# Patient Record
Sex: Female | Born: 1975 | Race: Black or African American | Hispanic: No | Marital: Single | Smoking: Never smoker
Health system: Southern US, Community
[De-identification: ages and names within clinical notes are randomized; demographics above are authoritative.]

## PROBLEM LIST (undated history)

## (undated) DIAGNOSIS — E119 Type 2 diabetes mellitus without complications: Secondary | ICD-10-CM

## (undated) DIAGNOSIS — G629 Polyneuropathy, unspecified: Secondary | ICD-10-CM

## (undated) DIAGNOSIS — I158 Other secondary hypertension: Secondary | ICD-10-CM

## (undated) DIAGNOSIS — I2510 Atherosclerotic heart disease of native coronary artery without angina pectoris: Secondary | ICD-10-CM

## (undated) DIAGNOSIS — E785 Hyperlipidemia, unspecified: Secondary | ICD-10-CM

## (undated) DIAGNOSIS — F32A Depression, unspecified: Secondary | ICD-10-CM

## (undated) DIAGNOSIS — N186 End stage renal disease: Secondary | ICD-10-CM

## (undated) DIAGNOSIS — N920 Excessive and frequent menstruation with regular cycle: Secondary | ICD-10-CM

## (undated) DIAGNOSIS — R0789 Other chest pain: Secondary | ICD-10-CM

## (undated) DIAGNOSIS — F329 Major depressive disorder, single episode, unspecified: Secondary | ICD-10-CM

## (undated) DIAGNOSIS — G4730 Sleep apnea, unspecified: Secondary | ICD-10-CM

## (undated) DIAGNOSIS — K3184 Gastroparesis: Secondary | ICD-10-CM

## (undated) DIAGNOSIS — E0590 Thyrotoxicosis, unspecified without thyrotoxic crisis or storm: Secondary | ICD-10-CM

## (undated) DIAGNOSIS — Z794 Long term (current) use of insulin: Secondary | ICD-10-CM

## (undated) DIAGNOSIS — R112 Nausea with vomiting, unspecified: Secondary | ICD-10-CM

## (undated) DIAGNOSIS — T7840XA Allergy, unspecified, initial encounter: Secondary | ICD-10-CM

## (undated) DIAGNOSIS — N189 Chronic kidney disease, unspecified: Secondary | ICD-10-CM

## (undated) DIAGNOSIS — F419 Anxiety disorder, unspecified: Secondary | ICD-10-CM

## (undated) DIAGNOSIS — Z992 Dependence on renal dialysis: Secondary | ICD-10-CM

## (undated) DIAGNOSIS — D631 Anemia in chronic kidney disease: Secondary | ICD-10-CM

## (undated) DIAGNOSIS — Z9889 Other specified postprocedural states: Secondary | ICD-10-CM

## (undated) DIAGNOSIS — N2581 Secondary hyperparathyroidism of renal origin: Secondary | ICD-10-CM

## (undated) DIAGNOSIS — K219 Gastro-esophageal reflux disease without esophagitis: Secondary | ICD-10-CM

## (undated) DIAGNOSIS — I770 Arteriovenous fistula, acquired: Secondary | ICD-10-CM

## (undated) DIAGNOSIS — Z8701 Personal history of pneumonia (recurrent): Secondary | ICD-10-CM

## (undated) DIAGNOSIS — Z01818 Encounter for other preprocedural examination: Secondary | ICD-10-CM

## (undated) HISTORY — PX: POLYPECTOMY: SHX149

## (undated) HISTORY — PX: COLONOSCOPY: SHX174

## (undated) HISTORY — DX: End stage renal disease: N18.6

## (undated) HISTORY — DX: Hyperlipidemia, unspecified: E78.5

## (undated) HISTORY — DX: Polyneuropathy, unspecified: G62.9

## (undated) HISTORY — PX: FEMUR IM NAIL: SHX1597

## (undated) HISTORY — DX: Gastro-esophageal reflux disease without esophagitis: K21.9

## (undated) HISTORY — PX: FOOT SURGERY: SHX648

## (undated) HISTORY — DX: Anxiety disorder, unspecified: F41.9

## (undated) HISTORY — PX: NEPHRECTOMY TRANSPLANTED ORGAN: SUR880

## (undated) HISTORY — DX: Allergy, unspecified, initial encounter: T78.40XA

## (undated) HISTORY — PX: CARDIOVASCULAR STRESS TEST: SHX262

## (undated) HISTORY — PX: DOBUTAMINE STRESS ECHO: SHX5426

## (undated) HISTORY — DX: Sleep apnea, unspecified: G47.30

## (undated) HISTORY — DX: Gastroparesis: K31.84

## (undated) HISTORY — DX: End stage renal disease: Z99.2

---
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
## 1997-07-15 ENCOUNTER — Inpatient Hospital Stay (HOSPITAL_COMMUNITY): Admission: AD | Admit: 1997-07-15 | Discharge: 1997-07-15 | Payer: Self-pay | Admitting: *Deleted

## 1997-10-11 ENCOUNTER — Inpatient Hospital Stay (HOSPITAL_COMMUNITY): Admission: RE | Admit: 1997-10-11 | Discharge: 1997-10-11 | Payer: Self-pay | Admitting: *Deleted

## 1998-03-24 ENCOUNTER — Inpatient Hospital Stay (HOSPITAL_COMMUNITY): Admission: AD | Admit: 1998-03-24 | Discharge: 1998-03-24 | Payer: Self-pay | Admitting: *Deleted

## 1998-05-06 ENCOUNTER — Encounter: Admission: RE | Admit: 1998-05-06 | Discharge: 1998-05-06 | Payer: Self-pay | Admitting: Internal Medicine

## 1998-06-03 ENCOUNTER — Encounter: Admission: RE | Admit: 1998-06-03 | Discharge: 1998-06-03 | Payer: Self-pay | Admitting: Internal Medicine

## 1998-06-09 ENCOUNTER — Encounter: Admission: RE | Admit: 1998-06-09 | Discharge: 1998-06-09 | Payer: Self-pay | Admitting: Internal Medicine

## 1998-06-16 ENCOUNTER — Inpatient Hospital Stay (HOSPITAL_COMMUNITY): Admission: AD | Admit: 1998-06-16 | Discharge: 1998-06-16 | Payer: Self-pay | Admitting: *Deleted

## 1998-07-09 ENCOUNTER — Encounter: Admission: RE | Admit: 1998-07-09 | Discharge: 1998-07-09 | Payer: Self-pay | Admitting: Internal Medicine

## 1998-09-16 ENCOUNTER — Encounter: Admission: RE | Admit: 1998-09-16 | Discharge: 1998-09-16 | Payer: Self-pay | Admitting: Internal Medicine

## 1998-11-19 ENCOUNTER — Emergency Department (HOSPITAL_COMMUNITY): Admission: EM | Admit: 1998-11-19 | Discharge: 1998-11-19 | Payer: Self-pay | Admitting: Emergency Medicine

## 1999-04-06 DIAGNOSIS — I10 Essential (primary) hypertension: Secondary | ICD-10-CM

## 1999-04-30 ENCOUNTER — Encounter: Admission: RE | Admit: 1999-04-30 | Discharge: 1999-04-30 | Payer: Self-pay | Admitting: Internal Medicine

## 1999-05-07 ENCOUNTER — Encounter: Admission: RE | Admit: 1999-05-07 | Discharge: 1999-05-07 | Payer: Self-pay | Admitting: Hematology and Oncology

## 1999-10-06 ENCOUNTER — Encounter: Payer: Self-pay | Admitting: Emergency Medicine

## 1999-10-06 ENCOUNTER — Emergency Department (HOSPITAL_COMMUNITY): Admission: EM | Admit: 1999-10-06 | Discharge: 1999-10-06 | Payer: Self-pay | Admitting: Emergency Medicine

## 1999-10-16 ENCOUNTER — Encounter: Admission: RE | Admit: 1999-10-16 | Discharge: 1999-10-16 | Payer: Self-pay | Admitting: Internal Medicine

## 1999-10-26 ENCOUNTER — Encounter: Admission: RE | Admit: 1999-10-26 | Discharge: 1999-10-26 | Payer: Self-pay | Admitting: Internal Medicine

## 1999-11-13 ENCOUNTER — Encounter: Admission: RE | Admit: 1999-11-13 | Discharge: 1999-11-13 | Payer: Self-pay | Admitting: Internal Medicine

## 2000-02-15 ENCOUNTER — Encounter: Admission: RE | Admit: 2000-02-15 | Discharge: 2000-02-15 | Payer: Self-pay | Admitting: Hematology and Oncology

## 2000-02-17 ENCOUNTER — Ambulatory Visit (HOSPITAL_COMMUNITY): Admission: RE | Admit: 2000-02-17 | Discharge: 2000-02-17 | Payer: Self-pay

## 2000-03-22 ENCOUNTER — Encounter: Admission: RE | Admit: 2000-03-22 | Discharge: 2000-03-22 | Payer: Self-pay | Admitting: Internal Medicine

## 2000-03-30 ENCOUNTER — Encounter: Admission: RE | Admit: 2000-03-30 | Discharge: 2000-03-30 | Payer: Self-pay | Admitting: Internal Medicine

## 2000-04-18 ENCOUNTER — Encounter: Admission: RE | Admit: 2000-04-18 | Discharge: 2000-04-18 | Payer: Self-pay | Admitting: Internal Medicine

## 2000-04-19 ENCOUNTER — Inpatient Hospital Stay (HOSPITAL_COMMUNITY): Admission: AD | Admit: 2000-04-19 | Discharge: 2000-04-19 | Payer: Self-pay | Admitting: *Deleted

## 2000-05-12 ENCOUNTER — Encounter: Payer: Self-pay | Admitting: *Deleted

## 2000-05-12 ENCOUNTER — Ambulatory Visit (HOSPITAL_COMMUNITY): Admission: AD | Admit: 2000-05-12 | Discharge: 2000-05-12 | Payer: Self-pay | Admitting: *Deleted

## 2000-05-12 ENCOUNTER — Encounter (INDEPENDENT_AMBULATORY_CARE_PROVIDER_SITE_OTHER): Payer: Self-pay | Admitting: Specialist

## 2000-05-12 HISTORY — PX: DILATION AND CURETTAGE OF UTERUS: SHX78

## 2000-05-16 ENCOUNTER — Encounter: Admission: RE | Admit: 2000-05-16 | Discharge: 2000-05-16 | Payer: Self-pay | Admitting: Internal Medicine

## 2000-06-17 ENCOUNTER — Encounter: Admission: RE | Admit: 2000-06-17 | Discharge: 2000-06-17 | Payer: Self-pay

## 2000-07-04 ENCOUNTER — Encounter: Admission: RE | Admit: 2000-07-04 | Discharge: 2000-07-04 | Payer: Self-pay | Admitting: *Deleted

## 2000-07-11 ENCOUNTER — Encounter: Admission: RE | Admit: 2000-07-11 | Discharge: 2000-07-11 | Payer: Self-pay | Admitting: Internal Medicine

## 2000-08-02 ENCOUNTER — Other Ambulatory Visit: Admission: RE | Admit: 2000-08-02 | Discharge: 2000-08-02 | Payer: Self-pay | Admitting: Obstetrics & Gynecology

## 2000-08-02 ENCOUNTER — Encounter: Admission: RE | Admit: 2000-08-02 | Discharge: 2000-08-02 | Payer: Self-pay | Admitting: Obstetrics & Gynecology

## 2000-08-03 ENCOUNTER — Encounter: Payer: Self-pay | Admitting: Emergency Medicine

## 2000-08-03 ENCOUNTER — Emergency Department (HOSPITAL_COMMUNITY): Admission: EM | Admit: 2000-08-03 | Discharge: 2000-08-03 | Payer: Self-pay

## 2000-08-15 ENCOUNTER — Encounter: Admission: RE | Admit: 2000-08-15 | Discharge: 2000-08-15 | Payer: Self-pay | Admitting: Internal Medicine

## 2000-08-31 ENCOUNTER — Emergency Department (HOSPITAL_COMMUNITY): Admission: EM | Admit: 2000-08-31 | Discharge: 2000-08-31 | Payer: Self-pay | Admitting: Emergency Medicine

## 2000-09-05 ENCOUNTER — Encounter: Admission: RE | Admit: 2000-09-05 | Discharge: 2000-09-05 | Payer: Self-pay | Admitting: Internal Medicine

## 2000-09-19 ENCOUNTER — Inpatient Hospital Stay (HOSPITAL_COMMUNITY): Admission: AD | Admit: 2000-09-19 | Discharge: 2000-09-19 | Payer: Self-pay | Admitting: Obstetrics

## 2000-09-19 ENCOUNTER — Encounter: Payer: Self-pay | Admitting: Obstetrics

## 2000-09-27 ENCOUNTER — Other Ambulatory Visit: Admission: RE | Admit: 2000-09-27 | Discharge: 2000-09-27 | Payer: Self-pay | Admitting: Obstetrics and Gynecology

## 2000-10-02 ENCOUNTER — Inpatient Hospital Stay (HOSPITAL_COMMUNITY): Admission: AD | Admit: 2000-10-02 | Discharge: 2000-10-02 | Payer: Self-pay | Admitting: Obstetrics and Gynecology

## 2000-10-03 ENCOUNTER — Inpatient Hospital Stay (HOSPITAL_COMMUNITY): Admission: AD | Admit: 2000-10-03 | Discharge: 2000-10-03 | Payer: Self-pay | Admitting: Obstetrics and Gynecology

## 2000-10-05 ENCOUNTER — Encounter: Admission: RE | Admit: 2000-10-05 | Discharge: 2000-10-05 | Payer: Self-pay | Admitting: Obstetrics & Gynecology

## 2000-10-05 ENCOUNTER — Encounter: Admission: RE | Admit: 2000-10-05 | Discharge: 2001-01-03 | Payer: Self-pay | Admitting: Obstetrics & Gynecology

## 2000-10-12 ENCOUNTER — Encounter: Admission: RE | Admit: 2000-10-12 | Discharge: 2000-10-12 | Payer: Self-pay | Admitting: Obstetrics & Gynecology

## 2000-10-19 ENCOUNTER — Encounter: Admission: RE | Admit: 2000-10-19 | Discharge: 2000-10-19 | Payer: Self-pay | Admitting: Obstetrics & Gynecology

## 2000-10-26 ENCOUNTER — Encounter: Admission: RE | Admit: 2000-10-26 | Discharge: 2000-10-26 | Payer: Self-pay | Admitting: Obstetrics & Gynecology

## 2000-10-29 ENCOUNTER — Observation Stay (HOSPITAL_COMMUNITY): Admission: AD | Admit: 2000-10-29 | Discharge: 2000-10-30 | Payer: Self-pay | Admitting: *Deleted

## 2000-11-02 ENCOUNTER — Encounter: Admission: RE | Admit: 2000-11-02 | Discharge: 2000-11-02 | Payer: Self-pay | Admitting: Obstetrics & Gynecology

## 2000-11-03 ENCOUNTER — Observation Stay (HOSPITAL_COMMUNITY): Admission: AD | Admit: 2000-11-03 | Discharge: 2000-11-04 | Payer: Self-pay | Admitting: Obstetrics & Gynecology

## 2000-11-04 ENCOUNTER — Encounter: Payer: Self-pay | Admitting: Obstetrics & Gynecology

## 2000-11-09 ENCOUNTER — Encounter: Admission: RE | Admit: 2000-11-09 | Discharge: 2000-11-09 | Payer: Self-pay | Admitting: Obstetrics & Gynecology

## 2000-11-10 ENCOUNTER — Encounter: Admission: RE | Admit: 2000-11-10 | Discharge: 2000-11-10 | Payer: Self-pay | Admitting: Obstetrics

## 2000-11-15 ENCOUNTER — Ambulatory Visit (HOSPITAL_COMMUNITY): Admission: RE | Admit: 2000-11-15 | Discharge: 2000-11-15 | Payer: Self-pay | Admitting: *Deleted

## 2000-11-16 ENCOUNTER — Encounter: Admission: RE | Admit: 2000-11-16 | Discharge: 2000-11-16 | Payer: Self-pay | Admitting: Obstetrics & Gynecology

## 2000-11-22 ENCOUNTER — Encounter: Payer: Self-pay | Admitting: *Deleted

## 2000-11-22 ENCOUNTER — Ambulatory Visit (HOSPITAL_COMMUNITY): Admission: RE | Admit: 2000-11-22 | Discharge: 2000-11-22 | Payer: Self-pay | Admitting: *Deleted

## 2000-11-23 ENCOUNTER — Encounter: Admission: RE | Admit: 2000-11-23 | Discharge: 2000-11-23 | Payer: Self-pay | Admitting: Obstetrics & Gynecology

## 2000-11-30 ENCOUNTER — Encounter: Admission: RE | Admit: 2000-11-30 | Discharge: 2000-11-30 | Payer: Self-pay | Admitting: Obstetrics & Gynecology

## 2000-12-07 ENCOUNTER — Inpatient Hospital Stay (HOSPITAL_COMMUNITY): Admission: RE | Admit: 2000-12-07 | Discharge: 2000-12-07 | Payer: Self-pay | Admitting: Obstetrics & Gynecology

## 2000-12-07 ENCOUNTER — Encounter: Admission: RE | Admit: 2000-12-07 | Discharge: 2000-12-07 | Payer: Self-pay | Admitting: Obstetrics & Gynecology

## 2000-12-14 ENCOUNTER — Encounter: Admission: RE | Admit: 2000-12-14 | Discharge: 2000-12-14 | Payer: Self-pay | Admitting: Obstetrics & Gynecology

## 2000-12-21 ENCOUNTER — Encounter: Admission: RE | Admit: 2000-12-21 | Discharge: 2000-12-21 | Payer: Self-pay | Admitting: Obstetrics & Gynecology

## 2000-12-27 ENCOUNTER — Ambulatory Visit (HOSPITAL_COMMUNITY): Admission: RE | Admit: 2000-12-27 | Discharge: 2000-12-27 | Payer: Self-pay | Admitting: Obstetrics

## 2000-12-28 ENCOUNTER — Encounter: Admission: RE | Admit: 2000-12-28 | Discharge: 2000-12-28 | Payer: Self-pay | Admitting: Obstetrics & Gynecology

## 2001-01-04 ENCOUNTER — Encounter: Admission: RE | Admit: 2001-01-04 | Discharge: 2001-01-04 | Payer: Self-pay | Admitting: Obstetrics & Gynecology

## 2001-01-11 ENCOUNTER — Encounter: Admission: RE | Admit: 2001-01-11 | Discharge: 2001-01-11 | Payer: Self-pay | Admitting: Obstetrics & Gynecology

## 2001-01-18 ENCOUNTER — Encounter: Admission: RE | Admit: 2001-01-18 | Discharge: 2001-01-18 | Payer: Self-pay | Admitting: Obstetrics & Gynecology

## 2001-01-25 ENCOUNTER — Encounter: Admission: RE | Admit: 2001-01-25 | Discharge: 2001-01-25 | Payer: Self-pay | Admitting: Obstetrics & Gynecology

## 2001-01-31 ENCOUNTER — Inpatient Hospital Stay (HOSPITAL_COMMUNITY): Admission: AD | Admit: 2001-01-31 | Discharge: 2001-03-25 | Payer: Self-pay | Admitting: *Deleted

## 2001-01-31 ENCOUNTER — Encounter (INDEPENDENT_AMBULATORY_CARE_PROVIDER_SITE_OTHER): Payer: Self-pay

## 2001-01-31 ENCOUNTER — Encounter: Payer: Self-pay | Admitting: *Deleted

## 2001-02-01 ENCOUNTER — Encounter: Admission: RE | Admit: 2001-02-01 | Discharge: 2001-02-01 | Payer: Self-pay | Admitting: Obstetrics & Gynecology

## 2001-02-16 ENCOUNTER — Encounter: Payer: Self-pay | Admitting: *Deleted

## 2001-02-16 ENCOUNTER — Encounter: Payer: Self-pay | Admitting: Obstetrics & Gynecology

## 2001-03-01 ENCOUNTER — Encounter: Payer: Self-pay | Admitting: *Deleted

## 2001-03-08 ENCOUNTER — Encounter: Payer: Self-pay | Admitting: *Deleted

## 2001-03-10 ENCOUNTER — Encounter: Payer: Self-pay | Admitting: *Deleted

## 2001-03-18 ENCOUNTER — Encounter: Payer: Self-pay | Admitting: *Deleted

## 2001-04-12 ENCOUNTER — Encounter: Admission: RE | Admit: 2001-04-12 | Discharge: 2001-04-12 | Payer: Self-pay

## 2001-06-20 ENCOUNTER — Encounter: Admission: RE | Admit: 2001-06-20 | Discharge: 2001-06-20 | Payer: Self-pay | Admitting: Internal Medicine

## 2001-08-24 ENCOUNTER — Encounter: Admission: RE | Admit: 2001-08-24 | Discharge: 2001-08-24 | Payer: Self-pay | Admitting: Internal Medicine

## 2002-01-29 ENCOUNTER — Encounter: Payer: Self-pay | Admitting: Emergency Medicine

## 2002-01-29 ENCOUNTER — Emergency Department (HOSPITAL_COMMUNITY): Admission: EM | Admit: 2002-01-29 | Discharge: 2002-01-29 | Payer: Self-pay | Admitting: Emergency Medicine

## 2002-02-27 ENCOUNTER — Encounter: Admission: RE | Admit: 2002-02-27 | Discharge: 2002-02-27 | Payer: Self-pay | Admitting: Internal Medicine

## 2002-03-06 ENCOUNTER — Encounter: Admission: RE | Admit: 2002-03-06 | Discharge: 2002-03-06 | Payer: Self-pay | Admitting: Internal Medicine

## 2002-07-23 ENCOUNTER — Ambulatory Visit (HOSPITAL_COMMUNITY): Admission: RE | Admit: 2002-07-23 | Discharge: 2002-07-23 | Payer: Self-pay | Admitting: Internal Medicine

## 2002-07-23 ENCOUNTER — Encounter: Admission: RE | Admit: 2002-07-23 | Discharge: 2002-07-23 | Payer: Self-pay | Admitting: Internal Medicine

## 2002-08-16 ENCOUNTER — Emergency Department (HOSPITAL_COMMUNITY): Admission: EM | Admit: 2002-08-16 | Discharge: 2002-08-16 | Payer: Self-pay | Admitting: Emergency Medicine

## 2003-02-14 ENCOUNTER — Encounter: Admission: RE | Admit: 2003-02-14 | Discharge: 2003-02-14 | Payer: Self-pay | Admitting: Internal Medicine

## 2003-05-27 ENCOUNTER — Encounter: Admission: RE | Admit: 2003-05-27 | Discharge: 2003-05-27 | Payer: Self-pay | Admitting: Internal Medicine

## 2004-01-17 ENCOUNTER — Ambulatory Visit: Payer: Self-pay | Admitting: Internal Medicine

## 2004-05-18 ENCOUNTER — Encounter (INDEPENDENT_AMBULATORY_CARE_PROVIDER_SITE_OTHER): Payer: Self-pay | Admitting: *Deleted

## 2004-05-18 ENCOUNTER — Ambulatory Visit: Payer: Self-pay | Admitting: Internal Medicine

## 2004-05-19 ENCOUNTER — Encounter (INDEPENDENT_AMBULATORY_CARE_PROVIDER_SITE_OTHER): Payer: Self-pay | Admitting: *Deleted

## 2004-08-12 ENCOUNTER — Emergency Department (HOSPITAL_COMMUNITY): Admission: EM | Admit: 2004-08-12 | Discharge: 2004-08-12 | Payer: Self-pay | Admitting: Emergency Medicine

## 2004-08-18 ENCOUNTER — Ambulatory Visit: Payer: Self-pay | Admitting: Internal Medicine

## 2005-01-22 ENCOUNTER — Ambulatory Visit: Payer: Self-pay | Admitting: Internal Medicine

## 2005-01-31 ENCOUNTER — Emergency Department (HOSPITAL_COMMUNITY): Admission: EM | Admit: 2005-01-31 | Discharge: 2005-01-31 | Payer: Self-pay | Admitting: Emergency Medicine

## 2005-07-13 ENCOUNTER — Emergency Department (HOSPITAL_COMMUNITY): Admission: EM | Admit: 2005-07-13 | Discharge: 2005-07-13 | Payer: Self-pay | Admitting: Family Medicine

## 2005-10-15 ENCOUNTER — Other Ambulatory Visit: Admission: RE | Admit: 2005-10-15 | Discharge: 2005-10-15 | Payer: Self-pay | Admitting: Family Medicine

## 2005-10-29 ENCOUNTER — Encounter: Admission: RE | Admit: 2005-10-29 | Discharge: 2005-10-29 | Payer: Self-pay | Admitting: Nephrology

## 2006-01-13 ENCOUNTER — Encounter: Admission: RE | Admit: 2006-01-13 | Discharge: 2006-01-13 | Payer: Self-pay | Admitting: Nephrology

## 2006-01-28 ENCOUNTER — Encounter: Admission: RE | Admit: 2006-01-28 | Discharge: 2006-01-28 | Payer: Self-pay | Admitting: Orthopaedic Surgery

## 2006-02-08 ENCOUNTER — Encounter: Admission: RE | Admit: 2006-02-08 | Discharge: 2006-02-08 | Payer: Self-pay | Admitting: Orthopaedic Surgery

## 2006-02-16 ENCOUNTER — Emergency Department (HOSPITAL_COMMUNITY): Admission: EM | Admit: 2006-02-16 | Discharge: 2006-02-17 | Payer: Self-pay | Admitting: Emergency Medicine

## 2006-03-21 ENCOUNTER — Encounter: Admission: RE | Admit: 2006-03-21 | Discharge: 2006-03-21 | Payer: Self-pay | Admitting: Orthopaedic Surgery

## 2006-03-24 DIAGNOSIS — J45909 Unspecified asthma, uncomplicated: Secondary | ICD-10-CM | POA: Insufficient documentation

## 2006-03-24 DIAGNOSIS — Z8659 Personal history of other mental and behavioral disorders: Secondary | ICD-10-CM | POA: Insufficient documentation

## 2006-03-24 DIAGNOSIS — G44209 Tension-type headache, unspecified, not intractable: Secondary | ICD-10-CM

## 2006-05-11 ENCOUNTER — Encounter: Admission: RE | Admit: 2006-05-11 | Discharge: 2006-07-11 | Payer: Self-pay | Admitting: Orthopaedic Surgery

## 2006-07-31 ENCOUNTER — Emergency Department (HOSPITAL_COMMUNITY): Admission: EM | Admit: 2006-07-31 | Discharge: 2006-08-01 | Payer: Self-pay | Admitting: Emergency Medicine

## 2006-08-08 ENCOUNTER — Emergency Department (HOSPITAL_COMMUNITY): Admission: EM | Admit: 2006-08-08 | Discharge: 2006-08-08 | Payer: Self-pay | Admitting: Emergency Medicine

## 2006-08-09 ENCOUNTER — Ambulatory Visit: Payer: Self-pay | Admitting: Cardiovascular Disease

## 2006-08-30 ENCOUNTER — Encounter: Payer: Self-pay | Admitting: Cardiovascular Disease

## 2006-08-30 ENCOUNTER — Ambulatory Visit: Payer: Self-pay | Admitting: Cardiovascular Disease

## 2006-08-30 ENCOUNTER — Ambulatory Visit: Payer: Self-pay

## 2007-04-12 ENCOUNTER — Observation Stay (HOSPITAL_COMMUNITY): Admission: EM | Admit: 2007-04-12 | Discharge: 2007-04-13 | Payer: Self-pay | Admitting: Emergency Medicine

## 2007-05-16 ENCOUNTER — Other Ambulatory Visit: Admission: RE | Admit: 2007-05-16 | Discharge: 2007-05-16 | Payer: Self-pay | Admitting: Family Medicine

## 2007-09-04 LAB — CONVERTED CEMR LAB: Pap Smear: NORMAL

## 2008-08-08 ENCOUNTER — Telehealth (INDEPENDENT_AMBULATORY_CARE_PROVIDER_SITE_OTHER): Payer: Self-pay | Admitting: *Deleted

## 2008-08-08 ENCOUNTER — Ambulatory Visit: Payer: Self-pay | Admitting: Internal Medicine

## 2008-08-08 DIAGNOSIS — Z9189 Other specified personal risk factors, not elsewhere classified: Secondary | ICD-10-CM | POA: Insufficient documentation

## 2008-08-08 DIAGNOSIS — E785 Hyperlipidemia, unspecified: Secondary | ICD-10-CM

## 2008-08-08 DIAGNOSIS — K219 Gastro-esophageal reflux disease without esophagitis: Secondary | ICD-10-CM | POA: Insufficient documentation

## 2008-08-08 DIAGNOSIS — E669 Obesity, unspecified: Secondary | ICD-10-CM

## 2008-08-09 ENCOUNTER — Encounter (INDEPENDENT_AMBULATORY_CARE_PROVIDER_SITE_OTHER): Payer: Self-pay | Admitting: *Deleted

## 2008-08-09 LAB — CONVERTED CEMR LAB
CO2: 29 meq/L (ref 19–32)
Chloride: 101 meq/L (ref 96–112)
Cholesterol: 241 mg/dL — ABNORMAL HIGH (ref 0–200)
Creatinine,U: 124.9 mg/dL
Direct LDL: 170.4 mg/dL
Hgb A1c MFr Bld: 10.4 % — ABNORMAL HIGH (ref 4.6–6.5)
Microalb Creat Ratio: 639.7 mg/g — ABNORMAL HIGH (ref 0.0–30.0)
Potassium: 3.8 meq/L (ref 3.5–5.1)
Sodium: 136 meq/L (ref 135–145)

## 2008-08-13 ENCOUNTER — Emergency Department (HOSPITAL_COMMUNITY): Admission: EM | Admit: 2008-08-13 | Discharge: 2008-08-13 | Payer: Self-pay | Admitting: Emergency Medicine

## 2008-08-21 ENCOUNTER — Ambulatory Visit: Payer: Self-pay | Admitting: Endocrinology

## 2008-08-22 ENCOUNTER — Ambulatory Visit: Payer: Self-pay | Admitting: Internal Medicine

## 2008-08-22 ENCOUNTER — Encounter (INDEPENDENT_AMBULATORY_CARE_PROVIDER_SITE_OTHER): Payer: Self-pay | Admitting: *Deleted

## 2008-08-26 ENCOUNTER — Telehealth: Payer: Self-pay | Admitting: Internal Medicine

## 2008-10-22 ENCOUNTER — Encounter (INDEPENDENT_AMBULATORY_CARE_PROVIDER_SITE_OTHER): Payer: Self-pay | Admitting: *Deleted

## 2008-12-08 ENCOUNTER — Inpatient Hospital Stay (HOSPITAL_COMMUNITY): Admission: EM | Admit: 2008-12-08 | Discharge: 2008-12-10 | Payer: Self-pay | Admitting: Emergency Medicine

## 2008-12-08 ENCOUNTER — Encounter: Payer: Self-pay | Admitting: Internal Medicine

## 2008-12-08 LAB — CONVERTED CEMR LAB
AST: 18 units/L
Albumin: 2.1 g/dL
Alkaline Phosphatase: 84 units/L
BUN: 9 mg/dL
Creatinine, Ser: 1.2 mg/dL
Glucose, Bld: 376 mg/dL
Hemoglobin: 12.6 g/dL
Lymphocytes, automated: 26 %
MCV: 79.7 fL
Monocytes Relative: 9 %
Potassium: 3.6 meq/L
Sodium: 136 meq/L
Total Protein: 5.5 g/dL
WBC: 6.5 10*3/uL

## 2008-12-09 ENCOUNTER — Encounter: Payer: Self-pay | Admitting: Internal Medicine

## 2008-12-09 ENCOUNTER — Encounter (INDEPENDENT_AMBULATORY_CARE_PROVIDER_SITE_OTHER): Payer: Self-pay | Admitting: Internal Medicine

## 2008-12-09 LAB — CONVERTED CEMR LAB
TSH: 3.894 microintl units/mL
Triglyceride fasting, serum: 143 mg/dL

## 2008-12-10 ENCOUNTER — Encounter: Payer: Self-pay | Admitting: Internal Medicine

## 2008-12-10 LAB — CONVERTED CEMR LAB
CO2: 25 meq/L
Creatinine, Ser: 1.06 mg/dL
Glucose, Bld: 151 mg/dL
HCT: 35.8 %
Potassium: 2.9 meq/L
RDW: 13.6 %
Sodium: 141 meq/L
WBC: 5.3 10*3/uL

## 2008-12-18 ENCOUNTER — Encounter: Payer: Self-pay | Admitting: Internal Medicine

## 2008-12-19 ENCOUNTER — Encounter (INDEPENDENT_AMBULATORY_CARE_PROVIDER_SITE_OTHER): Payer: Self-pay | Admitting: *Deleted

## 2008-12-19 ENCOUNTER — Ambulatory Visit: Payer: Self-pay | Admitting: Internal Medicine

## 2008-12-19 DIAGNOSIS — E876 Hypokalemia: Secondary | ICD-10-CM

## 2008-12-19 DIAGNOSIS — E875 Hyperkalemia: Secondary | ICD-10-CM | POA: Insufficient documentation

## 2008-12-21 DIAGNOSIS — Z9089 Acquired absence of other organs: Secondary | ICD-10-CM

## 2008-12-21 DIAGNOSIS — Z940 Kidney transplant status: Secondary | ICD-10-CM | POA: Insufficient documentation

## 2008-12-30 ENCOUNTER — Telehealth: Payer: Self-pay | Admitting: Internal Medicine

## 2009-01-30 ENCOUNTER — Ambulatory Visit: Payer: Self-pay | Admitting: Endocrinology

## 2009-02-04 ENCOUNTER — Encounter: Payer: Self-pay | Admitting: Endocrinology

## 2009-02-25 ENCOUNTER — Ambulatory Visit: Payer: Self-pay | Admitting: Endocrinology

## 2009-03-21 ENCOUNTER — Ambulatory Visit: Payer: Self-pay | Admitting: Internal Medicine

## 2009-05-14 ENCOUNTER — Emergency Department (HOSPITAL_COMMUNITY): Admission: EM | Admit: 2009-05-14 | Discharge: 2009-05-15 | Payer: Self-pay | Admitting: Emergency Medicine

## 2009-06-30 ENCOUNTER — Telehealth (INDEPENDENT_AMBULATORY_CARE_PROVIDER_SITE_OTHER): Payer: Self-pay | Admitting: *Deleted

## 2009-07-28 ENCOUNTER — Ambulatory Visit: Payer: Self-pay | Admitting: Endocrinology

## 2009-07-28 LAB — CONVERTED CEMR LAB: Hgb A1c MFr Bld: 9.5 % — ABNORMAL HIGH (ref 4.6–6.5)

## 2009-09-16 ENCOUNTER — Telehealth: Payer: Self-pay | Admitting: Endocrinology

## 2009-11-17 ENCOUNTER — Ambulatory Visit: Payer: Self-pay | Admitting: Internal Medicine

## 2009-12-08 ENCOUNTER — Emergency Department (HOSPITAL_COMMUNITY): Admission: EM | Admit: 2009-12-08 | Discharge: 2009-12-08 | Payer: Self-pay | Admitting: Emergency Medicine

## 2009-12-08 ENCOUNTER — Emergency Department (HOSPITAL_COMMUNITY): Admission: EM | Admit: 2009-12-08 | Discharge: 2009-12-09 | Payer: Self-pay | Admitting: Emergency Medicine

## 2010-03-11 ENCOUNTER — Inpatient Hospital Stay (HOSPITAL_COMMUNITY)
Admission: EM | Admit: 2010-03-11 | Discharge: 2010-03-12 | Payer: Self-pay | Source: Home / Self Care | Attending: Internal Medicine | Admitting: Internal Medicine

## 2010-03-12 ENCOUNTER — Encounter (INDEPENDENT_AMBULATORY_CARE_PROVIDER_SITE_OTHER): Payer: Self-pay | Admitting: Internal Medicine

## 2010-04-03 ENCOUNTER — Ambulatory Visit: Payer: Self-pay | Admitting: Physician Assistant

## 2010-04-10 ENCOUNTER — Encounter: Payer: Self-pay | Admitting: Physician Assistant

## 2010-04-13 ENCOUNTER — Ambulatory Visit: Admit: 2010-04-13 | Payer: Self-pay | Admitting: Physician Assistant

## 2010-04-15 ENCOUNTER — Telehealth: Payer: Self-pay | Admitting: Endocrinology

## 2010-04-24 ENCOUNTER — Encounter (INDEPENDENT_AMBULATORY_CARE_PROVIDER_SITE_OTHER): Payer: Self-pay | Admitting: *Deleted

## 2010-05-05 NOTE — Progress Notes (Signed)
Summary: Rx refill req  Phone Note Call from Patient   Caller: Patient [phone] Summary of Call: pt called requesting refill of Insulin Initial call taken by: Crissie Sickles, [place],  September 16, 2009 2:26 PM    Prescriptions: LEVEMIR 100 UNIT/ML SOLN (INSULIN DETEMIR) 90 units each am  #3 mth x 3   Entered by:   Crissie Sickles, CMA   Authorized by:   Donavan Foil MD   Signed by:   Crissie Sickles, CMA on 09/16/2009   Method used:   Electronically to        [facility] (retail)       [phone]       [place], [place]  [address]       Ph: [phone]       Fax: [phone]   RxID:   [id number]

## 2010-05-05 NOTE — Progress Notes (Signed)
  Phone Note Other Incoming   Caller: pt  Summary of Call: Pt called requesting samples of levimir, I called pt to inform her that we do have samples and that I would put some back for her. They are in the fridge on side A Initial call taken by: [place],  June 30, 2009 10:04 AM

## 2010-05-05 NOTE — Assessment & Plan Note (Signed)
Summary: FU Linda Ellison   PER TRIAGE   Vital Signs:  Patient profile:   35 year old female Menstrual status:  regular Height:      66 inches (167.64 cm) Weight:      232.13 pounds (105.51 kg) BMI:     37.60 O2 Sat:      98 % on Room air Temp:     98.0 degrees F (36.67 degrees C) oral Pulse rate:   102 / minute BP sitting:   172 / 118  (left arm) Cuff size:   large  Vitals Entered By: Gardenia Phlegm RMA (July 28, 2009 4:31 PM)  O2 Flow:  Room air CC: Follow-up visit/ pt states she is no longer taking Clonidine/ CF Is Patient Diabetic? Yes   Primary Provider:  Rowe Clack MD  CC:  Follow-up visit/ pt states she is no longer taking Clonidine/ CF.  History of Present Illness: no cbg record, but states cbg's are well-controlled.  she likes the levemir the best of any insulin schedule she has been on.  pt states she feels well in general. pt says dr webb stopped her catapres.     Current Medications (verified): 1)  Albuterol 90 Mcg/act Aers (Albuterol) .... Inhale 1 Puff Every Four Hours As Needed 2)  Flovent Diskus  Aepb (Fluticasone Propionate (Inhal) Aepb) .... Inhale 2 Puffs Every Twelve Hours As Needed 3)  Rena-Vite  Tabs (B Complex-C-Folic Acid) .... Take 1 By Mouth Qd 4)  Vitamin D 50000 Unit  Caps (Ergocalciferol) .... Take 1 Q Month 5)  Calcium 600/vitamin D 600-400 Mg-Unit Tabs (Calcium Carbonate-Vitamin D) .... Take 1 By Mouth Qd 6)  Clonidine Hcl 0.1 Mg Tabs (Clonidine Hcl) .... Take 1 Two Times A Day 7)  Lisinopril-Hydrochlorothiazide 10-12.5 Mg Tabs (Lisinopril-Hydrochlorothiazide) .... Take 1 Two Times A Day 8)  Simvastatin 40 Mg Tabs (Simvastatin) .... Take 1 At Bedtime 9)  Levemir 100 Unit/ml Soln (Insulin Detemir) .... 80 Units Each Am  Allergies (verified): No Known Drug Allergies  Past History:  Past Medical History: Last updated: 12/21/2008 GERD noncompliance DIABETES MELLITUS, TYPE II (ICD-250.00) HYPERLIPIDEMIA (ICD-272.4) HYPERTENSION  (ICD-401.9) HYPOKALEMIA (ICD-276.8) OBESITY, UNSPECIFIED (ICD-278.00) CHRONIC KIDNEY DISEASE UNSPECIFIED (ICD-585.9) FAMILY HISTORY DIABETES 1ST DEGREE RELATIVE (ICD-V18.0) CHICKENPOX, HX OF (ICD-V15.9) ASTHMA (ICD-493.90) BRONCHITIS, ALLERGIC (ICD-493.90) TENSION HEADACHE (ICD-307.81) DEPRESSION, HX OF (ICD-V11.8)    Review of Systems  The patient denies hypoglycemia.    Physical Exam  General:  normal appearance and obese.   Pulses:  dorsalis pedis intact bilat.    Extremities:  no deformity.  no ulcer on the feet.  feet are of normal color and temp.  trace right pedal edema and trace left pedal edema. Neurologic:  sensation is intact to touch on the feet    Impression & Recommendations:  Problem # 1:  DIABETES MELLITUS, TYPE II (ICD-250.00) needs increased rx.  Problem # 2:  HYPERTENSION (ICD-401.9)  Medications Added to Medication List This Visit: 1)  Levemir 100 Unit/ml Soln (Insulin detemir) .... 90 units each am  Other Orders: TLB-A1C / Hgb A1C (Glycohemoglobin) (83036-A1C) Est. Patient Level III [id number])  Patient Instructions: 1)  check your blood glucose 1 time a day.  vary the time of day between before the 3 meals and at bedtime.  also check if you feel as though your glucose might be very high or too low.  bring a record of this to your doctor appointments. 2)  tests are being ordered for you today.  a few days  after the test(s), please call [phone] to hear your test results. 3)  pending the test results, please continue levemir 80 units each am. 4)  Please schedule a follow-up appointment in 3 months. 5)  please see dr webb soon to recheck your blood pressure.   6)  (update: i left message on phone-tree: increase levemir to 90 units each am).

## 2010-05-07 NOTE — Miscellaneous (Signed)
  Clinical Lists Changes  Observations: Added new observation of ECHOINTERP:  Left ventricle: Systolic function was normal. The estimated ejection   fraction was in the range of 55% to 60%.  (03/12/2010 14:25)      Echocardiogram  Procedure date:  03/12/2010  Findings:       Left ventricle: Systolic function was normal. The estimated ejection   fraction was in the range of 55% to 60%.

## 2010-05-07 NOTE — Letter (Signed)
Summary: Generic Letter  [facility]   [place], [place] [address]   Phone: [phone]  Fax: [phone]    04/24/2010  [facility] [address] [place], [place]  [address]  Dear Ms. MENSCHING,   According to our records, you haven't been seen by Dr. Loanne Drilling since April of 2011. Dr. Loanne Drilling would like you to schedule a follow up office visit in order to manage your diabetes. Please contact our office at [phone] to schedule an appointment at your convenience.         Sincerely,   Rebeca Alert CMA ([place])

## 2010-05-07 NOTE — Progress Notes (Signed)
Summary: OV due  Phone Note Outgoing Call Call back at [facility] Phone [phone] Call back at Work Phone [phone] Call back at [phone]   Call placed by: Rebeca Alert CMA Deborra Medina),  April 15, 2010 11:40 AM Call placed to: Patient Summary of Call: Per [facility]E, pt is due for F/U OV. Unable to reach pt at numbers listed above at this present time, all numbers are currently disconnected  Follow-up for Phone Call        letter sent to pt's address on file advising to schedule OV with SAE Follow-up by: Rebeca Alert CMA Deborra Medina),  April 24, 2010 4:44 PM

## 2010-05-13 ENCOUNTER — Encounter (INDEPENDENT_AMBULATORY_CARE_PROVIDER_SITE_OTHER): Payer: Self-pay | Admitting: Nurse Practitioner

## 2010-05-13 LAB — CONVERTED CEMR LAB: Hgb A1c MFr Bld: 14.5 %

## 2010-06-08 ENCOUNTER — Inpatient Hospital Stay (HOSPITAL_COMMUNITY)
Admission: EM | Admit: 2010-06-08 | Discharge: 2010-06-12 | DRG: 638 | Disposition: A | Discharge status: Home / Self Care | Payer: Medicaid Other | Attending: Internal Medicine | Admitting: Internal Medicine

## 2010-06-08 ENCOUNTER — Emergency Department (HOSPITAL_COMMUNITY): Payer: Medicaid Other

## 2010-06-08 ENCOUNTER — Encounter (INDEPENDENT_AMBULATORY_CARE_PROVIDER_SITE_OTHER): Payer: Self-pay | Admitting: Nurse Practitioner

## 2010-06-08 DIAGNOSIS — I129 Hypertensive chronic kidney disease with stage 1 through stage 4 chronic kidney disease, or unspecified chronic kidney disease: Secondary | ICD-10-CM | POA: Diagnosis present

## 2010-06-08 DIAGNOSIS — Z8249 Family history of ischemic heart disease and other diseases of the circulatory system: Secondary | ICD-10-CM

## 2010-06-08 DIAGNOSIS — Z79899 Other long term (current) drug therapy: Secondary | ICD-10-CM

## 2010-06-08 DIAGNOSIS — N179 Acute kidney failure, unspecified: Secondary | ICD-10-CM | POA: Diagnosis present

## 2010-06-08 DIAGNOSIS — Z794 Long term (current) use of insulin: Secondary | ICD-10-CM

## 2010-06-08 DIAGNOSIS — N182 Chronic kidney disease, stage 2 (mild): Secondary | ICD-10-CM | POA: Diagnosis present

## 2010-06-08 DIAGNOSIS — Z7982 Long term (current) use of aspirin: Secondary | ICD-10-CM

## 2010-06-08 DIAGNOSIS — IMO0002 Reserved for concepts with insufficient information to code with codable children: Principal | ICD-10-CM | POA: Diagnosis present

## 2010-06-08 DIAGNOSIS — J45909 Unspecified asthma, uncomplicated: Secondary | ICD-10-CM | POA: Diagnosis present

## 2010-06-08 DIAGNOSIS — E1065 Type 1 diabetes mellitus with hyperglycemia: Principal | ICD-10-CM | POA: Diagnosis present

## 2010-06-08 DIAGNOSIS — R339 Retention of urine, unspecified: Secondary | ICD-10-CM | POA: Diagnosis present

## 2010-06-08 DIAGNOSIS — D6489 Other specified anemias: Secondary | ICD-10-CM | POA: Diagnosis present

## 2010-06-08 LAB — POCT I-STAT, CHEM 8
Calcium, Ion: 1.13 mmol/L (ref 1.12–1.32)
Glucose, Bld: 494 mg/dL — ABNORMAL HIGH (ref 70–99)
HCT: 35 % — ABNORMAL LOW (ref 36.0–46.0)
Hemoglobin: 11.9 g/dL — ABNORMAL LOW (ref 12.0–15.0)
Potassium: 3.7 mEq/L (ref 3.5–5.1)

## 2010-06-08 LAB — URINE MICROSCOPIC-ADD ON

## 2010-06-08 LAB — HEPATIC FUNCTION PANEL
ALT: 13 U/L (ref 0–35)
AST: 12 U/L (ref 0–37)
Albumin: 2.2 g/dL — ABNORMAL LOW (ref 3.5–5.2)
Albumin: 1.9 g/dL — ABNORMAL LOW (ref 3.5–5.2)
Alkaline Phosphatase: 82 U/L (ref 39–117)
Total Bilirubin: 0.3 mg/dL (ref 0.3–1.2)
Total Protein: 5.5 g/dL — ABNORMAL LOW (ref 6.0–8.3)
Total Protein: 5 g/dL — ABNORMAL LOW (ref 6.0–8.3)

## 2010-06-08 LAB — DIFFERENTIAL
Basophils Relative: 0 % (ref 0–1)
Eosinophils Absolute: 0.1 10*3/uL (ref 0.0–0.7)
Neutrophils Relative %: 73 % (ref 43–77)

## 2010-06-08 LAB — CONVERTED CEMR LAB
AST: 11 units/L
Albumin: 2.2 g/dL
Alkaline Phosphatase: 82 units/L
Basophils Absolute: 0 10*3/uL
Basophils Relative: 0 %
Bilirubin Urine: NEGATIVE
Chloride: 97 meq/L
Creatinine, Ser: 2.42 mg/dL
Eosinophils Absolute: 0.1 10*3/uL
MCHC: 32.8 g/dL
MCV: 76.2 fL
Monocytes Relative: 7 %
Neutrophils Relative %: 73 %
Nitrite: NEGATIVE
Platelets: 299 10*3/uL
RDW: 12.8 %
TSH: 4.233 microintl units/mL
Urobilinogen, UA: 0.2

## 2010-06-08 LAB — BASIC METABOLIC PANEL
BUN: 17 mg/dL (ref 6–23)
CO2: 24 mEq/L (ref 19–32)
Calcium: 8 mg/dL — ABNORMAL LOW (ref 8.4–10.5)
Chloride: 107 mEq/L (ref 96–112)
Creatinine, Ser: 2.42 mg/dL — ABNORMAL HIGH (ref 0.4–1.2)
GFR calc Af Amer: 28 mL/min — ABNORMAL LOW (ref >60)
Potassium: 2.6 mEq/L — CL (ref 3.5–5.1)
Sodium: 132 mEq/L — ABNORMAL LOW (ref 135–145)

## 2010-06-08 LAB — GLUCOSE, CAPILLARY
Glucose-Capillary: 404 mg/dL — ABNORMAL HIGH (ref 70–99)
Glucose-Capillary: 223 mg/dL — ABNORMAL HIGH (ref 70–99)
Glucose-Capillary: 152 mg/dL — ABNORMAL HIGH (ref 70–99)
Glucose-Capillary: 131 mg/dL — ABNORMAL HIGH (ref 70–99)

## 2010-06-08 LAB — LIPASE, BLOOD: Lipase: 50 U/L (ref 11–59)

## 2010-06-08 LAB — POCT PREGNANCY, URINE: Preg Test, Ur: NEGATIVE

## 2010-06-08 LAB — URINALYSIS, ROUTINE W REFLEX MICROSCOPIC
Leukocytes, UA: NEGATIVE
Nitrite: NEGATIVE
Specific Gravity, Urine: 1.025 (ref 1.005–1.030)
pH: 7 (ref 5.0–8.0)

## 2010-06-08 LAB — CARDIAC PANEL(CRET KIN+CKTOT+MB+TROPI)
CK, MB: 0.5 ng/mL (ref 0.3–4.0)
CK, MB: 0.5 ng/mL (ref 0.3–4.0)
Relative Index: INVALID (ref 0.0–2.5)
Relative Index: 0.5 (ref 0.0–2.5)
Total CK: 89 U/L (ref 7–177)
Total CK: 105 U/L (ref 7–177)
Troponin I: 0.02 ng/mL (ref 0.00–0.06)

## 2010-06-08 LAB — CBC
Hemoglobin: 10.5 g/dL — ABNORMAL LOW (ref 12.0–15.0)
Platelets: 299 10*3/uL (ref 150–400)
Platelets: 280 10*3/uL (ref 150–400)
RBC: 4.53 MIL/uL (ref 3.87–5.11)
RBC: 4.17 MIL/uL (ref 3.87–5.11)
WBC: 7.8 10*3/uL (ref 4.0–10.5)
WBC: 8.1 10*3/uL (ref 4.0–10.5)

## 2010-06-08 LAB — POCT CARDIAC MARKERS: CKMB, poc: <1 ng/mL — ABNORMAL LOW (ref 1.0–8.0)

## 2010-06-08 LAB — MAGNESIUM: Magnesium: 1.7 mg/dL (ref 1.5–2.5)

## 2010-06-09 LAB — GLUCOSE, CAPILLARY
Glucose-Capillary: 199 mg/dL — ABNORMAL HIGH (ref 70–99)
Glucose-Capillary: 192 mg/dL — ABNORMAL HIGH (ref 70–99)
Glucose-Capillary: 167 mg/dL — ABNORMAL HIGH (ref 70–99)

## 2010-06-09 LAB — URINE CULTURE
Colony Count: NO GROWTH
Colony Count: NO GROWTH
Culture  Setup Time: 201203050855
Culture: NO GROWTH

## 2010-06-09 LAB — CBC
HCT: 34.5 % — ABNORMAL LOW (ref 36.0–46.0)
MCHC: 33 g/dL (ref 30.0–36.0)
Platelets: 221 10*3/uL (ref 150–400)
RDW: 13.4 % (ref 11.5–15.5)
WBC: 8.9 10*3/uL (ref 4.0–10.5)

## 2010-06-09 LAB — BASIC METABOLIC PANEL
BUN: 14 mg/dL (ref 6–23)
CO2: 20 mEq/L (ref 19–32)
Chloride: 110 mEq/L (ref 96–112)
GFR calc non Af Amer: 26 mL/min — ABNORMAL LOW (ref >60)
Glucose, Bld: 203 mg/dL — ABNORMAL HIGH (ref 70–99)
Potassium: 4.1 mEq/L (ref 3.5–5.1)
Sodium: 138 mEq/L (ref 135–145)

## 2010-06-10 LAB — BASIC METABOLIC PANEL
CO2: 25 mEq/L (ref 19–32)
Calcium: 8 mg/dL — ABNORMAL LOW (ref 8.4–10.5)
Creatinine, Ser: 2.26 mg/dL — ABNORMAL HIGH (ref 0.4–1.2)
GFR calc Af Amer: 30 mL/min — ABNORMAL LOW (ref >60)
GFR calc non Af Amer: 25 mL/min — ABNORMAL LOW (ref >60)
Glucose, Bld: 106 mg/dL — ABNORMAL HIGH (ref 70–99)

## 2010-06-10 LAB — GLUCOSE, CAPILLARY
Glucose-Capillary: 89 mg/dL (ref 70–99)
Glucose-Capillary: 110 mg/dL — ABNORMAL HIGH (ref 70–99)
Glucose-Capillary: 185 mg/dL — ABNORMAL HIGH (ref 70–99)

## 2010-06-10 LAB — CBC
HCT: 34.2 % — ABNORMAL LOW (ref 36.0–46.0)
Hemoglobin: 10.9 g/dL — ABNORMAL LOW (ref 12.0–15.0)
MCH: 25.1 pg — ABNORMAL LOW (ref 26.0–34.0)
MCHC: 31.9 g/dL (ref 30.0–36.0)
RDW: 13.5 % (ref 11.5–15.5)

## 2010-06-11 ENCOUNTER — Encounter (INDEPENDENT_AMBULATORY_CARE_PROVIDER_SITE_OTHER): Payer: Self-pay | Admitting: Nurse Practitioner

## 2010-06-11 ENCOUNTER — Telehealth (INDEPENDENT_AMBULATORY_CARE_PROVIDER_SITE_OTHER): Payer: Self-pay | Admitting: Nurse Practitioner

## 2010-06-11 LAB — GLUCOSE, CAPILLARY
Glucose-Capillary: 124 mg/dL — ABNORMAL HIGH (ref 70–99)
Glucose-Capillary: 188 mg/dL — ABNORMAL HIGH (ref 70–99)

## 2010-06-11 LAB — BASIC METABOLIC PANEL
GFR calc non Af Amer: 24 mL/min — ABNORMAL LOW (ref >60)
Potassium: 3.6 mEq/L (ref 3.5–5.1)
Sodium: 137 mEq/L (ref 135–145)

## 2010-06-12 LAB — BASIC METABOLIC PANEL
CO2: 23 mEq/L (ref 19–32)
Chloride: 109 mEq/L (ref 96–112)
GFR calc non Af Amer: 24 mL/min — ABNORMAL LOW (ref >60)
Glucose, Bld: 174 mg/dL — ABNORMAL HIGH (ref 70–99)
Potassium: 3.3 mEq/L — ABNORMAL LOW (ref 3.5–5.1)
Sodium: 136 mEq/L (ref 135–145)

## 2010-06-12 LAB — GLUCOSE, CAPILLARY
Glucose-Capillary: 79 mg/dL (ref 70–99)
Glucose-Capillary: 205 mg/dL — ABNORMAL HIGH (ref 70–99)

## 2010-06-15 LAB — CARDIAC PANEL(CRET KIN+CKTOT+MB+TROPI)
CK, MB: 0.3 ng/mL (ref 0.3–4.0)
CK, MB: 0.5 ng/mL (ref 0.3–4.0)
Total CK: 36 U/L (ref 7–177)
Total CK: 79 U/L (ref 7–177)
Troponin I: 0.01 ng/mL (ref 0.00–0.06)

## 2010-06-15 LAB — COMPREHENSIVE METABOLIC PANEL
ALT: <8 U/L (ref 0–35)
AST: 10 U/L (ref 0–37)
Albumin: 1.2 g/dL — ABNORMAL LOW (ref 3.5–5.2)
Alkaline Phosphatase: 41 U/L (ref 39–117)
Calcium: 5.1 mg/dL — CL (ref 8.4–10.5)
GFR calc Af Amer: >60 mL/min (ref >60)
Glucose, Bld: 126 mg/dL — ABNORMAL HIGH (ref 70–99)
Potassium: 2 mEq/L — CL (ref 3.5–5.1)
Sodium: 144 mEq/L (ref 135–145)
Total Protein: 3.4 g/dL — ABNORMAL LOW (ref 6.0–8.3)

## 2010-06-15 LAB — POCT I-STAT, CHEM 8
BUN: 20 mg/dL (ref 6–23)
Calcium, Ion: 1.03 mmol/L — ABNORMAL LOW (ref 1.12–1.32)
Creatinine, Ser: 1.8 mg/dL — ABNORMAL HIGH (ref 0.4–1.2)
Glucose, Bld: 566 mg/dL (ref 70–99)
Hemoglobin: 12.9 g/dL (ref 12.0–15.0)
Sodium: 133 mEq/L — ABNORMAL LOW (ref 135–145)
TCO2: 23 mmol/L (ref 0–100)

## 2010-06-15 LAB — DIFFERENTIAL
Eosinophils Relative: 0 % (ref 0–5)
Monocytes Absolute: 0.4 10*3/uL (ref 0.1–1.0)
Neutro Abs: 3.9 10*3/uL (ref 1.7–7.7)

## 2010-06-15 LAB — URINE CULTURE: Culture  Setup Time: 201112072301

## 2010-06-15 LAB — GLUCOSE, CAPILLARY
Glucose-Capillary: 169 mg/dL — ABNORMAL HIGH (ref 70–99)
Glucose-Capillary: 196 mg/dL — ABNORMAL HIGH (ref 70–99)
Glucose-Capillary: 323 mg/dL — ABNORMAL HIGH (ref 70–99)
Glucose-Capillary: 88 mg/dL (ref 70–99)

## 2010-06-15 LAB — URINALYSIS, ROUTINE W REFLEX MICROSCOPIC
Glucose, UA: >1000 mg/dL — AB
Ketones, ur: NEGATIVE mg/dL
Leukocytes, UA: NEGATIVE
Nitrite: NEGATIVE
Protein, ur: >300 mg/dL — AB
pH: 6 (ref 5.0–8.0)

## 2010-06-15 LAB — POCT I-STAT 3, VENOUS BLOOD GAS (G3P V)
Bicarbonate: 23.7 mEq/L (ref 20.0–24.0)
TCO2: 25 mmol/L (ref 0–100)
pCO2, Ven: 40.2 mmHg — ABNORMAL LOW (ref 45.0–50.0)
pH, Ven: 7.379 — ABNORMAL HIGH (ref 7.250–7.300)
pO2, Ven: 41 mmHg (ref 30.0–45.0)

## 2010-06-15 LAB — BASIC METABOLIC PANEL
BUN: 19 mg/dL (ref 6–23)
CO2: 23 mEq/L (ref 19–32)
Calcium: 7.4 mg/dL — ABNORMAL LOW (ref 8.4–10.5)
Creatinine, Ser: 1.65 mg/dL — ABNORMAL HIGH (ref 0.4–1.2)
GFR calc Af Amer: 43 mL/min — ABNORMAL LOW (ref >60)
GFR calc non Af Amer: 32 mL/min — ABNORMAL LOW (ref >60)
GFR calc non Af Amer: 36 mL/min — ABNORMAL LOW (ref >60)
Glucose, Bld: 194 mg/dL — ABNORMAL HIGH (ref 70–99)
Glucose, Bld: 577 mg/dL (ref 70–99)
Potassium: 3.5 mEq/L (ref 3.5–5.1)

## 2010-06-15 LAB — URINE MICROSCOPIC-ADD ON

## 2010-06-15 LAB — CK TOTAL AND CKMB (NOT AT ARMC)
Relative Index: INVALID (ref 0.0–2.5)
Total CK: 69 U/L (ref 7–177)

## 2010-06-15 LAB — CBC
HCT: 33.7 % — ABNORMAL LOW (ref 36.0–46.0)
HCT: 36.7 % (ref 36.0–46.0)
Hemoglobin: 11.2 g/dL — ABNORMAL LOW (ref 12.0–15.0)
MCHC: 33.2 g/dL (ref 30.0–36.0)
MCHC: 33.2 g/dL (ref 30.0–36.0)
MCHC: 33.5 g/dL (ref 30.0–36.0)
MCV: 76.1 fL — ABNORMAL LOW (ref 78.0–100.0)
MCV: 76.1 fL — ABNORMAL LOW (ref 78.0–100.0)
Platelets: 219 10*3/uL (ref 150–400)
RDW: 13 % (ref 11.5–15.5)
RDW: 13.1 % (ref 11.5–15.5)
WBC: 7.1 10*3/uL (ref 4.0–10.5)

## 2010-06-15 LAB — CULTURE, BLOOD (ROUTINE X 2)

## 2010-06-15 LAB — POCT CARDIAC MARKERS: Troponin i, poc: 0.15 ng/mL — ABNORMAL HIGH (ref 0.00–0.09)

## 2010-06-15 LAB — MAGNESIUM: Magnesium: 1.2 mg/dL — ABNORMAL LOW (ref 1.5–2.5)

## 2010-06-16 NOTE — Progress Notes (Signed)
Summary: Hospital F/U  Phone Note Other Incoming   Summary of Call: Received phone call from a Ms. Krieg at [facility] this morning re pt. -- is currently in [facility].  Had XFU appt. 07/08/10.  Dr. Ky Barban Mahi (sp?) who is currently treating pt. was concerned that pt. had appt. on 07/08/10 -- has elevated creatinine, hyperglycemia, ARF, requested earlier appt. for XFU.  Changed to 06/17/10.  ED records on your desk.   Initial call taken by: Sherian Maroon RN,  June 11, 2010 12:49 PM  Follow-up for Phone Call        reviewed notes will see pt XFU will need to review meds and recheck labs Follow-up by: Aurora Mask FNP,  June 11, 2010 1:17 PM

## 2010-06-17 ENCOUNTER — Encounter: Payer: Self-pay | Admitting: Nurse Practitioner

## 2010-06-17 ENCOUNTER — Encounter (INDEPENDENT_AMBULATORY_CARE_PROVIDER_SITE_OTHER): Payer: Self-pay | Admitting: Nurse Practitioner

## 2010-06-17 LAB — CONVERTED CEMR LAB
Cholesterol, target level: 200 mg/dL
HDL goal, serum: 40 mg/dL
LDL Goal: 100 mg/dL

## 2010-06-18 LAB — URINE MICROSCOPIC-ADD ON

## 2010-06-18 LAB — URINALYSIS, ROUTINE W REFLEX MICROSCOPIC
Glucose, UA: >1000 mg/dL — AB
Ketones, ur: 15 mg/dL — AB
Leukocytes, UA: NEGATIVE
pH: 6 (ref 5.0–8.0)

## 2010-06-18 LAB — POCT I-STAT, CHEM 8
BUN: 14 mg/dL (ref 6–23)
Calcium, Ion: 1.01 mmol/L — ABNORMAL LOW (ref 1.12–1.32)
Chloride: 107 meq/L (ref 96–112)
Creatinine, Ser: 1.6 mg/dL — ABNORMAL HIGH (ref 0.4–1.2)
Glucose, Bld: 248 mg/dL — ABNORMAL HIGH (ref 70–99)
HCT: 40 % (ref 36.0–46.0)
Hemoglobin: 13.6 g/dL (ref 12.0–15.0)
Potassium: 3.3 meq/L — ABNORMAL LOW (ref 3.5–5.1)
Sodium: 135 meq/L (ref 135–145)
TCO2: 19 mmol/L (ref 0–100)

## 2010-06-18 LAB — POCT PREGNANCY, URINE: Preg Test, Ur: NEGATIVE

## 2010-06-18 LAB — GLUCOSE, CAPILLARY: Glucose-Capillary: 233 mg/dL — ABNORMAL HIGH (ref 70–99)

## 2010-06-23 NOTE — Assessment & Plan Note (Signed)
Summary: [facility] F/u   Vital Signs:  Patient profile:   35 year old female Menstrual status:  regular LMP:     05/28/2010 Weight:      224.2 pounds BMI:     36.32 Temp:     98.2 degrees F oral Pulse rate:   85 / minute Pulse rhythm:   regular Resp:     20 per minute BP sitting:   130 / 87  (left arm) Cuff size:   large  Vitals Entered By: Isla Pence (June 17, 2010 4:19 PM)  Nutrition Counseling: Patient's BMI is greater than 25 and therefore counseled on weight management options. CC: hospital f/u [facility]...retaining fluid, Hypertension Management, Lipid Management Is Patient Diabetic? Yes Pain Assessment Patient in pain? no      CBG Result 141  Does patient need assistance? Functional Status Self care Ambulation Normal LMP (date): 05/28/2010     Enter LMP: 05/28/2010 Last PAP Result normal   CC:  hospital f/u [facility]...retaining fluid, Hypertension Management, and Lipid Management.  History of Present Illness:  Pt into the office to establish care. Previously going to [facility] and last visit 11/2009  S/p Hospitalization from 06/08/2010 to 06/12/2010 Pt presented to the ER with c/o lower abdominal pain, mostly in suprapubic area.  1.  Diabetes Mellitus - uncontrolled.  Hgba1c = 14.5. Pt has completed her supply of meds for the past month to include both her insulin and htn meds.  She was not in DKA.  Discharged on Novolog 70/30 - 45 units in the morning and 35 units in the pm  2.  HTN - BP elevated to 190/110. Pt was treated with IV labetalol.  Pt was switched to metoprolol and amlodipine and hydralazine were added.  Lisinopril was d/c due to increase in crt  3.  Chronic Kidney Disease - Crt 2.4 on admission.  suspect increase was due to htn and uncontrolled blood sugars.  Again lisinopril and hctz held at d/c until pt f/u with Dr. Justin Mend.  Pt has presented today with her medications as she has filled s/p hospital d/c  Hypertension History:      She  denies headache, chest pain, and palpitations.  She notes no problems with any antihypertensive medication side effects.        Positive major cardiovascular risk factors include diabetes, hyperlipidemia, and hypertension.  Negative major cardiovascular risk factors include female age less than 29 years old and non-tobacco-user status.        Positive history for target organ damage include renal insufficiency.  Further assessment for target organ damage reveals no history of ASHD, cardiac end-organ damage (CHF/LVH), or stroke/TIA.    Lipid Management History:      Positive NCEP/ATP III risk factors include diabetes and hypertension.  Negative NCEP/ATP III risk factors include female age less than 59 years old, non-tobacco-user status, no ASHD (atherosclerotic heart disease), and no prior stroke/TIA.      Habits & Providers  Alcohol-Tobacco-Diet     Alcohol drinks/day: 0     Tobacco Status: never  Exercise-Depression-Behavior     Drug Use: never  Allergies (verified): No Known Drug Allergies  Social History: Drug Use:  never  Review of Systems CV:  Complains of swelling of feet; denies fatigue. Resp:  Denies cough. GI:  Denies abdominal pain, nausea, and vomiting. Psych:  Denies depression. Endo:  Denies excessive urination.  Physical Exam  General:  alert.   Head:  normocephalic.   Lungs:  normal breath  sounds.   Heart:  normal rate and regular rhythm.   Abdomen:  normal bowel sounds.   Extremities:  1+ left pedal edema and 1+ right pedal edema.   Neurologic:  alert & oriented X3.   Skin:  color normal.   Psych:  Oriented X3.     Impression & Recommendations:  Problem # 1:  DIABETES MELLITUS, TYPE II (ICD-250.00) Hgba1c 14.5 during recent hospitalization advised pt to take insulin as ordered - she will be able to get meds from [facility] once she completes her eligibiity The following medications were removed from the medication list:     Lisinopril-hydrochlorothiazide 10-12.5 Mg Tabs (Lisinopril-hydrochlorothiazide) .Marland Kitchen... Take 1 two times a day    Levemir 100 Unit/ml Soln (Insulin detemir) .Marland Kitchen... 90 units each am Her updated medication list for this problem includes:    Novolin 70/30 70-30 % Susp (Insulin isophane & regular) .Marland KitchenMarland KitchenMarland KitchenMarland Kitchen 45 units in the morning and 35 units subcutaneously nightly    Aspirin 81 Mg Chew (Aspirin) ..... One tablet by mouth daily  Orders: Capillary Blood Glucose/CBG [id number])  Problem # 2:  HYPERTENSION (ICD-401.9) BP is stable continue current meds The following medications were removed from the medication list:    Clonidine Hcl 0.1 Mg Tabs (Clonidine hcl) .Marland Kitchen... Take 1 two times a day    Lisinopril-hydrochlorothiazide 10-12.5 Mg Tabs (Lisinopril-hydrochlorothiazide) .Marland Kitchen... Take 1 two times a day Her updated medication list for this problem includes:    Amlodipine Besylate 10 Mg Tabs (Amlodipine besylate) ..... One tablet by mouth daily for blood pressure    Hydralazine Hcl 25 Mg Tabs (Hydralazine hcl) ..... One tablet by mouth three times a day    Metoprolol Tartrate 100 Mg Tabs (Metoprolol tartrate) ..... One tablet by mouth two times a day  Problem # 3:  ASTHMA (ICD-493.90) stable for now The following medications were removed from the medication list:    Flovent Diskus Aepb (Fluticasone propionate (inhal) aepb) ..... Inhale 2 puffs every twelve hours as needed Her updated medication list for this problem includes:    Albuterol 90 Mcg/act Aers (Albuterol) ..... Inhale 1 puff every four hours as needed  Problem # 4:  CHRONIC KIDNEY DISEASE UNSPECIFIED (ICD-585.9) pt advised to make an appt with Dr. Justin Mend will hold diuretics until that appt  Complete Medication List: 1)  Albuterol 90 Mcg/act Aers (Albuterol) .... Inhale 1 puff every four hours as needed 2)  Rena-vite Tabs (B complex-c-folic acid) .... One tablet by mouth daily 3)  Calcium 600/vitamin D 600-400 Mg-unit Tabs (Calcium  carbonate-vitamin d) .... One tablet by mouth daily 4)  Novolin 70/30 70-30 % Susp (Insulin isophane & regular) .... 45 units in the morning and 35 units subcutaneously nightly 5)  Amlodipine Besylate 10 Mg Tabs (Amlodipine besylate) .... One tablet by mouth daily for blood pressure 6)  Hydralazine Hcl 25 Mg Tabs (Hydralazine hcl) .... One tablet by mouth three times a day 7)  Metoprolol Tartrate 100 Mg Tabs (Metoprolol tartrate) .... One tablet by mouth two times a day 8)  Aspirin 81 Mg Chew (Aspirin) .... One tablet by mouth daily  Hypertension Assessment/Plan:      The patient's hypertensive risk group is category C: Target organ damage and/or diabetes.  Her calculated 10 year risk of coronary heart disease is 2 %.  Today's blood pressure is 130/87.  Her blood pressure goal is < 130/80.  Lipid Assessment/Plan:      Based on NCEP/ATP III, the patient's risk factor category is "history of  diabetes".  The patient's lipid goals are as follows: Total cholesterol goal is 200; LDL cholesterol goal is 100; HDL cholesterol goal is 40; Triglyceride goal is 150.    Patient Instructions: 1)  Be sure you keep your eligiblity appointment to get an orange card. 2)  Call to get your appointment with the kidney doctor. 3)  You do have some swelling in your feet.  This may be due to being off your diuretic or it may be due to you restarting your insulin.  Will need to monitor. I do not want to restart your diuretic until you see the kidney doctor. 4)  You can take all your prescriptions to [facility] pharmacy once you get an orange card. 5)  Follow up in 6 weeks with n.martin for diabetes 6)  you will need cbg, u/a, foot check, peak flow, 02 sat Prescriptions: METOPROLOL TARTRATE 100 MG TABS (METOPROLOL TARTRATE) One tablet by mouth two times a day  #60 x 5   Entered and Authorized by:   Aurora Mask FNP   Signed by:   Aurora Mask FNP on 06/17/2010   Method used:   Print then Give to Patient    RxID:   [id number] NOVOLIN 70/30 70-30 % SUSP (INSULIN ISOPHANE & REGULAR) 45 units in the morning and 35 units subcutaneously nightly  #1 month qs x 5   Entered and Authorized by:   Aurora Mask FNP   Signed by:   Aurora Mask FNP on 06/17/2010   Method used:   Print then Give to Patient   RxID:   [id number] HYDRALAZINE HCL 25 MG TABS (HYDRALAZINE HCL) One tablet by mouth three times a day  #90 x 5   Entered and Authorized by:   Aurora Mask FNP   Signed by:   Aurora Mask FNP on 06/17/2010   Method used:   Print then Give to Patient   [id number] AMLODIPINE BESYLATE 10 MG TABS (AMLODIPINE BESYLATE) One tablet by mouth daily for blood pressure  #30 x 5   Entered and Authorized by:   Aurora Mask FNP   Signed by:   Aurora Mask FNP on 06/17/2010   Method used:   Print then Give to Patient   RxID:   [id number]    Orders Added: 1)  Capillary Blood Glucose/CBG [id number] 2)  Est. Patient Level IV [id number]     CT of Abdomen  Procedure date:  06/08/2010  Findings:      no acute findings in the abdomen and pelvis

## 2010-06-24 NOTE — Discharge Summary (Signed)
[name], [name]                ACCOUNT NO.:  [id number]  MEDICAL RECORD NO.:  [id number]           PATIENT TYPE:  I  LOCATION:  [id number]                         FACILITY:  [facility]  PHYSICIAN:  Verlee Monte, MD       DATE OF BIRTH:  01/12/1976  DATE OF ADMISSION:  06/08/2010 DATE OF DISCHARGE:  06/12/2010                              DISCHARGE SUMMARY   PRIMARY CARE PHYSICIAN:  [facility].  NEPHROLOGIST:  Sherril Croon, MD  REASON FOR ADMISSION:  Abdominal pain.  DISCHARGE DIAGNOSES: 1. Uncontrolled diabetes mellitus type 1. 2. Chronic kidney disease with acute kidney injury. 3. Accelerated hypertension. 4. Chronic anemia.  DISCHARGE MEDICATIONS: 1. Amlodipine 10 mg p.o. daily. 2. Hydralazine 25 mg p.o. t.i.d. 3. Metoprolol 100 mg p.o. b.i.d. 4. Novolin insulin 70/30, take 45 units with breakfast and 35 units     with supper. 5. Albuterol inhaler 2 puffs every 4 hours as needed for shortness of     breath. 6. Aspirin 81 mg p.o. daily. 7. Calcium carbonate 600 mg. 8. Vitamin D OTC daily. 9. Renal vitamin 1 tablet p.o. daily. 10.Vitamin D 50000 units 1 capsule monthly.  Stop taking the following medication, lisinopril/hydrochlorothiazide 10/12.5 mg.  BRIEF HISTORY AND EXAMINATION:  Ms. Linda Ellison is a 35 year old African American female with a known history of diabetes mellitus type 1 and hypertension.  The patient came into the emergency department, complaining about lower abdominal pain, mostly in suprapubic area.  The patient said the pain is radiating to the left flank and it has been going on for the past 3 days with difficulty in urinating.  In the ED, was found to have mild urinary retention with more than 300 mL retained urine in her bladder.  The patient placed in Foley.  The patient's pain improved since then.  Upon evaluation in the emergency department, also the patient was found to have blood pressure of 190/110 and blood sugar of 470.  The patient was not in  DKA or hyperglycemic hyperosmolar state. The patient was admitted for further evaluation.  BRIEF HOSPITAL STAY: 1. Diabetes mellitus, uncontrolled.  The patient's hemoglobin A1c is     14.5 which correlated to mean plasma glucose of 369.  The patient     admitted that for the past month, she ran out of her insulin as     well as her antihypertensive, and she has high blood sugar all the     time.  This has raised the question actual about the time of the     diabetes she has.  Usually, the type 1 diabetics are well presented     with DKA if they do not have any exogenous insulin administration.     The patient was not on DKA when she came in.  The patient's dose     was adjusted and she was discharged home on NovoLog 70/30 45 units     in the morning and 35 units in the p.m.  The patient to follow up     with [facility] for further adjustment of the dose according to  the hemoglobin A1c. 2. Accelerated hypertension.  Blood pressure was very high when the     patient admitted with a 190/110s.  The patient was treated with IV     labetalol.  That was switched to p.o. metoprolol as well as     amlodipine and hydralazine were added.  The patient was on     lisinopril that was discontinued because of the pump of the     creatinine.  Blood pressure when the patient left the hospital was     127/81. 3. Exacerbation of chronic kidney disease or acute on chronic renal     failure.  The patient admitted with creatinine of 2.4.  Her     baseline on March 12, 2010, was 1.6.  The increase in the     creatinine probably secondary to the accelerated hypertension and     uncontrolled blood sugars.  I spoke with Dr. Mercy Moore about the     patient's renal function and he recommended to hold lisinopril and     hydrochlorothiazide on discharge and let the patient follow up with     Dr. Justin Mend.  He recommended tighter control of the blood pressure as     well as the blood sugar. 4. Abdominal pain.   Abdominal pain was probably secondary to the urine     retention which resolved after the Foley.  The patient's Foley was     discontinued and the patient was urinating without difficulty.       There was no evidence of UTI or other abnormalities.  RADIOLOGY: 1. CT of abdomen and pelvis, no acute findings in the abdomen and     pelvis.  DISCHARGE INSTRUCTIONS: 1. Diet, heart-healthy carbohydrate-modified diet. 2. Activity as tolerated. 3. Disposition, home.     Verlee Monte, MD     ME/MEDQ  D:  06/12/2010  T:  06/13/2010  Job:  [id number]  cc:   Kara Dies, M.D.  Electronically Signed by Verlee Monte  on 06/24/2010 08:13:47 PM

## 2010-06-25 LAB — POCT I-STAT, CHEM 8
Calcium, Ion: 1.02 mmol/L — ABNORMAL LOW (ref 1.12–1.32)
Chloride: 105 mEq/L (ref 96–112)
HCT: 34 % — ABNORMAL LOW (ref 36.0–46.0)
Sodium: 135 mEq/L (ref 135–145)

## 2010-06-25 LAB — CBC
Hemoglobin: 11.5 g/dL — ABNORMAL LOW (ref 12.0–15.0)
MCHC: 33 g/dL (ref 30.0–36.0)
Platelets: 278 10*3/uL (ref 150–400)
RDW: 13.6 % (ref 11.5–15.5)

## 2010-06-25 LAB — DIFFERENTIAL
Basophils Absolute: 0 10*3/uL (ref 0.0–0.1)
Basophils Relative: 1 % (ref 0–1)
Monocytes Absolute: 0.5 10*3/uL (ref 0.1–1.0)
Neutro Abs: 2.6 10*3/uL (ref 1.7–7.7)
Neutrophils Relative %: 61 % (ref 43–77)

## 2010-07-06 NOTE — H&P (Signed)
NAMELIZBEHT, FRATTO                ACCOUNT NO.:  [id number]  MEDICAL RECORD NO.:  [id number]           PATIENT TYPE:  E  LOCATION:  [facility]                         FACILITY:  [facility]  PHYSICIAN:  Rise Patience, MDDATE OF BIRTH:  Mar 19, 1976  DATE OF ADMISSION:  06/08/2010 DATE OF DISCHARGE:                             HISTORY & PHYSICAL   The patient's primary care physician is Dr. Asa Lente, but presently the patient is unassigned.  CHIEF COMPLAINT:  Abdominal pain.  HISTORY OF PRESENT ILLNESS:  A 35 year old female with known history of diabetes mellitus type 1 who has not been on the insulin for the last 1 month due to financial issues and also has not been on antihypertensives.  The patient complained of low abdominal pain mostly in the suprapubic area which is radiating to her left flank and it has been ongoing for last 3 days with difficulty urinating.  In the ER, the patient was found to have urinary retention of more than 300 mL. The patient was placed on Foley after which the patient drained lot of fluids.  The patient's abdominal pain has improved but still persists in addition the patient was found to have increased blood pressure.  The patient has been admitted for further workup.  The patient states that the patient did have nausea and vomiting 3 times.  At this time she feels better after the Foley catheter was placed and denies any chest pain, shortness of breath.  Denies any palpitation, dizziness, focal deficit, headache, or visual symptoms.  As stated earlier the patient did not take a medication for almost a month because of financial issues.  PAST MEDICAL HISTORY:  Diabetes mellitus type 1, hypertension, chronic kidney disease, asthma.  PAST SURGICAL HISTORY:  Cesarean section, laparoscopic cholecystectomy.  MEDICATIONS:  Per admission, which the patient has not been taking almost for a month includes Novolin 70/30, 45 and 55 units twice daily, vitamin D  50,000 units monthly, renal vitamin, lisinopril hydrochlorothiazide 10/12.5 p.o. twice daily, calcium carbonate 600 mg over-the-counter, aspirin 81 mg p.o. daily, albuterol 2 puffs q.4 p.r.n. as needed for shortness of breath.  ALLERGIES:  No known drug allergies.  FAMILY HISTORY:  Significant for hypertension and diabetes.  SOCIAL HISTORY:  The patient denies smoking cigarette drinking alcohol of use of illegal drugs.  REVIEW OF SYSTEMS:  As per history of present illness nothing else significant.  PHYSICAL EXAMINATION:  GENERAL:  The patient examined bedside not in acute distress. VITAL SIGNS:  Blood pressure is 190/110, it has gone up to 194/119, pulse 97, temperature 97.9, respirations 18, O2 sat 100%. HEENT: Anicteric.  No pallor.  No discharge from ears, eyes, nose, or mouth. CHEST:  Bilateral air entry present.  No rhonchi.  No crepitation. HEART:  S1 and S2 heard. ABDOMEN:  Soft, mild tenderness in the suprapubic area.  No guarding. No rigidity.  Bowel sounds heard. CNS: Alert, awake, and oriented to time, place, and person.  Moves upper and lower extremities 5/5. EXTREMITIES:  Peripheral pulses present.  No edema.  LABORATORY DATA:  EKG shows normal sinus rhythm and heart rate is 86 beats  per minute, nonspecific ST-T changes.  QT interval is 400 milliseconds, QTc is 478 milliseconds, QRS is 88 milliseconds.  CBC WBC is 7.8, hemoglobin 11.3, hematocrit 34.5, platelets 299.  UA shows straw color, ketones negative, glucose 1000, nitrites negative, leukocyte esterase negative.  Basic metabolic panel sodium [id number], potassium 3.5, chloride 97, carbon dioxide 24, glucose 476, anion gap is 11, glucose 476, BUN 21, creatinine 2.4, calcium 8, CK-MB less than 1, troponin-I less than 0.05, myoglobin 151, wbc's 0, bacteria few.  ASSESSMENT: 1. Axillary hypertension. 2. Abdominal pain with urinary retention. 3. Acute renal failure on chronic kidney disease. 4. Uncontrolled diabetes  mellitus type 1, not diabetic ketoacidosis. 5. History of bronchial asthma. 6. Microcytic anemia.  PLAN: 1. At this time, we will admit the patient to telemetry. 2. For her uncontrolled diabetes, the patient was started on     Glucommander.  We will recheck BMET in another few hours along with     CBC and mag level.  Once her blood sugar gets more well controlled,     we will transition to long-acting insulin. 3. Abdominal pain with urinary retention.  At this time, the patient     is draining urine well with Foley catheter, we do not know the     exact cause for urine retention.  The patient still has some left     flank pain.  I am going to get a CT of the abdomen and pelvis     without contrast and also we will follow LFTs. 4. Axillary hypertension.  At this time, the patient is getting     labetalol p.r.n. which she will continue.  We will restart her     labetalol p.o.  at this time, restarting at a small dose fo 100 mg     p.o. b.i.d. which slowly can be increased to one p.o. b.i.d. as the     patient has acute renal failure and holding ACE inhibitor at this     time, once the creatinine improves we can restart ACE inhibitors. 5. Acute renal failure.  Gently hydrated the patient with fluids.     Recheck her BUN is another few hours, strict intake output, and     daily weight.  We also getting CT and pelvis for left flank pain.     Once the creatinine improves, we may restart her lisinopril.     Further recommendation as condition evolves.     Rise Patience, MD     ANK/MEDQ  D:  06/08/2010  T:  06/08/2010  Job:  [id number]  cc:   Mateo Flow A. Asa Lente, MD  Electronically Signed by Gean Birchwood MD on 07/06/2010 07:31:39 AM

## 2010-07-10 LAB — COMPREHENSIVE METABOLIC PANEL
ALT: 18 U/L (ref 0–35)
Albumin: 1.9 g/dL — ABNORMAL LOW (ref 3.5–5.2)
Albumin: 2.1 g/dL — ABNORMAL LOW (ref 3.5–5.2)
Alkaline Phosphatase: 84 U/L (ref 39–117)
BUN: 5 mg/dL — ABNORMAL LOW (ref 6–23)
Calcium: 7.6 mg/dL — ABNORMAL LOW (ref 8.4–10.5)
Creatinine, Ser: 1.12 mg/dL (ref 0.4–1.2)
Glucose, Bld: 312 mg/dL — ABNORMAL HIGH (ref 70–99)
Potassium: 3.6 mEq/L (ref 3.5–5.1)
Potassium: 3.9 mEq/L (ref 3.5–5.1)
Sodium: 136 mEq/L (ref 135–145)
Total Protein: 4.9 g/dL — ABNORMAL LOW (ref 6.0–8.3)
Total Protein: 5.5 g/dL — ABNORMAL LOW (ref 6.0–8.3)

## 2010-07-10 LAB — URINALYSIS, ROUTINE W REFLEX MICROSCOPIC
Ketones, ur: 15 mg/dL — AB
Leukocytes, UA: NEGATIVE
Nitrite: NEGATIVE
Protein, ur: >300 mg/dL — AB
pH: 6 (ref 5.0–8.0)

## 2010-07-10 LAB — BASIC METABOLIC PANEL
CO2: 25 mEq/L (ref 19–32)
Calcium: 7.6 mg/dL — ABNORMAL LOW (ref 8.4–10.5)
Chloride: 112 mEq/L (ref 96–112)
Creatinine, Ser: 1.06 mg/dL (ref 0.4–1.2)
GFR calc Af Amer: >60 mL/min (ref >60)
Glucose, Bld: 151 mg/dL — ABNORMAL HIGH (ref 70–99)

## 2010-07-10 LAB — GLUCOSE, CAPILLARY
Glucose-Capillary: 165 mg/dL — ABNORMAL HIGH (ref 70–99)
Glucose-Capillary: 229 mg/dL — ABNORMAL HIGH (ref 70–99)
Glucose-Capillary: 235 mg/dL — ABNORMAL HIGH (ref 70–99)
Glucose-Capillary: 246 mg/dL — ABNORMAL HIGH (ref 70–99)
Glucose-Capillary: 75 mg/dL (ref 70–99)
Glucose-Capillary: 89 mg/dL (ref 70–99)

## 2010-07-10 LAB — LIPID PANEL
HDL: 45 mg/dL (ref >39)
Total CHOL/HDL Ratio: 5.4 RATIO
VLDL: 29 mg/dL (ref 0–40)

## 2010-07-10 LAB — DIFFERENTIAL
Basophils Relative: 0 % (ref 0–1)
Eosinophils Absolute: 0 10*3/uL (ref 0.0–0.7)
Monocytes Absolute: 0.6 10*3/uL (ref 0.1–1.0)
Neutro Abs: 4.2 10*3/uL (ref 1.7–7.7)

## 2010-07-10 LAB — URINE CULTURE

## 2010-07-10 LAB — CBC
Hemoglobin: 11.8 g/dL — ABNORMAL LOW (ref 12.0–15.0)
MCHC: 32.9 g/dL (ref 30.0–36.0)
MCHC: 32.9 g/dL (ref 30.0–36.0)
MCV: 79.2 fL (ref 78.0–100.0)
MCV: 79.4 fL (ref 78.0–100.0)
Platelets: 250 10*3/uL (ref 150–400)
Platelets: 260 10*3/uL (ref 150–400)
RBC: 4.52 MIL/uL (ref 3.87–5.11)
RDW: 13.5 % (ref 11.5–15.5)
RDW: 13.6 % (ref 11.5–15.5)
RDW: 13.6 % (ref 11.5–15.5)
WBC: 5.1 10*3/uL (ref 4.0–10.5)

## 2010-07-10 LAB — APTT
aPTT: 23 seconds — ABNORMAL LOW (ref 24–37)
aPTT: 25 seconds (ref 24–37)

## 2010-07-10 LAB — PROTIME-INR
INR: 1 (ref 0.00–1.49)
Prothrombin Time: 12.8 seconds (ref 11.6–15.2)

## 2010-07-10 LAB — HEMOGLOBIN A1C
Hgb A1c MFr Bld: 10.9 % — ABNORMAL HIGH (ref 4.6–6.1)
Mean Plasma Glucose: 266 mg/dL

## 2010-07-10 LAB — URINE MICROSCOPIC-ADD ON

## 2010-07-10 LAB — POCT CARDIAC MARKERS: Troponin i, poc: <0.05 ng/mL (ref 0.00–0.09)

## 2010-07-10 LAB — CARDIAC PANEL(CRET KIN+CKTOT+MB+TROPI)
CK, MB: 0.7 ng/mL (ref 0.3–4.0)
Relative Index: INVALID (ref 0.0–2.5)
Total CK: 71 U/L (ref 7–177)
Troponin I: 0.01 ng/mL (ref 0.00–0.06)

## 2010-07-10 LAB — TSH: TSH: 3.894 u[IU]/mL (ref 0.350–4.500)

## 2010-07-10 LAB — D-DIMER, QUANTITATIVE: D-Dimer, Quant: 1.16 ug/mL-FEU — ABNORMAL HIGH (ref 0.00–0.48)

## 2010-08-04 HISTORY — PX: AV FISTULA PLACEMENT: SHX1204

## 2010-08-06 ENCOUNTER — Encounter (INDEPENDENT_AMBULATORY_CARE_PROVIDER_SITE_OTHER): Payer: Self-pay | Admitting: Vascular Surgery

## 2010-08-06 ENCOUNTER — Encounter (INDEPENDENT_AMBULATORY_CARE_PROVIDER_SITE_OTHER): Payer: Self-pay

## 2010-08-06 DIAGNOSIS — N186 End stage renal disease: Secondary | ICD-10-CM

## 2010-08-06 DIAGNOSIS — Z01810 Encounter for preprocedural cardiovascular examination: Secondary | ICD-10-CM

## 2010-08-06 DIAGNOSIS — N189 Chronic kidney disease, unspecified: Secondary | ICD-10-CM

## 2010-08-10 ENCOUNTER — Other Ambulatory Visit: Payer: Self-pay | Admitting: Vascular Surgery

## 2010-08-10 ENCOUNTER — Encounter (HOSPITAL_COMMUNITY)
Admission: RE | Admit: 2010-08-10 | Discharge: 2010-08-10 | Disposition: A | Discharge status: Home / Self Care | Payer: Medicaid Other | Source: Ambulatory Visit | Attending: Vascular Surgery | Admitting: Vascular Surgery

## 2010-08-10 ENCOUNTER — Ambulatory Visit (HOSPITAL_COMMUNITY)
Admission: RE | Admit: 2010-08-10 | Discharge: 2010-08-10 | Disposition: A | Discharge status: Home / Self Care | Payer: Medicaid Other | Source: Ambulatory Visit | Attending: Vascular Surgery | Admitting: Vascular Surgery

## 2010-08-10 DIAGNOSIS — N186 End stage renal disease: Secondary | ICD-10-CM

## 2010-08-10 DIAGNOSIS — Z01810 Encounter for preprocedural cardiovascular examination: Secondary | ICD-10-CM | POA: Insufficient documentation

## 2010-08-10 DIAGNOSIS — Z01818 Encounter for other preprocedural examination: Secondary | ICD-10-CM | POA: Insufficient documentation

## 2010-08-10 DIAGNOSIS — I10 Essential (primary) hypertension: Secondary | ICD-10-CM | POA: Insufficient documentation

## 2010-08-10 DIAGNOSIS — R0602 Shortness of breath: Secondary | ICD-10-CM | POA: Insufficient documentation

## 2010-08-10 DIAGNOSIS — Z01812 Encounter for preprocedural laboratory examination: Secondary | ICD-10-CM | POA: Insufficient documentation

## 2010-08-10 LAB — CBC
Platelets: 416 10*3/uL — ABNORMAL HIGH (ref 150–400)
RBC: 4.54 MIL/uL (ref 3.87–5.11)
RDW: 13.4 % (ref 11.5–15.5)
WBC: 7.7 10*3/uL (ref 4.0–10.5)

## 2010-08-10 LAB — BASIC METABOLIC PANEL
Calcium: 9 mg/dL (ref 8.4–10.5)
GFR calc Af Amer: 22 mL/min — ABNORMAL LOW (ref >60)
GFR calc non Af Amer: 18 mL/min — ABNORMAL LOW (ref >60)
Potassium: 4.5 mEq/L (ref 3.5–5.1)
Sodium: 137 mEq/L (ref 135–145)

## 2010-08-10 LAB — SURGICAL PCR SCREEN
MRSA, PCR: NEGATIVE
Staphylococcus aureus: NEGATIVE

## 2010-08-10 NOTE — Assessment & Plan Note (Signed)
OFFICE VISIT  SPRING, WALCZYK D DOB:  [birth year]                                       08/06/2010 [id number]  CHIEF COMPLAINT:  Needs hemodialysis access.  HISTORY OF PRESENT ILLNESS:  The patient is a 35 year old female referred by Dr. Justin Mend for placement of long-term hemodialysis access. She is right-handed.  She is currently not on dialysis.  Renal failure is thought to be secondary to diabetes and hypertension.  She does have some shortness of breath with exertion.  She also has some occasional skin itching.  It is difficult to determine whether or not this is fluid overload or uremia related.  She is noted to recently have a total protein urine of 844 on a recent exam.  Her protein to creatinine ratio was 7092.  Serum creatinine was 2.62 with a BUN of 24.  Other chronic medical problems include diabetes, hypertension, elevated cholesterol, congestive heart failure, and asthma.  These are chronic in nature and currently stable and followed by [facility] and Dr. Justin Mend. The patient has had diabetes since she was age 5.  PAST SURGICAL HISTORY:  Cholecystectomy and a C-section.  SOCIAL HISTORY:  She is single.  She has 2 children.  She is a nonsmoker, non consumer of alcohol.  FAMILY HISTORY:  Her mother had coronary disease at an early age and also was on hemodialysis.  She also had an uncle and an aunt on her maternal side that were on hemodialysis.  REVIEW OF SYSTEMS:  She has had some recent weight loss and loss of appetite.  She is 5 feet 6. PULMONARY:  She has occasional asthma but has not had a flareup recently. URINARY:  She has kidney dysfunction, as mentioned above.  All other systems are negative.  MEDICATIONS:  Lisinopril, hydrochlorothiazide, simvastatin, Levemir insulin, Renavite, aspirin, albuterol, Flovent, calcium supplement, clonidine, amlodipine, hydralazine, metoprolol, PhosLo.  She has no known drug  allergies.  PHYSICAL EXAMINATION:  Blood pressure 161/102 in the left arm, heart rate is 82 and regular, respirations 16.  HEENT is unremarkable.  Neck has 2+ carotid pulses without bruit.  Chest: Clear to auscultation. Cardiac:  Regular rate and rhythm without murmur.  Abdomen is obese, soft, nontender, nondistended.  No masses.  Extremities:  She has 2+ brachial and radial pulses bilaterally.  However, the artery feels like it may be small in caliber near the radial area.  Musculoskeletal exam otherwise shows no obvious major joint deformities.  No edema. Neurologic exam shows symmetric upper extremity and lower extremity motor strength which is 5/5.  Skin has no open ulcers or rashes.  She had a vein mapping ultrasound today which I reviewed and interpreted.  It shows the left cephalic vein is 4 mm at the level of the wrist and 3-5 mm in diameter in the antecubital and upper arm area. On placement of the tourniquet on the left arm, however, it is difficult to visualize the cephalic vein.  In the right upper extremity, there are similar findings with a reasonable sized cephalic basilic vein bilaterally.  ASSESSMENT:  The patient needs long-term hemodialysis access.  She has a vein that should be suitable for a left radiocephalic AV fistula. However, if the artery is quite small and does not seem to be a reasonable donor, we will consider a left brachiocephalic AV fistula at that time.  The risks, benefits,  possible complications and procedure details, including but not limited to bleeding, infection, non maturation of the fistula, and ischemic steal were all explained to the patient today.  She understands and agrees to proceed.  Her operation is scheduled for early next week.    Jessy Oto. Aniayah Alaniz, MD Electronically Signed  CEF/MEDQ  D:  08/10/2010  T:  08/10/2010  Job:  [id number]  cc:   [facility] Sherril Croon, M.D.

## 2010-08-11 ENCOUNTER — Ambulatory Visit (HOSPITAL_COMMUNITY)
Admission: RE | Admit: 2010-08-11 | Discharge: 2010-08-11 | Disposition: A | Discharge status: Home / Self Care | Payer: Medicaid Other | Source: Ambulatory Visit | Attending: Vascular Surgery | Admitting: Vascular Surgery

## 2010-08-11 DIAGNOSIS — I120 Hypertensive chronic kidney disease with stage 5 chronic kidney disease or end stage renal disease: Secondary | ICD-10-CM

## 2010-08-11 DIAGNOSIS — N186 End stage renal disease: Secondary | ICD-10-CM

## 2010-08-11 DIAGNOSIS — Z01812 Encounter for preprocedural laboratory examination: Secondary | ICD-10-CM | POA: Insufficient documentation

## 2010-08-11 DIAGNOSIS — Z01818 Encounter for other preprocedural examination: Secondary | ICD-10-CM | POA: Insufficient documentation

## 2010-08-11 DIAGNOSIS — E119 Type 2 diabetes mellitus without complications: Secondary | ICD-10-CM | POA: Insufficient documentation

## 2010-08-11 DIAGNOSIS — Z01810 Encounter for preprocedural cardiovascular examination: Secondary | ICD-10-CM | POA: Insufficient documentation

## 2010-08-11 LAB — GLUCOSE, CAPILLARY
Glucose-Capillary: 98 mg/dL (ref 70–99)
Glucose-Capillary: 91 mg/dL (ref 70–99)

## 2010-08-11 NOTE — Procedures (Unsigned)
CEPHALIC VEIN MAPPING  INDICATION:  Preoperative vein mapping for AV fistula placement.  HISTORY:  EXAM: The right cephalic vein is mostly compressible with diameter measurements ranging from 0.28 to 0.62 cm.  There is minimally occlusive fibrous material noted in the distal brachium level of the right cephalic vein which is consistent with a possible old thrombus.  The right basilic vein is compressible with diameter measurements ranging from 0.23 to 0.63 cm.  The left cephalic vein is compressible with diameter measurements ranging from 0.33 to 0.58 cm.  The left basilic vein is compressible with diameter measurements ranging from 0.34 to 0.73 cm.  See attached worksheet for all measurements.  IMPRESSION:  Patent bilateral cephalic and basilic veins with diameter measurements as described above.  Minimally occlusive fibrous band noted in the right cephalic vein, as described above.  ___________________________________________ Jessy Oto. Fields, MD  CH/MEDQ  D:  08/07/2010  T:  08/07/2010  Job:  [id number]

## 2010-08-13 LAB — POCT I-STAT 4, (NA,K, GLUC, HGB,HCT): Glucose, Bld: 87 mg/dL (ref 70–99)

## 2010-08-18 NOTE — H&P (Signed)
Linda Ellison, DALLEY                ACCOUNT NO.:  [id number]   MEDICAL RECORD NO.:  [id number]          PATIENT TYPE:  EMS   LOCATION:  [facility]                         FACILITY:  [facility]   PHYSICIAN:  [name], M.D.DATE OF BIRTH:  [birth year]   DATE OF ADMISSION:  04/12/2007  DATE OF DISCHARGE:                              HISTORY & PHYSICAL   PRIMARY CARE PHYSICIAN:  Dr. Barth Kirks Redmon at [facility] at  [facility].   CHIEF COMPLAINT:  Abdominal pain, nausea, vomiting.   HISTORY OF PRESENT ILLNESS:  The patient is a 35 year old African  American female with past medical history of diabetes mellitus secondary  chronic renal failure as well as hypertension who for the last three  days has had problems with nausea, vomiting and abdominal pain, unable  to keep anything down.  She describes her abdominal pain as generalized,  hurting all over, crampy, 8/10.  She last had a bowel movement about 24  hours before coming in, but she said it was not very much.  She has had  very little appetite.  When she came into the emergency room, she had  labs checked.  She was found to have normal white count with no shift.  Her blood sugars are around 251.  She was given 8 units of insulin.  She  was given morphine for her pain which did little to improve her  symptoms.  Currently, she looks in distress complaining of abdominal  pain all over and continues to feel nauseous.  She denies any chest pain  or shortness of breath.  She appears to be in such pain and writhing,  that she is not able to give me much more review of systems.   PAST MEDICAL HISTORY:  1. Chronic renal failure.  2. Hypertension.  3. Diabetes mellitus x14 years.  4. History of cholecystectomy.  5. History of C-section.   MEDICATIONS:  1. Insulin 70/30 30 units in the morning, 20 in the evening.  2. Metformin 1000 b.i.d.  3. Enalapril 20 b.i.d.  4. Albuterol p.r.n.  5. Cardia 240 daily.  6. HCTZ 25.  7. Lipitor  10.  8. Dialyvite 800 daily.  9. Lipitor 10.  10.Lovaza 2.  11.Calcium 500 b.i.d.  12.Aspirin 81.  13.Clarinex 5.  14.Ultram 50 p.r.n.  15.Flovent b.i.d.   ALLERGIES:  She has no known drug allergies.   SOCIAL HISTORY:  No tobacco, alcohol or drug use.   FAMILY HISTORY:  Noncontributory.   PHYSICAL EXAMINATION:  VITAL SIGNS:  Temperature 97.9, heart rate 114  now down to 110, blood pressure 150/118, now down to 134/88,  respirations 18, O2 saturation 100% on room air.  GENERAL:  Alert and oriented x3.  In some moderate distress secondary to  abdominal pain.  HEENT:  Normocephalic, atraumatic.  Mucous membranes dry.  No carotid  bruits.  HEART:  Regular rate and rhythm.  S1, S2.  LUNGS:  Clear to auscultation bilaterally.  ABDOMEN:  Soft, obese, some generalized nonspecific tenderness with  hypoactive bowel sounds.  EXTREMITIES:  No cyanosis, clubbing or edema.   LABORATORY DATA:  White count 6.9, H&H 14 and 43, MCV 78, platelets 347,  no shift.  UA has moderate hemoglobin, greater than 300 protein, 15  ketones.  Sodium 136, potassium 4, chloride 105, bicarbonate 23, BUN 15,  creatinine 0.9, glucose 351.  No x-rays have been ordered.   ASSESSMENT/PLAN:  1. Nausea, vomiting, abdominal pain.  I suspect this is diabetic      gastroparesis.  Will check and abdominal x-ray and then have treat      with IV Reglan plus PPI, nausea and pain medications.  2. Diabetes mellitus.  Sliding scale insulin.  Advance diet.  Check      hemoglobin A1C.  3. Chronic renal failure.  4. Hypertension.  Holding p.o. medications until she is better able to      take p.o.      [name], M.D.  Electronically Signed     SKK/MEDQ  D:  04/12/2007  T:  04/12/2007  Job:  [id number]   cc:   [name] [place] [facility]. Barth Kirks Redmon

## 2010-08-18 NOTE — Discharge Summary (Signed)
NAMESAILOR, KALLENBERGER                ACCOUNT NO.:  [id number]   MEDICAL RECORD NO.:  [id number]          PATIENT TYPE:  OBV   LOCATION:  5125                         FACILITY:  [facility]   PHYSICIAN:  Corinna L. Conley Canal, MDDATE OF BIRTH:  01-24-1976   DATE OF ADMISSION:  04/12/2007  DATE OF DISCHARGE:  04/13/2007                               DISCHARGE SUMMARY   DISCHARGE DIAGNOSES:  1. Vomiting, resolved.  2. Uncontrolled diabetes secondary to noncompliance.  3. Hypertension.  4. Chronic renal insufficiency.  5. Hyperlipidemia.   DISCHARGE MEDICATIONS:  1. Resume 70/30 insulin 55 units subcutaneous in the morning, 45 units      subcutaneously nightly.  2. Enalapril 20 mg a day.  3. Stop Hydrochlorothiazide.  4. Continue Cardizem CD 360 mg a day.  5. Lipitor 20 mg a day.  6. Multivitamin a day.  7. Calcium with vitamin D.  8. Metformin 1000 mg a day.  9. Protonix 40 mg a day.   FOLLOWUP:  Follow up with Ms. Barrie Folk in 2 weeks.   DISCHARGE INSTRUCTIONS:  1. Diet:  Diabetic, low-salt.  2. Activity:  Ad lib.   CONDITION:  Stable.   CONSULTATIONS:  None.   PROCEDURES:  None.   ACTIVITY:  Ad lib.   DISCHARGE MEDICATIONS:  Reglan 10 mg p.o. q.6 h p.r.n. nausea.   LABORATORY DATA:  CBC was unremarkable on admission.  Basic metabolic  panel on admission significant for a glucose of 351.  Liver function  tests significant for an albumin of 2.1, total protein 5.2, SGOT 74,  SGPT 62.  Amylase, lipase normal.  Hemoglobin A1c was 12.2.  Urinalysis  showed a specific gravity 1.035, greater than 1000 glucose, 15 ketones,  moderate blood, greater than 300 protein, negative nitrite, negative  leukocyte esterase, 3-6 red cells, rare bacteria.   SPECIAL STUDIES/RADIOLOGY:  Acute abdominal series showed right basilar  atelectasis with nonspecific bowel gas pattern.   HISTORY AND HOSPITAL COURSE:  Linda Ellison is a pleasant 35 year old black  female with multiple medical problems who ran  out of her medications  including her insulin a month and half ago.  She presented with vomiting  and abdominal pain.  Please see H&P for admission details.  Her heart  rate was 114, blood pressure 150/118, otherwise normal vital signs.  She  appeared to be in some distress due to pain.  She had generalized  nonspecific tenderness, hypoactive bowel sounds.  She was admitted for  workup and treatment, started on IV fluids, pain medications,  antiemetics.  She was started on sliding scale insulin.  I suspect she  had either viral gastroenteritis or gastroparesis.  She had been on  Reglan during the hospitalization.  At the time of discharge, she is  feeling much better, glucose is under better control.  She is tolerating  a solid diet.  I encouraged her to follow up with her primary care  office.  Apparently, there is some issue with money owed, and that is  why she ran out of her medication.      Corinna L. Conley Canal, MD  Electronically  Signed     CLS/MEDQ  D:  04/13/2007  T:  04/13/2007  Job:  [id number]   cc:   [facility] [place], NP

## 2010-08-21 NOTE — H&P (Signed)
[facility]  Patient:    Linda Ellison, Linda Ellison                       MRN: [id number] Adm. Date:  [id number] Attending:  Margreta Journey                         History and Physical  HISTORY:                      The patient is a 35 year old female gravida 2, para 1, who is approximately 12-[redacted] weeks gestation.  She came to triage because of vaginal bleeding and pelvic pain.  After the evaluation, including that of an ultrasound, she was diagnosed as having intrauterine fetal demise, and was scheduled for a dilatation and evacuation.  The patient had had no other prenatal problems.  PAST MEDICAL HISTORY:         Significant as well as her review of systems in that she has juvenile diabetes, and no other known problems.  She has also had a gallbladder surgery in the past.  PHYSICAL EXAMINATION:  GENERAL:                      A well-developed, well-nourished female, in abdominal distress.  HEENT:                        Within normal limits.  NECK:                         Supple.  BREASTS:                      Without masses, tenderness, or discharge.  LUNGS:                        Clear to percussion and auscultation.  HEART:                        Normal sinus rhythm without murmur, rub, or gallop.  ABDOMEN:                      Benign.  EXTREMITIES:                  Within normal limits.  NEUROLOGIC:                   Within normal limits.  PELVIC:                       External genitalia, BUS within normal limits. The vagina had a moderate amount of blood in the vault.  The cervix is dilated.  Products of conception protruded into the cervical os.  The uterus was approximately 12 weeks in size.  The adnexa are benign.  ADMISSION DIAGNOSES:          Intrauterine fetal demise.  PLAN:                         A dilatation and evacuation and curettage. DD:  05/12/00 TD:  05/12/00 Job: [id number]

## 2010-08-21 NOTE — Op Note (Signed)
[facility]  Patient:    Linda Ellison, Linda Ellison Visit Number: [id number] MRN: [id number]          Service Type: OBS Location: 910A [id number] [id number] Attending Physician:  Edmonia Caprio Dictated by:   Cranston Neighbor, M.D. Proc. Date: 03/22/01 Admit Date:  01/31/2001 Discharge Date: 03/25/2001                             Operative Report  PREOPERATIVE DIAGNOSIS:       Intrauterine pregnancy with premature prolonged                               rupture of membranes, preterm labor, breech                               presentation at 34-5/[redacted] weeks gestation, desire                               for surgical sterilization.  POSTOPERATIVE DIAGNOSIS:      Intrauterine pregnancy with premature prolonged                               rupture of membranes, preterm labor, breech                               presentation at 34-5/[redacted] weeks gestation, desire                               for surgical sterilization.  OPERATION:                    Low transverse cesarean and modified bilateral                               Pomeroy tubal ligation.  SURGEON:                      Cranston Neighbor, M.D.  ASSISTANT:                    Ardine Eng, M.D.  ANESTHESIA:                   Spinal.  DESCRIPTION OF PROCEDURE:     After placing the patient under a spinal anesthetic, the patient was prepped and draped in a sterile fashion.  A low transverse Pfannenstiel incision was made.  An incision was in the ______ fascia.  The peritoneal cavity was entered.  Bladder flap created and a lower transverse uterine incision was made.  The baby delivered breech presentation without difficulty.  There was no entrapment of fetal head.  The baby handed to neonatologist in attendance.  The placenta was delivered spontaneously. The uterus and bladder flap were closed in a routine fashion.  The right fallopian was identified and grasped with a Babcock and followed to the fimbriated  end.  A segment of tube was brought into the operative field, double suture ligated.  An approximately 1.5 cm to 2 cm segment was  excised and used as an anchor.  When then proceeded on the opposite side and hemostasis again adequate.  The anterior peritoneal fascia, subcutaneous tissue, and skin closed in the routine fashion.  Estimated blood loss less than 800 cc.  Needle, instrument, and sponge count correct. Dictated by:   Cranston Neighbor, M.D. Attending Physician:  Edmonia Caprio DD:  04/13/01 TD:  04/13/01 Job: [id number]

## 2010-08-21 NOTE — Discharge Summary (Signed)
[place]  Patient:    Linda Ellison, Linda Ellison Visit Number: [id number] MRN: [id number]          Service Type: OBS Location: [place] 01 Attending Physician:  Lahoma Crocker Dictated by:   Abran Duke. Smith Admit Date:  11/03/2000 Discharge Date: 11/04/2000   CC:         [facility] at [facility]   Discharge Summary  DATE OF BIRTH:                07-Oct-1975.  ADMITTING DIAGNOSIS:          Bleeding in pregnancy.  DISCHARGE DIAGNOSIS:          Bleeding in pregnancy.  HISTORY OF PRESENT ILLNESS:   This is a 35 year old African-American female, gravida 3, para 1-0-1-1, at 16-2/[redacted] weeks gestation presenting to [facility] secondary to bleeding during pregnancy. The patient reports that she was helping lift her mother after her mother had fallen and the patient developed vaginal bleeding. The patient noticed large amounts of blood in the toilet at that time.  HOSPITAL COURSE:              The patient was admitted for observation at that time. The patients blood type was O positive, hemoglobin of 11.5, hematocrit 34.5, platelets 257,000. Kleihauer-Betke was negative for fetal cells. On August 2, the patient was denying any further vaginal bleeding, denied any abdominal pain, and was requesting discharge. Also of note, this patient is an insulin-dependent diabetic. Following ______ blood sugars were running in the 70s, fasting. An obstetric ultrasound was obtained also during the admission that revealed a single fetus with an anterior placement of the placement, grade 0. Amniotic fluid was within normal limits for 16 weeks. There was a 2.5 cm fluid pocket present. Cervical length was 5.0 cm. The patient was discharged to follow up next week at the [facility].  LABORATORY DATA:              Urinalysis revealed straw-colored urine that was clear, specific gravity 1.002, pH 7.5, negative for all ______.  Blood type O positive. Wet prep  reviewed: few clue cells and rare WBCs. GC probe was negative and CT probe was negative.  DISCHARGE FOLLOWUP:           Followup is as noted above. The patient already has a scheduled appointment for Wednesday, August 7 at the [facility] at [facility].  DISCHARGE CONDITION:          Stable.  DISPOSITION:                  The patient was discharged to home on bed rest.  DISCHARGE MEDICATIONS:        The patient was to continue her prenatal vitamins.  DISCHARGE INSTRUCTIONS:       Activity: The patient is to have restricted activity at home, modified bed rest and pelvic rest. Diet: Regular. Special instructions: Recommended patient to contact [facility] with recurrence of bleeding or abdominal pain. Dictated by:   Abran Duke. Tamala Julian Attending Physician:  Lahoma Crocker DD:  11/04/00 TD:  11/06/00 Job: [id number]

## 2010-08-21 NOTE — Discharge Summary (Signed)
[facility]  Patient:    Linda Ellison, Linda Ellison Visit Number: [id number] MRN: [id number]          Service Type: OBS Location: 910A 9133 01 Attending Physician:  Edmonia Caprio Dictated by:   Bonna Gains, C.N.M. Admit Date:  01/31/2001 Discharge Date: 03/25/2001                             Discharge Summary   ADMITTING DIAGNOSES:         1. Insulin-dependent diabetes with history                                  of diabetic ketoacidosis.                               2. Premature rupture of membranes at 28-4/7                                  weeks.                               3. History of large for gestational age and                                  shoulder dystocia.                               4. Advanced maternal age.                               5. Positive group B strep carriage.                               6. Chronic borderline hypertension.                               7. Elevated maternal serum alpha-fetoprotein.                               8. Dental caries.                               9. Breech presentation.                              75. Desire sterilization.  DISCHARGE DIAGNOSES:          1. Primary low transverse cesarean section at                                  approximately 36 weeks.  2. Bilateral tubal ligation.                               3. Insulin-dependent diabetes, fair control.  HISTORY:                      This is a 35 year old G3, P1-0-1-1, who presented at 28-4/7 weeks, very well dated by LMP consistent with 16-week scan, and consistent with a nine-week scan also.  She came in on the day of experiencing spontaneous rupture of membranes, large amount of clear fluid. She was also experiencing some mild irregular lower abdominal contractions. She had no vaginal bleeding.  Fetal activity was good.  She stated her fasting blood sugar had been 83 on the day of admission.   Source of care:  [facility] with transfer to the high-risk clinic, onset of care at 12 weeks. Pregnancy complications:  She had a UTI in the first trimester which was treated.  She did have some abnormal second trimester bleeding which was thought to be a chronic marginal separation, and of note, had an borderline elevated MSAFP.  She had chronic headaches to the extent that she needed to see a neurologist, who found her to have a mild visual field restriction.  She was noted to have positive GBS carriage at 20 weeks and received antibiotics. She had cervicitis one week prior to admission and was treated with Zithromax 1 g.  Of note, she also was thought to have chronic hypertension and mild asthma.  MEDICATIONS:                  1. Prenatal vitamin.                               2. Albuterol.  ALLERGIES:                    None.  Of note, she had a fetal echo that was normal during her pregnancy.  OB HISTORY:                   In 1993, SVD 40 weeks, 9 pounds 11 ounces with pregnancy-induced hypertension.  In 2002, she had 14-week SAB with a D&C.  GYN HISTORY:                  Negative for STDs.  MEDICAL HISTORY:              Significant for IDDM diagnosed in 1994 with history of DKA.  She had frequent UTIs, asthma, and migraine headaches.  SURGICAL HISTORY:             D&C as above.  Cholecystectomy in 1994.  FAMILY/SOCIAL HISTORY:        Mother CVD, hypertension, renal disease.  The patient had negative tobacco, alcohol, or drug use.  PRENATAL LABORATORIES:        Significant for positive GBS at 20 weeks. Glycohemoglobin in first trimester was 9.9.  She had an antiphospholipid workup which was negative.  Fetal echo was negative.  One month prior to admission, she was noted to have borderline _____ with an AGA baby, 22.6 was her AFI.  ADMISSION PHYSICAL EXAMINATION:  VITAL SIGNS:                  Temperature 98.2, pulse 100, BP 136/77 and 131/88.  GENERAL:                       NAD.  HEART:                        RR.  LUNGS:                        CTA.  ABDOMEN:                      Soft, NT.  EXTREMITIES:                  Without edema.  NEUROLOGIC:                   1+ DTRs.  PELVIC:                       Speculum exam revealed thick white discharge mixed with small amount thin gray fluid.  Digital exam deferred.  Baseline fetal heart rate 150-155, average variability, mild sharp decelerations, 10 beat per minute accelerations.  Tocolysis revealed uterine irritability.  Nitrazine and fern was negative.  However, her ultrasound revealed an AFI of 1.4 consistent with rupture of membranes, baby in breech positioning.  Growth percentile was 62nd to 79th.  Her cervix looked closed per ultrasound _____, but was not measured.  The patient was felt to be contracting during the exam.  ASSESSMENT:                   The patient was at 35-4/7 weeks well dated with preterm premature rupture of membranes.  Her insulin-dependent diabetes had shown good glycemic control recently.  She was admitted to be given betamethasone, started on Unasyn, and observed.  CONSULTANT:                   Dr. Hoy Morn.  HOSPITAL COURSE:              As far as her insulin-dependent diabetes, her CBGs were fair to very good control on insulin.  Her PPROM was stable for many weeks.  She received Unasyn IV and after about 10-14 days, this was changed to gentamicin and clindamycin due to the patient experiencing some crampiness. She did receive betamethasone.  Her antibiotics were continued IV throughout the hospitalization.  She had leakage daily of very small amounts of clear fluid.  Of note, she had an Amniostat done on March 21, 2001, which was sent to [facility] and was negative on the vaginal pull.  On March 22, 2002, she began experiencing severe lower abdominal pain and contractions.  On exam, she was felt to be 2-3, 100%, and ultrasound revealed the baby to  still be in a breech presentation.  The decision was made by  Dr. Hoy Morn to proceed with primary low transverse cesarean section for that reason.  She had no intraoperative complications.  She did have her bilateral P[place]oy tubal ligation done.  The babys Apgars were 8 at one minute, 9 at one minute.  Weight was 5 pounds, 13 ounces, and the baby was admitted to the NICU.  Cord pH was 7.35.  Postoperatively, the patient did well and on postoperative day #3, she was discharged home.  However, her glycemic control was uncertain, as sugars had not been checked on a regular basis.  Her two-hour post breakfast sugar was normal, so  she was therefore sent home not on insulin.  However, she was to keep track of her blood sugars and call internal medicine clinic for her primary medical doctor to do her postpartum followup as well as go to [facility] at six weeks for a routine postpartum followup.  Her staples were removed on day #3 and the incision was healing well.  She was tolerating a regular diet.  She had flatus.  DISCHARGE MEDICATIONS:        1. Ibuprofen 600 mg 1 p.o. q.6h.                               2. Ferrous sulfate 1 p.o. q.d.                               3. She was given a prescription for Novolin                                  which she was to have begun using if her                                  sugars were elevated when she called in                                  to the nutrition management center prior                                  to her visit with the internal medicine                                  clinic.  DIET:                         She was sent home on an ADA diet, 2200 calories and she did receive her [place] immunization on March 25, 2001. Dictated by:   Bonna Gains, C.N.M. Attending Physician:  Edmonia Caprio DD:  05/15/01 TD:  05/16/01 Job: [id number]

## 2010-08-21 NOTE — Op Note (Signed)
[facility]  Patient:    Linda Ellison, Linda Ellison                       MRN: [id number] Proc. Date: 05/12/00 Adm. Date:  [id number] Attending:  Margreta Journey                           Operative Report  PREOPERATIVE DIAGNOSIS:       Intrauterine fetal demise at approximately 88 to                               20 weeks.  OPERATION:                    Dilatation and evacuation and curettage.  POSTOPERATIVE DIAGNOSIS:      1. Intrauterine fetal demise at approximately                                  12 to 64 weeks.                               2. Pending pathology.  SURGEON:                      Margreta Journey, M.D.  ANESTHESIA:                   MAC.  ESTIMATED BLOOD LOSS:         200 cc.  DISPOSITION:                  The patient tolerated the procedure well and returned to the recovery room is satisfactory condition.  DESCRIPTION OF PROCEDURE:     The patient was taken to the operating room, prepped and draped in the usual fashion for dilatation and evacuation. The speculum was placed in the vagina following which the anterior lip of the cervix was then grasped with the [facility] tenaculum. A paracervical block utilizing 2% Xylocaine solution was instituted following which the cervical os was dilated to a size 18 Pratt dilator. With a size 12 suction curet, evacuation of the endometrial cavity was then carried out without any problems. After this was felt to clean, the sharp curet was utilized and further cleansed the uterine cavity. After this was done, the procedure was then terminated. The patient tolerated the procedure well and returned to the recovery room in satisfactory condition. The products of conception were labeled and sent to pathology.  The patient was told of the possible complications and care for this type of surgery. She was told to return to my office in four weeks for followup evaluation or to call me prior to that time should  any problems arise. DD:  05/12/00 TD:  05/12/00 Job: [id number]

## 2010-08-21 NOTE — Letter (Signed)
Aug 09, 2006    Edrick Oh, MD  [address]  [place], [place]   RE:  DILA, HOPWOOD  MRN:  [id number]  /  DOB:  [birth year]   Dear Hassell Done,   It was my pleasure to see Linda Ellison as an outpatient at the [facility]  Cardiology Office on Aug 09, 2006.   As you know she is a very nice 35 year old woman who presented today  with a chief complaint of chest pain.   Linda Ellison describes a 2 to 3 month history of intermittent substernal  chest pain. The pain is variable and at times feels like a dull ache  and at other times feels like a sharp pain. It is not consistently  related to exertion although at times it does occur with exertion. Often  times her pain occurs at rest. She exercises regularly at the gym on an  elliptical machine and the treadmill and sometimes has pain with these  activities but at other times is asymptomatic. She denies any exertional  dyspnea, orthopnea, or PND. She had an evaluation for syncope which just  occurred yesterday. She was seen in the emergency department. She  describes an episode at work when she felt lightheaded and then does not  remember the remainder of the episode but recalls waking up in the  emergency room. She was told that she was confused when she first awoke.  Today she feels back to her normal state of health. She has not had  prior syncope. She also had a recently evaluation for chest pain in the  emergency room on April 27th.   PAST MEDICAL HISTORY:  Pertinent for the following;  1. Essential hypertension. She is on multiple medications for marked      hypertension.  2. Type 2 diabetes with diabetic nephropathy. She was diagnosed with      diabetes approximately 12 years ago. She is treated with insulin      and metformin.  3. Proteinuria.  4. Dyslipidemia.  5. Cholecystectomy in 1995.  6. C-Section in 2002.   CURRENT MEDICATIONS:  Include;  1. Novolin 70/30 units daily.  2. Metformin 1000 mg twice daily.  3.  Cartia XT 360 mg daily.  4. Hydrochlorothiazide 25 mg daily.  5. Enalapril 20 mg twice daily.  6. Lipitor 10 mg daily.  7. Prenatal vitamin with iron daily.  8. Lovaza daily.  9. Calcium plus D 500 mg twice daily.  10.Aspirin 81 mg daily.  11.Clarinex 5 mg daily.  12.Flovent 2 puffs twice daily.   ALLERGIES:  No known drug allergies.   SOCIAL HISTORY:  The patient is single. She has 2 children. She grew up  in [place] but has lived in [place] for over 10 years. She  works for [employer] in [employer]. She does not smoke  cigarettes, drink alcohol, or use drugs. She exercises regularly at the  gym.   FAMILY HISTORY:  Pertinent information from the family history is that  her mother died at age 40 of a myocardial infarction. Her father is  alive at age 26 and has hypertension. She has a brother who is age 92  and he has hypertension and asthma.   REVIEW OF SYSTEMS:  Pertinent positives included seasonal allergies,  asthma, gastroesophageal reflux, leg swelling, headaches, dizziness, and  calf pain.   PHYSICAL EXAMINATION:  The patient is alert and oriented. She is in no  acute distress. Weight is 231 pounds, blood pressure  in the right arm  was 120/90, blood pressure in the left arm was 122/100, heart rate was  102, respiratory rate 16.  HEENT: Normal.  NECK: Normal carotid upstrokes without bruits. Jugular venous pressure  is normal. No thyromegaly or thyroid nodules.  LUNGS: Clear to auscultation bilaterally.  HEART: The apex is discrete and nondisplaced. The heart is regular rate  and rhythm without murmurs or gallops.  ABDOMEN: Soft, obese, nontender. No organomegaly. No abdominal bruits.  EXTREMITIES: No clubbing or cyanosis. There is trace bilateral pretibial  edema. Peripheral pulses are 2 + and equal throughout.  SKIN: Warm and dry without rash.  LYMPHATICS: There is adenopathy.  NEUROLOGIC: Cranial nerves II-XII are intact. Strength is 5/5  and equal  in the arms and legs.   The EKG shows sinus tachycardia with a ventricular rate of 102.  Otherwise within normal limits.   Labs reviewed from May 5th shows a hemoglobin of 11.0, hematocrit of 34,  creatinine of 1.15, glucose of 153, BUN of 19, potassium of 3.8.   Chest x-ray shows normal heart size and mediastinal contours to be  within normal limits. Clear lung fields without effusions. There is no  acute disease.   ASSESSMENT:  Linda Ellison is a 35 year old woman presenting with chest pain  that has both typical and atypical features. Her risk of underlying  coronary artery disease is low based on her young age. However, she  really does have multiple risk factors with over 10 years of diabetes,  longstanding hypertension, and a family history of premature coronary  artery disease. With this background I think it is prudent to pursue a  stress test. We have scheduled her for an exercise Myoview study. In the  setting of her syncopal event yesterday, I have also arranged for her to  have an echocardiogram to rule out structural heart disease. I do not  detect any abnormalities on her physical exam but would like to do an  echo to be certain. I think it is unlikely that she has a cardiac  etiology to her syncopal spell and in a young person the mostly likely  diagnosis would be vasovagal or neurodepressor syncope. Her 12-lead ECG  is normal, and if she proves to have a structurally normal heart, then  an arrhythmic etiology to her syncope would be unlikely as well.   Hassell Done, thanks again for allowing me to see Linda Ellison. I will be in  touch after her echo and stress test are available. Please feel free to  call at anytime with questions regarding her care.    Sincerely,      Juanda Bond. Burt Knack, MD    MDC/MedQ  DD: 08/09/2006  DT: 08/09/2006  Job #: [id number]   CC:    N. Redmon

## 2010-08-25 NOTE — Op Note (Signed)
  Linda Ellison, Linda Ellison                ACCOUNT NO.:  [id number]  MEDICAL RECORD NO.:  [id number]           PATIENT TYPE:  O  LOCATION:  [facility]                         FACILITY:  [facility]  PHYSICIAN:  Jessy Oto. Diora Bellizzi, MD  DATE OF BIRTH:  1975-06-28  DATE OF PROCEDURE:  08/11/2010 DATE OF DISCHARGE:  08/11/2010                              OPERATIVE REPORT   PROCEDURE:  Left radiocephalic AV fistula.  PREOPERATIVE DIAGNOSIS:  End-stage renal disease.  POSTOPERATIVE DIAGNOSIS:  End-stage renal disease.  ANESTHESIA:  General.  ASSISTANT:  Leta Baptist, PA-C.  OPERATIVE DETAILS:  After obtaining informed consent, the patient was taken to the operating room.  The patient was placed in supine position on the operating table.  After induction of general anesthesia, the patient's entire left upper extremity was prepped and draped in usual sterile fashion.  Next, a longitudinal incision was made midway between the cephalic vein and radial artery in the left wrist.  Incision was carried down through the subcutaneous tissue.  The cephalic vein was dissected free circumferentially.  Small side branches were ligated and divided between silk ties.  The vein was approximately 3.5 Mm in diameter.  Next, radial artery was dissected free in the medial portion incision.  The radial artery was fairly small, although there was some spasm within it.  This was approximately 1.5 mm in diameter.  The artery was dissected free circumferentially and small side branches were ligated and divided between silk ties.  The patient was then given 5000 units of intravenous heparin.  The distal cephalic vein was ligated with a 2-0 silk tie and the vein transected gently and irrigated with heparinized saline and swung over the level of the artery.  The vein was marked for orientation.  Small bulldog clamps were used to control the artery proximally and distally.  A longitudinal opening was made in the radial artery  and vein was sewn end of vein to side of artery using running 7-0 Prolene suture.  Just prior to completion of anastomosis, it was fore bled, back bled, and thoroughly flushed.  Anastomosis was secured.  Clamps were released.  There was palpable thrill in the fistula immediately.  Hemostasis was obtained.  Subcutaneous tissues were reapproximated using running 3-0 Vicryl suture.  Skin was closed with 4-0 Vicryl subcuticular stitch.  The patient tolerated the procedure well and there were no complications.  Sponge and needle counts were correct at the end of the case.  The patient was taken to recovery room in stable condition.     Jessy Oto. Niomi Valent, MD    CEF/MEDQ  D:  08/11/2010  T:  08/12/2010  Job:  [id number]  Electronically Signed by Ruta Hinds MD on 08/25/2010 08:24:35 AM

## 2010-08-27 ENCOUNTER — Ambulatory Visit (INDEPENDENT_AMBULATORY_CARE_PROVIDER_SITE_OTHER): Payer: Medicaid Other | Admitting: Vascular Surgery

## 2010-08-27 DIAGNOSIS — N186 End stage renal disease: Secondary | ICD-10-CM

## 2010-08-28 NOTE — Assessment & Plan Note (Signed)
OFFICE VISIT  ARETINA, FINGERHUT DOB:  [birth year]                                       08/27/2010 [id number]  The patient returns for followup today.  She had a left radiocephalic AV fistula placed on 08/11/2010.  She is currently not on hemodialysis.  PHYSICAL EXAM:  Today blood pressure is 194/130 in the right arm, heart rate 97 and regular, respirations 18.  Left radiocephalic fistula has an easily palpable thrill and audible bruit, seems to be maturing at this point although the vein is not very prominent in the forearm yet.  ASSESSMENT:  Healed left radiocephalic arteriovenous fistula.  Will need to continue to follow for further development.  She will follow up in August of 2012 with a duplex ultrasound to assess maturity of her fistula.    Jessy Oto. Fields, MD Electronically Signed  CEF/MEDQ  D:  08/27/2010  T:  08/28/2010  Job:  [id number]  cc:   Sherril Croon, M.D.

## 2010-09-03 ENCOUNTER — Ambulatory Visit (INDEPENDENT_AMBULATORY_CARE_PROVIDER_SITE_OTHER): Payer: Medicaid Other | Admitting: Vascular Surgery

## 2010-09-03 DIAGNOSIS — N186 End stage renal disease: Secondary | ICD-10-CM

## 2010-09-04 NOTE — Assessment & Plan Note (Signed)
OFFICE VISIT  Linda Ellison, Linda Ellison DOB:  1976/03/18                                       09/03/2010 [id number]  The patient returns for followup today.  She recently had a left radiocephalic AV fistula placed on May 8.  There is easily palpable thrill and audible bruit in the left radiocephalic AV fistula.  She has a 2 cm superficial opening in the incision proximally.  This is very superficial with good granulation tissue and I believe should heal within the next few weeks.  I have told her to stop exercising the fistula until this is completely healed.  I will see her back in 3 weeks' time.  She will return sooner if she has any further deterioration of the wound.  I do not believe it requires antibiotics at this time.    Jessy Oto. Fields, MD Electronically Signed  CEF/MEDQ  D:  09/03/2010  T:  09/04/2010  Job:  [id number]  cc:   Sherril Croon, M.D.

## 2010-09-24 ENCOUNTER — Ambulatory Visit (INDEPENDENT_AMBULATORY_CARE_PROVIDER_SITE_OTHER): Payer: Medicaid Other | Admitting: Vascular Surgery

## 2010-09-24 DIAGNOSIS — N186 End stage renal disease: Secondary | ICD-10-CM

## 2010-09-25 NOTE — Assessment & Plan Note (Signed)
OFFICE VISIT  Linda Ellison, Linda Ellison DOB:  12/09/1975                                       09/24/2010 [id number]  The patient returns for followup today.  She had a left radiocephalic AV fistula placed on 08/11/2010.  She returns today for further followup. She states that she has some intermittent numbness and tingling in her left hand but this is not really bothersome to her.  She also has persistent numbness in the dorsal aspect of her left thumb.  She is currently not on hemodialysis.  She had followup with her renal doctor yesterday and has followup scheduled again in the near future.  PHYSICAL EXAM:  Today blood pressure is 167/139 in the right arm, heart rate 99 and regular, respirations 24.  Left wrist incision is well- healed.  She has some decreased sensation on the dorsal aspect of her left thumb.  Otherwise, motor and sensory are intact in the left hand. There is an easily palpable thrill in the fistula and the cephalic vein seems to be dilating up nicely in the left forearm.  At  this point I believe the patient's fistula is beginning to mature and hopefully will be ready for use in the next few months if necessary. She will return for followup in September of 2012 and we will perform a duplex ultrasound of her fistula at that time to assess maturity.  She will follow up sooner if she has any problems prior to this.    Jessy Oto. Fields, MD Electronically Signed  CEF/MEDQ  D:  09/24/2010  T:  09/25/2010  Job:  [id number]  cc:   Sherril Croon, M.D.

## 2010-10-18 ENCOUNTER — Emergency Department (HOSPITAL_COMMUNITY): Payer: Medicaid Other

## 2010-10-18 ENCOUNTER — Inpatient Hospital Stay (HOSPITAL_COMMUNITY)
Admission: EM | Admit: 2010-10-18 | Discharge: 2010-10-22 | DRG: 683 | Disposition: A | Discharge status: Home / Self Care | Payer: Medicaid Other | Attending: Internal Medicine | Admitting: Internal Medicine

## 2010-10-18 DIAGNOSIS — E1065 Type 1 diabetes mellitus with hyperglycemia: Secondary | ICD-10-CM | POA: Diagnosis present

## 2010-10-18 DIAGNOSIS — N184 Chronic kidney disease, stage 4 (severe): Secondary | ICD-10-CM | POA: Diagnosis present

## 2010-10-18 DIAGNOSIS — E875 Hyperkalemia: Secondary | ICD-10-CM | POA: Diagnosis present

## 2010-10-18 DIAGNOSIS — J45909 Unspecified asthma, uncomplicated: Secondary | ICD-10-CM | POA: Diagnosis present

## 2010-10-18 DIAGNOSIS — Z9119 Patient's noncompliance with other medical treatment and regimen: Secondary | ICD-10-CM

## 2010-10-18 DIAGNOSIS — I129 Hypertensive chronic kidney disease with stage 1 through stage 4 chronic kidney disease, or unspecified chronic kidney disease: Principal | ICD-10-CM | POA: Diagnosis present

## 2010-10-18 DIAGNOSIS — D638 Anemia in other chronic diseases classified elsewhere: Secondary | ICD-10-CM | POA: Diagnosis present

## 2010-10-18 DIAGNOSIS — IMO0002 Reserved for concepts with insufficient information to code with codable children: Secondary | ICD-10-CM | POA: Diagnosis present

## 2010-10-18 DIAGNOSIS — Z7982 Long term (current) use of aspirin: Secondary | ICD-10-CM

## 2010-10-18 DIAGNOSIS — Z91199 Patient's noncompliance with other medical treatment and regimen due to unspecified reason: Secondary | ICD-10-CM

## 2010-10-18 DIAGNOSIS — E871 Hypo-osmolality and hyponatremia: Secondary | ICD-10-CM | POA: Diagnosis present

## 2010-10-18 DIAGNOSIS — I498 Other specified cardiac arrhythmias: Secondary | ICD-10-CM | POA: Diagnosis present

## 2010-10-18 DIAGNOSIS — E785 Hyperlipidemia, unspecified: Secondary | ICD-10-CM | POA: Diagnosis present

## 2010-10-18 DIAGNOSIS — N2581 Secondary hyperparathyroidism of renal origin: Secondary | ICD-10-CM | POA: Diagnosis present

## 2010-10-18 DIAGNOSIS — Z794 Long term (current) use of insulin: Secondary | ICD-10-CM

## 2010-10-18 LAB — CBC
MCHC: 33.6 g/dL (ref 30.0–36.0)
Platelets: 295 10*3/uL (ref 150–400)
RDW: 13.2 % (ref 11.5–15.5)

## 2010-10-19 LAB — BASIC METABOLIC PANEL
Chloride: 106 mEq/L (ref 96–112)
Creatinine, Ser: 3.6 mg/dL — ABNORMAL HIGH (ref 0.50–1.10)
GFR calc Af Amer: 16 mL/min — ABNORMAL LOW (ref >60)
GFR calc Af Amer: 18 mL/min — ABNORMAL LOW (ref >60)
GFR calc non Af Amer: 13 mL/min — ABNORMAL LOW (ref >60)
GFR calc non Af Amer: 14 mL/min — ABNORMAL LOW (ref >60)
Potassium: 5.7 mEq/L — ABNORMAL HIGH (ref 3.5–5.1)
Potassium: 3.8 mEq/L (ref 3.5–5.1)
Sodium: 129 mEq/L — ABNORMAL LOW (ref 135–145)

## 2010-10-19 LAB — DIFFERENTIAL
Basophils Absolute: 0 10*3/uL (ref 0.0–0.1)
Lymphs Abs: 1.4 10*3/uL (ref 0.7–4.0)
Monocytes Absolute: 0.4 10*3/uL (ref 0.1–1.0)
Monocytes Relative: 4 % (ref 3–12)

## 2010-10-19 LAB — GLUCOSE, CAPILLARY
Glucose-Capillary: 217 mg/dL — ABNORMAL HIGH (ref 70–99)
Glucose-Capillary: 179 mg/dL — ABNORMAL HIGH (ref 70–99)
Glucose-Capillary: 229 mg/dL — ABNORMAL HIGH (ref 70–99)
Glucose-Capillary: 119 mg/dL — ABNORMAL HIGH (ref 70–99)

## 2010-10-19 LAB — CARDIAC PANEL(CRET KIN+CKTOT+MB+TROPI)
CK, MB: 1.3 ng/mL (ref 0.3–4.0)
Relative Index: INVALID (ref 0.0–2.5)
Total CK: 77 U/L (ref 7–177)
Troponin I: <0.3 ng/mL (ref <0.30)

## 2010-10-19 LAB — LIPID PANEL
Cholesterol: 232 mg/dL — ABNORMAL HIGH (ref 0–200)
Total CHOL/HDL Ratio: 4.4 RATIO

## 2010-10-19 LAB — HEMOGLOBIN A1C: Mean Plasma Glucose: 272 mg/dL — ABNORMAL HIGH (ref <117)

## 2010-10-19 LAB — PHOSPHORUS: Phosphorus: 3.8 mg/dL (ref 2.3–4.6)

## 2010-10-20 LAB — COMPREHENSIVE METABOLIC PANEL
AST: 12 U/L (ref 0–37)
CO2: 19 mEq/L (ref 19–32)
Calcium: 7.9 mg/dL — ABNORMAL LOW (ref 8.4–10.5)
Creatinine, Ser: 3.8 mg/dL — ABNORMAL HIGH (ref 0.50–1.10)
GFR calc Af Amer: 16 mL/min — ABNORMAL LOW (ref >60)
GFR calc non Af Amer: 14 mL/min — ABNORMAL LOW (ref >60)
Glucose, Bld: 89 mg/dL (ref 70–99)

## 2010-10-20 LAB — GLUCOSE, CAPILLARY: Glucose-Capillary: 133 mg/dL — ABNORMAL HIGH (ref 70–99)

## 2010-10-20 LAB — CBC
HCT: 32 % — ABNORMAL LOW (ref 36.0–46.0)
MCH: 24.9 pg — ABNORMAL LOW (ref 26.0–34.0)
MCHC: 32.8 g/dL (ref 30.0–36.0)
MCV: 76 fL — ABNORMAL LOW (ref 78.0–100.0)
Platelets: 260 10*3/uL (ref 150–400)
RDW: 13.6 % (ref 11.5–15.5)
WBC: 10.9 10*3/uL — ABNORMAL HIGH (ref 4.0–10.5)

## 2010-10-21 LAB — GLUCOSE, CAPILLARY
Glucose-Capillary: 109 mg/dL — ABNORMAL HIGH (ref 70–99)
Glucose-Capillary: 17 mg/dL — CL (ref 70–99)
Glucose-Capillary: 104 mg/dL — ABNORMAL HIGH (ref 70–99)
Glucose-Capillary: 191 mg/dL — ABNORMAL HIGH (ref 70–99)

## 2010-10-22 LAB — GLUCOSE, CAPILLARY
Glucose-Capillary: 101 mg/dL — ABNORMAL HIGH (ref 70–99)
Glucose-Capillary: 83 mg/dL (ref 70–99)

## 2010-11-05 NOTE — Discharge Summary (Signed)
Linda Ellison, Linda Ellison NO.:  [id number]  MEDICAL RECORD NO.:  [id number]  LOCATION:  2039                         FACILITY:  [facility]  PHYSICIAN:  Domenic Polite, MD     DATE OF BIRTH:  [birth year]  DATE OF ADMISSION:  10/18/2010 DATE OF DISCHARGE:                              DISCHARGE SUMMARY   PRIMARY CARE PHYSICIAN:  Lucianne Lei, MD  NEPHROLOGIST:  Sherril Croon, MD  DISCHARGE DIAGNOSES: 1. Hypertensive emergency. 2. Uncontrolled diabetes. 3. Chronic kidney disease, stage IV. 4. History of asthma. 5. Anemia of chronic disease. 6. Secondary hyperparathyroidism 7. History of noncompliance. 8. Dyslipidemia.  DISCHARGE MEDICATIONS: 1. Tylenol 60 mg q.4 h. p.r.n. 2. Simvastatin 20 mg at bedtime. 3. Albuterol inhaler 2 puffs q.4 h. p.r.n. 4. Aspirin 81 mg daily. 5. Norvasc 10 mg daily. 6. Calcium carbonate/vitamin D 600 mg p.o. daily. 7. Gas-X 2 tablets daily as needed. 8. Hydralazine 25 mg t.i.d. 9. Amlodipine 20 mg p.o. b.i.d. 10.Lasix 40 mg daily. 11.NovoLog 70/30, 45 units with breakfast and 35 units at dinner. 12.Renal vitamin 1 tablet daily. 13.Vitamin D 50,000 units 1 capsule monthly.  HOSPITAL COURSE:  [name] is a 35 year old black female with history of type 1 diabetes and chronic kidney disease, stage IV, who has a fistula in place for future dialysis access, presented to the hospital with chest pain and hypertensive emergency. 1. Her chest pain was felt to be secondary to hypertensive emergency.     Blood pressures on admission went to 222/115 early the mercury     range.  Her EKG did show sinus tachycardia without ST-T wave     changes and chest x-ray did not show any acute cardiopulmonary     disease.  She was ruled out for MI based on 3 sets of cardiac     markers and her blood pressure was controlled with IV labetalol and     subsequently after resuming all her home medicines, her blood     pressure stabilized.  This was the  cause of this hypertensive     urgency because the patient did not have access to her medications     for close to 2 days, which is apparently locked up in some grudge     and the owners are out of town. 2. Uncontrolled hyperglycemia and type 1 diabetic.  She did not have     evidence of diabetic ketoacidosis.  However, was treated with a     Glucommander and subsequently sugars stabilized with restarting her     NovoLog 70/30, hemoglobin A1c was 10.3. 3. Chronic kidney disease, stage IV with AV fistula in place.     Creatinine is stable in the mid 3 range. 4. Dyslipidemia.  The patient started on low-dose statin and is being     discharged home today to follow up with primary physician, Dr.     Lucianne Lei in a week and her nephrologist, Dr. Edrick Oh.  She     has a followup with him on November 20, 2010.     Domenic Polite, MD     PJ/MEDQ  D:  10/21/2010  T:  10/21/2010  Job:  [id number]  cc:   Lucianne Lei, M.D. Sherril Croon, M.D.  Electronically Signed by Domenic Polite  on 11/05/2010 04:13:55 PM

## 2010-11-17 ENCOUNTER — Encounter: Payer: Self-pay | Admitting: Vascular Surgery

## 2010-12-02 ENCOUNTER — Encounter: Payer: Self-pay | Admitting: Vascular Surgery

## 2010-12-03 ENCOUNTER — Other Ambulatory Visit: Payer: Medicaid Other

## 2010-12-03 ENCOUNTER — Ambulatory Visit: Payer: Medicaid Other | Admitting: Vascular Surgery

## 2010-12-10 ENCOUNTER — Ambulatory Visit
Admission: RE | Admit: 2010-12-10 | Discharge: 2010-12-10 | Disposition: A | Discharge status: Home / Self Care | Payer: Medicaid Other | Source: Ambulatory Visit | Attending: Family Medicine | Admitting: Family Medicine

## 2010-12-10 ENCOUNTER — Other Ambulatory Visit: Payer: Self-pay | Admitting: Family Medicine

## 2010-12-10 DIAGNOSIS — R52 Pain, unspecified: Secondary | ICD-10-CM

## 2010-12-23 ENCOUNTER — Ambulatory Visit (INDEPENDENT_AMBULATORY_CARE_PROVIDER_SITE_OTHER): Payer: Medicaid Other | Admitting: Vascular Surgery

## 2010-12-23 DIAGNOSIS — Z48812 Encounter for surgical aftercare following surgery on the circulatory system: Secondary | ICD-10-CM

## 2010-12-23 DIAGNOSIS — T82598A Other mechanical complication of other cardiac and vascular devices and implants, initial encounter: Secondary | ICD-10-CM

## 2010-12-24 ENCOUNTER — Emergency Department (HOSPITAL_COMMUNITY)
Admission: EM | Admit: 2010-12-24 | Discharge: 2010-12-24 | Disposition: A | Discharge status: Home / Self Care | Payer: Medicaid Other | Attending: Emergency Medicine | Admitting: Emergency Medicine

## 2010-12-24 ENCOUNTER — Emergency Department (HOSPITAL_COMMUNITY): Payer: Medicaid Other

## 2010-12-24 DIAGNOSIS — E119 Type 2 diabetes mellitus without complications: Secondary | ICD-10-CM | POA: Insufficient documentation

## 2010-12-24 DIAGNOSIS — I509 Heart failure, unspecified: Secondary | ICD-10-CM | POA: Insufficient documentation

## 2010-12-24 DIAGNOSIS — R34 Anuria and oliguria: Secondary | ICD-10-CM | POA: Insufficient documentation

## 2010-12-24 DIAGNOSIS — N289 Disorder of kidney and ureter, unspecified: Secondary | ICD-10-CM | POA: Insufficient documentation

## 2010-12-24 DIAGNOSIS — Z9889 Other specified postprocedural states: Secondary | ICD-10-CM | POA: Insufficient documentation

## 2010-12-24 DIAGNOSIS — Z79899 Other long term (current) drug therapy: Secondary | ICD-10-CM | POA: Insufficient documentation

## 2010-12-24 DIAGNOSIS — M79609 Pain in unspecified limb: Secondary | ICD-10-CM | POA: Insufficient documentation

## 2010-12-24 DIAGNOSIS — R5381 Other malaise: Secondary | ICD-10-CM | POA: Insufficient documentation

## 2010-12-24 DIAGNOSIS — I10 Essential (primary) hypertension: Secondary | ICD-10-CM | POA: Insufficient documentation

## 2010-12-24 LAB — CBC
Hemoglobin: 14
MCH: 24.2 pg — ABNORMAL LOW (ref 26.0–34.0)
MCV: 77.8 — ABNORMAL LOW
MCV: 75.8 fL — ABNORMAL LOW (ref 78.0–100.0)
Platelets: 412 10*3/uL — ABNORMAL HIGH (ref 150–400)
RBC: 5.54 — ABNORMAL HIGH
RBC: 4.09 MIL/uL (ref 3.87–5.11)
WBC: 6.9

## 2010-12-24 LAB — DIFFERENTIAL
Eosinophils Absolute: 0
Eosinophils Absolute: 0.1 10*3/uL (ref 0.0–0.7)
Lymphs Abs: 1.9
Lymphs Abs: 2.4 10*3/uL (ref 0.7–4.0)
Monocytes Absolute: 0.6
Monocytes Relative: 9
Monocytes Relative: 7 % (ref 3–12)
Neutro Abs: 4.3
Neutrophils Relative %: 63
Neutrophils Relative %: 69 % (ref 43–77)

## 2010-12-24 LAB — URINALYSIS, ROUTINE W REFLEX MICROSCOPIC
Bilirubin Urine: NEGATIVE
Bilirubin Urine: NEGATIVE
Ketones, ur: NEGATIVE mg/dL
Nitrite: NEGATIVE
Protein, ur: >300 mg/dL — AB
Specific Gravity, Urine: 1.035 — ABNORMAL HIGH
Urobilinogen, UA: 0.2 mg/dL (ref 0.0–1.0)
pH: 5.5

## 2010-12-24 LAB — I-STAT 8, (EC8 V) (CONVERTED LAB)
Acid-base deficit: 3 — ABNORMAL HIGH
BUN: 15
Bicarbonate: 20.6
Glucose, Bld: 351 — ABNORMAL HIGH
HCT: 42
Hemoglobin: 15.6 — ABNORMAL HIGH
Operator id: 272551
Potassium: 4.1
Sodium: 136
TCO2: 22
pCO2, Ven: 31.5 — ABNORMAL LOW

## 2010-12-24 LAB — COMPREHENSIVE METABOLIC PANEL
ALT: 12 U/L (ref 0–35)
Alkaline Phosphatase: 58 U/L (ref 39–117)
CO2: 25 mEq/L (ref 19–32)
Calcium: 8.7 mg/dL (ref 8.4–10.5)
Chloride: 99 mEq/L (ref 96–112)
GFR calc Af Amer: 13 mL/min — ABNORMAL LOW (ref >60)
GFR calc non Af Amer: 11 mL/min — ABNORMAL LOW (ref >60)
Glucose, Bld: 60 mg/dL — ABNORMAL LOW (ref 70–99)
Potassium: 3.4 mEq/L — ABNORMAL LOW (ref 3.5–5.1)
Sodium: 137 mEq/L (ref 135–145)
Total Bilirubin: 0.1 mg/dL — ABNORMAL LOW (ref 0.3–1.2)

## 2010-12-24 LAB — HEPATIC FUNCTION PANEL
AST: 74 — ABNORMAL HIGH
Albumin: 2.1 — ABNORMAL LOW
Alkaline Phosphatase: 94
Total Bilirubin: 0.6

## 2010-12-24 LAB — GLUCOSE, CAPILLARY: Glucose-Capillary: 124 mg/dL — ABNORMAL HIGH (ref 70–99)

## 2010-12-24 LAB — PRO B NATRIURETIC PEPTIDE: Pro B Natriuretic peptide (BNP): 429.5 pg/mL — ABNORMAL HIGH (ref 0–125)

## 2010-12-24 LAB — URINE MICROSCOPIC-ADD ON

## 2010-12-24 LAB — POCT I-STAT CREATININE: Creatinine, Ser: 0.9

## 2010-12-25 LAB — GLUCOSE, CAPILLARY: Glucose-Capillary: 80 mg/dL (ref 70–99)

## 2010-12-28 ENCOUNTER — Encounter: Payer: Self-pay | Admitting: Vascular Surgery

## 2010-12-29 ENCOUNTER — Encounter: Payer: Self-pay | Admitting: Vascular Surgery

## 2010-12-29 ENCOUNTER — Ambulatory Visit (INDEPENDENT_AMBULATORY_CARE_PROVIDER_SITE_OTHER): Payer: Medicaid Other | Admitting: Vascular Surgery

## 2010-12-29 DIAGNOSIS — N186 End stage renal disease: Secondary | ICD-10-CM

## 2010-12-29 NOTE — Progress Notes (Signed)
Subjective:     Patient ID: Linda Ellison, female   DOB: April 16, 1975, 35 y.o.   MRN: [id number]  HPI this patient with chronic renal insufficiency not yet on hemodialysis. She had fistula created by Dr. Oneida Alar and a eighth in this year. She was referred back to reevaluate for maturation of the fistula. She did have a fistula scan in the vascular lab today. Unfortunately her appointment was made on the wrong day for Dr. Oneida Alar. She does have a bifurcation of the cephalic vein in her midforearm into 2 major segments. She will return on Thursday of this week for Dr. Oneida Alar to evaluate and determine further treatment options not be charged for today's appointment  Review of Systems     Objective:   Physical Exam     Assessment:    non-maturing AV fistula    Plan:     Returned September 27 to see Dr. Eden Lathe

## 2010-12-30 ENCOUNTER — Other Ambulatory Visit (HOSPITAL_COMMUNITY)
Admission: RE | Admit: 2010-12-30 | Discharge: 2010-12-30 | Disposition: A | Discharge status: Home / Self Care | Payer: Medicaid Other | Source: Ambulatory Visit | Attending: Family Medicine | Admitting: Family Medicine

## 2010-12-30 ENCOUNTER — Encounter: Payer: Self-pay | Admitting: Vascular Surgery

## 2010-12-30 ENCOUNTER — Other Ambulatory Visit: Payer: Self-pay | Admitting: Family Medicine

## 2010-12-30 DIAGNOSIS — Z01419 Encounter for gynecological examination (general) (routine) without abnormal findings: Secondary | ICD-10-CM | POA: Insufficient documentation

## 2010-12-30 DIAGNOSIS — R87810 Cervical high risk human papillomavirus (HPV) DNA test positive: Secondary | ICD-10-CM | POA: Insufficient documentation

## 2010-12-30 NOTE — Procedures (Unsigned)
VASCULAR LAB EXAM  INDICATION:  Nonmaturing left arteriovenous fistula.  HISTORY: Diabetes:  Yes Cardiac:  CHF Hypertension:  Yes  EXAM:  Left upper extremity radiocephalic fistula duplex.  IMPRESSION: 1. Two branches present at the mid forearm segment with fistula-like     turbulence present. 2. The proximal/mid forearm left cephalic vein bifurcates, 1 medial     and 1 lateral. 3. The left cephalic at the shoulder level becomes spontaneous and     phasic. 4. The anastomosis at the wrist presents with high-grade stenosis and     a peak systolic velocity of greater than 912 cm/s. 5. Left arterial outflow appears retrograde.  ___________________________________________ Nelda Severe. Kellie Simmering, M.D.  SH/MEDQ  D:  12/23/2010  T:  12/23/2010  Job:  [id number]

## 2010-12-31 ENCOUNTER — Encounter: Payer: Self-pay | Admitting: Vascular Surgery

## 2010-12-31 ENCOUNTER — Ambulatory Visit (INDEPENDENT_AMBULATORY_CARE_PROVIDER_SITE_OTHER): Payer: Medicaid Other | Admitting: Vascular Surgery

## 2010-12-31 VITALS — BP 156/87 | HR 92 | Resp 20 | Ht 66.0 in | Wt 232.2 lb

## 2010-12-31 DIAGNOSIS — N186 End stage renal disease: Secondary | ICD-10-CM

## 2010-12-31 NOTE — Progress Notes (Signed)
Patient is a 35 year old female who previously had a left radiocephalic AV fistula placed 08/11/2010. She returns today to check maturity of the fistula. She is currently not on hemodialysis. She denies any numbness tingling or aching in her left hand. She had a duplex of her fistula on September 19 which I reviewed today. This shows that the diameter of the fistula is of reasonable size up to 8 mm in its midportion. There are a few small side branches and also increased velocity at the proximal anastomosis suggesting possible stenosis.  Review of systems: Denies shortness of breath, denies chest pain  Physical Exam: Filed Vitals:   12/31/10 1000  BP: 156/87  Pulse: 92  Resp: 20  Height: 5\' 6"  (1.676 m)  Weight: 232 lb 3.2 oz (105.325 kg)    Left upper extremity: Well-healed radial incision, easily palpable thrill fistula is palpable to the distal forearm but is deeper in the proximal forearm  Assessment: Patient currently not on hemodialysis but her fistula is not really usable and therefore distal segment. This is secondary to multiple reasons including depth of the vein as well as side branches as well as possible proximal narrowing.  Plan: Superficialization ligation of side branches and possible revision on 01/04/2011 risk benefits possible complications and procedure details explained the patient today including but limited to bleeding infection non-maturation of fistula

## 2011-01-04 ENCOUNTER — Ambulatory Visit (HOSPITAL_COMMUNITY)
Admission: RE | Admit: 2011-01-04 | Discharge: 2011-01-04 | Disposition: A | Discharge status: Home / Self Care | Payer: Medicaid Other | Source: Ambulatory Visit | Attending: Vascular Surgery | Admitting: Vascular Surgery

## 2011-01-04 DIAGNOSIS — K219 Gastro-esophageal reflux disease without esophagitis: Secondary | ICD-10-CM | POA: Insufficient documentation

## 2011-01-04 DIAGNOSIS — T82598A Other mechanical complication of other cardiac and vascular devices and implants, initial encounter: Secondary | ICD-10-CM | POA: Insufficient documentation

## 2011-01-04 DIAGNOSIS — E669 Obesity, unspecified: Secondary | ICD-10-CM | POA: Insufficient documentation

## 2011-01-04 DIAGNOSIS — I120 Hypertensive chronic kidney disease with stage 5 chronic kidney disease or end stage renal disease: Secondary | ICD-10-CM | POA: Insufficient documentation

## 2011-01-04 DIAGNOSIS — E119 Type 2 diabetes mellitus without complications: Secondary | ICD-10-CM | POA: Insufficient documentation

## 2011-01-04 DIAGNOSIS — Y832 Surgical operation with anastomosis, bypass or graft as the cause of abnormal reaction of the patient, or of later complication, without mention of misadventure at the time of the procedure: Secondary | ICD-10-CM | POA: Insufficient documentation

## 2011-01-04 DIAGNOSIS — J45909 Unspecified asthma, uncomplicated: Secondary | ICD-10-CM | POA: Insufficient documentation

## 2011-01-04 DIAGNOSIS — Z794 Long term (current) use of insulin: Secondary | ICD-10-CM | POA: Insufficient documentation

## 2011-01-04 DIAGNOSIS — N186 End stage renal disease: Secondary | ICD-10-CM | POA: Insufficient documentation

## 2011-01-04 DIAGNOSIS — T82898A Other specified complication of vascular prosthetic devices, implants and grafts, initial encounter: Secondary | ICD-10-CM

## 2011-01-04 HISTORY — PX: LIGATION GORETEX FISTULA: SHX5155

## 2011-01-04 LAB — GLUCOSE, CAPILLARY
Glucose-Capillary: 191 mg/dL — ABNORMAL HIGH (ref 70–99)
Glucose-Capillary: 182 mg/dL — ABNORMAL HIGH (ref 70–99)

## 2011-01-04 LAB — HCG, SERUM, QUALITATIVE: Preg, Serum: NEGATIVE

## 2011-01-04 LAB — SURGICAL PCR SCREEN: Staphylococcus aureus: POSITIVE — AB

## 2011-01-04 LAB — POCT I-STAT 4, (NA,K, GLUC, HGB,HCT): Potassium: 3.7 mEq/L (ref 3.5–5.1)

## 2011-01-06 NOTE — Op Note (Signed)
  Linda Ellison, Linda Ellison                ACCOUNT NO.:  [id number]  MEDICAL RECORD NO.:  [id number]  LOCATION:  [facility]                         FACILITY:  [facility]  PHYSICIAN:  Jessy Oto. Yomaira Solar, MD  DATE OF BIRTH:  Aug 08, 1975  DATE OF PROCEDURE:  01/04/2011 DATE OF DISCHARGE:  01/04/2011                              OPERATIVE REPORT   PROCEDURE:  Ligation of multiple side branches and superficialization of left radiocephalic AV fistula.  PREOPERATIVE DIAGNOSIS:  Nonmaturing arteriovenous fistula, left arm.  POSTOPERATIVE DIAGNOSIS:  Nonmaturing arteriovenous fistula, left arm.  ANESTHESIA:  Local with IV sedation.  ASSISTANT:  Carlyn Reichert, RNFA.  OPERATIVE FINDINGS: 1. Multiple side branches, left arm AV fistula. 2. Fairly deep fistula, but with proximal aspect of fistula     approximately 12 mm in diameter.  OPERATIVE DETAILS:  After obtaining informed consent, the patient was taken to the operating room.  The patient was placed in supine position on operating table.  After adequate sedation, the patient's entire left upper extremity was prepped and draped in usual sterile fashion.  Local anesthesia was infiltrated at a preexisting longitudinal scar just over the radiocephalic anastomosis.  Incision was carried down through the subcutaneous tissues down to the level of the cephalic vein.  Cephalic vein was dissected free circumferentially.  Dissection was carried down to the level of the arterial anastomosis.  The artery was dissected free proximal and distal to the anastomosis.  The vein was slightly smaller caliber here than in the more distal fistula.  However, the fistula had dilated already to 12 mm in diameter and I felt that there was adequate inflow on palpation and on review of this.  Therefore, the arterial anastomosis was not revised.  I then proceeded to the superficialize the AV fistula from the wrist all the way to the antecubital region.  The vein was of good quality  throughout.  There were multiple large side branches.  All these were ligated and divided between silk ties or clips.  The superficialization was completed through two additional skip incisions.  The subcutaneous fat underneath the skin bridges was also debrided away so that this fistula could rise to the surface of the skin.  The fistula was dissected free circumferentially also so that it could rise to the surface of the skin.  The subcutaneous tissues were then loosely reapproximated with interrupted 3-0 Vicryl sutures.  The skin of all three incisions were then closed with a 4-0 Vicryl subcuticular stitch.  The patient tolerated the procedure well and there were no complications. Instrument, sponge, and needle counts were correct at the end of the case.  The patient was taken to the recovery room in stable condition. Dermabond was applied to all incisions.     Jessy Oto. Dafney Farler, MD     CEF/MEDQ  D:  01/04/2011  T:  01/04/2011  Job:  [id number]  Electronically Signed by Ruta Hinds MD on 01/06/2011 11:42:30 AM

## 2011-01-07 ENCOUNTER — Ambulatory Visit: Payer: Medicaid Other | Admitting: Vascular Surgery

## 2011-01-07 ENCOUNTER — Other Ambulatory Visit: Payer: Medicaid Other

## 2011-01-08 ENCOUNTER — Emergency Department (HOSPITAL_COMMUNITY)
Admission: EM | Admit: 2011-01-08 | Discharge: 2011-01-08 | Disposition: A | Discharge status: Home / Self Care | Payer: Medicaid Other | Attending: Emergency Medicine | Admitting: Emergency Medicine

## 2011-01-08 DIAGNOSIS — E119 Type 2 diabetes mellitus without complications: Secondary | ICD-10-CM | POA: Insufficient documentation

## 2011-01-08 DIAGNOSIS — Z79899 Other long term (current) drug therapy: Secondary | ICD-10-CM | POA: Insufficient documentation

## 2011-01-08 DIAGNOSIS — I10 Essential (primary) hypertension: Secondary | ICD-10-CM | POA: Insufficient documentation

## 2011-01-08 DIAGNOSIS — G8918 Other acute postprocedural pain: Secondary | ICD-10-CM | POA: Insufficient documentation

## 2011-01-08 DIAGNOSIS — I509 Heart failure, unspecified: Secondary | ICD-10-CM | POA: Insufficient documentation

## 2011-01-08 DIAGNOSIS — M7989 Other specified soft tissue disorders: Secondary | ICD-10-CM | POA: Insufficient documentation

## 2011-01-08 DIAGNOSIS — J45909 Unspecified asthma, uncomplicated: Secondary | ICD-10-CM | POA: Insufficient documentation

## 2011-01-16 NOTE — H&P (Signed)
Linda Ellison, Linda Ellison                ACCOUNT NO.:  [id number]  MEDICAL RECORD NO.:  [id number]  LOCATION:  [facility]                         FACILITY:  [facility]  PHYSICIAN:  Barbette Merino, M.D.      DATE OF BIRTH:  29-Nov-1975  DATE OF ADMISSION:  10/18/2010 DATE OF DISCHARGE:                             HISTORY & PHYSICAL   PRIMARY CARE PHYSICIAN: Unassigned  NEPHROLOGIST:  Dr. Justin Mend  PRESENTING COMPLAINT:  Chest pain and left upper extremity numbness.  HISTORY OF PRESENT ILLNESS:  The patient is a 35 year old female with known history of diabetes, hypertension and stage IV chronic kidney disease who is predialysis, but has not started any hemodialysis yet. She just had a fistula in place for process of dialysis, also has CHF from her chronic kidney disease.  The patient apparently was staying with the family who according to her locked her out and therefore she has been unable to get her medications since Saturday.  She has been out for at least 2 days and started having chest discomfort in the left side associated with numbness in her left hand and cramping.  Denied any shortness of breath at rest, but she has some exertional dyspnea and PND.  Denied any fever.  No cough.  The pain is currently 5/10, again radiating down the left arm.  She had no prior history of coronary artery disease, although she has family history of coronary artery disease.  Her blood pressure on arrival was elevated, but responded to nitro.  PAST MEDICAL HISTORY:  Significant for accelerated hypertension, uncontrolled diabetes, chronic kidney disease stage IV, asthma, CHF, diastolic dysfunction with her EF being 55-60% in December 2011.  ALLERGIES:  No known drug allergies.  MEDICATIONS:  Include; 1. Vitamin D 50,000 units monthly. 2. Renal vitamin 1 tablet daily. 3. NovoLog insulin 70/30.  She takes 35 units with breakfast and 35     units at dinner. 4. Lasix 40 mg daily. 5. Labetalol 200 mg twice  daily. 6. Hydralazine 25 mg 3 times a day. 7. Over-the-counter Gas-X as needed. 8. Calcium carbonate 600 mg with vitamin D 1 tablet daily. 9. Aspirin 81 mg daily. 10.Amlodipine 10 mg daily. 11.Albuterol inhaler as needed.  SOCIAL HISTORY:  The patient lives with family and have a partner.  No tobacco, alcohol or IV drug use.  FAMILY HISTORY:  Significant for coronary artery disease at young age, diabetes and hypertension.  PAST SURGICAL HISTORY:  Cesarean section and cholecystectomy.  REVIEW OF SYSTEMS:  All systems reviewed are negative except per HPI,  PHYSICAL EXAMINATION:  VITAL SIGNS:  Temperature 98.2, blood pressure was 207/1125 on arrival with a pulse of 116 and respiratory rate 18, sats 100% room air. GENERAL:  She is awake, alert, oriented, pleasant woman.  She is in no acute distress. HEENT:  PERRL.  EOMI.  No pallor.  No jaundice.  No rhinorrhea. NECK:  Supple.  No JVD.  No lymphadenopathy. RESPIRATORY:  Good air entry bilaterally.  No wheezes.  No rales.  No crackles. CARDIOVASCULAR:  She is tachycardic. ABDOMEN:  Soft and nontender with positive bowel sounds. EXTREMITIES:  No edema, cyanosis or clubbing. SKIN:  No rashes or ulcers.  LABORATORY DATA:  White count is 10.6 with a left shift, ANC of 8.8, hemoglobin 10.9 with an MCV of 75 and platelet count 295.  Her sodium is 129, potassium 5.7, chloride 97, CO2 of 20.  Her glucose is 547, BUN 32, creatinine 3.85 with a calcium of 8.3.  Initial cardiac enzymes are negative.  Her chest x-ray showed no acute cardiopulmonary disease.  EKG shows sinus tachycardia with no significant ST-T wave changes.  ASSESSMENT:  This is a 35 year old with known history of hypertension and diabetes that is here with uncontrolled hypertensive urgency and hyperglycemia, but not in diabetic ketoacidosis, more than likely due to patient not taking her medicine for 2 days.  Plan therefore will be; 1. Chest pain from hypertensive urgency.   Goal is to control the blood     pressure.  Already, she responded to initial treatment in the ED,     so I am going to continue with her home medication and then give     her p.r.n. IV labetalol if needed and we will titrate her     medication as needed. 2. Uncontrolled diabetes with hyperglycemia.  Patient is not in     diabetic ketoacidosis and she just missed her doses for 2 days.  I     will start with the 70/30, add sliding scale insulin if she is not     responding, then we will consider insulin drip.  In the meantime,     cautiously I will hydrate her to bring the sugar down gradually. 3. Asthma.  She is stable now, not wheezing.  I will keep on empiric     nebulizers. 4. Chronic kidney disease.  Again, the patient is being prepped for     possible dialysis.  She just had a fistula placed.  Her baseline     creatinine is around 3 and she is close to that at this point. 5. Pseudohyponatremia from her high glucose.  We will hydrate her and     once the glucose is corrected, hopefully this will take care with     the problem. 6. Hyperkalemia, again related to her kidney disease.  With IV     insulin, I except the potassium to correct, but we will follow it     closely.     Barbette Merino, M.D.     Scot Dock  D:  10/19/2010  T:  10/19/2010  Job:  [id number]  Electronically Signed by Barbette Merino M.D. on 01/16/2011 02:45:51 PM

## 2011-01-19 ENCOUNTER — Encounter: Payer: Self-pay | Admitting: Thoracic Diseases

## 2011-01-20 ENCOUNTER — Ambulatory Visit (INDEPENDENT_AMBULATORY_CARE_PROVIDER_SITE_OTHER): Payer: Medicaid Other | Admitting: Thoracic Diseases

## 2011-01-20 VITALS — BP 137/91 | HR 101 | Temp 98.4°F | Ht 66.0 in | Wt 236.0 lb

## 2011-01-20 DIAGNOSIS — N186 End stage renal disease: Secondary | ICD-10-CM

## 2011-01-20 DIAGNOSIS — T82898A Other specified complication of vascular prosthetic devices, implants and grafts, initial encounter: Secondary | ICD-10-CM

## 2011-01-20 NOTE — Progress Notes (Signed)
[facility]  Postoperative Visit hemodialysis access    Surgeon: Cecille Amsterdam, MD HD Center: Pt to see Dr. Justin Mend 01/21/2009, not yet on HD  HPI: Linda Ellison is a 35 y.o. female who is 2 weeks S/P/ left upper extremity superficialization and ligation of competing branches in left ciminoHemodialysis access. The patient complains of symptoms of numbness, left thumb numbness and intermittent sharp pain into thumb and complains of pain in the operative limb with palpation at the upper incision. Patient is here for post -op evaluation to assess healing and maturation of left Cimino AVF. Pt. Also C/O DOE and increasing swelling in both feet. She denies chest pain. She is now on 80 mg of Lasix daily but states she is not making much urine.   She has an appointment with the Transplant service as well at the end of the month.  Pt is not on hemodialysis  Physical Examination  Filed Vitals:   01/20/11 1232  BP: 137/91  Pulse: 101  Temp: 98.4 F (36.9 C)    WDWN female in NAD. Lungs are clear without wheezes or rales left upper extremity Incision is clean, dry, intact with min separation in the upper incision. There is no drainage or redness at any of the sites.  several stitch protrusions were removed. Skin color is normal   Hand grip is 5/5 and sensation in digits is intact; There is a good thrill and good bruit in the left cimino AVF. The graft/fistula is easily palpable and of adequate size She has some tenderness to palpation at the upper incision.  Assessment/Plan Linda Ellison is a 35 y.o. year old female who presents s/p superficialization/revision of left cimino AVF. The access has a good thrill and bruit and is of adequate size  the numbness and tenderness over the thumb is secondary to nerve irritation at the radial cephalic vein and should resolve over time. Follow-up as needed. Pt. To contact PMD or Dr. Justin Mend re: increasing DOE. If she developes  chest pain she should go to the ER  The patient's access will be ready for use in immediately between the incisions, avoiding the most proximal incision site until it is completely healed  Clinic MD:CS Scot Dock. MD

## 2011-01-20 NOTE — Patient Instructions (Signed)
Call PMD or nephrologist re increasing DOE.

## 2011-01-26 DIAGNOSIS — E1121 Type 2 diabetes mellitus with diabetic nephropathy: Secondary | ICD-10-CM | POA: Insufficient documentation

## 2011-01-26 DIAGNOSIS — J45909 Unspecified asthma, uncomplicated: Secondary | ICD-10-CM | POA: Insufficient documentation

## 2011-01-26 DIAGNOSIS — Z7682 Awaiting organ transplant status: Secondary | ICD-10-CM | POA: Insufficient documentation

## 2011-01-26 DIAGNOSIS — I509 Heart failure, unspecified: Secondary | ICD-10-CM | POA: Insufficient documentation

## 2011-01-26 DIAGNOSIS — E119 Type 2 diabetes mellitus without complications: Secondary | ICD-10-CM | POA: Insufficient documentation

## 2011-01-26 DIAGNOSIS — E1129 Type 2 diabetes mellitus with other diabetic kidney complication: Secondary | ICD-10-CM | POA: Insufficient documentation

## 2011-01-26 DIAGNOSIS — J45901 Unspecified asthma with (acute) exacerbation: Secondary | ICD-10-CM | POA: Insufficient documentation

## 2011-01-26 DIAGNOSIS — E109 Type 1 diabetes mellitus without complications: Secondary | ICD-10-CM | POA: Insufficient documentation

## 2011-03-13 ENCOUNTER — Other Ambulatory Visit: Payer: Self-pay | Admitting: Internal Medicine

## 2011-04-16 ENCOUNTER — Emergency Department (HOSPITAL_COMMUNITY)
Admission: EM | Admit: 2011-04-16 | Discharge: 2011-04-17 | Disposition: A | Discharge status: Home / Self Care | Payer: Medicare Other | Attending: Emergency Medicine | Admitting: Emergency Medicine

## 2011-04-16 DIAGNOSIS — N189 Chronic kidney disease, unspecified: Secondary | ICD-10-CM | POA: Insufficient documentation

## 2011-04-16 DIAGNOSIS — E119 Type 2 diabetes mellitus without complications: Secondary | ICD-10-CM | POA: Insufficient documentation

## 2011-04-16 DIAGNOSIS — Z992 Dependence on renal dialysis: Secondary | ICD-10-CM | POA: Insufficient documentation

## 2011-04-16 DIAGNOSIS — M5430 Sciatica, unspecified side: Secondary | ICD-10-CM | POA: Insufficient documentation

## 2011-04-16 DIAGNOSIS — N390 Urinary tract infection, site not specified: Secondary | ICD-10-CM | POA: Insufficient documentation

## 2011-04-16 DIAGNOSIS — Z794 Long term (current) use of insulin: Secondary | ICD-10-CM | POA: Insufficient documentation

## 2011-04-16 DIAGNOSIS — I129 Hypertensive chronic kidney disease with stage 1 through stage 4 chronic kidney disease, or unspecified chronic kidney disease: Secondary | ICD-10-CM | POA: Insufficient documentation

## 2011-04-17 ENCOUNTER — Encounter (HOSPITAL_COMMUNITY): Payer: Self-pay | Admitting: Family Medicine

## 2011-04-17 LAB — URINALYSIS, ROUTINE W REFLEX MICROSCOPIC
Glucose, UA: 100 mg/dL — AB
Hgb urine dipstick: NEGATIVE
Ketones, ur: NEGATIVE mg/dL
pH: 8 (ref 5.0–8.0)

## 2011-04-17 LAB — URINE CULTURE
Colony Count: 3000
Culture  Setup Time: 201301120523

## 2011-04-17 LAB — URINE MICROSCOPIC-ADD ON

## 2011-04-17 MED ORDER — ONDANSETRON 8 MG PO TBDP
8.0000 mg | ORAL_TABLET | Freq: Once | ORAL | Status: AC
  Administered 2011-04-17: 8 mg via ORAL
  Filled 2011-04-17: qty 1

## 2011-04-17 MED ORDER — DIAZEPAM 5 MG PO TABS: 5.0000 mg | ORAL_TABLET | Freq: Two times a day (BID) | ORAL | Status: AC

## 2011-04-17 MED ORDER — HYDROMORPHONE HCL PF 1 MG/ML IJ SOLN
1.0000 mg | Freq: Once | INTRAMUSCULAR | Status: AC
  Administered 2011-04-17: 1 mg via INTRAMUSCULAR
  Filled 2011-04-17: qty 1

## 2011-04-17 MED ORDER — DIAZEPAM 5 MG PO TABS
5.0000 mg | ORAL_TABLET | Freq: Once | ORAL | Status: AC
  Administered 2011-04-17: 5 mg via ORAL
  Filled 2011-04-17: qty 1

## 2011-04-17 MED ORDER — DIAZEPAM 5 MG/ML IJ SOLN: 5.0000 mg | Freq: Once | INTRAMUSCULAR | Status: DC

## 2011-04-17 MED ORDER — PREDNISONE 20 MG PO TABS
60.0000 mg | ORAL_TABLET | Freq: Once | ORAL | Status: AC
  Administered 2011-04-17: 60 mg via ORAL
  Filled 2011-04-17: qty 3

## 2011-04-17 MED ORDER — HYDROCODONE-ACETAMINOPHEN 5-325 MG PO TABS: 2.0000 | ORAL_TABLET | ORAL | Status: AC | PRN

## 2011-04-17 MED ORDER — CIPROFLOXACIN HCL 500 MG PO TABS
500.0000 mg | ORAL_TABLET | Freq: Once | ORAL | Status: AC
  Administered 2011-04-17: 500 mg via ORAL
  Filled 2011-04-17: qty 1

## 2011-04-17 MED ORDER — CIPROFLOXACIN HCL 500 MG PO TABS: 500.0000 mg | ORAL_TABLET | Freq: Two times a day (BID) | ORAL | Status: AC

## 2011-04-17 NOTE — ED Provider Notes (Signed)
Medical screening examination/treatment/procedure(s) were performed by non-physician practitioner and as supervising physician I was immediately available for consultation/collaboration.  Carlisle Beers, MD 04/17/11 [phone]

## 2011-04-17 NOTE — ED Provider Notes (Signed)
History     CSN: [id number]  Arrival date & time 04/16/11  2318   First MD Initiated Contact with Patient 04/16/11 2359      Chief Complaint  Patient presents with  . Back Pain    w/ radiation down bilat legs x 2 days. denies injury.  dialysis pt-makes very little urine.     HPI: Patient is a 36 y.o. female presenting with back pain.  Back Pain  This is a recurrent problem. The problem occurs constantly. The problem has been gradually worsening. The pain is associated with no known injury. The pain is present in the lumbar spine. The quality of the pain is described as stabbing and shooting. The pain is at a severity of 8/10. The pain is severe.  Patient reports a two-day history of low back pain that radiates into bilateral lower extremities left worse than right. Denies any recent injury, denies any urinary tract symptoms, nausea, vomiting or diarrhea. Denies any vaginal discharge or abdominal pain. States she has had similar pain in the past that radiated to her hip and her PCP placed her on medications for pain and prednisone which really seem to help. Denies loss of bowel or bladder, no perineal numbness or other focal neurological symptoms.   Past Medical History  Diagnosis Date  . Asthma   . Chronic kidney disease   . Weight loss   . Loss of appetite   . CHF (congestive heart failure)   . Hypertension   . Hyperlipidemia   . Diabetes mellitus age 89    uses insulin    Past Surgical History  Procedure Date  . Cesarean section   . Cholecystectomy   . Av fistula placement 08/2010    Left radiocephalic AVF  . Ligation goretex fistula 01/04/11    Left AVF    Family History  Problem Relation Age of Onset  . Heart disease Mother   . Kidney disease Mother   . Heart disease Brother     History  Substance Use Topics  . Smoking status: Never Smoker   . Smokeless tobacco: Not on file  . Alcohol Use: No    OB History    Grav Para Term Preterm Abortions TAB SAB Ect  Mult Living                  Review of Systems  Constitutional: Negative.   HENT: Negative.   Eyes: Negative.   Respiratory: Negative.   Cardiovascular: Negative.   Gastrointestinal: Negative.   Genitourinary: Negative.   Musculoskeletal: Positive for back pain.  Skin: Negative.   Neurological: Negative.   Hematological: Negative.   Psychiatric/Behavioral: Negative.     Allergies  Review of patient's allergies indicates no known allergies.  Home Medications   Current Outpatient Rx  Name Route Sig Dispense Refill  . ALBUTEROL SULFATE HFA 108 (90 BASE) MCG/ACT IN AERS Inhalation Inhale 2 puffs into the lungs every 6 (six) hours as needed.    Marland Kitchen AMLODIPINE BESYLATE 10 MG PO TABS Oral Take 10 mg by mouth daily.      . ASPIRIN 81 MG PO TABS Oral Take 81 mg by mouth daily.      Marland Kitchen CALCIUM CARBONATE ANTACID 500 MG PO CHEW Oral Chew 1 tablet by mouth 3 (three) times daily before meals.    Marland Kitchen CALCIUM + D PO Oral Take 600 mg by mouth daily.      Marland Kitchen CLONIDINE HCL 0.1 MG PO TABS Oral Take 0.1 mg by  mouth 2 (two) times daily.     . ERGOCALCIFEROL 50000 UNITS PO CAPS Oral Take 50,000 Units by mouth every 30 (thirty) days.     Marland Kitchen FLOVENT IN Inhalation Inhale into the lungs 2 (two) times daily.      . INSULIN ASPART PROT & ASPART (70-30) 100 UNIT/ML [place] SUSP Subcutaneous Inject 25-30 Units into the skin 2 (two) times daily with a meal. 30 in the morning and 25 at night.    Marland Kitchen LABETALOL HCL 200 MG PO TABS Oral Take 200 mg by mouth 2 (two) times daily.     Marland Kitchen GAS-X PO Oral Take by mouth as needed.      Marland Kitchen SIMVASTATIN 5 MG PO TABS Oral Take 5 mg by mouth at bedtime.      BP 148/102  Pulse 104  Temp(Src) 98.2 F (36.8 C) (Oral)  Resp 18  Ht 5\' 6"  (1.676 m)  Wt 208 lb (94.348 kg)  BMI 33.57 kg/m2  SpO2 100%  LMP 04/01/2011  Physical Exam  Constitutional: She is oriented to person, place, and time. She appears well-developed and well-nourished.  HENT:  Head: Normocephalic and atraumatic.    Eyes: Conjunctivae are normal.  Neck: Neck supple.  Cardiovascular: Normal rate and regular rhythm.   Pulmonary/Chest: Effort normal and breath sounds normal.  Abdominal: Soft. Bowel sounds are normal.  Musculoskeletal: Normal range of motion.       Back:  Neurological: She is alert and oriented to person, place, and time. She has normal reflexes.  Skin: Skin is warm and dry. No erythema.  Psychiatric: She has a normal mood and affect.    ED Course  Procedures Patient reports pain much improved with medications. Patient able to get up and ambulate in the room, and reports pain much improved. Discussed findings and clinical impression the patient. Discussed risk involved with prednisone therapy, and that we will defer any further prednisone therapy to her PCP. Will plan to discharge home with treatment for UTI, and medications for pain and encourage patient to followup with PCP as soon as can be arranged. Patient agreeable with plan to  Labs Reviewed  URINALYSIS, ROUTINE W REFLEX MICROSCOPIC - Abnormal; Notable for the following:    Glucose, UA 100 (*)    Protein, ur >300 (*)    Leukocytes, UA TRACE (*)    All other components within normal limits  URINE MICROSCOPIC-ADD ON - Abnormal; Notable for the following:    Squamous Epithelial / LPF FEW (*)    All other components within normal limits  POCT PREGNANCY, URINE  POCT PREGNANCY, URINE  URINE CULTURE   No results found.   No diagnosis found.    MDM  HPI/PE and clinical findings c/w 1. Musculoskeletal LBP/sciatica 2. UTI        Jeryl Columbia, NP 04/17/11 [phone]

## 2011-05-03 ENCOUNTER — Ambulatory Visit
Admission: RE | Admit: 2011-05-03 | Discharge: 2011-05-03 | Disposition: A | Discharge status: Home / Self Care | Payer: Medicare Other | Source: Ambulatory Visit | Attending: Nephrology | Admitting: Nephrology

## 2011-05-03 ENCOUNTER — Other Ambulatory Visit: Payer: Self-pay | Admitting: Nephrology

## 2011-05-03 DIAGNOSIS — M549 Dorsalgia, unspecified: Secondary | ICD-10-CM

## 2011-06-03 ENCOUNTER — Ambulatory Visit: Payer: Self-pay | Admitting: Obstetrics and Gynecology

## 2011-06-09 ENCOUNTER — Emergency Department (HOSPITAL_COMMUNITY): Payer: Medicare Other

## 2011-06-09 ENCOUNTER — Observation Stay (HOSPITAL_COMMUNITY)
Admission: EM | Admit: 2011-06-09 | Discharge: 2011-06-10 | Disposition: A | Discharge status: Other Acute Inpatient Hospital | Payer: Medicare Other | Attending: Family Medicine | Admitting: Family Medicine

## 2011-06-09 ENCOUNTER — Encounter (HOSPITAL_COMMUNITY): Payer: Self-pay | Admitting: Emergency Medicine

## 2011-06-09 ENCOUNTER — Other Ambulatory Visit: Payer: Self-pay

## 2011-06-09 DIAGNOSIS — E876 Hypokalemia: Secondary | ICD-10-CM | POA: Insufficient documentation

## 2011-06-09 DIAGNOSIS — Z9089 Acquired absence of other organs: Secondary | ICD-10-CM

## 2011-06-09 DIAGNOSIS — R51 Headache: Secondary | ICD-10-CM | POA: Insufficient documentation

## 2011-06-09 DIAGNOSIS — E785 Hyperlipidemia, unspecified: Secondary | ICD-10-CM | POA: Insufficient documentation

## 2011-06-09 DIAGNOSIS — N189 Chronic kidney disease, unspecified: Secondary | ICD-10-CM

## 2011-06-09 DIAGNOSIS — R112 Nausea with vomiting, unspecified: Secondary | ICD-10-CM | POA: Insufficient documentation

## 2011-06-09 DIAGNOSIS — I120 Hypertensive chronic kidney disease with stage 5 chronic kidney disease or end stage renal disease: Secondary | ICD-10-CM | POA: Insufficient documentation

## 2011-06-09 DIAGNOSIS — I10 Essential (primary) hypertension: Secondary | ICD-10-CM

## 2011-06-09 DIAGNOSIS — N186 End stage renal disease: Secondary | ICD-10-CM | POA: Insufficient documentation

## 2011-06-09 DIAGNOSIS — Z992 Dependence on renal dialysis: Secondary | ICD-10-CM | POA: Insufficient documentation

## 2011-06-09 DIAGNOSIS — Z9189 Other specified personal risk factors, not elsewhere classified: Secondary | ICD-10-CM

## 2011-06-09 DIAGNOSIS — E119 Type 2 diabetes mellitus without complications: Secondary | ICD-10-CM

## 2011-06-09 DIAGNOSIS — E669 Obesity, unspecified: Secondary | ICD-10-CM

## 2011-06-09 DIAGNOSIS — J45909 Unspecified asthma, uncomplicated: Secondary | ICD-10-CM | POA: Insufficient documentation

## 2011-06-09 DIAGNOSIS — R079 Chest pain, unspecified: Principal | ICD-10-CM

## 2011-06-09 DIAGNOSIS — E109 Type 1 diabetes mellitus without complications: Secondary | ICD-10-CM | POA: Insufficient documentation

## 2011-06-09 DIAGNOSIS — K219 Gastro-esophageal reflux disease without esophagitis: Secondary | ICD-10-CM | POA: Insufficient documentation

## 2011-06-09 DIAGNOSIS — G44209 Tension-type headache, unspecified, not intractable: Secondary | ICD-10-CM

## 2011-06-09 DIAGNOSIS — E162 Hypoglycemia, unspecified: Secondary | ICD-10-CM

## 2011-06-09 DIAGNOSIS — E875 Hyperkalemia: Secondary | ICD-10-CM | POA: Insufficient documentation

## 2011-06-09 DIAGNOSIS — Z794 Long term (current) use of insulin: Secondary | ICD-10-CM | POA: Insufficient documentation

## 2011-06-09 DIAGNOSIS — Z8659 Personal history of other mental and behavioral disorders: Secondary | ICD-10-CM | POA: Insufficient documentation

## 2011-06-09 LAB — DIFFERENTIAL
Basophils Relative: 0 % (ref 0–1)
Eosinophils Absolute: 0 10*3/uL (ref 0.0–0.7)
Neutro Abs: 7.8 10*3/uL — ABNORMAL HIGH (ref 1.7–7.7)
Neutrophils Relative %: 70 % (ref 43–77)

## 2011-06-09 LAB — CBC
MCH: 25.4 pg — ABNORMAL LOW (ref 26.0–34.0)
MCHC: 34 g/dL (ref 30.0–36.0)
Platelets: 297 10*3/uL (ref 150–400)
RBC: 4.93 MIL/uL (ref 3.87–5.11)

## 2011-06-09 LAB — COMPREHENSIVE METABOLIC PANEL
Albumin: 3.8 g/dL (ref 3.5–5.2)
BUN: 40 mg/dL — ABNORMAL HIGH (ref 6–23)
Calcium: 9.8 mg/dL (ref 8.4–10.5)
Creatinine, Ser: 5.53 mg/dL — ABNORMAL HIGH (ref 0.50–1.10)
Total Protein: 7.9 g/dL (ref 6.0–8.3)

## 2011-06-09 LAB — CK TOTAL AND CKMB (NOT AT ARMC)
CK, MB: 1 ng/mL (ref 0.3–4.0)
Total CK: 105 U/L (ref 7–177)

## 2011-06-09 LAB — TROPONIN I: Troponin I: <0.3 ng/mL (ref <0.30)

## 2011-06-09 LAB — GLUCOSE, CAPILLARY
Glucose-Capillary: 45 mg/dL — ABNORMAL LOW (ref 70–99)
Glucose-Capillary: 80 mg/dL (ref 70–99)
Glucose-Capillary: 84 mg/dL (ref 70–99)
Glucose-Capillary: 86 mg/dL (ref 70–99)

## 2011-06-09 LAB — CARDIAC PANEL(CRET KIN+CKTOT+MB+TROPI)
CK, MB: 1 ng/mL (ref 0.3–4.0)
Total CK: 85 U/L (ref 7–177)

## 2011-06-09 MED ORDER — DEXTROSE 50 % IV SOLN
1.0000 | Freq: Once | INTRAVENOUS | Status: AC
  Administered 2011-06-09: 50 mL via INTRAVENOUS

## 2011-06-09 MED ORDER — CALCIUM CARBONATE ANTACID 500 MG PO CHEW
1.0000 | CHEWABLE_TABLET | Freq: Three times a day (TID) | ORAL | Status: DC
Start: 2011-06-10 — End: 2011-06-10
  Administered 2011-06-10: 200 mg via ORAL
  Filled 2011-06-09 (×4): qty 1

## 2011-06-09 MED ORDER — PENTAFLUOROPROP-TETRAFLUOROETH EX AERO: 1.0000 "application " | INHALATION_SPRAY | CUTANEOUS | Status: DC | PRN

## 2011-06-09 MED ORDER — SIMVASTATIN 5 MG PO TABS
5.0000 mg | ORAL_TABLET | Freq: Every day | ORAL | Status: DC
  Administered 2011-06-09: 5 mg via ORAL
  Filled 2011-06-09 (×2): qty 1

## 2011-06-09 MED ORDER — NEPRO/CARBSTEADY PO LIQD
237.0000 mL | ORAL | Status: DC | PRN
  Filled 2011-06-09: qty 237

## 2011-06-09 MED ORDER — SODIUM CHLORIDE 0.9 % IV SOLN
25.0000 mg | Freq: Once | INTRAVENOUS | Status: DC
  Filled 2011-06-09: qty 2

## 2011-06-09 MED ORDER — ASPIRIN 81 MG PO TABS: 81.0000 mg | ORAL_TABLET | Freq: Every day | ORAL | Status: DC

## 2011-06-09 MED ORDER — RENA-VITE PO TABS
1.0000 | ORAL_TABLET | Freq: Every day | ORAL | Status: DC
  Filled 2011-06-09 (×2): qty 1

## 2011-06-09 MED ORDER — LABETALOL HCL 200 MG PO TABS
200.0000 mg | ORAL_TABLET | Freq: Two times a day (BID) | ORAL | Status: DC
  Administered 2011-06-09 – 2011-06-10 (×2): 200 mg via ORAL
  Filled 2011-06-09 (×3): qty 1

## 2011-06-09 MED ORDER — NA FERRIC GLUC CPLX IN SUCROSE 12.5 MG/ML IV SOLN
125.0000 mg | INTRAVENOUS | Status: DC
  Administered 2011-06-09: 125 mg via INTRAVENOUS
  Filled 2011-06-09: qty 10

## 2011-06-09 MED ORDER — RENA-VITE PO TABS: 1.0000 | ORAL_TABLET | Freq: Every day | ORAL | Status: DC

## 2011-06-09 MED ORDER — PARICALCITOL 5 MCG/ML IV SOLN
INTRAVENOUS | Status: AC
  Administered 2011-06-09: 1 ug via INTRAVENOUS
  Filled 2011-06-09: qty 1

## 2011-06-09 MED ORDER — LIDOCAINE HCL (PF) 1 % IJ SOLN: 5.0000 mL | INTRAMUSCULAR | Status: DC | PRN

## 2011-06-09 MED ORDER — DEXTROSE 50 % IV SOLN
INTRAVENOUS | Status: AC
  Administered 2011-06-09: 50 mL via INTRAVENOUS
  Filled 2011-06-09: qty 50

## 2011-06-09 MED ORDER — HEPARIN SODIUM (PORCINE) 1000 UNIT/ML DIALYSIS: 1000.0000 [IU] | INTRAMUSCULAR | Status: DC | PRN

## 2011-06-09 MED ORDER — OXYCODONE HCL 5 MG PO TABS
5.0000 mg | ORAL_TABLET | ORAL | Status: DC | PRN
  Administered 2011-06-09 – 2011-06-10 (×2): 5 mg via ORAL
  Filled 2011-06-09 (×2): qty 1

## 2011-06-09 MED ORDER — INSULIN ASPART PROT & ASPART (70-30 MIX) 100 UNIT/ML ~~LOC~~ SUSP: 25.0000 [IU] | Freq: Two times a day (BID) | SUBCUTANEOUS | Status: DC

## 2011-06-09 MED ORDER — INSULIN ASPART PROT & ASPART (70-30 MIX) 100 UNIT/ML ~~LOC~~ SUSP
30.0000 [IU] | Freq: Every day | SUBCUTANEOUS | Status: DC
  Filled 2011-06-09: qty 3

## 2011-06-09 MED ORDER — PARICALCITOL 1 MCG PO CAPS: 1.0000 ug | ORAL_CAPSULE | ORAL | Status: DC

## 2011-06-09 MED ORDER — INSULIN ASPART PROT & ASPART (70-30 MIX) 100 UNIT/ML ~~LOC~~ SUSP: 15.0000 [IU] | Freq: Every day | SUBCUTANEOUS | Status: DC

## 2011-06-09 MED ORDER — SODIUM CHLORIDE 0.9 % IV SOLN: 100.0000 mL | INTRAVENOUS | Status: DC | PRN

## 2011-06-09 MED ORDER — LIDOCAINE-PRILOCAINE 2.5-2.5 % EX CREA: 1.0000 "application " | TOPICAL_CREAM | CUTANEOUS | Status: DC | PRN

## 2011-06-09 MED ORDER — ASPIRIN 81 MG PO CHEW
81.0000 mg | CHEWABLE_TABLET | Freq: Every day | ORAL | Status: DC
  Administered 2011-06-10: 81 mg via ORAL
  Filled 2011-06-09: qty 1

## 2011-06-09 MED ORDER — AMLODIPINE BESYLATE 10 MG PO TABS
10.0000 mg | ORAL_TABLET | Freq: Every day | ORAL | Status: DC
  Administered 2011-06-10: 10 mg via ORAL
  Filled 2011-06-09: qty 1

## 2011-06-09 MED ORDER — HEPARIN SODIUM (PORCINE) 1000 UNIT/ML DIALYSIS
100.0000 [IU]/kg | INTRAMUSCULAR | Status: DC | PRN
  Filled 2011-06-09: qty 11

## 2011-06-09 MED ORDER — INSULIN ASPART PROT & ASPART (70-30 MIX) 100 UNIT/ML ~~LOC~~ SUSP
15.0000 [IU] | Freq: Every day | SUBCUTANEOUS | Status: DC
  Administered 2011-06-10: 15 [IU] via SUBCUTANEOUS
  Filled 2011-06-09: qty 3

## 2011-06-09 MED ORDER — INSULIN ASPART PROT & ASPART (70-30 MIX) 100 UNIT/ML ~~LOC~~ SUSP: 25.0000 [IU] | Freq: Every day | SUBCUTANEOUS | Status: DC

## 2011-06-09 MED ORDER — ALTEPLASE 2 MG IJ SOLR
2.0000 mg | Freq: Once | INTRAMUSCULAR | Status: AC | PRN
  Filled 2011-06-09: qty 2

## 2011-06-09 MED ORDER — ASPIRIN 81 MG PO CHEW
324.0000 mg | CHEWABLE_TABLET | Freq: Once | ORAL | Status: AC
  Administered 2011-06-09: 324 mg via ORAL
  Filled 2011-06-09: qty 4

## 2011-06-09 MED ORDER — ASPIRIN EC 81 MG PO TBEC: 81.0000 mg | DELAYED_RELEASE_TABLET | Freq: Every day | ORAL | Status: DC

## 2011-06-09 MED ORDER — HYDRALAZINE HCL 25 MG PO TABS
25.0000 mg | ORAL_TABLET | Freq: Every day | ORAL | Status: DC
  Administered 2011-06-09: 25 mg via ORAL
  Filled 2011-06-09 (×2): qty 1

## 2011-06-09 MED ORDER — HEPARIN SODIUM (PORCINE) 1000 UNIT/ML DIALYSIS
20.0000 [IU]/kg | INTRAMUSCULAR | Status: DC | PRN
  Filled 2011-06-09: qty 2

## 2011-06-09 MED ORDER — LISINOPRIL 20 MG PO TABS
20.0000 mg | ORAL_TABLET | Freq: Every day | ORAL | Status: DC
  Administered 2011-06-09: 20 mg via ORAL
  Filled 2011-06-09 (×2): qty 1

## 2011-06-09 MED ORDER — PARICALCITOL 5 MCG/ML IV SOLN
1.0000 ug | INTRAVENOUS | Status: DC
  Administered 2011-06-09: 1 ug via INTRAVENOUS
  Filled 2011-06-09: qty 0.2

## 2011-06-09 NOTE — ED Notes (Signed)
2013-01 Ready

## 2011-06-09 NOTE — Consult Note (Signed)
Reason for Consult :ESRD Referring Physician: Dr. Verlon Au  36 year old female with a history of ESRD on HD as below, presented today after a bout of hypoglycemia with associated chest discomfort and shortness of breath.  Patient does report a history of intermittent shortness of breath and some DOE over past couple of weeks.  At a recent dialysis session she presented with some lower extremity edema and dyspnea that improved with HD.  Dialyzes at [facility] on MWF . EDW 95.5Kg. Weight today 102.2 kg HD Bath 2k2.25ca, Dialyzer 180, Heparin 10300.Time 4hr AVF Access .  Past Medical History  Diagnosis Date  . Asthma   . Chronic kidney disease     started dialysis 01/29/11  . Weight loss   . Loss of appetite   . CHF (congestive heart failure)   . Hypertension   . Hyperlipidemia   . Diabetes mellitus age 29    uses insulin  . Renal disorder    Past Surgical History  Procedure Date  . Cesarean section   . Cholecystectomy   . Av fistula placement 08/2010    Left radiocephalic AVF  . Ligation goretex fistula 01/04/11    Left AVF   Family History  Problem Relation Age of Onset  . Heart disease Mother     heart attack at 50 y/o-infected valve  . Kidney disease Mother     was a dialysis patient  . Heart disease Brother   . Kidney disease Maternal Grandmother     pre-dialysis  . Kidney failure Paternal Uncle   . Kidney failure Paternal Aunt   . Diabetes Father   . Hypertension Father    Social History:  reports that she has never smoked. She does not have any smokeless tobacco history on file. She reports that she does not drink alcohol or use illicit drugs.  Allergies:  Allergies  Allergen Reactions  . Ciprofloxacin Itching and Nausea And Vomiting    Medications: I have reviewed the patient's current medications. Zemplar 1.42mcg. Epogen 10,300, Venofer 100mg   Results for orders placed during the hospital encounter of 06/09/11 (from the past 48 hour(s))  GLUCOSE, CAPILLARY      Status: Abnormal   Collection Time   06/09/11 12:10 PM      Component Value Range Comment   Glucose-Capillary 45 (*) 70 - 99 (mg/dL)   CBC     Status: Abnormal   Collection Time   06/09/11 12:53 PM      Component Value Range Comment   WBC 11.6 (*) 4.0 - 10.5 (K/uL)    RBC 4.93  3.87 - 5.11 (MIL/uL)    Hemoglobin 12.5  12.0 - 15.0 (g/dL)    HCT 36.8  36.0 - 46.0 (%)    MCV 74.6 (*) 78.0 - 100.0 (fL)    MCH 25.4 (*) 26.0 - 34.0 (pg)    MCHC 34.0  30.0 - 36.0 (g/dL)    RDW 18.4 (*) 11.5 - 15.5 (%)    Platelets 297  150 - 400 (K/uL)   DIFFERENTIAL     Status: Abnormal   Collection Time   06/09/11 12:53 PM      Component Value Range Comment   Neutrophils Relative 70  43 - 77 (%)    Neutro Abs 7.8 (*) 1.7 - 7.7 (K/uL)    Lymphocytes Relative 17  12 - 46 (%)    Lymphs Abs 1.9  0.7 - 4.0 (K/uL)    Monocytes Relative 12  3 - 12 (%)  Monocytes Absolute 1.3 (*) 0.1 - 1.0 (K/uL)    Eosinophils Relative 0  0 - 5 (%)    Eosinophils Absolute 0.0  0.0 - 0.7 (K/uL)    Basophils Relative 0  0 - 1 (%)    Basophils Absolute 0.0  0.0 - 0.1 (K/uL)   CK TOTAL AND CKMB     Status: Normal   Collection Time   06/09/11 12:53 PM      Component Value Range Comment   Total CK 105  7 - 177 (U/L)    CK, MB 1.0  0.3 - 4.0 (ng/mL)    Relative Index 1.0  0.0 - 2.5    COMPREHENSIVE METABOLIC PANEL     Status: Abnormal   Collection Time   06/09/11 12:53 PM      Component Value Range Comment   Sodium 137  135 - 145 (mEq/L)    Potassium 3.9  3.5 - 5.1 (mEq/L)    Chloride 98  96 - 112 (mEq/L)    CO2 26  19 - 32 (mEq/L)    Glucose, Bld 41 (*) 70 - 99 (mg/dL)    BUN 40 (*) 6 - 23 (mg/dL)    Creatinine, Ser 5.53 (*) 0.50 - 1.10 (mg/dL)    Calcium 9.8  8.4 - 10.5 (mg/dL)    Total Protein 7.9  6.0 - 8.3 (g/dL)    Albumin 3.8  3.5 - 5.2 (g/dL)    AST 16  0 - 37 (U/L)    ALT 21  0 - 35 (U/L)    Alkaline Phosphatase 78  39 - 117 (U/L)    Total Bilirubin 0.3  0.3 - 1.2 (mg/dL)    GFR calc non Af Amer 9 (*) >90  (mL/min)    GFR calc Af Amer 11 (*) >90 (mL/min)   TROPONIN I     Status: Normal   Collection Time   06/09/11 12:53 PM      Component Value Range Comment   Troponin I <0.30  <0.30 (ng/mL)   GLUCOSE, CAPILLARY     Status: Normal   Collection Time   06/09/11  1:46 PM      Component Value Range Comment   Glucose-Capillary 84  70 - 99 (mg/dL)   GLUCOSE, CAPILLARY     Status: Normal   Collection Time   06/09/11  2:35 PM      Component Value Range Comment   Glucose-Capillary 80  70 - 99 (mg/dL)   CARDIAC PANEL(CRET KIN+CKTOT+MB+TROPI)     Status: Normal   Collection Time   06/09/11  3:29 PM      Component Value Range Comment   Total CK 85  7 - 177 (U/L)    CK, MB 1.0  0.3 - 4.0 (ng/mL)    Troponin I <0.30  <0.30 (ng/mL)    Relative Index RELATIVE INDEX IS INVALID  0.0 - 2.5     Dg Chest Port 1 View  06/09/2011  *RADIOLOGY REPORT*  Clinical Data: Chest pain  PORTABLE CHEST - 1 VIEW  Comparison: 12/24/2010  Findings: 1310 hours. The lungs are clear without focal infiltrate, edema, pneumothorax or pleural effusion. The cardiopericardial silhouette is within normal limits for size. Imaged bony structures of the thorax are intact. Telemetry leads overlie the chest.  IMPRESSION: Stable.  No acute cardiopulmonary process.  Original Report Authenticated By: ERIC A. MANSELL, M.D.    ROS: as per HPI Blood pressure 136/79, pulse 107, temperature 97.7 F (36.5 C), temperature source Oral, resp.  rate 18, last menstrual period 05/21/2011, SpO2 95.00%. General appearance: alert and cooperative Head: Normocephalic, without obvious abnormality, atraumatic Resp: clear to auscultation bilaterally Chest wall: no tenderness, tender mid sternal chest pain Cardio: regular rate and rhythm, S1, S2 normal, no murmur, click, rub or gallop GI: soft, non-tender; bowel sounds normal; no masses,  no organomegaly Extremities: edema 2 + bilat Skin: Skin color, texture, turgor normal. No rashes or lesions Neurologic:  Grossly normal  Assessment/Plan: 1 ESRD 2 Hypoglycemia 3 Hypertension 4. Anemia of ESRD 5. Metabolic Bone Disease 6. Musculoskeletal Sternal Chest Pain 7. R/O Cardiac CP  Hemodialysis to be done today for volume.  May need repeat treatment in AM.  Ryen Heitmeyer C 06/09/2011, 5:06 PM

## 2011-06-09 NOTE — ED Provider Notes (Signed)
History     CSN: [id number]  Arrival date & time 06/09/11  1205   First MD Initiated Contact with Patient 06/09/11 1232      Chief Complaint  Patient presents with  . Chest Pain    (Consider location/radiation/quality/duration/timing/severity/associated sxs/prior treatment) HPI Patient went for dialysis today and began having nausea in lobby and abdominal cramps.  Patient had bs checked by clinic nurse and it was 19.  Patient states she does not remember until in the back and "they were doing something to me."  Per rn note patient given d50 and bs up to 96.  Patient had chest pain right anterior chest pain.  Pain was stabbing in nature - given nitro x 2 and pain decreased from 10 to 5.  Patient states had before and was seen here in July for this.  Patient on dialysis since October, nephrologist Dr. Justin Mend, pmd Dr. Criss Rosales.   Past Medical History  Diagnosis Date  . Asthma   . Chronic kidney disease   . Weight loss   . Loss of appetite   . CHF (congestive heart failure)   . Hypertension   . Hyperlipidemia   . Diabetes mellitus age 36    uses insulin    Past Surgical History  Procedure Date  . Cesarean section   . Cholecystectomy   . Av fistula placement 08/2010    Left radiocephalic AVF  . Ligation goretex fistula 01/04/11    Left AVF    Family History  Problem Relation Age of Onset  . Heart disease Mother   . Kidney disease Mother   . Heart disease Brother     History  Substance Use Topics  . Smoking status: Never Smoker   . Smokeless tobacco: Not on file  . Alcohol Use: No    OB History    Grav Para Term Preterm Abortions TAB SAB Ect Mult Living                  Review of Systems  All other systems reviewed and are negative.    Allergies  Ciprofloxacin  Home Medications   Current Outpatient Rx  Name Route Sig Dispense Refill  . ALBUTEROL SULFATE HFA 108 (90 BASE) MCG/ACT IN AERS Inhalation Inhale 2 puffs into the lungs every 6 (six) hours as needed.  For shortness of breath    . AMLODIPINE BESYLATE 10 MG PO TABS Oral Take 10 mg by mouth daily.      . ASPIRIN 81 MG PO TABS Oral Take 81 mg by mouth daily.      Marland Kitchen CALCIUM CARBONATE ANTACID 500 MG PO CHEW Oral Chew 1 tablet by mouth 3 (three) times daily with meals.    Marland Kitchen HYDRALAZINE HCL 25 MG PO TABS Oral Take 25 mg by mouth at bedtime.    . INSULIN ASPART PROT & ASPART (70-30) 100 UNIT/ML [place] SUSP Subcutaneous Inject 25-30 Units into the skin 2 (two) times daily with a meal. 30 in the morning and 25 at night.    Marland Kitchen LABETALOL HCL 200 MG PO TABS Oral Take 200 mg by mouth 2 (two) times daily.     Marland Kitchen LINAGLIPTIN 5 MG PO TABS Oral Take 5 mg by mouth daily.    Marland Kitchen LISINOPRIL 20 MG PO TABS Oral Take 20 mg by mouth at bedtime.    Marland Kitchen RENA-VITE PO TABS Oral Take 1 tablet by mouth daily.    Marland Kitchen GAS-X PO Oral Take 1 tablet by mouth daily as needed.  For gas    . SIMVASTATIN 5 MG PO TABS Oral Take 5 mg by mouth at bedtime.      BP 143/91  Pulse 99  Temp(Src) 97.7 F (36.5 C) (Oral)  Resp 18  SpO2 100%  LMP 05/21/2011  Physical Exam  Nursing note and vitals reviewed. Constitutional: She is oriented to person, place, and time. She appears well-developed and well-nourished.  HENT:  Head: Normocephalic and atraumatic.  Right Ear: External ear normal.  Left Ear: External ear normal.  Nose: Nose normal.  Mouth/Throat: Oropharynx is clear and moist.  Eyes: Conjunctivae and EOM are normal. Pupils are equal, round, and reactive to light.  Neck: Normal range of motion. Neck supple.  Cardiovascular: Normal rate, regular rhythm and normal heart sounds.   Pulmonary/Chest: Effort normal and breath sounds normal.  Abdominal: Soft. Bowel sounds are normal.  Musculoskeletal:       Left forearm av fistula  Neurological: She is alert and oriented to person, place, and time. She has normal reflexes.  Skin: Skin is warm.  Psychiatric: She has a normal mood and affect. Her behavior is normal. Thought content normal.     ED Course  Procedures (including critical care time)  Labs Reviewed  GLUCOSE, CAPILLARY - Abnormal; Notable for the following:    Glucose-Capillary 45 (*)    All other components within normal limits   No results found.   No diagnosis found.    MDM   Date: 06/09/2011  Rate: 102  Rhythm: sinus tachycardia  QRS Axis: normal  Intervals: normal  ST/T Wave abnormalities: normal  Conduction Disutrbances:none  Narrative Interpretation:   Old EKG Reviewed: none available  Patient's care discussed with Dr. Alinda Dooms. Patient's blood  Sugar has remained within normal range here. Patient is taking by mouth without difficulty. We are rechecking another blood sugar now.  Patient's chest pain has completely resolved. She had chest pain started after her hypoglycemic event. She was given nitroglycerin at dialysis s Center and her chest pain had mostly resolved when she arrived here and has now completely resolved. Her EKG did not show any acutely ischemic changes. She does have diabetes, hypertension, and renal failure as well as family history.        Shaune Pollack, MD 06/09/11 1435

## 2011-06-09 NOTE — Progress Notes (Signed)
Call to [phone] to request Ferric gluconate. Two messages sent via [facility] communication note prior to this call. Pharmacist states he will send medicine.

## 2011-06-09 NOTE — ED Notes (Signed)
Pt was at dialysis CBG dropped to 36, pt given D50 CBG 96 pt c/o midsternal CP. Pt also reports shortness of breath and nausea. Pt has Left FA fistula still accessed from dialysis.

## 2011-06-09 NOTE — Progress Notes (Signed)
Hemodialysis Note: Routine treament initiated after admission for dialysis. Has significant evidence for volume overload and therefore adjustments in treatment communicated with RN Jakeem Grape C

## 2011-06-09 NOTE — ED Notes (Signed)
Pt transported stable and in no acute distress on monitor with tech.

## 2011-06-09 NOTE — ED Notes (Signed)
Received report. Pt resting quietly on stretcher awaiting bed placement. Pt denies any pain. Reports mild shortness of breath. Pt noted with equal rise and fall of chest, unlabored, breath sounds clear, O2 sats 98% on room air. NSR noted no monitor. Alert and oriented. IV site is SL and benign. Skin is warm and dry. Pt denies needs at this time. SR up. Call bell in reach.

## 2011-06-09 NOTE — H&P (Signed)
PCP:   Elyn Peers, MD, MD   Subspecialists involved : Nephrology  Chief Complaint:  Low Blood sugar and CP  Was sitting waitiing on dialysis at facility and felt nauseated and then vomited.  Clinic nurse saw her and it was noted at the time that her FSBS was low and then she passed out again and next remembers being in the ambulance on her way over here.  States that when she came in she had some chest pain and some tightness in the chest, and occasionally when her sugar drops when she is at home she has similar sensations as well.   Pain is described as a stabbing pain--It seems to have gone down her Left arm as well.  Was coming-going type of pain.  When she was at the center it was 10/10-nothing really releived it, but "some medicine under her tongue" seemed to help at the center-she received 2 of such tablets  feels at times like her breath is also getting short.  Feels that way now predominantly, more so than having significant CP. Was given 325 mg of ASA in ED, CXR done unremarkeable  Review of Systems:  No blurred vision no double vision now (but was this was earlier today)  Has a bad headache which started after nitro being given.  No cough/cold/fever. Vomtied x1 at [employer] center.  Took breakfast this am.  No diarrhoea.  No dysuria. No slurred speech, no weakness on any one side of the body.  Past Medical History: Past Medical History  Diagnosis Date  . Asthma   . Chronic kidney disease     started dialysis 01/29/11  . Weight loss   . Loss of appetite   . CHF (congestive heart failure)   . Hypertension   . Hyperlipidemia   . Diabetes mellitus age 10    uses insulin    Past surgical history: Past Surgical History  Procedure Date  . Cesarean section   . Cholecystectomy   . Av fistula placement 08/2010    Left radiocephalic AVF  . Ligation goretex fistula 01/04/11    Left AVF    Medications: Prior to Admission medications   Medication Sig Start Date End Date Taking?  Authorizing Provider  albuterol (PROVENTIL HFA;VENTOLIN HFA) 108 (90 BASE) MCG/ACT inhaler Inhale 2 puffs into the lungs every 6 (six) hours as needed. For shortness of breath   Yes Historical Provider, MD  amLODipine (NORVASC) 10 MG tablet Take 10 mg by mouth daily.     Yes Historical Provider, MD  aspirin 81 MG tablet Take 81 mg by mouth daily.     Yes Historical Provider, MD  calcium carbonate (TUMS - DOSED IN MG ELEMENTAL CALCIUM) 500 MG chewable tablet Chew 1 tablet by mouth 3 (three) times daily with meals.   Yes Historical Provider, MD  hydrALAZINE (APRESOLINE) 25 MG tablet Take 25 mg by mouth at bedtime.   Yes Historical Provider, MD  insulin aspart protamine-insulin aspart (NOVOLOG 70/30) (70-30) 100 UNIT/ML injection Inject 25-30 Units into the skin 2 (two) times daily with a meal. 30 in the morning and 25 at night.   Yes Historical Provider, MD  labetalol (NORMODYNE) 200 MG tablet Take 200 mg by mouth 2 (two) times daily.    Yes Historical Provider, MD  linagliptin (TRADJENTA) 5 MG TABS tablet Take 5 mg by mouth daily.   Yes Historical Provider, MD  lisinopril (PRINIVIL,ZESTRIL) 20 MG tablet Take 20 mg by mouth at bedtime.   Yes Historical Provider,  MD  multivitamin (RENA-VIT) TABS tablet Take 1 tablet by mouth daily.   Yes Historical Provider, MD  Simethicone (GAS-X PO) Take 1 tablet by mouth daily as needed. For gas   Yes Historical Provider, MD  simvastatin (ZOCOR) 5 MG tablet Take 5 mg by mouth at bedtime.   Yes Historical Provider, MD    Allergies:   Allergies  Allergen Reactions  . Ciprofloxacin Itching and Nausea And Vomiting    Social History:  reports that she has never smoked. She does not have any smokeless tobacco history on file. She reports that she does not drink alcohol or use illicit drugs.  Family History: Family History  Problem Relation Age of Onset  . Heart disease Mother     heart attack at 14 y/o-infected valve  . Kidney disease Mother     was a  dialysis patient  . Heart disease Brother   . Kidney disease Maternal Grandmother     pre-dialysis  . Kidney failure Paternal Uncle   . Kidney failure Paternal Aunt   . Diabetes Father   . Hypertension Father     Physical Exam: Filed Vitals:   06/09/11 1207 06/09/11 1300  BP: 143/91 149/91  Pulse: 99 99  Temp: 97.7 F (36.5 C)   TempSrc: Oral   Resp: 18 20  SpO2: 100% 100%    HEENT-Flat affect, MOrbidly obese AAF, in No apparent distress CARDS-s1 s2 no m/r/g.  No bruit, no JVD SKIN-Fisula(e) to L/R arm GU-Deferred GI-Abd soft nt/ND  Labs on Admission:   [facility] 06/09/11 1253  NA 137  K 3.9  CL 98  CO2 26  GLUCOSE 41*  BUN 40*  CREATININE 5.53*  CALCIUM 9.8  MG --  PHOS --    Basename 06/09/11 1253  AST 16  ALT 21  ALKPHOS 78  BILITOT 0.3  PROT 7.9  ALBUMIN 3.8   No results found for this basename: LIPASE:2,AMYLASE:2 in the last 72 hours  Basename 06/09/11 1253  WBC 11.6*  NEUTROABS 7.8*  HGB 12.5  HCT 36.8  MCV 74.6*  PLT 297    Basename 06/09/11 1253  CKTOTAL 105  CKMB 1.0  CKMBINDEX --  TROPONINI <0.30   No results found for this basename: TSH,T4TOTAL,FREET3,T3FREE,THYROIDAB in the last 72 hours No results found for this basename: VITAMINB12:2,FOLATE:2,FERRITIN:2,TIBC:2,IRON:2,RETICCTPCT:2 in the last 72 hours  Radiological Exams on Admission: Dg Chest Port 1 View  06/09/2011  *RADIOLOGY REPORT*  Clinical Data: Chest pain  PORTABLE CHEST - 1 VIEW  Comparison: 12/24/2010  Findings: 1310 hours. The lungs are clear without focal infiltrate, edema, pneumothorax or pleural effusion. The cardiopericardial silhouette is within normal limits for size. Imaged bony structures of the thorax are intact. Telemetry leads overlie the chest.  IMPRESSION: Stable.  No acute cardiopulmonary process.  Original Report Authenticated By: ERIC A. MANSELL, M.D.    Assessment/Plan Patient Active Problem List  Diagnoses  . HYPERLIPIDEMIA-Continue Zocor 5 mg as an  out-patient  . OBESITY, UNSPECIFIED  . Unspecified asthma-stable  . GERD  . END stage CKD on Dialysis M/W/F-Will contact Nephrology to arrange Dialysis  . DEPRESSION, HX OF-  . DIABETES MELLITUS, TYPE I0-Developed significant Hypoglycemia this am which is what prompted initial evaluation-Will cut back her Novolog 70/30 to 15 units- Hold Tradjenta while in Hospital  . HYPERTENSION-continue Labetalol 200 bid, Amlodipine 10 daily, Lisinopril 20 mg, Hydralazine 25 qhs  . Chest pain-Will cycle Cardia enzymes x 3 and rpt Am EKG.  She has some intermediate probability risk factors  for Cardiogenic CP and would likely benefit from [address]-patient risk stratification when she sees her PCP, although I doubt this is an actual coronary event Her First EKg today hows NSR with some tachycardia, PR interval = 0.08, Axiz 0 degrees, Possible RAE with no Acute ST-T wave changes from EKG done 12/24/10   Admitted to Team #5, OBS CP rule out  [facility] 06/09/2011, 2:41 PM

## 2011-06-10 ENCOUNTER — Other Ambulatory Visit: Payer: Self-pay

## 2011-06-10 LAB — CARDIAC PANEL(CRET KIN+CKTOT+MB+TROPI)
CK, MB: 0.9 ng/mL (ref 0.3–4.0)
Relative Index: INVALID (ref 0.0–2.5)
Relative Index: INVALID (ref 0.0–2.5)
Total CK: 89 U/L (ref 7–177)
Total CK: 72 U/L (ref 7–177)
Troponin I: <0.3 ng/mL (ref <0.30)

## 2011-06-10 LAB — GLUCOSE, CAPILLARY
Glucose-Capillary: 154 mg/dL — ABNORMAL HIGH (ref 70–99)
Glucose-Capillary: 115 mg/dL — ABNORMAL HIGH (ref 70–99)

## 2011-06-10 MED ORDER — INSULIN ASPART PROT & ASPART (70-30 MIX) 100 UNIT/ML ~~LOC~~ SUSP: 15.0000 [IU] | Freq: Every day | SUBCUTANEOUS | Status: DC

## 2011-06-10 MED ORDER — INSULIN ASPART PROT & ASPART (70-30 MIX) 100 UNIT/ML ~~LOC~~ SUSP: 5.0000 [IU] | Freq: Every day | SUBCUTANEOUS | Status: DC

## 2011-06-10 NOTE — Progress Notes (Signed)
06/10/2011 [place] Case Management Note [phone]  Utilization review completed.

## 2011-06-10 NOTE — Progress Notes (Signed)
Assessment/Plan:  1 ESRD  2 Hypoglycemia  3 Hypertension  4. Anemia of ESRD  5. Metabolic Bone Disease  6. Musculoskeletal Sternal Chest Pain  7. R/O Cardiac CP  To f/u with HD at [facility].  Lower EDW 1Kg S:feels betteer O:BP 101/71  Pulse 94  Temp(Src) 97.2 F (36.2 C) (Oral)  Resp 18  Ht 5\' 6"  (1.676 m)  Wt 93.8 kg (206 lb 12.7 oz)  BMI 33.38 kg/m2  SpO2 95%  LMP 05/21/2011  Intake/Output Summary (Last 24 hours) at 06/10/11 0919 Last data filed at 06/09/11 2200  Gross per 24 hour  Intake      0 ml  Output   4500 ml  Net  -4500 ml   Weight change:  Gen:waf CVS:RRR, mild sternal tenderness Resp:clear [id number] tender RUQ [id number] edema     . amLODipine  10 mg Oral Daily  . aspirin  324 mg Oral Once  . aspirin  81 mg Oral Daily  . calcium carbonate  1 tablet Oral TID WC  . dextrose  1 ampule Intravenous Once  . ferric gluconate (FERRLECIT/NULECIT) IV  125 mg Intravenous Q M,W,F-HD  . ferric gluconate (FERRLECIT/NULECIT) Test Dose  25 mg Intravenous Once  . hydrALAZINE  25 mg Oral QHS  . insulin aspart protamine-insulin aspart  15 Units Subcutaneous Q supper  . insulin aspart protamine-insulin aspart  15 Units Subcutaneous Q breakfast  . labetalol  200 mg Oral BID  . lisinopril  20 mg Oral QHS  . multivitamin  1 tablet Oral Q2000  . paricalcitol  1 mcg Intravenous Q M,W,F-HD  . simvastatin  5 mg Oral QHS  . DISCONTD: aspirin EC  81 mg Oral Daily  . DISCONTD: aspirin  81 mg Oral Daily  . DISCONTD: insulin aspart protamine-insulin aspart  25 Units Subcutaneous Q supper  . DISCONTD: insulin aspart protamine-insulin aspart  25-30 Units Subcutaneous BID WC  . DISCONTD: insulin aspart protamine-insulin aspart  30 Units Subcutaneous Q breakfast  . DISCONTD: multivitamin  1 tablet Oral Daily  . DISCONTD: paricalcitol  1 mcg Oral 3 times weekly   Dg Chest Port 1 View  06/09/2011  *RADIOLOGY REPORT*  Clinical Data: Chest pain  PORTABLE CHEST - 1 VIEW  Comparison:  12/24/2010  Findings: 1310 hours. The lungs are clear without focal infiltrate, edema, pneumothorax or pleural effusion. The cardiopericardial silhouette is within normal limits for size. Imaged bony structures of the thorax are intact. Telemetry leads overlie the chest.  IMPRESSION: Stable.  No acute cardiopulmonary process.  Original Report Authenticated By: ERIC A. MANSELL, M.D.   BMET  Lab 06/09/11 1253  NA 137  K 3.9  CL 98  CO2 26  GLUCOSE 41*  BUN 40*  CREATININE 5.53*  ALB --  CALCIUM 9.8  PHOS --   CBC  Lab 06/09/11 1253  WBC 11.6*  NEUTROABS 7.8*  HGB 12.5  HCT 36.8  MCV 74.6*  PLT 297    Izzy Courville C

## 2011-06-10 NOTE — Discharge Summary (Signed)
[facility] Date of Admission: 06/09/2011 12:05 PM Admitter: @ADMITPROV @   Date of Discharge3/10/2011 Attending Physician: Verneita Griffes, MD  Recommendations on discharge Consider exercise stress test, although likely will be low yield Recommend checking blood sugars and keeping off of [facility] Discharge Summary  36 year old [place] American female known history of asthma, chronic kidney disease (started since 01/2011) loss of appetite congestive heart failure history, hypertension, hyperlipidemia, diabetes mellitus type 1 since age 64 presented to her dialysis center and felt woozy weak and developed hypoglycemia associated with possible percent. At the same time she also experienced some chest pain and could not recall much except for resuscitation that was done in the field with dextrose and or so oral treatment for the same. She had initial workup nonsuggestive of cardiogenic chest pain but given her risk factors including her mother passing at age 7 from a heart attack also her being on dialysis and having hypertension and history of heart failure and was admitted for chest pain workup and rule out hospital  Hospital Course by problem list: Assessment/Plan  Patient Active Problem List   Diagnoses   .  Chest pain-Cardiac enzymes x 3 were negative and rpt Am EKG.  First EKg today hows NSR with some tachycardia, PR interval = 0.08, Axiz 0 degrees, Possible RAE with no Acute ST-T wave changes from EKG done 12/24/10 Her EKG findings on repeat EKG 06/10/2011 did not show any significant change other than some mild upsloping of the ST segment.  I do not think that she likely will need objective exercise stress testing or other workup and I believe that all of her symptoms were related to her being hypoglycemic and having some impending anxiety and adrenergic response to this.   Marland Kitchen  DIABETES MELLITUS, TYPE I0-Developed significant Hypoglycemia morning of admission. She states that she had  seen her primary care physician about one week ago and was back to 70/30 insulin 5 units at night given the fact that she was dropping very low on her blood sugars while at home. I believe that should be cut back further toNovolog 70/30 to 15 units in the morning and 5 units in the evening. Would recommend holding off of using transient for the short term time until seen by primary care physician    .  Unspecified asthma-stable   .  GERD-stable   .  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 As such she was slightly hypotensive and had a headache from this. This seemed to be unremarkable subsequent to my visit with her this morning   .  DEPRESSION, HX OF- stable at present   .  HYPERTENSION-continue Labetalol 200 bid, Amlodipine 10 daily, Lisinopril 20 mg, Hydralazine 25 qhs   .   HYPERLIPIDEMIA-Continue Zocor 5 mg as an out-patient       Procedures Performed and pertinent labs: Dg Chest Port 1 View  06/09/2011  *RADIOLOGY REPORT*  Clinical Data: Chest pain  PORTABLE CHEST - 1 VIEW  Comparison: 12/24/2010  Findings: 1310 hours. The lungs are clear without focal infiltrate, edema, pneumothorax or pleural effusion. The cardiopericardial silhouette is within normal limits for size. Imaged bony structures of the thorax are intact. Telemetry leads overlie the chest.  IMPRESSION: Stable.  No acute cardiopulmonary process.  Original Report Authenticated By: ERIC A. MANSELL, M.D.    Discharge Vitals & PE:  BP 101/71  Pulse 94  Temp(Src) 97.2 F (36.2 C) (Oral)  Resp 18  Ht 5\' 6"  (1.676 m)  Wt 93.8 kg (206 lb 12.7 oz)  BMI 33.38 kg/m2  SpO2 95%  LMP 05/21/2011  doing fair this morning has slight headache, no specific chest pain no shortness of breath no blurred vision no double vision Neck soft no JVD no pallor or icterus Chest clinically clear no added sound Abdomen soft nontender  nondistended Neurologically able to move all 4 limbs equally.  Discharge Labs:  Results for orders placed during the hospital encounter of 06/09/11 (from the past 24 hour(s))  GLUCOSE, CAPILLARY     Status: Abnormal   Collection Time   06/09/11 12:10 PM      Component Value Range   Glucose-Capillary 45 (*) 70 - 99 (mg/dL)  CBC     Status: Abnormal   Collection Time   06/09/11 12:53 PM      Component Value Range   WBC 11.6 (*) 4.0 - 10.5 (K/uL)   RBC 4.93  3.87 - 5.11 (MIL/uL)   Hemoglobin 12.5  12.0 - 15.0 (g/dL)   HCT 36.8  36.0 - 46.0 (%)   MCV 74.6 (*) 78.0 - 100.0 (fL)   MCH 25.4 (*) 26.0 - 34.0 (pg)   MCHC 34.0  30.0 - 36.0 (g/dL)   RDW 18.4 (*) 11.5 - 15.5 (%)   Platelets 297  150 - 400 (K/uL)  DIFFERENTIAL     Status: Abnormal   Collection Time   06/09/11 12:53 PM      Component Value Range   Neutrophils Relative 70  43 - 77 (%)   Neutro Abs 7.8 (*) 1.7 - 7.7 (K/uL)   Lymphocytes Relative 17  12 - 46 (%)   Lymphs Abs 1.9  0.7 - 4.0 (K/uL)   Monocytes Relative 12  3 - 12 (%)   Monocytes Absolute 1.3 (*) 0.1 - 1.0 (K/uL)   Eosinophils Relative 0  0 - 5 (%)   Eosinophils Absolute 0.0  0.0 - 0.7 (K/uL)   Basophils Relative 0  0 - 1 (%)   Basophils Absolute 0.0  0.0 - 0.1 (K/uL)  CK TOTAL AND CKMB     Status: Normal   Collection Time   06/09/11 12:53 PM      Component Value Range   Total CK 105  7 - 177 (U/L)   CK, MB 1.0  0.3 - 4.0 (ng/mL)   Relative Index 1.0  0.0 - 2.5   COMPREHENSIVE METABOLIC PANEL     Status: Abnormal   Collection Time   06/09/11 12:53 PM      Component Value Range   Sodium 137  135 - 145 (mEq/L)   Potassium 3.9  3.5 - 5.1 (mEq/L)   Chloride 98  96 - 112 (mEq/L)   CO2 26  19 - 32 (mEq/L)   Glucose, Bld 41 (*) 70 - 99 (mg/dL)   BUN 40 (*) 6 - 23 (mg/dL)   Creatinine, Ser 5.53 (*) 0.50 - 1.10 (mg/dL)   Calcium 9.8  8.4 - 10.5 (mg/dL)   Total Protein 7.9  6.0 - 8.3 (g/dL)   Albumin 3.8  3.5 - 5.2 (g/dL)   AST 16  0 - 37 (U/L)   ALT 21  0 - 35  (U/L)   Alkaline Phosphatase 78  39 - 117 (U/L)   Total Bilirubin 0.3  0.3 - 1.2 (mg/dL)   GFR calc non Af Amer 9 (*) >90 (mL/min)   GFR calc Af  Amer 11 (*) >90 (mL/min)  TROPONIN I     Status: Normal   Collection Time   06/09/11 12:53 PM      Component Value Range   Troponin I <0.30  <0.30 (ng/mL)  GLUCOSE, CAPILLARY     Status: Normal   Collection Time   06/09/11  1:46 PM      Component Value Range   Glucose-Capillary 84  70 - 99 (mg/dL)  GLUCOSE, CAPILLARY     Status: Normal   Collection Time   06/09/11  2:35 PM      Component Value Range   Glucose-Capillary 80  70 - 99 (mg/dL)  CARDIAC PANEL(CRET KIN+CKTOT+MB+TROPI)     Status: Normal   Collection Time   06/09/11  3:29 PM      Component Value Range   Total CK 85  7 - 177 (U/L)   CK, MB 1.0  0.3 - 4.0 (ng/mL)   Troponin I <0.30  <0.30 (ng/mL)   Relative Index RELATIVE INDEX IS INVALID  0.0 - 2.5   GLUCOSE, CAPILLARY     Status: Abnormal   Collection Time   06/09/11  5:36 PM      Component Value Range   Glucose-Capillary 222 (*) 70 - 99 (mg/dL)  CARDIAC PANEL(CRET KIN+CKTOT+MB+TROPI)     Status: Normal   Collection Time   06/09/11 11:15 PM      Component Value Range   Total CK 89  7 - 177 (U/L)   CK, MB 0.9  0.3 - 4.0 (ng/mL)   Troponin I <0.30  <0.30 (ng/mL)   Relative Index RELATIVE INDEX IS INVALID  0.0 - 2.5   GLUCOSE, CAPILLARY     Status: Normal   Collection Time   06/09/11 11:38 PM      Component Value Range   Glucose-Capillary 86  70 - 99 (mg/dL)  GLUCOSE, CAPILLARY     Status: Abnormal   Collection Time   06/10/11  5:32 AM      Component Value Range   Glucose-Capillary 154 (*) 70 - 99 (mg/dL)    Disposition and follow-up:   Ms.Shenaya D Nong was discharged from in Good condition.    Follow-up Appointments: Discharge Orders    Future Appointments: Provider: Department: Dept Phone: Center:   06/22/2011 3:00 PM Pearson Forster [facility] [id number]   06/24/2011 10:30 AM Delice Lesch, MD [facility]  [phone] None     Future Orders Please Complete By Expires   Diet - low sodium heart healthy      Diet Carb Modified      Increase activity slowly      Call MD for:  persistant nausea and vomiting      Call MD for:  severe uncontrolled pain      Call MD for:  hives      Call MD for:  difficulty breathing, headache or visual disturbances      Call MD for:  extreme fatigue         Discharge Medications: Medication List  As of 06/10/2011  8:21 AM   STOP taking these medications         linagliptin 5 MG Tabs tablet         TAKE these medications         albuterol 108 (90 BASE) MCG/ACT inhaler   Commonly known as: PROVENTIL HFA;VENTOLIN HFA   Inhale 2 puffs into the lungs every 6 (six) hours as needed. For shortness of breath  amLODipine 10 MG tablet   Commonly known as: NORVASC   Take 10 mg by mouth daily.      aspirin 81 MG tablet   Take 81 mg by mouth daily.      calcium carbonate 500 MG chewable tablet   Commonly known as: TUMS - dosed in mg elemental calcium   Chew 1 tablet by mouth 3 (three) times daily with meals.      GAS-X PO   Take 1 tablet by mouth daily as needed. For gas      hydrALAZINE 25 MG tablet   Commonly known as: APRESOLINE   Take 25 mg by mouth at bedtime.      insulin aspart protamine-insulin aspart (70-30) 100 UNIT/ML injection   Commonly known as: NOVOLOG 70/30   Inject 15 Units into the skin daily with breakfast. 30 in the morning and 25 at night.      insulin aspart protamine-insulin aspart (70-30) 100 UNIT/ML injection   Commonly known as: NOVOLOG 70/30   Inject 5 Units into the skin daily with supper.      labetalol 200 MG tablet   Commonly known as: NORMODYNE   Take 200 mg by mouth 2 (two) times daily.      lisinopril 20 MG tablet   Commonly known as: PRINIVIL,ZESTRIL   Take 20 mg by mouth at bedtime.      multivitamin Tabs tablet   Take 1 tablet by mouth daily.      simvastatin 5 MG tablet   Commonly known as: ZOCOR    Take 5 mg by mouth at bedtime.           Medications Discontinued During This Encounter  Medication Reason  . Simethicone (GAS-X PO) Entry Error  . Fluticasone Propionate, Inhal, (FLOVENT IN) Patient has not taken in last 30 days  . calcium carbonate (TUMS - DOSED IN MG ELEMENTAL CALCIUM) 500 MG chewable tablet Patient has not taken in last 30 days  . Calcium Carbonate-Vitamin D (CALCIUM + D PO) Patient has not taken in last 30 days  . ergocalciferol (VITAMIN D2) 50000 UNITS capsule Patient has not taken in last 30 days  . cloNIDine (CATAPRES) 0.1 MG tablet Patient has not taken in last 30 days  . paricalcitol (ZEMPLAR) capsule 1 mcg Change in therapy  . aspirin tablet 81 mg   . insulin aspart protamine-insulin aspart (NOVOLOG 70/30) injection 25-30 Units   . multivitamin (RENA-VIT) tablet 1 tablet   . insulin aspart protamine-insulin aspart (NOVOLOG 70/30) injection 25 Units   . insulin aspart protamine-insulin aspart (NOVOLOG 70/30) injection 30 Units   . aspirin EC tablet 81 mg Duplicate  . linagliptin (TRADJENTA) 5 MG TABS tablet Stop Taking at Discharge  . insulin aspart protamine-insulin aspart (NOVOLOG 70/30) (70-30) 100 UNIT/ML injection     Signed: Tish Begin,JAI 06/10/2011, 8:21 AM

## 2011-06-22 ENCOUNTER — Ambulatory Visit: Payer: Medicare Other | Attending: Family Medicine

## 2011-06-22 DIAGNOSIS — R293 Abnormal posture: Secondary | ICD-10-CM | POA: Insufficient documentation

## 2011-06-22 DIAGNOSIS — M545 Low back pain, unspecified: Secondary | ICD-10-CM | POA: Insufficient documentation

## 2011-06-22 DIAGNOSIS — IMO0001 Reserved for inherently not codable concepts without codable children: Secondary | ICD-10-CM | POA: Insufficient documentation

## 2011-06-24 ENCOUNTER — Ambulatory Visit (INDEPENDENT_AMBULATORY_CARE_PROVIDER_SITE_OTHER): Payer: Medicare Other | Admitting: Obstetrics and Gynecology

## 2011-06-24 DIAGNOSIS — Z124 Encounter for screening for malignant neoplasm of cervix: Secondary | ICD-10-CM

## 2011-06-29 ENCOUNTER — Encounter: Payer: Medicare Other | Admitting: Physical Therapy

## 2011-07-01 ENCOUNTER — Encounter: Payer: Medicare Other | Admitting: Physical Therapy

## 2011-07-06 ENCOUNTER — Ambulatory Visit: Payer: Medicare Other | Attending: Family Medicine

## 2011-07-06 DIAGNOSIS — M545 Low back pain, unspecified: Secondary | ICD-10-CM | POA: Insufficient documentation

## 2011-07-06 DIAGNOSIS — IMO0001 Reserved for inherently not codable concepts without codable children: Secondary | ICD-10-CM | POA: Insufficient documentation

## 2011-07-06 DIAGNOSIS — R293 Abnormal posture: Secondary | ICD-10-CM | POA: Insufficient documentation

## 2011-07-13 ENCOUNTER — Ambulatory Visit: Payer: Medicare Other

## 2011-08-03 ENCOUNTER — Ambulatory Visit: Payer: Medicare Other | Admitting: Endocrinology

## 2011-08-03 DIAGNOSIS — Z0289 Encounter for other administrative examinations: Secondary | ICD-10-CM

## 2011-08-10 ENCOUNTER — Ambulatory Visit (INDEPENDENT_AMBULATORY_CARE_PROVIDER_SITE_OTHER): Payer: Medicare Other | Admitting: Endocrinology

## 2011-08-10 ENCOUNTER — Encounter: Payer: Self-pay | Admitting: Endocrinology

## 2011-08-10 VITALS — BP 132/82 | HR 98 | Temp 98.2°F | Ht 66.0 in | Wt 208.0 lb

## 2011-08-10 DIAGNOSIS — E1029 Type 1 diabetes mellitus with other diabetic kidney complication: Secondary | ICD-10-CM

## 2011-08-10 DIAGNOSIS — N058 Unspecified nephritic syndrome with other morphologic changes: Secondary | ICD-10-CM

## 2011-08-10 NOTE — Progress Notes (Signed)
Subjective:    Patient ID: Linda Ellison, female    DOB: 19-Nov-1975, 36 y.o.   MRN: [id number]  HPI i last saw this pt 2 years ago.  pt has 18 years h/o dm, complicated by renal failure and peripheral sensory neuropathy.  she has been on insulin since dx.  pt says her diet and exercise are good.  She has hypoglycemia almost daily.  She was recently hospitalized with severe hypoglycemia.  After that, insulin was reduced to its current 15 units qam, but she still has frequent hypoglycemia. She has few years of slight numbness of the feet,, but no assoc pain.  Past Medical History  Diagnosis Date  . Asthma   . Chronic kidney disease     started dialysis 01/29/11  . Weight loss   . Loss of appetite   . CHF (congestive heart failure)   . Hypertension   . Hyperlipidemia   . Diabetes mellitus age 80    uses insulin  . Renal disorder     Past Surgical History  Procedure Date  . Cesarean section   . Cholecystectomy   . Av fistula placement 08/2010    Left radiocephalic AVF  . Ligation goretex fistula 01/04/11    Left AVF    History   Social History  . Marital Status: Single    Spouse Name: N/A    Number of Children: N/A  . Years of Education: N/A   Occupational History  . Not on file.   Social History Main Topics  . Smoking status: Never Smoker   . Smokeless tobacco: Not on file  . Alcohol Use: No  . Drug Use: No  . Sexually Active:    Other Topics Concern  . 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 albuterol (PROVENTIL HFA;VENTOLIN HFA) 108 (90 BASE) MCG/ACT inhaler Inhale 2 puffs into the lungs every 6 (six) hours as needed. For shortness of breath      . amLODipine (NORVASC) 10 MG tablet Take 10 mg by mouth daily.        Marland Kitchen aspirin 81 MG tablet Take 81 mg by mouth daily.         . calcium carbonate (TUMS - DOSED IN MG ELEMENTAL CALCIUM) 500 MG chewable tablet Chew 1 tablet by mouth 3 (three) times daily with meals.      . hydrALAZINE (APRESOLINE) 25 MG tablet Take 25 mg by mouth at bedtime.      . insulin aspart protamine-insulin aspart (NOVOLOG 70/30) (70-30) 100 UNIT/ML injection Inject 15 Units into the skin daily with breakfast. 30 in the morning and 25 at night.  10 mL  0  . insulin aspart protamine-insulin aspart (NOVOLOG 70/30) (70-30) 100 UNIT/ML injection Inject 5 Units into the skin daily with supper.  10 mL  0  . labetalol (NORMODYNE) 200 MG tablet Take 200 mg by mouth 2 (two) times daily.       Marland Kitchen lisinopril (PRINIVIL,ZESTRIL) 20 MG tablet Take 20 mg by mouth at bedtime.      . multivitamin (RENA-VIT) TABS tablet Take 1 tablet by mouth daily.      . Simethicone (GAS-X PO) Take 1 tablet by mouth daily as needed. For gas      . simvastatin (ZOCOR) 5 MG tablet Take 5  mg by mouth at bedtime.      Marland Kitchen DISCONTD: linagliptin (TRADJENTA) 5 MG TABS tablet Take 5 mg by mouth daily.        Allergies  Allergen Reactions  . Ciprofloxacin Itching and Nausea And Vomiting    Family History  Problem Relation Age of Onset  . Heart disease Mother     heart attack at 43 y/o-infected valve  . Kidney disease Mother     was a dialysis patient  . Heart disease Brother   . Kidney disease Maternal Grandmother     pre-dialysis  . Kidney failure Paternal Uncle   . Kidney failure Paternal Aunt   . Diabetes Father   . Hypertension Father    There were no vitals taken for this visit.  Review of Systems denies blurry vision, headache, chest pain, sob, n/v, urinary frequency, excessive diaphoresis, memory loss, depression, and rhinorrhea.  She has lost weight.  She has muscle cramps and easy bruising.  She has irregular menses.    Objective:   Physical Exam VS: see vs page GEN: no distress HEAD: head: no deformity eyes: no periorbital swelling, no proptosis external  nose and ears are normal mouth: no lesion seen NECK: supple, thyroid is not enlarged CHEST WALL: no deformity LUNGS:  Clear to auscultation CV: reg rate and rhythm, no murmur ABD: abdomen is soft, nontender.  no hepatosplenomegaly.  not distended.  no hernia MUSCULOSKELETAL: muscle bulk and strength are grossly normal.  no obvious joint swelling.  gait is normal and steady EXTEMITIES: no deformity.  no ulcer on the feet.  feet are of normal color and temp.  no edema PULSES: dorsalis pedis intact bilat.  no carotid bruit NEURO:  cn 2-12 grossly intact.   readily moves all 4's.  sensation is intact to touch on the feet, but decreased from normal SKIN:  Normal texture and temperature.  No rash or suspicious lesion is visible.   NODES:  None palpable at the neck PSYCH: alert, oriented x3.  Does not appear anxious nor depressed.  Lab Results  Component Value Date   HGBA1C 7.2* 06/10/2011  (pt says her a1c was 5.8 yesterday)    Assessment & Plan:  DM, overcontrolled.  This causes high risk to her health.  She may be able to go off insulin. ESRD.  This increases the risk of hypoglycemia Weight loss.  This also increases the risk of hypoglycemia. Obesity.  This complicates the rx of DM.

## 2011-08-10 NOTE — Patient Instructions (Addendum)
good diet and exercise habits significanly improve the control of your diabetes.  please let me know if you wish to be referred to a dietician.  high blood sugar is very risky to your health.  you should see an eye doctor every year. controlling your blood pressure and cholesterol drastically reduces the damage diabetes does to your body.  this also applies to quitting smoking.  please discuss these with your doctor.  you should take an aspirin every day, unless you have been advised by a doctor not to. check your blood sugar twice a day.  vary the time of day when you check, between before the 3 meals, and at bedtime.  also check if you have symptoms of your blood sugar being too high or too low.  please keep a record of the readings and bring it to your next appointment here.  please call us sooner if your blood sugar goes below 70, or if it stays over 200. For now, reduce the insulin to 7 units each morning.   Also, please continue the tradjenta for now.   Please come back for a follow-up appointment in 2 weeks.

## 2011-08-11 DIAGNOSIS — E1121 Type 2 diabetes mellitus with diabetic nephropathy: Secondary | ICD-10-CM | POA: Insufficient documentation

## 2011-08-11 DIAGNOSIS — E109 Type 1 diabetes mellitus without complications: Secondary | ICD-10-CM | POA: Insufficient documentation

## 2011-08-24 ENCOUNTER — Ambulatory Visit: Payer: Medicare Other | Admitting: Endocrinology

## 2011-08-24 DIAGNOSIS — Z0289 Encounter for other administrative examinations: Secondary | ICD-10-CM

## 2011-08-26 ENCOUNTER — Encounter: Payer: Self-pay | Admitting: Internal Medicine

## 2011-09-07 ENCOUNTER — Encounter: Payer: Self-pay | Admitting: Endocrinology

## 2011-09-07 ENCOUNTER — Ambulatory Visit (INDEPENDENT_AMBULATORY_CARE_PROVIDER_SITE_OTHER): Payer: Medicare Other | Admitting: Endocrinology

## 2011-09-07 VITALS — BP 110/82 | HR 100 | Temp 98.3°F | Ht 68.0 in | Wt 206.0 lb

## 2011-09-07 DIAGNOSIS — E1029 Type 1 diabetes mellitus with other diabetic kidney complication: Secondary | ICD-10-CM

## 2011-09-07 DIAGNOSIS — N058 Unspecified nephritic syndrome with other morphologic changes: Secondary | ICD-10-CM

## 2011-09-07 NOTE — Progress Notes (Signed)
Subjective:    Patient ID: Linda Ellison, female    DOB: May 12, 1975, 36 y.o.   MRN: [id number]  HPI Pt returns for f/u of type 2 dm.  Despite not taking any dm oral med or insulin, she has cbg's of low-100's.  She had hypoglycemia when she was on the insulin.   Past Medical History  Diagnosis Date  . Asthma   . Chronic kidney disease     started dialysis 01/29/11  . Weight loss   . Loss of appetite   . CHF (congestive heart failure)   . Hypertension   . Hyperlipidemia   . Diabetes mellitus age 49    uses insulin  . Renal disorder     Past Surgical History  Procedure Date  . Cesarean section   . Cholecystectomy   . Av fistula placement 08/2010    Left radiocephalic AVF  . Ligation goretex fistula 01/04/11    Left AVF    History   Social History  . Marital Status: Single    Spouse Name: N/A    Number of Children: N/A  . Years of Education: N/A   Occupational History  . Not on file.   Social History Main Topics  . Smoking status: Never Smoker   . Smokeless tobacco: Not on file  . Alcohol Use: No  . Drug Use: No  . Sexually Active:    Other Topics Concern  . 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 albuterol (PROVENTIL HFA;VENTOLIN HFA) 108 (90 BASE) MCG/ACT inhaler Inhale 2 puffs into the lungs every 6 (six) hours as needed. For shortness of breath      . amLODipine (NORVASC) 10 MG tablet Take 10 mg by mouth daily.        Marland Kitchen aspirin 81 MG tablet Take 81 mg by mouth daily.        . calcium carbonate (TUMS - DOSED IN MG ELEMENTAL CALCIUM) 500 MG chewable tablet Chew 1 tablet by mouth 3 (three) times daily with meals.      . hydrALAZINE (APRESOLINE) 25 MG tablet Take 25 mg by mouth at bedtime.      Marland Kitchen labetalol (NORMODYNE) 200 MG tablet Take 200 mg by mouth 2 (two) times  daily.       Marland Kitchen lisinopril (PRINIVIL,ZESTRIL) 20 MG tablet Take 20 mg by mouth at bedtime.      . multivitamin (RENA-VIT) TABS tablet Take 1 tablet by mouth daily.      . Simethicone (GAS-X PO) Take 1 tablet by mouth daily as needed. For gas      . simvastatin (ZOCOR) 5 MG tablet Take 5 mg by mouth at bedtime.        Allergies  Allergen Reactions  . Ciprofloxacin Itching and Nausea And Vomiting    Family History  Problem Relation Age of Onset  . Heart disease Mother     heart attack at 67 y/o-infected valve  . Kidney disease Mother     was a dialysis patient  . Heart disease Brother   . Kidney disease Maternal Grandmother     pre-dialysis  . Kidney failure Paternal Uncle   . Kidney failure Paternal Aunt   . Diabetes Father   . Hypertension Father     BP 110/82  Pulse 100  Temp(Src) 98.3 F (36.8 C) (Oral)  Ht 5\' 8"  (1.727 m)  Wt 206 lb (93.441 kg)  BMI 31.32 kg/m2  SpO2 99%  LMP 08/18/2011  Review of Systems Denies LOC    Objective:   Physical Exam VITAL SIGNS:  See vs page GENERAL: no distress SKIN:  Insulin injection sites at the anterior abdomen are normal     Assessment & Plan:  DM.  It is unclear why insulin requirements have decreased so much.

## 2011-09-07 NOTE — Patient Instructions (Addendum)
Please stay-off the diabetes meds for now. check your blood sugar once a day.  vary the time of day when you check, between before the 3 meals, and at bedtime.  also check if you have symptoms of your blood sugar being too high or too low.  please keep a record of the readings and bring it to your next appointment here.  please call us sooner if your blood sugar is over 150.  Please come back for a follow-up appointment in 3 months.

## 2011-09-23 ENCOUNTER — Ambulatory Visit: Payer: Medicare Other | Admitting: Gastroenterology

## 2011-09-28 ENCOUNTER — Encounter: Payer: Self-pay | Admitting: Internal Medicine

## 2011-09-28 ENCOUNTER — Other Ambulatory Visit (INDEPENDENT_AMBULATORY_CARE_PROVIDER_SITE_OTHER): Payer: Medicare Other

## 2011-09-28 ENCOUNTER — Ambulatory Visit (INDEPENDENT_AMBULATORY_CARE_PROVIDER_SITE_OTHER): Payer: Medicare Other | Admitting: Internal Medicine

## 2011-09-28 VITALS — BP 118/62 | HR 88 | Ht 66.0 in | Wt 203.0 lb

## 2011-09-28 DIAGNOSIS — R1030 Lower abdominal pain, unspecified: Secondary | ICD-10-CM

## 2011-09-28 DIAGNOSIS — R1115 Cyclical vomiting syndrome unrelated to migraine: Secondary | ICD-10-CM

## 2011-09-28 DIAGNOSIS — R197 Diarrhea, unspecified: Secondary | ICD-10-CM

## 2011-09-28 DIAGNOSIS — R109 Unspecified abdominal pain: Secondary | ICD-10-CM

## 2011-09-28 MED ORDER — PROCHLORPERAZINE 25 MG RE SUPP
25.0000 mg | Freq: Two times a day (BID) | RECTAL | Status: DC | PRN
Start: 2011-09-28 — End: 2012-06-26

## 2011-09-28 MED ORDER — MOVIPREP 100 G PO SOLR: ORAL | Status: DC

## 2011-09-28 NOTE — Patient Instructions (Addendum)
You have been scheduled for an endoscopy and colonoscopy with propofol. Please follow the written instructions given to you at your visit today. Please pick up your prep at the pharmacy within the next 1-3 days.  Your physician has requested that you go to the basement for the following lab work before leaving today: TSH, TTG, IGA

## 2011-09-28 NOTE — Progress Notes (Signed)
Subjective:    Patient ID: Linda Ellison, female    DOB: April 28, 1975, 36 y.o.   MRN: [id number] Referred by: Dr. Marval Regal  HPI This is a very pleasant young African American woman who has had about 2-1/2 months of persistent nausea and vomiting that is episodic but intense. She also has intermittent diarrhea though get constipated at times. The nausea and vomiting is intractable for a couple of days. She has tried promethazine tablets with limited success. When she becomes ill she does not want to eat. She has been losing some weight. It is not associated with particular foods or dialysis. It began when her son and daughter had some sort of gastrointestinal illness that was self-limited, but hers has been persistent though intermittent.  Diarrhea is described as watery loose stools that run right through her for a day or 2. She has not tried symptomatic therapy. She was her appetite when she has this. There is no bleeding. It is not always associated with nausea and vomiting. She does think she tends to have diarrhea after she gets her IV iron infusion on Fridays.  She does have some headache problems and think she had migraine headaches in the past. She will get headaches now but it comes after the vomiting begins. It is not always, either. He has peripheral neuropathy that is intermittent and not on therapy. She does not have retinopathy. She has been on dialysis since October of 2012. She takes only sliding-scale insulin at this time. Progress her GI review of systems is otherwise positive for some bloating and gas, some heartburn. He has been crampy lower quadrant abdominal pain associated with her symptoms as well.  She dialyzes Monday Wednesday Friday. Allergies  Allergen Reactions  . Ciprofloxacin Itching and Nausea And Vomiting   Outpatient Prescriptions Prior to Visit  Medication Sig Dispense Refill  . ACCU-CHEK AVIVA PLUS test strip Use to check blood sugar twice daily      .  ACCU-CHEK SOFTCLIX LANCETS lancets Use to check blood sugar twice daily      . albuterol (PROVENTIL HFA;VENTOLIN HFA) 108 (90 BASE) MCG/ACT inhaler Inhale 2 puffs into the lungs every 6 (six) hours as needed. For shortness of breath      . amLODipine (NORVASC) 10 MG tablet Take 10 mg by mouth daily.        Marland Kitchen aspirin 81 MG tablet Take 81 mg by mouth daily.        . B-D INS SYRINGE 0.5CC/31GX5/16 31G X 5/16" 0.5 ML MISC Use as directed once daily      . calcium carbonate (TUMS - DOSED IN MG ELEMENTAL CALCIUM) 500 MG chewable tablet Chew 1 tablet by mouth 3 (three) times daily with meals.      Marland Kitchen esomeprazole (NEXIUM) 20 MG capsule Take 20 mg by mouth daily before breakfast.      . hydrALAZINE (APRESOLINE) 25 MG tablet Take 25 mg by mouth at bedtime.      . Insulin Aspart Prot & Aspart (NOVOLOG MIX 70/30 Bonney) Inject into the skin. Takes if blood sugar is over 150 then will take 7 units      . labetalol (NORMODYNE) 200 MG tablet Take 200 mg by mouth 2 (two) times daily.       Marland Kitchen lisinopril (PRINIVIL,ZESTRIL) 20 MG tablet Take 20 mg by mouth at bedtime.      . multivitamin (RENA-VIT) TABS tablet Take 1 tablet by mouth daily.      . promethazine (PHENERGAN) 25  MG tablet Take 25 mg by mouth every 6 (six) hours as needed.      . Simethicone (GAS-X PO) Take 1 tablet by mouth daily as needed. For gas      . simvastatin (ZOCOR) 5 MG tablet Take 5 mg by mouth at bedtime.      . sorbitol 70 % solution Take 30 mLs by mouth 2 (two) times daily as needed.       Past Medical History  Diagnosis Date  . Asthma   . Chronic kidney disease     started dialysis 01/29/11  . Weight loss   . Loss of appetite   . CHF (congestive heart failure)   . Hypertension   . Hyperlipidemia   . Diabetes mellitus age 65  . Peripheral neuropathy   . Iron deficiency anemia   . Hyperparathyroidism, secondary   . Sciatica   . GERD (gastroesophageal reflux disease)    Past Surgical History  Procedure Date  . Cesarean section     . Cholecystectomy 1996  . Av fistula placement 08/2010    Left radiocephalic AVF  . Ligation goretex fistula 01/04/11    Left AVF   History   Social History  . Marital Status: Single    Spouse Name: N/A    Number of Children: 2  .     Occupational History  . Unemployed    Social History Main Topics  . Smoking status: Never Smoker   . Smokeless tobacco: Never Used  . Alcohol Use: No  . 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 Heart disease Mother     heart attack at 68 y/o-infected valve  . Kidney disease Mother     was a dialysis patient  . Heart disease Brother   . Kidney disease Maternal Grandmother     pre-dialysis  . Kidney failure Paternal Uncle   . Kidney failure Paternal Aunt   . Diabetes Father   . Hypertension Father   . Colon cancer Neg Hx        Review of Systems This is positive for things mentioned above. Also insomnia, some dyspnea, allergies and sinus problems, back pain and she's had some hematuria. She had hypoglycemia spell with chest pain in March 2013 and was hospitalized. All other review of systems are negative.    Objective:   Physical Exam General:  Well-developed, well-nourished and in no acute distress Eyes:  anicteric. ENT:   Mouth and posterior pharynx free of lesions.  Neck:   supple w/o thyromegaly or mass.  Lungs: Clear to auscultation bilaterally. Heart:  S1S2, no rubs, murmurs, gallops. Abdomen:  soft, non-tender, no hepatosplenomegaly, hernia, or mass and BS+. There is no succussion splash Rectal: Deferred until colonoscopy Lymph:  no cervical or supraclavicular adenopathy. Extremities:   no edema there is a palpable thrill in a functioning AV fistula in the left forearm Skin   no rash. Neuro:  A&O x 3.  Psych:  appropriate mood and  Affect.   Data  Reviewed: Lab Results  Component Value Date   TSH 4.944* 10/19/2010   May 15 hemoglobin 12 BUN 35 July 28 2011 hemoglobin 12.9 white cell 4.6 platelets 340. Creatinine 5.5 potassium 5.3. Ferritin 380. Other electrolytes and liver chemistries normal or low. Hemoglobin A1c 5.8 07/28/2011 with a negative  hepatitis B surface antigen.  I've also reviewed hospitalization discharge summary of March 2013.        Assessment & Plan:   1. Persistent vomiting   2. Diarrhea   3. Lower abdominal pain    Cause of these problems is not entirely clear. I'm not sure if both vomiting and diarrhea are related are separate issues. Given that she is a type I diabetic it's possible she could have celiac disease though and IgA antibody and a tissue transglutaminase antibody will be checked. I've also schedule her for an upper endoscopy and colonoscopy to investigate these problems.The risks and benefits as well as alternatives of endoscopic procedure(s) have been discussed and reviewed. All questions answered. The patient agrees to proceed.  She may have a functional disturbance related to diabetic neuropathy (gastroparesis, diabetic enteropathy). There is some association of the diarrhea with her IV iron infusions on Fridays. She does get constipated at times which goes along with IBS but given her multitude of symptoms, weight loss and risk for ischemia and such I think a colonoscopy is appropriate along with an upper GI endoscopy.  For the time being I prescribed Compazine suppositories to be used for severe nausea and vomiting.  I appreciate the opportunity to care for this patient.   CC: Elyn Peers, MD, Edrick Oh, MD, Donato Heinz, MD

## 2011-09-29 LAB — TISSUE TRANSGLUTAMINASE, IGA: Tissue Transglutaminase Ab, IgA: 5.8 U/mL (ref <20)

## 2011-09-29 NOTE — Progress Notes (Signed)
Quick Note:  Celiac testing is negative - we can let her know Why did she wait 2 months to schedule her procedures? Not necessarily wrong but I would recommend she do sooner ______

## 2011-09-30 ENCOUNTER — Telehealth: Payer: Self-pay

## 2011-09-30 ENCOUNTER — Telehealth: Payer: Self-pay | Admitting: Internal Medicine

## 2011-09-30 NOTE — Telephone Encounter (Signed)
Pt's questions answered regarding her upcoming colonoscopy and times to start the prep.  Informed to bring her inhalers to her appointment per info we got from upstairs today , new rules.  Also told her that her Celiac test were negative.  She will call back with any further questions.

## 2011-10-08 NOTE — Telephone Encounter (Signed)
ECL moved up sooner/yf

## 2011-10-19 ENCOUNTER — Encounter: Payer: Self-pay | Admitting: Internal Medicine

## 2011-10-19 ENCOUNTER — Ambulatory Visit (AMBULATORY_SURGERY_CENTER): Payer: Medicare Other | Admitting: Internal Medicine

## 2011-10-19 VITALS — BP 144/97 | HR 96 | Temp 96.4°F | Resp 16 | Ht 66.0 in | Wt 203.0 lb

## 2011-10-19 DIAGNOSIS — R197 Diarrhea, unspecified: Secondary | ICD-10-CM

## 2011-10-19 DIAGNOSIS — R1115 Cyclical vomiting syndrome unrelated to migraine: Secondary | ICD-10-CM

## 2011-10-19 DIAGNOSIS — D126 Benign neoplasm of colon, unspecified: Secondary | ICD-10-CM

## 2011-10-19 DIAGNOSIS — K2210 Ulcer of esophagus without bleeding: Secondary | ICD-10-CM

## 2011-10-19 HISTORY — PX: COLONOSCOPY WITH ESOPHAGOGASTRODUODENOSCOPY (EGD): SHX5779

## 2011-10-19 HISTORY — PX: ESOPHAGOGASTRODUODENOSCOPY: SHX1529

## 2011-10-19 MED ORDER — SODIUM CHLORIDE 0.9 % IV SOLN: 500.0000 mL | INTRAVENOUS | Status: DC

## 2011-10-19 MED ORDER — OMEPRAZOLE 20 MG PO CPDR: 20.0000 mg | DELAYED_RELEASE_CAPSULE | Freq: Every day | ORAL | Status: DC

## 2011-10-19 NOTE — Progress Notes (Signed)
Last dialysis Monday 7/15

## 2011-10-19 NOTE — Op Note (Signed)
[name] [name], [name]  [id number]  ENDOSCOPY PROCEDURE REPORT  PATIENT:  Linda Ellison, Linda Ellison  MR#:  [id number] BIRTHDATE:  November 17, 1975, 35 yrs. old  GENDER:  female  ENDOSCOPIST:  Gatha Mayer, MD, [facility] Referred by:  Lucianne Lei, M.D.  PROCEDURE DATE:  10/19/2011 PROCEDURE:  EGD, diagnostic 43235 ASA CLASS:  Class III INDICATIONS:  vomiting, diarrhea  MEDICATIONS:   These medications were titrated to patient response per physician's verbal order, MAC sedation, administered by CRNA, propofol (Diprivan) 160 mg IV TOPICAL ANESTHETIC:  none  DESCRIPTION OF PROCEDURE:   After the risks benefits and alternatives of the procedure were thoroughly explained, informed consent was obtained.  The [facility] GIF-H180 [id number] endoscope was introduced through the mouth and advanced to the second portion of the duodenum, without limitations.  The instrument was slowly withdrawn as the mucosa was fully examined. <<PROCEDUREIMAGES>>  Multiple erosions were found in the distal esophagus. 2 small erosions were seen in the distal esophagus.  Otherwise the examination was normal.    Retroflexed views revealed no abnormalities.    The scope was then withdrawn from the patient and the procedure completed.  COMPLICATIONS:  None  ENDOSCOPIC IMPRESSION: 1) Erosions, multiple in the distal esophagus 2) Otherwise normal examination RECOMMENDATIONS: 1) start omeprazole 20 mg daily - prescription sent to pharmacy  2) colonoscopy next  Gatha Mayer, MD, Marval Regal  CC:  Lucianne Lei, MD, Edrick Oh, MD and The Patient  n. eSIGNED:   Gatha Mayer at 10/19/2011 02:42 PM  Sandi Mariscal, [id number]

## 2011-10-19 NOTE — Op Note (Addendum)
[facility] [address] [facility] [address]. [place], [place]  [id number]  COLONOSCOPY PROCEDURE REPORT  PATIENT:  Varnell, Keaveney  MR#:  [id number] BIRTHDATE:  08-20-1975, 35 yrs. old  GENDER:  female ENDOSCOPIST:  Gatha Mayer, MD, [facility] REF. BY:  Lucianne Lei, M.D. PROCEDURE DATE:  10/19/2011 PROCEDURE:  Colonoscopy with biopsy and snare polypectomy ADDENDUM: and biopsy ASA CLASS:  Class III INDICATIONS:  unexplained diarrhea MEDICATIONS:   There was residual sedation effect present from prior procedure., These medications were titrated to patient response per physician's verbal order, MAC sedation, administered by CRNA, propofol (Diprivan) 160 mg IV  DESCRIPTION OF PROCEDURE:   After the risks benefits and alternatives of the procedure were thoroughly explained, informed consent was obtained.  Digital rectal exam was performed and revealed no abnormalities.   The LB [id number] endoscope was introduced through the anus and advanced to the terminal ileum which was intubated for a short distance, without limitations. The quality of the prep was excellent, using MoviPrep.  The instrument was then slowly withdrawn as the colon was fully examined. <<PROCEDUREIMAGES>> FINDINGS:  The terminal ileum appeared normal.  A sessile polyp was found in the descending colon. It was 6 mm in size. Polyp was snared without cautery. Retrieval was successful.  This was otherwise a normal examination of the colon.   Retroflexed views in the rectum revealed no abnormalities.   ADDENDUM: random colon biopsies were taken. The time to cecum = 1:11 minutes. The scope was then withdrawn in 7 minutes from the cecum and the procedure completed. COMPLICATIONS:  None ENDOSCOPIC IMPRESSION: 1) 6 mm sessile polyp in the descending colon - removed 2) Otherwise normal examination including terminal ileum RECOMMENDATIONS: 1) Await biopsy results - will contact REPEAT EXAM:  In for Colonoscopy, pending biopsy  results.  Gatha Mayer, MD, Marval Regal  CC:  Lucianne Lei, MD, Edrick Oh, MD and The Patient  n. REVISED:  10/21/2011 09:14 AM eSIGNED:   Gatha Mayer at 10/21/2011 09:14 AM  Sandi Mariscal, [id number]

## 2011-10-19 NOTE — Progress Notes (Signed)
Patient did not experience any of the following events: a burn prior to discharge; a fall within the facility; wrong site/side/patient/procedure/implant event; or a hospital transfer or hospital admission upon discharge from the facility. (G8907) Patient did not have preoperative order for IV antibiotic SSI prophylaxis. (G8918)  

## 2011-10-19 NOTE — Patient Instructions (Addendum)
The upper endoscopy showed an irritated and inflamed esophagus - "erosive esophagitis". I think acid reflux and vomiting caused this. Omeprazole was prescribed, take every day before breakfast.  The colonoscopy showed a small polyp, not related to your problems. It was removed.   The rest of the colon and end of the small intestine looked good, normal. Biopsies of the lining were taken and I will let you know if this explains why you have diarrhea.  Thank you for choosing [facility].  Gatha Mayer, MD, FACG YOU HAD AN ENDOSCOPIC PROCEDURE TODAY AT [facility]: Refer to the procedure report that was given to you for any specific questions about what was found during the examination.  If the procedure report does not answer your questions, please call your gastroenterologist to clarify.  If you requested that your care partner not be given the details of your procedure findings, then the procedure report has been included in a sealed envelope for you to review at your convenience later.  YOU SHOULD EXPECT: Some feelings of bloating in the abdomen. Passage of more gas than usual.  Walking can help get rid of the air that was put into your GI tract during the procedure and reduce the bloating. If you had a lower endoscopy (such as a colonoscopy or flexible sigmoidoscopy) you may notice spotting of blood in your stool or on the toilet paper. If you underwent a bowel prep for your procedure, then you may not have a normal bowel movement for a few days.  DIET: Your first meal following the procedure should be a light meal and then it is ok to progress to your normal diet.  A half-sandwich or bowl of soup is an example of a good first meal.  Heavy or fried foods are harder to digest and may make you feel nauseous or bloated.  Likewise meals heavy in dairy and vegetables can cause extra gas to form and this can also increase the bloating.  Drink plenty of fluids but you should  avoid alcoholic beverages for 24 hours.  ACTIVITY: Your care partner should take you home directly after the procedure.  You should plan to take it easy, moving slowly for the rest of the day.  You can resume normal activity the day after the procedure however you should NOT DRIVE or use heavy machinery for 24 hours (because of the sedation medicines used during the test).    SYMPTOMS TO REPORT IMMEDIATELY: A gastroenterologist can be reached at any hour.  During normal business hours, 8:30 AM to 5:00 PM Monday through Friday, call [phone].  After hours and on weekends, please call the GI answering service at [phone] who will take a message and have the physician on call contact you.   Following lower endoscopy (colonoscopy or flexible sigmoidoscopy):  Excessive amounts of blood in the stool  Significant tenderness or worsening of abdominal pains  Swelling of the abdomen that is new, acute  Fever of 100F or higher  Following upper endoscopy (EGD)  Vomiting of blood or coffee ground material  New chest pain or pain under the shoulder blades  Painful or persistently difficult swallowing  New shortness of breath  Fever of 100F or higher  Black, tarry-looking stools  FOLLOW UP: If any biopsies were taken you will be contacted by phone or by letter within the next 1-3 weeks.  Call your gastroenterologist if you have not heard about the biopsies in 3 weeks.  Our staff  will call the home number listed on your records the next business day following your procedure to check on you and address any questions or concerns that you may have at that time regarding the information given to you following your procedure. This is a courtesy call and so if there is no answer at the home number and we have not heard from you through the emergency physician on call, we will assume that you have returned to your regular daily activities without incident.  SIGNATURES/CONFIDENTIALITY: You and/or  your care partner have signed paperwork which will be entered into your electronic medical record.  These signatures attest to the fact that that the information above on your After Visit Summary has been reviewed and is understood.  Full responsibility of the confidentiality of this discharge information lies with you and/or your care-partner.   Polyp and gerd information given .

## 2011-10-20 ENCOUNTER — Telehealth: Payer: Self-pay | Admitting: *Deleted

## 2011-10-20 NOTE — Telephone Encounter (Signed)
  Follow up Call-  Call back number 10/19/2011  Post procedure Call Back phone  # [phone]  Permission to leave phone message Yes     [facility]

## 2011-10-26 ENCOUNTER — Encounter: Payer: Self-pay | Admitting: Internal Medicine

## 2011-10-26 DIAGNOSIS — Z860100 Personal history of colon polyps, unspecified: Secondary | ICD-10-CM | POA: Insufficient documentation

## 2011-10-26 NOTE — Progress Notes (Signed)
Quick Note:  Office - please call her and tell her colon bxs ok no colitis and she has a benign polyp  Needs colonoscopy 5 years  Needs gastric emptying study re: nausea and vomiting (4 hour study) and she needs to be off promethazine and compazine 24 hrs before  Office visit me about 1-2 weeks after study  [place]  No letter 10/2016 recall colonoscopy ______

## 2011-10-26 NOTE — Progress Notes (Signed)
Quick Note:  6 mm serrated adenoma Repeat colonoscopy 10/2016 ______

## 2011-10-27 ENCOUNTER — Other Ambulatory Visit: Payer: Self-pay | Admitting: *Deleted

## 2011-10-27 DIAGNOSIS — R112 Nausea with vomiting, unspecified: Secondary | ICD-10-CM

## 2011-10-28 NOTE — Progress Notes (Signed)
Quick Note:  Go ahead with 2 hour study ______

## 2011-11-09 ENCOUNTER — Encounter (HOSPITAL_COMMUNITY)
Admission: RE | Admit: 2011-11-09 | Discharge: 2011-11-09 | Disposition: A | Discharge status: Home / Self Care | Payer: Medicare Other | Source: Ambulatory Visit | Attending: Internal Medicine | Admitting: Internal Medicine

## 2011-11-09 DIAGNOSIS — R112 Nausea with vomiting, unspecified: Secondary | ICD-10-CM

## 2011-11-11 ENCOUNTER — Encounter (HOSPITAL_COMMUNITY): Payer: Self-pay

## 2011-11-11 ENCOUNTER — Encounter (HOSPITAL_COMMUNITY)
Admission: RE | Admit: 2011-11-11 | Discharge: 2011-11-11 | Disposition: A | Discharge status: Home / Self Care | Payer: Medicare Other | Source: Ambulatory Visit | Attending: Internal Medicine | Admitting: Internal Medicine

## 2011-11-11 DIAGNOSIS — R6881 Early satiety: Secondary | ICD-10-CM | POA: Insufficient documentation

## 2011-11-11 DIAGNOSIS — R112 Nausea with vomiting, unspecified: Secondary | ICD-10-CM | POA: Insufficient documentation

## 2011-11-11 DIAGNOSIS — K3189 Other diseases of stomach and duodenum: Secondary | ICD-10-CM | POA: Insufficient documentation

## 2011-11-11 MED ORDER — TECHNETIUM TC 99M SULFUR COLLOID
2.2000 | Freq: Once | INTRAVENOUS | Status: AC | PRN
  Administered 2011-11-11: 2.2 via ORAL

## 2011-11-11 NOTE — Progress Notes (Signed)
Quick Note:  Let her know stomach does not empty well  Mail her a gastroparesis diet and she should try that - step 3 to start and use others if having problems as it explains See me in 1 month (about) ______

## 2011-11-23 ENCOUNTER — Encounter: Payer: Medicare Other | Admitting: Internal Medicine

## 2011-12-07 ENCOUNTER — Ambulatory Visit: Payer: Medicare Other | Admitting: Endocrinology

## 2011-12-14 ENCOUNTER — Ambulatory Visit: Payer: Medicare Other | Admitting: Endocrinology

## 2011-12-14 DIAGNOSIS — Z0289 Encounter for other administrative examinations: Secondary | ICD-10-CM

## 2011-12-21 ENCOUNTER — Ambulatory Visit: Payer: Medicare Other | Admitting: Internal Medicine

## 2011-12-24 ENCOUNTER — Ambulatory Visit: Payer: Medicare Other | Admitting: Internal Medicine

## 2011-12-24 ENCOUNTER — Emergency Department (HOSPITAL_COMMUNITY)
Admission: EM | Admit: 2011-12-24 | Discharge: 2011-12-24 | Disposition: A | Discharge status: Home / Self Care | Payer: Medicare Other | Attending: Emergency Medicine | Admitting: Emergency Medicine

## 2011-12-24 ENCOUNTER — Emergency Department (HOSPITAL_COMMUNITY): Payer: Medicare Other

## 2011-12-24 ENCOUNTER — Encounter (HOSPITAL_COMMUNITY): Payer: Self-pay | Admitting: Emergency Medicine

## 2011-12-24 DIAGNOSIS — S20229A Contusion of unspecified back wall of thorax, initial encounter: Secondary | ICD-10-CM | POA: Insufficient documentation

## 2011-12-24 DIAGNOSIS — Z794 Long term (current) use of insulin: Secondary | ICD-10-CM | POA: Insufficient documentation

## 2011-12-24 DIAGNOSIS — N186 End stage renal disease: Secondary | ICD-10-CM | POA: Insufficient documentation

## 2011-12-24 DIAGNOSIS — R109 Unspecified abdominal pain: Secondary | ICD-10-CM | POA: Insufficient documentation

## 2011-12-24 DIAGNOSIS — E119 Type 2 diabetes mellitus without complications: Secondary | ICD-10-CM | POA: Insufficient documentation

## 2011-12-24 DIAGNOSIS — Z992 Dependence on renal dialysis: Secondary | ICD-10-CM | POA: Insufficient documentation

## 2011-12-24 DIAGNOSIS — I120 Hypertensive chronic kidney disease with stage 5 chronic kidney disease or end stage renal disease: Secondary | ICD-10-CM | POA: Insufficient documentation

## 2011-12-24 LAB — URINE MICROSCOPIC-ADD ON

## 2011-12-24 LAB — URINALYSIS, ROUTINE W REFLEX MICROSCOPIC
Hgb urine dipstick: NEGATIVE
Protein, ur: >300 mg/dL — AB
Urobilinogen, UA: 0.2 mg/dL (ref 0.0–1.0)

## 2011-12-24 MED ORDER — TRAMADOL HCL 50 MG PO TABS: 50.0000 mg | ORAL_TABLET | Freq: Four times a day (QID) | ORAL | Status: DC | PRN

## 2011-12-24 MED ORDER — OXYCODONE-ACETAMINOPHEN 5-325 MG PO TABS
2.0000 | ORAL_TABLET | Freq: Once | ORAL | Status: AC
  Administered 2011-12-24: 2 via ORAL
  Filled 2011-12-24: qty 2
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
## 2011-12-24 NOTE — ED Notes (Signed)
MD at bedside. 
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
## 2011-12-24 NOTE — ED Notes (Signed)
Pt returned from CT °

## 2011-12-24 NOTE — ED Notes (Signed)
PT ambulated with baseline gait; VSS; A&Ox3; no signs of distress; respirations even and unlabored; skin warm and dry; no questions upon discharge.  
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
## 2012-01-17 ENCOUNTER — Ambulatory Visit: Payer: Self-pay | Admitting: Obstetrics and Gynecology

## 2012-01-20 ENCOUNTER — Ambulatory Visit: Payer: Self-pay | Admitting: Obstetrics and Gynecology

## 2012-02-01 ENCOUNTER — Ambulatory Visit: Payer: Medicare Other | Admitting: Internal Medicine

## 2012-02-29 ENCOUNTER — Ambulatory Visit: Payer: Medicaid Other | Admitting: Obstetrics and Gynecology

## 2012-03-08 ENCOUNTER — Other Ambulatory Visit: Payer: Self-pay

## 2012-03-08 MED ORDER — OMEPRAZOLE 20 MG PO CPDR: 20.0000 mg | DELAYED_RELEASE_CAPSULE | Freq: Every day | ORAL | Status: DC

## 2012-03-08 NOTE — Telephone Encounter (Signed)
Faxed the refill request for omeprazole 20mg  to [place] at [phone].

## 2012-03-14 ENCOUNTER — Ambulatory Visit (INDEPENDENT_AMBULATORY_CARE_PROVIDER_SITE_OTHER): Payer: Medicaid Other | Admitting: Obstetrics and Gynecology

## 2012-03-14 ENCOUNTER — Encounter: Payer: Self-pay | Admitting: Obstetrics and Gynecology

## 2012-03-14 ENCOUNTER — Other Ambulatory Visit: Payer: Self-pay | Admitting: Obstetrics and Gynecology

## 2012-03-14 VITALS — BP 104/60 | HR 70 | Resp 16 | Ht 66.0 in | Wt 209.0 lb

## 2012-03-14 DIAGNOSIS — Z0000 Encounter for general adult medical examination without abnormal findings: Secondary | ICD-10-CM

## 2012-03-14 DIAGNOSIS — Z124 Encounter for screening for malignant neoplasm of cervix: Secondary | ICD-10-CM

## 2012-03-14 DIAGNOSIS — Z01419 Encounter for gynecological examination (general) (routine) without abnormal findings: Secondary | ICD-10-CM

## 2012-03-14 NOTE — Progress Notes (Signed)
Contraception BTL Last pap 06/24/2011 WNL Last Mammo None Last Colonoscopy 11/2011 (one benign polyp removed)  Last Dexa Scan None Primary MD Lucianne Lei Abuse at Home none  No complaints  Filed Vitals:   03/14/12 1642  BP: 104/60  Pulse: 70  Resp: 16   ROS: noncontributory  Physical Examination: General appearance - alert, well appearing, and in no distress Neck - supple, no significant adenopathy Chest - clear to auscultation, no wheezes, rales or rhonchi, symmetric air entry Heart - normal rate and regular rhythm Abdomen - soft, nontender, nondistended, no masses or organomegaly Breasts - breasts appear normal, no suspicious masses, no skin or nipple changes or axillary nodes Pelvic - normal external genitalia, vulva, vagina, cervix, uterus and adnexa Back exam - no CVAT Extremities - no edema, redness or tenderness in the calves or thighs  A/P Pap today 12yr for AEX

## 2012-03-15 LAB — PAP IG W/ RFLX HPV ASCU

## 2012-06-26 ENCOUNTER — Encounter (HOSPITAL_COMMUNITY): Payer: Self-pay | Admitting: Nurse Practitioner

## 2012-06-26 ENCOUNTER — Emergency Department (HOSPITAL_COMMUNITY): Payer: Medicare Other

## 2012-06-26 ENCOUNTER — Inpatient Hospital Stay (HOSPITAL_COMMUNITY)
Admission: EM | Admit: 2012-06-26 | Discharge: 2012-06-28 | DRG: 286 | Disposition: A | Discharge status: Home / Self Care | Payer: Medicare Other | Attending: Cardiovascular Disease | Admitting: Cardiovascular Disease

## 2012-06-26 DIAGNOSIS — R079 Chest pain, unspecified: Secondary | ICD-10-CM | POA: Diagnosis present

## 2012-06-26 DIAGNOSIS — Z833 Family history of diabetes mellitus: Secondary | ICD-10-CM

## 2012-06-26 DIAGNOSIS — M5430 Sciatica, unspecified side: Secondary | ICD-10-CM | POA: Diagnosis present

## 2012-06-26 DIAGNOSIS — D631 Anemia in chronic kidney disease: Secondary | ICD-10-CM | POA: Diagnosis present

## 2012-06-26 DIAGNOSIS — J45909 Unspecified asthma, uncomplicated: Secondary | ICD-10-CM | POA: Diagnosis present

## 2012-06-26 DIAGNOSIS — Z992 Dependence on renal dialysis: Secondary | ICD-10-CM

## 2012-06-26 DIAGNOSIS — I2510 Atherosclerotic heart disease of native coronary artery without angina pectoris: Secondary | ICD-10-CM | POA: Diagnosis present

## 2012-06-26 DIAGNOSIS — Z79899 Other long term (current) drug therapy: Secondary | ICD-10-CM

## 2012-06-26 DIAGNOSIS — Z8249 Family history of ischemic heart disease and other diseases of the circulatory system: Secondary | ICD-10-CM

## 2012-06-26 DIAGNOSIS — K3184 Gastroparesis: Secondary | ICD-10-CM | POA: Diagnosis present

## 2012-06-26 DIAGNOSIS — I509 Heart failure, unspecified: Secondary | ICD-10-CM | POA: Diagnosis present

## 2012-06-26 DIAGNOSIS — Z7982 Long term (current) use of aspirin: Secondary | ICD-10-CM

## 2012-06-26 DIAGNOSIS — E119 Type 2 diabetes mellitus without complications: Secondary | ICD-10-CM | POA: Diagnosis present

## 2012-06-26 DIAGNOSIS — K219 Gastro-esophageal reflux disease without esophagitis: Secondary | ICD-10-CM | POA: Diagnosis present

## 2012-06-26 DIAGNOSIS — Z794 Long term (current) use of insulin: Secondary | ICD-10-CM

## 2012-06-26 DIAGNOSIS — E785 Hyperlipidemia, unspecified: Secondary | ICD-10-CM | POA: Diagnosis present

## 2012-06-26 DIAGNOSIS — N2581 Secondary hyperparathyroidism of renal origin: Secondary | ICD-10-CM | POA: Diagnosis present

## 2012-06-26 DIAGNOSIS — G609 Hereditary and idiopathic neuropathy, unspecified: Secondary | ICD-10-CM | POA: Diagnosis present

## 2012-06-26 DIAGNOSIS — I200 Unstable angina: Secondary | ICD-10-CM

## 2012-06-26 DIAGNOSIS — R0789 Other chest pain: Principal | ICD-10-CM | POA: Diagnosis present

## 2012-06-26 DIAGNOSIS — I120 Hypertensive chronic kidney disease with stage 5 chronic kidney disease or end stage renal disease: Secondary | ICD-10-CM | POA: Diagnosis present

## 2012-06-26 DIAGNOSIS — N186 End stage renal disease: Secondary | ICD-10-CM | POA: Diagnosis present

## 2012-06-26 HISTORY — DX: Atherosclerotic heart disease of native coronary artery without angina pectoris: I25.10

## 2012-06-26 LAB — GLUCOSE, CAPILLARY: Glucose-Capillary: 91 mg/dL (ref 70–99)

## 2012-06-26 LAB — BASIC METABOLIC PANEL
BUN: 46 mg/dL — ABNORMAL HIGH (ref 6–23)
Chloride: 103 mEq/L (ref 96–112)
GFR calc Af Amer: 8 mL/min — ABNORMAL LOW (ref >90)
Potassium: 5.1 mEq/L (ref 3.5–5.1)
Sodium: 139 mEq/L (ref 135–145)

## 2012-06-26 LAB — POCT I-STAT TROPONIN I: Troponin i, poc: 0 ng/mL (ref 0.00–0.08)

## 2012-06-26 LAB — TROPONIN I: Troponin I: <0.3 ng/mL (ref <0.30)

## 2012-06-26 LAB — CBC
HCT: 35.4 % — ABNORMAL LOW (ref 36.0–46.0)
RDW: 12.6 % (ref 11.5–15.5)
WBC: 4.8 10*3/uL (ref 4.0–10.5)

## 2012-06-26 LAB — PRO B NATRIURETIC PEPTIDE: Pro B Natriuretic peptide (BNP): 1250 pg/mL — ABNORMAL HIGH (ref 0–125)

## 2012-06-26 MED ORDER — ASPIRIN 81 MG PO CHEW
324.0000 mg | CHEWABLE_TABLET | ORAL | Status: AC
  Administered 2012-06-27: 324 mg via ORAL

## 2012-06-26 MED ORDER — ONDANSETRON HCL 4 MG/2ML IJ SOLN: 4.0000 mg | Freq: Four times a day (QID) | INTRAMUSCULAR | Status: DC | PRN

## 2012-06-26 MED ORDER — ASPIRIN 81 MG PO CHEW
324.0000 mg | CHEWABLE_TABLET | ORAL | Status: AC
  Filled 2012-06-26: qty 4

## 2012-06-26 MED ORDER — LINAGLIPTIN 5 MG PO TABS
5.0000 mg | ORAL_TABLET | Freq: Every day | ORAL | Status: DC
  Filled 2012-06-26 (×2): qty 1

## 2012-06-26 MED ORDER — ASPIRIN EC 81 MG PO TBEC
81.0000 mg | DELAYED_RELEASE_TABLET | Freq: Every day | ORAL | Status: DC
  Filled 2012-06-26: qty 1

## 2012-06-26 MED ORDER — HEPARIN (PORCINE) IN NACL 100-0.45 UNIT/ML-% IJ SOLN
1200.0000 [IU]/h | INTRAMUSCULAR | Status: DC
  Administered 2012-06-26: 1000 [IU]/h via INTRAVENOUS
  Administered 2012-06-27: 1200 [IU]/h via INTRAVENOUS
  Filled 2012-06-26 (×2): qty 250

## 2012-06-26 MED ORDER — ACETAMINOPHEN 325 MG PO TABS: 650.0000 mg | ORAL_TABLET | ORAL | Status: DC | PRN

## 2012-06-26 MED ORDER — AMLODIPINE BESYLATE 10 MG PO TABS
10.0000 mg | ORAL_TABLET | Freq: Every day | ORAL | Status: DC
  Administered 2012-06-26 – 2012-06-27 (×2): 10 mg via ORAL
  Filled 2012-06-26 (×2): qty 1

## 2012-06-26 MED ORDER — LABETALOL HCL 200 MG PO TABS
200.0000 mg | ORAL_TABLET | Freq: Two times a day (BID) | ORAL | Status: DC
  Administered 2012-06-26 – 2012-06-27 (×2): 200 mg via ORAL
  Filled 2012-06-26 (×3): qty 1

## 2012-06-26 MED ORDER — SEVELAMER CARBONATE 800 MG PO TABS
1600.0000 mg | ORAL_TABLET | Freq: Three times a day (TID) | ORAL | Status: DC
  Filled 2012-06-26 (×4): qty 2

## 2012-06-26 MED ORDER — METOPROLOL TARTRATE 1 MG/ML IV SOLN
5.0000 mg | INTRAVENOUS | Status: DC | PRN
  Filled 2012-06-26: qty 5

## 2012-06-26 MED ORDER — HYDRALAZINE HCL 25 MG PO TABS
25.0000 mg | ORAL_TABLET | Freq: Every day | ORAL | Status: DC
  Administered 2012-06-26: 25 mg via ORAL
  Filled 2012-06-26 (×2): qty 1

## 2012-06-26 MED ORDER — NITROGLYCERIN 0.4 MG SL SUBL: 0.4000 mg | SUBLINGUAL_TABLET | SUBLINGUAL | Status: DC | PRN

## 2012-06-26 MED ORDER — SIMVASTATIN 5 MG PO TABS
5.0000 mg | ORAL_TABLET | Freq: Every day | ORAL | Status: DC
  Administered 2012-06-26: 5 mg via ORAL
  Filled 2012-06-26 (×2): qty 1

## 2012-06-26 MED ORDER — NITROGLYCERIN 2 % TD OINT
0.5000 [in_us] | TOPICAL_OINTMENT | Freq: Once | TRANSDERMAL | Status: AC
  Administered 2012-06-26: 0.5 [in_us] via TOPICAL
  Filled 2012-06-26: qty 1

## 2012-06-26 MED ORDER — SODIUM CHLORIDE 0.9 % IJ SOLN
3.0000 mL | Freq: Two times a day (BID) | INTRAMUSCULAR | Status: DC
  Administered 2012-06-27: 11:00:00 via INTRAVENOUS

## 2012-06-26 MED ORDER — ONDANSETRON HCL 4 MG/2ML IJ SOLN
4.0000 mg | Freq: Once | INTRAMUSCULAR | Status: AC
  Administered 2012-06-26: 4 mg via INTRAVENOUS
  Filled 2012-06-26: qty 2

## 2012-06-26 MED ORDER — SODIUM CHLORIDE 0.9 % IJ SOLN: 3.0000 mL | Freq: Two times a day (BID) | INTRAMUSCULAR | Status: DC

## 2012-06-26 MED ORDER — PANTOPRAZOLE SODIUM 40 MG PO TBEC
40.0000 mg | DELAYED_RELEASE_TABLET | Freq: Every day | ORAL | Status: DC
  Administered 2012-06-26: 40 mg via ORAL
  Filled 2012-06-26: qty 1

## 2012-06-26 MED ORDER — SODIUM CHLORIDE 0.9 % IV SOLN: 250.0000 mL | INTRAVENOUS | Status: DC | PRN

## 2012-06-26 MED ORDER — SORBITOL 70 % PO SOLN
30.0000 mL | Freq: Two times a day (BID) | ORAL | Status: DC | PRN
  Filled 2012-06-26: qty 30

## 2012-06-26 MED ORDER — ASPIRIN 300 MG RE SUPP
300.0000 mg | RECTAL | Status: AC
  Filled 2012-06-26: qty 1

## 2012-06-26 MED ORDER — SODIUM CHLORIDE 0.9 % IJ SOLN: 3.0000 mL | INTRAMUSCULAR | Status: DC | PRN

## 2012-06-26 MED ORDER — MORPHINE SULFATE 4 MG/ML IJ SOLN
4.0000 mg | Freq: Once | INTRAMUSCULAR | Status: AC
  Administered 2012-06-26: 4 mg via INTRAVENOUS
  Filled 2012-06-26: qty 1

## 2012-06-26 MED ORDER — RENA-VITE PO TABS
1.0000 | ORAL_TABLET | Freq: Every day | ORAL | Status: DC
  Administered 2012-06-26: 23:00:00 via ORAL
  Filled 2012-06-26 (×2): qty 1

## 2012-06-26 MED ORDER — INSULIN ASPART PROT & ASPART (70-30 MIX) 100 UNIT/ML ~~LOC~~ SUSP
15.0000 [IU] | Freq: Two times a day (BID) | SUBCUTANEOUS | Status: DC
  Filled 2012-06-26: qty 10

## 2012-06-26 MED ORDER — SEVELAMER CARBONATE 800 MG PO TABS
800.0000 mg | ORAL_TABLET | ORAL | Status: DC | PRN
  Filled 2012-06-26: qty 1

## 2012-06-26 MED ORDER — HEPARIN BOLUS VIA INFUSION
2500.0000 [IU] | Freq: Once | INTRAVENOUS | Status: AC
  Administered 2012-06-26: 2500 [IU] via INTRAVENOUS

## 2012-06-26 MED ORDER — DIAZEPAM 5 MG PO TABS
5.0000 mg | ORAL_TABLET | ORAL | Status: DC
  Filled 2012-06-26: qty 1

## 2012-06-26 MED ORDER — SODIUM CHLORIDE 0.9 % IV SOLN: INTRAVENOUS | Status: DC

## 2012-06-26 MED ORDER — ALBUTEROL SULFATE HFA 108 (90 BASE) MCG/ACT IN AERS: 2.0000 | INHALATION_SPRAY | Freq: Four times a day (QID) | RESPIRATORY_TRACT | Status: DC | PRN

## 2012-06-26 MED ORDER — NITROGLYCERIN 2 % TD OINT
0.5000 [in_us] | TOPICAL_OINTMENT | Freq: Once | TRANSDERMAL | Status: DC
  Filled 2012-06-26: qty 30

## 2012-06-26 MED ORDER — LISINOPRIL 20 MG PO TABS
20.0000 mg | ORAL_TABLET | Freq: Every day | ORAL | Status: DC
  Administered 2012-06-26: 20 mg via ORAL
  Filled 2012-06-26 (×2): qty 1

## 2012-06-26 MED ORDER — ASPIRIN 81 MG PO TABS: 81.0000 mg | ORAL_TABLET | Freq: Every day | ORAL | Status: DC

## 2012-06-26 MED ORDER — FENTANYL CITRATE 0.05 MG/ML IJ SOLN
25.0000 ug | Freq: Once | INTRAMUSCULAR | Status: AC
  Administered 2012-06-26: 25 ug via INTRAVENOUS
  Filled 2012-06-26: qty 2

## 2012-06-26 NOTE — ED Notes (Signed)
Pt continues to c/o 6/10 CP, bed control aware and states will increase level of care to ICU, pt updated on plan of care.

## 2012-06-26 NOTE — ED Provider Notes (Signed)
History     CSN: [id number]  Arrival date & time 06/26/12  1335   First MD Initiated Contact with Patient 06/26/12 1337      Chief Complaint  Patient presents with  . Chest Pain    (Consider location/radiation/quality/duration/timing/severity/associated sxs/prior treatment) HPI Comments: Patient with a history of ESRD, HTN, Hyperlipidemia, and DM presents with a chief complaint of intermittent chest pain for the past 2 weeks.  She reports that she typically notices the pain when she is up doing something around the house, but pain has also occurred at rest.  Pain typically last 10-15 minutes and then resolves.  Pain located in the substernal area and radiates down the left arm.  She denies any chest pain at this time. She denies SOB.  She does have some nausea associated with the pain, but no vomiting.  No dizziness, lightheadedness, or syncope.  Patient had a Dobutamine Stress Test done at [facility] on 06/13/12 as part of her work up for a renal transplant.  I was able to obtain the records from [facility].  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  She has an EF of 50-55%.  She was called and told that the test was abnormal and told to follow up with a Cardiologist at [facility] Dr. Rich Reining.  She reports that she has not yet seen Dr. Rich Reining.  She has an appointment scheduled tomorrow.  She has never seen Dr. Rich Reining previously.  Patient also reports that her mother had a MI at the age of 97.  Patient is on dialysis and has dialysis M, W, and F.  Last dialysis was three days ago.  The history is provided by the patient and medical records.    Past Medical History  Diagnosis Date  . Asthma   . Chronic kidney disease     started dialysis 01/29/11  . Weight loss   . Loss of appetite   . CHF (congestive heart failure)   . Hypertension   . Hyperlipidemia   . Diabetes mellitus age 45  . Peripheral neuropathy   . Iron deficiency anemia    . Hyperparathyroidism, secondary   . Sciatica   . GERD (gastroesophageal reflux disease)   . Adenoma   . Gastroparesis     Past Surgical History  Procedure Laterality Date  . Cesarean section    . Cholecystectomy  1996  . Av fistula placement  08/2010    Left radiocephalic AVF  . Ligation goretex fistula  01/04/11    Left AVF  . Colonoscopy    . Esophagogastroduodenoscopy  10/19/2011    Dr. Silvano Rusk    Family History  Problem Relation Age of Onset  . Heart disease Mother     heart attack at 23 y/o-infected valve  . Kidney disease Mother     was a dialysis patient  . Heart disease Brother   . Kidney disease Maternal Grandmother     pre-dialysis  . Kidney failure Paternal Uncle   . Kidney failure Paternal Aunt   . Diabetes Father   . Hypertension Father   . Colon cancer Neg Hx     History  Substance Use Topics  . Smoking status: Never Smoker   . Smokeless tobacco: Never Used  . Alcohol Use: No    OB History   Grav Para Term Preterm Abortions TAB SAB Ect Mult Living  Review of Systems  Constitutional: Negative for fever and chills.  Respiratory: Negative for shortness of breath.   Cardiovascular: Positive for chest pain. Negative for leg swelling.  Gastrointestinal: Positive for nausea. Negative for vomiting.  Neurological: Negative for dizziness, syncope, light-headedness and numbness.  All other systems reviewed and are negative.    Allergies  Ciprofloxacin  Home Medications   Current Outpatient Rx  Name  Route  Sig  Dispense  Refill  . albuterol (PROVENTIL HFA;VENTOLIN HFA) 108 (90 BASE) MCG/ACT inhaler   Inhalation   Inhale 2 puffs into the lungs every 6 (six) hours as needed. For shortness of breath         . amLODipine (NORVASC) 10 MG tablet   Oral   Take 10 mg by mouth daily.           Marland Kitchen aspirin 81 MG tablet   Oral   Take 81 mg by mouth daily.           Marland Kitchen esomeprazole (NEXIUM) 20 MG capsule   Oral   Take 20  mg by mouth daily before breakfast.         . hydrALAZINE (APRESOLINE) 25 MG tablet   Oral   Take 25 mg by mouth at bedtime.         . Insulin Aspart Prot & Aspart (NOVOLOG MIX 70/30 [place])   Subcutaneous   Inject 15-20 Units into the skin 2 (two) times daily. 15 units every morning and 20 units every evening         . labetalol (NORMODYNE) 200 MG tablet   Oral   Take 200 mg by mouth 2 (two) times daily.          Marland Kitchen linagliptin (TRADJENTA) 5 MG TABS tablet   Oral   Take 5 mg by mouth at bedtime.         Marland Kitchen lisinopril (PRINIVIL,ZESTRIL) 20 MG tablet   Oral   Take 20 mg by mouth at bedtime.         . multivitamin (RENA-VIT) TABS tablet   Oral   Take 1 tablet by mouth daily.         . sevelamer carbonate (RENVELA) 800 MG tablet   Oral   Take 800-1,600 mg by mouth 3 (three) times daily with meals. 1600 mg with meals and 800 mg with snacks         . simvastatin (ZOCOR) 5 MG tablet   Oral   Take 5 mg by mouth at bedtime.         . sorbitol 70 % solution   Oral   Take 30 mLs by mouth 2 (two) times daily as needed.         Marland Kitchen ACCU-CHEK AVIVA PLUS test strip      Use to check blood sugar twice daily         . ACCU-CHEK SOFTCLIX LANCETS lancets      Use to check blood sugar twice daily         . B-D INS SYRINGE 0.5CC/31GX5/16 31G X 5/16" 0.5 ML MISC      Use as directed once daily           BP 131/85  Pulse 91  Temp(Src) 98.1 F (36.7 C) (Oral)  Resp 20  SpO2 100%  LMP 06/16/2012  Physical Exam  Nursing note and vitals reviewed. Constitutional: She appears well-developed and well-nourished. No distress.  HENT:  Head: Normocephalic and atraumatic.  Mouth/Throat: Oropharynx is clear and  moist.  Neck: Normal range of motion. Neck supple.  Cardiovascular: Normal rate, regular rhythm, normal heart sounds and intact distal pulses.   Pulmonary/Chest: Effort normal and breath sounds normal. No respiratory distress. She has no wheezes. She has no  rales.  Abdominal: Soft. Bowel sounds are normal. She exhibits no distension. There is no tenderness. There is no rebound and no guarding.  Musculoskeletal: Normal range of motion.  No LE edema or erythema  Neurological: She is alert.  Skin: Skin is warm and dry. She is not diaphoretic.  Psychiatric: She has a normal mood and affect.    ED Course  Procedures (including critical care time)  Labs Reviewed  CBC  BASIC METABOLIC PANEL  PRO B NATRIURETIC PEPTIDE  POCT I-STAT TROPONIN I   Dg Chest 2 View  06/26/2012  *RADIOLOGY REPORT*  Clinical Data: Chest pain  CHEST - 2 VIEW  Comparison: 06/09/2011  Findings: Heart is still silhouette is within normal limits.  Clear lungs.  No pneumothorax. Tiny pleural effusions are suspected. Minimal bronchitic changes are stable.  IMPRESSION: Minimal bronchitic changes are stable.  Tiny pleural effusions are suspected.  No consolidation.   Original Report Authenticated By: Marybelle Killings, M.D.      No diagnosis found.   Date: 06/26/2012  Rate: 90  Rhythm: normal sinus rhythm  QRS Axis: normal  Intervals: normal  ST/T Wave abnormalities: normal  Conduction Disutrbances:none  Narrative Interpretation:   Old EKG Reviewed: none available  3:10 PM Discussed with [facility] who has agreed to evaluate the patient.    MDM  Patient with a complicated medical history including ESRD, DM, HTN, and Hyperlipidemia presents today with intermittent chest pain over the past 2 weeks.  EKG unremarkable.  Initial troponin negative.  No acute findings on CXR.  Patient had a recent positive Dobutamine Stress test done at [facility] on 06/13/12.  Due to risk factors and ongoing chest pain with recent positive stress test Cardiology consulted for evaluation.  Cardiology has agreed to admit the patient.          Sherlyn Lees [place], PA-C 06/27/12 1420

## 2012-06-26 NOTE — ED Notes (Signed)
Per ems: pt called for CP intermittent x 2 weeks. [facility] on [facility], dialysis MD gave the pt rx for nitro after that but has not been able to fill the prescription. Pt is due for dialysis today and last full treatment was Friday. Pt describes pain as sharp 9/10 radiating into L arm with nausea. NSR en route. PIV started in R arm, L arm do not stick

## 2012-06-26 NOTE — Consult Note (Signed)
ANTICOAGULATION CONSULT NOTE - Initial Consult  Pharmacy Consult for Heparin Indication: chest pain/ACS  Allergies  Allergen Reactions  . Ciprofloxacin Itching and Nausea And Vomiting    Patient Measurements: Height: 5' 6.14" (168 cm) Weight: 208 lb 15.9 oz (94.8 kg) IBW/kg (Calculated) : 59.63 Heparin Dosing Weight: ~80kg  Vital Signs: Temp: 98.1 F (36.7 C) (03/24 1345) Temp src: Oral (03/24 1345) BP: 137/83 mmHg (03/24 1632) Pulse Rate: 89 (03/24 1530)  Labs:  Recent Labs  06/26/12 1342  HGB 12.0  HCT 35.4*  PLT PLATELET CLUMPS NOTED ON SMEAR, UNABLE TO ESTIMATE  CREATININE 6.84*    Estimated Creatinine Clearance: 13.2 ml/min (by C-G formula based on Cr of 6.84).   Medical History: Past Medical History  Diagnosis Date  . Asthma   . Chronic kidney disease     started dialysis 01/29/11  . Weight loss   . Loss of appetite   . CHF (congestive heart failure)   . Hypertension   . Hyperlipidemia   . Diabetes mellitus age 21  . Peripheral neuropathy   . Iron deficiency anemia   . Hyperparathyroidism, secondary   . Sciatica   . GERD (gastroesophageal reflux disease)   . Adenoma   . Gastroparesis     Medications:  No anticoagulants pta  Assessment: 36yof with history of HTN, HLD, DM and ESRD on HD presents to the ED with CP. She will begin heparin with plan for cath. CBC wnl except platelets are clumped.  Goal of Therapy:  Heparin level 0.3-0.7 units/ml Monitor platelets by anticoagulation protocol: Yes   Plan:  1) Heparin bolus 2500 units x 1 2) Heparin gtt @ 1000 units/hr 3) 8 hour heparin level 4) Daily heparin level and CBC  Deboraha Sprang 06/26/2012,6:13 PM

## 2012-06-26 NOTE — ED Notes (Signed)
Cardiologist at bedside for assessment. Per Cardiologist pt does not require ICU level of care at this time.

## 2012-06-26 NOTE — ED Notes (Signed)
IV team contacted to start a new line on patient d/t other line not threading completely in.

## 2012-06-26 NOTE — ED Notes (Signed)
IV team at the bedside. 

## 2012-06-26 NOTE — ED Notes (Signed)
PA student at the bedside.  

## 2012-06-26 NOTE — H&P (Signed)
Patient ID: KHILEY CARDULLO MRN: [id number] DOB/AGE: 11-11-1975 37 y.o. Admit date: 06/26/2012  Primary Care Physician:BLAND,VEITA J, MD Primary Cardiologist: Dr Mercy Moore (nephrology) Active Problems:   Unstable angina pectoris  HPI: 37 year old woman with long-standing diabetes, hypertension, and dyslipidemia, presented to the emergency department with substernal chest pain. She's had chest discomfort now on and off for about 2 weeks. This is occurred both at rest and with exertion. She describes a pressure like sensation in the chest radiating down both arms. She complains of associated dyspnea. She denies palpitations, orthopnea, or PND. The patient has end-stage renal disease and is on hemodialysis. Her chest pain has not been associated with dialysis. Her last dialysis session was Friday. She called the dialysis center this morning and was told to come to the emergency department because of her chest pain.  She is currently undergoing evaluation for kidney transplant. This is being done at [facility]. She had a dobutamine stress echocardiogram as part of this evaluation on 06/13/2012. This showed nondiagnostic ST segment depression with dobutamine, but she did have inducible wall motion abnormalities. This demonstrated a moderate sized apical, anteroseptal, and anterior wall motion abnormality with stress.  The patient has a strong family history of coronary artery disease. Her mother died of a myocardial infarction at age 75.   Past Medical History  Diagnosis Date  . Asthma   . Chronic kidney disease     started dialysis 01/29/11  . Weight loss   . Loss of appetite   . CHF (congestive heart failure)   . Hypertension   . Hyperlipidemia   . Diabetes mellitus age 67  . Peripheral neuropathy   . Iron deficiency anemia   . Hyperparathyroidism, secondary   . Sciatica   . GERD (gastroesophageal reflux disease)   . Adenoma   . Gastroparesis     Past  Surgical History  Procedure Laterality Date  . Cesarean section    . Cholecystectomy  1996  . Av fistula placement  08/2010    Left radiocephalic AVF  . Ligation goretex fistula  01/04/11    Left AVF  . Colonoscopy    . Esophagogastroduodenoscopy  10/19/2011    Dr. Silvano Rusk    Family History  Problem Relation Age of Onset  . Heart disease Mother     heart attack at 13 y/o-infected valve  . Kidney disease Mother     was a dialysis patient  . Heart disease Brother   . Kidney disease Maternal Grandmother     pre-dialysis  . Kidney failure Paternal Uncle   . Kidney failure Paternal Aunt   . Diabetes Father   . Hypertension Father   . Colon cancer Neg Hx     History   Social History  . Marital Status: Single    Spouse Name: N/A    Number of Children: 2  . Years of Education: N/A   Occupational History  . Unemployed    Social History Main Topics  . Smoking status: Never Smoker   . Smokeless tobacco: Never Used  . Alcohol Use: No  . Drug Use: No  . Sexually Active: Yes     Comment: BTL   Other Topics Concern  . Not on file   Social History Narrative   Disabled 2/2 to dialysis   Used to be a Consulting civil engineer at [employer]   Has 2 kids   Lives in [place]   No caffeine     Review  of systems: General: no fevers/chills/night sweats/weight loss Eyes: no blurry vision, diplopia, or amaurosis ENT: no sore throat or hearing loss Resp: no cough, wheezing, or hemoptysis CV: no edema or palpitations GI: no abdominal pain, nausea, vomiting, diarrhea, or constipation GU: no dysuria, frequency, or hematuria Skin: no rash Neuro: no headache, numbness, tingling, or weakness of extremities Musculoskeletal: no joint pain or swelling Heme: no bleeding, DVT, or easy bruising Endo: no polydipsia or polyuria  Physical Exam: Blood pressure 137/83, pulse 89, temperature 98.1 F (36.7 C), temperature source Oral, resp. rate 20, last menstrual period 06/16/2012, SpO2  99.00%.    Pt is alert and oriented, WD, WN, pleasant woman in no distress. HEENT: normal Neck: JVP normal. Carotid upstrokes normal without bruits. No thyromegaly. Lungs: equal expansion, clear bilaterally CV: Apex is discrete and nondisplaced, RRR without murmur or gallop Abd: soft, NT, +BS, no bruit, no hepatosplenomegaly Back: no CVA tenderness Ext: no C/C/E        Left forearm AV fistula within normal limits        Femoral pulses 2+=         DP/PT pulses intact and = Skin: warm and dry without rash Neuro: CNII-XII intact             Strength intact = bilaterally  Labs:   Lab Results  Component Value Date   WBC 4.8 06/26/2012   HGB 12.0 06/26/2012   HCT 35.4* 06/26/2012   MCV 85.1 06/26/2012   PLT PLATELET CLUMPS NOTED ON SMEAR, UNABLE TO ESTIMATE 06/26/2012    Recent Labs Lab 06/26/12 1342  NA 139  K 5.1  CL 103  CO2 23  BUN 46*  CREATININE 6.84*  CALCIUM 9.0  GLUCOSE 78   Lab Results  Component Value Date   CKTOTAL 72 06/10/2011   CKMB 0.8 06/10/2011   TROPONINI <0.30 06/10/2011    Lab Results  Component Value Date   CHOL 232* 10/19/2010   CHOL  Value: 245        ATP III CLASSIFICATION:  <200     mg/dL   Desirable  200-239  mg/dL   Borderline High  >=240    mg/dL   High       * 12/09/2008   CHOL 245 12/09/2008   Lab Results  Component Value Date   HDL 53 10/19/2010   HDL 45 12/09/2008   HDL 45 12/09/2008   Lab Results  Component Value Date   LDLCALC 166* 10/19/2010   LDLCALC  Value: 171        Total Cholesterol/HDL:CHD Risk Coronary Heart Disease Risk Table                     Men   Women  1/2 Average Risk   3.4   3.3  Average Risk       5.0   4.4  2 X Average Risk   9.6   7.1  3 X Average Risk  23.4   11.0        Use the calculated Patient Ratio above and the CHD Risk Table to determine the patient's CHD Risk.        ATP III CLASSIFICATION (LDL):  <100     mg/dL   Optimal  100-129  mg/dL   Near or Above                    Optimal  130-159  mg/dL   Borderline  160-189  mg/dL   High  >190     mg/dL   Very High* 12/09/2008   LDLCALC 171 12/09/2008   Lab Results  Component Value Date   TRIG 66 10/19/2010   TRIG 143 12/09/2008   TRIG 67.0 08/08/2008   Lab Results  Component Value Date   CHOLHDL 4.4 10/19/2010   CHOLHDL 5.4 12/09/2008   CHOLHDL 5 08/08/2008   Lab Results  Component Value Date   LDLDIRECT 170.4 08/08/2008      Radiology: Dg Chest 2 View  06/26/2012  *RADIOLOGY REPORT*  Clinical Data: Chest pain  CHEST - 2 VIEW  Comparison: 06/09/2011  Findings: Heart is still silhouette is within normal limits.  Clear lungs.  No pneumothorax. Tiny pleural effusions are suspected. Minimal bronchitic changes are stable.  IMPRESSION: Minimal bronchitic changes are stable.  Tiny pleural effusions are suspected.  No consolidation.   Original Report Authenticated By: Marybelle Killings, M.D.    EKG: Normal sinus rhythm 90 beats per minute,  within normal limits  ASSESSMENT AND PLAN:  1. Unstable angina pectoris. The patient has typical symptoms of unstable angina. Her initial point-of-care troponin is negative and EKG is nondiagnostic. However, considering her very high risk profile with diabetes, end-stage renal disease, strong premature family history of CAD, hypertension, dyslipidemia, it is clear that she will will require cardiac catheterization and possible PCI. She had a recent dobutamine stress echo that was positive for least a moderate area of inducible ischemia involving the anteroapical walls. I have reviewed the risks, indications, and alternatives to cardiac catheterization and PCI. The patient understands and agrees to proceed. Specific risks include vascular injury, bleeding, stroke, myocardial infarction, emergency CABG, and death. She understands these risks occurred less than 1% of the time. I would recommend a right femoral approach as she is a hemodialysis patient.  2. Insulin requiring diabetes. Her home regimen will be continued and she will be placed on sliding  scale insulin as well.  3. Hypertension. Continue on antihypertensive regimen.   4. End-Stage renal disease. I spoke with Dr. Jimmy Footman. The renal team will follow her during her hospitalization. She will need hemodialysis tomorrow.  Signed: Sherren Mocha 06/26/2012, 6:02 PM

## 2012-06-26 NOTE — Progress Notes (Signed)
Called due to 6/10 chest pain prior to transfer to step-down bed. Evaluated patient- pain has been continuous all day and relieved only with IV pain medications. Reports 6/10 chest pain but is sitting comfortably. Encouragingly, no ischemic changes on EKG or + biomarkers or hemodynamic instability despite the continuous chest pain.  BP 130s/90s. P 80-90s. On heparin. Will tx with metoprolol, nitropaste, and IV fentanyl (ESRD) but otherwise she is appropriate for step down and cath in the AM.

## 2012-06-27 ENCOUNTER — Encounter (HOSPITAL_COMMUNITY)
Admission: EM | Disposition: A | Discharge status: Home / Self Care | Payer: Self-pay | Source: Home / Self Care | Attending: Cardiovascular Disease

## 2012-06-27 DIAGNOSIS — I2510 Atherosclerotic heart disease of native coronary artery without angina pectoris: Secondary | ICD-10-CM

## 2012-06-27 HISTORY — PX: LEFT HEART CATHETERIZATION WITH CORONARY ANGIOGRAM: SHX5451

## 2012-06-27 LAB — GLUCOSE, CAPILLARY: Glucose-Capillary: 109 mg/dL — ABNORMAL HIGH (ref 70–99)

## 2012-06-27 LAB — CBC
HCT: 33.6 % — ABNORMAL LOW (ref 36.0–46.0)
Hemoglobin: 11 g/dL — ABNORMAL LOW (ref 12.0–15.0)
MCH: 28.5 pg (ref 26.0–34.0)
MCHC: 32.7 g/dL (ref 30.0–36.0)
RBC: 3.86 MIL/uL — ABNORMAL LOW (ref 3.87–5.11)

## 2012-06-27 LAB — BASIC METABOLIC PANEL
CO2: 21 mEq/L (ref 19–32)
Calcium: 8.5 mg/dL (ref 8.4–10.5)
Creatinine, Ser: 7.05 mg/dL — ABNORMAL HIGH (ref 0.50–1.10)
GFR calc non Af Amer: 7 mL/min — ABNORMAL LOW (ref >90)
Glucose, Bld: 182 mg/dL — ABNORMAL HIGH (ref 70–99)
Sodium: 139 mEq/L (ref 135–145)

## 2012-06-27 LAB — TROPONIN I
Troponin I: <0.3 ng/mL (ref <0.30)
Troponin I: <0.3 ng/mL (ref <0.30)

## 2012-06-27 LAB — LIPID PANEL
HDL: 49 mg/dL (ref >39)
LDL Cholesterol: 54 mg/dL (ref 0–99)
Total CHOL/HDL Ratio: 2.4 RATIO

## 2012-06-27 LAB — HEPARIN LEVEL (UNFRACTIONATED): Heparin Unfractionated: 0.26 IU/mL — ABNORMAL LOW (ref 0.30–0.70)

## 2012-06-27 SURGERY — LEFT HEART CATHETERIZATION WITH CORONARY ANGIOGRAM
Anesthesia: LOCAL

## 2012-06-27 MED ORDER — HEPARIN (PORCINE) IN NACL 2-0.9 UNIT/ML-% IJ SOLN
INTRAMUSCULAR | Status: AC
  Filled 2012-06-27: qty 1000

## 2012-06-27 MED ORDER — DIPHENHYDRAMINE HCL 50 MG/ML IJ SOLN
INTRAMUSCULAR | Status: AC
  Filled 2012-06-27: qty 1

## 2012-06-27 MED ORDER — SODIUM CHLORIDE 0.9 % IJ SOLN: 3.0000 mL | INTRAMUSCULAR | Status: DC | PRN

## 2012-06-27 MED ORDER — ASPIRIN 81 MG PO CHEW
81.0000 mg | CHEWABLE_TABLET | Freq: Every day | ORAL | Status: DC
  Administered 2012-06-28: 81 mg via ORAL
  Filled 2012-06-27: qty 1

## 2012-06-27 MED ORDER — FENTANYL CITRATE 0.05 MG/ML IJ SOLN
INTRAMUSCULAR | Status: AC
  Filled 2012-06-27: qty 2

## 2012-06-27 MED ORDER — ONDANSETRON HCL 4 MG/2ML IJ SOLN
4.0000 mg | Freq: Four times a day (QID) | INTRAMUSCULAR | Status: DC | PRN
  Administered 2012-06-28 (×2): 4 mg via INTRAVENOUS
  Filled 2012-06-27 (×2): qty 2

## 2012-06-27 MED ORDER — FENTANYL CITRATE 0.05 MG/ML IJ SOLN
INTRAMUSCULAR | Status: AC
  Administered 2012-06-27: 25 ug via INTRAVENOUS
  Filled 2012-06-27: qty 2

## 2012-06-27 MED ORDER — SODIUM CHLORIDE 0.9 % IV SOLN: 250.0000 mL | INTRAVENOUS | Status: DC | PRN

## 2012-06-27 MED ORDER — SODIUM CHLORIDE 0.9 % IJ SOLN: 3.0000 mL | Freq: Two times a day (BID) | INTRAMUSCULAR | Status: DC

## 2012-06-27 MED ORDER — OXYCODONE-ACETAMINOPHEN 5-325 MG PO TABS
1.0000 | ORAL_TABLET | Freq: Once | ORAL | Status: AC
  Administered 2012-06-28: 1 via ORAL
  Filled 2012-06-27: qty 1

## 2012-06-27 MED ORDER — MIDAZOLAM HCL 2 MG/2ML IJ SOLN
INTRAMUSCULAR | Status: AC
  Filled 2012-06-27: qty 2

## 2012-06-27 MED ORDER — NITROGLYCERIN 2 % TD OINT
1.0000 [in_us] | TOPICAL_OINTMENT | Freq: Four times a day (QID) | TRANSDERMAL | Status: DC
  Administered 2012-06-27 – 2012-06-28 (×5): 1 [in_us] via TOPICAL
  Filled 2012-06-27: qty 30

## 2012-06-27 MED ORDER — ACETAMINOPHEN 325 MG PO TABS
650.0000 mg | ORAL_TABLET | ORAL | Status: DC | PRN
  Administered 2012-06-27: 650 mg via ORAL
  Filled 2012-06-27: qty 2

## 2012-06-27 MED ORDER — HEPARIN SODIUM (PORCINE) 5000 UNIT/ML IJ SOLN
5000.0000 [IU] | Freq: Three times a day (TID) | INTRAMUSCULAR | Status: DC
  Administered 2012-06-27 – 2012-06-28 (×3): 5000 [IU] via SUBCUTANEOUS
  Filled 2012-06-27 (×5): qty 1

## 2012-06-27 MED ORDER — LIDOCAINE HCL (PF) 1 % IJ SOLN
INTRAMUSCULAR | Status: AC
  Filled 2012-06-27: qty 30

## 2012-06-27 MED ORDER — SEVELAMER CARBONATE 800 MG PO TABS
1600.0000 mg | ORAL_TABLET | Freq: Three times a day (TID) | ORAL | Status: DC
  Administered 2012-06-28 (×3): 1600 mg via ORAL
  Filled 2012-06-27 (×5): qty 2

## 2012-06-27 MED ORDER — FENTANYL CITRATE 0.05 MG/ML IJ SOLN: 25.0000 ug | Freq: Once | INTRAMUSCULAR | Status: AC

## 2012-06-27 NOTE — Progress Notes (Signed)
Plan of care discussed with patient and family.  They do not want late testing and late discharge; therefore, I have rescheduled VQ for AM and patient will stay the nite.  Rosaria Ferries, PA aware.

## 2012-06-27 NOTE — Procedures (Signed)
I was present at this dialysis session. I have reviewed the session itself and made appropriate changes.   Kelly Splinter, MD [employer] 06/27/2012, 5:21 PM

## 2012-06-27 NOTE — CV Procedure (Signed)
   Cardiac Catheterization Procedure Note  Name: Linda Ellison MRN: [id number] DOB: 01-Dec-1975  Procedure: Left Heart Cath, Selective Coronary Angiography, LV angiography  Indication: Chest pain, abnormal stress echo.    Procedural details: The right groin was prepped, draped, and anesthetized with 1% lidocaine. Using modified Seldinger technique, a 5 French sheath was introduced into the right femoral artery. MP catheter and JL4 catheter were used. Catheter exchanges were performed over a guidewire. There were no immediate procedural complications. The patient was transferred to the post catheterization recovery area for further monitoring.  Procedural Findings: Hemodynamics:  AO 112/79 LV 103/11   Coronary angiography: Coronary dominance: right  Left mainstem: No significant disease.   Left anterior descending (LAD): Mild luminal irregularities.   Left circumflex (LCx): No significant disease.   Right coronary artery (RCA): Moderate diffuse disease distal RCA.   Left ventriculography: Left ventricular systolic function is normal, LVEF is estimated at 60-65%, no regional wall motion abnormalities in the RAO projection.   Final Conclusions:  No obstructive CAD.  She has ongoing very mild chest pain (1/10), worse with deep breathing.  Will get V/Q scan.  If this is not suggestive of PE, she can go home today.   Loralie Champagne 06/27/2012, 12:45 PM

## 2012-06-27 NOTE — Care Management Note (Signed)
    Page 1 of 1   06/27/2012     12:25:57 PM   CARE MANAGEMENT NOTE 06/27/2012  Patient:  Linda Ellison, Linda Ellison   Account Number:  [id number]  Date Initiated:  06/27/2012  Documentation initiated by:  Elissa Hefty  Subjective/Objective Assessment:   adm w angina     Action/Plan:   lives w fam, pcp dr Myra Rude bland   Anticipated DC Date:     Anticipated DC Plan:  [facility]  CM consult      Choice offered to / List presented to:             Status of service:   Medicare Important Message given?   (If response is "NO", the following Medicare IM given date fields will be blank) Date Medicare IM given:   Date Additional Medicare IM given:    Discharge Disposition:    Per UR Regulation:  Reviewed for med. necessity/level of care/duration of stay  If discussed at [place] of Stay Meetings, dates discussed:    Comments:

## 2012-06-27 NOTE — Progress Notes (Addendum)
Patient ID: Linda Ellison, female   DOB: 12-17-1975, 37 y.o.   MRN: [id number]    SUBJECTIVE: Ongoing low level chest pain, better than yesterday.   Marland Kitchen amLODipine  10 mg Oral Daily  . aspirin  324 mg Oral NOW   Or  . aspirin  300 mg Rectal NOW  . aspirin  324 mg Oral Pre-Cath  . aspirin EC  81 mg Oral Daily  . diazepam  5 mg Oral On Call  . hydrALAZINE  25 mg Oral QHS  . insulin aspart protamine-insulin aspart  15 Units Subcutaneous BID WC  . labetalol  200 mg Oral BID  . linagliptin  5 mg Oral QHS  . lisinopril  20 mg Oral QHS  . multivitamin  1 tablet Oral QHS  . nitroGLYCERIN  0.5 inch Topical Once  . pantoprazole  40 mg Oral Daily  . sevelamer carbonate  1,600 mg Oral TID WC  . simvastatin  5 mg Oral QHS  . sodium chloride  3 mL Intravenous Q12H  . sodium chloride  3 mL Intravenous Q12H      Filed Vitals:   06/27/12 0400 06/27/12 0404 06/27/12 0500 06/27/12 0530  BP: 100/66  87/54 99/67  Pulse:   88   Temp:  98.4 F (36.9 C)    TempSrc:  Oral    Resp: 13  14   Height:      Weight:   206 lb 9.1 oz (93.7 kg)   SpO2:   98%     Intake/Output Summary (Last 24 hours) at 06/27/12 0723 Last data filed at 06/27/12 0500  Gross per 24 hour  Intake    272 ml  Output    750 ml  Net   -478 ml    LABS: Basic Metabolic Panel:  Recent Labs  06/26/12 1342 06/27/12 0340  NA 139 139  K 5.1 4.8  CL 103 105  CO2 23 21  GLUCOSE 78 182*  BUN 46* 48*  CREATININE 6.84* 7.05*  CALCIUM 9.0 8.5   Liver Function Tests: No results found for this basename: AST, ALT, ALKPHOS, BILITOT, PROT, ALBUMIN,  in the last 72 hours No results found for this basename: LIPASE, AMYLASE,  in the last 72 hours CBC:  Recent Labs  06/26/12 1342 06/27/12 0340  WBC 4.8 7.3  HGB 12.0 11.0*  HCT 35.4* 33.6*  MCV 85.1 87.0  PLT PLATELET CLUMPS NOTED ON SMEAR, UNABLE TO ESTIMATE 214   Cardiac Enzymes:  Recent Labs  06/26/12 2152 06/27/12 0307  TROPONINI <0.30 <0.30   BNP: No  components found with this basename: POCBNP,  D-Dimer: No results found for this basename: DDIMER,  in the last 72 hours Hemoglobin A1C: No results found for this basename: HGBA1C,  in the last 72 hours Fasting Lipid Panel:  Recent Labs  06/27/12 0340  CHOL 120  HDL 49  LDLCALC 54  TRIG 85  CHOLHDL 2.4   Thyroid Function Tests: No results found for this basename: TSH, T4TOTAL, FREET3, T3FREE, THYROIDAB,  in the last 72 hours Anemia Panel: No results found for this basename: VITAMINB12, FOLATE, FERRITIN, TIBC, IRON, RETICCTPCT,  in the last 72 hours  RADIOLOGY: Dg Chest 2 View  06/26/2012  *RADIOLOGY REPORT*  Clinical Data: Chest pain  CHEST - 2 VIEW  Comparison: 06/09/2011  Findings: Heart is still silhouette is within normal limits.  Clear lungs.  No pneumothorax. Tiny pleural effusions are suspected. Minimal bronchitic changes are stable.  IMPRESSION: Minimal bronchitic changes are  stable.  Tiny pleural effusions are suspected.  No consolidation.   Original Report Authenticated By: Marybelle Killings, M.D.     PHYSICAL EXAM General: NAD Neck: No JVD, no thyromegaly or thyroid nodule.  Lungs: Clear to auscultation bilaterally with normal respiratory effort. CV: Nondisplaced PMI.  Heart regular S1/S2, no S3/S4, no murmur.  No peripheral edema.  No carotid bruit.  Normal pedal pulses.  Abdomen: Soft, nontender, no hepatosplenomegaly, no distention.  Neurologic: Alert and oriented x 3.  Psych: Normal affect. Extremities: No clubbing or cyanosis.   TELEMETRY: Reviewed telemetry pt in NSR  ASSESSMENT AND PLAN: 37 yo with history of ESRD and recent abnormal stress echo as part of renal transplant workup presented with chest pain.  Chest pain has been persistent. ECG is normal.  Cardiac enzymes, reassuringly, remain normal.   - LHC today given abnormal stress echo and ongoing chest pain. - Will need HD today.   Loralie Champagne 06/27/2012 7:33 AM

## 2012-06-27 NOTE — Consult Note (Signed)
[facility] for Heparin Indication: chest pain/ACS  Allergies  Allergen Reactions  . Ciprofloxacin Itching and Nausea And Vomiting    Patient Measurements: Height: 5' 6.14" (168 cm) Weight: 205 lb 7.5 oz (93.2 kg) IBW/kg (Calculated) : 59.63 Heparin Dosing Weight: ~80kg  Vital Signs: Temp: 98.4 F (36.9 C) (03/25 0404) Temp src: Oral (03/25 0404) BP: 100/66 mmHg (03/25 0400) Pulse Rate: 92 (03/25 0300)  Labs:  Recent Labs  06/26/12 1342 06/26/12 2152 06/27/12 0306 06/27/12 0340  HGB 12.0  --   --  11.0*  HCT 35.4*  --   --  33.6*  PLT PLATELET CLUMPS NOTED ON SMEAR, UNABLE TO ESTIMATE  --   --  214  LABPROT  --  13.6  --   --   INR  --  1.05  --   --   HEPARINUNFRC  --   --  0.26*  --   CREATININE 6.84*  --   --   --   TROPONINI  --  <0.30  --   --     Estimated Creatinine Clearance: 13.1 ml/min (by C-G formula based on Cr of 6.84).  Assessment: 37 yo female with chest pain for heparin  Goal of Therapy:  Heparin level 0.3-0.7 units/ml Monitor platelets by anticoagulation protocol: Yes   Plan:  Increase Heparin 1200 units/hr F/U after cath  Caryl Pina 06/27/2012,4:36 AM

## 2012-06-27 NOTE — ED Provider Notes (Signed)
Medical screening examination/treatment/procedure(s) were conducted as a shared visit with non-physician practitioner(s) and myself.  I personally evaluated the patient during the encounter Pt c/o intermittent ant cp for past 1-2 weeks. Reports pos stress test at [facility]. Recurrent cp today, requests card care here at [facility] Card on call paged. Will see/admit.   Mirna Mires, MD 06/27/12 1550

## 2012-06-27 NOTE — Progress Notes (Signed)
[facility] Renal Consultation Note    Indication for Consultation:  Management of ESRD/hemodialysis; anemia, hypertension/volume and secondary hyperparathyroidism  HPI: Linda Ellison is a 37 y.o. female ESRD patient (MWF HD) with a history of HTN, dyslipidemia and longstanding DM who presents with a 2-3 week history of substernal chest pain that occurs with exertion and at rest.  The pain began intermittently but now persists and is accompanied by nausea. She is currently undergoing renal transplant evaluation at [facility] and was found to have an abnormal stress test on 06/13/12.  This showed "nondiagnostic ST segment depression with dobutamine" and "inducible wall motion abnormalities." Her family history is significant for CAD; her mother died of MI at age 90. She is scheduled for left heart cath today and possible stents.  Last hemodialysis session was Friday, 06/24/12.   Past Medical History  Diagnosis Date  . Asthma   . Chronic kidney disease     started dialysis 01/29/11  . Weight loss   . Loss of appetite   . CHF (congestive heart failure)   . Hypertension   . Hyperlipidemia   . Diabetes mellitus age 21  . Peripheral neuropathy   . Iron deficiency anemia   . Hyperparathyroidism, secondary   . Sciatica   . GERD (gastroesophageal reflux disease)   . Adenoma   . Gastroparesis    Past Surgical History  Procedure Laterality Date  . Cesarean section    . Cholecystectomy  1996  . Av fistula placement  08/2010    Left radiocephalic AVF  . Ligation goretex fistula  01/04/11    Left AVF  . Colonoscopy    . Esophagogastroduodenoscopy  10/19/2011    Dr. Silvano Rusk   Family History  Problem Relation Age of Onset  . Heart disease Mother     heart attack at 91 y/o-infected valve  . Kidney disease Mother     was a dialysis patient  . Heart disease Brother   . Kidney disease Maternal Grandmother     pre-dialysis  . Kidney failure Paternal Uncle   . Kidney  failure Paternal Aunt   . Diabetes Father   . Hypertension Father   . Colon cancer Neg Hx    Social History:  reports that she has never smoked. She has never used smokeless tobacco. She reports that she does not drink alcohol or use illicit drugs. Allergies  Allergen Reactions  . Ciprofloxacin Itching and Nausea And Vomiting   Prior to Admission medications   Medication Sig Start Date End Date Taking? Authorizing Provider  albuterol (PROVENTIL HFA;VENTOLIN HFA) 108 (90 BASE) MCG/ACT inhaler Inhale 2 puffs into the lungs every 6 (six) hours as needed. For shortness of breath   Yes Historical Provider, MD  amLODipine (NORVASC) 10 MG tablet Take 10 mg by mouth daily.     Yes Historical Provider, MD  aspirin 81 MG tablet Take 81 mg by mouth daily.     Yes Historical Provider, MD  esomeprazole (NEXIUM) 20 MG capsule Take 20 mg by mouth daily before breakfast.   Yes Historical Provider, MD  hydrALAZINE (APRESOLINE) 25 MG tablet Take 25 mg by mouth at bedtime.   Yes Historical Provider, MD  Insulin Aspart Prot & Aspart (NOVOLOG MIX 70/30 ) Inject 15-20 Units into the skin 2 (two) times daily. 15 units every morning and 20 units every evening   Yes Historical Provider, MD  labetalol (NORMODYNE) 200 MG tablet Take 200 mg by mouth 2 (two) times daily.  Yes Historical Provider, MD  linagliptin (TRADJENTA) 5 MG TABS tablet Take 5 mg by mouth at bedtime.   Yes Historical Provider, MD  lisinopril (PRINIVIL,ZESTRIL) 20 MG tablet Take 20 mg by mouth at bedtime.   Yes Historical Provider, MD  multivitamin (RENA-VIT) TABS tablet Take 1 tablet by mouth daily.   Yes Historical Provider, MD  sevelamer carbonate (RENVELA) 800 MG tablet Take 800-1,600 mg by mouth 3 (three) times daily with meals. 1600 mg with meals and 800 mg with snacks   Yes Historical Provider, MD  simvastatin (ZOCOR) 5 MG tablet Take 5 mg by mouth at bedtime.   Yes Historical Provider, MD  sorbitol 70 % solution Take 30 mLs by mouth 2  (two) times daily as needed.   Yes Historical Provider, MD  ACCU-CHEK AVIVA PLUS test strip Use to check blood sugar twice daily 06/22/11   Historical Provider, MD  ACCU-CHEK SOFTCLIX LANCETS lancets Use to check blood sugar twice daily 06/22/11   Historical Provider, MD  B-D INS SYRINGE 0.5CC/31GX5/16 31G X 5/16" 0.5 ML MISC Use as directed once daily 06/22/11   Historical Provider, MD   Current Facility-Administered Medications  Medication Dose Route Frequency Provider Last Rate Last Dose  . 0.9 %  sodium chloride infusion  250 mL Intravenous PRN Sherren Mocha, MD      . 0.9 %  sodium chloride infusion  250 mL Intravenous PRN Sherren Mocha, MD      . 0.9 %  sodium chloride infusion   Intravenous Continuous Sherren Mocha, MD      . acetaminophen (TYLENOL) tablet 650 mg  650 mg Oral Q4H PRN Sherren Mocha, MD      . albuterol (PROVENTIL HFA;VENTOLIN HFA) 108 (90 BASE) MCG/ACT inhaler 2 puff  2 puff Inhalation Q6H PRN Sherren Mocha, MD      . amLODipine (NORVASC) tablet 10 mg  10 mg Oral Daily Sherren Mocha, MD   10 mg at 06/26/12 2250  . aspirin EC tablet 81 mg  81 mg Oral Daily Sherren Mocha, MD      . diazepam (VALIUM) tablet 5 mg  5 mg Oral On Call Sherren Mocha, MD      . heparin ADULT infusion 100 units/mL (25000 units/250 mL)  1,200 Units/hr Intravenous Continuous Sherren Mocha, MD 12 mL/hr at 06/27/12 0800 1,200 Units/hr at 06/27/12 0800  . hydrALAZINE (APRESOLINE) tablet 25 mg  25 mg Oral QHS Sherren Mocha, MD   25 mg at 06/26/12 2250  . insulin aspart protamine-insulin aspart (NOVOLOG 70/30) injection 15 Units  15 Units Subcutaneous BID WC Sherren Mocha, MD      . labetalol (NORMODYNE) tablet 200 mg  200 mg Oral BID Sherren Mocha, MD   200 mg at 06/26/12 2250  . linagliptin (TRADJENTA) tablet 5 mg  5 mg Oral QHS Sherren Mocha, MD      . lisinopril (PRINIVIL,ZESTRIL) tablet 20 mg  20 mg Oral QHS Sherren Mocha, MD   20 mg at 06/26/12 2250  . metoprolol (LOPRESSOR) injection 5 mg   5 mg Intravenous Q5 min PRN Cletus Gash, MD      . multivitamin (RENA-VIT) tablet 1 tablet  1 tablet Oral QHS Sherren Mocha, MD      . nitroGLYCERIN (NITROGLYN) 2 % ointment 0.5 inch  0.5 inch Topical Once Cletus Gash, MD      . nitroGLYCERIN (NITROGLYN) 2 % ointment 1 inch  1 inch Topical Q6H Larey Dresser, MD   1 inch at 06/27/12 [phone]  .  nitroGLYCERIN (NITROSTAT) SL tablet 0.4 mg  0.4 mg Sublingual Q5 Min x 3 PRN Sherren Mocha, MD      . ondansetron [facility]) injection 4 mg  4 mg Intravenous Q6H PRN Sherren Mocha, MD      . pantoprazole (PROTONIX) EC tablet 40 mg  40 mg Oral Daily Sherren Mocha, MD   40 mg at 06/26/12 2254  . sevelamer carbonate (RENVELA) tablet 1,600 mg  1,600 mg Oral TID WC Sherren Mocha, MD      . sevelamer carbonate (RENVELA) tablet 800 mg  800 mg Oral PRN Sherren Mocha, MD      . simvastatin (ZOCOR) tablet 5 mg  5 mg Oral QHS Sherren Mocha, MD   5 mg at 06/26/12 2250  . sodium chloride 0.9 % injection 3 mL  3 mL Intravenous Q12H Sherren Mocha, MD      . sodium chloride 0.9 % injection 3 mL  3 mL Intravenous PRN Sherren Mocha, MD      . sodium chloride 0.9 % injection 3 mL  3 mL Intravenous Q12H Sherren Mocha, MD      . sodium chloride 0.9 % injection 3 mL  3 mL Intravenous PRN Sherren Mocha, MD      . sorbitol 70 % solution 30 mL  30 mL Oral BID PRN Sherren Mocha, MD       Labs: Basic Metabolic Panel:  Recent Labs Lab 06/26/12 1342 06/27/12 0340  NA 139 139  K 5.1 4.8  CL 103 105  CO2 23 21  GLUCOSE 78 182*  BUN 46* 48*  CREATININE 6.84* 7.05*  CALCIUM 9.0 8.5   CBC:  Recent Labs Lab 06/26/12 1342 06/27/12 0340  WBC 4.8 7.3  HGB 12.0 11.0*  HCT 35.4* 33.6*  MCV 85.1 87.0  PLT PLATELET CLUMPS NOTED ON SMEAR, UNABLE TO ESTIMATE 214   Cardiac Enzymes:  Recent Labs Lab 06/26/12 2152 06/27/12 0307 06/27/12 0918  TROPONINI <0.30 <0.30 <0.30   CBG:  Recent Labs Lab 06/26/12 2141 06/27/12 0804  GLUCAP 91 109*    Studies/Results: Dg Chest 2 View  06/26/2012  *RADIOLOGY REPORT*  Clinical Data: Chest pain  CHEST - 2 VIEW  Comparison: 06/09/2011  Findings: Heart is still silhouette is within normal limits.  Clear lungs.  No pneumothorax. Tiny pleural effusions are suspected. Minimal bronchitic changes are stable.  IMPRESSION: Minimal bronchitic changes are stable.  Tiny pleural effusions are suspected.  No consolidation.   Original Report Authenticated By: Marybelle Killings, M.D.     ROS: Chest pain, nausea, decreased appetite. 10 pt ROS asked and answered.  All other systems negative except as described in the HPI above.   Physical Exam: Filed Vitals:   06/27/12 0530 06/27/12 0600 06/27/12 0700 06/27/12 0800  BP: 99/67 113/77 106/53 125/83  Pulse:  87 86 90  Temp:    98.2 F (36.8 C)  TempSrc:    Oral  Resp:  15 13 18   Height:      Weight:      SpO2:  98% 98% 99%     General: Well developed, well nourished, in no acute distress. Head: Normocephalic, atraumatic, sclera non-icteric, mucus membranes are moist Neck: Supple. JVD not elevated. Lungs: Clear bilaterally to auscultation without wheezes, rales, or rhonchi. Breathing is unlabored. Heart: RRR with S1 S2. No murmurs, rubs, or gallops appreciated. Abdomen: Soft, non-tender, non-distended with normoactive bowel sounds. No rebound/guarding. No obvious abdominal masses. M-S:  Strength and tone appear normal for age. Lower extremities: Trace  LE edema. No ischemic changes, no open wounds  Neuro: Alert and oriented X 3. Moves all extremities spontaneously. Psych:  Responds to questions appropriately with a normal affect. Dialysis Access: LFA AVF with + bruit  Dialysis Orders: Center: AF on MWF . EDW 91 D 180 Bath 2K/2.25Ca Time 4:00 Heparin 9000. Access LFA AVF BFR 450 DFR A1.5   Hectorol 0 mcg IV/HD Epogen 1000   Units IV/HD  Venofer  0    Assessment/Plan: 1. Unstable Angina Pectoris - Troponin negative x 3 and EKG is non-diagnostic. Cardiac  cath today with no significant left main disease, but moderate diffuse disease to the distal RCA.  EF 60-65% with no regional wall motion abnormalities. No PCI indicated. V/Q scan ordered. Will be discharged if there are no findings suggestive of PE per cards. 2. ESRD -  MWF @ [facility].  Last HD Friday. K+ 4.8. HD after Cath today 3. Hypertension/volume  - BP 125/83. On amlodipine 10, Hydralazine 25 qhs and Labetolol 200 BID. Trace LE edema but no other s/s of volume excess. Will try for UF 3L this evening. 4. Anemia  - Hgb 11.0 on op Epo 1000 u. Hold ESA's for now. Last Tsat 27% on 2/26. No op IV Fe. 5. Metabolic bone disease -  Ca 8.5 (8.1 corr) Last P 3.2 on Renvela 2 with meals/ 1 with snack. PTH 229.3 in Jan.  6. Nutrition - Last Albumin 4.4. NPO for now. Renal vitamin 7. DM - on insulin per admit 8. Hx of Esophageal Erosions - per EGD 10/2011  Sonnie Alamo, PA-C [facility] Pager [phone] 06/27/2012, 10:26 AM   Patient seen and examined.  Agree with assessment and plan as above.  37 yo with hx of HTN, DM and ESRD admitted with chest pain.  Heart cath showed nonobstructive disease today, and V/Q scan is ordered. Stable from renal standpoint, no vol excess or pulm edema. Plan HD today.  Kelly Splinter  MD [phone] pgr    [phone] cell 06/27/2012, 5:22 PM

## 2012-06-28 ENCOUNTER — Inpatient Hospital Stay (HOSPITAL_COMMUNITY): Payer: Medicare Other

## 2012-06-28 ENCOUNTER — Encounter (HOSPITAL_COMMUNITY): Payer: Self-pay | Admitting: Physician Assistant

## 2012-06-28 LAB — GLUCOSE, CAPILLARY
Glucose-Capillary: 123 mg/dL — ABNORMAL HIGH (ref 70–99)
Glucose-Capillary: 115 mg/dL — ABNORMAL HIGH (ref 70–99)
Glucose-Capillary: 118 mg/dL — ABNORMAL HIGH (ref 70–99)

## 2012-06-28 LAB — CBC
HCT: 33.6 % — ABNORMAL LOW (ref 36.0–46.0)
Hemoglobin: 11.2 g/dL — ABNORMAL LOW (ref 12.0–15.0)
MCHC: 33.3 g/dL (ref 30.0–36.0)
RBC: 3.93 MIL/uL (ref 3.87–5.11)
WBC: 5.4 10*3/uL (ref 4.0–10.5)

## 2012-06-28 MED ORDER — PANTOPRAZOLE SODIUM 40 MG PO TBEC
40.0000 mg | DELAYED_RELEASE_TABLET | Freq: Every day | ORAL | Status: DC
  Administered 2012-06-28: 40 mg via ORAL
  Filled 2012-06-28: qty 1

## 2012-06-28 MED ORDER — AMLODIPINE BESYLATE 10 MG PO TABS
10.0000 mg | ORAL_TABLET | Freq: Every day | ORAL | Status: DC
  Administered 2012-06-28: 10 mg via ORAL
  Filled 2012-06-28: qty 1

## 2012-06-28 MED ORDER — SIMVASTATIN 5 MG PO TABS
5.0000 mg | ORAL_TABLET | Freq: Every day | ORAL | Status: DC
  Filled 2012-06-28: qty 1

## 2012-06-28 MED ORDER — OXYCODONE-ACETAMINOPHEN 5-325 MG PO TABS
1.0000 | ORAL_TABLET | Freq: Four times a day (QID) | ORAL | Status: DC | PRN
  Administered 2012-06-28: 1 via ORAL
  Filled 2012-06-28: qty 1

## 2012-06-28 MED ORDER — RENA-VITE PO TABS
1.0000 | ORAL_TABLET | Freq: Every day | ORAL | Status: DC
  Filled 2012-06-28: qty 1

## 2012-06-28 MED ORDER — LABETALOL HCL 200 MG PO TABS
200.0000 mg | ORAL_TABLET | Freq: Two times a day (BID) | ORAL | Status: DC
  Administered 2012-06-28: 11:00:00 200 mg via ORAL
  Filled 2012-06-28 (×2): qty 1

## 2012-06-28 MED ORDER — HYDRALAZINE HCL 25 MG PO TABS
25.0000 mg | ORAL_TABLET | Freq: Every day | ORAL | Status: DC
  Filled 2012-06-28: qty 1

## 2012-06-28 MED ORDER — INSULIN ASPART PROT & ASPART (70-30 MIX) 100 UNIT/ML ~~LOC~~ SUSP
15.0000 [IU] | Freq: Two times a day (BID) | SUBCUTANEOUS | Status: DC
  Administered 2012-06-28: 15 [IU] via SUBCUTANEOUS
  Filled 2012-06-28: qty 10

## 2012-06-28 MED ORDER — LINAGLIPTIN 5 MG PO TABS
5.0000 mg | ORAL_TABLET | Freq: Every day | ORAL | Status: DC
  Filled 2012-06-28: qty 1

## 2012-06-28 NOTE — Progress Notes (Signed)
[facility] Progress Note  Subjective:   Resting in bed. C/o soreness to r groin s/p cath. CP now only with deep breathing.  Objective Filed Vitals:   06/27/12 2009 06/28/12 0001 06/28/12 0525 06/28/12 0740  BP: 118/82 114/60 102/42 108/58  Pulse: 102 101 93 88  Temp:  98 F (36.7 C) 98.3 F (36.8 C) 98 F (36.7 C)  TempSrc:  Oral Oral Oral  Resp:      Height:      Weight:   91.6 kg (201 lb 15.1 oz)   SpO2: 100% 100% 99% 96%   Physical Exam General: Alert, cooperative, NAD Heart: RRR Lungs: CTA bilaterally. No wheezes, rales or rhonchi noted Abdomen: Soft, non-tender, non-distended. Normal BS Extremities: Rt groin with small hematoma. No LE edema Dialysis Access: LFA AVF with +bruit  Dialysis Orders: Center: AF on MWF .  EDW 91 D 180 Bath 2K/2.25Ca Time 4:00 Heparin 9000. Access LFA AVF BFR 450 DFR A1.5 Hectorol 0 mcg IV/HD Epogen 1000 Units IV/HD Venofer 0    Assessment/Plan: 1. Unstable Angina Pectoris - Troponin negative x 3 and EKG is non-diagnostic. Cardiac cath today with no significant left main disease, but moderate diffuse disease to the distal RCA. EF 60-65% with no regional wall motion abnormalities. No PCI indicated. Had V/Q scan  this a.m. Results pending. V/Q veyr low probability for PE, anticipate d/c today per cardiology 2. ESRD - MWF @ [facility]. Last HD Friday. K+ 4.8. Had HD yesterday off schedule.  Pt prefers to resume her regular op HD schedule on Friday 3. Hypertension/volume - BP 108/58. On amlodipine 10, Hydralazine 25 qhs and Labetolol 200 BID. Trace LE edema but no other s/s of volume excess.  4. Anemia - Hgb 11.2 on op Epo 1000 u. Hold ESA's for now. Last Tsat 27% on 2/26. No op IV Fe. 5. Metabolic bone disease - Ca 8.5 (8.1 corr) Last P 3.2 on Renvela 2 with meals/ 1 with snack. PTH 229.3 in Jan.  6. Nutrition - Last Albumin 4.4. Renal diet. Renal vitamin 7. DM - on insulin per admit 8. Hx of Esophageal Erosions - per EGD  10/2011 9. Dispo - Home today if V/Q negative per admit   Santiago Glad E. Rhodia Albright [facility] Pager [phone] 06/28/2012,12:34 PM  LOS: 2 days   Patient seen and examined.  I agree with plan as above with additions as indicated. Kelly Splinter  MD [phone] pgr    [phone] cell 06/28/2012, 2:40 PM   Additional Objective Labs: Basic Metabolic Panel:  Recent Labs Lab 06/26/12 1342 06/27/12 0340  NA 139 139  K 5.1 4.8  CL 103 105  CO2 23 21  GLUCOSE 78 182*  BUN 46* 48*  CREATININE 6.84* 7.05*  CALCIUM 9.0 8.5   CBC:  Recent Labs Lab 06/26/12 1342 06/27/12 0340 06/28/12 0838  WBC 4.8 7.3 5.4  HGB 12.0 11.0* 11.2*  HCT 35.4* 33.6* 33.6*  MCV 85.1 87.0 85.5  PLT PLATELET CLUMPS NOTED ON SMEAR, UNABLE TO ESTIMATE 214 171   Blood Culture    Component Value Date/Time   SDES URINE, RANDOM 04/17/2011 0002   SPECREQUEST NONE 04/17/2011 0002   CULT INSIGNIFICANT GROWTH 04/17/2011 0002   REPTSTATUS 04/18/2011 FINAL 04/17/2011 0002    Cardiac Enzymes:  Recent Labs Lab 06/26/12 2152 06/27/12 0307 06/27/12 0918  TROPONINI <0.30 <0.30 <0.30   CBG:  Recent Labs Lab 06/26/12 2141 06/27/12 0804 06/27/12 1310 06/27/12 2016 06/28/12 0808  GLUCAP 91  109* 103* 162* 123*   Studies/Results: Dg Chest 2 View  06/26/2012  *RADIOLOGY REPORT*  Clinical Data: Chest pain  CHEST - 2 VIEW  Comparison: 06/09/2011  Findings: Heart is still silhouette is within normal limits.  Clear lungs.  No pneumothorax. Tiny pleural effusions are suspected. Minimal bronchitic changes are stable.  IMPRESSION: Minimal bronchitic changes are stable.  Tiny pleural effusions are suspected.  No consolidation.   Original Report Authenticated By: Marybelle Killings, M.D.    Medications:   . amLODipine  10 mg Oral Daily  . aspirin  81 mg Oral Daily  . heparin  5,000 Units Subcutaneous Q8H  . hydrALAZINE  25 mg Oral QHS  . insulin aspart protamine-insulin aspart  15 Units Subcutaneous BID WC   . labetalol  200 mg Oral BID  . linagliptin  5 mg Oral QHS  . multivitamin  1 tablet Oral QHS  . nitroGLYCERIN  0.5 inch Topical Once  . nitroGLYCERIN  1 inch Topical Q6H  . pantoprazole  40 mg Oral Daily  . sevelamer carbonate  1,600 mg Oral TID WC  . simvastatin  5 mg Oral QHS

## 2012-06-28 NOTE — Discharge Summary (Signed)
Discharge Summary   Patient ID: Linda Ellison MRN: [id number], DOB/AGE: 12-10-1975 37 y.o. Admit date: 06/26/2012 D/C date:     06/28/2012  Primary Cardiologist: Appears to be new to [facility]. Seen by Burt Knack first this admission.  Primary Discharge Diagnoses:  1. Atypical chest pain - ruled out, cardiac cath with mild nonobstructive CAD this admission (false positive stress echo at [facility]) - negative VQ scan 2. ESRD on HD, undergoing evaluation for kidney transplant  Secondary Discharge Diagnoses:  1. Asthma   2. Weight loss   3. Loss of appetite   4. CHF (congestive heart failure)  5. Hypertension   6. Hyperlipidemia   7. Diabetes mellitus age 37  8. Peripheral neuropathy  9. Iron deficiency anemia   10. Hyperparathyroidism, secondary   11. Sciatica   12. GERD (gastroesophageal reflux disease)  13. Adenoma   14. Gastroparesis  15. Hx of Esophageal Erosions - per EGD 10/2011  Hospital Course:Linda Ellison is a 37 y/o F with long-standing diabetes, hypertension, and dyslipidemia, ESRD on HD currently undergoing eval for renal transplant who presented to the ED 06/26/12 with substernal chest pain. She's had chest discomfort now on and off for about 2 weeks, occurring both at rest and with exertion. She described a pressure like sensation in the chest radiating down both arms with associated dyspnea. She denied palpitations, orthopnea, or PND. Her chest pain has not been associated with dialysis. She called her dialysis center with the complaint of CP and was referred to the ER. As part of her kidney transplant workup, she recently underwent dobutamine stress echocardiogram as part of this evaluation on 06/13/2012. This showed nondiagnostic ST segment depression with dobutamine, but she did have inducible wall motion abnormalities. This demonstrated a moderate sized apical, anteroseptal, and anterior wall motion abnormality with stress. EKG in the ER showed no acuet changes. Symptoms were  concerning for [place] in light of abnormal nuc. She was started on IV heparin. She underwent LHC the following day showing mild nonobstructive CAD (mild luminal irreg of LAD, mod diffuse distal RCA disease, EF 60-65%, no WMA).  Despite persistent low grade CP, cardiac enzymes remained reassuringly normal. She had her regular dialysis session yesterday. She underwent VQ scan this morning which was normal with very low probability for PE. Dr. Aundra Dubin has seen and examined her today and feels she is stable for discharge. Per renal, may resume lisinopril at discharge. We will arrange for a post-cath followup appointment. She will continue dialysis as scheduled.  Discharge Vitals: Blood pressure 117/71, pulse 91, temperature 98 F (36.7 C), temperature source Oral, resp. rate 16, height 5' 6.14" (1.68 m), weight 201 lb 15.1 oz (91.6 kg), last menstrual period 06/16/2012, SpO2 100.00%.  Labs: Lab Results  Component Value Date   WBC 5.4 06/28/2012   HGB 11.2* 06/28/2012   HCT 33.6* 06/28/2012   MCV 85.5 06/28/2012   PLT 171 06/28/2012     Recent Labs Lab 06/27/12 0340  NA 139  K 4.8  CL 105  CO2 21  BUN 48*  CREATININE 7.05*  CALCIUM 8.5  GLUCOSE 182*    Recent Labs  06/26/12 2152 06/27/12 0307 06/27/12 0918  TROPONINI <0.30 <0.30 <0.30   Lab Results  Component Value Date   CHOL 120 06/27/2012   HDL 49 06/27/2012   LDLCALC 54 06/27/2012   TRIG 85 06/27/2012     Diagnostic Studies/Procedures   1. Cardiac catheterization this admission, please see full report and above for summary. 2.  Dg Chest 2 View 06/26/2012  *RADIOLOGY REPORT*  Clinical Data: Chest pain  CHEST - 2 VIEW  Comparison: 06/09/2011  Findings: Heart is still silhouette is within normal limits.  Clear lungs.  No pneumothorax. Tiny pleural effusions are suspected. Minimal bronchitic changes are stable.  IMPRESSION: Minimal bronchitic changes are stable.  Tiny pleural effusions are suspected.  No consolidation.   Original Report  Authenticated By: Marybelle Killings, M.D.   3. Nm Pulmonary Perf And Vent 06/28/2012  *RADIOLOGY REPORT*  Clinical Data: Chest pain  NM PULMONARY VENTILATION AND PERFUSION SCAN  Views:  Anterior, posterior, right lateral, left lateral, RPO, LPO, RAO, LAO - ventilation and perfusion  Radiopharmaceutical:  Technetium 59m DTPA - ventilation; technetium 30m macroaggregated albumin - perfusion  Dose:  40.0 mCi - ventilation; 6.0 mCi - perfusion  Route of administration:  Inhalation - ventilation; intravenous - perfusion  Comparison: Chest radiograph June 26, 2012  Findings: Ventilation study shows homogeneous and symmetric uptake of radiotracer bilaterally.  Perfusion study shows homogeneous and symmetric uptake of radiotracer bilaterally.  No appreciable ventilation / perfusion mismatch.  IMPRESSION:  Normal ventilation and perfusion lung scans.  Very low probability of pulmonary embolus.   Original Report Authenticated By: Lowella Grip, M.D.     Discharge Medications     Medication List    TAKE these medications       ACCU-CHEK AVIVA PLUS test strip  Generic drug:  glucose blood  Use to check blood sugar twice daily     ACCU-CHEK SOFTCLIX LANCETS lancets  Use to check blood sugar twice daily     albuterol 108 (90 BASE) MCG/ACT inhaler  Commonly known as:  PROVENTIL HFA;VENTOLIN HFA  Inhale 2 puffs into the lungs every 6 (six) hours as needed. For shortness of breath     amLODipine 10 MG tablet  Commonly known as:  NORVASC  Take 10 mg by mouth daily.     aspirin 81 MG tablet  Take 81 mg by mouth daily.     B-D INS SYRINGE 0.5CC/31GX5/16 31G X 5/16" 0.5 ML Misc  Generic drug:  Insulin Syringe-Needle U-100  Use as directed once daily     esomeprazole 20 MG capsule  Commonly known as:  NEXIUM  Take 20 mg by mouth daily before breakfast.     hydrALAZINE 25 MG tablet  Commonly known as:  APRESOLINE  Take 25 mg by mouth at bedtime.     labetalol 200 MG tablet  Commonly known as:   NORMODYNE  Take 200 mg by mouth 2 (two) times daily.     lisinopril 20 MG tablet  Commonly known as:  PRINIVIL,ZESTRIL  Take 20 mg by mouth at bedtime.     multivitamin Tabs tablet (RENA-VITE)  Take 1 tablet by mouth daily.     NOVOLOG MIX 70/30 [place]  Inject 15-20 Units into the skin 2 (two) times daily. 15 units every morning and 20 units every evening     sevelamer carbonate 800 MG tablet  Commonly known as:  RENVELA  Take 800-1,600 mg by mouth 3 (three) times daily with meals. 1600 mg with meals and 800 mg with snacks     simvastatin 5 MG tablet  Commonly known as:  ZOCOR  Take 5 mg by mouth at bedtime.     sorbitol 70 % solution  Take 30 mLs by mouth 2 (two) times daily as needed.     TRADJENTA 5 MG Tabs tablet  Generic drug:  linagliptin  Take 5 mg  by mouth at bedtime.        Disposition   The patient will be discharged in stable condition to home. Discharge Orders   Future Appointments Provider Department Dept Phone   07/20/2012 10:30 AM Burtis Junes, NP [place]) [phone]   Future Orders Complete By Expires     Diet - low sodium heart healthy  As directed     Comments:      Renal Diet    Increase activity slowly  As directed     Comments:      No driving for 2 days. No lifting over 5 lbs for 1 week. No sexual activity for 1 week. Keep procedure site clean & dry. If you notice increased pain, swelling, bleeding or pus, call/return!  You may shower, but no soaking baths/hot tubs/pools for 1 week.      Follow-up Information   Follow up with Truitt Merle, NP. (07/20/12 at 10:30am)    Contact information:   [place] [place] [address] [phone]       Follow up with Windy Kalata, MD. (As scheduled )    Contact information:   [address] [address] [phone]       Follow up with [facility]. (Continue with dialysis as scheduled)       Follow up  with Elyn Peers, MD. (To evaluate for other causes of your chest pain)    Contact information:   [phone]         Duration of Discharge Encounter: Greater than 30 minutes including physician and PA time.  Signed, Melina Copa PA-C 06/28/2012, 3:09 PM

## 2012-06-28 NOTE — Progress Notes (Addendum)
Patient ID: Linda Ellison, female   DOB: 05-Apr-1976, 37 y.o.   MRN: [id number]     SUBJECTIVE: Ongoing very low level chest pain, somewhat pleuritic.  Much improved compared to admission.    Marland Kitchen aspirin  81 mg Oral Daily  . heparin  5,000 Units Subcutaneous Q8H  . nitroGLYCERIN  0.5 inch Topical Once  . nitroGLYCERIN  1 inch Topical Q6H  . sevelamer carbonate  1,600 mg Oral TID WC      Filed Vitals:   06/27/12 2009 06/28/12 0001 06/28/12 0525 06/28/12 0740  BP: 118/82 114/60 102/42 108/58  Pulse: 102 101 93 88  Temp:  98 F (36.7 C) 98.3 F (36.8 C) 98 F (36.7 C)  TempSrc:  Oral Oral Oral  Resp:      Height:      Weight:   201 lb 15.1 oz (91.6 kg)   SpO2: 100% 100% 99% 96%    Intake/Output Summary (Last 24 hours) at 06/28/12 0811 Last data filed at 06/27/12 2300  Gross per 24 hour  Intake    440 ml  Output   3100 ml  Net  -2660 ml    LABS: Basic Metabolic Panel:  Recent Labs  06/26/12 1342 06/27/12 0340  NA 139 139  K 5.1 4.8  CL 103 105  CO2 23 21  GLUCOSE 78 182*  BUN 46* 48*  CREATININE 6.84* 7.05*  CALCIUM 9.0 8.5   Liver Function Tests: No results found for this basename: AST, ALT, ALKPHOS, BILITOT, PROT, ALBUMIN,  in the last 72 hours No results found for this basename: LIPASE, AMYLASE,  in the last 72 hours CBC:  Recent Labs  06/26/12 1342 06/27/12 0340  WBC 4.8 7.3  HGB 12.0 11.0*  HCT 35.4* 33.6*  MCV 85.1 87.0  PLT PLATELET CLUMPS NOTED ON SMEAR, UNABLE TO ESTIMATE 214   Cardiac Enzymes:  Recent Labs  06/26/12 2152 06/27/12 0307 06/27/12 0918  TROPONINI <0.30 <0.30 <0.30   BNP: No components found with this basename: POCBNP,  D-Dimer: No results found for this basename: DDIMER,  in the last 72 hours Hemoglobin A1C: No results found for this basename: HGBA1C,  in the last 72 hours Fasting Lipid Panel:  Recent Labs  06/27/12 0340  CHOL 120  HDL 49  LDLCALC 54  TRIG 85  CHOLHDL 2.4   Thyroid Function Tests: No  results found for this basename: TSH, T4TOTAL, FREET3, T3FREE, THYROIDAB,  in the last 72 hours Anemia Panel: No results found for this basename: VITAMINB12, FOLATE, FERRITIN, TIBC, IRON, RETICCTPCT,  in the last 72 hours  RADIOLOGY: Dg Chest 2 View  06/26/2012  *RADIOLOGY REPORT*  Clinical Data: Chest pain  CHEST - 2 VIEW  Comparison: 06/09/2011  Findings: Heart is still silhouette is within normal limits.  Clear lungs.  No pneumothorax. Tiny pleural effusions are suspected. Minimal bronchitic changes are stable.  IMPRESSION: Minimal bronchitic changes are stable.  Tiny pleural effusions are suspected.  No consolidation.   Original Report Authenticated By: Marybelle Killings, M.D.     PHYSICAL EXAM General: NAD Neck: No JVD, no thyromegaly or thyroid nodule.  Lungs: Clear to auscultation bilaterally with normal respiratory effort. CV: Nondisplaced PMI.  Heart regular S1/S2, no S3/S4, no murmur.  No peripheral edema.  No carotid bruit.  Normal pedal pulses.  Abdomen: Soft, nontender, no hepatosplenomegaly, no distention.  Neurologic: Alert and oriented x 3.  Psych: Normal affect. Extremities: No clubbing or cyanosis. Right groin cath site tender but no significant  ecchymosis or hematoma.   TELEMETRY: Reviewed telemetry pt in NSR  ASSESSMENT AND PLAN: 37 yo with history of ESRD and recent abnormal stress echo as part of renal transplant workup presented with chest pain.  Chest pain has been persistent but now very low level.  It has been somewhat pleuritic. ECG is normal.  Cardiac enzymes, reassuringly, remain normal.  LHC showed mild nonobstructive CAD (false positive stress echo at [facility]).  V/Q scan did not get done yesterday, she is going for it this morning. If V/Q is normal/low probability, home today.  Will get CBC.   Loralie Champagne 06/28/2012 8:11 AM

## 2012-07-20 ENCOUNTER — Encounter: Payer: Medicare Other | Admitting: Nurse Practitioner

## 2012-08-22 ENCOUNTER — Emergency Department (HOSPITAL_BASED_OUTPATIENT_CLINIC_OR_DEPARTMENT_OTHER)
Admission: EM | Admit: 2012-08-22 | Discharge: 2012-08-23 | Disposition: A | Discharge status: Home / Self Care | Payer: Medicare Other | Attending: Emergency Medicine | Admitting: Emergency Medicine

## 2012-08-22 ENCOUNTER — Emergency Department (HOSPITAL_BASED_OUTPATIENT_CLINIC_OR_DEPARTMENT_OTHER): Payer: Medicare Other

## 2012-08-22 ENCOUNTER — Encounter (HOSPITAL_BASED_OUTPATIENT_CLINIC_OR_DEPARTMENT_OTHER): Payer: Self-pay

## 2012-08-22 DIAGNOSIS — Y9301 Activity, walking, marching and hiking: Secondary | ICD-10-CM | POA: Insufficient documentation

## 2012-08-22 DIAGNOSIS — K219 Gastro-esophageal reflux disease without esophagitis: Secondary | ICD-10-CM | POA: Insufficient documentation

## 2012-08-22 DIAGNOSIS — Z8719 Personal history of other diseases of the digestive system: Secondary | ICD-10-CM | POA: Insufficient documentation

## 2012-08-22 DIAGNOSIS — Z992 Dependence on renal dialysis: Secondary | ICD-10-CM | POA: Insufficient documentation

## 2012-08-22 DIAGNOSIS — S9032XA Contusion of left foot, initial encounter: Secondary | ICD-10-CM

## 2012-08-22 DIAGNOSIS — Z8639 Personal history of other endocrine, nutritional and metabolic disease: Secondary | ICD-10-CM | POA: Insufficient documentation

## 2012-08-22 DIAGNOSIS — Y929 Unspecified place or not applicable: Secondary | ICD-10-CM | POA: Insufficient documentation

## 2012-08-22 DIAGNOSIS — I2510 Atherosclerotic heart disease of native coronary artery without angina pectoris: Secondary | ICD-10-CM | POA: Insufficient documentation

## 2012-08-22 DIAGNOSIS — Z8669 Personal history of other diseases of the nervous system and sense organs: Secondary | ICD-10-CM | POA: Insufficient documentation

## 2012-08-22 DIAGNOSIS — IMO0002 Reserved for concepts with insufficient information to code with codable children: Secondary | ICD-10-CM | POA: Insufficient documentation

## 2012-08-22 DIAGNOSIS — Z872 Personal history of diseases of the skin and subcutaneous tissue: Secondary | ICD-10-CM | POA: Insufficient documentation

## 2012-08-22 DIAGNOSIS — J45909 Unspecified asthma, uncomplicated: Secondary | ICD-10-CM | POA: Insufficient documentation

## 2012-08-22 DIAGNOSIS — Z79899 Other long term (current) drug therapy: Secondary | ICD-10-CM | POA: Insufficient documentation

## 2012-08-22 DIAGNOSIS — Z7982 Long term (current) use of aspirin: Secondary | ICD-10-CM | POA: Insufficient documentation

## 2012-08-22 DIAGNOSIS — Z794 Long term (current) use of insulin: Secondary | ICD-10-CM | POA: Insufficient documentation

## 2012-08-22 DIAGNOSIS — Z862 Personal history of diseases of the blood and blood-forming organs and certain disorders involving the immune mechanism: Secondary | ICD-10-CM | POA: Insufficient documentation

## 2012-08-22 DIAGNOSIS — E119 Type 2 diabetes mellitus without complications: Secondary | ICD-10-CM | POA: Insufficient documentation

## 2012-08-22 DIAGNOSIS — S9030XA Contusion of unspecified foot, initial encounter: Secondary | ICD-10-CM | POA: Insufficient documentation

## 2012-08-22 DIAGNOSIS — Z8739 Personal history of other diseases of the musculoskeletal system and connective tissue: Secondary | ICD-10-CM | POA: Insufficient documentation

## 2012-08-22 DIAGNOSIS — I120 Hypertensive chronic kidney disease with stage 5 chronic kidney disease or end stage renal disease: Secondary | ICD-10-CM | POA: Insufficient documentation

## 2012-08-22 DIAGNOSIS — E785 Hyperlipidemia, unspecified: Secondary | ICD-10-CM | POA: Insufficient documentation

## 2012-08-22 DIAGNOSIS — I509 Heart failure, unspecified: Secondary | ICD-10-CM | POA: Insufficient documentation

## 2012-08-22 DIAGNOSIS — N186 End stage renal disease: Secondary | ICD-10-CM | POA: Insufficient documentation

## 2012-08-22 DIAGNOSIS — Z9889 Other specified postprocedural states: Secondary | ICD-10-CM | POA: Insufficient documentation

## 2012-08-22 NOTE — ED Notes (Signed)
Surgery to left foot 1 week ago-cam walker in place-pt states she slipped in driveway today-c/o pain to foot

## 2012-08-23 NOTE — ED Provider Notes (Signed)
History     CSN: [id number]  Arrival date & time 08/22/12  2252   First MD Initiated Contact with Patient 08/22/12 2349      Chief Complaint  Patient presents with  . Foot Injury    (Consider location/radiation/quality/duration/timing/severity/associated sxs/prior treatment) Patient is a 37 y.o. female presenting with foot injury. The history is provided by the patient.  Foot Injury Location:  Foot Injury: yes   Mechanism of injury: fall   Fall:    Fall occurred:  Walking (hit lateral foot on ground is 8 days post op from foot surgery)   Impact surface:  Product manager of impact:  Feet   Entrapped after fall: no   Foot location:  L foot Pain details:    Quality:  Aching   Radiates to:  Does not radiate   Timing:  Constant   Progression:  Unchanged Chronicity:  New Dislocation: no   Foreign body present:  No foreign bodies Relieved by:  Nothing Worsened by:  Nothing tried Ineffective treatments:  None tried Associated symptoms: no stiffness   Risk factors: no concern for non-accidental trauma     Past Medical History  Diagnosis Date  . Asthma   . Chronic kidney disease     started dialysis 01/29/11  . Weight loss   . Loss of appetite   . CHF (congestive heart failure)   . Hypertension   . Hyperlipidemia   . Diabetes mellitus age 50  . Peripheral neuropathy   . Iron deficiency anemia   . Hyperparathyroidism, secondary   . Sciatica   . GERD (gastroesophageal reflux disease)   . Adenoma   . Gastroparesis   . Esophageal erosions     Per EGD 10/2011  . CAD (coronary artery disease)     False positive stress echo 06/2012 at [facility] - cath with mild nonobstructive CAD at [facility].    Past Surgical History  Procedure Laterality Date  . Cesarean section    . Cholecystectomy  1996  . Av fistula placement  08/2010    Left radiocephalic AVF  . Ligation goretex fistula  01/04/11    Left AVF  . Colonoscopy    . Esophagogastroduodenoscopy  10/19/2011    Dr. Silvano Rusk  . Foot surgery    . Tubal ligation      Family History  Problem Relation Age of Onset  . Heart disease Mother     heart attack at 32 y/o-infected valve  . Kidney disease Mother     was a dialysis patient  . Heart disease Brother   . Kidney disease Maternal Grandmother     pre-dialysis  . Kidney failure Paternal Uncle   . Kidney failure Paternal Aunt   . Diabetes Father   . Hypertension Father   . Colon cancer Neg Hx     History  Substance Use Topics  . Smoking status: Never Smoker   . Smokeless tobacco: Never Used  . Alcohol Use: No    OB History   Grav Para Term Preterm Abortions TAB SAB Ect Mult Living                  Review of Systems  Musculoskeletal: Negative for stiffness.  All other systems reviewed and are negative.    Allergies  Ciprofloxacin  Home Medications   Current Outpatient Rx  Name  Route  Sig  Dispense  Refill  . ACCU-CHEK AVIVA PLUS test strip      Use to check  blood sugar twice daily         . ACCU-CHEK SOFTCLIX LANCETS lancets      Use to check blood sugar twice daily         . albuterol (PROVENTIL HFA;VENTOLIN HFA) 108 (90 BASE) MCG/ACT inhaler   Inhalation   Inhale 2 puffs into the lungs every 6 (six) hours as needed. For shortness of breath         . amLODipine (NORVASC) 10 MG tablet   Oral   Take 10 mg by mouth daily.           Marland Kitchen aspirin 81 MG tablet   Oral   Take 81 mg by mouth daily.           . B-D INS SYRINGE 0.5CC/31GX5/16 31G X 5/16" 0.5 ML MISC      Use as directed once daily         . esomeprazole (NEXIUM) 20 MG capsule   Oral   Take 20 mg by mouth daily before breakfast.         . hydrALAZINE (APRESOLINE) 25 MG tablet   Oral   Take 25 mg by mouth at bedtime.         . Insulin Aspart Prot & Aspart (NOVOLOG MIX 70/30 [place])   Subcutaneous   Inject 15-20 Units into the skin 2 (two) times daily. 15 units every morning and 20 units every evening         . labetalol (NORMODYNE) 200  MG tablet   Oral   Take 200 mg by mouth 2 (two) times daily.          Marland Kitchen linagliptin (TRADJENTA) 5 MG TABS tablet   Oral   Take 5 mg by mouth at bedtime.         Marland Kitchen lisinopril (PRINIVIL,ZESTRIL) 20 MG tablet   Oral   Take 20 mg by mouth at bedtime.         . multivitamin (RENA-VIT) TABS tablet   Oral   Take 1 tablet by mouth daily.         . sevelamer carbonate (RENVELA) 800 MG tablet   Oral   Take 800-1,600 mg by mouth 3 (three) times daily with meals. 1600 mg with meals and 800 mg with snacks         . simvastatin (ZOCOR) 5 MG tablet   Oral   Take 5 mg by mouth at bedtime.         . sorbitol 70 % solution   Oral   Take 30 mLs by mouth 2 (two) times daily as needed.           BP 118/80  Pulse 102  Temp(Src) 98.2 F (36.8 C) (Oral)  Resp 20  Ht 5\' 6"  (1.676 m)  Wt 195 lb (88.451 kg)  BMI 31.49 kg/m2  SpO2 98%  LMP 08/13/2012  Physical Exam  Constitutional: She is oriented to person, place, and time. She appears well-developed and well-nourished. No distress.  HENT:  Head: Normocephalic and atraumatic.  Mouth/Throat: Oropharynx is clear and moist.  Eyes: Conjunctivae are normal. Pupils are equal, round, and reactive to light.  Neck: Normal range of motion. Neck supple.  Cardiovascular: Normal rate, regular rhythm and intact distal pulses.   Pulmonary/Chest: Effort normal and breath sounds normal. She has no wheezes.  Abdominal: Soft. Bowel sounds are normal. There is no tenderness. There is no rebound and no guarding.  Musculoskeletal: Normal range of motion.  Lateral foot incision  CDI no erythema no drainage left foot neurovascularly intact cap refill to toes < 2 sec  Neurological: She is alert and oriented to person, place, and time.  Skin: Skin is warm and dry.  Psychiatric: She has a normal mood and affect.    ED Course  Procedures (including critical care time)  Labs Reviewed - No data to display Dg Foot Complete Left  08/22/2012    *RADIOLOGY REPORT*  Clinical Data: Injured foot.  Recent foot surgery.  LEFT FOOT - COMPLETE 3+ VIEW  Comparison: None.  Findings: There is a single smooth pin in the fifth metatarsal neck.  Probable surgical changes from an osteotomy involving the proximal fifth phalanx.  No acute fractures identified.  IMPRESSION: Surgical changes involving the fifth metatarsal and fifth proximal phalanx.  No definite acute fracture.   Original Report Authenticated By: Marijo Sanes, M.D.     No diagnosis found.    MDM  Continue ice and elevation and follow up with your foot surgeon in am.  Continue dilaudid as directed by your surgeon         Jamyla Ard K Neema Barreira-Rasch, MD 08/23/12 0009

## 2012-09-04 ENCOUNTER — Other Ambulatory Visit (HOSPITAL_COMMUNITY): Payer: Self-pay | Admitting: Nephrology

## 2012-09-04 DIAGNOSIS — N186 End stage renal disease: Secondary | ICD-10-CM

## 2012-09-07 ENCOUNTER — Ambulatory Visit (HOSPITAL_COMMUNITY): Admission: RE | Admit: 2012-09-07 | Payer: Medicare Other | Source: Ambulatory Visit

## 2012-09-12 ENCOUNTER — Ambulatory Visit (HOSPITAL_COMMUNITY)
Admission: RE | Admit: 2012-09-12 | Discharge: 2012-09-12 | Disposition: A | Discharge status: Home / Self Care | Payer: Medicare Other | Source: Ambulatory Visit | Attending: Nephrology | Admitting: Nephrology

## 2012-09-12 DIAGNOSIS — I8390 Asymptomatic varicose veins of unspecified lower extremity: Secondary | ICD-10-CM | POA: Insufficient documentation

## 2012-09-12 DIAGNOSIS — N186 End stage renal disease: Secondary | ICD-10-CM

## 2012-09-12 DIAGNOSIS — Z992 Dependence on renal dialysis: Secondary | ICD-10-CM | POA: Insufficient documentation

## 2012-09-12 MED ORDER — IOHEXOL 300 MG/ML  SOLN
100.0000 mL | Freq: Once | INTRAMUSCULAR | Status: AC | PRN
  Administered 2012-09-12: 20 mL via INTRAVENOUS

## 2012-10-19 DIAGNOSIS — L709 Acne, unspecified: Secondary | ICD-10-CM | POA: Insufficient documentation

## 2012-10-19 DIAGNOSIS — L210 Seborrhea capitis: Secondary | ICD-10-CM | POA: Insufficient documentation

## 2012-10-19 DIAGNOSIS — L669 Cicatricial alopecia, unspecified: Secondary | ICD-10-CM | POA: Insufficient documentation

## 2012-11-28 ENCOUNTER — Ambulatory Visit (INDEPENDENT_AMBULATORY_CARE_PROVIDER_SITE_OTHER): Payer: Medicare Other | Admitting: Physician Assistant

## 2012-11-28 ENCOUNTER — Encounter: Payer: Self-pay | Admitting: Physician Assistant

## 2012-11-28 VITALS — BP 113/70 | HR 92 | Ht 66.0 in | Wt 208.0 lb

## 2012-11-28 DIAGNOSIS — R079 Chest pain, unspecified: Secondary | ICD-10-CM

## 2012-11-28 DIAGNOSIS — N186 End stage renal disease: Secondary | ICD-10-CM

## 2012-11-28 DIAGNOSIS — I2510 Atherosclerotic heart disease of native coronary artery without angina pectoris: Secondary | ICD-10-CM

## 2012-11-28 DIAGNOSIS — I10 Essential (primary) hypertension: Secondary | ICD-10-CM

## 2012-11-28 MED ORDER — ESOMEPRAZOLE MAGNESIUM 20 MG PO CPDR: 40.0000 mg | DELAYED_RELEASE_CAPSULE | Freq: Every day | ORAL | Status: DC

## 2012-11-28 NOTE — Patient Instructions (Addendum)
INCREASE NEXUIM 40 MG DAILY  PLEASE SCHEDULE ECHO  PLEASE FOLLOW UP WITH SCOTT WEAVER, PAC IN 3-4 WEEKS SAME DAY DR.COOPER IS IN THE OFFICE

## 2012-11-28 NOTE — Progress Notes (Signed)
[facility]. [address], [place]  [address] Phone: [phone] Fax:  [phone]  Date:  11/28/2012   ID:  Linda Ellison, DOB 12-16-1975, MRN [id number]  PCP:  Elyn Peers, MD  Cardiologist:  Dr. Sherren Mocha     History of Present Illness: Linda Ellison is a 37 y.o. female who returns for follow up.  She has a hx of DM2, HTN, HL, ESRD, GERD.  Echo 03/2010: EF 55-60%.  She had been undergoing evaluation for renal transplantation when she was admitted 3/24-3/26 after presenting with complaints of substernal chest pain. She had a recent dobutamine stress echocardiogram 06/13/12 as part of her work up for her transplant.  This demonstrated inducible wall motion abnormalities and apical, anteroseptal and anterior walls. Her symptoms were concerning for unstable angina and cardiac catheterization was recommended.  LHC 06/27/12:  Left mainstem: No significant disease; Left anterior descending (LAD): Mild luminal irregularities; Left circumflex (LCx): No significant disease; Right coronary artery (RCA): Moderate diffuse disease distal RCA;  EF 60-65%, no regional wall motion abnormalities in the RAO projection.  Cardiac markers remained normal. VQ scan was performed and demonstrated low probability for pulmonary embolism.  She was asked by her nephrologist recently to return for evaluation of chest pain. She's noted chest discomfort for the last 2-3 weeks. It is sharp and substernal. She sometimes notes left arm tingling. She cannot tell if this is from her AV fistula site or radiation from her chest pain. She feels slightly short of breath. She is nauseated and diaphoretic at times. She has tried nitroglycerin once with relief of her symptoms. She sometimes notes exertional symptoms that improvement with rest. She sometimes notes rest symptoms. She is sometimes able to exert herself without chest pain. She's had problems with low blood pressure recently. She's been dizzy with this.  She denies syncope. BP meds have been adjusted.  She does note some pleuritic chest discomfort. She also notes chest discomfort with palpation of her chest. She denies any supine chest pain.  Labs (3/14):  K 4.8, Cr 7.05, LDL 54, Hgb 11.2  Wt Readings from Last 3 Encounters:  11/28/12 208 lb (94.348 kg)  08/22/12 195 lb (88.451 kg)  06/28/12 201 lb 15.1 oz (91.6 kg)     Past Medical History  Diagnosis Date  . Asthma   . Chronic kidney disease     started dialysis 01/29/11  . Weight loss   . Loss of appetite   . CHF (congestive heart failure)   . Hypertension   . Hyperlipidemia   . Diabetes mellitus age 37  . Peripheral neuropathy   . Iron deficiency anemia   . Hyperparathyroidism, secondary   . Sciatica   . GERD (gastroesophageal reflux disease)   . Adenoma   . Gastroparesis   . Esophageal erosions     Per EGD 10/2011  . CAD (coronary artery disease)     False positive stress echo 06/2012 at [facility] - cath with mild nonobstructive CAD at [facility].    Current Outpatient Prescriptions  Medication Sig Dispense Refill  . ACCU-CHEK AVIVA PLUS test strip Use to check blood sugar twice daily      . ACCU-CHEK SOFTCLIX LANCETS lancets Use to check blood sugar twice daily      . albuterol (PROVENTIL HFA;VENTOLIN HFA) 108 (90 BASE) MCG/ACT inhaler Inhale 2 puffs into the lungs every 6 (six) hours as needed. For shortness of breath      . amLODipine (NORVASC) 10 MG  tablet Take 10 mg by mouth daily.        Marland Kitchen aspirin 81 MG tablet Take 81 mg by mouth daily.        . B-D INS SYRINGE 0.5CC/31GX5/16 31G X 5/16" 0.5 ML MISC Use as directed once daily      . esomeprazole (NEXIUM) 20 MG capsule Take 20 mg by mouth daily before breakfast.      . gabapentin (NEURONTIN) 100 MG capsule Take 100 mg by mouth daily.      . hydrALAZINE (APRESOLINE) 25 MG tablet Take 25 mg by mouth at bedtime.      . Insulin Aspart Prot & Aspart (NOVOLOG MIX 70/30 [place]) Inject 15-20 Units into the skin 2 (two) times daily. 15  units every morning and 20 units every evening      . labetalol (NORMODYNE) 200 MG tablet Take 200 mg by mouth 2 (two) times daily.       Marland Kitchen linagliptin (TRADJENTA) 5 MG TABS tablet Take 5 mg by mouth at bedtime.      Marland Kitchen lisinopril (PRINIVIL,ZESTRIL) 20 MG tablet Take 20 mg by mouth at bedtime.      . multivitamin (RENA-VIT) TABS tablet Take 1 tablet by mouth daily.      . nitroGLYCERIN (NITROSTAT) 0.4 MG SL tablet Place 0.4 mg under the tongue every 5 (five) minutes as needed for chest pain.      . sevelamer carbonate (RENVELA) 800 MG tablet Take 800-1,600 mg by mouth 3 (three) times daily with meals. 1600 mg with meals and 800 mg with snacks      . simvastatin (ZOCOR) 5 MG tablet Take 5 mg by mouth at bedtime.      . sorbitol 70 % solution Take 30 mLs by mouth 2 (two) times daily as needed.       No current facility-administered medications for this visit.    Allergies:    Allergies  Allergen Reactions  . Ciprofloxacin Itching and Nausea And Vomiting    Social History:  The patient  reports that she has never smoked. She has never used smokeless tobacco. She reports that she does not drink alcohol or use illicit drugs.   ROS:  Please see the history of present illness.   She notes some dysphagia. She denies odynophagia. She denies chest pain associated with meals. She denies melena or hematochezia   All other systems reviewed and negative.   PHYSICAL EXAM: VS:  BP 113/70  Pulse 92  Ht 5\' 6"  (1.676 m)  Wt 208 lb (94.348 kg)  BMI 33.59 kg/m2 Well nourished, well developed, in no acute distress HEENT: normal Neck: no JVD Cardiac:  normal S1, S2; RRR; no murmur; no rub Lungs:  clear to auscultation bilaterally, no wheezing, rhonchi or rales Abd: soft, nontender, no hepatomegaly Ext: no edema Skin: warm and dry Neuro:  CNs 2-12 intact, no focal abnormalities noted  EKG:  NSR, HR 92, normal axis, no acute changes     ASSESSMENT AND PLAN:  1. Chest Pain: This is quite atypical for  ischemia. She does have some exertional symptoms and she has had some improvement with nitroglycerin sublingual. She may have microvascular angina. However, overall, I doubt this. She probably has more musculoskeletal chest pain than anything else. She had a recent heart catheterization that demonstrated no significant disease. No further ischemic evaluation is warranted at this time. We could consider the addition of isosorbide. However, her low blood pressure, which is symptomatic, limits therapy somewhat. At this  point, I will obtain an echocardiogram to rule out the remote possibility she may have a pericardial effusion. I have asked her to increase Nexium to 40 mg daily. 2. CAD: Continue aspirin and statin. 3. Hypertension: Controlled. 4. ESRD: She goes to dialysis Monday, Wednesday, Friday. 5. Disposition: Follow up with me in 3-4 weeks.  Signed, Richardson Dopp, PA-C  11/28/2012 10:25 AM

## 2012-12-05 ENCOUNTER — Other Ambulatory Visit (HOSPITAL_COMMUNITY): Payer: Medicare Other

## 2012-12-07 ENCOUNTER — Ambulatory Visit (HOSPITAL_COMMUNITY): Payer: Medicare Other | Attending: Cardiovascular Disease

## 2012-12-07 DIAGNOSIS — E1149 Type 2 diabetes mellitus with other diabetic neurological complication: Secondary | ICD-10-CM | POA: Insufficient documentation

## 2012-12-07 DIAGNOSIS — R079 Chest pain, unspecified: Secondary | ICD-10-CM

## 2012-12-07 DIAGNOSIS — E1142 Type 2 diabetes mellitus with diabetic polyneuropathy: Secondary | ICD-10-CM | POA: Insufficient documentation

## 2012-12-07 DIAGNOSIS — N186 End stage renal disease: Secondary | ICD-10-CM | POA: Insufficient documentation

## 2012-12-07 DIAGNOSIS — I079 Rheumatic tricuspid valve disease, unspecified: Secondary | ICD-10-CM | POA: Insufficient documentation

## 2012-12-07 DIAGNOSIS — R072 Precordial pain: Secondary | ICD-10-CM

## 2012-12-07 DIAGNOSIS — I379 Nonrheumatic pulmonary valve disorder, unspecified: Secondary | ICD-10-CM | POA: Insufficient documentation

## 2012-12-07 DIAGNOSIS — I10 Essential (primary) hypertension: Secondary | ICD-10-CM

## 2012-12-07 DIAGNOSIS — E785 Hyperlipidemia, unspecified: Secondary | ICD-10-CM | POA: Insufficient documentation

## 2012-12-07 DIAGNOSIS — I2510 Atherosclerotic heart disease of native coronary artery without angina pectoris: Secondary | ICD-10-CM | POA: Insufficient documentation

## 2012-12-07 DIAGNOSIS — I120 Hypertensive chronic kidney disease with stage 5 chronic kidney disease or end stage renal disease: Secondary | ICD-10-CM | POA: Insufficient documentation

## 2012-12-07 DIAGNOSIS — Z992 Dependence on renal dialysis: Secondary | ICD-10-CM | POA: Insufficient documentation

## 2012-12-07 NOTE — Progress Notes (Signed)
Echocardiogram performed.  

## 2012-12-08 ENCOUNTER — Telehealth: Payer: Self-pay | Admitting: *Deleted

## 2012-12-08 ENCOUNTER — Encounter: Payer: Self-pay | Admitting: Physician Assistant

## 2012-12-08 NOTE — Telephone Encounter (Signed)
pt notified about normal echo with verbal understanding

## 2012-12-21 ENCOUNTER — Encounter: Payer: Self-pay | Admitting: Cardiovascular Disease

## 2012-12-21 ENCOUNTER — Ambulatory Visit (INDEPENDENT_AMBULATORY_CARE_PROVIDER_SITE_OTHER): Payer: Medicare Other | Admitting: Cardiovascular Disease

## 2012-12-21 VITALS — BP 106/78 | HR 98 | Ht 66.0 in | Wt 203.0 lb

## 2012-12-21 DIAGNOSIS — I959 Hypotension, unspecified: Secondary | ICD-10-CM

## 2012-12-21 MED ORDER — MIDODRINE HCL 5 MG PO TABS: 5.0000 mg | ORAL_TABLET | Freq: Three times a day (TID) | ORAL | Status: DC

## 2012-12-21 NOTE — Progress Notes (Signed)
HPI:  37 year old woman presenting for followup evaluation. She underwent cardiac catheterization in March 2014 demonstrating nonobstructive CAD. Her left ventricular ejection fraction was 60-65%. She also had a low probability VQ scan at that time.  The patient has end-stage renal disease, hypertension, hyperlipidemia, and type 2 diabetes. Her primary problem lately has been related to hypotension. She's frequently had episodes of low blood pressure and near syncope both on dialysis and nondialysis days. She has to stay at the dialysis center for 30-45 minutes after her treatment because of low blood pressure. Yesterday her pressure was as low as 69/42. She rested for a while and it came up to 104/58. She has episodic chest pain over the left chest and this has been fairly long-standing. There is no acceleration in the pattern of this. These episodes have not been related to those where she experiences low blood pressure. She denies dyspnea, orthopnea, or PND. She's had some pain in her left hip radiating down the leg. She also has had some numbness on the left side.  Outpatient Encounter Prescriptions as of 12/21/2012  Medication Sig Dispense Refill  . ACCU-CHEK AVIVA PLUS test strip Use to check blood sugar twice daily      . ACCU-CHEK SOFTCLIX LANCETS lancets Use to check blood sugar twice daily      . albuterol (PROVENTIL HFA;VENTOLIN HFA) 108 (90 BASE) MCG/ACT inhaler Inhale 2 puffs into the lungs every 6 (six) hours as needed. For shortness of breath      . aspirin 81 MG tablet Take 81 mg by mouth daily.        . B-D INS SYRINGE 0.5CC/31GX5/16 31G X 5/16" 0.5 ML MISC Use as directed once daily      . esomeprazole (NEXIUM) 20 MG capsule Take 2 capsules (40 mg total) by mouth daily before breakfast.      . gabapentin (NEURONTIN) 100 MG capsule Take 100 mg by mouth daily.      . Insulin Aspart Prot & Aspart (NOVOLOG MIX 70/30 [place]) Inject 15-20 Units into the skin 2 (two) times daily. 15 units  every morning and 20 units every evening      . linagliptin (TRADJENTA) 5 MG TABS tablet Take 5 mg by mouth at bedtime.      . multivitamin (RENA-VIT) TABS tablet Take 1 tablet by mouth daily.      . nitroGLYCERIN (NITROSTAT) 0.4 MG SL tablet Place 0.4 mg under the tongue every 5 (five) minutes as needed for chest pain.      . sevelamer carbonate (RENVELA) 800 MG tablet Take 800-1,600 mg by mouth 3 (three) times daily with meals. 1600 mg with meals and 800 mg with snacks      . simvastatin (ZOCOR) 5 MG tablet Take 5 mg by mouth at bedtime.      . sorbitol 70 % solution Take 30 mLs by mouth 2 (two) times daily as needed.      . [DISCONTINUED] amLODipine (NORVASC) 10 MG tablet Take 10 mg by mouth daily.        . [DISCONTINUED] hydrALAZINE (APRESOLINE) 25 MG tablet Take 25 mg by mouth at bedtime.      . [DISCONTINUED] labetalol (NORMODYNE) 200 MG tablet Take 200 mg by mouth 2 (two) times daily.       . [DISCONTINUED] lisinopril (PRINIVIL,ZESTRIL) 20 MG tablet Take 20 mg by mouth at bedtime.       No facility-administered encounter medications on file as of 12/21/2012.    Allergies  Allergen Reactions  . Ciprofloxacin Itching, Nausea And Vomiting and Hives  . Pineapple Itching    Makes tongue raw per patient.  . Strawberry Itching    Makes tongue raw per patient    Past Medical History  Diagnosis Date  . Asthma   . Chronic kidney disease     started dialysis 01/29/11  . Weight loss   . Loss of appetite   . CHF (congestive heart failure)   . Hypertension   . Hyperlipidemia   . Diabetes mellitus age 38  . Peripheral neuropathy   . Iron deficiency anemia   . Hyperparathyroidism, secondary   . Sciatica   . GERD (gastroesophageal reflux disease)   . Adenoma   . Gastroparesis   . Esophageal erosions     Per EGD 10/2011  . CAD (coronary artery disease)     False positive stress echo 06/2012 at [facility] - cath with mild nonobstructive CAD at [facility].  Marland Kitchen History of echocardiogram     Echo  9/14: EF 55-60%, normal wall motion, normal diastolic function, PASP 21    ROS: Negative except as per HPI  BP 106/78  Pulse 98  Ht 5\' 6"  (1.676 m)  Wt 203 lb (92.08 kg)  BMI 32.78 kg/m2  SpO2 99%  PHYSICAL EXAM: Pt is alert and oriented, pleasant woman in NAD HEENT: normal Neck: JVP - normal, carotids 2+= without bruits Lungs: CTA bilaterally CV: RRR without murmur or gallop Abd: soft, NT, Positive BS, no hepatomegaly Ext: no C/C/E, distal pulses intact and equal Skin: warm/dry no rash  ASSESSMENT AND PLAN: 1. Coronary artery disease. She has long-standing chest pain and fairly mild CAD. I suspect her symptoms are not anginal related. She should remain on aspirin and simvastatin.  2. Hypotension. Recommend a trial of Midodrine 5 mg 3 times daily. This could be increased to 10 mg by nephrology if she has a suboptimal response.  For followup I will see her in one year unless problems arise.  Sherren Mocha 12/21/2012 3:31 PM

## 2012-12-21 NOTE — Patient Instructions (Signed)
Your physician has recommended you make the following change in your medication: START Midodrine 5mg  take one by mouth three time a day  Your physician wants you to follow-up in: 1 YEAR with Dr Burt Knack.  You will receive a reminder letter in the mail two months in advance. If you don't receive a letter, please call our office to schedule the follow-up appointment.

## 2013-01-08 ENCOUNTER — Ambulatory Visit: Payer: Medicare Other | Admitting: Cardiovascular Disease

## 2013-02-07 ENCOUNTER — Other Ambulatory Visit: Payer: Self-pay | Admitting: Internal Medicine

## 2013-02-22 DIAGNOSIS — L249 Irritant contact dermatitis, unspecified cause: Secondary | ICD-10-CM | POA: Insufficient documentation

## 2013-04-10 ENCOUNTER — Ambulatory Visit (INDEPENDENT_AMBULATORY_CARE_PROVIDER_SITE_OTHER): Payer: Medicare Other | Admitting: Internal Medicine

## 2013-04-10 ENCOUNTER — Encounter: Payer: Self-pay | Admitting: Internal Medicine

## 2013-04-10 VITALS — BP 110/80 | HR 88 | Ht 66.0 in | Wt 209.0 lb

## 2013-04-10 DIAGNOSIS — K5909 Other constipation: Secondary | ICD-10-CM | POA: Insufficient documentation

## 2013-04-10 DIAGNOSIS — K5900 Constipation, unspecified: Secondary | ICD-10-CM

## 2013-04-10 MED ORDER — LINACLOTIDE 145 MCG PO CAPS: 145.0000 ug | ORAL_CAPSULE | Freq: Every day | ORAL | Status: DC

## 2013-04-10 NOTE — Patient Instructions (Signed)
Today we are giving you samples of Linzess 145 mcg, take one daily on an empty stomach.  On days you do dialysis take it after that.  Start the samples after you do the Miralax purge.  Linzess may cause your stool to be watery, do not be alarmed.   Dr Carlean Purl recommends that you complete a bowel purge (to clean out your bowels). Please do the following: Purchase a bottle of Miralax over the counter as well as a box of 5 mg dulcolax tablets. Take 4 dulcolax tablets. Wait 1 hour. You will then drink 6-8 capfuls of Miralax mixed in an adequate amount of water/juice/gatorade (you may choose which of these liquids to drink) over the next 2-3 hours. You should expect results within 1 to 6 hours after completing the bowel purge.   I appreciate the opportunity to care for you.

## 2013-04-10 NOTE — Assessment & Plan Note (Addendum)
I explained that this is common in end-stage renal disease and diabetes. She seemed to respond a MiraLax. We talked about the options and a decided to try Linzess 145 mcg daily. I will have her do a MiraLax purge first  To empty her bowels. She may need to take that most days and not on dialysis days but will try that and let me know by telephone call or my chart how things are going. Will followup as needed.  CC: Hayden Rasmussen., MD And copy Dr. Marval Regal

## 2013-04-10 NOTE — Progress Notes (Signed)
Subjective:    Patient ID: Linda Ellison, female    DOB: 27-Oct-1975, 38 y.o.   MRN: [id number]  HPI This is a very nice middle-aged African American woman with diabetes mellitus and chronic renal disease on dialysis I know from previous evaluation, here today because of chronic constipation for 5-6 months. She has had infrequent defecation going up to 2 weeks without really having any significant productive bowel movement. Her nephrologist originally recommended taking MiraLax 1 dose twice a day and then going on a daily after success. She said things worked but when she reduce to once a day didn't seem to help. Another physician put her on sorbitol, which has not really helped. She has not taken other laxatives but has used stool softeners. She is not describing bleeding. In the past she had vomiting difficulties, thought to be from gastroparesis but this is controlled.  Allergies  Allergen Reactions  . Ciprofloxacin Itching, Nausea And Vomiting and Hives  . Pineapple Itching    Makes tongue raw per patient.  . Strawberry Itching    Makes tongue raw per patient   Outpatient Prescriptions Prior to Visit  Medication Sig Dispense Refill  . ACCU-CHEK AVIVA PLUS test strip Use to check blood sugar twice daily      . ACCU-CHEK SOFTCLIX LANCETS lancets Use to check blood sugar twice daily      . albuterol (PROVENTIL HFA;VENTOLIN HFA) 108 (90 BASE) MCG/ACT inhaler Inhale 2 puffs into the lungs every 6 (six) hours as needed. For shortness of breath      . aspirin 81 MG tablet Take 81 mg by mouth daily.        . B-D INS SYRINGE 0.5CC/31GX5/16 31G X 5/16" 0.5 ML MISC Use as directed once daily      . gabapentin (NEURONTIN) 100 MG capsule Take 100 mg by mouth daily.      . Insulin Aspart Prot & Aspart (NOVOLOG MIX 70/30 [place]) Inject 15-20 Units into the skin 2 (two) times daily. 15 units every morning and 20 units every evening      . linagliptin (TRADJENTA) 5 MG TABS tablet Take 5 mg by mouth  at bedtime.      . midodrine (PROAMATINE) 5 MG tablet Take 1 tablet (5 mg total) by mouth 3 (three) times daily.  270 tablet  3  . multivitamin (RENA-VIT) TABS tablet Take 1 tablet by mouth daily.      . nitroGLYCERIN (NITROSTAT) 0.4 MG SL tablet Place 0.4 mg under the tongue every 5 (five) minutes as needed for chest pain.      Marland Kitchen omeprazole (PRILOSEC) 20 MG capsule TAKE 1 CAPSULE BY MOUTH EVERY DAY  30 capsule  1  . sevelamer carbonate (RENVELA) 800 MG tablet Take 800-1,600 mg by mouth 3 (three) times daily with meals. 1600 mg with meals and 800 mg with snacks      . simvastatin (ZOCOR) 5 MG tablet Take 5 mg by mouth at bedtime.      . sorbitol 70 % solution Take 30 mLs by mouth 2 (two) times daily as needed.      Marland Kitchen esomeprazole (NEXIUM) 20 MG capsule Take 2 capsules (40 mg total) by mouth daily before breakfast.       No facility-administered medications prior to visit.   Past Medical History  Diagnosis Date  . Asthma   . Chronic kidney disease     started dialysis 01/29/11  . Weight loss   . Loss of appetite   .  CHF (congestive heart failure)   . Hypertension   . Hyperlipidemia   . Diabetes mellitus age 38  . Peripheral neuropathy   . Iron deficiency anemia   . Hyperparathyroidism, secondary   . Sciatica   . GERD (gastroesophageal reflux disease)   . Adenoma   . Gastroparesis   . Esophageal erosions     Per EGD 10/2011  . CAD (coronary artery disease)     False positive stress echo 06/2012 at [facility] - cath with mild nonobstructive CAD at [facility].  Marland Kitchen History of echocardiogram     Echo 9/14: EF 55-60%, normal wall motion, normal diastolic function, PASP 21   Past Surgical History  Procedure Laterality Date  . Cesarean section    . Cholecystectomy  1996  . Av fistula placement  08/2010    Left radiocephalic AVF  . Ligation goretex fistula  01/04/11    Left AVF  . Colonoscopy    . Esophagogastroduodenoscopy  10/19/2011    Dr. Silvano Rusk  . Foot surgery    . Tubal ligation      History   Social History  . Marital Status: Single    Spouse Name: N/A    Number of Children: 2  . Years of Education: N/A   Occupational History  . Unemployed    Social History Main Topics  . Smoking status: Never Smoker   . Smokeless tobacco: Never Used  . Alcohol Use: No  . Drug Use: No  . Sexual Activity: None     Comment: BTL          Social History Narrative   Disabled 2/2 to dialysis   Used to be a Consulting civil engineer at [employer]   Has 2 kids   Lives in [place]   No caffeine    Family History  Problem Relation Age of Onset  . Heart disease Mother     heart attack at 2 y/o-infected valve  . Kidney disease Mother     was a dialysis patient  . Heart disease Brother   . Kidney disease Maternal Grandmother     pre-dialysis  . Kidney failure Paternal Uncle   . Kidney failure Paternal Aunt   . Diabetes Father   . Hypertension Father   . Colon cancer Neg Hx   . Arthritis Brother   . Diabetes Maternal Aunt   . Hypertension Maternal Aunt   . Diabetes Paternal Aunt   . Heart disease Paternal Aunt   . Hypertension Paternal Aunt     Review of Systems As above, dialyzes Monday Wednesday Friday they're functioning left upper extremity fistula    Objective:   Physical Exam  General:  NAD Eyes:   anicteric Heart:  S1S2 no rubs, murmurs or gallops Abdomen:  soft and nontender, BS+  Rectal exam: Female chaperone present  Anoderm inspection was normal Digital exam revealed normal resting tone and voluntary squeeze. There was formed stool in the rectal vault. No mass or rectocele present. Simulated defecation with valsalva revealed appropriate abdominal contraction and descent.   Ext:   no edema, left forearm AV fistula    Data Reviewed:  Previous endoscopy and colonoscopy and gastric emptying scan in the EMR    Assessment & Plan:   1. Chronic constipation

## 2013-04-12 DIAGNOSIS — K3184 Gastroparesis: Secondary | ICD-10-CM

## 2013-04-12 DIAGNOSIS — M7580 Other shoulder lesions, unspecified shoulder: Secondary | ICD-10-CM | POA: Insufficient documentation

## 2013-04-12 DIAGNOSIS — L309 Dermatitis, unspecified: Secondary | ICD-10-CM | POA: Insufficient documentation

## 2013-04-12 DIAGNOSIS — E1143 Type 2 diabetes mellitus with diabetic autonomic (poly)neuropathy: Secondary | ICD-10-CM | POA: Insufficient documentation

## 2013-05-02 ENCOUNTER — Encounter (HOSPITAL_COMMUNITY): Payer: Self-pay | Admitting: Emergency Medicine

## 2013-05-02 ENCOUNTER — Emergency Department (INDEPENDENT_AMBULATORY_CARE_PROVIDER_SITE_OTHER): Payer: Medicare Other

## 2013-05-02 ENCOUNTER — Emergency Department (INDEPENDENT_AMBULATORY_CARE_PROVIDER_SITE_OTHER)
Admission: EM | Admit: 2013-05-02 | Discharge: 2013-05-02 | Disposition: A | Discharge status: Home / Self Care | Payer: Medicare Other | Source: Home / Self Care | Attending: Family Medicine | Admitting: Family Medicine

## 2013-05-02 DIAGNOSIS — J40 Bronchitis, not specified as acute or chronic: Secondary | ICD-10-CM

## 2013-05-02 LAB — POCT RAPID STREP A: Streptococcus, Group A Screen (Direct): NEGATIVE

## 2013-05-02 MED ORDER — ALBUTEROL SULFATE HFA 108 (90 BASE) MCG/ACT IN AERS: 2.0000 | INHALATION_SPRAY | Freq: Four times a day (QID) | RESPIRATORY_TRACT | Status: DC | PRN

## 2013-05-02 MED ORDER — PREDNISONE 10 MG PO TABS: 30.0000 mg | ORAL_TABLET | Freq: Every day | ORAL | Status: DC

## 2013-05-02 MED ORDER — IPRATROPIUM-ALBUTEROL 0.5-2.5 (3) MG/3ML IN SOLN
RESPIRATORY_TRACT | Status: AC
  Filled 2013-05-02: qty 3

## 2013-05-02 MED ORDER — HYDROCODONE-ACETAMINOPHEN 5-325 MG PO TABS: 0.5000 | ORAL_TABLET | Freq: Every evening | ORAL | Status: DC | PRN

## 2013-05-02 MED ORDER — IPRATROPIUM-ALBUTEROL 0.5-2.5 (3) MG/3ML IN SOLN
3.0000 mL | Freq: Once | RESPIRATORY_TRACT | Status: AC
  Administered 2013-05-02: 3 mL via RESPIRATORY_TRACT

## 2013-05-02 NOTE — ED Provider Notes (Addendum)
Linda Ellison is a 38 y.o. female who presents to Urgent Care today for cough headache sore throat shortness of breath and chest pain worse with coughing. She denies any exertional chest pain. The pain feels stabbing and worse with coughing. She notes some shortness of breath worse with coughing. She's tried Tylenol. She did receive a flu vaccination this year. She notes subjective fever.  Patient has a pertinent medical history for end-stage renal disease   Past Medical History  Diagnosis Date  . Asthma   . Chronic kidney disease     started dialysis 01/29/11  . Weight loss   . Loss of appetite   . CHF (congestive heart failure)   . Hypertension   . Hyperlipidemia   . Diabetes mellitus age 39  . Peripheral neuropathy   . Iron deficiency anemia   . Hyperparathyroidism, secondary   . Sciatica   . GERD (gastroesophageal reflux disease)   . Adenoma   . Gastroparesis   . Esophageal erosions     Per EGD 10/2011  . CAD (coronary artery disease)     False positive stress echo 06/2012 at [facility] - cath with mild nonobstructive CAD at [facility]  Marland Kitchen History of echocardiogram     Echo 9/14: EF 55-60%, normal wall motion, normal diastolic function, PASP 21   History  Substance Use Topics  . Smoking status: Never Smoker   . Smokeless tobacco: Never Used  . Alcohol Use: No   ROS as above Medications: No current facility-administered medications for this encounter.   Current Outpatient Prescriptions  Medication Sig Dispense Refill  . ACCU-CHEK AVIVA PLUS test strip Use to check blood sugar twice daily      . ACCU-CHEK SOFTCLIX LANCETS lancets Use to check blood sugar twice daily      . albuterol (PROVENTIL HFA;VENTOLIN HFA) 108 (90 BASE) MCG/ACT inhaler Inhale 2 puffs into the lungs every 6 (six) hours as needed for wheezing or shortness of breath.  1 Inhaler  2  . amLODipine (NORVASC) 10 MG tablet Take 10 mg by mouth daily. qhs      . aspirin 81 MG tablet Take 81 mg by mouth daily.         . B-D INS SYRINGE 0.5CC/31GX5/16 31G X 5/16" 0.5 ML MISC Use as directed once daily      . gabapentin (NEURONTIN) 100 MG capsule Take 100 mg by mouth daily.      Marland Kitchen HYDROcodone-acetaminophen (NORCO/VICODIN) 5-325 MG per tablet Take 0.5 tablets by mouth at bedtime as needed (cough).  6 tablet  0  . Insulin Aspart Prot & Aspart (NOVOLOG MIX 70/30 [place]) Inject 15-20 Units into the skin 2 (two) times daily. 15 units every morning and 20 units every evening      . labetalol (NORMODYNE) 200 MG tablet Take 200 mg by mouth 2 (two) times daily.      . Linaclotide (LINZESS) 145 MCG CAPS capsule Take 1 capsule (145 mcg total) by mouth daily.  16 capsule  0  . linagliptin (TRADJENTA) 5 MG TABS tablet Take 5 mg by mouth at bedtime.      Marland Kitchen lisinopril (PRINIVIL,ZESTRIL) 20 MG tablet Take 20 mg by mouth daily. qhs      . midodrine (PROAMATINE) 5 MG tablet Take 1 tablet (5 mg total) by mouth 3 (three) times daily.  270 tablet  3  . multivitamin (RENA-VIT) TABS tablet Take 1 tablet by mouth daily.      . nitroGLYCERIN (NITROSTAT) 0.4 MG SL  tablet Place 0.4 mg under the tongue every 5 (five) minutes as needed for chest pain.      Marland Kitchen omeprazole (PRILOSEC) 20 MG capsule TAKE 1 CAPSULE BY MOUTH EVERY DAY  30 capsule  1  . predniSONE (DELTASONE) 10 MG tablet Take 3 tablets (30 mg total) by mouth daily.  15 tablet  0  . sevelamer carbonate (RENVELA) 800 MG tablet Take 800-1,600 mg by mouth 3 (three) times daily with meals. 1600 mg with meals and 800 mg with snacks      . simvastatin (ZOCOR) 5 MG tablet Take 5 mg by mouth at bedtime.      . sorbitol 70 % solution Take 30 mLs by mouth 2 (two) times daily as needed.        Exam:  BP 109/75  Pulse 109  Temp(Src) 98.6 F (37 C) (Oral)  Resp 18  SpO2 100%  LMP 04/22/2013  Gen: Well NAD HEENT: EOMI,  MMM Lungs: Normal work of breathing. Coarse breath sounds present bilaterally Heart: RRR no MRG Abd: NABS, Soft. NT, ND Exts: Brisk capillary refill, warm and well  perfused.   Patient was given a DuoNeb nebulizer treatment and had considerable improvement in symptoms.  Results for orders placed during the hospital encounter of 05/02/13 (from the past 24 hour(s))  POCT RAPID STREP A ([place])     Status: None   Collection Time    05/02/13  3:21 PM      Result Value Range   Streptococcus, Group A Screen (Direct) NEGATIVE  NEGATIVE   Dg Chest 2 View  05/02/2013   CLINICAL DATA:  Chest pain, fever, and shortness of breath.  EXAM: CHEST  2 VIEW  COMPARISON:  06/26/2012  FINDINGS: The heart size and mediastinal contours are within normal limits. Both lungs are clear. The visualized skeletal structures are unremarkable.  IMPRESSION: Normal exam.   Electronically Signed   By: Rozetta Nunnery M.D.   On: 05/02/2013 15:42    Assessment and Plan: 38 y.o. female with bronchitis. Plan to treat with prednisone, albuterol, hydrocodone his cough medication. Followup with nephrologist.  Discussed warning signs or symptoms. Please see discharge instructions. Patient expresses understanding.    Gregor Hams, MD 05/02/13 [place] [name], MD 05/02/13 [phone]

## 2013-05-02 NOTE — ED Notes (Signed)
C/o cold sx since saturday States productive cough, chills/fever, chest pain due to cough, SOB Tylenol was taking for the fever

## 2013-05-02 NOTE — Discharge Instructions (Signed)
Thank you for coming in today. Take prednisone daily for 5 days. Use albuterol as needed. Use hydrocodone  cough medication at bedtime as needed. Call or go to the emergency room if you get worse, have trouble breathing, have chest pains, or palpitations.   Bronchitis Bronchitis is inflammation of the airways that extend from the windpipe into the lungs (bronchi). The inflammation often causes mucus to develop, which leads to a cough. If the inflammation becomes severe, it may cause shortness of breath. CAUSES  Bronchitis may be caused by:   Viral infections.   Bacteria.   Cigarette smoke.   Allergens, pollutants, and other irritants.  SIGNS AND SYMPTOMS  The most common symptom of bronchitis is a frequent cough that produces mucus. Other symptoms include:  Fever.   Body aches.   Chest congestion.   Chills.   Shortness of breath.   Sore throat.  DIAGNOSIS  Bronchitis is usually diagnosed through a medical history and physical exam. Tests, such as chest X-rays, are sometimes done to rule out other conditions.  TREATMENT  You may need to avoid contact with whatever caused the problem (smoking, for example). Medicines are sometimes needed. These may include:  Antibiotics. These may be prescribed if the condition is caused by bacteria.  Cough suppressants. These may be prescribed for relief of cough symptoms.   Inhaled medicines. These may be prescribed to help open your airways and make it easier for you to breathe.   Steroid medicines. These may be prescribed for those with recurrent (chronic) bronchitis. HOME CARE INSTRUCTIONS  Get plenty of rest.   Drink enough fluids to keep your urine clear or pale yellow (unless you have a medical condition that requires fluid restriction). Increasing fluids may help thin your secretions and will prevent dehydration.   Only take over-the-counter or prescription medicines as directed by your health care  provider.  Only take antibiotics as directed. Make sure you finish them even if you start to feel better.  Avoid secondhand smoke, irritating chemicals, and strong fumes. These will make bronchitis worse. If you are a smoker, quit smoking. Consider using nicotine gum or skin patches to help control withdrawal symptoms. Quitting smoking will help your lungs heal faster.   Put a cool-mist humidifier in your bedroom at night to moisten the air. This may help loosen mucus. Change the water in the humidifier daily. You can also run the hot water in your shower and sit in the bathroom with the door closed for 5 10 minutes.   Follow up with your health care provider as directed.   Wash your hands frequently to avoid catching bronchitis again or spreading an infection to others.  SEEK MEDICAL CARE IF: Your symptoms do not improve after 1 week of treatment.  SEEK IMMEDIATE MEDICAL CARE IF:  Your fever increases.  You have chills.   You have chest pain.   You have worsening shortness of breath.   You have bloody sputum.  You faint.  You have lightheadedness.  You have a severe headache.   You vomit repeatedly. MAKE SURE YOU:   Understand these instructions.  Will watch your condition.  Will get help right away if you are not doing well or get worse. Document Released: 03/22/2005 Document Revised: 01/10/2013 Document Reviewed: 11/14/2012 [facility] Patient Information 2014 [place].

## 2013-05-04 ENCOUNTER — Other Ambulatory Visit: Payer: Self-pay | Admitting: Internal Medicine

## 2013-05-04 LAB — CULTURE, GROUP A STREP

## 2013-05-09 ENCOUNTER — Telehealth: Payer: Self-pay | Admitting: Cardiovascular Disease

## 2013-05-09 NOTE — Telephone Encounter (Signed)
Pt is aware of appointment with scott weaver on 05/15/13 at 11:50 AM  And symptoms get worse she is to call the office or go to the ER. Pt verbalized understanding.

## 2013-05-09 NOTE — Telephone Encounter (Signed)
Pt called while  She was in  Dialysis because  she is having chest pain for a week. Pt took a NTG SL this AM it helped some . The pain is in the middle of her chest does not radiates to neck nor left arm. Pt denies nauseas, nor cold sweats. Pt states the pain is sometimes  dull other times is sharp. Pt's  nurse in dialysis states pt's pulse is 102 beats/ minute prior pain and 119 beats/minute with pain. It was very difficult to hear pt in the dialysis due to the noise, so pt is aware that I will call her at home. An appointment was made for pt with scott Weaver PA on 05/15/13 at 11:50 AM. I called pt's cell phone [phone] and left a message to call back.

## 2013-05-09 NOTE — Telephone Encounter (Signed)
New message     Having chest pain while at [facility] a nitro tablet.

## 2013-05-15 ENCOUNTER — Ambulatory Visit: Payer: Medicare Other | Admitting: Physician Assistant

## 2013-05-22 ENCOUNTER — Ambulatory Visit: Payer: Medicare Other | Admitting: Physician Assistant

## 2013-07-24 ENCOUNTER — Ambulatory Visit: Payer: Medicare Other | Admitting: Physical Therapy

## 2013-07-25 ENCOUNTER — Ambulatory Visit: Payer: Medicare Other | Attending: Sports Medicine | Admitting: Physical Therapy

## 2013-07-25 DIAGNOSIS — M25519 Pain in unspecified shoulder: Secondary | ICD-10-CM | POA: Insufficient documentation

## 2013-07-25 DIAGNOSIS — IMO0001 Reserved for inherently not codable concepts without codable children: Secondary | ICD-10-CM | POA: Insufficient documentation

## 2013-07-30 ENCOUNTER — Ambulatory Visit: Payer: Medicare Other | Admitting: Physical Therapy

## 2013-07-30 DIAGNOSIS — IMO0001 Reserved for inherently not codable concepts without codable children: Secondary | ICD-10-CM | POA: Diagnosis not present

## 2013-08-01 ENCOUNTER — Ambulatory Visit: Payer: Medicare Other | Admitting: Physical Therapy

## 2013-08-08 ENCOUNTER — Ambulatory Visit: Payer: Medicare Other | Attending: Sports Medicine | Admitting: Physical Therapy

## 2013-08-08 DIAGNOSIS — IMO0001 Reserved for inherently not codable concepts without codable children: Secondary | ICD-10-CM | POA: Diagnosis present

## 2013-08-08 DIAGNOSIS — M25519 Pain in unspecified shoulder: Secondary | ICD-10-CM | POA: Diagnosis not present

## 2013-08-09 ENCOUNTER — Ambulatory Visit: Payer: Medicare Other | Admitting: Physical Therapy

## 2013-08-11 DIAGNOSIS — M25519 Pain in unspecified shoulder: Secondary | ICD-10-CM | POA: Insufficient documentation

## 2013-10-10 ENCOUNTER — Ambulatory Visit (INDEPENDENT_AMBULATORY_CARE_PROVIDER_SITE_OTHER): Payer: Medicare Other | Admitting: Family Medicine

## 2013-10-10 VITALS — BP 144/86 | HR 96 | Temp 98.3°F | Resp 18 | Ht 67.0 in | Wt 219.0 lb

## 2013-10-10 DIAGNOSIS — K0889 Other specified disorders of teeth and supporting structures: Secondary | ICD-10-CM

## 2013-10-10 DIAGNOSIS — K089 Disorder of teeth and supporting structures, unspecified: Secondary | ICD-10-CM

## 2013-10-10 DIAGNOSIS — K047 Periapical abscess without sinus: Secondary | ICD-10-CM

## 2013-10-10 DIAGNOSIS — K044 Acute apical periodontitis of pulpal origin: Secondary | ICD-10-CM

## 2013-10-10 MED ORDER — HYDROCODONE-ACETAMINOPHEN 5-325 MG PO TABS: ORAL_TABLET | ORAL | Status: DC

## 2013-10-10 MED ORDER — PENICILLIN V POTASSIUM 500 MG PO TABS: 500.0000 mg | ORAL_TABLET | Freq: Two times a day (BID) | ORAL | Status: DC

## 2013-10-10 NOTE — Progress Notes (Signed)
[facility] [address] [place] [address] [phone]  Date:  10/10/2013   Name:  Linda Ellison   DOB:  Dec 18, 1975   MRN:  [id number]  PCP:  Hayden Rasmussen., MD    Chief Complaint: Dental Pain   History of Present Illness:  Linda Ellison is a 38 y.o. very pleasant female patient who presents with the following:  Here today with dental pain.    History of DM1, CKD on dialysis, CHF, gastroparesis.  She had a tooth pulled on 10/04/13.  She noted onset of pain right after the tooth was pulled and is now having pain over the left side of her face and also notes a little swelling.  The tooth was pulled because it was broken; as far as she knows there was no infection in the tooth Her DDS office is closed for a week; she called but has not been able to reach them and apparently their message says they will be back next week. She sees [facility] on [address].   She is still on dialysis, goes MWF.    Patient Active Problem List   Diagnosis Date Noted  . Chronic constipation 04/10/2013  . Personal history of adenomatouscolonic polyps 10/26/2011  . Type I (juvenile type) diabetes mellitus with renal manifestations, not stated as uncontrolled 08/11/2011  . CHOLECYSTECTOMY, HX OF 12/21/2008  . HYPERLIPIDEMIA 08/08/2008  . OBESITY, UNSPECIFIED 08/08/2008  . GERD 08/08/2008  . END stage CKD on Dialysis M/W/F 08/08/2008  . CHICKENPOX, HX OF 08/08/2008  . TENSION HEADACHE 03/24/2006  . Unspecified asthma 03/24/2006  . DEPRESSION, HX OF 03/24/2006  . HYPERTENSION 04/06/1999    Past Medical History  Diagnosis Date  . Asthma   . Chronic kidney disease     started dialysis 01/29/11  . Weight loss   . Loss of appetite   . CHF (congestive heart failure)   . Hypertension   . Hyperlipidemia   . Diabetes mellitus age 20  . Peripheral neuropathy   . Iron deficiency anemia   . Hyperparathyroidism, secondary   . Sciatica   . GERD (gastroesophageal reflux disease)    . Adenoma   . Gastroparesis   . Esophageal erosions     Per EGD 10/2011  . CAD (coronary artery disease)     False positive stress echo 06/2012 at [facility] - cath with mild nonobstructive CAD at [facility].  Marland Kitchen History of echocardiogram     Echo 9/14: EF 55-60%, normal wall motion, normal diastolic function, PASP 21    Past Surgical History  Procedure Laterality Date  . Cesarean section    . Cholecystectomy  1996  . Av fistula placement  08/2010    Left radiocephalic AVF  . Ligation goretex fistula  01/04/11    Left AVF  . Colonoscopy    . Esophagogastroduodenoscopy  10/19/2011    Dr. Silvano Rusk  . Foot surgery    . Tubal ligation      History  Substance Use Topics  . Smoking status: Never Smoker   . Smokeless tobacco: Never Used  . Alcohol Use: No    Family History  Problem Relation Age of Onset  . Heart disease Mother     heart attack at 70 y/o-infected valve  . Kidney disease Mother     was a dialysis patient  . Heart disease Brother   . Kidney disease Maternal Grandmother     pre-dialysis  . Kidney failure Paternal Uncle   .  Kidney failure Paternal Aunt   . Diabetes Father   . Hypertension Father   . Colon cancer Neg Hx   . Arthritis Brother   . Diabetes Maternal Aunt   . Hypertension Maternal Aunt   . Diabetes Paternal Aunt   . Heart disease Paternal Aunt   . Hypertension Paternal Aunt     Allergies  Allergen Reactions  . Ciprofloxacin Itching, Nausea And Vomiting and Hives  . Clindamycin/Lincomycin   . Pineapple Itching    Makes tongue raw per patient.  . Strawberry Itching    Makes tongue raw per patient    Medication list has been reviewed and updated.  Current Outpatient Prescriptions on File Prior to Visit  Medication Sig Dispense Refill  . ACCU-CHEK AVIVA PLUS test strip Use to check blood sugar twice daily      . ACCU-CHEK SOFTCLIX LANCETS lancets Use to check blood sugar twice daily      . albuterol (PROVENTIL HFA;VENTOLIN HFA) 108 (90 BASE)  MCG/ACT inhaler Inhale 2 puffs into the lungs every 6 (six) hours as needed for wheezing or shortness of breath.  1 Inhaler  2  . amLODipine (NORVASC) 10 MG tablet Take 10 mg by mouth daily. qhs      . aspirin 81 MG tablet Take 81 mg by mouth daily.        . B-D INS SYRINGE 0.5CC/31GX5/16 31G X 5/16" 0.5 ML MISC Use as directed once daily      . gabapentin (NEURONTIN) 100 MG capsule Take 100 mg by mouth daily.      Marland Kitchen HYDROcodone-acetaminophen (NORCO/VICODIN) 5-325 MG per tablet Take 0.5 tablets by mouth at bedtime as needed (cough).  6 tablet  0  . Insulin Aspart Prot & Aspart (NOVOLOG MIX 70/30 [place]) Inject 15-20 Units into the skin 2 (two) times daily. 15 units every morning and 20 units every evening      . labetalol (NORMODYNE) 200 MG tablet Take 200 mg by mouth 2 (two) times daily.      Marland Kitchen linagliptin (TRADJENTA) 5 MG TABS tablet Take 5 mg by mouth at bedtime.      Marland Kitchen lisinopril (PRINIVIL,ZESTRIL) 20 MG tablet Take 20 mg by mouth daily. qhs      . midodrine (PROAMATINE) 5 MG tablet Take 1 tablet (5 mg total) by mouth 3 (three) times daily.  270 tablet  3  . multivitamin (Linda-VIT) TABS tablet Take 1 tablet by mouth daily.      . nitroGLYCERIN (NITROSTAT) 0.4 MG SL tablet Place 0.4 mg under the tongue every 5 (five) minutes as needed for chest pain.      Marland Kitchen omeprazole (PRILOSEC) 20 MG capsule Take 1 capsule (20 mg total) by mouth daily.  30 capsule  11  . sevelamer carbonate (RENVELA) 800 MG tablet Take 800-1,600 mg by mouth 3 (three) times daily with meals. 1600 mg with meals and 800 mg with snacks      . simvastatin (ZOCOR) 5 MG tablet Take 5 mg by mouth at bedtime.      . sorbitol 70 % solution Take 30 mLs by mouth 2 (two) times daily as needed.      . Linaclotide (LINZESS) 145 MCG CAPS capsule Take 1 capsule (145 mcg total) by mouth daily.  16 capsule  0  . predniSONE (DELTASONE) 10 MG tablet Take 3 tablets (30 mg total) by mouth daily.  15 tablet  0   No current facility-administered  medications on file prior to visit.  Review of Systems:  As per HPI- otherwise negative.   Physical Examination: Filed Vitals:   10/10/13 1508  BP: 144/86  Pulse: 118  Temp: 98.3 F (36.8 C)  Resp: 18   Filed Vitals:   10/10/13 1508  Height: 5\' 7"  (1.702 m)  Weight: 219 lb (99.338 kg)   Body mass index is 34.29 kg/(m^2). Ideal Body Weight: Weight in (lb) to have BMI = 25: 159.3  GEN: WDWN, NAD, Non-toxic, A & O x 3, overweight, looks well HEENT: Atraumatic, Normocephalic. Neck supple. No masses, No LAD.  Bilateral TM wnl, oropharynx normal.  PEERL,EOMI.  Her teeth are overall well- cared for and clean.  Tooth number ?11 has been pulled.  She is tender around this area on her gums, and the left cheek is minimally swollen  Ears and Nose: No external deformity. CV: RRR, No M/G/R. No JVD. No thrill. No extra heart sounds. PULM: CTA B, no wheezes, crackles, rhonchi. No retractions. No resp. distress. No accessory muscle use. EXTR: No c/c/e.  Fistula left arm NEURO Normal gait.  PSYCH: Normally interactive. Conversant. Not depressed or anxious appearing.  Calm demeanor.    Assessment and Plan: Pain, dental - Plan: HYDROcodone-acetaminophen (NORCO/VICODIN) 5-325 MG per tablet  Dental infection - Plan: penicillin v potassium (VEETID) 500 MG tablet  Dental pain and possible infection following recent extraction.  Treat with penicillin VK and vicodin as needed.  These medications are not renally adjusted so no need to change for her renal status.   However did advise her to use caution with vicodin as its effects could be more unpredictable.   See patient instructions for more details.     Signed Lamar Blinks, MD

## 2013-10-10 NOTE — Patient Instructions (Signed)
Use the penicillin as directed for your tooth infection, and the vicodin as needed for pain.  Remember the vicodin can make you feel sleepy so do not use it when you need to drive.  Go to dialysis as [place] take the penicillin after dialysis   Try to reach your dentist next week, especially if you continue to have pain

## 2013-10-29 ENCOUNTER — Emergency Department (HOSPITAL_COMMUNITY)
Admission: EM | Admit: 2013-10-29 | Discharge: 2013-10-29 | Disposition: A | Discharge status: Home / Self Care | Payer: Medicare Other | Attending: Emergency Medicine | Admitting: Emergency Medicine

## 2013-10-29 ENCOUNTER — Encounter (HOSPITAL_COMMUNITY): Payer: Self-pay | Admitting: Emergency Medicine

## 2013-10-29 ENCOUNTER — Emergency Department (HOSPITAL_COMMUNITY): Payer: Medicare Other

## 2013-10-29 DIAGNOSIS — Z3202 Encounter for pregnancy test, result negative: Secondary | ICD-10-CM | POA: Insufficient documentation

## 2013-10-29 DIAGNOSIS — R197 Diarrhea, unspecified: Secondary | ICD-10-CM | POA: Diagnosis not present

## 2013-10-29 DIAGNOSIS — Z992 Dependence on renal dialysis: Secondary | ICD-10-CM | POA: Diagnosis not present

## 2013-10-29 DIAGNOSIS — K219 Gastro-esophageal reflux disease without esophagitis: Secondary | ICD-10-CM | POA: Insufficient documentation

## 2013-10-29 DIAGNOSIS — N186 End stage renal disease: Secondary | ICD-10-CM | POA: Diagnosis not present

## 2013-10-29 DIAGNOSIS — R079 Chest pain, unspecified: Secondary | ICD-10-CM | POA: Diagnosis present

## 2013-10-29 DIAGNOSIS — I120 Hypertensive chronic kidney disease with stage 5 chronic kidney disease or end stage renal disease: Secondary | ICD-10-CM | POA: Diagnosis not present

## 2013-10-29 DIAGNOSIS — I2510 Atherosclerotic heart disease of native coronary artery without angina pectoris: Secondary | ICD-10-CM | POA: Diagnosis not present

## 2013-10-29 DIAGNOSIS — J45901 Unspecified asthma with (acute) exacerbation: Secondary | ICD-10-CM | POA: Insufficient documentation

## 2013-10-29 DIAGNOSIS — Z8669 Personal history of other diseases of the nervous system and sense organs: Secondary | ICD-10-CM | POA: Diagnosis not present

## 2013-10-29 DIAGNOSIS — I509 Heart failure, unspecified: Secondary | ICD-10-CM | POA: Diagnosis not present

## 2013-10-29 DIAGNOSIS — E119 Type 2 diabetes mellitus without complications: Secondary | ICD-10-CM | POA: Insufficient documentation

## 2013-10-29 DIAGNOSIS — Z7982 Long term (current) use of aspirin: Secondary | ICD-10-CM | POA: Insufficient documentation

## 2013-10-29 DIAGNOSIS — Z862 Personal history of diseases of the blood and blood-forming organs and certain disorders involving the immune mechanism: Secondary | ICD-10-CM | POA: Insufficient documentation

## 2013-10-29 DIAGNOSIS — Z792 Long term (current) use of antibiotics: Secondary | ICD-10-CM | POA: Diagnosis not present

## 2013-10-29 DIAGNOSIS — E875 Hyperkalemia: Secondary | ICD-10-CM | POA: Diagnosis not present

## 2013-10-29 DIAGNOSIS — Z79899 Other long term (current) drug therapy: Secondary | ICD-10-CM | POA: Insufficient documentation

## 2013-10-29 DIAGNOSIS — E785 Hyperlipidemia, unspecified: Secondary | ICD-10-CM | POA: Insufficient documentation

## 2013-10-29 LAB — CBC
HEMATOCRIT: 33.2 % — AB (ref 36.0–46.0)
Hemoglobin: 10.5 g/dL — ABNORMAL LOW (ref 12.0–15.0)
MCH: 27.9 pg (ref 26.0–34.0)
MCHC: 31.6 g/dL (ref 30.0–36.0)
MCV: 88.1 fL (ref 78.0–100.0)
Platelets: 246 10*3/uL (ref 150–400)
RBC: 3.77 MIL/uL — ABNORMAL LOW (ref 3.87–5.11)
RDW: 13.8 % (ref 11.5–15.5)
WBC: 7.1 10*3/uL (ref 4.0–10.5)

## 2013-10-29 LAB — BASIC METABOLIC PANEL
Anion gap: 16 — ABNORMAL HIGH (ref 5–15)
BUN: 58 mg/dL — AB (ref 6–23)
CHLORIDE: 100 meq/L (ref 96–112)
CO2: 21 meq/L (ref 19–32)
CREATININE: 7.57 mg/dL — AB (ref 0.50–1.10)
Calcium: 9.4 mg/dL (ref 8.4–10.5)
GFR calc Af Amer: 7 mL/min — ABNORMAL LOW (ref >90)
GFR calc non Af Amer: 6 mL/min — ABNORMAL LOW (ref >90)
Glucose, Bld: 207 mg/dL — ABNORMAL HIGH (ref 70–99)
Potassium: 5.5 mEq/L — ABNORMAL HIGH (ref 3.7–5.3)
Sodium: 137 mEq/L (ref 137–147)

## 2013-10-29 LAB — I-STAT TROPONIN, ED
Troponin i, poc: 0 ng/mL (ref 0.00–0.08)
Troponin i, poc: 0 ng/mL (ref 0.00–0.08)

## 2013-10-29 LAB — POC URINE PREG, ED: PREG TEST UR: NEGATIVE

## 2013-10-29 MED ORDER — SODIUM POLYSTYRENE SULFONATE 15 GM/60ML PO SUSP
30.0000 g | Freq: Once | ORAL | Status: AC
Start: 2013-10-29 — End: 2013-10-29
  Administered 2013-10-29: 30 g via ORAL
  Filled 2013-10-29: qty 120

## 2013-10-29 NOTE — ED Provider Notes (Signed)
CSN: [id number]     Arrival date & time 10/29/13  1547 History   First MD Initiated Contact with Patient 10/29/13 1820     Chief Complaint  Patient presents with  . Chest Pain    The paitent said she has been haivng chest pain since thursday and has been having diarrhea since saturday.      (Consider location/radiation/quality/duration/timing/severity/associated sxs/prior Treatment) Patient is a 38 y.o. female presenting with chest pain. The history is provided by the patient and medical records.  Chest Pain Associated symptoms: shortness of breath    This is a 38 year old female with past medical history significant for hypertension, hyperlipidemia, diabetes, congestive heart failure, CAD, end-stage renal disease on hemodialysis, presenting to the ED for chest pain. Patient states that chest pain began on Friday while she was getting her dialysis treatment. She states it was localized to the left side of her chest seemingly surrounding her breast.  States was intermittent at first, but has been constant throughout the day today, lasting for the past several hours. She endorses some mild shortness of breath, mostly with exertion, and generalized weakness.  Denies alleviating or exacerbating factors.  She's also been having some nonbloody diarrhea, estimates approximately 10 episodes today. She denies any fever, chills, recent travel, or antibiotic use.  Dialysis schedule is Monday, Wednesday, and Friday, however she did not attend dialysis today because she was not feeling well.   Last 2-D echo in 2014 with estimated EF of 55-60%. LHC on 06/27/12 without obstructive CAD.  Patient is followed regularly by cardiology, Dr. Burt Knack, she is not scheduled to see him until October.  VS stable on arrival.  Past Medical History  Diagnosis Date  . Asthma   . Chronic kidney disease     started dialysis 01/29/11  . Weight loss   . Loss of appetite   . CHF (congestive heart failure)   . Hypertension   .  Hyperlipidemia   . Diabetes mellitus age 67  . Peripheral neuropathy   . Iron deficiency anemia   . Hyperparathyroidism, secondary   . Sciatica   . GERD (gastroesophageal reflux disease)   . Adenoma   . Gastroparesis   . Esophageal erosions     Per EGD 10/2011  . CAD (coronary artery disease)     False positive stress echo 06/2012 at [facility] - cath with mild nonobstructive CAD at [facility].  Marland Kitchen History of echocardiogram     Echo 9/14: EF 55-60%, normal wall motion, normal diastolic function, PASP 21   Past Surgical History  Procedure Laterality Date  . Cesarean section    . Cholecystectomy  1996  . Av fistula placement  08/2010    Left radiocephalic AVF  . Ligation goretex fistula  01/04/11    Left AVF  . Colonoscopy    . Esophagogastroduodenoscopy  10/19/2011    Dr. Silvano Rusk  . Foot surgery    . Tubal ligation     Family History  Problem Relation Age of Onset  . Heart disease Mother     heart attack at 58 y/o-infected valve  . Kidney disease Mother     was a dialysis patient  . Heart disease Brother   . Kidney disease Maternal Grandmother     pre-dialysis  . Kidney failure Paternal Uncle   . Kidney failure Paternal Aunt   . Diabetes Father   . Hypertension Father   . Colon cancer Neg Hx   . Arthritis Brother   .  Diabetes Maternal Aunt   . Hypertension Maternal Aunt   . Diabetes Paternal Aunt   . Heart disease Paternal Aunt   . Hypertension Paternal Aunt    History  Substance Use Topics  . Smoking status: Never Smoker   . Smokeless tobacco: Never Used  . Alcohol Use: No   OB History   Grav Para Term Preterm Abortions TAB SAB Ect Mult Living                 Review of Systems  Respiratory: Positive for shortness of breath.   Cardiovascular: Positive for chest pain.  Gastrointestinal: Positive for diarrhea.  All other systems reviewed and are negative.     Allergies  Ciprofloxacin; Clindamycin/lincomycin; Pineapple; and Strawberry  Home Medications    Prior to Admission medications   Medication Sig Start Date End Date Taking? Authorizing Provider  albuterol (PROVENTIL HFA;VENTOLIN HFA) 108 (90 BASE) MCG/ACT inhaler Inhale 2 puffs into the lungs every 6 (six) hours as needed for wheezing or shortness of breath.   Yes Historical Provider, MD  amLODipine (NORVASC) 10 MG tablet Take 10 mg by mouth daily. qhs   Yes Historical Provider, MD  aspirin 81 MG tablet Take 81 mg by mouth daily.     Yes Historical Provider, MD  gabapentin (NEURONTIN) 100 MG capsule Take 100 mg by mouth daily.   Yes Historical Provider, MD  Insulin Aspart Prot & Aspart (NOVOLOG MIX 70/30 [place]) Inject 15-20 Units into the skin 2 (two) times daily. 15 units every morning and 20 units every evening   Yes Historical Provider, MD  labetalol (NORMODYNE) 200 MG tablet Take 200 mg by mouth 2 (two) times daily.   Yes Historical Provider, MD  Linaclotide Rolan Lipa) 145 MCG CAPS capsule Take 145 mcg by mouth daily.   Yes Historical Provider, MD  linagliptin (TRADJENTA) 5 MG TABS tablet Take 5 mg by mouth at bedtime.   Yes Historical Provider, MD  lisinopril (PRINIVIL,ZESTRIL) 20 MG tablet Take 20 mg by mouth daily. qhs   Yes Historical Provider, MD  midodrine (PROAMATINE) 5 MG tablet Take 1 tablet (5 mg total) by mouth 3 (three) times daily. 12/21/12  Yes Sherren Mocha, MD  multivitamin (RENA-VIT) TABS tablet Take 1 tablet by mouth daily.   Yes Historical Provider, MD  nitroGLYCERIN (NITROSTAT) 0.4 MG SL tablet Place 0.4 mg under the tongue every 5 (five) minutes as needed for chest pain.   Yes Historical Provider, MD  omeprazole (PRILOSEC) 20 MG capsule Take 20 mg by mouth daily.   Yes Historical Provider, MD  sevelamer carbonate (RENVELA) 800 MG tablet Take 800-1,600 mg by mouth 3 (three) times daily with meals. 1600 mg with meals and 800 mg with snacks   Yes Historical Provider, MD  simvastatin (ZOCOR) 5 MG tablet Take 5 mg by mouth at bedtime.   Yes Historical Provider, MD   penicillin v potassium (VEETID) 500 MG tablet Take 1 tablet (500 mg total) by mouth 2 (two) times daily. 10/10/13   Gay Filler Copland, MD   BP 131/75  Pulse 94  Resp 18  SpO2 99%  LMP 10/01/2013  Physical Exam  Nursing note and vitals reviewed. Constitutional: She is oriented to person, place, and time. She appears well-developed and well-nourished. No distress.  HENT:  Head: Normocephalic and atraumatic.  Mouth/Throat: Oropharynx is clear and moist.  Eyes: Conjunctivae and EOM are normal. Pupils are equal, round, and reactive to light.  Neck: Normal range of motion. Neck supple.  Cardiovascular: Normal  rate, regular rhythm and normal heart sounds.   Pulmonary/Chest: Effort normal and breath sounds normal. No respiratory distress. She has no wheezes.  Abdominal: Soft. Bowel sounds are normal. There is no tenderness. There is no guarding.  Musculoskeletal: Normal range of motion. She exhibits no edema.  LUE AV fistula, strong thrill present  Neurological: She is alert and oriented to person, place, and time.  Skin: Skin is warm and dry. She is not diaphoretic.  Psychiatric: She has a normal mood and affect.    ED Course  Procedures (including critical care time) Labs Review Labs Reviewed  CBC - Abnormal; Notable for the following:    RBC 3.77 (*)    Hemoglobin 10.5 (*)    HCT 33.2 (*)    All other components within normal limits  BASIC METABOLIC PANEL - Abnormal; Notable for the following:    Potassium 5.5 (*)    Glucose, Bld 207 (*)    BUN 58 (*)    Creatinine, Ser 7.57 (*)    GFR calc non Af Amer 6 (*)    GFR calc Af Amer 7 (*)    Anion gap 16 (*)    All other components within normal limits  I-STAT TROPOININ, ED  POC URINE PREG, ED  Randolm Idol, ED    Imaging Review Dg Chest 2 View  10/29/2013   CLINICAL DATA:  Chest pain.  EXAM: CHEST  2 VIEW  COMPARISON:  05/02/2013  FINDINGS: Heart size is normal. There is slight distention of the azygos vein in the  pulmonary vascularity is slightly prominent. Lungs are clear. No effusions. No osseous abnormality.  IMPRESSION: Slight pulmonary vascular prominence.   Electronically Signed   By: Rozetta Nunnery M.D.   On: 10/29/2013 20:34     EKG Interpretation   Date/Time:  Monday October 29 2013 16:00:27 EDT Ventricular Rate:  102 PR Interval:  138 QRS Duration: 72 QT Interval:  344 QTC Calculation: 448 R Axis:   3 Text Interpretation:  Sinus tachycardia Otherwise normal ECG Sinus  tachycardia Artifact Borderline ECG Confirmed by Carmin Muskrat  MD  ([id number]) on 10/29/2013 8:08:36 PM      MDM   Final diagnoses:  Chest pain, unspecified chest pain type  Hyperkalemia  ESRD on hemodialysis   38 y.o. M with chest pain over the past several days, intermittent at first but constant throughout the day today.  Non-occlusive cath in 2014, followed by Dr. Burt Knack.  On exam, pt in NAD.  EKG obtained, low grade sinus tachycardia at 102 without peaked t-waves, HR stabilized by time of exam.  Labs and CXR pending at this time.  Pt has already taken ASA today.  Labs with mild hyperkalemia at 5.5, remainder of labs appear baseline for patient and does not appear that she needs emergent dialysis at this time. Troponin negative.  CXR with slight pulmonary vascular prominence.  Patients sx atypical but she does have known cardiac disease and cardiac RF.  Will obtain delta trop.    Delta trop negative after several hours of constant pain today.  Pt PERC negative.  Low suspicion for ACS, PE, dissection, or other acute cardiac event at this time and feel patient is safe for discharge home.  30mg  kayexalate given for hyperkalemia-- Pt instructed to call her dialysis center tomorrow morning to try and schedule make-up session for tomorrow.  Also instructed to contact Dr. Burt Knack to schedule earlier appt than her previously scheduled one for October. Discussed plan with patient, he/she acknowledged  understanding and agreed with plan  of care.  Return precautions given for new or worsening symptoms.  Discussed with attending physician, Dr. Vanita Panda, who agrees with assessment and plan of care.  Larene Pickett, PA-C 10/29/13 2320

## 2013-10-29 NOTE — Discharge Instructions (Signed)
Call you dialysis center tomorrow morning to schedule make-up session. Follow-up with Dr. Burt Knack-- may wish to get an earlier appt than your scheduled one in October. Return to the ED for new concerns.

## 2013-10-29 NOTE — ED Notes (Signed)
The paitent said she has been haivng chest pain since thursday and has been having diarrhea since saturday. The patient said she started having chest pain with Dialysis on Friday and was having diarrhea.  On Saturday she woke up with chest pain and took one Nitro and it helped her pain.  Today, she presents with chest pain SOB, dizziness,weakness. She has had 10 instances of diarrhea today.  She did take a baby aspirin.

## 2013-10-30 NOTE — ED Provider Notes (Signed)
  Medical screening examination/treatment/procedure(s) were performed by non-physician practitioner and as supervising physician I was immediately available for consultation/collaboration.   EKG Interpretation   Date/Time:  Monday October 29 2013 16:00:27 EDT Ventricular Rate:  102 PR Interval:  138 QRS Duration: 72 QT Interval:  344 QTC Calculation: 448 R Axis:   3 Text Interpretation:  Sinus tachycardia Otherwise normal ECG Sinus  tachycardia Artifact Borderline ECG Confirmed by Carmin Muskrat  MD  ([id number]) on 10/29/2013 8:08:36 PM         Carmin Muskrat, MD 10/30/13 [phone]

## 2013-12-17 ENCOUNTER — Encounter (HOSPITAL_COMMUNITY): Payer: Self-pay | Admitting: Emergency Medicine

## 2013-12-17 ENCOUNTER — Emergency Department (HOSPITAL_COMMUNITY)
Admission: EM | Admit: 2013-12-17 | Discharge: 2013-12-17 | Disposition: A | Discharge status: Home / Self Care | Payer: Medicare Other | Attending: Emergency Medicine | Admitting: Emergency Medicine

## 2013-12-17 ENCOUNTER — Emergency Department (HOSPITAL_COMMUNITY): Payer: Medicare Other

## 2013-12-17 DIAGNOSIS — E119 Type 2 diabetes mellitus without complications: Secondary | ICD-10-CM | POA: Insufficient documentation

## 2013-12-17 DIAGNOSIS — Z992 Dependence on renal dialysis: Secondary | ICD-10-CM | POA: Insufficient documentation

## 2013-12-17 DIAGNOSIS — R0789 Other chest pain: Secondary | ICD-10-CM | POA: Insufficient documentation

## 2013-12-17 DIAGNOSIS — G609 Hereditary and idiopathic neuropathy, unspecified: Secondary | ICD-10-CM | POA: Insufficient documentation

## 2013-12-17 DIAGNOSIS — N186 End stage renal disease: Secondary | ICD-10-CM | POA: Insufficient documentation

## 2013-12-17 DIAGNOSIS — Z7982 Long term (current) use of aspirin: Secondary | ICD-10-CM | POA: Diagnosis not present

## 2013-12-17 DIAGNOSIS — I2510 Atherosclerotic heart disease of native coronary artery without angina pectoris: Secondary | ICD-10-CM | POA: Diagnosis not present

## 2013-12-17 DIAGNOSIS — J45909 Unspecified asthma, uncomplicated: Secondary | ICD-10-CM | POA: Insufficient documentation

## 2013-12-17 DIAGNOSIS — I509 Heart failure, unspecified: Secondary | ICD-10-CM | POA: Insufficient documentation

## 2013-12-17 DIAGNOSIS — Z862 Personal history of diseases of the blood and blood-forming organs and certain disorders involving the immune mechanism: Secondary | ICD-10-CM | POA: Diagnosis not present

## 2013-12-17 DIAGNOSIS — Z87898 Personal history of other specified conditions: Secondary | ICD-10-CM | POA: Diagnosis not present

## 2013-12-17 DIAGNOSIS — R55 Syncope and collapse: Secondary | ICD-10-CM | POA: Insufficient documentation

## 2013-12-17 DIAGNOSIS — E785 Hyperlipidemia, unspecified: Secondary | ICD-10-CM | POA: Diagnosis not present

## 2013-12-17 DIAGNOSIS — R079 Chest pain, unspecified: Secondary | ICD-10-CM | POA: Insufficient documentation

## 2013-12-17 DIAGNOSIS — I120 Hypertensive chronic kidney disease with stage 5 chronic kidney disease or end stage renal disease: Secondary | ICD-10-CM | POA: Diagnosis not present

## 2013-12-17 DIAGNOSIS — Z79899 Other long term (current) drug therapy: Secondary | ICD-10-CM | POA: Insufficient documentation

## 2013-12-17 DIAGNOSIS — K219 Gastro-esophageal reflux disease without esophagitis: Secondary | ICD-10-CM | POA: Insufficient documentation

## 2013-12-17 LAB — CBC
HCT: 35.8 % — ABNORMAL LOW (ref 36.0–46.0)
Hemoglobin: 11.6 g/dL — ABNORMAL LOW (ref 12.0–15.0)
MCH: 27.6 pg (ref 26.0–34.0)
MCHC: 32.4 g/dL (ref 30.0–36.0)
MCV: 85.2 fL (ref 78.0–100.0)
PLATELETS: 216 10*3/uL (ref 150–400)
RBC: 4.2 MIL/uL (ref 3.87–5.11)
RDW: 13.9 % (ref 11.5–15.5)
WBC: 7.6 10*3/uL (ref 4.0–10.5)

## 2013-12-17 LAB — COMPREHENSIVE METABOLIC PANEL
ALBUMIN: 3.9 g/dL (ref 3.5–5.2)
ALT: 17 U/L (ref 0–35)
AST: 15 U/L (ref 0–37)
Alkaline Phosphatase: 77 U/L (ref 39–117)
Anion gap: 14 (ref 5–15)
BUN: 18 mg/dL (ref 6–23)
CHLORIDE: 96 meq/L (ref 96–112)
CO2: 29 mEq/L (ref 19–32)
CREATININE: 4.23 mg/dL — AB (ref 0.50–1.10)
Calcium: 8.8 mg/dL (ref 8.4–10.5)
GFR calc Af Amer: 14 mL/min — ABNORMAL LOW (ref >90)
GFR calc non Af Amer: 12 mL/min — ABNORMAL LOW (ref >90)
Glucose, Bld: 289 mg/dL — ABNORMAL HIGH (ref 70–99)
POTASSIUM: 4.1 meq/L (ref 3.7–5.3)
SODIUM: 139 meq/L (ref 137–147)
Total Bilirubin: 0.5 mg/dL (ref 0.3–1.2)
Total Protein: 7.7 g/dL (ref 6.0–8.3)

## 2013-12-17 LAB — I-STAT TROPONIN, ED: TROPONIN I, POC: 0 ng/mL (ref 0.00–0.08)

## 2013-12-17 NOTE — ED Notes (Signed)
Per EMS pt from dialysis center, completed dialysis and witnessed fall to ground with no known injuries. Pt did not have LOC but does not recall event. Patient endorses left sided non-radiating CP that started last night 8/10. Skin warm and dry, radial pulses 2+. Pt alert and oriented x4. 324 ASA given.

## 2013-12-17 NOTE — Discharge Instructions (Signed)
As discussed, your evaluation today has been largely reassuring.  But, it is important that you monitor your condition carefully, and do not hesitate to return to the ED if you develop new, or concerning changes in your condition.  Please be sure to discuss cardiac monitoring, Holter Monitor, with your physician.

## 2013-12-17 NOTE — ED Notes (Signed)
Per Pt. Request. Pt. PCP [facility] called to inform pt is in hospital and cancel appt today. Facility made aware.

## 2013-12-17 NOTE — ED Provider Notes (Signed)
CSN: [id number]     Arrival date & time 12/17/13  1224 History   First MD Initiated Contact with Patient 12/17/13 1237     Chief Complaint  Patient presents with  . Chest Pain     (Consider location/radiation/quality/duration/timing/severity/associated sxs/prior Treatment) HPI Patient presents after an episode of possible syncope. Patient was at dialysis, experiencing chest pain, which she began to have the night prior. After dialysis, the patient had an episode of non-prodromal possible syncope. Intermittent chest pain has been going on for a long time, with no characteristic changes today and no exacerbation either before or after her event. Patient has previously been seen by cardiology. Currently the patient has no chest pain, has mild upset stomach without focal pain, no vomiting, no fever, no dyspnea.   Past Medical History  Diagnosis Date  . Asthma   . Chronic kidney disease     started dialysis 01/29/11  . Weight loss   . Loss of appetite   . CHF (congestive heart failure)   . Hypertension   . Hyperlipidemia   . Diabetes mellitus age 82  . Peripheral neuropathy   . Iron deficiency anemia   . Hyperparathyroidism, secondary   . Sciatica   . GERD (gastroesophageal reflux disease)   . Adenoma   . Gastroparesis   . Esophageal erosions     Per EGD 10/2011  . CAD (coronary artery disease)     False positive stress echo 06/2012 at [facility] - cath with mild nonobstructive CAD at [facility].  Marland Kitchen History of echocardiogram     Echo 9/14: EF 55-60%, normal wall motion, normal diastolic function, PASP 21   Past Surgical History  Procedure Laterality Date  . Cesarean section    . Cholecystectomy  1996  . Av fistula placement  08/2010    Left radiocephalic AVF  . Ligation goretex fistula  01/04/11    Left AVF  . Colonoscopy    . Esophagogastroduodenoscopy  10/19/2011    Dr. Silvano Rusk  . Foot surgery    . Tubal ligation     Family History  Problem Relation Age of Onset  .  Heart disease Mother     heart attack at 57 y/o-infected valve  . Kidney disease Mother     was a dialysis patient  . Heart disease Brother   . Kidney disease Maternal Grandmother     pre-dialysis  . Kidney failure Paternal Uncle   . Kidney failure Paternal Aunt   . Diabetes Father   . Hypertension Father   . Colon cancer Neg Hx   . Arthritis Brother   . Diabetes Maternal Aunt   . Hypertension Maternal Aunt   . Diabetes Paternal Aunt   . Heart disease Paternal Aunt   . Hypertension Paternal Aunt    History  Substance Use Topics  . Smoking status: Never Smoker   . Smokeless tobacco: Never Used  . Alcohol Use: No   OB History   Grav Para Term Preterm Abortions TAB SAB Ect Mult Living                 Review of Systems  Constitutional:       Per HPI, otherwise negative  HENT:       Per HPI, otherwise negative  Respiratory:       Per HPI, otherwise negative  Cardiovascular:       Per HPI, otherwise negative  Gastrointestinal: Negative for vomiting.  Endocrine:       Negative aside  from HPI  Genitourinary:       Neg aside from HPI   Musculoskeletal:       Per HPI, otherwise negative  Skin: Negative.   Neurological: Positive for syncope and weakness.      Allergies  Ciprofloxacin; Clindamycin/lincomycin; Pineapple; and Strawberry  Home Medications   Prior to Admission medications   Medication Sig Start Date End Date Taking? Authorizing Provider  albuterol (PROVENTIL HFA;VENTOLIN HFA) 108 (90 BASE) MCG/ACT inhaler Inhale 2 puffs into the lungs every 6 (six) hours as needed for wheezing or shortness of breath.    Historical Provider, MD  amLODipine (NORVASC) 10 MG tablet Take 10 mg by mouth daily. qhs    Historical Provider, MD  aspirin 81 MG tablet Take 81 mg by mouth daily.      Historical Provider, MD  gabapentin (NEURONTIN) 100 MG capsule Take 100 mg by mouth daily.    Historical Provider, MD  Insulin Aspart Prot & Aspart (NOVOLOG MIX 70/30 [place]) Inject 15-20  Units into the skin 2 (two) times daily. 15 units every morning and 20 units every evening    Historical Provider, MD  labetalol (NORMODYNE) 200 MG tablet Take 200 mg by mouth 2 (two) times daily.    Historical Provider, MD  linagliptin (TRADJENTA) 5 MG TABS tablet Take 5 mg by mouth at bedtime.    Historical Provider, MD  lisinopril (PRINIVIL,ZESTRIL) 20 MG tablet Take 20 mg by mouth daily. qhs    Historical Provider, MD  midodrine (PROAMATINE) 5 MG tablet Take 1 tablet (5 mg total) by mouth 3 (three) times daily. 12/21/12   Sherren Mocha, MD  multivitamin (RENA-VIT) TABS tablet Take 1 tablet by mouth daily.    Historical Provider, MD  nitroGLYCERIN (NITROSTAT) 0.4 MG SL tablet Place 0.4 mg under the tongue every 5 (five) minutes as needed for chest pain.    Historical Provider, MD  omeprazole (PRILOSEC) 20 MG capsule Take 20 mg by mouth daily.    Historical Provider, MD  sevelamer carbonate (RENVELA) 800 MG tablet Take 800-1,600 mg by mouth 3 (three) times daily with meals. 1600 mg with meals and 800 mg with snacks    Historical Provider, MD  simvastatin (ZOCOR) 5 MG tablet Take 5 mg by mouth at bedtime.    Historical Provider, MD   BP 112/69  Pulse 96  Temp(Src) 97.8 F (36.6 C) (Oral)  Resp 16  Ht 5' 6.5" (1.689 m)  Wt 215 lb (97.523 kg)  BMI 34.19 kg/m2  SpO2 100%  LMP 12/17/2013 Physical Exam  Nursing note and vitals reviewed. Constitutional: She is oriented to person, place, and time. She appears well-developed and well-nourished. No distress.  HENT:  Head: Normocephalic and atraumatic.  Eyes: Conjunctivae and EOM are normal.  Cardiovascular: Normal rate and regular rhythm.   Pulmonary/Chest: Effort normal and breath sounds normal. No stridor. No respiratory distress.  Abdominal: She exhibits no distension. There is no tenderness.  Musculoskeletal: She exhibits no edema.  Neurological: She is alert and oriented to person, place, and time. No cranial nerve deficit.  Skin: Skin  is warm and dry.  Psychiatric: She has a normal mood and affect.    ED Course  Procedures (including critical care time) Labs Review Labs Reviewed  CBC - Abnormal; Notable for the following:    Hemoglobin 11.6 (*)    HCT 35.8 (*)    All other components within normal limits  COMPREHENSIVE METABOLIC PANEL - Abnormal; Notable for the following:    Glucose,  Bld 289 (*)    Creatinine, Ser 4.23 (*)    GFR calc non Af Amer 12 (*)    GFR calc Af Amer 14 (*)    All other components within normal limits  I-STAT TROPOININ, ED    Imaging Review Dg Chest Portable 1 View  12/17/2013   CLINICAL DATA:  Chest pain.  EXAM: PORTABLE CHEST - 1 VIEW  COMPARISON:  October 29, 2013.  FINDINGS: The heart size and mediastinal contours are within normal limits. Both lungs are clear. No pneumothorax or pleural effusion is noted. The visualized skeletal structures are unremarkable.  IMPRESSION: No acute cardiopulmonary abnormality seen.   Electronically Signed   By: Sabino Dick M.D.   On: 12/17/2013 13:30     EKG Interpretation   Date/Time:  Monday December 17 2013 12:27:53 EDT Ventricular Rate:  93 PR Interval:  143 QRS Duration: 86 QT Interval:  403 QTC Calculation: 501 R Axis:   27 Text Interpretation:  Sinus rhythm Borderline prolonged QT interval  Baseline wander in lead(s) V4 Sinus rhythm Artifact QT prolonged Abnormal  ekg Confirmed by Carmin Muskrat  MD [phone]) on 12/17/2013 12:38:32 PM     3:36 PM Patient is awake, alert, smiling on repeat exam.  MDM   Patient presents after a possible syncope episode with chronic chest pain. The patient's exam, monitoring.  In the emergency department, she is hemodynamically stable, in no distress. Patient's event is unclear, and what may represent syncope, the patient is hemodynamically stable, with no evidence of arrhythmia on several hours of monitoring her. Patient does have mild chronic chest pain, but denies any change in the care for 6 of her  pain, and with negative troponin, non-ischemic EKG, there is low suspicion for ongoing ischemia. Patient has no dyspnea, evidence for fluid overload status.  The after hours of monitoring, with no decompensation, she discharged in stable condition to follow up with primary care for consideration of Holter monitoring, and additional evaluation, management.   Carmin Muskrat, MD 12/17/13 1538

## 2013-12-27 ENCOUNTER — Encounter: Payer: Self-pay | Admitting: *Deleted

## 2013-12-27 ENCOUNTER — Telehealth: Payer: Self-pay | Admitting: *Deleted

## 2013-12-27 ENCOUNTER — Ambulatory Visit (HOSPITAL_COMMUNITY): Payer: Medicare Other | Attending: Physician Assistant | Admitting: Radiology

## 2013-12-27 ENCOUNTER — Encounter (HOSPITAL_BASED_OUTPATIENT_CLINIC_OR_DEPARTMENT_OTHER): Payer: Medicare Other

## 2013-12-27 ENCOUNTER — Encounter: Payer: Self-pay | Admitting: Physician Assistant

## 2013-12-27 ENCOUNTER — Ambulatory Visit (INDEPENDENT_AMBULATORY_CARE_PROVIDER_SITE_OTHER): Payer: Medicare Other | Admitting: Physician Assistant

## 2013-12-27 VITALS — BP 122/64 | HR 101 | Ht 66.5 in | Wt 229.0 lb

## 2013-12-27 DIAGNOSIS — I2510 Atherosclerotic heart disease of native coronary artery without angina pectoris: Secondary | ICD-10-CM

## 2013-12-27 DIAGNOSIS — G8929 Other chronic pain: Secondary | ICD-10-CM

## 2013-12-27 DIAGNOSIS — I10 Essential (primary) hypertension: Secondary | ICD-10-CM | POA: Diagnosis not present

## 2013-12-27 DIAGNOSIS — E119 Type 2 diabetes mellitus without complications: Secondary | ICD-10-CM | POA: Insufficient documentation

## 2013-12-27 DIAGNOSIS — R079 Chest pain, unspecified: Principal | ICD-10-CM

## 2013-12-27 DIAGNOSIS — I951 Orthostatic hypotension: Secondary | ICD-10-CM

## 2013-12-27 DIAGNOSIS — E669 Obesity, unspecified: Secondary | ICD-10-CM | POA: Insufficient documentation

## 2013-12-27 DIAGNOSIS — N186 End stage renal disease: Secondary | ICD-10-CM

## 2013-12-27 DIAGNOSIS — R55 Syncope and collapse: Secondary | ICD-10-CM

## 2013-12-27 DIAGNOSIS — Z992 Dependence on renal dialysis: Secondary | ICD-10-CM

## 2013-12-27 DIAGNOSIS — E785 Hyperlipidemia, unspecified: Secondary | ICD-10-CM | POA: Diagnosis not present

## 2013-12-27 DIAGNOSIS — I498 Other specified cardiac arrhythmias: Secondary | ICD-10-CM

## 2013-12-27 DIAGNOSIS — R000 Tachycardia, unspecified: Secondary | ICD-10-CM

## 2013-12-27 LAB — T4, FREE: Free T4: 5.77 ng/dL — ABNORMAL HIGH (ref 0.60–1.60)

## 2013-12-27 LAB — TSH: TSH: 1.5 u[IU]/mL (ref 0.35–4.50)

## 2013-12-27 NOTE — Progress Notes (Signed)
[place], [address] [place], [place]  [address] Phone: [phone] Fax:  [phone]  Date:  12/27/2013   Patient ID:  Linda, Ellison [birth year], MRN [id number]   PCP:  Hayden Rasmussen., MD  Cardiologist:  Dr. Burt Knack  History of Present Illness: Linda Ellison is a 38 y.o. female with hx of ESRD on HD, chronic chest pain, nonobstructive CAD 06/2012, HTN also with hypotension (started on midodrine 12/2012), HLD, DM, obesity and chronic-appearing sinus tachycardia. She has a history of chronic, atypical-type CP. She underwent dobutamine nuc as part of a pre-transplant workup at [facility] which was abnormal, and this subsequently led to her cath 06/2012 which showed mild luminal irregularities in the LAD and moderate diffuse disease in distal RCA. Echo 12/2012 was normal. Her chart carries a dx of CHF but she has history of normal LV function and normal diastolic function so details are not clear - may have been before she started dialysis. She has since had a nuclear stress test in [place] she thinks was OK in May/June of this year again as part of her transplant list requirements.  Her more recent history includes orthostatic hypotension for which Dr. Burt Knack started her on midodrine in 12/2012. This was prescribed 5mg  TID but she only takes it as needed since it hasn't seemed to do much for her BP. She tends to get chest discomfort when her blood pressure drops. She also gets it for no reason at all, as well as with exertion. It doesn't carry a particular pattern and has remained the same essentially since her last cath. On 12/17/13 she presented to the ED for chest pain and possible syncope. She was at dialysis when she started developing her usual CP. She took 1 SL NTG without improvement but was able to go to sleep. When she woke up her chest was hurting worse so the nurse instructed her to take another SL NTG. This did not ease the pain. She got up after dialysis to walk to the door and woke  up on the floor. She did not hit her head. She reports 3-4 other syncope spells which all occurred either while standing, or after getting up from standing. She generally feels "jittery" and bad before this happens. No true palpitations. When seen in the ED, troponin was negative (despite Cr 4.23), glucose was 289, K 4.1, CBC stable at 11.6. Her dry weight at HD is 101kg. She saw her PCP on 9/21 for an episode of lightheadness after standing and blood pressure dropping. She said her BP has occasionally dropped as low into the 60s during HD. Her PCP cut down her lisinopril to 10mg  daily.   In the office today, BP went from 100/66 lying to 82/38 standing with mild lightheaded feelings but no near-syncope. Pulse did not change much from 96->104. Standing after 3 minutes she was 86/57. She denies any CP today.  Recent Labs: 12/17/2013: ALT 17; Creatinine 4.23*; Hemoglobin 11.6*; Potassium 4.1   Wt Readings from Last 3 Encounters:  12/27/13 229 lb (103.874 kg)  12/17/13 215 lb (97.523 kg)  10/10/13 219 lb (99.338 kg)     Past Medical History  Diagnosis Date  . Asthma   . ESRD on hemodialysis     started dialysis 01/29/11  . Weight loss   . Loss of appetite   . CHF (congestive heart failure)   . Hypertension     a. Also with hx of hypotension - has been started on midodrine.  Marland Kitchen  Hyperlipidemia   . Diabetes mellitus   . Peripheral neuropathy   . Iron deficiency anemia   . Hyperparathyroidism, secondary   . Sciatica   . GERD (gastroesophageal reflux disease)   . Adenoma   . Gastroparesis   . Esophageal erosions     a. Per EGD 10/2011.  Marland Kitchen CAD (coronary artery disease)     a. False positive stress echo 06/2012 at [facility] - cath with mild nonobstructive CAD at [facility] (mild luminal irregularities in LAD, moderate diffuse disease in distal RCA).  . History of echocardiogram     Echo 9/14: EF 55-60%, normal wall motion, normal diastolic function, PASP 21  . Chronic chest pain     a. H/o  longstanding atypical CP. Nonobstructive cath 06/2012 and negative VQ.    Current Outpatient Prescriptions  Medication Sig Dispense Refill  . albuterol (PROVENTIL HFA;VENTOLIN HFA) 108 (90 BASE) MCG/ACT inhaler Inhale 2 puffs into the lungs every 6 (six) hours as needed for wheezing or shortness of breath.      Marland Kitchen amLODipine (NORVASC) 10 MG tablet Take 10 mg by mouth at bedtime. qhs      . aspirin 81 MG tablet Take 81 mg by mouth daily.        . Calcium Carb-Cholecalciferol (CALCIUM 600 + D PO) Take 1 tablet by mouth daily.      Marland Kitchen gabapentin (NEURONTIN) 100 MG capsule Take 100-200 mg by mouth 2 (two) times daily. 100 mg in the am and 200 mg in evening      . hydrALAZINE (APRESOLINE) 25 MG tablet Take 25 mg by mouth at bedtime. Specifically takes at night only per nephrologist      . hydrOXYzine (ATARAX/VISTARIL) 25 MG tablet Take 12.25 mg by mouth every 8 (eight) hours as needed (pruritis). 1/2-1 po q 8 hours prn pruritis      . Insulin Aspart Prot & Aspart (NOVOLOG MIX 70/30 [place]) Inject 15-20 Units into the skin 2 (two) times daily. 15 units every morning and 20 units every evening      . Insulin Detemir (LEVEMIR FLEXTOUCH) 100 UNIT/ML Pen Inject 20 Units into the skin at bedtime.      Marland Kitchen labetalol (NORMODYNE) 200 MG tablet Take 200 mg by mouth 2 (two) times daily .Tuesday, Thursday, Saturday, Sunday (does not take on dialysis days).       Marland Kitchen linagliptin (TRADJENTA) 5 MG TABS tablet Take 5 mg by mouth at bedtime.      Marland Kitchen lisinopril (PRINIVIL,ZESTRIL) 20 MG tablet Take 10 mg by mouth at bedtime.       . midodrine (PROAMATINE) 5 MG tablet Take 5 mg by mouth daily as needed (hypotension).      . multivitamin (RENA-VIT) TABS tablet Take 1 tablet by mouth daily.      . nitroGLYCERIN (NITROSTAT) 0.4 MG SL tablet Place 0.4 mg under the tongue every 5 (five) minutes as needed for chest pain.      Marland Kitchen omeprazole (PRILOSEC) 20 MG capsule Take 20 mg by mouth daily.      . sevelamer carbonate (RENVELA) 800 MG  tablet Take 800-1,600 mg by mouth 3 (three) times daily with meals. 1600 mg with meals and 800 mg with snacks      . simvastatin (ZOCOR) 5 MG tablet Take 5 mg by mouth at bedtime.       No current facility-administered medications for this visit.    Allergies:   Ciprofloxacin; Clindamycin/lincomycin; Pineapple; and Strawberry   Social History:  The patient  reports that she has never smoked. She has never used smokeless tobacco. She reports that she does not drink alcohol or use illicit drugs.   Family History:  The patient's family history includes Arthritis in her brother; Diabetes in her father, maternal aunt, and paternal aunt; Heart disease in her brother, mother, and paternal aunt; Hypertension in her father, maternal aunt, and paternal aunt; Kidney disease in her maternal grandmother and mother; Kidney failure in her paternal aunt and paternal uncle. There is no history of Colon cancer.   ROS:  Please see the history of present illness.    All other systems reviewed and negative.   PHYSICAL EXAM:  VS:  BP 122/64  Pulse 101  Ht 5' 6.5" (1.689 m)  Wt 229 lb (103.874 kg)  BMI 36.41 kg/m2  LMP 12/17/2013 Well nourished, well developed, in no acute distress HEENT: normal Neck: no JVD Cardiac:  normal S1, S2; RRR except mild tachy; no murmur Lungs:  clear to auscultation bilaterally, no wheezing, rhonchi or rales Abd: soft, nontender, no hepatomegaly Ext: no edema Skin: warm and dry Neuro:  moves all extremities spontaneously, no focal abnormalities noted  EKG:  Sinus tach 101 bpm, minimal criteria for LVH, no acute ST-T changes or significant change from prior, QTc 474 ED EKG reviewed with Dr. Rayann Heman - no concerning repol changes, QTc 484 (not 551ms)  ASSESSMENT AND PLAN:  1. 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 She has had no change in quality or frequency of pain. This may tend to occur in setting of hypotension. I have asked nursing to try to obtain a  copy of her stress test from this spring. She will continue on aspirin and PPI. 2. Syncope - I feel this is likely related to orthostatic hypotension. Lisinopril has been cut down which seems to have helped. Will D/C hydralazine. I also don't think she should be taking NTG for her historically non-cardiac chest pain given her tendency for hypotension - I've asked her to D/C this. Given mild QT prolongation, multiple episodes, and "jittery" feeling preceding events will have her wear an event monitor to exclude arrhythmia. Will also update echocardiogram as this would have implications if her EF was low. I have counseled her on the dangers of driving while this is evaluated. If hypotension persists, would strongly consider dropping lisinopril or amlodipine altogether. 3. HTN with h/o hypotension as well - see above. 4. Chronic appearing sinus tachycardia - check thyroid function. 5. ESRD on HD - per renal.  Dispo: F/u Dr. Burt Knack as scheduled 10/23.  Signed, Melina Copa, PA-C  12/27/2013 10:48 AM

## 2013-12-27 NOTE — Telephone Encounter (Signed)
pt notified about echo results with verbal understanding 

## 2013-12-27 NOTE — Progress Notes (Signed)
Patient ID: Linda Ellison, female   DOB: [birth year], 38 y.o.   MRN: [id number] [employer] verite 30 day cardiac event monitor applied to patient.

## 2013-12-27 NOTE — Patient Instructions (Signed)
Your physician has recommended you make the following change in your medication:   1. Stop Hydralazine.   2. Stop Nitroglycerin.  Your physician recommends that you have labs drawn today: TSH, Free T4  Your physician has requested that you have an echocardiogram. Echocardiography is a painless test that uses sound waves to create images of your heart. It provides your doctor with information about the size and shape of your heart and how well your heart's chambers and valves are working. This procedure takes approximately one hour. There are no restrictions for this procedure.  Your physician has recommended that you wear an event monitor. Event monitors are medical devices that record the heart's electrical activity. Doctors most often [place] these monitors to diagnose arrhythmias. Arrhythmias are problems with the speed or rhythm of the heartbeat. The monitor is a small, portable device. You can wear one while you do your normal daily activities. This is usually used to diagnose what is causing palpitations/syncope (passing out).  Your physician recommends that you follow-up with Dr. Burt Knack as scheduled.

## 2013-12-27 NOTE — Progress Notes (Signed)
Echocardiogram performed.  

## 2014-01-02 ENCOUNTER — Telehealth: Payer: Self-pay | Admitting: *Deleted

## 2014-01-02 NOTE — Telephone Encounter (Signed)
lmptcb for lab results 

## 2014-01-03 NOTE — Telephone Encounter (Signed)
F/u    Pt returning call from nurse,

## 2014-01-03 NOTE — Telephone Encounter (Signed)
pt notified baout lab results. Pt will discuss results w/PCP first in regards to possible referral to Endocrinology for euthyroid hyperthyroxinemia.

## 2014-01-25 ENCOUNTER — Encounter: Payer: Self-pay | Admitting: Cardiovascular Disease

## 2014-01-25 ENCOUNTER — Ambulatory Visit (INDEPENDENT_AMBULATORY_CARE_PROVIDER_SITE_OTHER): Payer: Medicare Other | Admitting: Cardiovascular Disease

## 2014-01-25 VITALS — BP 80/54 | HR 80 | Ht 66.5 in | Wt 227.4 lb

## 2014-01-25 DIAGNOSIS — I471 Supraventricular tachycardia: Secondary | ICD-10-CM

## 2014-01-25 DIAGNOSIS — I951 Orthostatic hypotension: Secondary | ICD-10-CM

## 2014-01-25 DIAGNOSIS — R000 Tachycardia, unspecified: Secondary | ICD-10-CM

## 2014-01-25 DIAGNOSIS — I2510 Atherosclerotic heart disease of native coronary artery without angina pectoris: Secondary | ICD-10-CM

## 2014-01-25 NOTE — Patient Instructions (Signed)
Your physician wants you to follow-up in: 1 YEAR with Dr Cooper.  You will receive a reminder letter in the mail two months in advance. If you don't receive a letter, please call our office to schedule the follow-up appointment.  Your physician recommends that you continue on your current medications as directed. Please refer to the Current Medication list given to you today.  

## 2014-01-25 NOTE — Progress Notes (Signed)
Background: The patient is followed for chronic chest pain with nonobstructive coronary artery disease documented by cardiac catheterization in 2014. Other medical problems include end-stage renal disease, hypotension treated with midodrine, hyperlipidemia, type 2 diabetes, and sinus tachycardia.  HPI:  38 year old woman presenting for follow-up evaluation. She's complained of heart palpitations and she currently is wearing an event monitor. I reviewed the findings with her today, which only demonstrates sinus tachycardia. There is no arrhythmia noted. She's had no recent chest pain or shortness of breath. She continues to move forward with evaluation for renal transplantation. She is struggling with dialysis because of chronic hypotension.  Studies:  2-D echocardiogram 12/27/2013: Left ventricle: The cavity size was normal. Wall thickness was normal. Systolic function was normal. The estimated ejection fraction was in the range of 50% to 55%.  ------------------------------------------------------------------- Aortic valve: Structurally normal valve. Cusp separation was normal. Doppler: Transvalvular velocity was within the normal range. There was no stenosis. There was no regurgitation.  ------------------------------------------------------------------- Mitral valve: Mildly thickened leaflets . Leaflet separation was normal. Doppler: Transvalvular velocity was within the normal range. There was no evidence for stenosis. There was no significant regurgitation. Peak gradient (D): 2 mm Hg.  ------------------------------------------------------------------- Left atrium: The atrium was normal in size.  ------------------------------------------------------------------- Right ventricle: The cavity size was normal. Wall thickness was normal. Systolic function was normal.  ------------------------------------------------------------------- Pulmonic valve: Structurally normal valve. Cusp  separation was normal. Doppler: Transvalvular velocity was within the normal range. There was no regurgitation.  ------------------------------------------------------------------- Tricuspid valve: Structurally normal valve. Leaflet separation was normal. Doppler: Transvalvular velocity was within the normal range. There was no regurgitation.  ------------------------------------------------------------------- Right atrium: The atrium was normal in size.  ------------------------------------------------------------------- Pericardium: There was no pericardial effusion.     Outpatient Encounter Prescriptions as of 01/25/2014  Medication Sig  . albuterol (PROVENTIL HFA;VENTOLIN HFA) 108 (90 BASE) MCG/ACT inhaler Inhale 2 puffs into the lungs every 6 (six) hours as needed for wheezing or shortness of breath.  Marland Kitchen aspirin 81 MG tablet Take 81 mg by mouth daily.    . Calcium Carb-Cholecalciferol (CALCIUM 600 + D PO) Take 1 tablet by mouth daily.  Marland Kitchen gabapentin (NEURONTIN) 100 MG capsule Take 100-200 mg by mouth 2 (two) times daily. 100 mg in the am and 200 mg in evening  . hydrOXYzine (ATARAX/VISTARIL) 25 MG tablet Take 12.25 mg by mouth every 8 (eight) hours as needed (pruritis). 1/2-1 po q 8 hours prn pruritis  . Insulin Aspart Prot & Aspart (NOVOLOG MIX 70/30 [place]) Inject 15-20 Units into the skin 2 (two) times daily. 15 units every morning and 20 units every evening  . Insulin Detemir (LEVEMIR FLEXTOUCH) 100 UNIT/ML Pen Inject 20 Units into the skin at bedtime.  Marland Kitchen linagliptin (TRADJENTA) 5 MG TABS tablet Take 5 mg by mouth at bedtime.  . midodrine (PROAMATINE) 5 MG tablet Take 5 mg by mouth daily as needed (hypotension).  . multivitamin (RENA-VIT) TABS tablet Take 1 tablet by mouth daily.  Marland Kitchen omeprazole (PRILOSEC) 20 MG capsule Take 20 mg by mouth daily.  . sevelamer carbonate (RENVELA) 800 MG tablet Take 800-1,600 mg by mouth 3 (three) times daily with meals. 1600 mg with meals and 800 mg  with snacks  . simvastatin (ZOCOR) 5 MG tablet Take 5 mg by mouth at bedtime.  . [DISCONTINUED] amLODipine (NORVASC) 10 MG tablet Take 10 mg by mouth at bedtime.   . [DISCONTINUED] labetalol (NORMODYNE) 200 MG tablet Take 200 mg by mouth 2 (two) times daily. Tuesday, Thursday,  Saturday, Sunday (does not take on dialysis days)  . [DISCONTINUED] lisinopril (PRINIVIL,ZESTRIL) 20 MG tablet Take 10 mg by mouth at bedtime.     Allergies  Allergen Reactions  . Ciprofloxacin Itching, Nausea And Vomiting and Hives  . Clindamycin/Lincomycin   . Fluocinolone Itching and Nausea And Vomiting  . Pineapple Itching    Makes tongue raw per patient.  . Strawberry Itching    Makes tongue raw per patient    Past Medical History  Diagnosis Date  . Asthma   . ESRD on hemodialysis     started dialysis 01/29/11  . Weight loss   . Loss of appetite   . CHF (congestive heart failure)   . Hypertension     a. Also with hx of hypotension - has been started on midodrine.  . Hyperlipidemia   . Diabetes mellitus   . Peripheral neuropathy   . Iron deficiency anemia   . Hyperparathyroidism, secondary   . Sciatica   . GERD (gastroesophageal reflux disease)   . Adenoma   . Gastroparesis   . Esophageal erosions     a. Per EGD 10/2011.  Marland Kitchen CAD (coronary artery disease)     a. False positive stress echo 06/2012 at [facility] - cath with mild nonobstructive CAD at [facility] (mild luminal irregularities in LAD, moderate diffuse disease in distal RCA).  . History of echocardiogram     Echo 9/14: EF 55-60%, normal wall motion, normal diastolic function, PASP 21  . Chronic chest pain     a. H/o longstanding atypical CP. Nonobstructive cath 06/2012 and negative VQ.    family history includes Arthritis in her brother; Diabetes in her father, maternal aunt, and paternal aunt; Heart disease in her brother, mother, and paternal aunt; Hypertension in her father, maternal aunt, and paternal aunt; Kidney disease in her maternal  grandmother and mother; Kidney failure in her paternal aunt and paternal uncle. There is no history of Colon cancer.   ROS: Negative except as per HPI  BP 80/54  Pulse 80  Ht 5' 6.5" (1.689 m)  Wt 227 lb 6.4 oz (103.148 kg)  BMI 36.16 kg/m2  PHYSICAL EXAM: Pt is alert and oriented, obese woman in NAD HEENT: normal Neck: JVP - normal, carotids 2+= without bruits Lungs: CTA bilaterally CV: RRR grade 2/6 systolic ejection murmur at the right upper sternal border Abd: soft, NT, Positive BS, no hepatomegaly Ext: no C/C/E, distal pulses intact and equal Skin: warm/dry no rash  ASSESSMENT AND PLAN: 1. Hypotension. Not much to offer here. Hopefully she will improve after renal transplantation. Advised her to discuss midodrine use with Dr. Mercy Moore. Maybe if she were to use this more regularly or at higher dose may provide some benefit.  2. Nonobstructive CAD. No symptoms of exertional angina. Continue observation. She is at low cardiac risk of surgery.  3. Hyperlipidemia. The patient is treated with low-dose simvastatin.  4. Type 2 diabetes. Treated by primary physician.  5. End-stage renal disease.  For follow-up I will see her back in one year. It is possible that thyroid disease is contributing to her tachycardia. The patient's event monitor was reviewed today. She has upcoming evaluation with endocrinology.  Sherren Mocha, MD 01/25/2014 4:01 PM

## 2014-03-14 ENCOUNTER — Encounter (HOSPITAL_COMMUNITY): Payer: Self-pay | Admitting: Cardiology

## 2014-03-27 ENCOUNTER — Other Ambulatory Visit: Payer: Self-pay | Admitting: *Deleted

## 2014-03-27 DIAGNOSIS — M79642 Pain in left hand: Secondary | ICD-10-CM

## 2014-03-27 DIAGNOSIS — N186 End stage renal disease: Secondary | ICD-10-CM

## 2014-04-09 ENCOUNTER — Other Ambulatory Visit: Payer: Self-pay | Admitting: Internal Medicine

## 2014-04-18 ENCOUNTER — Other Ambulatory Visit: Payer: Self-pay | Admitting: Vascular Surgery

## 2014-04-18 DIAGNOSIS — M79642 Pain in left hand: Secondary | ICD-10-CM

## 2014-04-24 ENCOUNTER — Encounter: Payer: Self-pay | Admitting: Vascular Surgery

## 2014-04-25 ENCOUNTER — Ambulatory Visit (HOSPITAL_COMMUNITY)
Admission: RE | Admit: 2014-04-25 | Discharge: 2014-04-25 | Disposition: A | Discharge status: Home / Self Care | Payer: Medicare Other | Source: Ambulatory Visit | Attending: Vascular Surgery | Admitting: Vascular Surgery

## 2014-04-25 ENCOUNTER — Ambulatory Visit (INDEPENDENT_AMBULATORY_CARE_PROVIDER_SITE_OTHER): Payer: Medicare Other | Admitting: Vascular Surgery

## 2014-04-25 ENCOUNTER — Encounter: Payer: Self-pay | Admitting: Vascular Surgery

## 2014-04-25 ENCOUNTER — Other Ambulatory Visit: Payer: Self-pay

## 2014-04-25 VITALS — BP 121/84 | HR 80 | Ht 66.5 in | Wt 232.0 lb

## 2014-04-25 DIAGNOSIS — M79642 Pain in left hand: Secondary | ICD-10-CM | POA: Diagnosis not present

## 2014-04-25 DIAGNOSIS — T82898A Other specified complication of vascular prosthetic devices, implants and grafts, initial encounter: Secondary | ICD-10-CM

## 2014-04-25 DIAGNOSIS — Y832 Surgical operation with anastomosis, bypass or graft as the cause of abnormal reaction of the patient, or of later complication, without mention of misadventure at the time of the procedure: Secondary | ICD-10-CM | POA: Diagnosis not present

## 2014-04-25 NOTE — Progress Notes (Signed)
She has a 39 year old female who presents today for evaluation of numbness and tingling in her left hand. She had a left radiocephalic AV fistula placed in May 2012. She states that over the last 3 months she has developed progressive numbness and tingling in digits 4 and 5 of her left hand especially when on dialysis. She occasionally has numbness and tingling in the entire hand. She does not have as much numbness in digits 12 and 3. She states that cannulation of her fistula has also become more difficult over time.  Past Medical History  Diagnosis Date  . Asthma   . ESRD on hemodialysis     started dialysis 01/29/11  . Weight loss   . Loss of appetite   . CHF (congestive heart failure)   . Hypertension     a. Also with hx of hypotension - has been started on midodrine.  . Hyperlipidemia   . Diabetes mellitus   . Peripheral neuropathy   . Iron deficiency anemia   . Hyperparathyroidism, secondary   . Sciatica   . GERD (gastroesophageal reflux disease)   . Adenoma   . Gastroparesis   . Esophageal erosions     a. Per EGD 10/2011.  Marland Kitchen CAD (coronary artery disease)     a. False positive stress echo 06/2012 at [facility] - cath with mild nonobstructive CAD at [facility] (mild luminal irregularities in LAD, moderate diffuse disease in distal RCA).  . History of echocardiogram     Echo 9/14: EF 55-60%, normal wall motion, normal diastolic function, PASP 21  . Chronic chest pain     a. H/o longstanding atypical CP. Nonobstructive cath 06/2012 and negative VQ.    Past Surgical History  Procedure Laterality Date  . Cesarean section    . Cholecystectomy  1996  . Av fistula placement  08/2010    Left radiocephalic AVF  . Ligation goretex fistula  01/04/11    Left AVF  . Colonoscopy    . Esophagogastroduodenoscopy  10/19/2011    Dr. Silvano Rusk  . Foot surgery    . Tubal ligation    . Left heart catheterization with coronary angiogram N/A 06/27/2012    Procedure: LEFT HEART CATHETERIZATION WITH  CORONARY ANGIOGRAM;  Surgeon: Larey Dresser, MD;  Location: [facility] CATH LAB;  Service: Cardiovascular;  Laterality: N/A;   Current Outpatient Prescriptions on File Prior to Visit  Medication Sig Dispense Refill  . albuterol (PROVENTIL HFA;VENTOLIN HFA) 108 (90 BASE) MCG/ACT inhaler Inhale 2 puffs into the lungs every 6 (six) hours as needed for wheezing or shortness of breath.    Marland Kitchen aspirin 81 MG tablet Take 81 mg by mouth daily.      . Calcium Carb-Cholecalciferol (CALCIUM 600 + D PO) Take 1 tablet by mouth daily.    Marland Kitchen gabapentin (NEURONTIN) 100 MG capsule Take 100-200 mg by mouth 2 (two) times daily. 100 mg in the am and 200 mg in evening    . hydrOXYzine (ATARAX/VISTARIL) 25 MG tablet Take 12.25 mg by mouth every 8 (eight) hours as needed (pruritis). 1/2-1 po q 8 hours prn pruritis    . Insulin Aspart Prot & Aspart (NOVOLOG MIX 70/30 [place]) Inject 15-20 Units into the skin 2 (two) times daily. 15 units every morning and 20 units every evening    . Insulin Detemir (LEVEMIR FLEXTOUCH) 100 UNIT/ML Pen Inject 20 Units into the skin at bedtime.    Marland Kitchen linagliptin (TRADJENTA) 5 MG TABS tablet Take 5 mg by mouth at bedtime.    Marland Kitchen  midodrine (PROAMATINE) 5 MG tablet Take 5 mg by mouth daily as needed (hypotension).    . multivitamin (RENA-VIT) TABS tablet Take 1 tablet by mouth daily.    Marland Kitchen omeprazole (PRILOSEC) 20 MG capsule Take 20 mg by mouth daily.    Marland Kitchen omeprazole (PRILOSEC) 20 MG capsule TAKE 1 CAPSULE BY MOUTH EVERY DAY 30 capsule 2  . sevelamer carbonate (RENVELA) 800 MG tablet Take 800-1,600 mg by mouth 3 (three) times daily with meals. 1600 mg with meals and 800 mg with snacks    . simvastatin (ZOCOR) 5 MG tablet Take 5 mg by mouth at bedtime.     No current facility-administered medications on file prior to visit.    Allergies  Allergen Reactions  . Ciprofloxacin Itching, Nausea And Vomiting and Hives  . Clindamycin/Lincomycin   . Fluocinolone Itching and Nausea And Vomiting  . Pineapple Itching     Makes tongue raw per patient.  . Strawberry Itching    Makes tongue raw per patient   Review of systems: She has asthma but this is been controlled. She denies shortness of breath. She denies chest pain.  Physical exam:  Filed Vitals:   04/25/14 1532  BP: 121/84  Pulse: 80  Height: 5' 6.5" (1.689 m)  Weight: 232 lb (105.235 kg)  SpO2: 98%    Chest: Clear to auscultation bilaterally  Cardiac: Regular rate and rhythm without murmur  Extremities: Left upper extremity palpable thrill in fistula. Left hand subjectively decreased sensation digits 4 and 5 compared to right. Motor strength 5 over 5 and symmetric to right.  Data: Patient had a duplex ultrasound today which showed increase in pressure in her left hand of approximately 50 mmHg with compression of the fistula  Assessment: Possible mild to moderate steal symptoms left upper extremity. The patient states that the symptoms are tolerable in the short-term but she would like to consider placement of a new hemodialysis access and then ligation of the left side after this is more developed.  Plan: We will bring her back next week for right upper extremity vein mapping. We will plan to place a new access on February 2 in the right arm. We will then consider ligation of the left radiocephalic fistula after the right arm access is usable.  Risks benefits possible complications and procedure details including but not limited to bleeding infection ischemic steal non-maturation of fistular access or occlusion of the access was described to the patient today she understands and agreed to proceed.  Ruta Hinds, MD [facility] [place] Office: [phone] Pager: [phone]

## 2014-04-29 ENCOUNTER — Other Ambulatory Visit: Payer: Self-pay | Admitting: *Deleted

## 2014-04-29 DIAGNOSIS — N186 End stage renal disease: Secondary | ICD-10-CM

## 2014-04-29 DIAGNOSIS — Z01810 Encounter for preprocedural cardiovascular examination: Secondary | ICD-10-CM

## 2014-04-30 ENCOUNTER — Ambulatory Visit (HOSPITAL_COMMUNITY)
Admission: RE | Admit: 2014-04-30 | Discharge: 2014-04-30 | Disposition: A | Discharge status: Home / Self Care | Payer: Medicare Other | Source: Ambulatory Visit | Attending: Vascular Surgery | Admitting: Vascular Surgery

## 2014-04-30 DIAGNOSIS — N186 End stage renal disease: Secondary | ICD-10-CM | POA: Diagnosis not present

## 2014-04-30 DIAGNOSIS — Z01810 Encounter for preprocedural cardiovascular examination: Secondary | ICD-10-CM | POA: Insufficient documentation

## 2014-05-03 ENCOUNTER — Encounter (HOSPITAL_COMMUNITY): Payer: Self-pay | Admitting: *Deleted

## 2014-05-03 NOTE — Progress Notes (Signed)
Pt denies SOB, chest pain, and being under the care of a cardiologist. Pt made aware to not take any diabetic medication the morning of procedure. Pt made aware stop NSAIDs, otc vitamins, and herbal supplements. According to pt, she was advised by surgeons staff to take half of Levemir the night before procedure.Pt verbalized understanding of instructions.

## 2014-05-04 ENCOUNTER — Emergency Department (HOSPITAL_COMMUNITY): Payer: Medicare Other

## 2014-05-04 ENCOUNTER — Encounter (HOSPITAL_COMMUNITY): Payer: Self-pay | Admitting: Emergency Medicine

## 2014-05-04 ENCOUNTER — Other Ambulatory Visit: Payer: Self-pay

## 2014-05-04 ENCOUNTER — Emergency Department (HOSPITAL_COMMUNITY)
Admission: EM | Admit: 2014-05-04 | Discharge: 2014-05-04 | Disposition: A | Discharge status: Home / Self Care | Payer: Medicare Other | Attending: Emergency Medicine | Admitting: Emergency Medicine

## 2014-05-04 DIAGNOSIS — E211 Secondary hyperparathyroidism, not elsewhere classified: Secondary | ICD-10-CM | POA: Insufficient documentation

## 2014-05-04 DIAGNOSIS — R079 Chest pain, unspecified: Secondary | ICD-10-CM | POA: Insufficient documentation

## 2014-05-04 DIAGNOSIS — M5430 Sciatica, unspecified side: Secondary | ICD-10-CM | POA: Insufficient documentation

## 2014-05-04 DIAGNOSIS — Z3202 Encounter for pregnancy test, result negative: Secondary | ICD-10-CM | POA: Insufficient documentation

## 2014-05-04 DIAGNOSIS — I509 Heart failure, unspecified: Secondary | ICD-10-CM | POA: Diagnosis not present

## 2014-05-04 DIAGNOSIS — I2510 Atherosclerotic heart disease of native coronary artery without angina pectoris: Secondary | ICD-10-CM | POA: Insufficient documentation

## 2014-05-04 DIAGNOSIS — I120 Hypertensive chronic kidney disease with stage 5 chronic kidney disease or end stage renal disease: Secondary | ICD-10-CM | POA: Insufficient documentation

## 2014-05-04 DIAGNOSIS — Z79899 Other long term (current) drug therapy: Secondary | ICD-10-CM | POA: Diagnosis not present

## 2014-05-04 DIAGNOSIS — N186 End stage renal disease: Secondary | ICD-10-CM | POA: Insufficient documentation

## 2014-05-04 DIAGNOSIS — E785 Hyperlipidemia, unspecified: Secondary | ICD-10-CM | POA: Diagnosis not present

## 2014-05-04 DIAGNOSIS — E119 Type 2 diabetes mellitus without complications: Secondary | ICD-10-CM | POA: Diagnosis not present

## 2014-05-04 DIAGNOSIS — M542 Cervicalgia: Secondary | ICD-10-CM | POA: Diagnosis not present

## 2014-05-04 DIAGNOSIS — J45909 Unspecified asthma, uncomplicated: Secondary | ICD-10-CM | POA: Insufficient documentation

## 2014-05-04 DIAGNOSIS — Z7982 Long term (current) use of aspirin: Secondary | ICD-10-CM | POA: Diagnosis not present

## 2014-05-04 DIAGNOSIS — G8929 Other chronic pain: Secondary | ICD-10-CM | POA: Insufficient documentation

## 2014-05-04 DIAGNOSIS — H9209 Otalgia, unspecified ear: Secondary | ICD-10-CM | POA: Diagnosis not present

## 2014-05-04 DIAGNOSIS — K219 Gastro-esophageal reflux disease without esophagitis: Secondary | ICD-10-CM | POA: Insufficient documentation

## 2014-05-04 DIAGNOSIS — Z992 Dependence on renal dialysis: Secondary | ICD-10-CM | POA: Diagnosis not present

## 2014-05-04 DIAGNOSIS — Z862 Personal history of diseases of the blood and blood-forming organs and certain disorders involving the immune mechanism: Secondary | ICD-10-CM | POA: Insufficient documentation

## 2014-05-04 DIAGNOSIS — R51 Headache: Secondary | ICD-10-CM | POA: Insufficient documentation

## 2014-05-04 DIAGNOSIS — Z9889 Other specified postprocedural states: Secondary | ICD-10-CM | POA: Insufficient documentation

## 2014-05-04 DIAGNOSIS — Z794 Long term (current) use of insulin: Secondary | ICD-10-CM | POA: Diagnosis not present

## 2014-05-04 DIAGNOSIS — E875 Hyperkalemia: Secondary | ICD-10-CM | POA: Diagnosis not present

## 2014-05-04 DIAGNOSIS — R519 Headache, unspecified: Secondary | ICD-10-CM

## 2014-05-04 LAB — CBC
HCT: 38.1 % (ref 36.0–46.0)
Hemoglobin: 12 g/dL (ref 12.0–15.0)
MCH: 27.4 pg (ref 26.0–34.0)
MCHC: 31.5 g/dL (ref 30.0–36.0)
MCV: 87 fL (ref 78.0–100.0)
Platelets: 144 10*3/uL — ABNORMAL LOW (ref 150–400)
RBC: 4.38 MIL/uL (ref 3.87–5.11)
RDW: 14.5 % (ref 11.5–15.5)
WBC: 4.8 10*3/uL (ref 4.0–10.5)

## 2014-05-04 LAB — URINALYSIS, ROUTINE W REFLEX MICROSCOPIC
Bilirubin Urine: NEGATIVE
Glucose, UA: 100 mg/dL — AB
Hgb urine dipstick: NEGATIVE
Ketones, ur: NEGATIVE mg/dL
Leukocytes, UA: NEGATIVE
Nitrite: NEGATIVE
Protein, ur: >300 mg/dL — AB
Specific Gravity, Urine: 1.014 (ref 1.005–1.030)
Urobilinogen, UA: 0.2 mg/dL (ref 0.0–1.0)
pH: 8.5 — ABNORMAL HIGH (ref 5.0–8.0)

## 2014-05-04 LAB — BASIC METABOLIC PANEL
Anion gap: 8 (ref 5–15)
BUN: 24 mg/dL — AB (ref 6–23)
CALCIUM: 8.9 mg/dL (ref 8.4–10.5)
CO2: 31 mmol/L (ref 19–32)
Chloride: 98 mmol/L (ref 96–112)
Creatinine, Ser: 6.25 mg/dL — ABNORMAL HIGH (ref 0.50–1.10)
GFR calc Af Amer: 9 mL/min — ABNORMAL LOW (ref >90)
GFR, EST NON AFRICAN AMERICAN: 8 mL/min — AB (ref >90)
Glucose, Bld: 189 mg/dL — ABNORMAL HIGH (ref 70–99)
POTASSIUM: 5.9 mmol/L — AB (ref 3.5–5.1)
Sodium: 137 mmol/L (ref 135–145)

## 2014-05-04 LAB — I-STAT TROPONIN, ED: TROPONIN I, POC: 0 ng/mL (ref 0.00–0.08)

## 2014-05-04 LAB — URINE MICROSCOPIC-ADD ON

## 2014-05-04 LAB — PREGNANCY, URINE: Preg Test, Ur: NEGATIVE

## 2014-05-04 MED ORDER — SODIUM CHLORIDE 0.9 % IV BOLUS (SEPSIS)
1000.0000 mL | Freq: Once | INTRAVENOUS | Status: AC
  Administered 2014-05-04: 1000 mL via INTRAVENOUS

## 2014-05-04 MED ORDER — PROCHLORPERAZINE EDISYLATE 5 MG/ML IJ SOLN
10.0000 mg | Freq: Once | INTRAMUSCULAR | Status: AC
  Administered 2014-05-04: 10 mg via INTRAVENOUS
  Filled 2014-05-04: qty 2

## 2014-05-04 MED ORDER — MORPHINE SULFATE 4 MG/ML IJ SOLN
4.0000 mg | Freq: Once | INTRAMUSCULAR | Status: AC
  Administered 2014-05-04: 4 mg via INTRAVENOUS
  Filled 2014-05-04: qty 1

## 2014-05-04 MED ORDER — HYDROCODONE-ACETAMINOPHEN 5-325 MG PO TABS: 1.0000 | ORAL_TABLET | Freq: Four times a day (QID) | ORAL | Status: DC | PRN

## 2014-05-04 MED ORDER — SODIUM POLYSTYRENE SULFONATE PO POWD: Freq: Once | ORAL | Status: DC

## 2014-05-04 MED ORDER — MORPHINE SULFATE 4 MG/ML IJ SOLN
6.0000 mg | Freq: Once | INTRAMUSCULAR | Status: AC
  Administered 2014-05-04: 6 mg via INTRAVENOUS
  Filled 2014-05-04: qty 2

## 2014-05-04 NOTE — ED Notes (Signed)
Pt. Stated, I've had chest pain for 2 days and when I woke up this morning I had right neck pain , right face pain and headache , and my chest is still hurting.

## 2014-05-04 NOTE — ED Provider Notes (Signed)
CSN: [id number]     Arrival date & time 05/04/14  1143 History   First MD Initiated Contact with Patient 05/04/14 1155     Chief Complaint  Patient presents with  . Neck Pain  . Otalgia  . Chest Pain  . Headache     (Consider location/radiation/quality/duration/timing/severity/associated sxs/prior Treatment) HPI Patient presents to the emergency department with multiple complaints of chest pain first 2 days, right-sided neck pain.  This morning, right face pain and headache that she has also had a headache for 4 days.  The patient states that she has had chest pain similar to this in the past seen by cardiology and here in the emergency department.  Patient states that nothing seems to make her condition better or worse.  The patient denies nausea, vomiting, weakness, dizziness, blurred vision, cough, runny nose, sore throat, fever, shortness of breath, rash, diarrhea, near syncope, lightheadedness or syncope.  Patient states that nothing seems make her condition better or worse Past Medical History  Diagnosis Date  . Asthma   . ESRD on hemodialysis     started dialysis 01/29/11  . Weight loss   . Loss of appetite   . CHF (congestive heart failure)   . Hypertension     a. Also with hx of hypotension - has been started on midodrine.  . Hyperlipidemia   . Diabetes mellitus   . Peripheral neuropathy   . Iron deficiency anemia   . Hyperparathyroidism, secondary   . Sciatica   . GERD (gastroesophageal reflux disease)   . Adenoma   . Gastroparesis   . Esophageal erosions     a. Per EGD 10/2011.  Marland Kitchen CAD (coronary artery disease)     a. False positive stress echo 06/2012 at [facility] - cath with mild nonobstructive CAD at [facility] (mild luminal irregularities in LAD, moderate diffuse disease in distal RCA).  . History of echocardiogram     Echo 9/14: EF 55-60%, normal wall motion, normal diastolic function, PASP 21  . Chronic chest pain     a. H/o longstanding atypical CP. Nonobstructive cath  06/2012 and negative VQ.  Marland Kitchen PONV (postoperative nausea and vomiting)    Past Surgical History  Procedure Laterality Date  . Cesarean section    . Cholecystectomy  1996  . Av fistula placement  08/2010    Left radiocephalic AVF  . Ligation goretex fistula  01/04/11    Left AVF  . Colonoscopy    . Esophagogastroduodenoscopy  10/19/2011    Dr. Silvano Rusk  . Foot surgery    . Tubal ligation    . Left heart catheterization with coronary angiogram N/A 06/27/2012    Procedure: LEFT HEART CATHETERIZATION WITH CORONARY ANGIOGRAM;  Surgeon: Larey Dresser, MD;  Location: [facility] CATH LAB;  Service: Cardiovascular;  Laterality: N/A;  . Fracture surgery      left femur  . Dilation and curettage of uterus     Family History  Problem Relation Age of Onset  . Heart disease Mother     heart attack at 34 y/o-infected valve  . Kidney disease Mother     was a dialysis patient  . Heart disease Brother   . Kidney disease Maternal Grandmother     pre-dialysis  . Kidney failure Paternal Uncle   . Kidney failure Paternal Aunt   . Diabetes Father   . Hypertension Father   . Colon cancer Neg Hx   . Arthritis Brother   . Diabetes Maternal Aunt   .  Hypertension Maternal Aunt   . Diabetes Paternal Aunt   . Heart disease Paternal Aunt   . Hypertension Paternal Aunt    History  Substance Use Topics  . Smoking status: Never Smoker   . Smokeless tobacco: Never Used  . Alcohol Use: No   OB History    No data available     Review of Systems  All other systems negative except as documented in the HPI. All pertinent positives and negatives as reviewed in the HPI.  Allergies  Ciprofloxacin; Clindamycin/lincomycin; Fluocinolone; Pineapple; Strawberry; and Adhesive  Home Medications   Prior to Admission medications   Medication Sig Start Date End Date Taking? Authorizing Provider  aspirin 81 MG tablet Take 81 mg by mouth daily.     Yes Historical Provider, MD  Calcium Carb-Cholecalciferol (CALCIUM  600 + D PO) Take 1 tablet by mouth daily.   Yes Historical Provider, MD  gabapentin (NEURONTIN) 100 MG capsule Take 100-200 mg by mouth 2 (two) times daily. 100 mg in the am and 200 mg in evening   Yes Historical Provider, MD  hydrOXYzine (ATARAX/VISTARIL) 25 MG tablet Take 12.25 mg by mouth every 8 (eight) hours as needed (pruritis). 1/2-1 po q 8 hours prn pruritis 06/01/13 06/01/14 Yes Historical Provider, MD  Insulin Aspart Prot & Aspart (NOVOLOG MIX 70/30 [place]) Inject 35-45 Units into the skin 2 (two) times daily. 45 units every morning and 35 units every evening   Yes Historical Provider, MD  Insulin Detemir (LEVEMIR FLEXTOUCH) 100 UNIT/ML Pen Inject 20 Units into the skin at bedtime. 11/17/13  Yes Historical Provider, MD  linagliptin (TRADJENTA) 5 MG TABS tablet Take 5 mg by mouth at bedtime.   Yes Historical Provider, MD  methimazole (TAPAZOLE) 10 MG tablet Take 10 mg by mouth 2 (two) times daily. 04/20/14  Yes Historical Provider, MD  multivitamin (RENA-VIT) TABS tablet Take 1 tablet by mouth daily.   Yes Historical Provider, MD  omeprazole (PRILOSEC) 20 MG capsule TAKE 1 CAPSULE BY MOUTH EVERY DAY 04/10/14  Yes Gatha Mayer, MD  sevelamer carbonate (RENVELA) 800 MG tablet Take 800-1,600 mg by mouth 3 (three) times daily with meals. 1600 mg with meals and 800 mg with snacks   Yes Historical Provider, MD  simvastatin (ZOCOR) 5 MG tablet Take 5 mg by mouth at bedtime.   Yes Historical Provider, MD  albuterol (PROVENTIL HFA;VENTOLIN HFA) 108 (90 BASE) MCG/ACT inhaler Inhale 2 puffs into the lungs every 6 (six) hours as needed for wheezing or shortness of breath.    Historical Provider, MD   BP 143/94 mmHg  Pulse 94  Temp(Src) 98.3 F (36.8 C) (Oral)  Resp 16  Ht 5' 6.5" (1.689 m)  Wt 227 lb 15.3 oz (103.4 kg)  BMI 36.25 kg/m2  SpO2 99%  LMP 04/29/2014 Physical Exam  Constitutional: She is oriented to person, place, and time. She appears well-developed and well-nourished. No distress.  HENT:   Head: Normocephalic and atraumatic.  Mouth/Throat: Oropharynx is clear and moist.  Eyes: Pupils are equal, round, and reactive to light.  Neck: Normal range of motion. Neck supple.  Cardiovascular: Normal rate, regular rhythm and normal heart sounds.  Exam reveals no gallop and no friction rub.   No murmur heard. Pulmonary/Chest: Effort normal and breath sounds normal. No respiratory distress. She has no wheezes.  Neurological: She is alert and oriented to person, place, and time. She exhibits normal muscle tone. Coordination normal.  Skin: Skin is warm and dry. No rash noted.  No erythema.  Nursing note and vitals reviewed.   ED Course  Procedures (including critical care time) Labs Review Labs Reviewed  CBC - Abnormal; Notable for the following:    Platelets 144 (*)    All other components within normal limits  BASIC METABOLIC PANEL - Abnormal; Notable for the following:    Potassium 5.9 (*)    Glucose, Bld 189 (*)    BUN 24 (*)    Creatinine, Ser 6.25 (*)    GFR calc non Af Amer 8 (*)    GFR calc Af Amer 9 (*)    All other components within normal limits  URINALYSIS, ROUTINE W REFLEX MICROSCOPIC - Abnormal; Notable for the following:    pH 8.5 (*)    Glucose, UA 100 (*)    Protein, ur >300 (*)    All other components within normal limits  URINE MICROSCOPIC-ADD ON - Abnormal; Notable for the following:    Squamous Epithelial / LPF FEW (*)    All other components within normal limits  PREGNANCY, URINE  I-STAT TROPOININ, ED  I-STAT TROPOININ, ED    Imaging Review Dg Chest 2 View  05/04/2014   CLINICAL DATA:  Right-sided chest pain for 3 days.  EXAM: CHEST  2 VIEW  COMPARISON:  December 17, 2013.  FINDINGS: The heart size and mediastinal contours are within normal limits. Both lungs are clear. No pneumothorax or pleural effusion is noted. The visualized skeletal structures are unremarkable.  IMPRESSION: No acute cardiopulmonary abnormality seen.   Electronically Signed    By: Sabino Dick M.D.   On: 05/04/2014 13:33   Ct Head Wo Contrast  05/04/2014   CLINICAL DATA:  Right side headache for 4 days. Dizziness. Blurry vision from the right eye.  EXAM: CT HEAD WITHOUT CONTRAST  TECHNIQUE: Contiguous axial images were obtained from the base of the skull through the vertex without intravenous contrast.  COMPARISON:  Head CT scan 04/2025 status that head CT scan 03/11/2010 and 12/08/2009.  FINDINGS: The brain appears normal without hemorrhage, infarct, mass lesion, mass effect, midline shift or abnormal extra-axial fluid collection. No hydrocephalus or pneumocephalus. The calvarium is intact. Imaged paranasal sinuses and mastoid air cells are clear.  IMPRESSION: Negative head CT.   Electronically Signed   By: Inge Rise M.D.   On: 05/04/2014 13:23     EKG Interpretation   Date/Time:  Saturday May 04 2014 11:47:13 EST Ventricular Rate:  103 PR Interval:  138 QRS Duration: 68 QT Interval:  368 QTC Calculation: 482 R Axis:   52 Text Interpretation:  Sinus tachycardia Otherwise normal ECG Confirmed by  Hazle Coca [phone]) on 05/04/2014 4:20:00 PM      I spoke with nephrology who advised that she could just follow-up Monday with her dialysis since she has normal EKG.  Nephrology wants her to take Kayexalate today and tomorrow and then she can follow up with her usual dialysis Monday   Brent General, PA-C 05/04/14 1715  Quintella Reichert, MD 05/05/14 [phone]

## 2014-05-04 NOTE — Discharge Instructions (Signed)
Return here as needed. Follow up with your regular dialysis.

## 2014-05-06 MED ORDER — DEXTROSE 5 % IV SOLN
1.5000 g | INTRAVENOUS | Status: AC
  Administered 2014-05-07: 1.5 g via INTRAVENOUS
  Filled 2014-05-06: qty 1.5

## 2014-05-06 MED ORDER — SODIUM CHLORIDE 0.9 % IV SOLN: INTRAVENOUS | Status: DC

## 2014-05-06 MED ORDER — CHLORHEXIDINE GLUCONATE CLOTH 2 % EX PADS: 6.0000 | MEDICATED_PAD | Freq: Once | CUTANEOUS | Status: DC

## 2014-05-06 NOTE — Anesthesia Preprocedure Evaluation (Addendum)
Anesthesia Evaluation  Patient identified by MRN, date of birth, ID band Patient awake    Reviewed: Allergy & Precautions, NPO status , Patient's Chart, lab work & pertinent test results  History of Anesthesia Complications (+) PONV  Airway Mallampati: II  TM Distance: >3 FB     Dental  (+) Missing, Teeth Intact, Dental Advisory Given,    Pulmonary asthma (inhalers) ,  breath sounds clear to auscultation        Cardiovascular hypertension, Pt. on medications + CAD and +CHF Rhythm:Regular  ECHO 2015 EF 55%, EKG 05/06/2014 sinus tach, otherwise normal   Neuro/Psych  Headaches, Depression  Neuromuscular disease    GI/Hepatic GERD-  Medicated and Controlled,  Endo/Other  diabetes, Poorly Controlled, Insulin Dependent  Renal/GU DialysisRenal disease     Musculoskeletal   Abdominal (+)  Abdomen: soft. Bowel sounds: normal.  Peds  Hematology  (+) anemia , 12/38 H/H   Anesthesia Other Findings   Reproductive/Obstetrics HCG neg                          Anesthesia Physical Anesthesia Plan  ASA: III  Anesthesia Plan: MAC and General   Post-op Pain Management:    Induction: Intravenous  Airway Management Planned: LMA, Oral ETT and Natural Airway  Additional Equipment:   Intra-op Plan:   Post-operative Plan:   Informed Consent: I have reviewed the patients History and Physical, chart, labs and discussed the procedure including the risks, benefits and alternatives for the proposed anesthesia with the patient or authorized representative who has indicated his/her understanding and acceptance.     Plan Discussed with:   Anesthesia Plan Comments:         Anesthesia Quick Evaluation

## 2014-05-07 ENCOUNTER — Other Ambulatory Visit: Payer: Self-pay | Admitting: *Deleted

## 2014-05-07 ENCOUNTER — Ambulatory Visit (HOSPITAL_COMMUNITY)
Admission: RE | Admit: 2014-05-07 | Discharge: 2014-05-07 | Disposition: A | Discharge status: Home / Self Care | Payer: Medicare Other | Source: Ambulatory Visit | Attending: Vascular Surgery | Admitting: Vascular Surgery

## 2014-05-07 ENCOUNTER — Ambulatory Visit (HOSPITAL_COMMUNITY): Payer: Medicare Other | Admitting: Certified Registered"

## 2014-05-07 ENCOUNTER — Encounter (HOSPITAL_COMMUNITY): Payer: Self-pay | Admitting: *Deleted

## 2014-05-07 ENCOUNTER — Encounter (HOSPITAL_COMMUNITY)
Admission: RE | Disposition: A | Discharge status: Home / Self Care | Payer: Self-pay | Source: Ambulatory Visit | Attending: Vascular Surgery

## 2014-05-07 ENCOUNTER — Telehealth: Payer: Self-pay | Admitting: Vascular Surgery

## 2014-05-07 DIAGNOSIS — N2581 Secondary hyperparathyroidism of renal origin: Secondary | ICD-10-CM | POA: Insufficient documentation

## 2014-05-07 DIAGNOSIS — I120 Hypertensive chronic kidney disease with stage 5 chronic kidney disease or end stage renal disease: Secondary | ICD-10-CM | POA: Diagnosis present

## 2014-05-07 DIAGNOSIS — I2510 Atherosclerotic heart disease of native coronary artery without angina pectoris: Secondary | ICD-10-CM | POA: Diagnosis not present

## 2014-05-07 DIAGNOSIS — J45909 Unspecified asthma, uncomplicated: Secondary | ICD-10-CM | POA: Diagnosis not present

## 2014-05-07 DIAGNOSIS — E119 Type 2 diabetes mellitus without complications: Secondary | ICD-10-CM | POA: Insufficient documentation

## 2014-05-07 DIAGNOSIS — I509 Heart failure, unspecified: Secondary | ICD-10-CM | POA: Diagnosis not present

## 2014-05-07 DIAGNOSIS — K219 Gastro-esophageal reflux disease without esophagitis: Secondary | ICD-10-CM | POA: Insufficient documentation

## 2014-05-07 DIAGNOSIS — K3184 Gastroparesis: Secondary | ICD-10-CM | POA: Insufficient documentation

## 2014-05-07 DIAGNOSIS — D649 Anemia, unspecified: Secondary | ICD-10-CM | POA: Insufficient documentation

## 2014-05-07 DIAGNOSIS — Z794 Long term (current) use of insulin: Secondary | ICD-10-CM | POA: Insufficient documentation

## 2014-05-07 DIAGNOSIS — Z992 Dependence on renal dialysis: Secondary | ICD-10-CM | POA: Insufficient documentation

## 2014-05-07 DIAGNOSIS — N186 End stage renal disease: Secondary | ICD-10-CM

## 2014-05-07 DIAGNOSIS — E785 Hyperlipidemia, unspecified: Secondary | ICD-10-CM | POA: Diagnosis not present

## 2014-05-07 DIAGNOSIS — Z7982 Long term (current) use of aspirin: Secondary | ICD-10-CM | POA: Diagnosis not present

## 2014-05-07 DIAGNOSIS — G629 Polyneuropathy, unspecified: Secondary | ICD-10-CM | POA: Insufficient documentation

## 2014-05-07 DIAGNOSIS — Z4931 Encounter for adequacy testing for hemodialysis: Secondary | ICD-10-CM

## 2014-05-07 DIAGNOSIS — N185 Chronic kidney disease, stage 5: Secondary | ICD-10-CM

## 2014-05-07 HISTORY — DX: Other specified postprocedural states: R11.2

## 2014-05-07 HISTORY — PX: AV FISTULA PLACEMENT: SHX1204

## 2014-05-07 HISTORY — DX: Other specified postprocedural states: Z98.890

## 2014-05-07 LAB — POCT I-STAT 4, (NA,K, GLUC, HGB,HCT)
GLUCOSE: 232 mg/dL — AB (ref 70–99)
HEMATOCRIT: 37 % (ref 36.0–46.0)
Hemoglobin: 12.6 g/dL (ref 12.0–15.0)
POTASSIUM: 4.2 mmol/L (ref 3.5–5.1)
SODIUM: 137 mmol/L (ref 135–145)

## 2014-05-07 LAB — HCG, SERUM, QUALITATIVE: Preg, Serum: NEGATIVE

## 2014-05-07 LAB — GLUCOSE, CAPILLARY
GLUCOSE-CAPILLARY: 253 mg/dL — AB (ref 70–99)
Glucose-Capillary: 227 mg/dL — ABNORMAL HIGH (ref 70–99)

## 2014-05-07 SURGERY — ARTERIOVENOUS (AV) FISTULA CREATION
Anesthesia: Monitor Anesthesia Care | Site: Arm Lower | Laterality: Right

## 2014-05-07 MED ORDER — HYDROMORPHONE HCL 1 MG/ML IJ SOLN
0.5000 mg | INTRAMUSCULAR | Status: DC | PRN
  Administered 2014-05-07: 0.5 mg via INTRAVENOUS
  Administered 2014-05-07: 0.25 mg via INTRAVENOUS

## 2014-05-07 MED ORDER — PROMETHAZINE HCL 25 MG/ML IJ SOLN: 6.2500 mg | INTRAMUSCULAR | Status: DC | PRN

## 2014-05-07 MED ORDER — SODIUM CHLORIDE 0.9 % IR SOLN
Status: DC | PRN
  Administered 2014-05-07: 500 mL

## 2014-05-07 MED ORDER — MEPERIDINE HCL 25 MG/ML IJ SOLN: 6.2500 mg | INTRAMUSCULAR | Status: DC | PRN

## 2014-05-07 MED ORDER — LIDOCAINE HCL (PF) 1 % IJ SOLN
INTRAMUSCULAR | Status: AC
  Filled 2014-05-07: qty 30

## 2014-05-07 MED ORDER — ROCURONIUM BROMIDE 50 MG/5ML IV SOLN
INTRAVENOUS | Status: AC
  Filled 2014-05-07: qty 1

## 2014-05-07 MED ORDER — PROPOFOL 10 MG/ML IV BOLUS
INTRAVENOUS | Status: DC | PRN
  Administered 2014-05-07: 150 mg via INTRAVENOUS

## 2014-05-07 MED ORDER — THROMBIN 20000 UNITS EX SOLR
CUTANEOUS | Status: AC
  Filled 2014-05-07: qty 20000

## 2014-05-07 MED ORDER — FENTANYL CITRATE 0.05 MG/ML IJ SOLN
INTRAMUSCULAR | Status: AC
  Administered 2014-05-07: 50 ug via INTRAVENOUS
  Filled 2014-05-07: qty 2

## 2014-05-07 MED ORDER — FENTANYL CITRATE 0.05 MG/ML IJ SOLN
INTRAMUSCULAR | Status: DC | PRN
  Administered 2014-05-07: 50 ug via INTRAVENOUS
  Administered 2014-05-07 (×2): 25 ug via INTRAVENOUS
  Administered 2014-05-07: 50 ug via INTRAVENOUS
  Administered 2014-05-07 (×2): 25 ug via INTRAVENOUS

## 2014-05-07 MED ORDER — ALBUMIN HUMAN 5 % IV SOLN
INTRAVENOUS | Status: DC | PRN
  Administered 2014-05-07: 09:00:00 via INTRAVENOUS

## 2014-05-07 MED ORDER — PROPOFOL 10 MG/ML IV BOLUS
INTRAVENOUS | Status: AC
  Filled 2014-05-07: qty 20

## 2014-05-07 MED ORDER — HYDROMORPHONE HCL 1 MG/ML IJ SOLN
INTRAMUSCULAR | Status: AC
  Filled 2014-05-07: qty 1

## 2014-05-07 MED ORDER — PHENYLEPHRINE HCL 10 MG/ML IJ SOLN
INTRAMUSCULAR | Status: DC | PRN
  Administered 2014-05-07 (×2): 40 ug via INTRAVENOUS
  Administered 2014-05-07 (×2): 80 ug via INTRAVENOUS
  Administered 2014-05-07 (×2): 40 ug via INTRAVENOUS
  Administered 2014-05-07: 80 ug via INTRAVENOUS

## 2014-05-07 MED ORDER — SUCCINYLCHOLINE CHLORIDE 20 MG/ML IJ SOLN
INTRAMUSCULAR | Status: AC
  Filled 2014-05-07: qty 1

## 2014-05-07 MED ORDER — 0.9 % SODIUM CHLORIDE (POUR BTL) OPTIME
TOPICAL | Status: DC | PRN
  Administered 2014-05-07: 250 mL

## 2014-05-07 MED ORDER — OXYCODONE HCL 5 MG PO TABS: 5.0000 mg | ORAL_TABLET | Freq: Four times a day (QID) | ORAL | Status: DC | PRN

## 2014-05-07 MED ORDER — ONDANSETRON HCL 4 MG/2ML IJ SOLN
INTRAMUSCULAR | Status: DC | PRN
  Administered 2014-05-07: 4 mg via INTRAVENOUS

## 2014-05-07 MED ORDER — SODIUM CHLORIDE 0.9 % IV SOLN
INTRAVENOUS | Status: DC | PRN
  Administered 2014-05-07: 07:00:00 via INTRAVENOUS

## 2014-05-07 MED ORDER — SCOPOLAMINE 1 MG/3DAYS TD PT72
MEDICATED_PATCH | TRANSDERMAL | Status: AC
  Administered 2014-05-07: 1.5 mg via TRANSDERMAL
  Filled 2014-05-07: qty 1

## 2014-05-07 MED ORDER — SCOPOLAMINE 1 MG/3DAYS TD PT72
1.0000 | MEDICATED_PATCH | TRANSDERMAL | Status: DC
  Administered 2014-05-07: 1 via TRANSDERMAL
  Administered 2014-05-07: 1.5 mg via TRANSDERMAL

## 2014-05-07 MED ORDER — FENTANYL CITRATE 0.05 MG/ML IJ SOLN
INTRAMUSCULAR | Status: AC
Start: 2014-05-07 — End: 2014-05-07
  Filled 2014-05-07: qty 5

## 2014-05-07 MED ORDER — MIDAZOLAM HCL 5 MG/5ML IJ SOLN
INTRAMUSCULAR | Status: DC | PRN
  Administered 2014-05-07: 2 mg via INTRAVENOUS

## 2014-05-07 MED ORDER — HEPARIN SODIUM (PORCINE) 1000 UNIT/ML IJ SOLN
INTRAMUSCULAR | Status: DC | PRN
  Administered 2014-05-07: 7000 [IU] via INTRAVENOUS

## 2014-05-07 MED ORDER — EPHEDRINE SULFATE 50 MG/ML IJ SOLN
INTRAMUSCULAR | Status: DC | PRN
  Administered 2014-05-07: 10 mg via INTRAVENOUS

## 2014-05-07 MED ORDER — MIDAZOLAM HCL 2 MG/2ML IJ SOLN
INTRAMUSCULAR | Status: AC
  Filled 2014-05-07: qty 2

## 2014-05-07 MED ORDER — FENTANYL CITRATE 0.05 MG/ML IJ SOLN
25.0000 ug | INTRAMUSCULAR | Status: DC | PRN
  Administered 2014-05-07 (×2): 50 ug via INTRAVENOUS

## 2014-05-07 SURGICAL SUPPLY — 34 items
ADH SKN CLS LQ APL DERMABOND (GAUZE/BANDAGES/DRESSINGS) ×1
ARMBAND PINK RESTRICT EXTREMIT (MISCELLANEOUS) ×2 IMPLANT
BLADE SURG 10 STRL SS (BLADE) ×2 IMPLANT
CANISTER SUCTION 2500CC (MISCELLANEOUS) ×2 IMPLANT
CANNULA VESSEL 3MM 2 BLNT TIP (CANNULA) ×2 IMPLANT
CLIP TI MEDIUM 6 (CLIP) ×2 IMPLANT
CLIP TI WIDE RED SMALL 6 (CLIP) ×4 IMPLANT
COVER PROBE W GEL 5X96 (DRAPES) ×2 IMPLANT
COVER SURGICAL LIGHT HANDLE (MISCELLANEOUS) ×2 IMPLANT
DECANTER SPIKE VIAL GLASS SM (MISCELLANEOUS) ×2 IMPLANT
DERMABOND ADHESIVE PROPEN (GAUZE/BANDAGES/DRESSINGS) ×1
DERMABOND ADVANCED .7 DNX6 (GAUZE/BANDAGES/DRESSINGS) ×1 IMPLANT
DRAIN PENROSE 1/4X12 LTX STRL (WOUND CARE) ×2 IMPLANT
ELECT REM PT RETURN 9FT ADLT (ELECTROSURGICAL) ×2
ELECTRODE REM PT RTRN 9FT ADLT (ELECTROSURGICAL) ×1 IMPLANT
GEL ULTRASOUND 20GR AQUASONIC (MISCELLANEOUS) IMPLANT
GLOVE BIO SURGEON STRL SZ7.5 (GLOVE) ×2 IMPLANT
GOWN STRL REUS W/ TWL LRG LVL3 (GOWN DISPOSABLE) ×3 IMPLANT
GOWN STRL REUS W/TWL LRG LVL3 (GOWN DISPOSABLE) ×6
KIT BASIN OR (CUSTOM PROCEDURE TRAY) ×2 IMPLANT
KIT ROOM TURNOVER OR (KITS) ×2 IMPLANT
LIQUID BAND (GAUZE/BANDAGES/DRESSINGS) ×4 IMPLANT
LOOP VESSEL MINI RED (MISCELLANEOUS) IMPLANT
NS IRRIG 1000ML POUR BTL (IV SOLUTION) ×2 IMPLANT
PACK CV ACCESS (CUSTOM PROCEDURE TRAY) ×2 IMPLANT
PAD ARMBOARD 7.5X6 YLW CONV (MISCELLANEOUS) ×4 IMPLANT
PROBE PENCIL 8 MHZ STRL DISP (MISCELLANEOUS) IMPLANT
SPONGE SURGIFOAM ABS GEL 100 (HEMOSTASIS) IMPLANT
SUT PROLENE 7 0 BV 1 (SUTURE) ×4 IMPLANT
SUT VIC AB 3-0 SH 27 (SUTURE) ×2
SUT VIC AB 3-0 SH 27X BRD (SUTURE) ×1 IMPLANT
SUT VICRYL 4-0 PS2 18IN ABS (SUTURE) ×2 IMPLANT
UNDERPAD 30X30 INCONTINENT (UNDERPADS AND DIAPERS) ×2 IMPLANT
WATER STERILE IRR 1000ML POUR (IV SOLUTION) ×2 IMPLANT

## 2014-05-07 NOTE — Interval H&P Note (Signed)
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 After consideration of risks, benefits and other options for treatment, the patient has consented to  Procedure(s): ARTERIOVENOUS (AV) FISTULA CREATION (Right) as a surgical intervention .  The patient's history has been reviewed, patient examined, no change in status, stable for surgery.  I have reviewed the patient's chart and labs.  Questions were answered to the patient's satisfaction.     FIELDS,CHARLES E

## 2014-05-07 NOTE — Transfer of Care (Signed)
Immediate Anesthesia Transfer of Care Note  Patient: Linda Ellison  Procedure(s) Performed: Procedure(s): ARTERIOVENOUS (AV) FISTULA CREATION (Right)  Patient Location: PACU  Anesthesia Type:General  Level of Consciousness: awake, alert  and sedated  Airway & Oxygen Therapy: Patient connected to face mask oxygen  Post-op Assessment: Report given to RN  Post vital signs: stable  Last Vitals:  Filed Vitals:   05/07/14 0925  BP:   Pulse:   Temp: 36.4 C  Resp:     Complications: No apparent anesthesia complications

## 2014-05-07 NOTE — Telephone Encounter (Addendum)
-----   Message from Mena Goes, RN sent at 05/07/2014  9:37 AM EST ----- Regarding: Schedule   ----- Message -----    From: Gabriel Earing, PA-C    Sent: 05/07/2014   9:11 AM      To: [facility] Charge Pool  S/p right BC AVF 05/07/14.  F/u with Dr. Oneida Alar in 6 weeks with duplex.  Thanks, Aldona Bar  05/07/14: lm for pt on home #, dpm

## 2014-05-07 NOTE — Anesthesia Procedure Notes (Signed)
Procedure Name: LMA Insertion Date/Time: 05/07/2014 7:31 AM Performed by: Maeola Harman Pre-anesthesia Checklist: Patient identified, Emergency Drugs available, Suction available, Patient being monitored and Timeout performed Patient Re-evaluated:Patient Re-evaluated prior to inductionOxygen Delivery Method: Circle system utilized Preoxygenation: Pre-oxygenation with 100% oxygen Intubation Type: IV induction Ventilation: Mask ventilation without difficulty LMA: LMA inserted LMA Size: 4.0 Number of attempts: 1 Placement Confirmation: positive ETCO2 and breath sounds checked- equal and bilateral Tube secured with: Tape Dental Injury: Teeth and Oropharynx as per pre-operative assessment

## 2014-05-07 NOTE — H&P (View-Only) (Signed)
She has a 39 year old female who presents today for evaluation of numbness and tingling in her left hand. She had a left radiocephalic AV fistula placed in May 2012. She states that over the last 3 months she has developed progressive numbness and tingling in digits 4 and 5 of her left hand especially when on dialysis. She occasionally has numbness and tingling in the entire hand. She does not have as much numbness in digits 12 and 3. She states that cannulation of her fistula has also become more difficult over time.  Past Medical History  Diagnosis Date  . Asthma   . ESRD on hemodialysis     started dialysis 01/29/11  . Weight loss   . Loss of appetite   . CHF (congestive heart failure)   . Hypertension     a. Also with hx of hypotension - has been started on midodrine.  . Hyperlipidemia   . Diabetes mellitus   . Peripheral neuropathy   . Iron deficiency anemia   . Hyperparathyroidism, secondary   . Sciatica   . GERD (gastroesophageal reflux disease)   . Adenoma   . Gastroparesis   . Esophageal erosions     a. Per EGD 10/2011.  Marland Kitchen CAD (coronary artery disease)     a. False positive stress echo 06/2012 at [facility] - cath with mild nonobstructive CAD at [facility] (mild luminal irregularities in LAD, moderate diffuse disease in distal RCA).  . History of echocardiogram     Echo 9/14: EF 55-60%, normal wall motion, normal diastolic function, PASP 21  . Chronic chest pain     a. H/o longstanding atypical CP. Nonobstructive cath 06/2012 and negative VQ.    Past Surgical History  Procedure Laterality Date  . Cesarean section    . Cholecystectomy  1996  . Av fistula placement  08/2010    Left radiocephalic AVF  . Ligation goretex fistula  01/04/11    Left AVF  . Colonoscopy    . Esophagogastroduodenoscopy  10/19/2011    Dr. Silvano Rusk  . Foot surgery    . Tubal ligation    . Left heart catheterization with coronary angiogram N/A 06/27/2012    Procedure: LEFT HEART CATHETERIZATION WITH  CORONARY ANGIOGRAM;  Surgeon: Larey Dresser, MD;  Location: [facility] CATH LAB;  Service: Cardiovascular;  Laterality: N/A;   Current Outpatient Prescriptions on File Prior to Visit  Medication Sig Dispense Refill  . albuterol (PROVENTIL HFA;VENTOLIN HFA) 108 (90 BASE) MCG/ACT inhaler Inhale 2 puffs into the lungs every 6 (six) hours as needed for wheezing or shortness of breath.    Marland Kitchen aspirin 81 MG tablet Take 81 mg by mouth daily.      . Calcium Carb-Cholecalciferol (CALCIUM 600 + D PO) Take 1 tablet by mouth daily.    Marland Kitchen gabapentin (NEURONTIN) 100 MG capsule Take 100-200 mg by mouth 2 (two) times daily. 100 mg in the am and 200 mg in evening    . hydrOXYzine (ATARAX/VISTARIL) 25 MG tablet Take 12.25 mg by mouth every 8 (eight) hours as needed (pruritis). 1/2-1 po q 8 hours prn pruritis    . Insulin Aspart Prot & Aspart (NOVOLOG MIX 70/30 [place]) Inject 15-20 Units into the skin 2 (two) times daily. 15 units every morning and 20 units every evening    . Insulin Detemir (LEVEMIR FLEXTOUCH) 100 UNIT/ML Pen Inject 20 Units into the skin at bedtime.    Marland Kitchen linagliptin (TRADJENTA) 5 MG TABS tablet Take 5 mg by mouth at bedtime.    Marland Kitchen  midodrine (PROAMATINE) 5 MG tablet Take 5 mg by mouth daily as needed (hypotension).    . multivitamin (RENA-VIT) TABS tablet Take 1 tablet by mouth daily.    Marland Kitchen omeprazole (PRILOSEC) 20 MG capsule Take 20 mg by mouth daily.    Marland Kitchen omeprazole (PRILOSEC) 20 MG capsule TAKE 1 CAPSULE BY MOUTH EVERY DAY 30 capsule 2  . sevelamer carbonate (RENVELA) 800 MG tablet Take 800-1,600 mg by mouth 3 (three) times daily with meals. 1600 mg with meals and 800 mg with snacks    . simvastatin (ZOCOR) 5 MG tablet Take 5 mg by mouth at bedtime.     No current facility-administered medications on file prior to visit.    Allergies  Allergen Reactions  . Ciprofloxacin Itching, Nausea And Vomiting and Hives  . Clindamycin/Lincomycin   . Fluocinolone Itching and Nausea And Vomiting  . Pineapple Itching     Makes tongue raw per patient.  . Strawberry Itching    Makes tongue raw per patient   Review of systems: She has asthma but this is been controlled. She denies shortness of breath. She denies chest pain.  Physical exam:  Filed Vitals:   04/25/14 1532  BP: 121/84  Pulse: 80  Height: 5' 6.5" (1.689 m)  Weight: 232 lb (105.235 kg)  SpO2: 98%    Chest: Clear to auscultation bilaterally  Cardiac: Regular rate and rhythm without murmur  Extremities: Left upper extremity palpable thrill in fistula. Left hand subjectively decreased sensation digits 4 and 5 compared to right. Motor strength 5 over 5 and symmetric to right.  Data: Patient had a duplex ultrasound today which showed increase in pressure in her left hand of approximately 50 mmHg with compression of the fistula  Assessment: Possible mild to moderate steal symptoms left upper extremity. The patient states that the symptoms are tolerable in the short-term but she would like to consider placement of a new hemodialysis access and then ligation of the left side after this is more developed.  Plan: We will bring her back next week for right upper extremity vein mapping. We will plan to place a new access on February 2 in the right arm. We will then consider ligation of the left radiocephalic fistula after the right arm access is usable.  Risks benefits possible complications and procedure details including but not limited to bleeding infection ischemic steal non-maturation of fistular access or occlusion of the access was described to the patient today she understands and agreed to proceed.  Ruta Hinds, MD [facility] [place] Office: [phone] Pager: [phone]

## 2014-05-07 NOTE — Discharge Instructions (Signed)
° ° °  05/07/2014 Linda Ellison [id number] 12/20/1975  Surgeon(s): Elam Dutch, MD  Procedure(s): ARTERIOVENOUS (AV) FISTULA CREATION-right brachial-cephalic AVF   Do not stick fistula for 12 weeks   What to eat:  For your first meals, you should eat lightly; only small meals initially.  If you do not have nausea, you may eat larger meals.  Avoid spicy, greasy and heavy food.     General Anesthesia, Adult, Care After  Refer to this sheet in the next few weeks. These instructions provide you with information on caring for yourself after your procedure. Your health care provider may also give you more specific instructions. Your treatment has been planned according to current medical practices, but problems sometimes occur. Call your health care provider if you have any problems or questions after your procedure.  WHAT TO EXPECT AFTER THE PROCEDURE  After the procedure, it is typical to experience:  Sleepiness.  Nausea and vomiting. HOME CARE INSTRUCTIONS  For the first 24 hours after general anesthesia:  Have a responsible person with you.  Do not drive a car. If you are alone, do not take public transportation.  Do not drink alcohol.  Do not take medicine that has not been prescribed by your health care provider.  Do not sign important papers or make important decisions.  You may resume a normal diet and activities as directed by your health care provider.  Change bandages (dressings) as directed.  If you have questions or problems that seem related to general anesthesia, call the hospital and ask for the anesthetist or anesthesiologist on call. SEEK MEDICAL CARE IF:  You have nausea and vomiting that continue the day after anesthesia.  You develop a rash. SEEK IMMEDIATE MEDICAL CARE IF:  You have difficulty breathing.  You have chest pain.  You have any allergic problems. Document Released: 06/28/2000 Document Revised: 11/22/2012 Document Reviewed: 10/05/2012  [facility]  Patient Information 2014 [place], [place].

## 2014-05-07 NOTE — Op Note (Signed)
Procedure: Left Brachial Cephalic AV fistula  Preop: ESRD  Postop: ESRD  Anesthesia: General  Assistant:  Leontine Locket PA-c  Findings: 3.5 mm cephalic vein  Procedure: After obtaining informed consent, the patient was taken to the operating room.  After induction of general anesthesia, the left upper extremity was prepped and draped in usual sterile fashion.  A transverse incision was then made near the antecubital crease the left arm. The incision was carried into the subcutaneous tissues down to level of the cephalic vein. The cephalic vein was approximately 3.5 mm in diameter. It was of good quality. This was dissected free circumferentially and small side branches ligated and divided between silk ties or clips. Next the brachial artery was dissected free in the medial portion of the incision. The artery was  3-4 mm in diameter. The vessel loops were placed proximal and distal to the planned site of arteriotomy. The patient was given 7000 units of intravenous heparin. After appropriate circulation time, the vessel loops were used to control the artery. A longitudinal opening was made in the brachial artery.  The vein was ligated distally with a 2-0 silk tie. The vein was controlled proximally with a fine bulldog clamp. The vein was then swung over to the artery and sewn end of vein to side of artery using a running 7-0 Prolene suture. Just prior to completion of the anastomosis, everything was forebled backbled and thoroughly flushed. The anastomosis was secured, vessel loops released, and there was a palpable thrill in the fistula immediately. After hemostasis was obtained, the subcutaneous tissues were reapproximated using a running 3-0 Vicryl suture. The skin was then closed with a 4 0 Vicryl subcuticular stitch. Dermabond was applied to the skin incision.  The patient had a palpable radial pulse at the end of the case.  Linda Hinds, MD [facility] [place] Office:  [phone] Pager: [phone]

## 2014-05-07 NOTE — Anesthesia Postprocedure Evaluation (Signed)
  Anesthesia Post-op Note  Patient: Linda Ellison  Procedure(s) Performed: Procedure(s): ARTERIOVENOUS (AV) FISTULA CREATION (Right)  Patient Location: PACU  Anesthesia Type:General  Level of Consciousness: awake and alert   Airway and Oxygen Therapy: Patient Spontanous Breathing and Patient connected to nasal cannula oxygen  Post-op Pain: mild  Post-op Assessment: Post-op Vital signs reviewed, Patient's Cardiovascular Status Stable, Respiratory Function Stable, RESPIRATORY FUNCTION UNSTABLE and No signs of Nausea or vomiting  Post-op Vital Signs: Reviewed and stable  Last Vitals:  Filed Vitals:   05/07/14 0953  BP:   Pulse: 88  Temp:   Resp: 10    Complications: No apparent anesthesia complications

## 2014-05-09 ENCOUNTER — Emergency Department (HOSPITAL_COMMUNITY)
Admission: EM | Admit: 2014-05-09 | Discharge: 2014-05-10 | Disposition: A | Discharge status: Home / Self Care | Payer: Medicare Other | Attending: Emergency Medicine | Admitting: Emergency Medicine

## 2014-05-09 ENCOUNTER — Ambulatory Visit (INDEPENDENT_AMBULATORY_CARE_PROVIDER_SITE_OTHER): Payer: Medicare Other | Admitting: Emergency Medicine

## 2014-05-09 ENCOUNTER — Telehealth: Payer: Self-pay

## 2014-05-09 ENCOUNTER — Encounter (HOSPITAL_COMMUNITY): Payer: Self-pay | Admitting: Vascular Surgery

## 2014-05-09 ENCOUNTER — Encounter (HOSPITAL_COMMUNITY): Payer: Self-pay | Admitting: Emergency Medicine

## 2014-05-09 VITALS — HR 104 | Temp 98.3°F | Resp 18 | Ht 66.0 in | Wt 230.4 lb

## 2014-05-09 DIAGNOSIS — E119 Type 2 diabetes mellitus without complications: Secondary | ICD-10-CM | POA: Diagnosis not present

## 2014-05-09 DIAGNOSIS — Z79899 Other long term (current) drug therapy: Secondary | ICD-10-CM | POA: Diagnosis not present

## 2014-05-09 DIAGNOSIS — G8918 Other acute postprocedural pain: Secondary | ICD-10-CM | POA: Insufficient documentation

## 2014-05-09 DIAGNOSIS — G8929 Other chronic pain: Secondary | ICD-10-CM | POA: Diagnosis not present

## 2014-05-09 DIAGNOSIS — Z7982 Long term (current) use of aspirin: Secondary | ICD-10-CM | POA: Diagnosis not present

## 2014-05-09 DIAGNOSIS — E785 Hyperlipidemia, unspecified: Secondary | ICD-10-CM | POA: Diagnosis not present

## 2014-05-09 DIAGNOSIS — I120 Hypertensive chronic kidney disease with stage 5 chronic kidney disease or end stage renal disease: Secondary | ICD-10-CM | POA: Diagnosis not present

## 2014-05-09 DIAGNOSIS — Z794 Long term (current) use of insulin: Secondary | ICD-10-CM | POA: Diagnosis not present

## 2014-05-09 DIAGNOSIS — Z862 Personal history of diseases of the blood and blood-forming organs and certain disorders involving the immune mechanism: Secondary | ICD-10-CM | POA: Diagnosis not present

## 2014-05-09 DIAGNOSIS — I509 Heart failure, unspecified: Secondary | ICD-10-CM | POA: Diagnosis not present

## 2014-05-09 DIAGNOSIS — K219 Gastro-esophageal reflux disease without esophagitis: Secondary | ICD-10-CM | POA: Insufficient documentation

## 2014-05-09 DIAGNOSIS — E875 Hyperkalemia: Secondary | ICD-10-CM | POA: Diagnosis not present

## 2014-05-09 DIAGNOSIS — N186 End stage renal disease: Secondary | ICD-10-CM | POA: Insufficient documentation

## 2014-05-09 DIAGNOSIS — M25521 Pain in right elbow: Secondary | ICD-10-CM | POA: Insufficient documentation

## 2014-05-09 DIAGNOSIS — E213 Hyperparathyroidism, unspecified: Secondary | ICD-10-CM | POA: Diagnosis not present

## 2014-05-09 DIAGNOSIS — I2510 Atherosclerotic heart disease of native coronary artery without angina pectoris: Secondary | ICD-10-CM | POA: Insufficient documentation

## 2014-05-09 DIAGNOSIS — Z992 Dependence on renal dialysis: Secondary | ICD-10-CM | POA: Insufficient documentation

## 2014-05-09 DIAGNOSIS — J45909 Unspecified asthma, uncomplicated: Secondary | ICD-10-CM | POA: Diagnosis not present

## 2014-05-09 DIAGNOSIS — T82848A Pain from vascular prosthetic devices, implants and grafts, initial encounter: Secondary | ICD-10-CM

## 2014-05-09 DIAGNOSIS — I770 Arteriovenous fistula, acquired: Secondary | ICD-10-CM

## 2014-05-09 NOTE — Telephone Encounter (Signed)
Phone call from pt.  Requested to have change in her pain medication. Reported the Oxycodone 5 mg IR tablet has caused itching and nausea.  Stated she has an area of rash on the side of her abdomen. Reported her pain level is at 8/10 at this time.  Discussed with Dr. Oneida Alar.  Recommended pt. to take ES Tylenol for pain; stated he didn't feel, at this point, she should require a narcotic pain medication. Phone call to pt.  Made aware of the recommendation by Dr. Oneida Alar.  Pt. stated she has been using ES Tylenol, and it doesn't help.  Advised to position the left arm on pillows, above the level of the heart, at intervals, and to give it more time.  Advised that the pain should get better each day.  Pt. Verb. Understanding.

## 2014-05-09 NOTE — Progress Notes (Signed)
[facility] [address], [place] [address] [phone]  Date:  05/09/2014   Name:  Linda Ellison   DOB:  18-May-1975   MRN:  [id number]  PCP:  Marijean Bravo, MD    Chief Complaint: Medication Reaction   History of Present Illness:  Linda Ellison is a 39 y.o. very pleasant female patient who presents with the following:  Underwent a fistula creation in the right arm for hemodialysis access.  Now has swelling and redness and warmth of the site Concerned she may have infection No fever or chills No improvement with over the counter medications or other home remedies.  Denies other complaint or health concern today.   Patient Active Problem List   Diagnosis Date Noted  . ESRD on hemodialysis   . CAD (coronary artery disease)   . Chronic chest pain   . Hypertension   . Chronic constipation 04/10/2013  . Personal history of adenomatouscolonic polyps 10/26/2011  . Type I (juvenile type) diabetes mellitus with renal manifestations, not stated as uncontrolled 08/11/2011  . CHOLECYSTECTOMY, HX OF 12/21/2008  . HYPERLIPIDEMIA 08/08/2008  . OBESITY, UNSPECIFIED 08/08/2008  . GERD 08/08/2008  . CHICKENPOX, HX OF 08/08/2008  . TENSION HEADACHE 03/24/2006  . Unspecified asthma 03/24/2006  . DEPRESSION, HX OF 03/24/2006  . HYPERTENSION 04/06/1999    Past Medical History  Diagnosis Date  . Asthma   . ESRD on hemodialysis     started dialysis 01/29/11  . Weight loss   . Loss of appetite   . CHF (congestive heart failure)   . Hypertension     a. Also with hx of hypotension - has been started on midodrine.  . Hyperlipidemia   . Diabetes mellitus   . Peripheral neuropathy   . Iron deficiency anemia   . Hyperparathyroidism, secondary   . Sciatica   . GERD (gastroesophageal reflux disease)   . Adenoma   . Gastroparesis   . Esophageal erosions     a. Per EGD 10/2011.  Marland Kitchen CAD (coronary artery disease)     a. False positive stress echo 06/2012 at  [facility] - cath with mild nonobstructive CAD at [facility] (mild luminal irregularities in LAD, moderate diffuse disease in distal RCA).  . History of echocardiogram     Echo 9/14: EF 55-60%, normal wall motion, normal diastolic function, PASP 21  . Chronic chest pain     a. H/o longstanding atypical CP. Nonobstructive cath 06/2012 and negative VQ.  Marland Kitchen PONV (postoperative nausea and vomiting)     Past Surgical History  Procedure Laterality Date  . Cesarean section    . Cholecystectomy  1996  . Av fistula placement  08/2010    Left radiocephalic AVF  . Ligation goretex fistula  01/04/11    Left AVF  . Colonoscopy    . Esophagogastroduodenoscopy  10/19/2011    Dr. Silvano Rusk  . Foot surgery    . Tubal ligation    . Left heart catheterization with coronary angiogram N/A 06/27/2012    Procedure: LEFT HEART CATHETERIZATION WITH CORONARY ANGIOGRAM;  Surgeon: Larey Dresser, MD;  Location: [facility] CATH LAB;  Service: Cardiovascular;  Laterality: N/A;  . Fracture surgery      left femur  . Dilation and curettage of uterus    . Av fistula placement Right 05/07/2014    Procedure: ARTERIOVENOUS (AV) FISTULA CREATION;  Surgeon: Elam Dutch, MD;  Location: [place];  Service: Vascular;  Laterality: Right;  History  Substance Use Topics  . Smoking status: Never Smoker   . Smokeless tobacco: Never Used  . Alcohol Use: No    Family History  Problem Relation Age of Onset  . Heart disease Mother     heart attack at 36 y/o-infected valve  . Kidney disease Mother     was a dialysis patient  . Heart disease Brother   . Kidney disease Maternal Grandmother     pre-dialysis  . Kidney failure Paternal Uncle   . Kidney failure Paternal Aunt   . Diabetes Father   . Hypertension Father   . Colon cancer Neg Hx   . Arthritis Brother   . Diabetes Maternal Aunt   . Hypertension Maternal Aunt   . Diabetes Paternal Aunt   . Heart disease Paternal Aunt   . Hypertension Paternal Aunt     Allergies   Allergen Reactions  . Ciprofloxacin Itching, Nausea And Vomiting and Hives  . Clindamycin/Lincomycin   . Fluocinolone Itching and Nausea And Vomiting  . Pineapple Itching    Makes tongue raw per patient.  . Strawberry Itching    Makes tongue raw per patient  . Adhesive [Tape] Rash    Medication list has been reviewed and updated.  Current Outpatient Prescriptions on File Prior to Visit  Medication Sig Dispense Refill  . albuterol (PROVENTIL HFA;VENTOLIN HFA) 108 (90 BASE) MCG/ACT inhaler Inhale 2 puffs into the lungs every 6 (six) hours as needed for wheezing or shortness of breath.    Marland Kitchen aspirin 81 MG tablet Take 81 mg by mouth daily.      . Calcium Carb-Cholecalciferol (CALCIUM 600 + D PO) Take 1 tablet by mouth daily.    Marland Kitchen gabapentin (NEURONTIN) 100 MG capsule Take 100-200 mg by mouth 2 (two) times daily. 100 mg in the am and 200 mg in evening    . hydrOXYzine (ATARAX/VISTARIL) 25 MG tablet Take 12.25 mg by mouth every 8 (eight) hours as needed (pruritis). 1/2-1 po q 8 hours prn pruritis    . Insulin Aspart Prot & Aspart (NOVOLOG MIX 70/30 [place]) Inject 35-45 Units into the skin 2 (two) times daily. 45 units every morning and 35 units every evening    . Insulin Detemir (LEVEMIR FLEXTOUCH) 100 UNIT/ML Pen Inject 20 Units into the skin at bedtime.    Marland Kitchen linagliptin (TRADJENTA) 5 MG TABS tablet Take 5 mg by mouth at bedtime.    . methimazole (TAPAZOLE) 10 MG tablet Take 10 mg by mouth 2 (two) times daily.    . multivitamin (RENA-VIT) TABS tablet Take 1 tablet by mouth daily.    Marland Kitchen omeprazole (PRILOSEC) 20 MG capsule TAKE 1 CAPSULE BY MOUTH EVERY DAY 30 capsule 2  . oxyCODONE (ROXICODONE) 5 MG immediate release tablet Take 1 tablet (5 mg total) by mouth every 6 (six) hours as needed. 20 tablet 0  . sevelamer carbonate (RENVELA) 800 MG tablet Take 800-1,600 mg by mouth 3 (three) times daily with meals. 1600 mg with meals and 800 mg with snacks    . simvastatin (ZOCOR) 5 MG tablet Take 5 mg by  mouth at bedtime.    . sodium polystyrene (KAYEXALATE) powder Take by mouth once. 30 grams PO today and 30 g Po tomorrow 60 g 0  . HYDROcodone-acetaminophen (NORCO/VICODIN) 5-325 MG per tablet Take 1 tablet by mouth every 6 (six) hours as needed for moderate pain. (Patient not taking: Reported on 05/09/2014) 15 tablet 0   No current facility-administered medications on file prior  to visit.    Review of Systems:  As per HPI, otherwise negative.    Physical Examination: Filed Vitals:   05/09/14 1954  Pulse: 104  Temp: 98.3 F (36.8 C)  Resp: 18   Filed Vitals:   05/09/14 1954  Height: 5\' 6"  (1.676 m)  Weight: 230 lb 6.4 oz (104.509 kg)   Body mass index is 37.21 kg/(m^2). Ideal Body Weight: Weight in (lb) to have BMI = 25: 154.6   GEN: WDWN, NAD, Non-toxic, Alert & Oriented x 3 HEENT: Atraumatic, Normocephalic.  Ears and Nose: No external deformity. EXTR: No clubbing/cyanosis/edema NEURO: Normal gait.  PSYCH: Normally interactive. Conversant. Not depressed or anxious appearing.  Calm demeanor.  RIGHT antecubital area tender, swollen and warm.  Radial pulse is diminished.  No thrill in arm at surgical site  Assessment and Plan: Possibly clotted fistula To ER  Signed,  Ellison Carwin, MD

## 2014-05-09 NOTE — ED Notes (Signed)
Pt ambulated to bathroom. Steady gait. Family with pt in bathroom. Family informed to ask staff if assistance is needed.

## 2014-05-09 NOTE — ED Notes (Signed)
Pt had fistula placed in right arm on Tues.  Now right arm swollen, red and warm to touch.

## 2014-05-09 NOTE — ED Provider Notes (Signed)
CSN: [id number]     Arrival date & time 05/09/14  2051 History   First MD Initiated Contact with Patient 05/09/14 2259     Chief Complaint  Patient presents with  . 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 She is 2 days s/p [facility] AVF creation in her RUE by Dr. Oneida Alar. 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 She states she missed her dialysis yesterday.  She is c/o pain and redness at the site of her new AVF. She states it is warm to the touch and also swollen. She describes the pain as sharp/aching and tightening. She has been taking Oxycodone for pain which helps, but causes her to itch and feel nauseous; therefore she discontinued it. She has been trying to take Tylenol ES, but this provides no relief of her pain. She denies associated fever, pallor of her RUE, and red linear streaking. She is not on chronic blood thinners.  PCP - Dr. Jobe Igo.  The history is provided by the patient. No language interpreter was used.    Past Medical History  Diagnosis Date  . Asthma   . ESRD on hemodialysis     started dialysis 01/29/11  . Weight loss   . Loss of appetite   . CHF (congestive heart failure)   . Hypertension     a. Also with hx of hypotension - has been started on midodrine.  . Hyperlipidemia   . Diabetes mellitus   . Peripheral neuropathy   . Iron deficiency anemia   . Hyperparathyroidism, secondary   . Sciatica   . GERD (gastroesophageal reflux disease)   . Adenoma   . Gastroparesis   . Esophageal erosions     a. Per EGD 10/2011.  Marland Kitchen CAD (coronary artery disease)     a. False positive stress echo 06/2012 at [facility] - cath with mild nonobstructive CAD at [facility] (mild luminal irregularities in LAD, moderate diffuse disease in distal RCA).  .  History of echocardiogram     Echo 9/14: EF 55-60%, normal wall motion, normal diastolic function, PASP 21  . Chronic chest pain     a. H/o longstanding atypical CP. Nonobstructive cath 06/2012 and negative VQ.  Marland Kitchen PONV (postoperative nausea and vomiting)    Past Surgical History  Procedure Laterality Date  . Cesarean section    . Cholecystectomy  1996  . Av fistula placement  08/2010    Left radiocephalic AVF  . Ligation goretex fistula  01/04/11    Left AVF  . Colonoscopy    . Esophagogastroduodenoscopy  10/19/2011    Dr. Silvano Rusk  . Foot surgery    . Tubal ligation    . Left heart catheterization with coronary angiogram N/A 06/27/2012    Procedure: LEFT HEART CATHETERIZATION WITH CORONARY ANGIOGRAM;  Surgeon: Larey Dresser, MD;  Location: [facility] CATH LAB;  Service: Cardiovascular;  Laterality: N/A;  . Fracture surgery      left femur  . Dilation and curettage of uterus    . Av fistula placement Right 05/07/2014    Procedure: ARTERIOVENOUS (AV) FISTULA CREATION;  Surgeon: Elam Dutch, MD;  Location: [facility] OR;  Service: Vascular;  Laterality: Right;   Family History  Problem Relation Age of Onset  . Heart disease Mother     heart  attack at 66 y/o-infected valve  . Kidney disease Mother     was a dialysis patient  . Heart disease Brother   . Kidney disease Maternal Grandmother     pre-dialysis  . Kidney failure Paternal Uncle   . Kidney failure Paternal Aunt   . Diabetes Father   . Hypertension Father   . Colon cancer Neg Hx   . Arthritis Brother   . Diabetes Maternal Aunt   . Hypertension Maternal Aunt   . Diabetes Paternal Aunt   . Heart disease Paternal Aunt   . Hypertension Paternal Aunt    History  Substance Use Topics  . Smoking status: Never Smoker   . Smokeless tobacco: Never Used  . Alcohol Use: No   OB History    No data available      Review of Systems  Constitutional: Positive for chills. Negative for fever.  Gastrointestinal: Positive for nausea  (resolved after d/c Oxycodone) and vomiting (resolved after d/c Oxycodone).  Musculoskeletal: Positive for myalgias.  Skin: Positive for color change. Negative for pallor.  Neurological: Negative for weakness and numbness.  All other systems reviewed and are negative.   Allergies  Ciprofloxacin; Fluocinolone; Pineapple; Strawberry; Adhesive; Clindamycin/lincomycin; and Oxycodone  Home Medications   Prior to Admission medications   Medication Sig Start Date End Date Taking? Authorizing Provider  albuterol (PROVENTIL HFA;VENTOLIN HFA) 108 (90 BASE) MCG/ACT inhaler Inhale 2 puffs into the lungs every 6 (six) hours as needed for wheezing or shortness of breath.   Yes Historical Provider, MD  aspirin 81 MG tablet Take 81 mg by mouth daily.     Yes Historical Provider, MD  Calcium Carb-Cholecalciferol (CALCIUM 600 + D PO) Take 1 tablet by mouth daily.   Yes Historical Provider, MD  gabapentin (NEURONTIN) 100 MG capsule Take 100-200 mg by mouth 2 (two) times daily. 100 mg in the am and 200 mg in evening   Yes Historical Provider, MD  hydrOXYzine (ATARAX/VISTARIL) 25 MG tablet Take 25 mg by mouth at bedtime. 1/2-1 po q 8 hours prn pruritis 06/01/13 06/01/14 Yes Historical Provider, MD  Insulin Aspart Prot & Aspart (NOVOLOG MIX 70/30 Agency) Inject 35-45 Units into the skin 2 (two) times daily. 45 units every morning and 35 units every evening   Yes Historical Provider, MD  Insulin Detemir (LEVEMIR FLEXTOUCH) 100 UNIT/ML Pen Inject 20 Units into the skin at bedtime. 11/17/13  Yes Historical Provider, MD  linagliptin (TRADJENTA) 5 MG TABS tablet Take 5 mg by mouth at bedtime.   Yes Historical Provider, MD  methimazole (TAPAZOLE) 10 MG tablet Take 10 mg by mouth 2 (two) times daily. 04/20/14  Yes Historical Provider, MD  multivitamin (RENA-VIT) TABS tablet Take 1 tablet by mouth daily.   Yes Historical Provider, MD  omeprazole (PRILOSEC) 20 MG capsule TAKE 1 CAPSULE BY MOUTH EVERY DAY 04/10/14  Yes Gatha Mayer, MD  sevelamer carbonate (RENVELA) 800 MG tablet Take 800-1,600 mg by mouth 3 (three) times daily with meals. 1600 mg with meals and 800 mg with snacks   Yes Historical Provider, MD  simvastatin (ZOCOR) 5 MG tablet Take 5 mg by mouth at bedtime.   Yes Historical Provider, MD  topiramate (TOPAMAX) 50 MG tablet Take 50 mg by mouth 2 (two) times daily.   Yes Historical Provider, MD  HYDROcodone-acetaminophen (NORCO/VICODIN) 5-325 MG per tablet Take 1 tablet by mouth every 6 (six) hours as needed for moderate pain. 05/10/14   Antonietta Breach, PA-C  oxyCODONE (ROXICODONE) 5  MG immediate release tablet Take 1 tablet (5 mg total) by mouth every 6 (six) hours as needed. Patient not taking: Reported on 05/09/2014 05/07/14   Aldona Bar J Rhyne, PA-C  sodium polystyrene (KAYEXALATE) powder Take by mouth once. 30 grams PO today and 30 g Po tomorrow Patient not taking: Reported on 05/09/2014 05/04/14   Resa Miner Lawyer, PA-C   BP 148/91 mmHg  Pulse 97  Temp(Src) 97.9 F (36.6 C) (Oral)  Resp 24  SpO2 99%  LMP 04/29/2014   Physical Exam  Constitutional: She is oriented to person, place, and time. She appears well-developed and well-nourished. No distress.  Nontoxic/nonseptic appearing  HENT:  Head: Normocephalic and atraumatic.  Eyes: Conjunctivae and EOM are normal. No scleral icterus.  Neck: Normal range of motion.  Cardiovascular: Normal rate, regular rhythm and intact distal pulses.   Distal radial pulse 2+ b/l. Palpable thrill over left AVF. No distinct thrill noted over palpation of surgical site/new AVF location in RUE. Capillary refill brisk in all digits.  Pulmonary/Chest: Effort normal. No respiratory distress.  Respirations even and unlabored  Musculoskeletal: Normal range of motion.       Right elbow: She exhibits swelling. She exhibits normal range of motion and no deformity. Tenderness found.       Arms: Neurological: She is alert and oriented to person, place, and time. She exhibits  normal muscle tone. Coordination normal.  Sensation to light touch intact  Skin: Skin is warm and dry. No rash noted. She is not diaphoretic. No erythema. No pallor.  Psychiatric: She has a normal mood and affect. Her behavior is normal.  Nursing note and vitals reviewed.   ED Course  Procedures (including critical care time) Labs Review Labs Reviewed  CBC WITH DIFFERENTIAL/PLATELET - Abnormal; Notable for the following:    Hemoglobin 10.7 (*)    HCT 34.0 (*)    All other components within normal limits  BASIC METABOLIC PANEL - Abnormal; Notable for the following:    Sodium 134 (*)    Potassium 5.8 (*)    Glucose, Bld 274 (*)    BUN 40 (*)    Creatinine, Ser 8.51 (*)    GFR calc non Af Amer 5 (*)    GFR calc Af Amer 6 (*)    All other components within normal limits  I-STAT CG4 LACTIC ACID, ED   2345 - 20 gauge EJ peripheral line inserted by this writer at bedside without complications  Imaging Review No results found.   EKG Interpretation None      MDM   Final diagnoses:  Pain from A/V fistula  Hyperkalemia    39 year old female with a number of comorbidities presents to the emergency department for further evaluation of pain to her AV fistula. She is 2 days status post A/V fistula creation in her right upper extremity by Dr. Oneida Alar. Patient is neurovascularly intact. She does have some mild redness noted to the surgical site. She is afebrile and hemodynamically stable. Patient without leukocytosis today. Normal lactate.  Case discussed with Dr. Bridgett Larsson vascular surgery. He states that patient's AV fistula site is highly unlikely to become infected. He states that chances for infection increase when a synthetic graft is used, but rare when [facility] vein is used for creation of a fistula. He states that it is possible that the fistula creation may have clotted, but this would be considered failure of the fistula and no emergent intervention would be pursued in this case. He  recommends changing patient's  outpatient pain medication to Norco and f/u with Dr. Oneida Alar as needed in his office.  Patient was noted to have a hyperkalemia of 5.8 today. She has had a similar presentation in the past. Her hyperkalemia is likely secondary to patient missing dialysis yesterday. She is scheduled for dialysis tomorrow. I did touch base with Dr. Moshe Cipro of nephrology who does not believe the patient requires any additional intervention for her hyperkalemia at this time. She believes the patient stable for discharge and instruction to follow-up for hemodialysis as scheduled (now in <12 hours).   All findings and consultation input has been reviewed with the patient. Patient verbalizes comfort and understanding with this plan. Patient stable for discharge at this time. She has no unaddressed concerns. Patient discharged in good condition.   Filed Vitals:   05/09/14 2245 05/09/14 2300 05/10/14 0030 05/10/14 0142  BP: 142/79 155/94 148/91 140/92  Pulse: 98 97 97   Temp:    98 F (36.7 C)  TempSrc:      Resp: 26 21 24    SpO2: 100% 100% 99%        Antonietta Breach, PA-C 05/10/14 0150  Julianne Rice, MD 05/10/14 [phone]

## 2014-05-10 DIAGNOSIS — G8918 Other acute postprocedural pain: Secondary | ICD-10-CM | POA: Diagnosis not present

## 2014-05-10 LAB — CBC WITH DIFFERENTIAL/PLATELET
BASOS ABS: 0 10*3/uL (ref 0.0–0.1)
Basophils Relative: 0 % (ref 0–1)
EOS PCT: 2 % (ref 0–5)
Eosinophils Absolute: 0.1 10*3/uL (ref 0.0–0.7)
HEMATOCRIT: 34 % — AB (ref 36.0–46.0)
Hemoglobin: 10.7 g/dL — ABNORMAL LOW (ref 12.0–15.0)
LYMPHS ABS: 1.3 10*3/uL (ref 0.7–4.0)
Lymphocytes Relative: 22 % (ref 12–46)
MCH: 27 pg (ref 26.0–34.0)
MCHC: 31.5 g/dL (ref 30.0–36.0)
MCV: 85.9 fL (ref 78.0–100.0)
Monocytes Absolute: 0.5 10*3/uL (ref 0.1–1.0)
Monocytes Relative: 8 % (ref 3–12)
NEUTROS ABS: 4.1 10*3/uL (ref 1.7–7.7)
Neutrophils Relative %: 68 % (ref 43–77)
Platelets: 235 10*3/uL (ref 150–400)
RBC: 3.96 MIL/uL (ref 3.87–5.11)
RDW: 14 % (ref 11.5–15.5)
WBC: 6 10*3/uL (ref 4.0–10.5)

## 2014-05-10 LAB — BASIC METABOLIC PANEL
Anion gap: 8 (ref 5–15)
BUN: 40 mg/dL — ABNORMAL HIGH (ref 6–23)
CALCIUM: 9.1 mg/dL (ref 8.4–10.5)
CHLORIDE: 104 mmol/L (ref 96–112)
CO2: 22 mmol/L (ref 19–32)
Creatinine, Ser: 8.51 mg/dL — ABNORMAL HIGH (ref 0.50–1.10)
GFR calc Af Amer: 6 mL/min — ABNORMAL LOW (ref >90)
GFR calc non Af Amer: 5 mL/min — ABNORMAL LOW (ref >90)
GLUCOSE: 274 mg/dL — AB (ref 70–99)
Potassium: 5.8 mmol/L — ABNORMAL HIGH (ref 3.5–5.1)
Sodium: 134 mmol/L — ABNORMAL LOW (ref 135–145)

## 2014-05-10 LAB — I-STAT CG4 LACTIC ACID, ED: LACTIC ACID, VENOUS: 0.71 mmol/L (ref 0.5–2.0)

## 2014-05-10 MED ORDER — FENTANYL CITRATE 0.05 MG/ML IJ SOLN
50.0000 ug | Freq: Once | INTRAMUSCULAR | Status: AC
  Administered 2014-05-10: 50 ug via INTRAVENOUS
  Filled 2014-05-10: qty 2

## 2014-05-10 MED ORDER — HYDROCODONE-ACETAMINOPHEN 5-325 MG PO TABS: 1.0000 | ORAL_TABLET | Freq: Four times a day (QID) | ORAL | Status: DC | PRN

## 2014-05-10 NOTE — Discharge Instructions (Signed)
AV Fistula, Care After Refer to this sheet in the next few weeks. These instructions provide you with information on caring for yourself after your procedure. Your caregiver may also give you more specific instructions. Your treatment has been planned according to current medical practices, but problems sometimes occur. Call your caregiver if you have any problems or questions after your procedure. HOME CARE INSTRUCTIONS   Do not drive a car or take public transportation alone.  Do not drink alcohol.  Only take medicine that has been prescribed by your caregiver.  Do not sign important papers or make important decisions.  Have a responsible person with you.  Ask your caregiver to show you how to check your access at home for a vibration (called a "thrill") or for a sound (called a "bruit" pronounced brew-ee).  Your vein will need time to enlarge and mature so needles can be inserted for dialysis. Follow your caregiver's instructions about what you need to do to make this happen.  Keep dressings clean and dry.  Keep the arm elevated above your heart. Use a pillow.  Rest.  Use the arm as usual for all activities.  Have the stitches or tape closures removed in 10 to 14 days, or as directed by your caregiver.  Do not sleep or lie on the area of the fistula or that arm. This may decrease or stop the blood flow through your fistula.  Do not allow blood pressures to be taken on this arm.  Do not allow blood drawing to be done from the graft.  Do not wear tight clothing around the access site or on the arm.  Avoid lifting heavy objects with the arm that has the fistula.  Do not use creams or lotions over the access site. SEEK MEDICAL CARE IF:   You have a fever.  You have swelling around the fistula that gets worse, or you have new pain.  You have unusual bleeding at the fistula site or from any other area.  You have pus or other drainage at the fistula site.  You have skin  redness or red streaking on the skin around, above, or below the fistula site.  Your access site feels warm.  You have any flu-like symptoms. SEEK IMMEDIATE MEDICAL CARE IF:   You have pain, numbness, or an unusual pale skin on the hand or on the side of your fistula.  You have dizziness or weakness that you have not had before.  You have shortness of breath.  You have chest pain.  Your fistula disconnects or breaks, and there is bleeding that cannot be easily controlled. Call for local emergency medical help. Do not try to drive yourself to the hospital. MAKE SURE YOU  Understand these instructions.  Will watch your condition.  Will get help right away if you are not doing well or get worse. Document Released: 03/22/2005 Document Revised: 08/06/2013 Document Reviewed: 09/09/2010 [facility] Patient Information 2015 [place], [place]. This information is not intended to replace advice given to you by your health care provider. Make sure you discuss any questions you have with your health care provider.  Hyperkalemia Hyperkalemia is when you have too much potassium in your blood. This can be a life-threatening condition. Potassium is normally removed (excreted) from the body by the kidneys. CAUSES  The potassium level in your body can become too high for the following reasons:  You take in too much potassium. You can do this by:  Using salt substitutes. They contain large  amounts of potassium.  Taking potassium supplements from your caregiver. The dose may be too high for you.  Eating foods or taking nutritional products with potassium.  You excrete too little potassium. This can happen if:  Your kidneys are not functioning properly. Kidney (renal) disease is a very common cause of hyperkalemia.  You are taking medicines that lower your excretion of potassium, such as certain diuretic medicines.  You have an adrenal gland disease called Addison's disease.  You have a urinary  tract obstruction, such as kidney stones.  You are on treatment to mechanically clean your blood (dialysis) and you skip a treatment.  You release a high amount of potassium from your cells into your blood. You may have a condition that causes potassium to move from your cells to your bloodstream. This can happen with:  Injury to muscles or other tissues. Most potassium is stored in the muscles.  Severe burns or infections.  Acidic blood plasma (acidosis). Acidosis can result from many diseases, such as uncontrolled diabetes. SYMPTOMS  Usually, there are no symptoms unless the potassium is dangerously high or has risen very quickly. Symptoms may include:  Irregular or very slow heartbeat.  Feeling sick to your stomach (nauseous).  Tiredness (fatigue).  Nerve problems such as tingling of the skin, numbness of the hands or feet, weakness, or paralysis. DIAGNOSIS  A simple blood test can measure the amount of potassium in your body. An electrocardiogram test of the heart can also help make the diagnosis. The heart may beat dangerously fast or slow down and stop beating with severe hyperkalemia.  TREATMENT  Treatment depends on how bad the condition is and on the underlying cause.  If the hyperkalemia is an emergency (causing heart problems or paralysis), many different medicines can be used alone or together to lower the potassium level briefly. This may include an insulin injection even if you are not diabetic. Emergency dialysis may be needed to remove potassium from the body.  If the hyperkalemia is less severe or dangerous, the underlying cause is treated. This can include taking medicines if needed. Your prescription medicines may be changed. You may also need to take a medicine to help your body get rid of potassium. You may need to eat a diet low in potassium. HOME CARE INSTRUCTIONS   Take medicines and supplements as directed by your caregiver.  Do not take any over-the-counter  medicines, supplements, natural products, herbs, or vitamins without reviewing them with your caregiver. Certain supplements and natural food products can have high amounts of potassium. Other products (such as ibuprofen) can damage weak kidneys and raise your potassium.  You may be asked to do repeat lab tests. Be sure to follow these directions.  If you have kidney disease, you may need to follow a low potassium diet. SEEK MEDICAL CARE IF:   You notice an irregular or very slow heartbeat.  You feel lightheaded.  You develop weakness that is unusual for you. SEEK IMMEDIATE MEDICAL CARE IF:   You have shortness of breath.  You have chest discomfort.  You pass out (faint). MAKE SURE YOU:   Understand these instructions.  Will watch your condition.  Will get help right away if you are not doing well or get worse. Document Released: 03/12/2002 Document Revised: 06/14/2011 Document Reviewed: 06/27/2013 [facility] Patient Information 2015 [place], [place]. This information is not intended to replace advice given to you by your health care provider. Make sure you discuss any questions you have with your  health care provider. ° °

## 2014-05-15 ENCOUNTER — Telehealth: Payer: Self-pay

## 2014-05-15 ENCOUNTER — Encounter: Payer: Self-pay | Admitting: Vascular Surgery

## 2014-05-15 NOTE — Telephone Encounter (Signed)
Spoke with Lurline Idol at [facility] to schedule appt for pt tomorrow 05/16/14 @ 230pm, dpm

## 2014-05-15 NOTE — Telephone Encounter (Signed)
Phone call from the [facility].  Reported pt. has c/o pain and firmness (L) Upper arm AVF site; noted site to be red and swollen.  Stated she was checked for a bruit today, and none noted.  Requested to move pt's appt. to an earlier date.  Advised will call back with an earlier appt.

## 2014-05-16 ENCOUNTER — Ambulatory Visit (INDEPENDENT_AMBULATORY_CARE_PROVIDER_SITE_OTHER): Payer: Self-pay | Admitting: Vascular Surgery

## 2014-05-16 ENCOUNTER — Encounter: Payer: Self-pay | Admitting: Vascular Surgery

## 2014-05-16 VITALS — BP 155/90 | HR 104 | Temp 98.6°F | Resp 14 | Ht 66.5 in | Wt 232.0 lb

## 2014-05-16 DIAGNOSIS — N186 End stage renal disease: Secondary | ICD-10-CM

## 2014-05-16 NOTE — Progress Notes (Signed)
     History of Present Illness  Linda Ellison is a 39 y.o. year old female who was seen as an add-on patient. She underwent right brachiocephalic AV fistula creation by Dr. Oneida Alar on 05/07/14 and reporting pain around the antecubital incision. She denies any fever or drainage from the incision.   She has a left radiocephalic AV fistula that was placed in May 2012 in which she developed mild steal symptoms in the past three months. She did not want to have her fistula ligated because she did not want to have a catheter placed. She stated she was able to tolerate the left hand symptoms in the short term while her new right sided access was maturing.   Physical Examination Filed Vitals:   05/16/14 1446  BP: 155/90  Pulse: 104  Temp: 98.6 F (37 C)  Resp: 14    RUE: Incision is healing well, no erythema, swelling or drainage. Skin feels warm and dry, hand grip is 5/5, sensation in digits is intact, palpable thrill, bruit can be auscultated.   LUE: left radiocephalic AVF with strong palpable thrill. Sensation in digits intact with 5/5 hand grip.   Medical Decision Making  Linda Ellison is a 39 y.o. year old female who presents s/p right brachiocephalic AV fistula creation 05/07/14. She was seen as an add-on today due to pain in the right antecubital incision. The incision is clean and intact with no evidence of infection. The fistula is patent and she has no symptoms of steal syndrome on the right. She was instructed to apply warm compresses to the incision for pain relief as she is unable to tolerate many pain medications.   She has an appointment with Dr. Oneida Alar on 06/13/14 for six week post-operative follow-up and duplex studies.   Virgina Jock, PA-C [facility] Office: [phone]   05/16/2014, 3:37 PM   This patient was seen and examined in conjunction with Dr. Scot Dock.

## 2014-05-22 ENCOUNTER — Ambulatory Visit (INDEPENDENT_AMBULATORY_CARE_PROVIDER_SITE_OTHER): Payer: Self-pay | Admitting: Vascular Surgery

## 2014-05-22 ENCOUNTER — Encounter: Payer: Self-pay | Admitting: Vascular Surgery

## 2014-05-22 ENCOUNTER — Telehealth: Payer: Self-pay

## 2014-05-22 VITALS — BP 182/95 | HR 97 | Resp 18 | Ht 66.5 in | Wt 230.4 lb

## 2014-05-22 DIAGNOSIS — N186 End stage renal disease: Secondary | ICD-10-CM

## 2014-05-22 DIAGNOSIS — Z992 Dependence on renal dialysis: Secondary | ICD-10-CM

## 2014-05-22 NOTE — Progress Notes (Signed)
Patient is 39 year old female who approximately 2 weeks ago had creation of a right brachiocephalic AV fistula. She was noted at dialysis to have decreased flow. She has some pain in the right forearm and mid right upper arm. She denies any steel type symptoms in the right hand.   Physical exam:  Filed Vitals:   05/22/14 1511  BP: 182/95  Pulse: 97  Resp: 18  Height: 5' 6.5" (1.689 m)  Weight: 230 lb 6.1 oz (104.5 kg)    Easily palpable thrill left forearm AV fistula  Right antecubital incision well-healed no audible bruit or palpable thrill no peri-incisional erythema or drainage no hematoma right hand well-perfused  Assessment: Occluded right upper arm AV fistula short term patency  Plan: We will allow the patient's incision to heal up a few more weeks. Then most likely she will undergo a right upper arm AV graft.  Ruta Hinds, MD [facility] Office: [phone] Pager: [phone]

## 2014-05-22 NOTE — Telephone Encounter (Signed)
Phone call from nurse @ [facility].  Reported the pt. has some redness/ swelling right upper arm AVF site.  Reported no bruit or thrill.  Stated the pt. still c/o the site being very tender.  Reported there is no fever or drainage.  Discussed with Dr. Oneida Alar.  Advised to have pt. come to office today for evaluation.  Notified Evelena Peat at the [facility].  She will advise pt. to come to office after HD to be worked in to schedule.

## 2014-06-12 ENCOUNTER — Encounter: Payer: Self-pay | Admitting: Vascular Surgery

## 2014-06-13 ENCOUNTER — Other Ambulatory Visit (HOSPITAL_COMMUNITY): Payer: Medicare Other

## 2014-06-13 ENCOUNTER — Other Ambulatory Visit: Payer: Self-pay

## 2014-06-13 ENCOUNTER — Ambulatory Visit (INDEPENDENT_AMBULATORY_CARE_PROVIDER_SITE_OTHER): Payer: Self-pay | Admitting: Vascular Surgery

## 2014-06-13 ENCOUNTER — Emergency Department (HOSPITAL_COMMUNITY)
Admission: EM | Admit: 2014-06-13 | Discharge: 2014-06-13 | Disposition: A | Discharge status: Home / Self Care | Payer: No Typology Code available for payment source | Attending: Emergency Medicine | Admitting: Emergency Medicine

## 2014-06-13 ENCOUNTER — Encounter (HOSPITAL_COMMUNITY): Payer: Self-pay | Admitting: *Deleted

## 2014-06-13 ENCOUNTER — Encounter: Payer: Self-pay | Admitting: Vascular Surgery

## 2014-06-13 VITALS — BP 129/89 | HR 95 | Resp 16 | Ht 66.0 in | Wt 229.3 lb

## 2014-06-13 DIAGNOSIS — I509 Heart failure, unspecified: Secondary | ICD-10-CM | POA: Insufficient documentation

## 2014-06-13 DIAGNOSIS — Y998 Other external cause status: Secondary | ICD-10-CM | POA: Diagnosis not present

## 2014-06-13 DIAGNOSIS — S199XXA Unspecified injury of neck, initial encounter: Secondary | ICD-10-CM | POA: Insufficient documentation

## 2014-06-13 DIAGNOSIS — Z794 Long term (current) use of insulin: Secondary | ICD-10-CM | POA: Diagnosis not present

## 2014-06-13 DIAGNOSIS — N186 End stage renal disease: Secondary | ICD-10-CM

## 2014-06-13 DIAGNOSIS — Y9289 Other specified places as the place of occurrence of the external cause: Secondary | ICD-10-CM | POA: Insufficient documentation

## 2014-06-13 DIAGNOSIS — E213 Hyperparathyroidism, unspecified: Secondary | ICD-10-CM | POA: Insufficient documentation

## 2014-06-13 DIAGNOSIS — Z8589 Personal history of malignant neoplasm of other organs and systems: Secondary | ICD-10-CM | POA: Insufficient documentation

## 2014-06-13 DIAGNOSIS — E119 Type 2 diabetes mellitus without complications: Secondary | ICD-10-CM | POA: Diagnosis not present

## 2014-06-13 DIAGNOSIS — K219 Gastro-esophageal reflux disease without esophagitis: Secondary | ICD-10-CM | POA: Diagnosis not present

## 2014-06-13 DIAGNOSIS — E785 Hyperlipidemia, unspecified: Secondary | ICD-10-CM | POA: Diagnosis not present

## 2014-06-13 DIAGNOSIS — I120 Hypertensive chronic kidney disease with stage 5 chronic kidney disease or end stage renal disease: Secondary | ICD-10-CM | POA: Insufficient documentation

## 2014-06-13 DIAGNOSIS — Z992 Dependence on renal dialysis: Secondary | ICD-10-CM

## 2014-06-13 DIAGNOSIS — Z7982 Long term (current) use of aspirin: Secondary | ICD-10-CM | POA: Diagnosis not present

## 2014-06-13 DIAGNOSIS — G8929 Other chronic pain: Secondary | ICD-10-CM | POA: Diagnosis not present

## 2014-06-13 DIAGNOSIS — Y9389 Activity, other specified: Secondary | ICD-10-CM | POA: Insufficient documentation

## 2014-06-13 DIAGNOSIS — Z8739 Personal history of other diseases of the musculoskeletal system and connective tissue: Secondary | ICD-10-CM | POA: Insufficient documentation

## 2014-06-13 DIAGNOSIS — Z862 Personal history of diseases of the blood and blood-forming organs and certain disorders involving the immune mechanism: Secondary | ICD-10-CM | POA: Diagnosis not present

## 2014-06-13 DIAGNOSIS — S4992XA Unspecified injury of left shoulder and upper arm, initial encounter: Secondary | ICD-10-CM | POA: Insufficient documentation

## 2014-06-13 DIAGNOSIS — T07XXXA Unspecified multiple injuries, initial encounter: Secondary | ICD-10-CM

## 2014-06-13 DIAGNOSIS — Z9889 Other specified postprocedural states: Secondary | ICD-10-CM | POA: Insufficient documentation

## 2014-06-13 DIAGNOSIS — S3992XA Unspecified injury of lower back, initial encounter: Secondary | ICD-10-CM | POA: Insufficient documentation

## 2014-06-13 DIAGNOSIS — J45909 Unspecified asthma, uncomplicated: Secondary | ICD-10-CM | POA: Insufficient documentation

## 2014-06-13 DIAGNOSIS — I2510 Atherosclerotic heart disease of native coronary artery without angina pectoris: Secondary | ICD-10-CM | POA: Diagnosis not present

## 2014-06-13 MED ORDER — METHOCARBAMOL 500 MG PO TABS: 500.0000 mg | ORAL_TABLET | Freq: Two times a day (BID) | ORAL | Status: DC

## 2014-06-13 MED ORDER — HYDROCODONE-ACETAMINOPHEN 5-325 MG PO TABS: 2.0000 | ORAL_TABLET | ORAL | Status: DC | PRN

## 2014-06-13 MED ORDER — METHOCARBAMOL 500 MG PO TABS
1000.0000 mg | ORAL_TABLET | Freq: Once | ORAL | Status: AC
  Administered 2014-06-13: 1000 mg via ORAL
  Filled 2014-06-13: qty 2

## 2014-06-13 MED ORDER — HYDROCODONE-ACETAMINOPHEN 5-325 MG PO TABS
1.0000 | ORAL_TABLET | Freq: Once | ORAL | Status: AC
  Administered 2014-06-13: 1 via ORAL
  Filled 2014-06-13: qty 1
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
## 2014-06-13 NOTE — Progress Notes (Signed)
Patient is 39 year old female who approximately 4 weeks ago had creation of a right brachiocephalic AV fistula. She had early occlusion of the fistula. She returns for further follow-up today to consider a new access.  She is being considered for a new access due to minor to moderate steal symptoms in the left upper extremity.   Physical exam:     Filed Vitals:   06/13/14 1351  BP: 129/89  Pulse: 95  Resp: 16  Height: 5\' 6"  (1.676 m)  Weight: 229 lb 4.5 oz (104 kg)   Right antecubital incision well-healed no audible bruit or palpable thrill no peri-incisional erythema or drainage no hematoma right hand well-perfused  Left upper extremity radiocephalic AV fistula easily palpable thrill Assessment: Occluded right upper arm AV fistula short term patency  Plan: Right upper arm AV graft 07/02/2014. Risks benefits possible complications and procedure details were explained to the patient today including but not limited to bleeding infection graft thrombosis, ischemic steal. She understands and agrees to proceed.  The plan will be to eventually ligate the left radiocephalic AV fistula once we get a permanent access working.  Ruta Hinds, MD [facility] Office: [phone] Pager: [phone]
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
## 2014-06-13 NOTE — Discharge Instructions (Signed)
Contusion °A contusion is a deep bruise. Contusions are the result of an injury that caused bleeding under the skin. The contusion may turn blue, purple, or yellow. Minor injuries will give you a painless contusion, but more severe contusions may stay painful and swollen for a few weeks.  °CAUSES  °A contusion is usually caused by a blow, trauma, or direct force to an area of the body. °SYMPTOMS  °· Swelling and redness of the injured area. °· Bruising of the injured area. °· Tenderness and soreness of the injured area. °· Pain. °DIAGNOSIS  °The diagnosis can be made by taking a history and physical exam. An X-ray, CT scan, or MRI may be needed to determine if there were any associated injuries, such as fractures. °TREATMENT  °Specific treatment will depend on what area of the body was injured. In general, the best treatment for a contusion is resting, icing, elevating, and applying cold compresses to the injured area. Over-the-counter medicines may also be recommended for pain control. Ask your caregiver what the best treatment is for your contusion. °HOME CARE INSTRUCTIONS  °· Put ice on the injured area. °¨ Put ice in a plastic bag. °¨ Place a towel between your skin and the bag. °¨ Leave the ice on for 15-20 minutes, 3-4 times a day, or as directed by your health care provider. °· Only take over-the-counter or prescription medicines for pain, discomfort, or fever as directed by your caregiver. Your caregiver may recommend avoiding anti-inflammatory medicines (aspirin, ibuprofen, and naproxen) for 48 hours because these medicines may increase bruising. °· Rest the injured area. °· If possible, elevate the injured area to reduce swelling. °SEEK IMMEDIATE MEDICAL CARE IF:  °· You have increased bruising or swelling. °· You have pain that is getting worse. °· Your swelling or pain is not relieved with medicines. °MAKE SURE YOU:  °· Understand these instructions. °· Will watch your condition. °· Will get help right  away if you are not doing well or get worse. °Document Released: 12/30/2004 Document Revised: 03/27/2013 Document Reviewed: 01/25/2011 °ExitCare® Patient Information ©2015 [employer]. This information is not intended to replace advice given to you by your health care provider. Make sure you discuss any questions you have with your health care provider. ° °

## 2014-06-19 DIAGNOSIS — Z0279 Encounter for issue of other medical certificate: Secondary | ICD-10-CM

## 2014-06-28 ENCOUNTER — Encounter (HOSPITAL_COMMUNITY): Payer: Self-pay | Admitting: *Deleted

## 2014-06-28 NOTE — Progress Notes (Signed)
Pt denies SOB, chest pain, and being under the care of a cardiologist. Pt made aware to not take any diabetic medication on the morning of procedure. Pt made aware to stop taking otc vitamins, herbal medications and NSAID's. Pt verbalized understanding of all pre-op instructions.

## 2014-07-01 MED ORDER — CHLORHEXIDINE GLUCONATE CLOTH 2 % EX PADS: 6.0000 | MEDICATED_PAD | Freq: Once | CUTANEOUS | Status: DC

## 2014-07-01 MED ORDER — SODIUM CHLORIDE 0.9 % IV SOLN
INTRAVENOUS | Status: DC
  Administered 2014-07-02 (×2): via INTRAVENOUS

## 2014-07-01 MED ORDER — CEFUROXIME SODIUM 1.5 G IJ SOLR
1.5000 g | INTRAMUSCULAR | Status: AC
  Administered 2014-07-02: 1.5 g via INTRAVENOUS
  Filled 2014-07-01: qty 1.5

## 2014-07-02 ENCOUNTER — Ambulatory Visit (HOSPITAL_COMMUNITY)
Admission: RE | Admit: 2014-07-02 | Discharge: 2014-07-02 | Disposition: A | Discharge status: Home / Self Care | Payer: Medicare Other | Source: Ambulatory Visit | Attending: Vascular Surgery | Admitting: Vascular Surgery

## 2014-07-02 ENCOUNTER — Encounter (HOSPITAL_COMMUNITY)
Admission: RE | Disposition: A | Discharge status: Home / Self Care | Payer: Self-pay | Source: Ambulatory Visit | Attending: Vascular Surgery

## 2014-07-02 ENCOUNTER — Ambulatory Visit (HOSPITAL_COMMUNITY): Payer: Medicare Other | Admitting: Anesthesiology

## 2014-07-02 ENCOUNTER — Encounter (HOSPITAL_COMMUNITY): Payer: Self-pay | Admitting: Certified Registered"

## 2014-07-02 DIAGNOSIS — N186 End stage renal disease: Secondary | ICD-10-CM | POA: Insufficient documentation

## 2014-07-02 DIAGNOSIS — E119 Type 2 diabetes mellitus without complications: Secondary | ICD-10-CM | POA: Insufficient documentation

## 2014-07-02 DIAGNOSIS — I120 Hypertensive chronic kidney disease with stage 5 chronic kidney disease or end stage renal disease: Secondary | ICD-10-CM | POA: Insufficient documentation

## 2014-07-02 DIAGNOSIS — J45909 Unspecified asthma, uncomplicated: Secondary | ICD-10-CM | POA: Diagnosis not present

## 2014-07-02 DIAGNOSIS — I509 Heart failure, unspecified: Secondary | ICD-10-CM | POA: Insufficient documentation

## 2014-07-02 DIAGNOSIS — K219 Gastro-esophageal reflux disease without esophagitis: Secondary | ICD-10-CM | POA: Diagnosis not present

## 2014-07-02 DIAGNOSIS — Z794 Long term (current) use of insulin: Secondary | ICD-10-CM | POA: Insufficient documentation

## 2014-07-02 DIAGNOSIS — N185 Chronic kidney disease, stage 5: Secondary | ICD-10-CM

## 2014-07-02 HISTORY — PX: AV FISTULA PLACEMENT: SHX1204

## 2014-07-02 LAB — POCT I-STAT 4, (NA,K, GLUC, HGB,HCT)
Glucose, Bld: 142 mg/dL — ABNORMAL HIGH (ref 70–99)
HEMATOCRIT: 37 % (ref 36.0–46.0)
Hemoglobin: 12.6 g/dL (ref 12.0–15.0)
Potassium: 4.2 mmol/L (ref 3.5–5.1)
Sodium: 140 mmol/L (ref 135–145)

## 2014-07-02 LAB — GLUCOSE, CAPILLARY: Glucose-Capillary: 149 mg/dL — ABNORMAL HIGH (ref 70–99)

## 2014-07-02 LAB — HCG, SERUM, QUALITATIVE: Preg, Serum: NEGATIVE

## 2014-07-02 SURGERY — INSERTION OF ARTERIOVENOUS (AV) GORE-TEX GRAFT ARM
Anesthesia: General | Site: Arm Upper | Laterality: Right

## 2014-07-02 MED ORDER — HYDROCODONE-ACETAMINOPHEN 5-325 MG PO TABS: 2.0000 | ORAL_TABLET | ORAL | Status: DC | PRN

## 2014-07-02 MED ORDER — GLYCOPYRROLATE 0.2 MG/ML IJ SOLN
INTRAMUSCULAR | Status: AC
  Filled 2014-07-02: qty 2

## 2014-07-02 MED ORDER — FENTANYL CITRATE 0.05 MG/ML IJ SOLN
INTRAMUSCULAR | Status: DC | PRN
  Administered 2014-07-02 (×2): 25 ug via INTRAVENOUS
  Administered 2014-07-02: 50 ug via INTRAVENOUS

## 2014-07-02 MED ORDER — ONDANSETRON HCL 4 MG/2ML IJ SOLN
INTRAMUSCULAR | Status: AC
  Filled 2014-07-02: qty 2

## 2014-07-02 MED ORDER — HYDRALAZINE HCL 20 MG/ML IJ SOLN
INTRAMUSCULAR | Status: AC
  Filled 2014-07-02: qty 1

## 2014-07-02 MED ORDER — LIDOCAINE HCL (CARDIAC) 20 MG/ML IV SOLN
INTRAVENOUS | Status: AC
  Filled 2014-07-02: qty 5

## 2014-07-02 MED ORDER — PROMETHAZINE HCL 25 MG/ML IJ SOLN: 6.2500 mg | INTRAMUSCULAR | Status: DC | PRN

## 2014-07-02 MED ORDER — FENTANYL CITRATE 0.05 MG/ML IJ SOLN
25.0000 ug | INTRAMUSCULAR | Status: DC | PRN
  Administered 2014-07-02 (×3): 50 ug via INTRAVENOUS

## 2014-07-02 MED ORDER — FENTANYL CITRATE 0.05 MG/ML IJ SOLN
INTRAMUSCULAR | Status: AC
  Filled 2014-07-02: qty 5

## 2014-07-02 MED ORDER — OXYCODONE HCL 5 MG PO TABS: 5.0000 mg | ORAL_TABLET | Freq: Four times a day (QID) | ORAL | Status: DC | PRN

## 2014-07-02 MED ORDER — 0.9 % SODIUM CHLORIDE (POUR BTL) OPTIME
TOPICAL | Status: DC | PRN
  Administered 2014-07-02: 1000 mL

## 2014-07-02 MED ORDER — PROTAMINE SULFATE 10 MG/ML IV SOLN
INTRAVENOUS | Status: AC
  Filled 2014-07-02: qty 5

## 2014-07-02 MED ORDER — PROPOFOL 10 MG/ML IV BOLUS
INTRAVENOUS | Status: AC
  Filled 2014-07-02: qty 20

## 2014-07-02 MED ORDER — THROMBIN 20000 UNITS EX SOLR
CUTANEOUS | Status: AC
  Filled 2014-07-02: qty 20000

## 2014-07-02 MED ORDER — FENTANYL CITRATE 0.05 MG/ML IJ SOLN
INTRAMUSCULAR | Status: AC
  Filled 2014-07-02: qty 2

## 2014-07-02 MED ORDER — HEPARIN SODIUM (PORCINE) 1000 UNIT/ML IJ SOLN
INTRAMUSCULAR | Status: AC
  Filled 2014-07-02: qty 1

## 2014-07-02 MED ORDER — NEOSTIGMINE METHYLSULFATE 10 MG/10ML IV SOLN
INTRAVENOUS | Status: AC
  Filled 2014-07-02: qty 1

## 2014-07-02 MED ORDER — LACTATED RINGERS IV SOLN: INTRAVENOUS | Status: DC

## 2014-07-02 MED ORDER — PHENYLEPHRINE 40 MCG/ML (10ML) SYRINGE FOR IV PUSH (FOR BLOOD PRESSURE SUPPORT)
PREFILLED_SYRINGE | INTRAVENOUS | Status: AC
  Filled 2014-07-02: qty 10

## 2014-07-02 MED ORDER — PHENYLEPHRINE HCL 10 MG/ML IJ SOLN
INTRAMUSCULAR | Status: DC | PRN
  Administered 2014-07-02: 120 ug via INTRAVENOUS
  Administered 2014-07-02 (×2): 80 ug via INTRAVENOUS

## 2014-07-02 MED ORDER — HEPARIN SODIUM (PORCINE) 1000 UNIT/ML IJ SOLN
INTRAMUSCULAR | Status: DC | PRN
  Administered 2014-07-02: 8000 [IU] via INTRAVENOUS

## 2014-07-02 MED ORDER — PROPOFOL 10 MG/ML IV BOLUS
INTRAVENOUS | Status: DC | PRN
  Administered 2014-07-02: 200 mg via INTRAVENOUS

## 2014-07-02 MED ORDER — SODIUM CHLORIDE 0.9 % IR SOLN
Status: DC | PRN
  Administered 2014-07-02: 500 mL

## 2014-07-02 MED ORDER — LIDOCAINE HCL (CARDIAC) 20 MG/ML IV SOLN
INTRAVENOUS | Status: DC | PRN
  Administered 2014-07-02: 80 mg via INTRAVENOUS

## 2014-07-02 MED ORDER — HYDROCODONE-ACETAMINOPHEN 5-325 MG PO TABS
2.0000 | ORAL_TABLET | Freq: Once | ORAL | Status: AC
  Administered 2014-07-02: 2 via ORAL

## 2014-07-02 MED ORDER — MIDAZOLAM HCL 2 MG/2ML IJ SOLN
INTRAMUSCULAR | Status: AC
  Filled 2014-07-02: qty 2

## 2014-07-02 MED ORDER — HYDROCODONE-ACETAMINOPHEN 5-325 MG PO TABS
ORAL_TABLET | ORAL | Status: AC
  Filled 2014-07-02: qty 2

## 2014-07-02 SURGICAL SUPPLY — 41 items
ADH SKN CLS LQ APL DERMABOND (GAUZE/BANDAGES/DRESSINGS) ×1
ARMBAND PINK RESTRICT EXTREMIT (MISCELLANEOUS) ×2 IMPLANT
CANISTER SUCTION 2500CC (MISCELLANEOUS) ×2 IMPLANT
CANNULA VESSEL 3MM 2 BLNT TIP (CANNULA) ×2 IMPLANT
CLIP TI MEDIUM 6 (CLIP) ×2 IMPLANT
CLIP TI WIDE RED SMALL 6 (CLIP) ×2 IMPLANT
DECANTER SPIKE VIAL GLASS SM (MISCELLANEOUS) ×2 IMPLANT
DERMABOND ADHESIVE PROPEN (GAUZE/BANDAGES/DRESSINGS) ×1
DERMABOND ADVANCED .7 DNX6 (GAUZE/BANDAGES/DRESSINGS) ×1 IMPLANT
ELECT REM PT RETURN 9FT ADLT (ELECTROSURGICAL) ×2
ELECTRODE REM PT RTRN 9FT ADLT (ELECTROSURGICAL) ×1 IMPLANT
GEL ULTRASOUND 20GR AQUASONIC (MISCELLANEOUS) IMPLANT
GLOVE BIO SURGEON STRL SZ 6.5 (GLOVE) ×4 IMPLANT
GLOVE BIO SURGEON STRL SZ7.5 (GLOVE) ×2 IMPLANT
GLOVE BIOGEL PI IND STRL 6.5 (GLOVE) ×2 IMPLANT
GLOVE BIOGEL PI IND STRL 7.0 (GLOVE) ×2 IMPLANT
GLOVE BIOGEL PI IND STRL 8 (GLOVE) ×1 IMPLANT
GLOVE BIOGEL PI INDICATOR 6.5 (GLOVE) ×2
GLOVE BIOGEL PI INDICATOR 7.0 (GLOVE) ×2
GLOVE BIOGEL PI INDICATOR 8 (GLOVE) ×1
GLOVE ECLIPSE 6.5 STRL STRAW (GLOVE) ×2 IMPLANT
GLOVE SURG SS PI 6.5 STRL IVOR (GLOVE) ×2 IMPLANT
GOWN STRL REUS W/ TWL LRG LVL3 (GOWN DISPOSABLE) ×3 IMPLANT
GOWN STRL REUS W/ TWL XL LVL3 (GOWN DISPOSABLE) ×1 IMPLANT
GOWN STRL REUS W/TWL LRG LVL3 (GOWN DISPOSABLE) ×6
GOWN STRL REUS W/TWL XL LVL3 (GOWN DISPOSABLE) ×2
GRAFT GORETEX STRT 4-7X45 (Vascular Products) ×2 IMPLANT
KIT BASIN OR (CUSTOM PROCEDURE TRAY) ×2 IMPLANT
KIT ROOM TURNOVER OR (KITS) ×2 IMPLANT
LIQUID BAND (GAUZE/BANDAGES/DRESSINGS) IMPLANT
LOOP VESSEL MINI RED (MISCELLANEOUS) IMPLANT
NS IRRIG 1000ML POUR BTL (IV SOLUTION) ×2 IMPLANT
PACK CV ACCESS (CUSTOM PROCEDURE TRAY) ×2 IMPLANT
PAD ARMBOARD 7.5X6 YLW CONV (MISCELLANEOUS) ×4 IMPLANT
SPONGE SURGIFOAM ABS GEL 100 (HEMOSTASIS) IMPLANT
SUT PROLENE 6 0 CC (SUTURE) ×8 IMPLANT
SUT VIC AB 3-0 SH 27 (SUTURE) ×8
SUT VIC AB 3-0 SH 27X BRD (SUTURE) ×4 IMPLANT
SUT VICRYL 4-0 PS2 18IN ABS (SUTURE) ×6 IMPLANT
UNDERPAD 30X30 INCONTINENT (UNDERPADS AND DIAPERS) ×2 IMPLANT
WATER STERILE IRR 1000ML POUR (IV SOLUTION) ×2 IMPLANT

## 2014-07-02 NOTE — Anesthesia Procedure Notes (Signed)
Procedure Name: Intubation Date/Time: 07/02/2014 11:23 AM Performed by: Julian Reil Pre-anesthesia Checklist: Patient identified, Emergency Drugs available, Suction available and Patient being monitored Patient Re-evaluated:Patient Re-evaluated prior to inductionOxygen Delivery Method: Circle system utilized Preoxygenation: Pre-oxygenation with 100% oxygen Intubation Type: IV induction Ventilation: Mask ventilation without difficulty LMA: LMA inserted LMA Size: 4.0 Tube type: Oral Number of attempts: 1 Placement Confirmation: positive ETCO2 and breath sounds checked- equal and bilateral Tube secured with: Tape Dental Injury: Teeth and Oropharynx as per pre-operative assessment

## 2014-07-02 NOTE — Op Note (Signed)
Procedure: Right Upper Arm AV graft  Preop: ESRD  Postop: ESRD  Anesthesia: General  Asst: Silva Bandy PA-C, Christa McClellan RN  Findings:4-7 mm PTFE end to side to axillary vein   Procedure Details: The right upper extremity was prepped and draped in usual sterile fashion.  A longitudinal incision was then made near the antecubital crease the right arm.  There were no suitable antecubital veins for outflow.   The incision was carried into the subcutaneous tissues down to level of the brachial artery.  Next the brachial artery was dissected free in the medial portion incision. The artery was  2-3 mm in diameter. The vessel loops were placed proximal and distal to the planned site of arteriotomy. At this point, a longitudinal incision was made in the axilla and carried through the subcutaneous tissues and fascia to expose the axillary vein.  The nerves were protected.  The vein was approximately 4-5 mm in diameter. Next, a subcutaneous tunnel was created connecting the upper arm to the lower arm incision in an arcing configuration over the biceps muscle.  A 4-7 mm PTFE graft was then brought through this subcutaneous tunnel. The patient was given 8000 units of intravenous heparin. After appropriate circulation time, the vessel loops were used to control the artery. A longitudinal opening was made in the right brachial artery.  The 4 mm end of the graft was beveled and sewn end to side to the artery using a 6 0 prolene.  At completion of the anastomosis the artery was forward bled, backbled and thoroughly flushed.  The anastomosis was secured, vessel loops were released and there was palpable pulse in the graft.  The graft was clamped just above the arterial anastomosis with a fistula clamp. The graft was then pulled taut to length at the axillary incision.  The axillary vein was controlled with a fine bulldog clamp in the upper axilla proximally and distally.  A longitudinal opening was made in the  vein.  The graft was sewn to the vein using a running 6 0 prolene end of graft to side of vein.  Just prior to completion of the anastomosis, everything was forward bled, back bled and thoroughly flushed.  The anastomosis was secured and the fistula clamp removed from the proximal graft.  A thrill was immediately palpable in the graft. Everything looked hemostatic then the patient began to have bleeding in the upper arm incision.  A tie had fallen off the back wall of the vein and this required repair with a single 6 0 prolene suture.  After hemostasis was obtained, the subcutaneous tissues were reapproximated using a running 3-0 Vicryl suture. The skin was then closed with a 4 0 Vicryl subcuticular stitch. Dermabond was applied to the skin incisions.  The patient tolerated the procedure well and there were no complications.  Instrument sponge and needle count was correct at the end of the case.  The patient was taken to the recovery room in stable condition. The patient had audible radial and ulnar doppler signals at the end of the case.  Ruta Hinds, MD [facility] Office: [phone] Pager: [phone]

## 2014-07-02 NOTE — Transfer of Care (Signed)
Immediate Anesthesia Transfer of Care Note  Patient: Linda Ellison  Procedure(s) Performed: Procedure(s): INSERTION OF ARTERIOVENOUS (AV) GORE-TEX GRAFT ARM (Right)  Patient Location: PACU  Anesthesia Type:General  Level of Consciousness: awake, alert , oriented and patient cooperative  Airway & Oxygen Therapy: Patient Spontanous Breathing and Patient connected to nasal cannula oxygen  Post-op Assessment: Report given to RN, Post -op Vital signs reviewed and stable and Patient moving all extremities  Post vital signs: Reviewed and stable  Last Vitals:  Filed Vitals:   07/02/14 0909  BP: 119/85  Pulse: 99  Temp: 36.6 C  Resp: 18    Complications: No apparent anesthesia complications

## 2014-07-02 NOTE — Interval H&P Note (Signed)
History and Physical Interval Note:  07/02/2014 7:54 AM  Linda Ellison  has presented today for surgery, with the diagnosis of End Stage Renal Disease N18.6  The various methods of treatment have been discussed with the patient and family. After consideration of risks, benefits and other options for treatment, the patient has consented to  Procedure(s): INSERTION OF ARTERIOVENOUS (AV) GORE-TEX GRAFT ARM (Right) as a surgical intervention .  The patient's history has been reviewed, patient examined, no change in status, stable for surgery.  I have reviewed the patient's chart and labs.  Questions were answered to the patient's satisfaction.     FIELDS,CHARLES E

## 2014-07-02 NOTE — H&P (View-Only) (Signed)
Patient is 39 year old female who approximately 4 weeks ago had creation of a right brachiocephalic AV fistula. She had early occlusion of the fistula. She returns for further follow-up today to consider a new access.  She is being considered for a new access due to minor to moderate steal symptoms in the left upper extremity.   Physical exam:     Filed Vitals:   06/13/14 1351  BP: 129/89  Pulse: 95  Resp: 16  Height: 5\' 6"  (1.676 m)  Weight: 229 lb 4.5 oz (104 kg)   Right antecubital incision well-healed no audible bruit or palpable thrill no peri-incisional erythema or drainage no hematoma right hand well-perfused  Left upper extremity radiocephalic AV fistula easily palpable thrill Assessment: Occluded right upper arm AV fistula short term patency  Plan: Right upper arm AV graft 07/02/2014. Risks benefits possible complications and procedure details were explained to the patient today including but not limited to bleeding infection graft thrombosis, ischemic steal. She understands and agrees to proceed.  The plan will be to eventually ligate the left radiocephalic AV fistula once we get a permanent access working.  Ruta Hinds, MD [facility] Office: [phone] Pager: [phone]

## 2014-07-02 NOTE — Anesthesia Postprocedure Evaluation (Signed)
  Anesthesia Post-op Note  Patient: Linda Ellison  Procedure(s) Performed: Procedure(s) (LRB): INSERTION OF ARTERIOVENOUS (AV) GORE-TEX GRAFT ARM (Right)  Patient Location: PACU  Anesthesia Type: General  Level of Consciousness: awake and alert   Airway and Oxygen Therapy: Patient Spontanous Breathing  Post-op Pain: mild  Post-op Assessment: Post-op Vital signs reviewed, Patient's Cardiovascular Status Stable, Respiratory Function Stable, Patent Airway and No signs of Nausea or vomiting  Last Vitals:  Filed Vitals:   07/02/14 1413  BP:   Pulse:   Temp: 37.1 C  Resp:     Post-op Vital Signs: stable   Complications: No apparent anesthesia complications

## 2014-07-02 NOTE — Anesthesia Preprocedure Evaluation (Addendum)
Anesthesia Evaluation  Patient identified by MRN, date of birth, ID band Patient awake    Reviewed: Allergy & Precautions, NPO status , Patient's Chart, lab work & pertinent test results  Airway Mallampati: II  TM Distance: >3 FB Neck ROM: Full    Dental no notable dental hx.    Pulmonary asthma ,  breath sounds clear to auscultation  Pulmonary exam normal       Cardiovascular hypertension, Pt. on medications +CHF Rhythm:Regular Rate:Normal  History of echocardiogram  Echo 9/14: EF 55-60%, normal wall motion, normal diastolic function, PASP 21     Neuro/Psych negative neurological ROS  negative psych ROS   GI/Hepatic negative GI ROS, Neg liver ROS, GERD-  ,  Endo/Other  diabetes, Insulin Dependent  Renal/GU DialysisRenal disease  negative genitourinary   Musculoskeletal negative musculoskeletal ROS (+)   Abdominal   Peds negative pediatric ROS (+)  Hematology negative hematology ROS (+)   Anesthesia Other Findings   Reproductive/Obstetrics negative OB ROS                            Anesthesia Physical Anesthesia Plan  ASA: III  Anesthesia Plan: General   Post-op Pain Management:    Induction: Intravenous  Airway Management Planned: LMA  Additional Equipment:   Intra-op Plan:   Post-operative Plan: Extubation in OR  Informed Consent: I have reviewed the patients History and Physical, chart, labs and discussed the procedure including the risks, benefits and alternatives for the proposed anesthesia with the patient or authorized representative who has indicated his/her understanding and acceptance.   Dental advisory given  Plan Discussed with: CRNA and Surgeon  Anesthesia Plan Comments:        Anesthesia Quick Evaluation

## 2014-07-02 NOTE — Progress Notes (Signed)
Notified Dr. Oneida Alar where he wanted an IV started. Stated pt. needed a central line. Also stated it was ok for lab to draw blood from the right hand. Notified Dr. Kalman Shan of Dr. Nona Dell request.

## 2014-07-03 ENCOUNTER — Telehealth: Payer: Self-pay | Admitting: Vascular Surgery

## 2014-07-03 ENCOUNTER — Encounter (HOSPITAL_COMMUNITY): Payer: Self-pay | Admitting: Vascular Surgery

## 2014-07-03 NOTE — Telephone Encounter (Signed)
-----   Message from Mena Goes, RN sent at 07/02/2014  1:34 PM EDT ----- Regarding: Schedule   ----- Message -----    From: Alvia Grove, PA-C    Sent: 07/02/2014  12:43 PM      To: [facility] Charge Pool  S/p right AV graft 07/02/14  F/u with Dr. Oneida Alar in 2 weeks.  Thanks Maudie Mercury

## 2014-07-03 NOTE — Telephone Encounter (Signed)
Spoke with patient to schedule appointment, dpm °

## 2014-07-09 ENCOUNTER — Other Ambulatory Visit: Payer: Self-pay | Admitting: Internal Medicine

## 2014-07-10 ENCOUNTER — Telehealth: Payer: Self-pay

## 2014-07-10 NOTE — Telephone Encounter (Signed)
Phone call from Linda Ellison.  Reported pt. has no bruit or thrill @ Right UA AVG site.  Stated the pt. reported she hasn't felt anything since the graft was placed.  Reported there is increased swelling @ site today.  Phone call to pt.  Reported the swelling had completely gone down, and now there is a tender, golf ball-sized swelling at the site.  c/o continued bruising in AVG  Site.  Denies warmth @ site. Appt. made for evaluation 07/11/14 @ 12:30 PM.  Pt. agrees with plan.

## 2014-07-11 ENCOUNTER — Encounter: Payer: Self-pay | Admitting: Vascular Surgery

## 2014-07-11 ENCOUNTER — Other Ambulatory Visit: Payer: Self-pay

## 2014-07-11 ENCOUNTER — Ambulatory Visit (INDEPENDENT_AMBULATORY_CARE_PROVIDER_SITE_OTHER): Payer: Self-pay | Admitting: Vascular Surgery

## 2014-07-11 VITALS — BP 163/107 | HR 106 | Temp 98.4°F | Resp 18 | Ht 67.0 in | Wt 230.0 lb

## 2014-07-11 DIAGNOSIS — N186 End stage renal disease: Secondary | ICD-10-CM

## 2014-07-11 DIAGNOSIS — Z992 Dependence on renal dialysis: Secondary | ICD-10-CM

## 2014-07-11 NOTE — Progress Notes (Signed)
Patient is a 39 year old female who returns for follow-up today. She had placement of a right upper arm AV graft 9 days ago. She was noted to have occlusion of the graft 4 days ago at hemodialysis. She currently is dialyzing via a patent left arm AV fistula. We are trying to get a access: In her right arm due to mild steal symptoms in the left hand. She states she did have some numbness and tingling of digits one and 2 in the right hand. She states this is still slightly persistent. She also complains of some soreness over the graft. She denies any episodes of hypotension.  Physical exam:  Filed Vitals:   07/11/14 1240  BP: 163/107  Pulse: 106  Temp: 98.4 F (36.9 C)  TempSrc: Oral  Resp: 18  Height: 5\' 7"  (1.702 m)  Weight: 230 lb (104.327 kg)  SpO2: 100%    Right upper extremity: Some ecchymosis over the graft, no audible bruit or palpable thrill. 2+ right radial pulse. Some edema diffusely throughout the right upper extremity.  Assessment: Thrombosed AV graft right upper extremity. The patient may have had some mild steal symptoms in this arm. However, it is difficult to tell whether or not this may have been postoperative pain. I believe the best option would be to schedule her for thrombectomy and revision of this graft to see if we can salvage this. This is scheduled for Tuesday, April 12. Risks benefits complications procedure details explained to the patient today including but not limited to bleeding infection recurrent graft thrombosis. She understands and agrees to proceed.  Ruta Hinds, MD [facility] Office: [phone] Pager: [phone]

## 2014-07-15 ENCOUNTER — Encounter (HOSPITAL_COMMUNITY): Payer: Self-pay | Admitting: *Deleted

## 2014-07-15 MED ORDER — DEXTROSE 5 % IV SOLN
1.5000 g | INTRAVENOUS | Status: AC
  Administered 2014-07-16: 1.5 g via INTRAVENOUS
  Filled 2014-07-15: qty 1.5

## 2014-07-15 MED ORDER — SODIUM CHLORIDE 0.9 % IV SOLN: INTRAVENOUS | Status: DC

## 2014-07-16 ENCOUNTER — Ambulatory Visit (HOSPITAL_COMMUNITY)
Admission: RE | Admit: 2014-07-16 | Discharge: 2014-07-16 | Disposition: A | Discharge status: Home / Self Care | Payer: Medicare Other | Source: Ambulatory Visit | Attending: Vascular Surgery | Admitting: Vascular Surgery

## 2014-07-16 ENCOUNTER — Ambulatory Visit (HOSPITAL_COMMUNITY): Payer: Medicare Other | Admitting: Anesthesiology

## 2014-07-16 ENCOUNTER — Encounter (HOSPITAL_COMMUNITY): Payer: Self-pay | Admitting: Anesthesiology

## 2014-07-16 ENCOUNTER — Encounter (HOSPITAL_COMMUNITY)
Admission: RE | Disposition: A | Discharge status: Home / Self Care | Payer: Self-pay | Source: Ambulatory Visit | Attending: Vascular Surgery

## 2014-07-16 DIAGNOSIS — N189 Chronic kidney disease, unspecified: Secondary | ICD-10-CM | POA: Insufficient documentation

## 2014-07-16 DIAGNOSIS — T82868A Thrombosis of vascular prosthetic devices, implants and grafts, initial encounter: Secondary | ICD-10-CM | POA: Diagnosis present

## 2014-07-16 DIAGNOSIS — E119 Type 2 diabetes mellitus without complications: Secondary | ICD-10-CM | POA: Diagnosis not present

## 2014-07-16 DIAGNOSIS — Y832 Surgical operation with anastomosis, bypass or graft as the cause of abnormal reaction of the patient, or of later complication, without mention of misadventure at the time of the procedure: Secondary | ICD-10-CM | POA: Insufficient documentation

## 2014-07-16 DIAGNOSIS — Y92239 Unspecified place in hospital as the place of occurrence of the external cause: Secondary | ICD-10-CM | POA: Diagnosis not present

## 2014-07-16 HISTORY — PX: THROMBECTOMY AND REVISION OF ARTERIOVENTOUS (AV) GORETEX  GRAFT: SHX6120

## 2014-07-16 LAB — POCT I-STAT 4, (NA,K, GLUC, HGB,HCT)
GLUCOSE: 139 mg/dL — AB (ref 70–99)
HCT: 32 % — ABNORMAL LOW (ref 36.0–46.0)
Hemoglobin: 10.9 g/dL — ABNORMAL LOW (ref 12.0–15.0)
POTASSIUM: 4.8 mmol/L (ref 3.5–5.1)
Sodium: 140 mmol/L (ref 135–145)

## 2014-07-16 LAB — GLUCOSE, CAPILLARY
GLUCOSE-CAPILLARY: 134 mg/dL — AB (ref 70–99)
Glucose-Capillary: 103 mg/dL — ABNORMAL HIGH (ref 70–99)

## 2014-07-16 SURGERY — THROMBECTOMY AND REVISION OF ARTERIOVENTOUS (AV) GORETEX  GRAFT
Anesthesia: General | Site: Arm Upper | Laterality: Right

## 2014-07-16 MED ORDER — THROMBIN 20000 UNITS EX SOLR
CUTANEOUS | Status: AC
  Filled 2014-07-16: qty 20000

## 2014-07-16 MED ORDER — SODIUM CHLORIDE 0.9 % IV SOLN
INTRAVENOUS | Status: DC | PRN
  Administered 2014-07-16 (×2): via INTRAVENOUS

## 2014-07-16 MED ORDER — 0.9 % SODIUM CHLORIDE (POUR BTL) OPTIME
TOPICAL | Status: DC | PRN
  Administered 2014-07-16: 1000 mL

## 2014-07-16 MED ORDER — LIDOCAINE HCL (PF) 1 % IJ SOLN
INTRAMUSCULAR | Status: AC
  Filled 2014-07-16: qty 30

## 2014-07-16 MED ORDER — ONDANSETRON HCL 4 MG/2ML IJ SOLN: 4.0000 mg | Freq: Once | INTRAMUSCULAR | Status: AC | PRN

## 2014-07-16 MED ORDER — ONDANSETRON HCL 4 MG/2ML IJ SOLN
INTRAMUSCULAR | Status: DC | PRN
  Administered 2014-07-16: 4 mg via INTRAVENOUS

## 2014-07-16 MED ORDER — SODIUM CHLORIDE 0.9 % IR SOLN
Status: DC | PRN
  Administered 2014-07-16: 14:00:00

## 2014-07-16 MED ORDER — PROPOFOL 10 MG/ML IV BOLUS
INTRAVENOUS | Status: AC
  Filled 2014-07-16: qty 20

## 2014-07-16 MED ORDER — HYDROCODONE-ACETAMINOPHEN 5-325 MG PO TABS: 1.0000 | ORAL_TABLET | Freq: Four times a day (QID) | ORAL | Status: DC | PRN

## 2014-07-16 MED ORDER — LIDOCAINE HCL (CARDIAC) 20 MG/ML IV SOLN
INTRAVENOUS | Status: AC
  Filled 2014-07-16: qty 5

## 2014-07-16 MED ORDER — PROPOFOL 10 MG/ML IV BOLUS
INTRAVENOUS | Status: DC | PRN
  Administered 2014-07-16: 150 mg via INTRAVENOUS

## 2014-07-16 MED ORDER — HYDROMORPHONE HCL 1 MG/ML IJ SOLN
INTRAMUSCULAR | Status: AC
  Administered 2014-07-16: 0.5 mg via INTRAVENOUS
  Filled 2014-07-16: qty 1

## 2014-07-16 MED ORDER — ONDANSETRON HCL 4 MG/2ML IJ SOLN
INTRAMUSCULAR | Status: AC
  Filled 2014-07-16: qty 2

## 2014-07-16 MED ORDER — DEXAMETHASONE SODIUM PHOSPHATE 4 MG/ML IJ SOLN
INTRAMUSCULAR | Status: AC
  Filled 2014-07-16: qty 2

## 2014-07-16 MED ORDER — LIDOCAINE HCL (CARDIAC) 10 MG/ML IV SOLN
INTRAVENOUS | Status: DC | PRN
  Administered 2014-07-16: 80 mg via INTRAVENOUS

## 2014-07-16 MED ORDER — MIDAZOLAM HCL 2 MG/2ML IJ SOLN
INTRAMUSCULAR | Status: AC
  Filled 2014-07-16: qty 2

## 2014-07-16 MED ORDER — FENTANYL CITRATE 0.05 MG/ML IJ SOLN
INTRAMUSCULAR | Status: AC
  Filled 2014-07-16: qty 5

## 2014-07-16 MED ORDER — HEPARIN SODIUM (PORCINE) 1000 UNIT/ML IJ SOLN
INTRAMUSCULAR | Status: DC | PRN
  Administered 2014-07-16: 10 mL via INTRAVENOUS

## 2014-07-16 MED ORDER — MIDAZOLAM HCL 5 MG/5ML IJ SOLN
INTRAMUSCULAR | Status: DC | PRN
  Administered 2014-07-16: 1 mg via INTRAVENOUS

## 2014-07-16 MED ORDER — FENTANYL CITRATE 0.05 MG/ML IJ SOLN
INTRAMUSCULAR | Status: DC | PRN
  Administered 2014-07-16 (×5): 50 ug via INTRAVENOUS

## 2014-07-16 MED ORDER — LIDOCAINE HCL (PF) 1 % IJ SOLN
INTRAMUSCULAR | Status: DC | PRN
  Administered 2014-07-16: 30 mL

## 2014-07-16 MED ORDER — HEPARIN SODIUM (PORCINE) 1000 UNIT/ML IJ SOLN
INTRAMUSCULAR | Status: AC
  Filled 2014-07-16: qty 1

## 2014-07-16 MED ORDER — HYDROMORPHONE HCL 1 MG/ML IJ SOLN
0.2500 mg | INTRAMUSCULAR | Status: DC | PRN
  Administered 2014-07-16 (×4): 0.5 mg via INTRAVENOUS

## 2014-07-16 MED ORDER — DEXAMETHASONE SODIUM PHOSPHATE 4 MG/ML IJ SOLN
INTRAMUSCULAR | Status: DC | PRN
  Administered 2014-07-16: 8 mg via INTRAVENOUS

## 2014-07-16 SURGICAL SUPPLY — 32 items
ARMBAND PINK RESTRICT EXTREMIT (MISCELLANEOUS) ×2 IMPLANT
CANISTER SUCTION 2500CC (MISCELLANEOUS) ×2 IMPLANT
CANNULA VESSEL 3MM 2 BLNT TIP (CANNULA) ×2 IMPLANT
CATH EMB 4FR 80CM (CATHETERS) ×2 IMPLANT
CLIP TI MEDIUM 6 (CLIP) ×2 IMPLANT
CLIP TI WIDE RED SMALL 6 (CLIP) ×2 IMPLANT
DECANTER SPIKE VIAL GLASS SM (MISCELLANEOUS) ×2 IMPLANT
ELECT REM PT RETURN 9FT ADLT (ELECTROSURGICAL) ×2
ELECTRODE REM PT RTRN 9FT ADLT (ELECTROSURGICAL) ×1 IMPLANT
GEL ULTRASOUND 20GR AQUASONIC (MISCELLANEOUS) IMPLANT
GLOVE BIO SURGEON STRL SZ 6.5 (GLOVE) ×4 IMPLANT
GLOVE BIO SURGEON STRL SZ7.5 (GLOVE) ×2 IMPLANT
GLOVE BIOGEL PI IND STRL 6.5 (GLOVE) ×2 IMPLANT
GLOVE BIOGEL PI INDICATOR 6.5 (GLOVE) ×2
GLOVE SURG SS PI 6.5 STRL IVOR (GLOVE) ×2 IMPLANT
GOWN STRL REUS W/ TWL LRG LVL3 (GOWN DISPOSABLE) ×3 IMPLANT
GOWN STRL REUS W/TWL LRG LVL3 (GOWN DISPOSABLE) ×6
KIT BASIN OR (CUSTOM PROCEDURE TRAY) ×2 IMPLANT
KIT ROOM TURNOVER OR (KITS) ×2 IMPLANT
LIQUID BAND (GAUZE/BANDAGES/DRESSINGS) ×2 IMPLANT
LOOP VESSEL MAXI BLUE (MISCELLANEOUS) ×2 IMPLANT
LOOP VESSEL MINI RED (MISCELLANEOUS) IMPLANT
NS IRRIG 1000ML POUR BTL (IV SOLUTION) ×2 IMPLANT
PACK CV ACCESS (CUSTOM PROCEDURE TRAY) ×2 IMPLANT
PAD ARMBOARD 7.5X6 YLW CONV (MISCELLANEOUS) ×4 IMPLANT
SPONGE SURGIFOAM ABS GEL 100 (HEMOSTASIS) IMPLANT
SUT PROLENE 6 0 CC (SUTURE) ×2 IMPLANT
SUT VIC AB 3-0 SH 27 (SUTURE) ×4
SUT VIC AB 3-0 SH 27X BRD (SUTURE) ×2 IMPLANT
SUT VICRYL 4-0 PS2 18IN ABS (SUTURE) ×4 IMPLANT
UNDERPAD 30X30 INCONTINENT (UNDERPADS AND DIAPERS) ×2 IMPLANT
WATER STERILE IRR 1000ML POUR (IV SOLUTION) ×2 IMPLANT

## 2014-07-16 NOTE — Interval H&P Note (Signed)
History and Physical Interval Note:  07/16/2014 6:32 AM  Linda Ellison  has presented today for surgery, with the diagnosis of End Stage Renal Disease N18.6  The various methods of treatment have been discussed with the patient and family. After consideration of risks, benefits and other options for treatment, the patient has consented to  Procedure(s): THROMBECTOMY AND REVISION OF ARTERIOVENTOUS (AV) GORETEX  GRAFT (Right) as a surgical intervention .  The patient's history has been reviewed, patient examined, no change in status, stable for surgery.  I have reviewed the patient's chart and labs.  Questions were answered to the patient's satisfaction.     FIELDS,CHARLES E

## 2014-07-16 NOTE — Transfer of Care (Signed)
Immediate Anesthesia Transfer of Care Note  Patient: Linda Ellison  Procedure(s) Performed: Procedure(s): THROMBECTOMY  Right  arm  ARTERIOVENOUS  GORETEX  GRAFT (Right)  Patient Location: PACU  Anesthesia Type:General  Level of Consciousness: awake, alert , oriented and patient cooperative  Airway & Oxygen Therapy: Patient Spontanous Breathing and Patient connected to nasal cannula oxygen  Post-op Assessment: Report given to RN, Post -op Vital signs reviewed and stable and Patient moving all extremities  Post vital signs: Reviewed and stable  Last Vitals:  Filed Vitals:   07/16/14 1525  BP:   Pulse:   Temp: 36.7 C  Resp:     Complications: No apparent anesthesia complications

## 2014-07-16 NOTE — Op Note (Signed)
Procedure: Thrombectomy and revision of right upper arm AV graft  Preoperative diagnosis: Thrombosed AV graft right arm  Postoperative diagnosis: Same  Anesthesia: General  Assistant: Leontine Locket PA-C  Operative findings: No obvious inflow or outflow narrowing  Operative details: After obtaining informed consent, the patient was taken to the operating room. The patient was placed in supine position on the operating room table. After administering general anesthesia, the patient's entire right upper extremity was prepped and draped in the usual sterile fashion.  A longitudinal incision was made in this location through a pre-existing scar. The incision was carried into the subcutaneous tissues down to level the graft. The graft was dissected free circumferentially. Dissection was carried down to the level of the venous anastomosis. There were dense inflammatory adhesions in this area.  This was a very large axillary vein between 4 and 6 mm in diameter. This was dissected free circumferentially above and below the venous anastomosis as it was end to side. The patient was given 10,000 units of intravenous heparin. A longitudinal graftotomy was made just above the level of the anastomosis. A #4 Fogarty catheter was used to thrombectomize the venous limb of the graft. There was good venous backbleeding and the anastomosis easily accepted 3 4 and 5 coronary dilators.  The arterial limb the graft was then thrombectomized with a #4 Fogarty catheter and some inflow was obtained but I did feel this was sluggish.   This was clamped proximally with a fistula clamp.  At this point an additional incision was made through a preexisting scare near the antecubital crease and carried down to the graft and the adjacent arterial anastomosis.  There were also dense inflammatory adhesions in this area.  The artery was controlled proximal and distal to the anastomosis with vessel loops.  A transverse graftotomy was made  just above the anastomosis.  There was adherent thrombus in the graft and this was removed under direct vision.  There was excellent arterial inflow at this point.  The graftotomy was then repaired with a running 6 0 prolene suture.  Attention was then turned back to the venous limb of the graft and this limb was also repaired with a running 6 0 prolene suture.  Just prior to completion, the anastomosis was forebled backbled and thoroughly flushed. The anastomosis was secured; clamps were released; and there was a palpable thrill in the graft immediately. Hemostasis was obtained with direct pressure. The subcutaneous tissues were reapproximated with running 3-0 Vicryl suture. The skin was closed with a 4 0 Vicryl subcuticular stitch. Dermabond was applied the incision. The patient tolerated the procedure well and there were no complications. Instrument sponge and needle counts were correct at the end of the case. The patient was taken to the recovery room in stable condition.  The graft restored flow to the radial and ulnar arteries by doppler with compression but there was minimal doppler flow in the radial and ulnar arteries with the graft open.  Ruta Hinds, MD [facility] Office: [phone] Pager: [phone]

## 2014-07-16 NOTE — Progress Notes (Signed)
Dr Linna Caprice called and asked if he could draw an I stat when he places the IV in her neck, states yes.

## 2014-07-16 NOTE — Anesthesia Procedure Notes (Signed)
Procedure Name: LMA Insertion Date/Time: 07/16/2014 1:28 PM Performed by: Greggory Stallion, Kaila Devries L Pre-anesthesia Checklist: Patient identified, Emergency Drugs available, Suction available, Timeout performed and Patient being monitored Patient Re-evaluated:Patient Re-evaluated prior to inductionOxygen Delivery Method: Circle system utilized Preoxygenation: Pre-oxygenation with 100% oxygen Intubation Type: IV induction Ventilation: Mask ventilation without difficulty LMA: LMA inserted LMA Size: 4.0 Number of attempts: 1 Placement Confirmation: positive ETCO2 and breath sounds checked- equal and bilateral Tube secured with: Tape Dental Injury: Teeth and Oropharynx as per pre-operative assessment

## 2014-07-16 NOTE — Discharge Instructions (Signed)
° ° °  07/16/2014 Linda Ellison [id number] 1976-04-02  Surgeon(s): Elam Dutch, MD  Procedure(s): THROMBECTOMY  Right  arm  ARTERIOVENOUS  GORETEX  GRAFT  x Do not stick graft for 4 weeks

## 2014-07-16 NOTE — Anesthesia Postprocedure Evaluation (Signed)
Anesthesia Post Note  Patient: Linda Ellison  Procedure(s) Performed: Procedure(s) (LRB): THROMBECTOMY  Right  arm  ARTERIOVENOUS  GORETEX  GRAFT (Right)  Anesthesia type: General  Patient location: PACU  Post pain: Pain level controlled  Post assessment: Post-op Vital signs reviewed  Last Vitals: BP 125/71 mmHg  Pulse 104  Temp(Src) 36.1 C (Oral)  Resp 15  Ht 5\' 7"  (1.702 m)  Wt 230 lb (104.327 kg)  BMI 36.01 kg/m2  SpO2 95%  LMP 06/24/2014  Post vital signs: Reviewed  Level of consciousness: sedated  Complications: No apparent anesthesia complications

## 2014-07-16 NOTE — H&P (View-Only) (Signed)
Patient is a 39 year old female who returns for follow-up today. She had placement of a right upper arm AV graft 9 days ago. She was noted to have occlusion of the graft 4 days ago at hemodialysis. She currently is dialyzing via a patent left arm AV fistula. We are trying to get a access: In her right arm due to mild steal symptoms in the left hand. She states she did have some numbness and tingling of digits one and 2 in the right hand. She states this is still slightly persistent. She also complains of some soreness over the graft. She denies any episodes of hypotension.  Physical exam:  Filed Vitals:   07/11/14 1240  BP: 163/107  Pulse: 106  Temp: 98.4 F (36.9 C)  TempSrc: Oral  Resp: 18  Height: 5\' 7"  (1.702 m)  Weight: 230 lb (104.327 kg)  SpO2: 100%    Right upper extremity: Some ecchymosis over the graft, no audible bruit or palpable thrill. 2+ right radial pulse. Some edema diffusely throughout the right upper extremity.  Assessment: Thrombosed AV graft right upper extremity. The patient may have had some mild steal symptoms in this arm. However, it is difficult to tell whether or not this may have been postoperative pain. I believe the best option would be to schedule her for thrombectomy and revision of this graft to see if we can salvage this. This is scheduled for Tuesday, April 12. Risks benefits complications procedure details explained to the patient today including but not limited to bleeding infection recurrent graft thrombosis. She understands and agrees to proceed.  Ruta Hinds, MD [facility] Office: [phone] Pager: [phone]

## 2014-07-17 ENCOUNTER — Telehealth: Payer: Self-pay | Admitting: Vascular Surgery

## 2014-07-17 NOTE — Telephone Encounter (Signed)
-----   Message from Denman George, RN sent at 07/16/2014  5:32 PM EDT ----- Regarding: needs 2 wk. f/u with [facility]   ----- Message -----    From: Gabriel Earing, PA-C    Sent: 07/16/2014   3:09 PM      To: [facility] Charge Pool  S/p thrombectomy RUA AVGG 07/16/14.  Dr. Oneida Alar wants to see this pt back in 2 weeks.  Thanks, Aldona Bar

## 2014-07-17 NOTE — Telephone Encounter (Signed)
Spoke with Maylani to schedule, dpm

## 2014-07-18 ENCOUNTER — Emergency Department (HOSPITAL_COMMUNITY)
Admission: EM | Admit: 2014-07-18 | Discharge: 2014-07-18 | Disposition: A | Discharge status: Home / Self Care | Payer: Medicare Other | Attending: Emergency Medicine | Admitting: Emergency Medicine

## 2014-07-18 ENCOUNTER — Encounter (HOSPITAL_COMMUNITY): Payer: Self-pay | Admitting: *Deleted

## 2014-07-18 ENCOUNTER — Encounter: Payer: Medicare Other | Admitting: Vascular Surgery

## 2014-07-18 ENCOUNTER — Telehealth: Payer: Self-pay

## 2014-07-18 DIAGNOSIS — I509 Heart failure, unspecified: Secondary | ICD-10-CM | POA: Diagnosis not present

## 2014-07-18 DIAGNOSIS — Z992 Dependence on renal dialysis: Secondary | ICD-10-CM | POA: Diagnosis not present

## 2014-07-18 DIAGNOSIS — G629 Polyneuropathy, unspecified: Secondary | ICD-10-CM | POA: Insufficient documentation

## 2014-07-18 DIAGNOSIS — Z79899 Other long term (current) drug therapy: Secondary | ICD-10-CM | POA: Insufficient documentation

## 2014-07-18 DIAGNOSIS — J45909 Unspecified asthma, uncomplicated: Secondary | ICD-10-CM | POA: Insufficient documentation

## 2014-07-18 DIAGNOSIS — E119 Type 2 diabetes mellitus without complications: Secondary | ICD-10-CM | POA: Diagnosis not present

## 2014-07-18 DIAGNOSIS — I120 Hypertensive chronic kidney disease with stage 5 chronic kidney disease or end stage renal disease: Secondary | ICD-10-CM | POA: Diagnosis not present

## 2014-07-18 DIAGNOSIS — Z862 Personal history of diseases of the blood and blood-forming organs and certain disorders involving the immune mechanism: Secondary | ICD-10-CM | POA: Insufficient documentation

## 2014-07-18 DIAGNOSIS — Z7982 Long term (current) use of aspirin: Secondary | ICD-10-CM | POA: Diagnosis not present

## 2014-07-18 DIAGNOSIS — Z794 Long term (current) use of insulin: Secondary | ICD-10-CM | POA: Diagnosis not present

## 2014-07-18 DIAGNOSIS — E785 Hyperlipidemia, unspecified: Secondary | ICD-10-CM | POA: Diagnosis not present

## 2014-07-18 DIAGNOSIS — I2510 Atherosclerotic heart disease of native coronary artery without angina pectoris: Secondary | ICD-10-CM | POA: Insufficient documentation

## 2014-07-18 DIAGNOSIS — N186 End stage renal disease: Secondary | ICD-10-CM | POA: Diagnosis not present

## 2014-07-18 DIAGNOSIS — G8929 Other chronic pain: Secondary | ICD-10-CM | POA: Insufficient documentation

## 2014-07-18 DIAGNOSIS — Z86018 Personal history of other benign neoplasm: Secondary | ICD-10-CM | POA: Insufficient documentation

## 2014-07-18 DIAGNOSIS — Y841 Kidney dialysis as the cause of abnormal reaction of the patient, or of later complication, without mention of misadventure at the time of the procedure: Secondary | ICD-10-CM | POA: Insufficient documentation

## 2014-07-18 DIAGNOSIS — T85691A Other mechanical complication of intraperitoneal dialysis catheter, initial encounter: Secondary | ICD-10-CM | POA: Diagnosis not present

## 2014-07-18 DIAGNOSIS — T829XXA Unspecified complication of cardiac and vascular prosthetic device, implant and graft, initial encounter: Secondary | ICD-10-CM

## 2014-07-18 DIAGNOSIS — K219 Gastro-esophageal reflux disease without esophagitis: Secondary | ICD-10-CM | POA: Insufficient documentation

## 2014-07-18 LAB — CBC WITH DIFFERENTIAL/PLATELET
Basophils Absolute: 0 10*3/uL (ref 0.0–0.1)
Basophils Relative: 1 % (ref 0–1)
EOS PCT: 1 % (ref 0–5)
Eosinophils Absolute: 0.1 10*3/uL (ref 0.0–0.7)
HCT: 27.1 % — ABNORMAL LOW (ref 36.0–46.0)
Hemoglobin: 8.7 g/dL — ABNORMAL LOW (ref 12.0–15.0)
LYMPHS ABS: 2.1 10*3/uL (ref 0.7–4.0)
LYMPHS PCT: 26 % (ref 12–46)
MCH: 27.8 pg (ref 26.0–34.0)
MCHC: 32.1 g/dL (ref 30.0–36.0)
MCV: 86.6 fL (ref 78.0–100.0)
MONOS PCT: 9 % (ref 3–12)
Monocytes Absolute: 0.7 10*3/uL (ref 0.1–1.0)
Neutro Abs: 5.1 10*3/uL (ref 1.7–7.7)
Neutrophils Relative %: 63 % (ref 43–77)
Platelets: 203 10*3/uL (ref 150–400)
RBC: 3.13 MIL/uL — AB (ref 3.87–5.11)
RDW: 15.5 % (ref 11.5–15.5)
WBC: 7.9 10*3/uL (ref 4.0–10.5)

## 2014-07-18 LAB — I-STAT CHEM 8, ED
BUN: 45 mg/dL — ABNORMAL HIGH (ref 6–23)
CHLORIDE: 103 mmol/L (ref 96–112)
Calcium, Ion: 1.1 mmol/L — ABNORMAL LOW (ref 1.12–1.23)
Creatinine, Ser: 7.2 mg/dL — ABNORMAL HIGH (ref 0.50–1.10)
Glucose, Bld: 202 mg/dL — ABNORMAL HIGH (ref 70–99)
HEMATOCRIT: 29 % — AB (ref 36.0–46.0)
Hemoglobin: 9.9 g/dL — ABNORMAL LOW (ref 12.0–15.0)
POTASSIUM: 4.8 mmol/L (ref 3.5–5.1)
SODIUM: 138 mmol/L (ref 135–145)
TCO2: 20 mmol/L (ref 0–100)

## 2014-07-18 LAB — POC OCCULT BLOOD, ED: FECAL OCCULT BLD: NEGATIVE

## 2014-07-18 MED ORDER — MORPHINE SULFATE 4 MG/ML IJ SOLN
4.0000 mg | Freq: Once | INTRAMUSCULAR | Status: AC
  Administered 2014-07-18: 4 mg via INTRAMUSCULAR
  Filled 2014-07-18: qty 1

## 2014-07-18 MED ORDER — HYDROCODONE-ACETAMINOPHEN 5-325 MG PO TABS: ORAL_TABLET | ORAL | Status: DC

## 2014-07-18 MED ORDER — ONDANSETRON 4 MG PO TBDP
4.0000 mg | ORAL_TABLET | Freq: Once | ORAL | Status: AC
  Administered 2014-07-18: 4 mg via ORAL
  Filled 2014-07-18: qty 1

## 2014-07-18 MED ORDER — MORPHINE SULFATE 4 MG/ML IJ SOLN: 4.0000 mg | Freq: Once | INTRAMUSCULAR | Status: DC

## 2014-07-18 MED ORDER — ONDANSETRON HCL 4 MG/2ML IJ SOLN: 4.0000 mg | Freq: Once | INTRAMUSCULAR | Status: DC

## 2014-07-18 MED ORDER — ONDANSETRON HCL 4 MG PO TABS: 4.0000 mg | ORAL_TABLET | Freq: Three times a day (TID) | ORAL | Status: DC | PRN

## 2014-07-18 NOTE — Telephone Encounter (Signed)
Pt. called to report bleeding at right arm surgical site.  Reported she noticed bright red bleeding coming through the original dressing last evening.  Stated she completely changed the dressing; reported she could see active oozing of blood from surgical incision.  Reported she has changed the dressing x 2 this morning due to continued bleeding.  Advised to apply heavy gauze bandage and apply steady pressure at the site.  Reported pt's bleeding to Dr. Oneida Alar, in office.  Recommended to have pt. report to the ER.  Called pt. and advised to have someone drive her to the ER.  Recommended to continue to apply direct steady pressure over site, and to elevate the (R) arm on pillows during the transit to the ER.  Verb. Understanding.

## 2014-07-18 NOTE — ED Notes (Signed)
Pt had dialysis graft to R upper arm revised on Tues.  Begin noticing bleeding yesterday and today cannot feel a pulse.  No pulse noted to graft in triage.

## 2014-07-18 NOTE — Discharge Instructions (Signed)
Take vicodin for breakthrough pain, do not drink alcohol, drive, care for children or do other critical tasks while taking vicodin.   Please follow with your primary care doctor in the next 2 days for a check-up. They must obtain records for further management.   Do not hesitate to return to the Emergency Department for any new, worsening or concerning symptoms.     Dialysis Vascular Access Malfunction A vascular access is an entrance to your blood vessels that can be used for dialysis. A vascular access can be made in one of several ways:   Joining an artery to a vein under your skin to make a bigger blood vessel called a fistula.   Joining an artery to a vein under your skin using a soft tube called a graft.   Placing a thin, flexible tube (catheter) in a large vein, usually in your neck.  A vascular access may malfunction or become blocked.  WHAT CAN CAUSE YOUR VASCULAR ACCESS TO MALFUNCTION?  Infection (common).   A blood clot inside a part of the fistula, graft, or catheter. A blood clot can completely or partially block the flow of blood.   A kink in the graft or catheter.   A collection of blood (called a hematoma or bruise) next to the graft or catheter that pushes against it, blocking the flow of blood.  WHAT ARE SIGNS AND SYMPTOMS OF VASCULAR ACCESS MALFUNCTION?  There is a change in the vibration or pulse of your fistula or graft.  The vibration or pulse of your fistula or graft is gone.   There is new or unusual swelling of the area around the access.   There was an unsuccessful puncture of your access by the dialysis team.   The flow of blood through the fistula, graft, or catheter is too slow for effective dialysis.   When routine dialysis is completed and the needle is removed, bleeding lasts for too long a time.  WHAT HAPPENS IF MY VASCULAR ACCESS MALFUNCTIONS? Your health care provider may order blood work, cultures, or an X-ray test in order to  learn what may be wrong with your vascular access. The X-ray test involves the injection of a liquid into the vascular access. The liquid shows up on the X-ray and allows your health care provider to see if there is a blockage in the vascular access.  Treatment varies depending on the cause of the malfunction:   If the vascular access is infected, your health care provider may prescribe antibiotic medicine to control the infection.   If a clot is found in the vascular access, you may need surgery to remove the clot.   If a blockage in the vascular access is due to some other cause (such as a kink in a graft), then you will likely need surgery to unblock or replace the graft.  HOME CARE INSTRUCTIONS: Follow up with your surgeon or other health care provider if you were instructed to do so. This is very important. Any delay in follow-up could cause permanent dysfunction of the vascular access, which may be dangerous.  SEEK MEDICAL CARE IF:   Fever develops.   Swelling and pain around the vascular access gets worse or new pain develops.  Pain, numbness, or an unusual pale skin color develops in the hand on the side of your vascular access. SEEK IMMEDIATE MEDICAL CARE IF: Unusual bleeding develops at the location of the vascular access. MAKE SURE YOU:  Understand these instructions.  Will watch  your condition.  Will get help right away if you are not doing well or get worse. Document Released: 02/22/2006 Document Revised: 08/06/2013 Document Reviewed: 08/24/2012 [facility] Patient Information 2015 [place], [place]. This information is not intended to replace advice given to you by your health care provider. Make sure you discuss any questions you have with your health care provider.

## 2014-07-18 NOTE — Telephone Encounter (Signed)
Informed Dr. Bridgett Larsson, On Call MD, about pt's symptoms, and that she was instructed to go to the ER.

## 2014-07-18 NOTE — ED Provider Notes (Signed)
CSN: [id number]     Arrival date & time 07/18/14  1229 History   First MD Initiated Contact with Patient 07/18/14 1238     Chief Complaint  Patient presents with  . Follow-up     (Consider location/radiation/quality/duration/timing/severity/associated sxs/prior Treatment) HPI   Linda Ellison is a 39 y.o. female with ESRD advised by vascular surgery and Dr. Oneida Alar to present to the ED for bleeding from thrombectomy site of right AV graft (07/16/2014) describes it is venous oozing starting yesterday. No expanding hematoma, the bruising that she's had is secondary to the surgery, no new ecchymoses. Patient states that the nurse taught her to palpate the thrill, which was present yesterday but on today. She missed her last dialysis yesterday secondary to pain. She states that her pain is becoming not more severe but more constant than initially after the surgery. Denies anticoagulation. States that she has a left-sided fistula but it is not preferred because it has metal.  Past Medical History  Diagnosis Date  . Asthma   . ESRD on hemodialysis     started dialysis 01/29/11  . Weight loss   . Loss of appetite   . CHF (congestive heart failure)   . Hypertension     a. Also with hx of hypotension - has been started on midodrine.  . Hyperlipidemia   . Diabetes mellitus   . Peripheral neuropathy   . Iron deficiency anemia   . Hyperparathyroidism, secondary   . Sciatica   . GERD (gastroesophageal reflux disease)   . Adenoma   . Gastroparesis   . Esophageal erosions     a. Per EGD 10/2011.  Marland Kitchen CAD (coronary artery disease)     a. False positive stress echo 06/2012 at [facility] - cath with mild nonobstructive CAD at [facility] (mild luminal irregularities in LAD, moderate diffuse disease in distal RCA).  . History of echocardiogram     Echo 9/14: EF 55-60%, normal wall motion, normal diastolic function, PASP 21  . Chronic chest pain     a. H/o longstanding atypical CP. Nonobstructive cath 06/2012 and  negative VQ.  Marland Kitchen PONV (postoperative nausea and vomiting)   . ASCVD (arteriosclerotic cardiovascular disease)    Past Surgical History  Procedure Laterality Date  . Cesarean section    . Cholecystectomy  1996  . Av fistula placement  08/2010    Left radiocephalic AVF  . Ligation goretex fistula  01/04/11    Left AVF  . Colonoscopy    . Esophagogastroduodenoscopy  10/19/2011    Dr. Silvano Rusk  . Foot surgery Left   . Tubal ligation    . Left heart catheterization with coronary angiogram N/A 06/27/2012    Procedure: LEFT HEART CATHETERIZATION WITH CORONARY ANGIOGRAM;  Surgeon: Larey Dresser, MD;  Location: [facility] CATH LAB;  Service: Cardiovascular;  Laterality: N/A;  . Fracture surgery Left     left femur  . Dilation and curettage of uterus    . Av fistula placement Right 05/07/2014    Procedure: ARTERIOVENOUS (AV) FISTULA CREATION;  Surgeon: Elam Dutch, MD;  Location: [facility];  Service: Vascular;  Laterality: Right;  . Av fistula placement Right 07/02/2014    Procedure: INSERTION OF ARTERIOVENOUS (AV) GORE-TEX GRAFT ARM;  Surgeon: Elam Dutch, MD;  Location: [facility] OR;  Service: Vascular;  Laterality: Right;  . Thrombectomy and revision of arterioventous (av) goretex  graft Right 07/16/2014    Procedure: THROMBECTOMY  Right  arm  ARTERIOVENOUS  GORETEX  GRAFT;  Surgeon: Elam Dutch, MD;  Location: [facility] OR;  Service: Vascular;  Laterality: Right;   Family History  Problem Relation Age of Onset  . Heart disease Mother     heart attack at 54 y/o-infected valve  . Kidney disease Mother     was a dialysis patient  . Heart disease Brother   . Kidney disease Maternal Grandmother     pre-dialysis  . Kidney failure Paternal Uncle   . Kidney failure Paternal Aunt   . Diabetes Father   . Hypertension Father   . Colon cancer Neg Hx   . Arthritis Brother   . Diabetes Maternal Aunt   . Hypertension Maternal Aunt   . Diabetes Paternal Aunt   . Heart disease Paternal Aunt   .  Hypertension Paternal Aunt    History  Substance Use Topics  . Smoking status: Never Smoker   . Smokeless tobacco: Never Used  . Alcohol Use: No   OB History    No data available     Review of Systems  10 systems reviewed and found to be negative, except as noted in the HPI.   Allergies  Chlorhexidine gluconate; Ciprofloxacin; Fluocinolone; Pineapple; Strawberry; Adhesive; Clindamycin; Clindamycin/lincomycin; and Oxycodone  Home Medications   Prior to Admission medications   Medication Sig Start Date End Date Taking? Authorizing Provider  albuterol (PROVENTIL HFA;VENTOLIN HFA) 108 (90 BASE) MCG/ACT inhaler Inhale 2 puffs into the lungs every 6 (six) hours as needed for wheezing or shortness of breath.   Yes Historical Provider, MD  amLODipine (NORVASC) 10 MG tablet Take 10 mg by mouth daily.  06/13/14  Yes Historical Provider, MD  aspirin 81 MG tablet Take 81 mg by mouth daily.     Yes Historical Provider, MD  Calcium Carb-Cholecalciferol (CALCIUM 600 + D PO) Take 1 tablet by mouth daily.   Yes Historical Provider, MD  gabapentin (NEURONTIN) 100 MG capsule Take 100-200 mg by mouth at bedtime. 100 mg in the am and 200 mg in evening   Yes Historical Provider, MD  hydrOXYzine (ATARAX/VISTARIL) 25 MG tablet Take 25 mg by mouth.   Yes Historical Provider, MD  Insulin Aspart Prot & Aspart (NOVOLOG MIX 70/30 [place]) Inject 35-45 Units into the skin 2 (two) times daily. 45 units every morning and 35 units every evening   Yes Historical Provider, MD  Insulin Detemir (LEVEMIR FLEXTOUCH) 100 UNIT/ML Pen Inject 20 Units into the skin at bedtime. 11/17/13  Yes Historical Provider, MD  labetalol (NORMODYNE) 200 MG tablet Take 200 mg by mouth at bedtime.    Yes Historical Provider, MD  linagliptin (TRADJENTA) 5 MG TABS tablet Take 5 mg by mouth at bedtime.   Yes Historical Provider, MD  methimazole (TAPAZOLE) 10 MG tablet Take 10 mg by mouth at bedtime.  04/20/14  Yes Historical Provider, MD   multivitamin (RENA-VIT) TABS tablet Take 1 tablet by mouth daily.   Yes Historical Provider, MD  omeprazole (PRILOSEC) 20 MG capsule TAKE 1 CAPSULE BY MOUTH EVERY DAY 07/09/14  Yes Gatha Mayer, MD  sevelamer carbonate (RENVELA) 800 MG tablet Take 800-1,600 mg by mouth 3 (three) times daily with meals. 1600 mg with meals and 800 mg with snacks   Yes Historical Provider, MD  simvastatin (ZOCOR) 5 MG tablet Take 5 mg by mouth at bedtime.   Yes Historical Provider, MD  topiramate (TOPAMAX) 50 MG tablet Take 50 mg by mouth daily.    Yes Historical Provider, MD  HYDROcodone-acetaminophen (NORCO/VICODIN) 5-325 MG per  tablet Take 1-2 tablets by mouth every 6 hours as needed for pain and/or cough. 07/18/14   Paetyn Pietrzak, PA-C  ondansetron (ZOFRAN) 4 MG tablet Take 1 tablet (4 mg total) by mouth every 8 (eight) hours as needed for nausea or vomiting. 07/18/14   Elmyra Ricks Jamicia Haaland, PA-C   BP 158/74 mmHg  Pulse 103  Temp(Src) 98.1 F (36.7 C) (Oral)  Resp 18  Ht 5\' 7"  (1.702 m)  Wt 233 lb (105.688 kg)  BMI 36.48 kg/m2  SpO2 100%  LMP 06/23/2014 Physical Exam  Constitutional: She is oriented to person, place, and time. She appears well-developed and well-nourished. No distress.  HENT:  Head: Normocephalic and atraumatic.  Mouth/Throat: Oropharynx is clear and moist.  Eyes: Conjunctivae and EOM are normal. Pupils are equal, round, and reactive to light.  Neck: Normal range of motion.  Cardiovascular: Normal rate, regular rhythm and intact distal pulses.   Right radial pulses 2+. Right AC with scant venous oozing from Dermabonded surgical scar, no hematoma, no significant tenderness to palpation, no thrill palpable.  Pulmonary/Chest: Effort normal and breath sounds normal. No stridor.  Abdominal: Soft. There is no tenderness.  Genitourinary: Guaiac negative stool.  Normal rectal tone, normal stool color. Guaiac is negative.  Musculoskeletal: Normal range of motion.  Neurological: She is alert  and oriented to person, place, and time.  Skin: She is not diaphoretic.  Psychiatric: She has a normal mood and affect.  Nursing note and vitals reviewed.   ED Course  Procedures (including critical care time) Labs Review Labs Reviewed  CBC WITH DIFFERENTIAL/PLATELET - Abnormal; Notable for the following:    RBC 3.13 (*)    Hemoglobin 8.7 (*)    HCT 27.1 (*)    All other components within normal limits  I-STAT CHEM 8, ED - Abnormal; Notable for the following:    BUN 45 (*)    Creatinine, Ser 7.20 (*)    Glucose, Bld 202 (*)    Calcium, Ion 1.10 (*)    Hemoglobin 9.9 (*)    HCT 29.0 (*)    All other components within normal limits  OCCULT BLOOD X 1 CARD TO LAB, STOOL  POC OCCULT BLOOD, ED    Imaging Review No results found.   EKG Interpretation None      MDM   Final diagnoses:  Renal dialysis device, implant, or graft complication    Filed Vitals:   07/18/14 1430 07/18/14 1500 07/18/14 1530 07/18/14 1600  BP: 173/87 177/91 157/88 158/74  Pulse: 95 98 98 103  Temp:    98.1 F (36.7 C)  TempSrc:    Oral  Resp:    18  Height:      Weight:      SpO2: 100% 97% 97% 100%    Medications  ondansetron (ZOFRAN-ODT) disintegrating tablet 4 mg (4 mg Oral Given 07/18/14 1410)  morphine 4 MG/ML injection 4 mg (4 mg Intramuscular Given 07/18/14 1410)    Bluford Kaufmann D Njoku is a pleasant 39 y.o. female presenting with scant venous bleeding to right cortex antecubital graft placed 2 days ago. There is scant bleeding, distal pulses are intact, there is no underlying hematoma, patient states that the thrill is not palpable today.  Vascular surgery consult from Dr. Bridgett Larsson appreciated: He will come to evaluate the patient she will need a catheter for hemodialysis. States that from his perspective the patient can be discharged. He said the left upper extremity fistula not preferred secondary to steal syndrome but is usable  for hemodialysis. He thinks that the right-sided graft is  nonviable secondary to decompressing hematoma, states that her anatomy is likely atypical and she will need more mapping. He will set up an appointment with feels.  Will check hemoglobin to make sure she hasn't had too much blood loss. I-STAT hemoglobin is 9.9, this is down a single point from lab 2 days ago. CBC resulted but shows a 2. drop. Digital rectal exam with fecal occult blood completed. I watch the chest performed in the ED minilab, it was negative, there is a delay in crossing over into epic.  Discussed case with attending MD who agrees with plan and stability to d/c to home.   Evaluation does not show pathology that would require ongoing emergent intervention or inpatient treatment. Pt is hemodynamically stable and mentating appropriately. Discussed findings and plan with patient/guardian, who agrees with care plan. All questions answered. Return precautions discussed and outpatient follow up given.   New Prescriptions   HYDROCODONE-ACETAMINOPHEN (NORCO/VICODIN) 5-325 MG PER TABLET    Take 1-2 tablets by mouth every 6 hours as needed for pain and/or cough.   ONDANSETRON (ZOFRAN) 4 MG TABLET    Take 1 tablet (4 mg total) by mouth every 8 (eight) hours as needed for nausea or vomiting.         Monico Blitz, PA-C 07/18/14 [place], PA-C 07/18/14 1601  Ezequiel Essex, MD 07/18/14 [phone]

## 2014-07-18 NOTE — Progress Notes (Signed)
   Daily Progress Note  Assessment/Planning: POD #2 s/p T&R RUA AVG complicated by thrombosis and decompressing hematoma   Keep arm incisions clean and dry  Follow up with Dr. Oneida Alar next Thursday  Subjective   C/o pain and bleeding from R arm  Objective Filed Vitals:   07/18/14 1237 07/18/14 1407  BP: 177/104 161/84  Pulse: 97 103  Temp: 98.1 F (36.7 C)   TempSrc: Oral   Resp:  18  Height: 5\' 7"  (1.702 m)   Weight: 233 lb (105.688 kg)   SpO2: 100% 99%   No intake or output data in the 24 hours ending 07/18/14 1433  VASC  echymotic graft route, axillary incision c/d/i, antecubital incision with some clot, obvious superficial incision separation without any subcutaneous separation, somewhat edematous right arm, no arterial bleeding  Laboratory CBC    Component Value Date/Time   WBC 6.0 05/09/2014 2245   HGB 10.9* 07/16/2014 1310   HCT 32.0* 07/16/2014 1310   PLT 235 05/09/2014 2245    BMET    Component Value Date/Time   NA 140 07/16/2014 1310   K 4.8 07/16/2014 1310   CL 104 05/09/2014 2245   CO2 22 05/09/2014 2245   GLUCOSE 139* 07/16/2014 1310   BUN 40* 05/09/2014 2245   CREATININE 8.51* 05/09/2014 2245   CALCIUM 9.1 05/09/2014 2245   GFRNONAA 5* 05/09/2014 2245   GFRAA 6* 05/09/2014 2245    Adele Barthel, MD [facility]: [phone] Pager: [phone]  07/18/2014, 2:33 PM

## 2014-07-19 NOTE — Anesthesia Preprocedure Evaluation (Signed)

## 2014-07-22 ENCOUNTER — Telehealth: Payer: Self-pay | Admitting: Vascular Surgery

## 2014-07-22 NOTE — Telephone Encounter (Signed)
LM for pt to call- we will need to discuss time for her to come in this week. dpm

## 2014-07-22 NOTE — Telephone Encounter (Signed)
-----   Message from Mena Goes, RN sent at 07/18/2014  3:30 PM EDT ----- Regarding: Schedule   ----- Message -----    From: Conrad Baskin, MD    Sent: 07/18/2014   2:36 PM      To: [address]  Linda Ellison [id number] October 12, 1975  Pt's recent T&R RUA AVG failed again.  Pt would like to see Dr. Oneida Alar this coming week

## 2014-07-23 NOTE — Telephone Encounter (Signed)
SPoke with patient, she is aware that she will be worked in and knows she may have to wait, dpm

## 2014-07-24 ENCOUNTER — Encounter: Payer: Self-pay | Admitting: Vascular Surgery

## 2014-07-25 ENCOUNTER — Encounter: Payer: Self-pay | Admitting: Vascular Surgery

## 2014-07-25 ENCOUNTER — Ambulatory Visit (INDEPENDENT_AMBULATORY_CARE_PROVIDER_SITE_OTHER): Payer: Self-pay | Admitting: Vascular Surgery

## 2014-07-25 VITALS — BP 173/91 | HR 98 | Ht 67.0 in | Wt 231.7 lb

## 2014-07-25 DIAGNOSIS — N186 End stage renal disease: Secondary | ICD-10-CM

## 2014-07-25 DIAGNOSIS — Z992 Dependence on renal dialysis: Secondary | ICD-10-CM

## 2014-07-25 NOTE — Progress Notes (Signed)
Patient is a 39 year old female who returns for postoperative follow-up today. She recently underwent a right brachiocephalic AV fistula several months ago. This failed early. She subsequently underwent a right upper arm graft. This occluded and short-term after placement. Approximately 10 days ago she underwent thrombectomy and revision of this upper arm graft. This also had an early failure. She was also recently seen by my partner Dr. Bridgett Larsson in the emergency room for some postoperative bleeding which resolved. She denies any fever or chills or further drainage from the arm incisions. All of these attempts at a right arm access were done for mild steal symptoms in the left hand in an attempt to try to get her access in another extremity. Fortunately her left radiocephalic fistula continues to function well. Still have some pain in her left hand however. She is currently considering getting a second opinion by Dr. Lucky Cowboy in [place]. She was referred by Dr. Mercy Moore.  Physical exam:  Filed Vitals:   07/25/14 0904  BP: 173/91  Pulse: 98  Height: 5\' 7"  (1.702 m)  Weight: 231 lb 11.2 oz (105.098 kg)  SpO2: 100%    Right upper extremity: No audible bruit or palpable thrill in graft lower arm and axillary arm incisions healing well  Assessment: Multiple early failures right arm AV access. Currently has a functioning access in her left arm but mild steal symptoms.  Plan: The patient wishes to have a second opinion regarding further access options and has been referred by Dr Mercy Moore to Dr. Lucky Cowboy in [place]. I gave her copies of her operative notes today to take with her to this office visit. Her office visit is scheduled for April 26. She will follow-up on an as-needed basis.  Ruta Hinds, MD [facility] Office: [phone] Pager: [phone]

## 2014-08-01 ENCOUNTER — Encounter: Payer: Medicare Other | Admitting: Vascular Surgery

## 2014-08-08 ENCOUNTER — Encounter: Payer: Self-pay | Admitting: *Deleted

## 2014-08-08 ENCOUNTER — Ambulatory Visit
Admission: RE | Admit: 2014-08-08 | Discharge: 2014-08-08 | Disposition: A | Discharge status: Home / Self Care | Payer: Medicare Other | Source: Ambulatory Visit | Attending: Vascular Surgery | Admitting: Vascular Surgery

## 2014-08-08 ENCOUNTER — Encounter
Admission: RE | Disposition: A | Discharge status: Home / Self Care | Payer: Medicare Other | Source: Ambulatory Visit | Attending: Vascular Surgery

## 2014-08-08 DIAGNOSIS — N186 End stage renal disease: Secondary | ICD-10-CM | POA: Diagnosis not present

## 2014-08-08 DIAGNOSIS — Y999 Unspecified external cause status: Secondary | ICD-10-CM | POA: Diagnosis not present

## 2014-08-08 DIAGNOSIS — I509 Heart failure, unspecified: Secondary | ICD-10-CM | POA: Insufficient documentation

## 2014-08-08 DIAGNOSIS — Z7982 Long term (current) use of aspirin: Secondary | ICD-10-CM | POA: Insufficient documentation

## 2014-08-08 DIAGNOSIS — Z992 Dependence on renal dialysis: Secondary | ICD-10-CM | POA: Insufficient documentation

## 2014-08-08 DIAGNOSIS — I2510 Atherosclerotic heart disease of native coronary artery without angina pectoris: Secondary | ICD-10-CM | POA: Diagnosis not present

## 2014-08-08 DIAGNOSIS — Z794 Long term (current) use of insulin: Secondary | ICD-10-CM | POA: Diagnosis not present

## 2014-08-08 DIAGNOSIS — E1122 Type 2 diabetes mellitus with diabetic chronic kidney disease: Secondary | ICD-10-CM | POA: Diagnosis not present

## 2014-08-08 DIAGNOSIS — T82898A Other specified complication of vascular prosthetic devices, implants and grafts, initial encounter: Secondary | ICD-10-CM | POA: Diagnosis present

## 2014-08-08 DIAGNOSIS — K219 Gastro-esophageal reflux disease without esophagitis: Secondary | ICD-10-CM | POA: Diagnosis not present

## 2014-08-08 DIAGNOSIS — Z79899 Other long term (current) drug therapy: Secondary | ICD-10-CM | POA: Insufficient documentation

## 2014-08-08 DIAGNOSIS — T829XXS Unspecified complication of cardiac and vascular prosthetic device, implant and graft, sequela: Secondary | ICD-10-CM

## 2014-08-08 HISTORY — PX: PERIPHERAL VASCULAR CATHETERIZATION: SHX172C

## 2014-08-08 LAB — GLUCOSE, CAPILLARY: Glucose-Capillary: 67 mg/dL — ABNORMAL LOW (ref 70–99)

## 2014-08-08 LAB — POTASSIUM: POTASSIUM: 3.8 mmol/L (ref 3.5–5.1)

## 2014-08-08 SURGERY — UPPER EXTREMITY ANGIOGRAPHY
Anesthesia: Moderate Sedation | Laterality: Left

## 2014-08-08 MED ORDER — ONDANSETRON HCL 4 MG/2ML IJ SOLN: 4.0000 mg | INTRAMUSCULAR | Status: DC | PRN

## 2014-08-08 MED ORDER — IOHEXOL 300 MG/ML  SOLN
INTRAMUSCULAR | Status: DC | PRN
  Administered 2014-08-08: 75 mL via INTRA_ARTERIAL

## 2014-08-08 MED ORDER — HEPARIN (PORCINE) IN NACL 2-0.9 UNIT/ML-% IJ SOLN
INTRAMUSCULAR | Status: AC
  Filled 2014-08-08: qty 1000

## 2014-08-08 MED ORDER — CEFAZOLIN SODIUM 1-5 GM-% IV SOLN
1.0000 g | Freq: Three times a day (TID) | INTRAVENOUS | Status: DC
  Administered 2014-08-08: 1 g via INTRAVENOUS
  Filled 2014-08-08 (×4): qty 50

## 2014-08-08 MED ORDER — SODIUM CHLORIDE 0.9 % IV SOLN
INTRAVENOUS | Status: DC
  Administered 2014-08-08: 08:00:00 via INTRAVENOUS

## 2014-08-08 MED ORDER — MIDAZOLAM HCL 5 MG/ML IJ SOLN
2.0000 mg | Freq: Once | INTRAMUSCULAR | Status: AC
  Administered 2014-08-08: 2 mg via INTRAVENOUS

## 2014-08-08 MED ORDER — FAMOTIDINE 20 MG PO TABS
20.0000 mg | ORAL_TABLET | Freq: Once | ORAL | Status: DC
  Filled 2014-08-08: qty 1

## 2014-08-08 MED ORDER — CEFAZOLIN SODIUM 1-5 GM-% IV SOLN
INTRAVENOUS | Status: AC
  Filled 2014-08-08: qty 50

## 2014-08-08 MED ORDER — MIDAZOLAM HCL 5 MG/5ML IJ SOLN
INTRAMUSCULAR | Status: AC
  Filled 2014-08-08: qty 5

## 2014-08-08 MED ORDER — FENTANYL CITRATE (PF) 100 MCG/2ML IJ SOLN
INTRAMUSCULAR | Status: AC
  Filled 2014-08-08: qty 2

## 2014-08-08 MED ORDER — DEXTROSE 50 % IV SOLN
50.0000 mL | INTRAVENOUS | Status: DC | PRN
  Filled 2014-08-08: qty 50

## 2014-08-08 MED ORDER — HEPARIN SODIUM (PORCINE) 10000 UNIT/ML IJ SOLN
INTRAMUSCULAR | Status: AC
  Filled 2014-08-08: qty 1

## 2014-08-08 MED ORDER — LIDOCAINE-EPINEPHRINE (PF) 1 %-1:200000 IJ SOLN
INTRAMUSCULAR | Status: AC
  Filled 2014-08-08: qty 30

## 2014-08-08 MED ORDER — DIPHENHYDRAMINE HCL 50 MG/ML IJ SOLN
INTRAMUSCULAR | Status: AC
  Filled 2014-08-08: qty 1

## 2014-08-08 MED ORDER — METHYLPREDNISOLONE SODIUM SUCC 125 MG IJ SOLR
125.0000 mg | Freq: Once | INTRAMUSCULAR | Status: AC
  Administered 2014-08-08: 125 mg via INTRAVENOUS

## 2014-08-08 MED ORDER — FENTANYL CITRATE (PF) 100 MCG/2ML IJ SOLN
50.0000 ug | Freq: Once | INTRAMUSCULAR | Status: AC
  Administered 2014-08-08: 50 ug via INTRAVENOUS

## 2014-08-08 MED ORDER — METHYLPREDNISOLONE SODIUM SUCC 125 MG IJ SOLR
INTRAMUSCULAR | Status: AC
  Administered 2014-08-08: 125 mg via INTRAVENOUS
  Filled 2014-08-08: qty 2

## 2014-08-08 MED ORDER — ATROPINE SULFATE 0.1 MG/ML IJ SOLN: 0.5000 mg | INTRAMUSCULAR | Status: DC | PRN

## 2014-08-08 MED ORDER — HEPARIN SODIUM (PORCINE) 1000 UNIT/ML IJ SOLN
INTRAMUSCULAR | Status: AC
  Filled 2014-08-08: qty 1

## 2014-08-08 MED ORDER — HEPARIN SODIUM (PORCINE) 5000 UNIT/ML IJ SOLN
3000.0000 [IU] | Freq: Once | INTRAMUSCULAR | Status: AC
  Administered 2014-08-08: 3000 [IU] via INTRAVENOUS

## 2014-08-08 MED ORDER — HYDROMORPHONE HCL 1 MG/ML IJ SOLN: 1.0000 mg | Freq: Once | INTRAMUSCULAR | Status: DC | PRN

## 2014-08-08 MED ORDER — METHYLPREDNISOLONE SODIUM SUCC 125 MG IJ SOLR: 125.0000 mg | Freq: Once | INTRAMUSCULAR | Status: DC

## 2014-08-08 SURGICAL SUPPLY — 11 items
CATH BEACON 5 .035 100 H1 TIP (CATHETERS) ×3 IMPLANT
CATH PIG 5.0X110 10S (CATHETERS) ×3 IMPLANT
DEVICE STARCLOSE SE CLOSURE (Vascular Products) ×3 IMPLANT
DEVICE TORQUE (MISCELLANEOUS) ×3 IMPLANT
GLIDECATH ANGLED 4FR 120CM (CATHETERS) ×3 IMPLANT
GUIDEWIRE ANGLED .035X260CM (WIRE) ×3 IMPLANT
PACK ANGIOGRAPHY (CUSTOM PROCEDURE TRAY) ×3 IMPLANT
SHEATH BRITE TIP 5FRX11 (SHEATH) ×3 IMPLANT
SYR MEDRAD MARK V 150ML (SYRINGE) ×3 IMPLANT
TUBING CONTRAST HIGH PRESS 72 (TUBING) ×3 IMPLANT
WIRE G 030X145 068400025406 (WIRE) ×3 IMPLANT

## 2014-08-08 NOTE — Discharge Instructions (Signed)

## 2014-08-08 NOTE — Op Note (Signed)
Linda Ellison, KIMBRELL NO.:  [id number]  MEDICAL RECORD NO.:  [id number]  LOCATION:  [facility]                         FACILITY:  [facility]  PHYSICIAN:  Algernon Huxley, MD        DATE OF BIRTH:  02-17-1976  DATE OF PROCEDURE:  08/08/2014 DATE OF DISCHARGE:  08/08/2014                              OPERATIVE REPORT   PREOPERATIVE DIAGNOSIS: 1. End-stage renal disease. 2. Steal syndrome, left arm with patent left radiocephalic     arteriovenous fistula.  POSTOPERATIVE DIAGNOSIS: 1. End-stage renal disease. 2. Steal syndrome, left arm with patent left radiocephalic     arteriovenous fistula.  PROCEDURE PERFORMED: 1. Ultrasound guidance vascular access to right femoral artery. 2. Catheter placement to left radial artery and left ulnar arteries     from right femoral approach. 3. Thoracic aortogram and selective left upper extremity angiogram     including selective images of the radial and ulnar arteries. 4. StarClose closure device right femoral artery.  SURGEON:  Algernon Huxley, MD  ANESTHESIA:  Local with moderate conscious sedation.  BLOOD LOSS:  Minimal.  FLUOROSCOPY TIME:  Approximately 3 minutes.  INDICATION FOR PROCEDURE:  This is a 39 year old female who presented to our office with steal syndrome.  Her left radiocephalic AV fistula is working well, but her hand is numb and painful.  To further evaluate this to determine what options would be possible to treat her steal syndrome, angiogram of the left upper extremity is indicated.  Risks and benefits are discussed.  Informed consent was obtained.  DESCRIPTION OF PROCEDURE:  The patient was brought to the vascular suite.  Groins were shaved and prepped and sterile surgical field was created.  The right femoral head was localized with fluoroscopy and the right femoral artery was then visualized with ultrasound and found to be widely patent.  It was then accessed under direct ultrasound guidance without  difficulty with a Seldinger needle.  A J-wire and 5-French sheath were then placed.  Pigtail catheter was placed into the ascending aorta and a thoracic aortogram was then performed in the LAO projection. This demonstrated normal origins to the great vessels without significant proximal stenoses and a normal configuration of the great vessels.  The patient was given 3000 units of intravenous heparin and a headhunter catheter was used to selectively cannulate the subclavian artery without difficulty.  This was then sequentially advanced to the brachial artery and to the brachial bifurcation.  The radial artery was entered first, but the catheter only went to the very origin of the radial artery, then exchanged for a 120 cm angle Glidecath to evaluate more distally in the radial artery.  After this removed and replaced into the ulnar artery, this was evaluated.  Findings in the left upper extremity showed no significant stenosis in the subclavian, axillary, brachial, radial, or ulnar arteries.  All these arteries were widely patent without significantly stenosis.  She had a normal palmar arch in the hand.  Steal was demonstrated with all of the flow from the radial artery going out the fistula and a significant amount of retrograde flow was seen into the fistula from a selective ulnar artery  injection through the palmar arch and then out the fistula.  At this point, no percutaneous revascularization will be planned, but ligation of the radial artery distal to the fistula should likely correct her steal. The diagnostic catheter was removed.  Oblique arteriogram was performed of the right femoral artery and StarClose closure device deployed in the usual fashion with excellent hemostatic result. The patient tolerated the procedure well and was taken to the recovery room in stable condition.          ______________________________ Algernon Huxley, MD     JSD/MEDQ  D:  08/08/2014  T:   08/08/2014  Job:  [id number]  cc:   Windy Kalata, M.D.

## 2014-08-08 NOTE — H&P (Signed)
[facility] History & Physical Update  The patient was interviewed and re-examined.  The patient's previous History and Physical has been reviewed and is unchanged.  There is no change in the plan of care.  DEW,JASON, MD  08/08/2014, 8:36 AM

## 2014-08-13 ENCOUNTER — Encounter: Payer: Self-pay | Admitting: Vascular Surgery

## 2014-08-14 ENCOUNTER — Encounter: Payer: Self-pay | Admitting: Vascular Surgery

## 2014-08-15 ENCOUNTER — Encounter
Admission: RE | Admit: 2014-08-15 | Discharge: 2014-08-15 | Disposition: A | Discharge status: Home / Self Care | Payer: Medicare Other | Source: Ambulatory Visit | Attending: Vascular Surgery | Admitting: Vascular Surgery

## 2014-08-15 DIAGNOSIS — E780 Pure hypercholesterolemia: Secondary | ICD-10-CM | POA: Diagnosis not present

## 2014-08-15 DIAGNOSIS — T829XXA Unspecified complication of cardiac and vascular prosthetic device, implant and graft, initial encounter: Secondary | ICD-10-CM | POA: Diagnosis not present

## 2014-08-15 DIAGNOSIS — I509 Heart failure, unspecified: Secondary | ICD-10-CM | POA: Insufficient documentation

## 2014-08-15 DIAGNOSIS — E119 Type 2 diabetes mellitus without complications: Secondary | ICD-10-CM | POA: Diagnosis not present

## 2014-08-15 DIAGNOSIS — Z01812 Encounter for preprocedural laboratory examination: Secondary | ICD-10-CM | POA: Insufficient documentation

## 2014-08-15 LAB — BASIC METABOLIC PANEL
Anion gap: 10 (ref 5–15)
BUN: 31 mg/dL — ABNORMAL HIGH (ref 6–20)
CO2: 30 mmol/L (ref 22–32)
Calcium: 9.3 mg/dL (ref 8.9–10.3)
Chloride: 99 mmol/L — ABNORMAL LOW (ref 101–111)
Creatinine, Ser: 4.85 mg/dL — ABNORMAL HIGH (ref 0.44–1.00)
GFR calc Af Amer: 12 mL/min — ABNORMAL LOW (ref >60)
GFR calc non Af Amer: 10 mL/min — ABNORMAL LOW (ref >60)
Glucose, Bld: 195 mg/dL — ABNORMAL HIGH (ref 65–99)
POTASSIUM: 4.3 mmol/L (ref 3.5–5.1)
SODIUM: 139 mmol/L (ref 135–145)

## 2014-08-15 LAB — PROTIME-INR
INR: 1.07
PROTHROMBIN TIME: 14.1 s (ref 11.4–15.0)

## 2014-08-15 LAB — CBC
HEMATOCRIT: 31.6 % — AB (ref 35.0–47.0)
HEMOGLOBIN: 10.2 g/dL — AB (ref 12.0–16.0)
MCH: 27.4 pg (ref 26.0–34.0)
MCHC: 32.3 g/dL (ref 32.0–36.0)
MCV: 84.9 fL (ref 80.0–100.0)
Platelets: 280 10*3/uL (ref 150–440)
RBC: 3.72 MIL/uL — ABNORMAL LOW (ref 3.80–5.20)
RDW: 14.9 % — ABNORMAL HIGH (ref 11.5–14.5)
WBC: 7 10*3/uL (ref 3.6–11.0)

## 2014-08-15 LAB — APTT: aPTT: 29 seconds (ref 24–36)

## 2014-08-15 NOTE — Patient Instructions (Signed)
  Your procedure is scheduled on: Thursday Aug 22 2014 Report to Day Surgery. [facility] To find out your arrival time please call [phone] between 1PM - 3PM on Wednesday Aug 21 2014.  Remember: Instructions that are not followed completely may result in serious medical risk, up to and including death, or upon the discretion of your surgeon and anesthesiologist your surgery may need to be rescheduled.    __x__ 1. Do not eat food or drink liquids after midnight. No gum chewing or hard candies.     __x__ 2. No Alcohol for 24 hours before or after surgery.   ____ 3. Bring all medications with you on the day of surgery if instructed.    __x__ 4. Notify your doctor if there is any change in your medical condition     (cold, fever, infections).     Do not wear jewelry, make-up, hairpins, clips or nail polish.  Do not wear lotions, powders, or perfumes. You may wear deodorant.  Do not shave 48 hours prior to surgery. Men may shave face and neck.  Do not bring valuables to the hospital.    [facility] is not responsible for any belongings or valuables.               Contacts, dentures or bridgework may not be worn into surgery.  Leave your suitcase in the car. After surgery it may be brought to your room.  For patients admitted to the hospital, discharge time is determined by your                treatment team.   Patients discharged the day of surgery will not be allowed to drive home.   Please read over the following fact sheets that you were given:   Surgical Site Infection Prevention   __x__ Take these medicines the morning of surgery with A SIP OF WATER:    1. omeprazole  2.   3.   4.  5.  6.  ____ Fleet Enema (as directed)   __x__ Use CHG Soap as directed Use dial soap in place of chg soap shower night before and morning of procedure  ____ Use inhalers on the day of surgery  ____ Stop metformin 2 days prior to surgery    __x__ Take 1/2 of usual insulin dose the  night before surgery and none on the morning of surgery.   ____ Stop Coumadin/Plavix/aspirin on   ____ Stop Anti-inflammatories on    ____ Stop supplements until after surgery.    ____ Bring C-Pap to the hospital.

## 2014-08-16 LAB — TYPE AND SCREEN
ABO/RH(D): O POS
Antibody Screen: NEGATIVE

## 2014-08-16 LAB — ABO/RH: ABO/RH(D): O POS

## 2014-08-21 ENCOUNTER — Encounter: Payer: Self-pay | Admitting: *Deleted

## 2014-08-21 ENCOUNTER — Ambulatory Visit: Payer: Medicare Other | Admitting: Certified Registered Nurse Anesthetist

## 2014-08-21 ENCOUNTER — Ambulatory Visit
Admission: RE | Admit: 2014-08-21 | Discharge: 2014-08-21 | Disposition: A | Discharge status: Home / Self Care | Payer: Medicare Other | Source: Ambulatory Visit | Attending: Vascular Surgery | Admitting: Vascular Surgery

## 2014-08-21 ENCOUNTER — Encounter
Admission: RE | Disposition: A | Discharge status: Home / Self Care | Payer: Self-pay | Source: Ambulatory Visit | Attending: Vascular Surgery

## 2014-08-21 DIAGNOSIS — Z9102 Food additives allergy status: Secondary | ICD-10-CM | POA: Insufficient documentation

## 2014-08-21 DIAGNOSIS — K219 Gastro-esophageal reflux disease without esophagitis: Secondary | ICD-10-CM | POA: Insufficient documentation

## 2014-08-21 DIAGNOSIS — Z888 Allergy status to other drugs, medicaments and biological substances status: Secondary | ICD-10-CM | POA: Diagnosis not present

## 2014-08-21 DIAGNOSIS — N186 End stage renal disease: Secondary | ICD-10-CM | POA: Insufficient documentation

## 2014-08-21 DIAGNOSIS — Z992 Dependence on renal dialysis: Secondary | ICD-10-CM | POA: Insufficient documentation

## 2014-08-21 DIAGNOSIS — Z91018 Allergy to other foods: Secondary | ICD-10-CM | POA: Insufficient documentation

## 2014-08-21 DIAGNOSIS — E780 Pure hypercholesterolemia: Secondary | ICD-10-CM | POA: Insufficient documentation

## 2014-08-21 DIAGNOSIS — Z823 Family history of stroke: Secondary | ICD-10-CM | POA: Diagnosis not present

## 2014-08-21 DIAGNOSIS — Z8249 Family history of ischemic heart disease and other diseases of the circulatory system: Secondary | ICD-10-CM | POA: Diagnosis not present

## 2014-08-21 DIAGNOSIS — Z8489 Family history of other specified conditions: Secondary | ICD-10-CM | POA: Diagnosis not present

## 2014-08-21 DIAGNOSIS — Z881 Allergy status to other antibiotic agents status: Secondary | ICD-10-CM | POA: Diagnosis not present

## 2014-08-21 DIAGNOSIS — I120 Hypertensive chronic kidney disease with stage 5 chronic kidney disease or end stage renal disease: Secondary | ICD-10-CM | POA: Diagnosis not present

## 2014-08-21 DIAGNOSIS — Z9049 Acquired absence of other specified parts of digestive tract: Secondary | ICD-10-CM | POA: Diagnosis not present

## 2014-08-21 DIAGNOSIS — I2510 Atherosclerotic heart disease of native coronary artery without angina pectoris: Secondary | ICD-10-CM | POA: Diagnosis not present

## 2014-08-21 DIAGNOSIS — Z794 Long term (current) use of insulin: Secondary | ICD-10-CM | POA: Diagnosis not present

## 2014-08-21 DIAGNOSIS — Z79899 Other long term (current) drug therapy: Secondary | ICD-10-CM | POA: Insufficient documentation

## 2014-08-21 DIAGNOSIS — Z833 Family history of diabetes mellitus: Secondary | ICD-10-CM | POA: Diagnosis not present

## 2014-08-21 DIAGNOSIS — Z7982 Long term (current) use of aspirin: Secondary | ICD-10-CM | POA: Diagnosis not present

## 2014-08-21 DIAGNOSIS — T82848A Pain from vascular prosthetic devices, implants and grafts, initial encounter: Secondary | ICD-10-CM | POA: Diagnosis present

## 2014-08-21 DIAGNOSIS — E119 Type 2 diabetes mellitus without complications: Secondary | ICD-10-CM | POA: Insufficient documentation

## 2014-08-21 HISTORY — PX: LIGATION OF ARTERIOVENOUS  FISTULA: SHX5948

## 2014-08-21 LAB — POCT PREGNANCY, URINE: Preg Test, Ur: NEGATIVE

## 2014-08-21 LAB — GLUCOSE, CAPILLARY: Glucose-Capillary: 75 mg/dL (ref 65–99)

## 2014-08-21 SURGERY — LIGATION OF ARTERIOVENOUS  FISTULA
Anesthesia: General | Site: Arm Lower | Laterality: Left | Wound class: Clean

## 2014-08-21 MED ORDER — FENTANYL CITRATE (PF) 100 MCG/2ML IJ SOLN
25.0000 ug | INTRAMUSCULAR | Status: AC | PRN
  Administered 2014-08-21 (×6): 25 ug via INTRAVENOUS

## 2014-08-21 MED ORDER — HYDROCODONE-ACETAMINOPHEN 5-325 MG PO TABS
1.0000 | ORAL_TABLET | Freq: Four times a day (QID) | ORAL | Status: DC | PRN
  Administered 2014-08-21: 1 via ORAL

## 2014-08-21 MED ORDER — SODIUM CHLORIDE 0.9 % IV SOLN
INTRAVENOUS | Status: DC
  Administered 2014-08-21: 10:00:00 via INTRAVENOUS

## 2014-08-21 MED ORDER — LIDOCAINE HCL (CARDIAC) 20 MG/ML IV SOLN
INTRAVENOUS | Status: DC | PRN
  Administered 2014-08-21: 100 mg via INTRAVENOUS

## 2014-08-21 MED ORDER — FENTANYL CITRATE (PF) 100 MCG/2ML IJ SOLN
INTRAMUSCULAR | Status: AC
  Administered 2014-08-21: 25 ug via INTRAVENOUS
  Filled 2014-08-21: qty 2

## 2014-08-21 MED ORDER — PROPOFOL 10 MG/ML IV BOLUS
INTRAVENOUS | Status: DC | PRN
  Administered 2014-08-21: 160 mg via INTRAVENOUS
  Administered 2014-08-21: 20 mg via INTRAVENOUS

## 2014-08-21 MED ORDER — GLYCOPYRROLATE 0.2 MG/ML IJ SOLN
INTRAMUSCULAR | Status: DC | PRN
  Administered 2014-08-21: 0.1 mg via INTRAVENOUS

## 2014-08-21 MED ORDER — FENTANYL CITRATE (PF) 100 MCG/2ML IJ SOLN
INTRAMUSCULAR | Status: AC
  Filled 2014-08-21: qty 2

## 2014-08-21 MED ORDER — HYDROCODONE-ACETAMINOPHEN 5-325 MG PO TABS: 1.0000 | ORAL_TABLET | Freq: Four times a day (QID) | ORAL | Status: DC | PRN

## 2014-08-21 MED ORDER — HYDROCODONE-ACETAMINOPHEN 5-325 MG PO TABS
ORAL_TABLET | ORAL | Status: AC
  Administered 2014-08-21: 1 via ORAL
  Filled 2014-08-21: qty 1

## 2014-08-21 MED ORDER — CEFAZOLIN SODIUM 1-5 GM-% IV SOLN
1.0000 g | Freq: Once | INTRAVENOUS | Status: AC
  Administered 2014-08-21: 1 g via INTRAVENOUS

## 2014-08-21 MED ORDER — ONDANSETRON HCL 4 MG/2ML IJ SOLN
INTRAMUSCULAR | Status: DC | PRN
  Administered 2014-08-21: 4 mg via INTRAVENOUS

## 2014-08-21 MED ORDER — FENTANYL CITRATE (PF) 100 MCG/2ML IJ SOLN
INTRAMUSCULAR | Status: DC | PRN
  Administered 2014-08-21 (×4): 25 ug via INTRAVENOUS

## 2014-08-21 MED ORDER — MIDAZOLAM HCL 2 MG/2ML IJ SOLN
INTRAMUSCULAR | Status: DC | PRN
  Administered 2014-08-21: 2 mg via INTRAVENOUS

## 2014-08-21 MED ORDER — CEFAZOLIN SODIUM 1-5 GM-% IV SOLN
INTRAVENOUS | Status: AC
  Administered 2014-08-21: 1 g via INTRAVENOUS
  Filled 2014-08-21: qty 50

## 2014-08-21 MED ORDER — ONDANSETRON HCL 4 MG/2ML IJ SOLN: 4.0000 mg | Freq: Once | INTRAMUSCULAR | Status: DC | PRN

## 2014-08-21 SURGICAL SUPPLY — 44 items
BAG DECANTER STRL (MISCELLANEOUS) ×3 IMPLANT
BLADE SURG SZ11 CARB STEEL (BLADE) ×3 IMPLANT
BOOT SUTURE AID YELLOW STND (SUTURE) IMPLANT
CANISTER SUCT 1200ML W/VALVE (MISCELLANEOUS) ×3 IMPLANT
CHLORAPREP W/TINT 26ML (MISCELLANEOUS) IMPLANT
CLIP SPRNG 6MM S-JAW DBL (CLIP)
DURAPREP 26ML APPLICATOR (WOUND CARE) ×3 IMPLANT
ELECT CAUTERY BLADE 6.4 (BLADE) ×3 IMPLANT
GLOVE BIO SURGEON STRL SZ7 (GLOVE) ×6 IMPLANT
GOWN STRL REUS W/ TWL LRG LVL3 (GOWN DISPOSABLE) ×1 IMPLANT
GOWN STRL REUS W/ TWL XL LVL3 (GOWN DISPOSABLE) ×1 IMPLANT
GOWN STRL REUS W/TWL LRG LVL3 (GOWN DISPOSABLE) ×3
GOWN STRL REUS W/TWL XL LVL3 (GOWN DISPOSABLE) ×3
HEMOSTAT SURGICEL 2X3 (HEMOSTASIS) ×3 IMPLANT
IV NS 500ML (IV SOLUTION) ×3
IV NS 500ML BAXH (IV SOLUTION) ×1 IMPLANT
LABEL OR SOLS (LABEL) ×3 IMPLANT
LIQUID BAND (GAUZE/BANDAGES/DRESSINGS) ×3 IMPLANT
LOOP RED MAXI  1X406MM (MISCELLANEOUS)
LOOP VESSEL MAXI 1X406 RED (MISCELLANEOUS) IMPLANT
LOOP VESSEL MINI 0.8X406 BLUE (MISCELLANEOUS) IMPLANT
LOOPS BLUE MINI 0.8X406MM (MISCELLANEOUS)
NEEDLE FILTER BLUNT 18X 1/2SAF (NEEDLE)
NEEDLE FILTER BLUNT 18X1 1/2 (NEEDLE) IMPLANT
NS IRRIG 500ML POUR BTL (IV SOLUTION) ×3 IMPLANT
PACK EXTREMITY ARMC (MISCELLANEOUS) ×3 IMPLANT
PAD GROUND ADULT SPLIT (MISCELLANEOUS) ×3 IMPLANT
PAD PREP 24X41 OB/GYN DISP (PERSONAL CARE ITEMS) ×3 IMPLANT
SOLUTION CELL SAVER (CLIP) IMPLANT
STOCKINETTE STRL 4IN 9604848 (GAUZE/BANDAGES/DRESSINGS) ×3 IMPLANT
STRAP SAFETY BODY (MISCELLANEOUS) ×3 IMPLANT
SUT MNCRL AB 4-0 PS2 18 (SUTURE) ×3 IMPLANT
SUT PROLENE 6 0 BV (SUTURE) ×3 IMPLANT
SUT SILK 2 0 (SUTURE) ×3
SUT SILK 2 0 SH (SUTURE) IMPLANT
SUT SILK 2-0 18XBRD TIE 12 (SUTURE) ×1 IMPLANT
SUT SILK 3 0 (SUTURE) ×3
SUT SILK 3-0 18XBRD TIE 12 (SUTURE) ×1 IMPLANT
SUT SILK 4 0 (SUTURE) ×3
SUT SILK 4-0 18XBRD TIE 12 (SUTURE) ×1 IMPLANT
SUT VIC AB 3-0 SH 27 (SUTURE) ×3
SUT VIC AB 3-0 SH 27X BRD (SUTURE) ×1 IMPLANT
SYR 20CC LL (SYRINGE) ×3 IMPLANT
SYR 3ML LL SCALE MARK (SYRINGE) IMPLANT

## 2014-08-21 NOTE — Transfer of Care (Signed)
Immediate Anesthesia Transfer of Care Note  Patient: Linda Ellison  Procedure(s) Performed: Procedure(s): LIGATION OF ARTERIOVENOUS  FISTULA (Left)  Patient Location: PACU  Anesthesia Type:General  Level of Consciousness: sedated  Airway & Oxygen Therapy: Patient Spontanous Breathing and Patient connected to face mask oxygen  Post-op Assessment: Report given to RN and Post -op Vital signs reviewed and stable  Post vital signs: Reviewed and stable  Last Vitals:  Filed Vitals:   08/21/14 1138  BP: 135/89  Pulse:   Temp:   Resp: 16    Complications: No apparent anesthesia complications

## 2014-08-21 NOTE — Discharge Instructions (Signed)
° °  AMBULATORY SURGERY  DISCHARGE INSTRUCTIONS   1) The drugs that you were given will stay in your system until tomorrow so for the next 24 hours you should not:  A) Drive an automobile B) Make any legal decisions C) Drink any alcoholic beverage   2) You may resume regular meals tomorrow.  Today it is better to start with liquids and gradually work up to solid foods.  You may eat anything you prefer, but it is better to start with liquids, then soup and crackers, and gradually work up to solid foods.   3) Please notify your doctor immediately if you have any unusual bleeding, trouble breathing, redness and pain at the surgery site, drainage, fever, or pain not relieved by medication. 4)   5) Your post-operative visit with Dr.    George Ina                                 is: Date:                        Time:    Please call to schedule your post-operative visit.  6) Additional Instructions: 7)

## 2014-08-21 NOTE — Anesthesia Postprocedure Evaluation (Signed)
  Anesthesia Post-op Note  Patient: Linda Ellison  Procedure(s) Performed: Procedure(s): LIGATION OF ARTERIOVENOUS  FISTULA (Left)  Anesthesia type:General  Patient location: PACU  Post pain: Pain level controlled  Post assessment: Post-op Vital signs reviewed, Patient's Cardiovascular Status Stable, Respiratory Function Stable, Patent Airway and No signs of Nausea or vomiting  Post vital signs: Reviewed and stable  Last Vitals:  Filed Vitals:   08/21/14 1138  BP: 135/89  Pulse:   Temp: 36.4 C  Resp: 16    Level of consciousness: awake, alert  and patient cooperative  Complications: No apparent anesthesia complications

## 2014-08-21 NOTE — Op Note (Signed)
[facility]   OPERATIVE NOTE    PRE-OPERATIVE DIAGNOSIS: 1 end-stage renal disease 2 steal syndrome left arm with patent left radiocephalic AV fistula  POST-OPERATIVE DIAGNOSIS: Same  PROCEDURE: 1.   Ligation of left radial artery distal to the fistula  SURGEON: Leotis Pain, MD  ASSISTANT(S): None  ANESTHESIA: general  ESTIMATED BLOOD LOSS: Minimal   FINDING(S): 1.  None  SPECIMEN(S):  None  INDICATIONS:   Linda Ellison is a 39 y.o. female who presents with steal syndrome of the left hand. She has a long-standing left radiocephalic AV fistula which is widely patent. Angiogram showed good flow through the ulnar artery with complete palmar arch. She is brought in for ligation of the radial artery distal to the fistula to reduce her steal symptoms. Risks and benefits were discussed and informed consent was obtained.  DESCRIPTION: After obtaining full informed written consent, the patient was brought back to the operating room and placed supine upon the operating table.  The patient received IV antibiotics prior to induction.  After obtaining adequate anesthesia, the patient was prepped and draped in the standard fashion. A small incision was made just beyond her previous radiocephalic AV fistula incision. The radial artery was then dissected out.  With clamping of the artery her fingers were found to be well-perfused so the artery was doubly ligated with 2-0 silk ties. The wound was then closed with a 3-0 Vicryl and a 4-0 Monocryl. Dermabond was placed as a dressing. Patient was taken to the recovery room in stable condition having tolerated the procedure well.  COMPLICATIONS: None  CONDITION: Stable  Nicolo Tomko  08/21/2014, 11:30 AM

## 2014-08-21 NOTE — Progress Notes (Signed)
Pt c/o left arm pain 9/10 after waking each time.

## 2014-08-21 NOTE — H&P (Signed)
[facility] History & Physical Update  The patient was interviewed and re-examined.  The patient's previous History and Physical has been reviewed and is unchanged.  There is no change in the plan of care.  After angio, steal should improve with ligation of the radial artery distal to the fistula.    Linda Biffle, MD  08/21/2014, 10:18 AM

## 2014-08-21 NOTE — Anesthesia Preprocedure Evaluation (Signed)
Anesthesia Evaluation  Patient identified by MRN, date of birth, ID band Patient awake    Reviewed: Allergy & Precautions, NPO status , Patient's Chart, lab work & pertinent test results  History of Anesthesia Complications (+) PONV  Airway Mallampati: III  TM Distance: >3 FB Neck ROM: Full    Dental   Pulmonary asthma (last inhaler 10 yrs ago) ,          Cardiovascular hypertension, Pt. on medications and Pt. on home beta blockers - angina+ CAD, + Peripheral Vascular Disease and +CHF     Neuro/Psych  Neuromuscular disease (peripheral neuropathy)    GI/Hepatic GERD-  Medicated and Controlled,  Endo/Other  diabetes, Poorly Controlled, Type 2, Oral Hypoglycemic Agents  Renal/GU ESRF and DialysisRenal disease     Musculoskeletal   Abdominal   Peds  Hematology  (+) anemia ,   Anesthesia Other Findings   Reproductive/Obstetrics                             Anesthesia Physical Anesthesia Plan  ASA: IV  Anesthesia Plan: General   Post-op Pain Management:    Induction: Intravenous  Airway Management Planned: LMA  Additional Equipment:   Intra-op Plan:   Post-operative Plan:   Informed Consent:   Plan Discussed with:   Anesthesia Plan Comments:         Anesthesia Quick Evaluation

## 2014-08-22 ENCOUNTER — Encounter (HOSPITAL_BASED_OUTPATIENT_CLINIC_OR_DEPARTMENT_OTHER): Payer: Self-pay | Admitting: *Deleted

## 2014-08-22 ENCOUNTER — Emergency Department (HOSPITAL_BASED_OUTPATIENT_CLINIC_OR_DEPARTMENT_OTHER)
Admission: EM | Admit: 2014-08-22 | Discharge: 2014-08-22 | Disposition: A | Discharge status: Home / Self Care | Payer: Medicare Other | Attending: Emergency Medicine | Admitting: Emergency Medicine

## 2014-08-22 DIAGNOSIS — Z79899 Other long term (current) drug therapy: Secondary | ICD-10-CM | POA: Diagnosis not present

## 2014-08-22 DIAGNOSIS — Z992 Dependence on renal dialysis: Secondary | ICD-10-CM | POA: Insufficient documentation

## 2014-08-22 DIAGNOSIS — J45909 Unspecified asthma, uncomplicated: Secondary | ICD-10-CM | POA: Diagnosis not present

## 2014-08-22 DIAGNOSIS — J392 Other diseases of pharynx: Secondary | ICD-10-CM | POA: Insufficient documentation

## 2014-08-22 DIAGNOSIS — Z8669 Personal history of other diseases of the nervous system and sense organs: Secondary | ICD-10-CM | POA: Diagnosis not present

## 2014-08-22 DIAGNOSIS — Z7982 Long term (current) use of aspirin: Secondary | ICD-10-CM | POA: Insufficient documentation

## 2014-08-22 DIAGNOSIS — I2510 Atherosclerotic heart disease of native coronary artery without angina pectoris: Secondary | ICD-10-CM | POA: Insufficient documentation

## 2014-08-22 DIAGNOSIS — N186 End stage renal disease: Secondary | ICD-10-CM | POA: Insufficient documentation

## 2014-08-22 DIAGNOSIS — Z794 Long term (current) use of insulin: Secondary | ICD-10-CM | POA: Diagnosis not present

## 2014-08-22 DIAGNOSIS — G8929 Other chronic pain: Secondary | ICD-10-CM | POA: Diagnosis not present

## 2014-08-22 DIAGNOSIS — R1110 Vomiting, unspecified: Secondary | ICD-10-CM | POA: Diagnosis not present

## 2014-08-22 DIAGNOSIS — E119 Type 2 diabetes mellitus without complications: Secondary | ICD-10-CM | POA: Diagnosis not present

## 2014-08-22 DIAGNOSIS — I509 Heart failure, unspecified: Secondary | ICD-10-CM | POA: Diagnosis not present

## 2014-08-22 DIAGNOSIS — I120 Hypertensive chronic kidney disease with stage 5 chronic kidney disease or end stage renal disease: Secondary | ICD-10-CM | POA: Insufficient documentation

## 2014-08-22 MED ORDER — GI COCKTAIL ~~LOC~~
30.0000 mL | Freq: Once | ORAL | Status: AC
  Administered 2014-08-22: 30 mL via ORAL
  Filled 2014-08-22: qty 30

## 2014-08-22 NOTE — ED Notes (Signed)
C/o coughing up blood that started at 1730 yesterday. Was discharged yest from [place]. Pt states she coughs up a clump of red mucus and and she is blowing her nose and it is red. This did not start until after OP surg yest. Also c/o sore throat and dry mouth.

## 2014-08-22 NOTE — ED Provider Notes (Signed)
CSN: [id number]     Arrival date & time 08/22/14  [id number] History   First MD Initiated Contact with Patient 08/22/14 [phone]     No chief complaint on file.    (Consider location/radiation/quality/duration/timing/severity/associated sxs/prior Treatment) HPI Comments: Here with concern for hematemesis and hemoptysis. Patient was coughing this morning and had some mucus mixed with blood. Afterwards she was brushing her teeth and vomited. He was mostly mucus with a few small amounts of blood. She denies any shortness of breath, fever, chest pain. She does have some throat soreness. Of note yesterday she had a procedure involving her left arm fistula for dialysis. She had an LMA placed per notes from anesthesia. She's having some throat soreness and pain with swallowing. She has no history of esophageal varices,-ulcers. She is only on aspirin and not on any other blood thinners.  Patient is a 39 y.o. female presenting with general illness. The history is provided by the patient.  Illness Location:  Throat Quality:  Soreness, coughing up mucus with blood streaks, vomited mucus with blood streaks Severity:  Mild Onset quality:  Gradual Timing:  Intermittent Progression:  Unchanged Chronicity:  New Context:  LMA placed yesterday for L radial artery ligation to resolve her L arm steal syndrome. Relieved by:  Nothing Worsened by:  Nothing Associated symptoms: vomiting (mucus with blood streaks)   Associated symptoms: no diarrhea, no fever, no nausea and no shortness of breath  Cough: productive of mucus mixed with blood.     Past Medical History  Diagnosis Date  . Asthma   . ESRD on hemodialysis     started dialysis 01/29/11  . Weight loss   . Loss of appetite   . CHF (congestive heart failure)   . Hypertension     a. Also with hx of hypotension - has been started on midodrine.  . Hyperlipidemia   . Diabetes mellitus   . Peripheral neuropathy   . Iron deficiency anemia   .  Hyperparathyroidism, secondary   . Sciatica   . GERD (gastroesophageal reflux disease)   . Adenoma   . Gastroparesis   . Esophageal erosions     a. Per EGD 10/2011.  Marland Kitchen CAD (coronary artery disease)     a. False positive stress echo 06/2012 at [facility] - cath with mild nonobstructive CAD at [facility] (mild luminal irregularities in LAD, moderate diffuse disease in distal RCA).  . History of echocardiogram     Echo 9/14: EF 55-60%, normal wall motion, normal diastolic function, PASP 21  . Chronic chest pain     a. H/o longstanding atypical CP. Nonobstructive cath 06/2012 and negative VQ.  Marland Kitchen ASCVD (arteriosclerotic cardiovascular disease)   . Anginal pain   . Peripheral vascular disease   . PONV (postoperative nausea and vomiting)    Past Surgical History  Procedure Laterality Date  . Cesarean section    . Cholecystectomy  1996  . Av fistula placement  08/2010    Left radiocephalic AVF  . Ligation goretex fistula  01/04/11    Left AVF  . Colonoscopy    . Esophagogastroduodenoscopy  10/19/2011    Dr. Silvano Rusk  . Foot surgery Left   . Tubal ligation    . Left heart catheterization with coronary angiogram N/A 06/27/2012    Procedure: LEFT HEART CATHETERIZATION WITH CORONARY ANGIOGRAM;  Surgeon: Larey Dresser, MD;  Location: [facility] CATH LAB;  Service: Cardiovascular;  Laterality: N/A;  . Fracture surgery Left     left femur  .  Dilation and curettage of uterus    . Av fistula placement Right 05/07/2014    Procedure: ARTERIOVENOUS (AV) FISTULA CREATION;  Surgeon: Elam Dutch, MD;  Location: [place];  Service: Vascular;  Laterality: Right;  . Av fistula placement Right 07/02/2014    Procedure: INSERTION OF ARTERIOVENOUS (AV) GORE-TEX GRAFT ARM;  Surgeon: Elam Dutch, MD;  Location: [facility] OR;  Service: Vascular;  Laterality: Right;  . Thrombectomy and revision of arterioventous (av) goretex  graft Right 07/16/2014    Procedure: THROMBECTOMY  Right  arm  ARTERIOVENOUS  GORETEX  GRAFT;  Surgeon:  Elam Dutch, MD;  Location: [place];  Service: Vascular;  Laterality: Right;  . Peripheral vascular catheterization Left 08/08/2014    Procedure: Upper Extremity Angiography;  Surgeon: Algernon Huxley, MD;  Location: [facility] CV LAB;  Service: Cardiovascular;  Laterality: Left;  . Peripheral vascular catheterization Left 08/08/2014    Procedure: Upper Extremity Intervention;  Surgeon: Algernon Huxley, MD;  Location: [place] [facility] LAB;  Service: Cardiovascular;  Laterality: Left;   Family History  Problem Relation Age of Onset  . Heart disease Mother     heart attack at 41 y/o-infected valve  . Kidney disease Mother     was a dialysis patient  . Heart disease Brother   . Kidney disease Maternal Grandmother     pre-dialysis  . Kidney failure Paternal Uncle   . Kidney failure Paternal Aunt   . Diabetes Father   . Hypertension Father   . Colon cancer Neg Hx   . Arthritis Brother   . Diabetes Maternal Aunt   . Hypertension Maternal Aunt   . Diabetes Paternal Aunt   . Heart disease Paternal Aunt   . Hypertension Paternal Aunt    History  Substance Use Topics  . Smoking status: Never Smoker   . Smokeless tobacco: Never Used  . Alcohol Use: No   OB History    No data available     Review of Systems  Constitutional: Negative for fever.  Respiratory: Negative for shortness of breath. Cough: productive of mucus mixed with blood.   Gastrointestinal: Positive for vomiting (mucus with blood streaks). Negative for nausea and diarrhea.  All other systems reviewed and are negative.     Allergies  Chlorhexidine gluconate; Ciprofloxacin; Fluocinolone; Pineapple; Strawberry; Adhesive; Clindamycin; Clindamycin/lincomycin; Oxycodone; and Shrimp  Home Medications   Prior to Admission medications   Medication Sig Start Date End Date Taking? Authorizing Provider  albuterol (PROVENTIL HFA;VENTOLIN HFA) 108 (90 BASE) MCG/ACT inhaler Inhale 2 puffs into the lungs every 6 (six) hours as needed  for wheezing or shortness of breath.    Historical Provider, MD  amLODipine (NORVASC) 10 MG tablet Take 10 mg by mouth daily.  06/13/14   Historical Provider, MD  aspirin 81 MG tablet Take 81 mg by mouth daily.      Historical Provider, MD  Calcium Carb-Cholecalciferol (CALCIUM 600 + D PO) Take 1 tablet by mouth daily.    Historical Provider, MD  gabapentin (NEURONTIN) 100 MG capsule Take 100-200 mg by mouth at bedtime. 100 mg in the am and 200 mg in evening    Historical Provider, MD  HYDROcodone-acetaminophen (NORCO) 5-325 MG per tablet Take 1 tablet by mouth every 6 (six) hours as needed for moderate pain. 08/21/14   Algernon Huxley, MD  HYDROcodone-acetaminophen (NORCO/VICODIN) 5-325 MG per tablet Take 1-2 tablets by mouth every 6 hours as needed for pain and/or cough. 07/18/14   Elmyra Ricks  Pisciotta, PA-C  hydrOXYzine (ATARAX/VISTARIL) 25 MG tablet Take 25 mg by mouth.    Historical Provider, MD  Insulin Aspart Prot & Aspart (NOVOLOG MIX 70/30 [place]) Inject 35-45 Units into the skin 2 (two) times daily. 35 units every morning and 45 units every evening    Historical Provider, MD  Insulin Detemir (LEVEMIR FLEXTOUCH) 100 UNIT/ML Pen Inject 20 Units into the skin at bedtime. 11/17/13   Historical Provider, MD  labetalol (NORMODYNE) 200 MG tablet Take 200 mg by mouth at bedtime.     Historical Provider, MD  linagliptin (TRADJENTA) 5 MG TABS tablet Take 5 mg by mouth at bedtime.    Historical Provider, MD  methimazole (TAPAZOLE) 10 MG tablet Take 10 mg by mouth at bedtime.  04/20/14   Historical Provider, MD  multivitamin (RENA-VIT) TABS tablet Take 1 tablet by mouth daily.    Historical Provider, MD  omeprazole (PRILOSEC) 20 MG capsule TAKE 1 CAPSULE BY MOUTH EVERY DAY 07/09/14   Gatha Mayer, MD  ondansetron (ZOFRAN) 4 MG tablet Take 1 tablet (4 mg total) by mouth every 8 (eight) hours as needed for nausea or vomiting. 07/18/14   Elmyra Ricks Pisciotta, PA-C  sevelamer carbonate (RENVELA) 800 MG tablet Take 800-1,600 mg  by mouth 3 (three) times daily with meals. 1600 mg with meals and 800 mg with snacks    Historical Provider, MD  simvastatin (ZOCOR) 5 MG tablet Take 5 mg by mouth at bedtime.    Historical Provider, MD  topiramate (TOPAMAX) 50 MG tablet Take 50 mg by mouth daily.     Historical Provider, MD   BP 133/85 mmHg  Pulse 104  Temp(Src) 98.2 F (36.8 C) (Oral)  Resp 18  Ht 5\' 7"  (1.702 m)  Wt 225 lb (102.059 kg)  BMI 35.23 kg/m2  SpO2 100%  LMP 08/14/2014 Physical Exam  Constitutional: She is oriented to person, place, and time. She appears well-developed and well-nourished. No distress.  HENT:  Head: Normocephalic and atraumatic.  Mouth/Throat: Oropharynx is clear and moist.    Eyes: EOM are normal. Pupils are equal, round, and reactive to light.  Neck: Normal range of motion. Neck supple. No tracheal deviation present.  Mild anterior throat tenderness  Cardiovascular: Normal rate and regular rhythm.  Exam reveals no friction rub.   No murmur heard. Pulmonary/Chest: Effort normal and breath sounds normal. No respiratory distress. She has no wheezes. She has no rales.  Abdominal: Soft. She exhibits no distension. There is no tenderness. There is no rebound.  Musculoskeletal: Normal range of motion. She exhibits no edema.  Neurological: She is alert and oriented to person, place, and time.  Skin: She is not diaphoretic.  Nursing note and vitals reviewed.   ED Course  Procedures (including critical care time) Labs Review Labs Reviewed - No data to display  Imaging Review No results found.   EKG Interpretation None      MDM   Final diagnoses:  Throat irritation    39 year old female here with some cough productive of mild blood and some vomiting with mild blood streaks. In light of her recent procedure with LMA placement, suspect this is local trauma. She has no history of gastric ulcers, varices. Is no history of pulmonary embolism. She's not having frank hemoptysis or  frank hematemesis. She's having blood mixed with mucus for both her cough and vomiting. Hearing here. He is also having some throat soreness and she has a mild scrape on her right upper palate. Is not actively  bleeding, but could be the source of some bleeding earlier. With LMA placement yesterday, this seems likely to be related to some direct trauma. Will observe and give GI cocktail to help provide some local throat anesthesia. Feeling better, stable for discharge. Has dialysis tomorrow where she will see a doctor for f/u.   Evelina Bucy, MD 08/22/14 [phone]

## 2014-08-27 ENCOUNTER — Other Ambulatory Visit: Payer: Self-pay | Admitting: Internal Medicine

## 2014-10-03 ENCOUNTER — Other Ambulatory Visit: Payer: Self-pay | Admitting: Internal Medicine

## 2014-12-22 ENCOUNTER — Emergency Department (HOSPITAL_COMMUNITY)
Admission: EM | Admit: 2014-12-22 | Discharge: 2014-12-23 | Discharge status: Against Medical Advice | Payer: Medicare Other | Attending: Emergency Medicine | Admitting: Emergency Medicine

## 2014-12-22 ENCOUNTER — Encounter (HOSPITAL_COMMUNITY): Payer: Self-pay | Admitting: *Deleted

## 2014-12-22 DIAGNOSIS — Y9289 Other specified places as the place of occurrence of the external cause: Secondary | ICD-10-CM | POA: Diagnosis not present

## 2014-12-22 DIAGNOSIS — I120 Hypertensive chronic kidney disease with stage 5 chronic kidney disease or end stage renal disease: Secondary | ICD-10-CM | POA: Diagnosis not present

## 2014-12-22 DIAGNOSIS — Z992 Dependence on renal dialysis: Secondary | ICD-10-CM | POA: Diagnosis not present

## 2014-12-22 DIAGNOSIS — J45909 Unspecified asthma, uncomplicated: Secondary | ICD-10-CM | POA: Insufficient documentation

## 2014-12-22 DIAGNOSIS — I2510 Atherosclerotic heart disease of native coronary artery without angina pectoris: Secondary | ICD-10-CM | POA: Insufficient documentation

## 2014-12-22 DIAGNOSIS — G8929 Other chronic pain: Secondary | ICD-10-CM | POA: Insufficient documentation

## 2014-12-22 DIAGNOSIS — Y998 Other external cause status: Secondary | ICD-10-CM | POA: Insufficient documentation

## 2014-12-22 DIAGNOSIS — S3991XA Unspecified injury of abdomen, initial encounter: Secondary | ICD-10-CM | POA: Insufficient documentation

## 2014-12-22 DIAGNOSIS — E119 Type 2 diabetes mellitus without complications: Secondary | ICD-10-CM | POA: Diagnosis not present

## 2014-12-22 DIAGNOSIS — I509 Heart failure, unspecified: Secondary | ICD-10-CM | POA: Insufficient documentation

## 2014-12-22 DIAGNOSIS — S0993XA Unspecified injury of face, initial encounter: Secondary | ICD-10-CM | POA: Diagnosis present

## 2014-12-22 DIAGNOSIS — Y9389 Activity, other specified: Secondary | ICD-10-CM | POA: Diagnosis not present

## 2014-12-22 DIAGNOSIS — N186 End stage renal disease: Secondary | ICD-10-CM | POA: Diagnosis not present
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
## 2015-01-22 ENCOUNTER — Other Ambulatory Visit: Payer: Self-pay | Admitting: Internal Medicine

## 2015-02-09 ENCOUNTER — Encounter (HOSPITAL_COMMUNITY): Payer: Self-pay | Admitting: Emergency Medicine

## 2015-02-09 ENCOUNTER — Other Ambulatory Visit (HOSPITAL_COMMUNITY)
Admission: AD | Admit: 2015-02-09 | Discharge: 2015-02-09 | Disposition: A | Discharge status: Home / Self Care | Payer: Medicare Other | Source: Other Acute Inpatient Hospital | Attending: Vascular Surgery | Admitting: Vascular Surgery

## 2015-02-09 ENCOUNTER — Emergency Department (INDEPENDENT_AMBULATORY_CARE_PROVIDER_SITE_OTHER)
Admission: EM | Admit: 2015-02-09 | Discharge: 2015-02-09 | Disposition: A | Discharge status: Home / Self Care | Payer: Medicare Other | Source: Home / Self Care

## 2015-02-09 DIAGNOSIS — H9202 Otalgia, left ear: Secondary | ICD-10-CM | POA: Diagnosis not present

## 2015-02-09 DIAGNOSIS — J029 Acute pharyngitis, unspecified: Secondary | ICD-10-CM | POA: Insufficient documentation

## 2015-02-09 DIAGNOSIS — R591 Generalized enlarged lymph nodes: Secondary | ICD-10-CM

## 2015-02-09 DIAGNOSIS — R599 Enlarged lymph nodes, unspecified: Secondary | ICD-10-CM | POA: Insufficient documentation

## 2015-02-09 LAB — POCT RAPID STREP A: Streptococcus, Group A Screen (Direct): NEGATIVE

## 2015-02-09 MED ORDER — TRAMADOL HCL 50 MG PO TABS: 50.0000 mg | ORAL_TABLET | Freq: Four times a day (QID) | ORAL | Status: DC | PRN

## 2015-02-09 MED ORDER — AMOXICILLIN 500 MG PO CAPS: 500.0000 mg | ORAL_CAPSULE | Freq: Three times a day (TID) | ORAL | Status: DC

## 2015-02-09 NOTE — ED Provider Notes (Signed)
CSN: [id number]     Arrival date & time 02/09/15  1930 History   None    Chief Complaint  Patient presents with  . Otalgia   (Consider location/radiation/quality/duration/timing/severity/associated sxs/prior Treatment) HPI  Linda Ellison is a 39 y.o. female with history of seasonal allergies, ESRD (dialysis MWF), asthma, Diabetes, HTN, CHF presenting with 4 day history of left ear pain, sore throat, and cough. Patient states that left ear originally felt pruritic "like something was inside" and then became painful. Pain has increased over the last couple days. Pain radiates from beneath the ear to the jaw and down the neck. Nothing makes it better or worse. Also has constant sore throat that is worse in the morning and evening that is unrelieved with antiseptic spray. Sore throat worsened with swallowing and food/fluid intake has decreased due to pain. Had 4 episodes of loose stools yesterday and 2 episodes of loose stool today. Stool is without blood. Temperature of 99.70F (oral) at home today with chills. Last Tylenol dose at noon today. Denies dizziness, chest pain, vomiting, constipation, numbness, or tingling.   Past Medical History  Diagnosis Date  . Asthma   . ESRD on hemodialysis ([place])     started dialysis 01/29/11  . Weight loss   . Loss of appetite   . CHF (congestive heart failure) ([place])   . Hypertension     a. Also with hx of hypotension - has been started on midodrine.  . Hyperlipidemia   . Diabetes mellitus   . Peripheral neuropathy ([place])   . Iron deficiency anemia   . Hyperparathyroidism, secondary ([place])   . Sciatica   . GERD (gastroesophageal reflux disease)   . Adenoma   . Gastroparesis   . Esophageal erosions     a. Per EGD 10/2011.  Marland Kitchen CAD (coronary artery disease)     a. False positive stress echo 06/2012 at [facility] - cath with mild nonobstructive CAD at [facility] (mild luminal irregularities in LAD, moderate diffuse disease in distal RCA).  . History of echocardiogram      Echo 9/14: EF 55-60%, normal wall motion, normal diastolic function, PASP 21  . Chronic chest pain     a. H/o longstanding atypical CP. Nonobstructive cath 06/2012 and negative VQ.  Marland Kitchen ASCVD (arteriosclerotic cardiovascular disease)   . Anginal pain ([place])   . Peripheral vascular disease ([place])   . PONV (postoperative nausea and vomiting)    Past Surgical History  Procedure Laterality Date  . Cesarean section    . Cholecystectomy  1996  . Av fistula placement  08/2010    Left radiocephalic AVF  . Ligation goretex fistula  01/04/11    Left AVF  . Colonoscopy    . Esophagogastroduodenoscopy  10/19/2011    Dr. Silvano Rusk  . Foot surgery Left   . Tubal ligation    . Left heart catheterization with coronary angiogram N/A 06/27/2012    Procedure: LEFT HEART CATHETERIZATION WITH CORONARY ANGIOGRAM;  Surgeon: Larey Dresser, MD;  Location: [facility] CATH LAB;  Service: Cardiovascular;  Laterality: N/A;  . Fracture surgery Left     left femur  . Dilation and curettage of uterus    . Av fistula placement Right 05/07/2014    Procedure: ARTERIOVENOUS (AV) FISTULA CREATION;  Surgeon: Elam Dutch, MD;  Location: [place];  Service: Vascular;  Laterality: Right;  . Av fistula placement Right 07/02/2014    Procedure: INSERTION OF ARTERIOVENOUS (AV) GORE-TEX GRAFT ARM;  Surgeon: Elam Dutch, MD;  Location: [facility] OR;  Service: Vascular;  Laterality: Right;  . Thrombectomy and revision of arterioventous (av) goretex  graft Right 07/16/2014    Procedure: THROMBECTOMY  Right  arm  ARTERIOVENOUS  GORETEX  GRAFT;  Surgeon: Elam Dutch, MD;  Location: [place];  Service: Vascular;  Laterality: Right;  . Peripheral vascular catheterization Left 08/08/2014    Procedure: Upper Extremity Angiography;  Surgeon: Algernon Huxley, MD;  Location: [place] CV LAB;  Service: Cardiovascular;  Laterality: Left;  . Peripheral vascular catheterization Left 08/08/2014    Procedure: Upper Extremity Intervention;  Surgeon: Algernon Huxley, MD;  Location: [place] CV LAB;  Service: Cardiovascular;  Laterality: Left;  . Ligation of arteriovenous  fistula Left 08/21/2014    Procedure: LIGATION OF ARTERIOVENOUS  FISTULA;  Surgeon: Algernon Huxley, MD;  Location: [facility] ORS;  Service: Vascular;  Laterality: Left;   Family History  Problem Relation Age of Onset  . Heart disease Mother     heart attack at 5 y/o-infected valve  . Kidney disease Mother     was a dialysis patient  . Heart disease Brother   . Kidney disease Maternal Grandmother     pre-dialysis  . Kidney failure Paternal Uncle   . Kidney failure Paternal Aunt   . Diabetes Father   . Hypertension Father   . Colon cancer Neg Hx   . Arthritis Brother   . Diabetes Maternal Aunt   . Hypertension Maternal Aunt   . Diabetes Paternal Aunt   . Heart disease Paternal Aunt   . Hypertension Paternal Aunt    Social History  Substance Use Topics  . Smoking status: Never Smoker   . Smokeless tobacco: Never Used  . Alcohol Use: No   OB History    No data available     Review of Systems  Constitutional: Positive for fever ("low grade 99.6"), chills and appetite change. Negative for diaphoresis and fatigue.  HENT: Positive for congestion, ear pain, facial swelling, postnasal drip, rhinorrhea, sore throat and voice change (voice is more hoarse). Negative for dental problem, drooling, ear discharge, hearing loss and sneezing.   Eyes: Negative for discharge and visual disturbance.  Respiratory: Positive for cough. Negative for chest tightness, shortness of breath and wheezing.   Cardiovascular: Negative for chest pain and palpitations.  Gastrointestinal: Positive for nausea and diarrhea. Negative for vomiting, abdominal pain, constipation and blood in stool.  Genitourinary: Negative for dysuria.  Skin: Negative for rash and wound.  Neurological: Positive for headaches. Negative for dizziness, syncope, weakness, light-headedness and numbness.    Allergies   Chlorhexidine gluconate; Ciprofloxacin; Fluocinolone; Pineapple; Strawberry extract; Adhesive; Clindamycin; Clindamycin/lincomycin; Oxycodone; and Shrimp  Home Medications   Prior to Admission medications   Medication Sig Start Date End Date Taking? Authorizing Provider  albuterol (PROVENTIL HFA;VENTOLIN HFA) 108 (90 BASE) MCG/ACT inhaler Inhale 2 puffs into the lungs every 6 (six) hours as needed for wheezing or shortness of breath.    Historical Provider, MD  amLODipine (NORVASC) 10 MG tablet Take 10 mg by mouth daily.  06/13/14   Historical Provider, MD  amoxicillin (AMOXIL) 500 MG capsule Take 1 capsule (500 mg total) by mouth 3 (three) times daily. 02/09/15   Janne Napoleon, NP  aspirin 81 MG tablet Take 81 mg by mouth daily.      Historical Provider, MD  Calcium Carb-Cholecalciferol (CALCIUM 600 + D PO) Take 1 tablet by mouth daily.    Historical Provider, MD  gabapentin (NEURONTIN) 100 MG  capsule Take 100-200 mg by mouth at bedtime. 100 mg in the am and 200 mg in evening    Historical Provider, MD  HYDROcodone-acetaminophen (NORCO) 5-325 MG per tablet Take 1 tablet by mouth every 6 (six) hours as needed for moderate pain. 08/21/14   Algernon Huxley, MD  HYDROcodone-acetaminophen (NORCO/VICODIN) 5-325 MG per tablet Take 1-2 tablets by mouth every 6 hours as needed for pain and/or cough. 07/18/14   Nicole Pisciotta, PA-C  hydrOXYzine (ATARAX/VISTARIL) 25 MG tablet Take 25 mg by mouth.    Historical Provider, MD  Insulin Aspart Prot & Aspart (NOVOLOG MIX 70/30 [place]) Inject 35-45 Units into the skin 2 (two) times daily. 35 units every morning and 45 units every evening    Historical Provider, MD  Insulin Detemir (LEVEMIR FLEXTOUCH) 100 UNIT/ML Pen Inject 20 Units into the skin at bedtime. 11/17/13   Historical Provider, MD  labetalol (NORMODYNE) 200 MG tablet Take 200 mg by mouth at bedtime.     Historical Provider, MD  linagliptin (TRADJENTA) 5 MG TABS tablet Take 5 mg by mouth at bedtime.    Historical  Provider, MD  methimazole (TAPAZOLE) 10 MG tablet Take 10 mg by mouth at bedtime.  04/20/14   Historical Provider, MD  multivitamin (RENA-VIT) TABS tablet Take 1 tablet by mouth daily.    Historical Provider, MD  omeprazole (PRILOSEC) 20 MG capsule TAKE 1 CAPSULE BY MOUTH EVERY DAY 01/23/15   Gatha Mayer, MD  ondansetron (ZOFRAN) 4 MG tablet Take 1 tablet (4 mg total) by mouth every 8 (eight) hours as needed for nausea or vomiting. 07/18/14   Elmyra Ricks Pisciotta, PA-C  sevelamer carbonate (RENVELA) 800 MG tablet Take 800-1,600 mg by mouth 3 (three) times daily with meals. 1600 mg with meals and 800 mg with snacks    Historical Provider, MD  simvastatin (ZOCOR) 5 MG tablet Take 5 mg by mouth at bedtime.    Historical Provider, MD  topiramate (TOPAMAX) 50 MG tablet Take 50 mg by mouth daily.     Historical Provider, MD  traMADol (ULTRAM) 50 MG tablet Take 1 tablet (50 mg total) by mouth every 6 (six) hours as needed. 02/09/15   Janne Napoleon, NP   Meds Ordered and Administered this Visit  Medications - No data to display  BP 153/96 mmHg  Pulse 99  Temp(Src) 97.4 F (36.3 C) (Oral)  Resp 17  SpO2 100%  LMP 01/18/2015 No data found.   Physical Exam  Constitutional: She is oriented to person, place, and time. She appears well-developed and well-nourished. No distress.  HENT:  Head: Normocephalic and atraumatic.  Right Ear: Tympanic membrane and external ear normal.  Left Ear: Hearing normal. No lacerations. There is tenderness. No drainage or swelling. No foreign bodies. Tympanic membrane is retracted (slightly). Tympanic membrane is not perforated, not erythematous and not bulging. No hemotympanum.  Nose: Mucosal edema and rhinorrhea present. No sinus tenderness.  Mouth/Throat: Uvula is midline. Posterior oropharyngeal erythema present. No oropharyngeal exudate.  Eyes: Conjunctivae and EOM are normal. Pupils are equal, round, and reactive to light.  Neck: Normal range of motion. Muscular  tenderness present. No rigidity.  Patient states that palpation of neck and throat on the left side is acutely painful. No swelling or erythema noted. No discrete mass palpated, tender thickening posterior and inferior to the L ear and angle of the jaw.   Cardiovascular: Normal rate, regular rhythm and normal heart sounds.   Pulmonary/Chest: Effort normal and breath sounds normal.  Musculoskeletal:  She exhibits no edema.  Neurological: She is alert and oriented to person, place, and time.  Skin: Skin is warm and dry. She is not diaphoretic.  AV fistula for dialysis site on left forearm with thrill and without signs of infection   Psychiatric: She has a normal mood and affect.    ED Course  Procedures (including critical care time)  Labs Review Labs Reviewed  POCT RAPID STREP A   Results for orders placed or performed during the hospital encounter of 02/09/15  POCT rapid strep A [facility])  Result Value Ref Range   Streptococcus, Group A Screen (Direct) NEGATIVE NEGATIVE     Imaging Review No results found.   Visual Acuity Review  Right Eye Distance:   Left Eye Distance:   Bilateral Distance:    Right Eye Near:   Left Eye Near:    Bilateral Near:         MDM   1. Otalgia, left   2. Lymphadenopathy of head and neck   3. Pharyngitis    Amoxicillin as dir Tramadol 50 mg  #15 and ibuprofen for pain F/u with your PCP this week    Janne Napoleon, NP 02/09/15 2042

## 2015-02-09 NOTE — Discharge Instructions (Signed)
Lymphadenopathy Lymphadenopathy refers to swollen or enlarged lymph glands, also called lymph nodes. Lymph glands are part of your body's defense (immune) system, which protects the body from infections, germs, and diseases. Lymph glands are found in many locations in your body, including the neck, underarm, and groin.  Many things can cause lymph glands to become enlarged. When your immune system responds to germs, such as viruses or bacteria, infection-fighting cells and fluid build up. This causes the glands to grow in size. Usually, this is not something to worry about. The swelling and any soreness often go away without treatment. However, swollen lymph glands can also be caused by a number of diseases. Your health care provider may do various tests to help determine the cause. If the cause of your swollen lymph glands cannot be found, it is important to monitor your condition to make sure the swelling goes away. HOME CARE INSTRUCTIONS Watch your condition for any changes. The following actions may help to lessen any discomfort you are feeling:  Get plenty of rest.  Take medicines only as directed by your health care provider. Your health care provider may recommend over-the-counter medicines for pain.  Apply moist heat compresses to the site of swollen lymph nodes as directed by your health care provider. This can help reduce any pain.  Check your lymph nodes daily for any changes.  Keep all follow-up visits as directed by your health care provider. This is important. SEEK MEDICAL CARE IF:  Your lymph nodes are still swollen after 2 weeks.  Your swelling increases or spreads to other areas.  Your lymph nodes are hard, seem fixed to the skin, or are growing rapidly.  Your skin over the lymph nodes is red and inflamed.  You have a fever.  You have chills.  You have fatigue.  You develop a sore throat.  You have abdominal pain.  You have weight loss.  You have night  sweats. SEEK IMMEDIATE MEDICAL CARE IF:  You notice fluid leaking from the area of the enlarged lymph node.  You have severe pain in any area of your body.  You have chest pain.  You have shortness of breath.   This information is not intended to replace advice given to you by your health care provider. Make sure you discuss any questions you have with your health care provider.   Document Released: 12/30/2007 Document Revised: 04/12/2014 Document Reviewed: 10/25/2013 Elsevier Interactive Patient Education 2016 Elsevier Inc[employer]  Pharyngitis Pharyngitis is a sore throat (pharynx). There is redness, pain, and swelling of your throat. HOME CARE   Drink enough fluids to keep your pee (urine) clear or pale yellow.  Only take medicine as told by your doctor.  You may get sick again if you do not take medicine as told. Finish your medicines, even if you start to feel better.  Do not take aspirin.  Rest.  Rinse your mouth (gargle) with salt water ( tsp of salt per 1 qt of water) every 1-2 hours. This will help the pain.  If you are not at risk for choking, you can suck on hard candy or sore throat lozenges. GET HELP IF:  You have large, tender lumps on your neck.  You have a rash.  You cough up green, yellow-brown, or bloody spit. GET HELP RIGHT AWAY IF:   You have a stiff neck.  You drool or cannot swallow liquids.  You throw up (vomit) or are not able to keep medicine or liquids down.  You have very bad pain that does not go away with medicine.  You have problems breathing (not from a stuffy nose). MAKE SURE YOU:   Understand these instructions.  Will watch your condition.  Will get help right away if you are not doing well or get worse.   This information is not intended to replace advice given to you by your health care provider. Make sure you discuss any questions you have with your health care provider.   Document Released: 09/08/2007 Document Revised:  01/10/2013 Document Reviewed: 11/27/2012 Elsevier Interactive Patient Education 2016 Elsevier Inc.  Sore Throat A sore throat is a painful, burning, sore, or scratchy feeling of the throat. There may be pain or tenderness when swallowing or talking. You may have other symptoms with a sore throat. These include coughing, sneezing, fever, or a swollen neck. A sore throat is often the first sign of another sickness. These sicknesses may include a cold, flu, strep throat, or an infection called mono. Most sore throats go away without medical treatment.  HOME CARE   Only take medicine as told by your doctor.  Drink enough fluids to keep your pee (urine) clear or pale yellow.  Rest as needed.  Try using throat sprays, lozenges, or suck on hard candy (if older than 4 years or as told).  Sip warm liquids, such as broth, herbal tea, or warm water with honey. Try sucking on frozen ice pops or drinking cold liquids.  Rinse the mouth (gargle) with salt water. Mix 1 teaspoon salt with 8 ounces of water.  Do not smoke. Avoid being around others when they are smoking.  Put a humidifier in your bedroom at night to moisten the air. You can also turn on a hot shower and sit in the bathroom for 5-10 minutes. Be sure the bathroom door is closed. GET HELP RIGHT AWAY IF:   You have trouble breathing.  You cannot swallow fluids, soft foods, or your spit (saliva).  You have more puffiness (swelling) in the throat.  Your sore throat does not get better in 7 days.  You feel sick to your stomach (nauseous) and throw up (vomit).  You have a fever or lasting symptoms for more than 2-3 days.  You have a fever and your symptoms suddenly get worse. MAKE SURE YOU:   Understand these instructions.  Will watch your condition.  Will get help right away if you are not doing well or get worse.   This information is not intended to replace advice given to you by your health care provider. Make sure you  discuss any questions you have with your health care provider.   Document Released: 12/30/2007 Document Revised: 12/15/2011 Document Reviewed: 11/28/2011 Elsevier Interactive Patient Education [employer].

## 2015-02-09 NOTE — ED Notes (Signed)
Left ear , face, head and throat hurting, onset 4 days ago.  Patient has a cough

## 2015-02-12 LAB — CULTURE, GROUP A STREP

## 2015-02-21 ENCOUNTER — Encounter: Payer: Self-pay | Admitting: Cardiovascular Disease

## 2015-02-21 ENCOUNTER — Ambulatory Visit (INDEPENDENT_AMBULATORY_CARE_PROVIDER_SITE_OTHER): Payer: Medicare Other | Admitting: Cardiovascular Disease

## 2015-02-21 VITALS — BP 124/70 | HR 108 | Ht 66.5 in | Wt 221.0 lb

## 2015-02-21 DIAGNOSIS — E785 Hyperlipidemia, unspecified: Secondary | ICD-10-CM | POA: Diagnosis not present

## 2015-02-21 DIAGNOSIS — I25119 Atherosclerotic heart disease of native coronary artery with unspecified angina pectoris: Secondary | ICD-10-CM

## 2015-02-21 DIAGNOSIS — I10 Essential (primary) hypertension: Secondary | ICD-10-CM

## 2015-02-21 NOTE — Patient Instructions (Signed)

## 2015-02-21 NOTE — Progress Notes (Signed)
Cardiology Office Note Date:  02/23/2015   ID:  Linda Ellison, DOB 12-May-1975, MRN [id number]  PCP:  Linda Bravo, MD  Cardiologist:  Sherren Mocha, MD    Chief Complaint  Patient presents with  . Chest Pain    History of Present Illness: Linda Ellison is a 39 y.o. female who presents for follow-up evaluation. She has been followed for chronic chest pain. In 2014 she underwent cardiac catheterization demonstrating minor nonobstructive CAD. The patient has end-stage renal disease. She is on the waiting list for renal transplantation at [facility] and [facility].  Overall she is doing well. She denies recurrence of chest pain. She has not taken any sublingual nitroglycerin. She is managing her antihypertensive medications around dialysis. She occasionally has headaches when her blood pressure is elevated. She denies any episodes of lightheadedness or syncope. No leg swelling, orthopnea, or PND.  Past Medical History  Diagnosis Date  . Asthma   . ESRD on hemodialysis ([place])     started dialysis 01/29/11  . Weight loss   . Loss of appetite   . CHF (congestive heart failure) ([place])   . Hypertension     a. Also with hx of hypotension - has been started on midodrine.  . Hyperlipidemia   . Diabetes mellitus   . Peripheral neuropathy ([place])   . Iron deficiency anemia   . Hyperparathyroidism, secondary ([place])   . Sciatica   . GERD (gastroesophageal reflux disease)   . Adenoma   . Gastroparesis   . Esophageal erosions     a. Per EGD 10/2011.  Marland Kitchen CAD (coronary artery disease)     a. False positive stress echo 06/2012 at [facility] - cath with mild nonobstructive CAD at [facility] (mild luminal irregularities in LAD, moderate diffuse disease in distal RCA).  . History of echocardiogram     Echo 9/14: EF 55-60%, normal wall motion, normal diastolic function, PASP 21  . Chronic chest pain     a. H/o longstanding atypical CP. Nonobstructive cath 06/2012 and negative VQ.  Marland Kitchen ASCVD  (arteriosclerotic cardiovascular disease)   . Anginal pain ([place])   . Peripheral vascular disease ([place])   . PONV (postoperative nausea and vomiting)     Past Surgical History  Procedure Laterality Date  . Cesarean section    . Cholecystectomy  1996  . Av fistula placement  08/2010    Left radiocephalic AVF  . Ligation goretex fistula  01/04/11    Left AVF  . Colonoscopy    . Esophagogastroduodenoscopy  10/19/2011    Dr. Silvano Rusk  . Foot surgery Left   . Tubal ligation    . Left heart catheterization with coronary angiogram N/A 06/27/2012    Procedure: LEFT HEART CATHETERIZATION WITH CORONARY ANGIOGRAM;  Surgeon: Larey Dresser, MD;  Location: [facility] CATH LAB;  Service: Cardiovascular;  Laterality: N/A;  . Fracture surgery Left     left femur  . Dilation and curettage of uterus    . Av fistula placement Right 05/07/2014    Procedure: ARTERIOVENOUS (AV) FISTULA CREATION;  Surgeon: Elam Dutch, MD;  Location: [place];  Service: Vascular;  Laterality: Right;  . Av fistula placement Right 07/02/2014    Procedure: INSERTION OF ARTERIOVENOUS (AV) GORE-TEX GRAFT ARM;  Surgeon: Elam Dutch, MD;  Location: [facility] OR;  Service: Vascular;  Laterality: Right;  . Thrombectomy and revision of arterioventous (av) goretex  graft Right 07/16/2014    Procedure: THROMBECTOMY  Right  arm  ARTERIOVENOUS  GORETEX  GRAFT;  Surgeon: Elam Dutch, MD;  Location: [place];  Service: Vascular;  Laterality: Right;  . Peripheral vascular catheterization Left 08/08/2014    Procedure: Upper Extremity Angiography;  Surgeon: Algernon Huxley, MD;  Location: [place] CV LAB;  Service: Cardiovascular;  Laterality: Left;  . Peripheral vascular catheterization Left 08/08/2014    Procedure: Upper Extremity Intervention;  Surgeon: Algernon Huxley, MD;  Location: [place] CV LAB;  Service: Cardiovascular;  Laterality: Left;  . Ligation of arteriovenous  fistula Left 08/21/2014    Procedure: LIGATION OF ARTERIOVENOUS  FISTULA;   Surgeon: Algernon Huxley, MD;  Location: [facility] ORS;  Service: Vascular;  Laterality: Left;    Current Outpatient Prescriptions  Medication Sig Dispense Refill  . albuterol (PROVENTIL HFA;VENTOLIN HFA) 108 (90 BASE) MCG/ACT inhaler Inhale 2 puffs into the lungs every 6 (six) hours as needed for wheezing or shortness of breath.    Marland Kitchen amLODipine (NORVASC) 10 MG tablet Take 10 mg by mouth at bedtime.     Marland Kitchen aspirin 81 MG tablet Take 81 mg by mouth daily.      . Calcium Carb-Cholecalciferol (CALCIUM 600 + D PO) Take 1 tablet by mouth daily.    . citalopram (CELEXA) 10 MG tablet Take 10 mg by mouth daily.    Marland Kitchen gabapentin (NEURONTIN) 100 MG capsule Take 100-200 mg by mouth at bedtime. 100 mg in the am and 200 mg in evening    . HYDROcodone-acetaminophen (NORCO) 5-325 MG per tablet Take 1 tablet by mouth every 6 (six) hours as needed for moderate pain. 30 tablet 0  . hydrOXYzine (ATARAX/VISTARIL) 25 MG tablet Take 25 mg by mouth.    . Insulin Aspart Prot & Aspart (NOVOLOG MIX 70/30 [place]) Inject 35-45 Units into the skin 2 (two) times daily. 35 units every morning and 45 units every evening    . Insulin Detemir (LEVEMIR FLEXTOUCH) 100 UNIT/ML Pen Inject 20 Units into the skin at bedtime.    Marland Kitchen labetalol (NORMODYNE) 300 MG tablet Take 300mg  by mouth twice a day, On dialysis days take 300mg  once a day    . linagliptin (TRADJENTA) 5 MG TABS tablet Take 5 mg by mouth at bedtime.    . methimazole (TAPAZOLE) 5 MG tablet Take 5 mg by mouth daily.    . multivitamin (RENA-VIT) TABS tablet Take 1 tablet by mouth daily.    Marland Kitchen omeprazole (PRILOSEC) 20 MG capsule TAKE 1 CAPSULE BY MOUTH EVERY DAY 30 capsule 2  . ondansetron (ZOFRAN) 4 MG tablet Take 1 tablet (4 mg total) by mouth every 8 (eight) hours as needed for nausea or vomiting. 10 tablet 0  . sevelamer carbonate (RENVELA) 800 MG tablet Take 800-1,600 mg by mouth 3 (three) times daily with meals. 1600 mg with meals and 800 mg with snacks    . simvastatin (ZOCOR) 5 MG  tablet Take 5 mg by mouth at bedtime.    . topiramate (TOPAMAX) 50 MG tablet Take 50 mg by mouth daily.     . traMADol (ULTRAM) 50 MG tablet Take 1 tablet (50 mg total) by mouth every 6 (six) hours as needed. 15 tablet 0   No current facility-administered medications for this visit.    Allergies:   Chlorhexidine gluconate; Ciprofloxacin; Fluocinolone; Pineapple; Strawberry extract; Adhesive; Clindamycin; Clindamycin/lincomycin; Oxycodone; and Shrimp   Social History:  The patient  reports that she has never smoked. She has never used smokeless tobacco. She reports that she does not drink  alcohol or use illicit drugs.   Family History:  The patient's  family history includes Arthritis in her brother; Diabetes in her father, maternal aunt, and paternal aunt; Heart disease in her brother, mother, and paternal aunt; Hypertension in her father, maternal aunt, and paternal aunt; Kidney disease in her maternal grandmother and mother; Kidney failure in her paternal aunt and paternal uncle. There is no history of Colon cancer.    ROS:  Please see the history of present illness.  Otherwise, review of systems is positive for easy bruising.  All other systems are reviewed and negative.    PHYSICAL EXAM: VS:  BP 124/70 mmHg  Pulse 108  Ht 5' 6.5" (1.689 m)  Wt 221 lb (100.245 kg)  BMI 35.14 kg/m2  LMP 01/18/2015 , BMI Body mass index is 35.14 kg/(m^2). GEN: Well nourished, well developed, in no acute distress HEENT: normal Neck: no JVD, no masses. No carotid bruits Cardiac: tachycardic and regular without murmur or gallop    Respiratory:  clear to auscultation bilaterally, normal work of breathing GI: soft, nontender, nondistended, + BS MS: no deformity or atrophy Ext: no pretibial edema, pedal pulses 2+= bilaterally Skin: warm and dry, no rash Neuro:  Strength and sensation are intact Psych: euthymic mood, full affect  EKG:  EKG is ordered today. The ekg ordered today shows sinus tachycardia  108 bpm, right atrial enlargement, otherwise within normal limits.  Recent Labs: 08/15/2014: BUN 31*; Creatinine, Ser 4.85*; Hemoglobin 10.2*; Platelets 280; Potassium 4.3; Sodium 139   Lipid Panel     Component Value Date/Time   CHOL 120 06/27/2012 0340   TRIG 85 06/27/2012 0340   TRIG 143 12/09/2008   HDL 49 06/27/2012 0340   CHOLHDL 2.4 06/27/2012 0340   VLDL 17 06/27/2012 0340   LDLCALC 54 06/27/2012 0340   LDLDIRECT 170.4 08/08/2008 0921      Wt Readings from Last 3 Encounters:  02/21/15 221 lb (100.245 kg)  12/22/14 223 lb 3 oz (101.237 kg)  08/22/14 225 lb (102.059 kg)     Cardiac Studies Reviewed: 12/27/2013: Study Conclusions  - Left ventricle: The cavity size was normal. Wall thickness was normal. Systolic function was normal. The estimated ejection fraction was in the range of 50% to 55%.  Cardiac Cath 06/27/2012: Procedural Findings: Hemodynamics:  AO 112/79 LV 103/11  Coronary angiography: Coronary dominance: right Left mainstem: No significant disease.  Left anterior descending (LAD): Mild luminal irregularities.  Left circumflex (LCx): No significant disease.  Right coronary artery (RCA): Moderate diffuse disease distal RCA.  Left ventriculography: Left ventricular systolic function is normal, LVEF is estimated at 60-65%, no regional wall motion abnormalities in the RAO projection.  ASSESSMENT AND PLAN: 1.  Chest pain: No recent symptoms. Continue observation. 2. Hypertension with end-stage renal disease: Antihypertensive management per nephrology. The patient is on a combination of amlodipine and labetalol. 3. Nonobstructive CAD: Cardiac catheterization from 2014 reviewed. Continue observation.  Current medicines are reviewed with the patient today.  The patient does not have concerns regarding medicines.  Labs/ tests ordered today include:   Orders Placed This Encounter  Procedures  . EKG 12-Lead    Disposition:   FU one  year  Signed, Sherren Mocha, MD  02/23/2015 10:42 PM    [facility] [address], [place], [place]  [address] Phone: [phone]; Fax: [phone]

## 2015-02-24 ENCOUNTER — Emergency Department (HOSPITAL_COMMUNITY)
Admission: EM | Admit: 2015-02-24 | Discharge: 2015-02-24 | Disposition: A | Discharge status: Home / Self Care | Payer: Medicare Other | Attending: Emergency Medicine | Admitting: Emergency Medicine

## 2015-02-24 ENCOUNTER — Emergency Department (HOSPITAL_COMMUNITY): Payer: Medicare Other

## 2015-02-24 ENCOUNTER — Encounter (HOSPITAL_COMMUNITY): Payer: Self-pay | Admitting: Emergency Medicine

## 2015-02-24 DIAGNOSIS — G8929 Other chronic pain: Secondary | ICD-10-CM | POA: Diagnosis not present

## 2015-02-24 DIAGNOSIS — Z862 Personal history of diseases of the blood and blood-forming organs and certain disorders involving the immune mechanism: Secondary | ICD-10-CM | POA: Insufficient documentation

## 2015-02-24 DIAGNOSIS — J45909 Unspecified asthma, uncomplicated: Secondary | ICD-10-CM | POA: Insufficient documentation

## 2015-02-24 DIAGNOSIS — Z859 Personal history of malignant neoplasm, unspecified: Secondary | ICD-10-CM | POA: Diagnosis not present

## 2015-02-24 DIAGNOSIS — Z794 Long term (current) use of insulin: Secondary | ICD-10-CM | POA: Diagnosis not present

## 2015-02-24 DIAGNOSIS — I25119 Atherosclerotic heart disease of native coronary artery with unspecified angina pectoris: Secondary | ICD-10-CM | POA: Insufficient documentation

## 2015-02-24 DIAGNOSIS — I509 Heart failure, unspecified: Secondary | ICD-10-CM | POA: Insufficient documentation

## 2015-02-24 DIAGNOSIS — Z79899 Other long term (current) drug therapy: Secondary | ICD-10-CM | POA: Diagnosis not present

## 2015-02-24 DIAGNOSIS — R0789 Other chest pain: Secondary | ICD-10-CM | POA: Insufficient documentation

## 2015-02-24 DIAGNOSIS — R079 Chest pain, unspecified: Secondary | ICD-10-CM | POA: Diagnosis present

## 2015-02-24 DIAGNOSIS — E119 Type 2 diabetes mellitus without complications: Secondary | ICD-10-CM | POA: Insufficient documentation

## 2015-02-24 DIAGNOSIS — N186 End stage renal disease: Secondary | ICD-10-CM | POA: Diagnosis not present

## 2015-02-24 DIAGNOSIS — Z992 Dependence on renal dialysis: Secondary | ICD-10-CM | POA: Insufficient documentation

## 2015-02-24 DIAGNOSIS — E785 Hyperlipidemia, unspecified: Secondary | ICD-10-CM | POA: Insufficient documentation

## 2015-02-24 DIAGNOSIS — Z7982 Long term (current) use of aspirin: Secondary | ICD-10-CM | POA: Diagnosis not present

## 2015-02-24 DIAGNOSIS — I120 Hypertensive chronic kidney disease with stage 5 chronic kidney disease or end stage renal disease: Secondary | ICD-10-CM | POA: Insufficient documentation

## 2015-02-24 DIAGNOSIS — G629 Polyneuropathy, unspecified: Secondary | ICD-10-CM | POA: Diagnosis not present

## 2015-02-24 DIAGNOSIS — K219 Gastro-esophageal reflux disease without esophagitis: Secondary | ICD-10-CM | POA: Insufficient documentation

## 2015-02-24 LAB — BASIC METABOLIC PANEL
Anion gap: 13 (ref 5–15)
BUN: 42 mg/dL — ABNORMAL HIGH (ref 6–20)
CO2: 25 mmol/L (ref 22–32)
Calcium: 8.7 mg/dL — ABNORMAL LOW (ref 8.9–10.3)
Chloride: 99 mmol/L — ABNORMAL LOW (ref 101–111)
Creatinine, Ser: 8.26 mg/dL — ABNORMAL HIGH (ref 0.44–1.00)
GFR, EST AFRICAN AMERICAN: 6 mL/min — AB (ref >60)
GFR, EST NON AFRICAN AMERICAN: 5 mL/min — AB (ref >60)
Glucose, Bld: 289 mg/dL — ABNORMAL HIGH (ref 65–99)
Potassium: 3.6 mmol/L (ref 3.5–5.1)
SODIUM: 137 mmol/L (ref 135–145)

## 2015-02-24 LAB — CBC WITH DIFFERENTIAL/PLATELET
BASOS PCT: 1 %
Basophils Absolute: 0.1 10*3/uL (ref 0.0–0.1)
EOS ABS: 0.1 10*3/uL (ref 0.0–0.7)
EOS PCT: 1 %
HCT: 37.4 % (ref 36.0–46.0)
HEMOGLOBIN: 11.7 g/dL — AB (ref 12.0–15.0)
Lymphocytes Relative: 20 %
Lymphs Abs: 2.1 10*3/uL (ref 0.7–4.0)
MCH: 27.7 pg (ref 26.0–34.0)
MCHC: 31.3 g/dL (ref 30.0–36.0)
MCV: 88.4 fL (ref 78.0–100.0)
MONO ABS: 0.6 10*3/uL (ref 0.1–1.0)
MONOS PCT: 5 %
Neutro Abs: 7.8 10*3/uL — ABNORMAL HIGH (ref 1.7–7.7)
Neutrophils Relative %: 73 %
PLATELETS: 276 10*3/uL (ref 150–400)
RBC: 4.23 MIL/uL (ref 3.87–5.11)
RDW: 13.8 % (ref 11.5–15.5)
WBC: 10.6 10*3/uL — ABNORMAL HIGH (ref 4.0–10.5)

## 2015-02-24 LAB — I-STAT TROPONIN, ED
TROPONIN I, POC: 0.01 ng/mL (ref 0.00–0.08)
TROPONIN I, POC: 0.01 ng/mL (ref 0.00–0.08)

## 2015-02-24 LAB — TROPONIN I

## 2015-02-24 LAB — D-DIMER, QUANTITATIVE: D-Dimer, Quant: 0.51 ug/mL-FEU — ABNORMAL HIGH (ref 0.00–0.50)

## 2015-02-24 MED ORDER — NITROGLYCERIN 2 % TD OINT
1.0000 [in_us] | TOPICAL_OINTMENT | Freq: Once | TRANSDERMAL | Status: AC
  Administered 2015-02-24: 1 [in_us] via TOPICAL
  Filled 2015-02-24: qty 1

## 2015-02-24 MED ORDER — KETOROLAC TROMETHAMINE 60 MG/2ML IM SOLN: 60.0000 mg | Freq: Once | INTRAMUSCULAR | Status: DC

## 2015-02-24 MED ORDER — MORPHINE SULFATE (PF) 4 MG/ML IV SOLN
4.0000 mg | Freq: Once | INTRAVENOUS | Status: AC
  Administered 2015-02-24: 4 mg via INTRAVENOUS
  Filled 2015-02-24: qty 1

## 2015-02-24 MED ORDER — IOHEXOL 350 MG/ML SOLN
100.0000 mL | Freq: Once | INTRAVENOUS | Status: AC | PRN
  Administered 2015-02-24: 100 mL via INTRAVENOUS

## 2015-02-24 NOTE — ED Notes (Signed)
Pt left with all her belongings and ambulated out of the treatment area.  

## 2015-02-24 NOTE — ED Provider Notes (Signed)
CSN: [id number]   Arrival date & time 02/24/15 0002  History  By signing my name below, I, Linda Ellison, attest that this documentation has been prepared under the direction and in the presence of Merryl Hacker, MD. Electronically Signed: Altamease Ellison, ED Scribe. 02/24/2015. 1:58 AM.  Chief Complaint  Patient presents with  . Chest Pain    HPI The history is provided by the patient. No language interpreter was used.   Linda Ellison is a 39 y.o. 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 The pain is not associated with food or exertion. This pain is not pleuritic and has no known triggers. Associated symptoms include new left leg swelling and pain. Pt denies fever, cough, and SOB. She last had dialysis on 02/21/15 and it was a full session. No history of DVT/PE or recent long travel. Her mother died at 57 of a MI.   Followed by Dr. Burt Knack. History of chronic chest pain. Cardiac catheterization in 2014 which showed nonocclusive disease of the LAD and RCA.  Past Medical History  Diagnosis Date  . Asthma   . ESRD on hemodialysis ([place])     started dialysis 01/29/11  . Weight loss   . Loss of appetite   . CHF (congestive heart failure) ([place])   . Hypertension     a. Also with hx of hypotension - has been started on midodrine.  . Hyperlipidemia   . Diabetes mellitus   . Peripheral neuropathy ([place])   . Iron deficiency anemia   . Hyperparathyroidism, secondary ([place])   . Sciatica   . GERD (gastroesophageal reflux disease)   . Adenoma   . Gastroparesis   . Esophageal erosions     a. Per EGD 10/2011.  Marland Kitchen CAD (coronary artery disease)     a. False positive stress echo 06/2012 at [facility] - cath with mild nonobstructive CAD at [facility] (mild luminal irregularities in LAD,  moderate diffuse disease in distal RCA).  . History of echocardiogram     Echo 9/14: EF 55-60%, normal wall motion, normal diastolic function, PASP 21  . Chronic chest pain     a. H/o longstanding atypical CP. Nonobstructive cath 06/2012 and negative VQ.  Marland Kitchen ASCVD (arteriosclerotic cardiovascular disease)   . Anginal pain ([place])   . Peripheral vascular disease ([place])   . PONV (postoperative nausea and vomiting)     Past Surgical History  Procedure Laterality Date  . Cesarean section    . Cholecystectomy  1996  . Av fistula placement  08/2010    Left radiocephalic AVF  . Ligation goretex fistula  01/04/11    Left AVF  . Colonoscopy    . Esophagogastroduodenoscopy  10/19/2011    Dr. Silvano Rusk  . Foot surgery Left   . Tubal ligation    . Left heart catheterization with coronary angiogram N/A 06/27/2012    Procedure: LEFT HEART CATHETERIZATION WITH CORONARY ANGIOGRAM;  Surgeon: Larey Dresser, MD;  Location: [facility] CATH LAB;  Service: Cardiovascular;  Laterality: N/A;  . Fracture surgery Left     left femur  . Dilation and curettage of uterus    . Av fistula placement Right 05/07/2014    Procedure: ARTERIOVENOUS (AV) FISTULA CREATION;  Surgeon: Elam Dutch, MD;  Location: [place];  Service: Vascular;  Laterality: Right;  .  Av fistula placement Right 07/02/2014    Procedure: INSERTION OF ARTERIOVENOUS (AV) GORE-TEX GRAFT ARM;  Surgeon: Elam Dutch, MD;  Location: [facility] OR;  Service: Vascular;  Laterality: Right;  . Thrombectomy and revision of arterioventous (av) goretex  graft Right 07/16/2014    Procedure: THROMBECTOMY  Right  arm  ARTERIOVENOUS  GORETEX  GRAFT;  Surgeon: Elam Dutch, MD;  Location: [place];  Service: Vascular;  Laterality: Right;  . Peripheral vascular catheterization Left 08/08/2014    Procedure: Upper Extremity Angiography;  Surgeon: Algernon Huxley, MD;  Location: [place] CV LAB;  Service: Cardiovascular;  Laterality: Left;  . Peripheral vascular catheterization  Left 08/08/2014    Procedure: Upper Extremity Intervention;  Surgeon: Algernon Huxley, MD;  Location: [place] CV LAB;  Service: Cardiovascular;  Laterality: Left;  . Ligation of arteriovenous  fistula Left 08/21/2014    Procedure: LIGATION OF ARTERIOVENOUS  FISTULA;  Surgeon: Algernon Huxley, MD;  Location: [facility] ORS;  Service: Vascular;  Laterality: Left;    Family History  Problem Relation Age of Onset  . Heart disease Mother     heart attack at 41 y/o-infected valve  . Kidney disease Mother     was a dialysis patient  . Heart disease Brother   . Kidney disease Maternal Grandmother     pre-dialysis  . Kidney failure Paternal Uncle   . Kidney failure Paternal Aunt   . Diabetes Father   . Hypertension Father   . Colon cancer Neg Hx   . Arthritis Brother   . Diabetes Maternal Aunt   . Hypertension Maternal Aunt   . Diabetes Paternal Aunt   . Heart disease Paternal Aunt   . Hypertension Paternal Aunt     Social History  Substance Use Topics  . Smoking status: Never Smoker   . Smokeless tobacco: Never Used  . Alcohol Use: No     Review of Systems  Constitutional: Negative for fever.  Respiratory: Negative for choking and shortness of breath.   Cardiovascular: Positive for chest pain and leg swelling.  Gastrointestinal: Negative for nausea and vomiting.   Home Medications   Prior to Admission medications   Medication Sig Start Date End Date Taking? Authorizing Provider  albuterol (PROVENTIL HFA;VENTOLIN HFA) 108 (90 BASE) MCG/ACT inhaler Inhale 2 puffs into the lungs every 6 (six) hours as needed for wheezing or shortness of breath.   Yes Historical Provider, MD  amLODipine (NORVASC) 10 MG tablet Take 10 mg by mouth at bedtime.  06/13/14  Yes Historical Provider, MD  aspirin 81 MG tablet Take 81 mg by mouth daily.     Yes Historical Provider, MD  Calcium Carb-Cholecalciferol (CALCIUM 600 + D PO) Take 1 tablet by mouth daily.   Yes Historical Provider, MD  citalopram (CELEXA) 10 MG  tablet Take 10 mg by mouth daily.   Yes Historical Provider, MD  gabapentin (NEURONTIN) 100 MG capsule Take 100-200 mg by mouth at bedtime. 100 mg in the am and 200 mg in evening   Yes Historical Provider, MD  hydrOXYzine (ATARAX/VISTARIL) 25 MG tablet Take 25 mg by mouth.   Yes Historical Provider, MD  Insulin Aspart Prot & Aspart (NOVOLOG MIX 70/30 [place]) Inject 35 Units into the skin every evening. 35 units every morning and 45 units every evening   Yes Historical Provider, MD  Insulin Detemir (LEVEMIR FLEXTOUCH) 100 UNIT/ML Pen Inject 40 Units into the skin every 12 (twelve) hours.  11/17/13  Yes Historical Provider, MD  labetalol (NORMODYNE) 300 MG tablet Take 300 mg by mouth See admin instructions. On dialysis days (MWF) take 300mg  only at bedtime. All other days take twice daily.   Yes Historical Provider, MD  linagliptin (TRADJENTA) 5 MG TABS tablet Take 5 mg by mouth at bedtime.   Yes Historical Provider, MD  methimazole (TAPAZOLE) 5 MG tablet Take 5 mg by mouth daily.   Yes Historical Provider, MD  multivitamin (RENA-VIT) TABS tablet Take 1 tablet by mouth daily.   Yes Historical Provider, MD  omeprazole (PRILOSEC) 20 MG capsule TAKE 1 CAPSULE BY MOUTH EVERY DAY 01/23/15  Yes Gatha Mayer, MD  ondansetron (ZOFRAN) 4 MG tablet Take 1 tablet (4 mg total) by mouth every 8 (eight) hours as needed for nausea or vomiting. 07/18/14  Yes Elmyra Ricks Pisciotta, PA-C  sevelamer carbonate (RENVELA) 800 MG tablet Take 800-1,600 mg by mouth 3 (three) times daily with meals. 1600 mg with meals and 800 mg with snacks   Yes Historical Provider, MD  simvastatin (ZOCOR) 5 MG tablet Take 5 mg by mouth at bedtime.   Yes Historical Provider, MD  topiramate (TOPAMAX) 50 MG tablet Take 50 mg by mouth daily.    Yes Historical Provider, MD  traMADol (ULTRAM) 50 MG tablet Take 1 tablet (50 mg total) by mouth every 6 (six) hours as needed. 02/09/15  Yes Janne Napoleon, NP  HYDROcodone-acetaminophen (NORCO) 5-325 MG per tablet Take  1 tablet by mouth every 6 (six) hours as needed for moderate pain. Patient not taking: Reported on 02/24/2015 08/21/14   Algernon Huxley, MD    Allergies  Chlorhexidine gluconate; Ciprofloxacin; Fluocinolone; Pineapple; Strawberry extract; Adhesive; Clindamycin; Clindamycin/lincomycin; Oxycodone; and Shrimp  Triage Vitals: BP 153/99 mmHg  Pulse 96  Temp(Src) 97.9 F (36.6 C) (Oral)  Resp 18  Wt 224 lb 4 oz (101.719 kg)  SpO2 97%  LMP 02/12/2015  Physical Exam  Constitutional: She is oriented to person, place, and time. She appears well-developed and well-nourished. No distress.  HENT:  Head: Normocephalic and atraumatic.  Cardiovascular: Normal rate, regular rhythm and normal heart sounds.   Fistula left upper extremity with positive thrill  Pulmonary/Chest: Effort normal and breath sounds normal. No respiratory distress. She has no wheezes.  Abdominal: Soft. Bowel sounds are normal. There is no tenderness. There is no rebound.  Neurological: She is alert and oriented to person, place, and time.  Skin: Skin is warm and dry.  Psychiatric: She has a normal mood and affect.  Nursing note and vitals reviewed.   ED Course  Procedures   DIAGNOSTIC STUDIES: Oxygen Saturation is 97% on RA, normal by my interpretation.    COORDINATION OF CARE: 12:46 AM Discussed treatment plan which includes lab work, CXR, and EKG with pt at bedside and pt agreed to plan.  1:57 AM I re-evaluated the patient and provided an update on the results of her lab work.    Labs Reviewed  CBC WITH DIFFERENTIAL/PLATELET - Abnormal; Notable for the following:    WBC 10.6 (*)    Hemoglobin 11.7 (*)    Neutro Abs 7.8 (*)    All other components within normal limits  BASIC METABOLIC PANEL - Abnormal; Notable for the following:    Chloride 99 (*)    Glucose, Bld 289 (*)    BUN 42 (*)    Creatinine, Ser 8.26 (*)    Calcium 8.7 (*)    GFR calc non Af Amer 5 (*)    GFR calc Af Amer 6 (*)  All other  components within normal limits  D-DIMER, QUANTITATIVE (NOT AT [facility]) - Abnormal; Notable for the following:    D-Dimer, Quant 0.51 (*)    All other components within normal limits  TROPONIN I  CBC  I-STAT TROPOININ, ED  Randolm Idol, ED    Imaging Review Dg Chest 2 View  02/24/2015  CLINICAL DATA:  Right-sided and central chest pain. Left-sided arm numbness and left leg swelling. Symptoms for 2 days. EXAM: CHEST  2 VIEW COMPARISON:  05/04/2014 FINDINGS: The heart size and mediastinal contours are within normal limits. Both lungs are clear. The visualized skeletal structures are unremarkable. IMPRESSION: No active cardiopulmonary disease. Electronically Signed   By: Lucienne Capers M.D.   On: 02/24/2015 00:39   Ct Angio Chest Pe W/cm &/or Wo Cm  02/24/2015  CLINICAL DATA:  Acute onset of intermittent mid chest pain and shortness of breath. Dizziness and mild nausea. Initial encounter. EXAM: CT ANGIOGRAPHY CHEST WITH CONTRAST TECHNIQUE: Multidetector CT imaging of the chest was performed using the standard protocol during bolus administration of intravenous contrast. Multiplanar CT image reconstructions and MIPs were obtained to evaluate the vascular anatomy. CONTRAST:  161mL OMNIPAQUE IOHEXOL 350 MG/ML SOLN COMPARISON:  Chest radiograph performed earlier today at 12:30 a.m., and CTA of the chest performed 12/09/2008 FINDINGS: There is no evidence of pulmonary embolus. Minimal bibasilar atelectasis is noted. The lungs are otherwise clear. There is no evidence of significant focal consolidation, pleural effusion or pneumothorax. No masses are identified; no abnormal focal contrast enhancement is seen. The mediastinum is unremarkable in appearance. No mediastinal lymphadenopathy is seen. No pericardial effusion is identified. The great vessels are grossly unremarkable. No axillary lymphadenopathy is seen. The visualized portions of the thyroid gland are unremarkable in appearance. The visualized  portions of the liver and spleen are unremarkable. The patient is status post cholecystectomy, with clips noted along the gallbladder fossa. The visualized portions of the pancreas, adrenal glands and kidneys are grossly unremarkable. No acute osseous abnormalities are seen. Review of the MIP images confirms the above findings. IMPRESSION: 1. No evidence of pulmonary embolus. 2. Minimal bibasilar atelectasis noted.  Lungs otherwise clear. Electronically Signed   By: Garald Balding M.D.   On: 02/24/2015 02:49    I personally reviewed and evaluated these images and lab results as a part of my medical decision-making.   EKG Interpretation  Date/Time:  Monday February 24 2015 00:08:22 EST Ventricular Rate:  96 PR Interval:  140 QRS Duration: 80 QT Interval:  398 QTC Calculation: 502 R Axis:   10 Text Interpretation:  Normal sinus rhythm Right atrial enlargement Prolonged QT Abnormal ECG No significant change since last tracing Confirmed by HORTON  MD, Gallina ([id number]) on 02/24/2015 12:16:29 AM    MDM   Final diagnoses:  Other chest pain   Patient presents with chest pain. Ongoing for the last several days. No exacerbating or alleviating factors. Has had a history of chronic pain in the past. Also reports left lower extremity swelling. Minimal swelling on exam. Lab work obtained including troponin and d-dimer. EKG shows no significant changes.  Troponin is negative. Dimer is minimally elevated at 0.51. Patient is on dialysis and is on the list for kidney transplantation. Will obtain CTA.  3:50 AM No improvement of pain with nitroglycerin. Pain improved with morphine. Now a 3 out of 10. CT scan is negative for PE. Discussed the patient with cardiology. Patient is hypertensive which is likely related for her need for dialysis later  today.  Discussed admission with serial enzymes and blood pressure control. Patient does not want to be admitted. Given pain is been ongoing for 3 days and negative  troponin, will repeat troponin at 4 hours. If she is discharged, she will need close follow-up with Dr. Burt Knack. Risk and benefits of admission versus discharge were discussed.  5:22 AM Repeat troponin is negative. Patient will follow-up very closely with cardiology. Proceed to dialysis as scheduled.  After history, exam, and medical workup I feel the patient has been appropriately medically screened and is safe for discharge home. Pertinent diagnoses were discussed with the patient. Patient was given return precautions.  I personally performed the services described in this documentation, which was scribed in my presence. The recorded information has been reviewed and is accurate.    Merryl Hacker, MD 02/24/15 [id number]

## 2015-02-24 NOTE — ED Notes (Signed)
Pt. reports intermittent mid chest pain with SOB , dizziness and mild nausea onset 2 days ago , denies cough or emesis . Hemodialysis q Mon/Wed/Fri.

## 2015-02-24 NOTE — Discharge Instructions (Signed)
You were seen today for chest pain. Her workup is reassuring. You need to follow-up very closely with her cardiologist. If you have any new or worsening symptoms she should be reevaluated immediately. You should proceed to dialysis today as scheduled.  Nonspecific Chest Pain  Chest pain can be caused by many different conditions. There is always a chance that your pain could be related to something serious, such as a heart attack or a blood clot in your lungs. Chest pain can also be caused by conditions that are not life-threatening. If you have chest pain, it is very important to follow up with your health care provider. CAUSES  Chest pain can be caused by:  Heartburn.  Pneumonia or bronchitis.  Anxiety or stress.  Inflammation around your heart (pericarditis) or lung (pleuritis or pleurisy).  A blood clot in your lung.  A collapsed lung (pneumothorax). It can develop suddenly on its own (spontaneous pneumothorax) or from trauma to the chest.  Shingles infection (varicella-zoster virus).  Heart attack.  Damage to the bones, muscles, and cartilage that make up your chest wall. This can include:  Bruised bones due to injury.  Strained muscles or cartilage due to frequent or repeated coughing or overwork.  Fracture to one or more ribs.  Sore cartilage due to inflammation (costochondritis). RISK FACTORS  Risk factors for chest pain may include:  Activities that increase your risk for trauma or injury to your chest.  Respiratory infections or conditions that cause frequent coughing.  Medical conditions or overeating that can cause heartburn.  Heart disease or family history of heart disease.  Conditions or health behaviors that increase your risk of developing a blood clot.  Having had chicken pox (varicella zoster). SIGNS AND SYMPTOMS Chest pain can feel like:  Burning or tingling on the surface of your chest or deep in your chest.  Crushing, pressure, aching, or  squeezing pain.  Dull or sharp pain that is worse when you move, cough, or take a deep breath.  Pain that is also felt in your back, neck, shoulder, or arm, or pain that spreads to any of these areas. Your chest pain may come and go, or it may stay constant. DIAGNOSIS Lab tests or other studies may be needed to find the cause of your pain. Your health care provider may have you take a test called an ambulatory ECG (electrocardiogram). An ECG records your heartbeat patterns at the time the test is performed. You may also have other tests, such as:  Transthoracic echocardiogram (TTE). During echocardiography, sound waves are used to create a picture of all of the heart structures and to look at how blood flows through your heart.  Transesophageal echocardiogram (TEE).This is a more advanced imaging test that obtains images from inside your body. It allows your health care provider to see your heart in finer detail.  Cardiac monitoring. This allows your health care provider to monitor your heart rate and rhythm in real time.  Holter monitor. This is a portable device that records your heartbeat and can help to diagnose abnormal heartbeats. It allows your health care provider to track your heart activity for several days, if needed.  Stress tests. These can be done through exercise or by taking medicine that makes your heart beat more quickly.  Blood tests.  Imaging tests. TREATMENT  Your treatment depends on what is causing your chest pain. Treatment may include:  Medicines. These may include:  Acid blockers for heartburn.  Anti-inflammatory medicine.  Pain medicine  for inflammatory conditions.  Antibiotic medicine, if an infection is present.  Medicines to dissolve blood clots.  Medicines to treat coronary artery disease.  Supportive care for conditions that do not require medicines. This may include:  Resting.  Applying heat or cold packs to injured areas.  Limiting  activities until pain decreases. HOME CARE INSTRUCTIONS  If you were prescribed an antibiotic medicine, finish it all even if you start to feel better.  Avoid any activities that bring on chest pain.  Do not use any tobacco products, including cigarettes, chewing tobacco, or electronic cigarettes. If you need help quitting, ask your health care provider.  Do not drink alcohol.  Take medicines only as directed by your health care provider.  Keep all follow-up visits as directed by your health care provider. This is important. This includes any further testing if your chest pain does not go away.  If heartburn is the cause for your chest pain, you may be told to keep your head raised (elevated) while sleeping. This reduces the chance that acid will go from your stomach into your esophagus.  Make lifestyle changes as directed by your health care provider. These may include:  Getting regular exercise. Ask your health care provider to suggest some activities that are safe for you.  Eating a heart-healthy diet. A registered dietitian can help you to learn healthy eating options.  Maintaining a healthy weight.  Managing diabetes, if necessary.  Reducing stress. SEEK MEDICAL CARE IF:  Your chest pain does not go away after treatment.  You have a rash with blisters on your chest.  You have a fever. SEEK IMMEDIATE MEDICAL CARE IF:   Your chest pain is worse.  You have an increasing cough, or you cough up blood.  You have severe abdominal pain.  You have severe weakness.  You faint.  You have chills.  You have sudden, unexplained chest discomfort.  You have sudden, unexplained discomfort in your arms, back, neck, or jaw.  You have shortness of breath at any time.  You suddenly start to sweat, or your skin gets clammy.  You feel nauseous or you vomit.  You suddenly feel light-headed or dizzy.  Your heart begins to beat quickly, or it feels like it is skipping  beats. These symptoms may represent a serious problem that is an emergency. Do not wait to see if the symptoms will go away. Get medical help right away. Call your local emergency services (911 in the [place] Do not drive yourself to the hospital.   This information is not intended to replace advice given to you by your health care provider. Make sure you discuss any questions you have with your health care provider.   Document Released: 12/30/2004 Document Revised: 04/12/2014 Document Reviewed: 10/26/2013 Elsevier Interactive Patient Education [employer].

## 2015-02-24 NOTE — ED Notes (Signed)
Pt returned to room A4 from CT

## 2015-03-26 ENCOUNTER — Encounter (HOSPITAL_COMMUNITY): Payer: Self-pay | Admitting: Emergency Medicine

## 2015-03-26 ENCOUNTER — Emergency Department (HOSPITAL_COMMUNITY)
Admission: EM | Admit: 2015-03-26 | Discharge: 2015-03-26 | Disposition: A | Discharge status: Home / Self Care | Payer: Medicare Other | Attending: Emergency Medicine | Admitting: Emergency Medicine

## 2015-03-26 DIAGNOSIS — G629 Polyneuropathy, unspecified: Secondary | ICD-10-CM | POA: Diagnosis not present

## 2015-03-26 DIAGNOSIS — Z794 Long term (current) use of insulin: Secondary | ICD-10-CM | POA: Diagnosis not present

## 2015-03-26 DIAGNOSIS — E211 Secondary hyperparathyroidism, not elsewhere classified: Secondary | ICD-10-CM | POA: Diagnosis not present

## 2015-03-26 DIAGNOSIS — Z862 Personal history of diseases of the blood and blood-forming organs and certain disorders involving the immune mechanism: Secondary | ICD-10-CM | POA: Diagnosis not present

## 2015-03-26 DIAGNOSIS — R1110 Vomiting, unspecified: Secondary | ICD-10-CM | POA: Insufficient documentation

## 2015-03-26 DIAGNOSIS — I509 Heart failure, unspecified: Secondary | ICD-10-CM | POA: Diagnosis not present

## 2015-03-26 DIAGNOSIS — I120 Hypertensive chronic kidney disease with stage 5 chronic kidney disease or end stage renal disease: Secondary | ICD-10-CM | POA: Diagnosis not present

## 2015-03-26 DIAGNOSIS — E785 Hyperlipidemia, unspecified: Secondary | ICD-10-CM | POA: Diagnosis not present

## 2015-03-26 DIAGNOSIS — E119 Type 2 diabetes mellitus without complications: Secondary | ICD-10-CM | POA: Diagnosis not present

## 2015-03-26 DIAGNOSIS — Z8739 Personal history of other diseases of the musculoskeletal system and connective tissue: Secondary | ICD-10-CM | POA: Insufficient documentation

## 2015-03-26 DIAGNOSIS — J45901 Unspecified asthma with (acute) exacerbation: Secondary | ICD-10-CM | POA: Diagnosis not present

## 2015-03-26 DIAGNOSIS — Z86018 Personal history of other benign neoplasm: Secondary | ICD-10-CM | POA: Diagnosis not present

## 2015-03-26 DIAGNOSIS — I25119 Atherosclerotic heart disease of native coronary artery with unspecified angina pectoris: Secondary | ICD-10-CM | POA: Insufficient documentation

## 2015-03-26 DIAGNOSIS — N186 End stage renal disease: Secondary | ICD-10-CM | POA: Insufficient documentation

## 2015-03-26 DIAGNOSIS — Z9889 Other specified postprocedural states: Secondary | ICD-10-CM | POA: Diagnosis not present

## 2015-03-26 DIAGNOSIS — G8929 Other chronic pain: Secondary | ICD-10-CM | POA: Insufficient documentation

## 2015-03-26 DIAGNOSIS — R05 Cough: Secondary | ICD-10-CM | POA: Diagnosis present

## 2015-03-26 DIAGNOSIS — K219 Gastro-esophageal reflux disease without esophagitis: Secondary | ICD-10-CM | POA: Diagnosis not present

## 2015-03-26 DIAGNOSIS — Z7982 Long term (current) use of aspirin: Secondary | ICD-10-CM | POA: Insufficient documentation

## 2015-03-26 DIAGNOSIS — Z79899 Other long term (current) drug therapy: Secondary | ICD-10-CM | POA: Diagnosis not present

## 2015-03-26 DIAGNOSIS — Z992 Dependence on renal dialysis: Secondary | ICD-10-CM | POA: Insufficient documentation

## 2015-03-26 MED ORDER — PREDNISONE 20 MG PO TABS: ORAL_TABLET | ORAL | Status: DC

## 2015-03-26 MED ORDER — IPRATROPIUM BROMIDE 0.02 % IN SOLN
0.5000 mg | Freq: Once | RESPIRATORY_TRACT | Status: AC
  Administered 2015-03-26: 0.5 mg via RESPIRATORY_TRACT
  Filled 2015-03-26: qty 2.5

## 2015-03-26 MED ORDER — PREDNISONE 20 MG PO TABS
60.0000 mg | ORAL_TABLET | Freq: Once | ORAL | Status: AC
  Administered 2015-03-26: 60 mg via ORAL
  Filled 2015-03-26: qty 3

## 2015-03-26 MED ORDER — FLUTICASONE PROPIONATE HFA 110 MCG/ACT IN AERO: 2.0000 | INHALATION_SPRAY | Freq: Two times a day (BID) | RESPIRATORY_TRACT | Status: DC

## 2015-03-26 MED ORDER — ALBUTEROL SULFATE (2.5 MG/3ML) 0.083% IN NEBU
5.0000 mg | INHALATION_SOLUTION | Freq: Once | RESPIRATORY_TRACT | Status: AC
  Administered 2015-03-26: 5 mg via RESPIRATORY_TRACT
  Filled 2015-03-26: qty 6

## 2015-03-26 NOTE — ED Provider Notes (Signed)
CSN: [id number]     Arrival date & time 03/26/15  0116 History  By signing my name below, I, Emmanuella Mensah, attest that this documentation has been prepared under the direction and in the presence of Rolland Porter, MD at 0201. Electronically Signed: Judithann Sauger, ED Scribe. 03/26/2015. 3:30 AM.    No chief complaint on file.  The history is provided by the patient. No language interpreter was used.   HPI Comments: Linda Ellison is a 39 y.o. female with a hx of asthma, CHF, HTN, and DM who presents to the Emergency Department complaining of gradually worsening productive cough with clear sputum onset 2 days ago. She reports associated chest tightness, wheezing, sore throat, post tussive vomiting, mild rhinorrhea with clear discharge, mild sneezing, and mild left ankle swelling. She denies any fever. She states that she used an albuterol inhaler PTA that provided short term relief. Pt received a breathing treatment here upon arrival but she states that her chest is still tight. She denies being admitted in the past for an asthma flare up. She adds that she normally receives steroids and her last flare up was in March 2016. She denies any tobacco or alcohol use. She reports that she is on disabilities due to her kidney failure and is currently on dialysis MWF at 6am.  Pt states this feels like her asthma, not like excess fluid and need for dialysis.  PCP: Dr. Jobe Igo [facility]   Past Medical History  Diagnosis Date  . Asthma   . ESRD on hemodialysis ([place])     started dialysis 01/29/11  . Weight loss   . Loss of appetite   . CHF (congestive heart failure) ([place])   . Hypertension     a. Also with hx of hypotension - has been started on midodrine.  . Hyperlipidemia   . Diabetes mellitus   . Peripheral neuropathy ([place])   . Iron deficiency anemia   . Hyperparathyroidism, secondary ([place])   . Sciatica   . GERD (gastroesophageal reflux disease)   . Adenoma   . Gastroparesis    . Esophageal erosions     a. Per EGD 10/2011.  Marland Kitchen CAD (coronary artery disease)     a. False positive stress echo 06/2012 at [facility] - cath with mild nonobstructive CAD at [facility] (mild luminal irregularities in LAD, moderate diffuse disease in distal RCA).  . History of echocardiogram     Echo 9/14: EF 55-60%, normal wall motion, normal diastolic function, PASP 21  . Chronic chest pain     a. H/o longstanding atypical CP. Nonobstructive cath 06/2012 and negative VQ.  Marland Kitchen ASCVD (arteriosclerotic cardiovascular disease)   . Anginal pain ([place])   . Peripheral vascular disease ([place])   . PONV (postoperative nausea and vomiting)    Past Surgical History  Procedure Laterality Date  . Cesarean section    . Cholecystectomy  1996  . Av fistula placement  08/2010    Left radiocephalic AVF  . Ligation goretex fistula  01/04/11    Left AVF  . Colonoscopy    . Esophagogastroduodenoscopy  10/19/2011    Dr. Silvano Rusk  . Foot surgery Left   . Tubal ligation    . Left heart catheterization with coronary angiogram N/A 06/27/2012    Procedure: LEFT HEART CATHETERIZATION WITH CORONARY ANGIOGRAM;  Surgeon: Larey Dresser, MD;  Location: [facility] CATH LAB;  Service: Cardiovascular;  Laterality: N/A;  . Fracture surgery Left     left femur  .  Dilation and curettage of uterus    . Av fistula placement Right 05/07/2014    Procedure: ARTERIOVENOUS (AV) FISTULA CREATION;  Surgeon: Elam Dutch, MD;  Location: [place];  Service: Vascular;  Laterality: Right;  . Av fistula placement Right 07/02/2014    Procedure: INSERTION OF ARTERIOVENOUS (AV) GORE-TEX GRAFT ARM;  Surgeon: Elam Dutch, MD;  [facility] OR;  Service: Vascular;  Laterality: Right;  . Thrombectomy and revision of arterioventous (av) goretex  graft Right 07/16/2014    Procedure: THROMBECTOMY  Right  arm  ARTERIOVENOUS  GORETEX  GRAFT;  Surgeon: Elam Dutch, MD;  Location: [place];  Service: Vascular;  Laterality: Right;  . Peripheral vascular  catheterization Left 08/08/2014    Procedure: Upper Extremity Angiography;  Surgeon: Algernon Huxley, MD;  Location: [place] CV LAB;  Service: Cardiovascular;  Laterality: Left;  . Peripheral vascular catheterization Left 08/08/2014    Procedure: Upper Extremity Intervention;  Surgeon: Algernon Huxley, MD;  Location: [place] CV LAB;  Service: Cardiovascular;  Laterality: Left;  . Ligation of arteriovenous  fistula Left 08/21/2014    Procedure: LIGATION OF ARTERIOVENOUS  FISTULA;  Surgeon: Algernon Huxley, MD;  Location: [facility] ORS;  Service: Vascular;  Laterality: Left;   Family History  Problem Relation Age of Onset  . Heart disease Mother     heart attack at 31 y/o-infected valve  . Kidney disease Mother     was a dialysis patient  . Heart disease Brother   . Kidney disease Maternal Grandmother     pre-dialysis  . Kidney failure Paternal Uncle   . Kidney failure Paternal Aunt   . Diabetes Father   . Hypertension Father   . Colon cancer Neg Hx   . Arthritis Brother   . Diabetes Maternal Aunt   . Hypertension Maternal Aunt   . Diabetes Paternal Aunt   . Heart disease Paternal Aunt   . Hypertension Paternal Aunt    Social History  Substance Use Topics  . Smoking status: Never Smoker   . Smokeless tobacco: Never Used  . Alcohol Use: No   On disability for renal failure  OB History    No data available     Review of Systems  Constitutional: Negative for fever.  HENT: Positive for rhinorrhea (mild) and sneezing (mild).   Respiratory: Positive for chest tightness and wheezing.   Gastrointestinal: Positive for vomiting (post tussive).  Musculoskeletal: Positive for joint swelling.  All other systems reviewed and are negative.     Allergies  Chlorhexidine gluconate; Ciprofloxacin; Fluocinolone; Pineapple; Strawberry extract; Adhesive; Clindamycin; Clindamycin/lincomycin; Oxycodone; and Shrimp  Home Medications   Prior to Admission medications   Medication Sig Start Date End  Date Taking? Authorizing Provider  albuterol (PROVENTIL HFA;VENTOLIN HFA) 108 (90 BASE) MCG/ACT inhaler Inhale 2 puffs into the lungs every 6 (six) hours as needed for wheezing or shortness of breath.    Historical Provider, MD  amLODipine (NORVASC) 10 MG tablet Take 10 mg by mouth at bedtime.  06/13/14   Historical Provider, MD  aspirin 81 MG tablet Take 81 mg by mouth daily.      Historical Provider, MD  Calcium Carb-Cholecalciferol (CALCIUM 600 + D PO) Take 1 tablet by mouth daily.    Historical Provider, MD  citalopram (CELEXA) 10 MG tablet Take 10 mg by mouth daily.    Historical Provider, MD  fluticasone (FLOVENT HFA) 110 MCG/ACT inhaler Inhale 2 puffs into the lungs 2 (two) times daily. 03/26/15  Rolland Porter, MD  gabapentin (NEURONTIN) 100 MG capsule Take 100-200 mg by mouth at bedtime. 100 mg in the am and 200 mg in evening    Historical Provider, MD  HYDROcodone-acetaminophen (NORCO) 5-325 MG per tablet Take 1 tablet by mouth every 6 (six) hours as needed for moderate pain. Patient not taking: Reported on 02/24/2015 08/21/14   Algernon Huxley, MD  hydrOXYzine (ATARAX/VISTARIL) 25 MG tablet Take 25 mg by mouth.    Historical Provider, MD  Insulin Aspart Prot & Aspart (NOVOLOG MIX 70/30 [place]) Inject 35 Units into the skin every evening. 35 units every morning and 45 units every evening    Historical Provider, MD  Insulin Detemir (LEVEMIR FLEXTOUCH) 100 UNIT/ML Pen Inject 40 Units into the skin every 12 (twelve) hours.  11/17/13   Historical Provider, MD  labetalol (NORMODYNE) 300 MG tablet Take 300 mg by mouth See admin instructions. On dialysis days (MWF) take 300mg  only at bedtime. All other days take twice daily.    Historical Provider, MD  linagliptin (TRADJENTA) 5 MG TABS tablet Take 5 mg by mouth at bedtime.    Historical Provider, MD  methimazole (TAPAZOLE) 5 MG tablet Take 5 mg by mouth daily.    Historical Provider, MD  multivitamin (RENA-VIT) TABS tablet Take 1 tablet by mouth daily.     Historical Provider, MD  omeprazole (PRILOSEC) 20 MG capsule TAKE 1 CAPSULE BY MOUTH EVERY DAY 01/23/15   Gatha Mayer, MD  ondansetron (ZOFRAN) 4 MG tablet Take 1 tablet (4 mg total) by mouth every 8 (eight) hours as needed for nausea or vomiting. 07/18/14   Elmyra Ricks Pisciotta, PA-C  predniSONE (DELTASONE) 20 MG tablet Take 3 po QD x 3d , then 2 po QD x 3d then 1 po QD x 3d 03/26/15   Rolland Porter, MD  sevelamer carbonate (RENVELA) 800 MG tablet Take 800-1,600 mg by mouth 3 (three) times daily with meals. 1600 mg with meals and 800 mg with snacks    Historical Provider, MD  simvastatin (ZOCOR) 5 MG tablet Take 5 mg by mouth at bedtime.    Historical Provider, MD  topiramate (TOPAMAX) 50 MG tablet Take 50 mg by mouth daily.     Historical Provider, MD  traMADol (ULTRAM) 50 MG tablet Take 1 tablet (50 mg total) by mouth every 6 (six) hours as needed. 02/09/15   Janne Napoleon, NP   BP 159/102 mmHg  Pulse 106  Temp(Src) 98.1 F (36.7 C) (Oral)  Resp 31  Ht 5\' 6"  (1.676 m)  Wt 209 lb (94.802 kg)  BMI 33.75 kg/m2  SpO2 100%  LMP 03/15/2015  Vital signs normal except for hypertension and tachycardia  Physical Exam  Constitutional: She is oriented to person, place, and time. She appears well-developed and well-nourished.  Non-toxic appearance. She does not appear ill. No distress.  HENT:  Head: Normocephalic and atraumatic.  Right Ear: External ear normal.  Left Ear: External ear normal.  Nose: Nose normal. No mucosal edema or rhinorrhea.  Mouth/Throat: Oropharynx is clear and moist and mucous membranes are normal. No dental abscesses or uvula swelling.  Eyes: Conjunctivae and EOM are normal. Pupils are equal, round, and reactive to light.  Neck: Normal range of motion and full passive range of motion without pain. Neck supple.  Cardiovascular: Normal rate, regular rhythm and normal heart sounds.  Exam reveals no gallop and no friction rub.   No murmur heard. Pulmonary/Chest: Effort normal and  breath sounds normal. No respiratory distress. She  has no wheezes. She has no rhonchi. She has no rales. She exhibits no tenderness and no crepitus.  Breath sounds diminished. She had already received an albuterol nebulizer by nursing staff and said she was feeling better but still felt tight.   Abdominal: Normal appearance.  Musculoskeletal: Normal range of motion. She exhibits no edema or tenderness.  Moves all extremities well.   Neurological: She is alert and oriented to person, place, and time. She has normal strength. No cranial nerve deficit.  Skin: Skin is warm, dry and intact. No rash noted. No erythema. No pallor.  Psychiatric: She has a normal mood and affect. Her speech is normal and behavior is normal. Her mood appears not anxious.  Nursing note and vitals reviewed.   ED Course  Procedures (including critical care time)  Medications  albuterol (PROVENTIL) (2.5 MG/3ML) 0.083% nebulizer solution 5 mg (5 mg Nebulization Given 03/26/15 0132)  albuterol (PROVENTIL) (2.5 MG/3ML) 0.083% nebulizer solution 5 mg (5 mg Nebulization Given 03/26/15 0220)  ipratropium (ATROVENT) nebulizer solution 0.5 mg (0.5 mg Nebulization Given 03/26/15 0220)  predniSONE (DELTASONE) tablet 60 mg (60 mg Oral Given 03/26/15 0220)    DIAGNOSTIC STUDIES: Oxygen Saturation is 100% on RA, normal by my interpretation.    COORDINATION OF CARE: 2:15 AM- Pt advised of plan for treatment and pt agrees. Pt will receive another breathing treatment and prednisone.   3:28 AM - Upon examination, pt had good air sounds, no wheezing. Pt has HTN but has dialysis in a few hours. She ran out of her flovent and will prescribe it for her. She just got an albuterol inhaler filled today.    EKG Interpretation   Date/Time:  Wednesday March 26 2015 01:24:20 EST Ventricular Rate:  98 PR Interval:  139 QRS Duration: 84 QT Interval:  384 QTC Calculation: 490 R Axis:   -8 Text Interpretation:  Sinus rhythm Borderline  prolonged QT interval No  significant change since last tracing 24 Feb 2015 Confirmed by [facility]   MD-I, Ortencia Askari ([id number]) on 03/26/2015 1:30:03 AM      MDM   Final diagnoses:  Asthma exacerbation    New Prescriptions   FLUTICASONE (FLOVENT HFA) 110 MCG/ACT INHALER    Inhale 2 puffs into the lungs 2 (two) times daily.   PREDNISONE (DELTASONE) 20 MG TABLET    Take 3 po QD x 3d , then 2 po QD x 3d then 1 po QD x 3d    Plan discharge  Rolland Porter, MD, FACEP   I personally performed the services described in this documentation, which was scribed in my presence. The recorded information has been reviewed and considered.  Rolland Porter, MD, Barbette Or, MD 03/26/15 [phone]

## 2015-03-26 NOTE — ED Notes (Signed)
Patient presents for asthma and chest tightness. Reports asthma flared up on Sunday, no relief with home inhaler. Chest tightness started Monday. Reports one episode of emesis and HA, denies visual changes. Rates pain 6/10.

## 2015-03-26 NOTE — ED Notes (Signed)
Bed: WA06 Expected date:  Expected time:  Means of arrival:  Comments: 

## 2015-03-26 NOTE — ED Notes (Signed)
MD at bedside. 

## 2015-03-26 NOTE — Discharge Instructions (Signed)
Go to your dialysis this morning. Continue using your inhaler, albuterol for wheezing and shortness of breath. Restart the flovent inhaler. Take the prednisone until gone. Recheck if you get worse such as fever, struggling to breathe.    Asthma Attack Prevention While you may not be able to control the fact that you have asthma, you can take actions to prevent asthma attacks. The best way to prevent asthma attacks is to maintain good control of your asthma. You can achieve this by:  Taking your medicines as directed.  Avoiding things that can irritate your airways or make your asthma symptoms worse (asthma triggers).  Keeping track of how well your asthma is controlled and of any changes in your symptoms.  Responding quickly to worsening asthma symptoms (asthma attack).  Seeking emergency care when it is needed. WHAT ARE SOME WAYS TO PREVENT AN ASTHMA ATTACK? Have a Plan Work with your health care provider to create a written plan for managing and treating your asthma attacks (asthma action plan). This plan includes:  A list of your asthma triggers and how you can avoid them.  Information on when medicines should be taken and when their dosages should be changed.  The use of a device that measures how well your lungs are working (peak flow meter). Monitor Your Asthma Use your peak flow meter and record your results in a journal every day. A drop in your peak flow numbers on one or more days may indicate the start of an asthma attack. This can happen even before you start to feel symptoms. You can prevent an asthma attack from getting worse by following the steps in your asthma action plan. Avoid Asthma Triggers Work with your asthma health care provider to find out what your asthma triggers are. This can be done by:  Allergy testing.  Keeping a journal that notes when asthma attacks occur and the factors that may have contributed to them.  Determining if there are other medical  conditions that are making your asthma worse. Once you have determined your asthma triggers, take steps to avoid them. This may include avoiding excessive or prolonged exposure to:  Dust. Have someone dust and vacuum your home for you once or twice a week. Using a high-efficiency particulate arrestance (HEPA) vacuum is best.  Smoke. This includes campfire smoke, forest fire smoke, and secondhand smoke from tobacco products.  Pet dander. Avoid contact with animals that you know you are allergic to.  Allergens from trees, grasses or pollens. Avoid spending a lot of time outdoors when pollen counts are high, and on very windy days.  Very cold, dry, or humid air.  Mold.  Foods that contain high amounts of sulfites.  Strong odors.  Outdoor air pollutants, such as Lexicographer.  Indoor air pollutants, such as aerosol sprays and fumes from household cleaners.  Household pests, including dust mites and cockroaches, and pest droppings.  Certain medicines, including NSAIDs. Always talk to your health care provider before stopping or starting any new medicines. Medicines Take over-the-counter and prescription medicines only as told by your health care provider. Many asthma attacks can be prevented by carefully following your medicine schedule. Taking your medicines correctly is especially important when you cannot avoid certain asthma triggers. Act Quickly If an asthma attack does happen, acting quickly can decrease how severe it is and how long it lasts. Take these steps:   Pay attention to your symptoms. If you are coughing, wheezing, or having difficulty breathing, do not wait  to see if your symptoms go away on their own. Follow your asthma action plan.  If you have followed your asthma action plan and your symptoms are not improving, call your health care provider or seek immediate medical care at the nearest hospital. It is important to note how often you need to use your fast-acting  rescue inhaler. If you are using your rescue inhaler more often, it may mean that your asthma is not under control. Adjusting your asthma treatment plan may help you to prevent future asthma attacks and help you to gain better control of your condition. HOW CAN I PREVENT AN ASTHMA ATTACK WHEN I EXERCISE? Follow advice from your health care provider about whether you should use your fast-acting inhaler before exercising. Many people with asthma experience exercise-induced bronchoconstriction (EIB). This condition often worsens during vigorous exercise in cold, humid, or dry environments. Usually, people with EIB can stay very active by pre-treating with a fast-acting inhaler before exercising.   This information is not intended to replace advice given to you by your health care provider. Make sure you discuss any questions you have with your health care provider.   Document Released: 03/10/2009 Document Revised: 12/11/2014 Document Reviewed: 08/22/2014 Elsevier Interactive Patient Education [employer].

## 2015-04-01 DIAGNOSIS — G8929 Other chronic pain: Secondary | ICD-10-CM | POA: Diagnosis present

## 2015-04-01 DIAGNOSIS — I2510 Atherosclerotic heart disease of native coronary artery without angina pectoris: Secondary | ICD-10-CM | POA: Insufficient documentation

## 2015-04-02 ENCOUNTER — Emergency Department (EMERGENCY_DEPARTMENT_HOSPITAL): Payer: Medicare Other

## 2015-04-02 ENCOUNTER — Encounter (HOSPITAL_COMMUNITY): Payer: Self-pay | Admitting: Emergency Medicine

## 2015-04-02 ENCOUNTER — Emergency Department (HOSPITAL_COMMUNITY)
Admission: EM | Admit: 2015-04-02 | Discharge: 2015-04-02 | Disposition: A | Discharge status: Home / Self Care | Payer: Medicare Other | Attending: Emergency Medicine | Admitting: Emergency Medicine

## 2015-04-02 DIAGNOSIS — I8002 Phlebitis and thrombophlebitis of superficial vessels of left lower extremity: Secondary | ICD-10-CM | POA: Diagnosis not present

## 2015-04-02 DIAGNOSIS — Z794 Long term (current) use of insulin: Secondary | ICD-10-CM | POA: Insufficient documentation

## 2015-04-02 DIAGNOSIS — J45909 Unspecified asthma, uncomplicated: Secondary | ICD-10-CM | POA: Diagnosis not present

## 2015-04-02 DIAGNOSIS — I809 Phlebitis and thrombophlebitis of unspecified site: Secondary | ICD-10-CM

## 2015-04-02 DIAGNOSIS — R609 Edema, unspecified: Secondary | ICD-10-CM | POA: Diagnosis not present

## 2015-04-02 DIAGNOSIS — N186 End stage renal disease: Secondary | ICD-10-CM | POA: Diagnosis not present

## 2015-04-02 DIAGNOSIS — E119 Type 2 diabetes mellitus without complications: Secondary | ICD-10-CM | POA: Diagnosis not present

## 2015-04-02 DIAGNOSIS — I509 Heart failure, unspecified: Secondary | ICD-10-CM | POA: Diagnosis not present

## 2015-04-02 DIAGNOSIS — Z79899 Other long term (current) drug therapy: Secondary | ICD-10-CM | POA: Diagnosis not present

## 2015-04-02 DIAGNOSIS — K219 Gastro-esophageal reflux disease without esophagitis: Secondary | ICD-10-CM | POA: Diagnosis not present

## 2015-04-02 DIAGNOSIS — Z7984 Long term (current) use of oral hypoglycemic drugs: Secondary | ICD-10-CM | POA: Diagnosis not present

## 2015-04-02 DIAGNOSIS — I120 Hypertensive chronic kidney disease with stage 5 chronic kidney disease or end stage renal disease: Secondary | ICD-10-CM | POA: Diagnosis not present

## 2015-04-02 DIAGNOSIS — M79605 Pain in left leg: Secondary | ICD-10-CM

## 2015-04-02 DIAGNOSIS — Z86018 Personal history of other benign neoplasm: Secondary | ICD-10-CM | POA: Diagnosis not present

## 2015-04-02 DIAGNOSIS — N2581 Secondary hyperparathyroidism of renal origin: Secondary | ICD-10-CM | POA: Insufficient documentation

## 2015-04-02 DIAGNOSIS — I25119 Atherosclerotic heart disease of native coronary artery with unspecified angina pectoris: Secondary | ICD-10-CM | POA: Diagnosis not present

## 2015-04-02 DIAGNOSIS — G8929 Other chronic pain: Secondary | ICD-10-CM | POA: Insufficient documentation

## 2015-04-02 DIAGNOSIS — Z7982 Long term (current) use of aspirin: Secondary | ICD-10-CM | POA: Diagnosis not present

## 2015-04-02 DIAGNOSIS — Z7951 Long term (current) use of inhaled steroids: Secondary | ICD-10-CM | POA: Insufficient documentation

## 2015-04-02 DIAGNOSIS — Z862 Personal history of diseases of the blood and blood-forming organs and certain disorders involving the immune mechanism: Secondary | ICD-10-CM | POA: Insufficient documentation

## 2015-04-02 DIAGNOSIS — Z992 Dependence on renal dialysis: Secondary | ICD-10-CM | POA: Diagnosis not present

## 2015-04-02 DIAGNOSIS — G629 Polyneuropathy, unspecified: Secondary | ICD-10-CM | POA: Insufficient documentation

## 2015-04-02 LAB — CBC WITH DIFFERENTIAL/PLATELET
Basophils Absolute: 0 10*3/uL (ref 0.0–0.1)
Basophils Relative: 0 %
Eosinophils Absolute: 0 10*3/uL (ref 0.0–0.7)
Eosinophils Relative: 0 %
HCT: 38 % (ref 36.0–46.0)
Hemoglobin: 12.2 g/dL (ref 12.0–15.0)
Lymphocytes Relative: 7 %
Lymphs Abs: 0.9 10*3/uL (ref 0.7–4.0)
MCH: 27.3 pg (ref 26.0–34.0)
MCHC: 32.1 g/dL (ref 30.0–36.0)
MCV: 85 fL (ref 78.0–100.0)
Monocytes Absolute: 0.2 10*3/uL (ref 0.1–1.0)
Monocytes Relative: 2 %
Neutro Abs: 11.2 10*3/uL — ABNORMAL HIGH (ref 1.7–7.7)
Neutrophils Relative %: 91 %
Platelets: 284 10*3/uL (ref 150–400)
RBC: 4.47 MIL/uL (ref 3.87–5.11)
RDW: 13.5 % (ref 11.5–15.5)
WBC: 12.3 10*3/uL — ABNORMAL HIGH (ref 4.0–10.5)

## 2015-04-02 LAB — COMPREHENSIVE METABOLIC PANEL
ALT: 19 U/L (ref 14–54)
AST: 17 U/L (ref 15–41)
Albumin: 3.3 g/dL — ABNORMAL LOW (ref 3.5–5.0)
Alkaline Phosphatase: 99 U/L (ref 38–126)
Anion gap: 12 (ref 5–15)
BUN: 35 mg/dL — ABNORMAL HIGH (ref 6–20)
CO2: 27 mmol/L (ref 22–32)
Calcium: 8.1 mg/dL — ABNORMAL LOW (ref 8.9–10.3)
Chloride: 94 mmol/L — ABNORMAL LOW (ref 101–111)
Creatinine, Ser: 4.77 mg/dL — ABNORMAL HIGH (ref 0.44–1.00)
GFR calc Af Amer: 12 mL/min — ABNORMAL LOW (ref >60)
GFR calc non Af Amer: 11 mL/min — ABNORMAL LOW (ref >60)
Glucose, Bld: 479 mg/dL — ABNORMAL HIGH (ref 65–99)
Potassium: 4 mmol/L (ref 3.5–5.1)
Sodium: 133 mmol/L — ABNORMAL LOW (ref 135–145)
Total Bilirubin: 0.4 mg/dL (ref 0.3–1.2)
Total Protein: 6.6 g/dL (ref 6.5–8.1)

## 2015-04-02 MED ORDER — HYDROCODONE-ACETAMINOPHEN 5-325 MG PO TABS
1.0000 | ORAL_TABLET | Freq: Once | ORAL | Status: AC
  Administered 2015-04-02: 1 via ORAL
  Filled 2015-04-02: qty 1

## 2015-04-02 NOTE — Progress Notes (Signed)
Preliminary results by tech - Left Lower Ext. Venous Duplex Completed. Negative for deep and superficial vein thrombosis in the left leg. Icis Budreau, BS, RDMS, RVT  

## 2015-04-02 NOTE — ED Notes (Signed)
Pt sent from dialysis denter with left leg swollen--started Saturday-- pain in calf, pain with walking.

## 2015-04-02 NOTE — ED Notes (Signed)
MD James at the bedside  

## 2015-04-02 NOTE — ED Provider Notes (Signed)
CSN: [id number]     Arrival date & time 04/02/15  1213 History   First MD Initiated Contact with Patient 04/02/15 1725     Chief Complaint  Patient presents with  . Leg Pain  . sent from dialysis       HPI  Patient presents evaluation of left leg pain? Swelling.  History of hemodialysis. Currently is noticed that she has some red dots on her left thigh and lower leg. States they're tender to touch. Seen in urgent care and sent here for ultrasound with "leg swelling". She does not have circumference when measured of her left leg versus the right.  No chest or difficulty breathing shortness of breath.  On her pain as dialysis schedule without misses.  Past Medical History  Diagnosis Date  . Asthma   . ESRD on hemodialysis ([place])     started dialysis 01/29/11  . Weight loss   . Loss of appetite   . CHF (congestive heart failure) ([place])   . Hypertension     a. Also with hx of hypotension - has been started on midodrine.  . Hyperlipidemia   . Diabetes mellitus   . Peripheral neuropathy ([place])   . Iron deficiency anemia   . Hyperparathyroidism, secondary ([place])   . Sciatica   . GERD (gastroesophageal reflux disease)   . Adenoma   . Gastroparesis   . Esophageal erosions     a. Per EGD 10/2011.  Marland Kitchen CAD (coronary artery disease)     a. False positive stress echo 06/2012 at [facility] - cath with mild nonobstructive CAD at [facility] (mild luminal irregularities in LAD, moderate diffuse disease in distal RCA).  . History of echocardiogram     Echo 9/14: EF 55-60%, normal wall motion, normal diastolic function, PASP 21  . Chronic chest pain     a. H/o longstanding atypical CP. Nonobstructive cath 06/2012 and negative VQ.  Marland Kitchen ASCVD (arteriosclerotic cardiovascular disease)   . Anginal pain ([place])   . Peripheral vascular disease ([place])   . PONV (postoperative nausea and vomiting)    Past Surgical History  Procedure Laterality Date  . Cesarean section    . Cholecystectomy  1996  . Av fistula  placement  08/2010    Left radiocephalic AVF  . Ligation goretex fistula  01/04/11    Left AVF  . Colonoscopy    . Esophagogastroduodenoscopy  10/19/2011    Dr. Silvano Rusk  . Foot surgery Left   . Tubal ligation    . Left heart catheterization with coronary angiogram N/A 06/27/2012    Procedure: LEFT HEART CATHETERIZATION WITH CORONARY ANGIOGRAM;  Surgeon: Larey Dresser, MD;  Location: [facility] CATH LAB;  Service: Cardiovascular;  Laterality: N/A;  . Fracture surgery Left     left femur  . Dilation and curettage of uterus    . Av fistula placement Right 05/07/2014    Procedure: ARTERIOVENOUS (AV) FISTULA CREATION;  Surgeon: Elam Dutch, MD;  Location: [place];  Service: Vascular;  Laterality: Right;  . Av fistula placement Right 07/02/2014    Procedure: INSERTION OF ARTERIOVENOUS (AV) GORE-TEX GRAFT ARM;  Surgeon: Elam Dutch, MD;  Location: [facility] OR;  Service: Vascular;  Laterality: Right;  . Thrombectomy and revision of arterioventous (av) goretex  graft Right 07/16/2014    Procedure: THROMBECTOMY  Right  arm  ARTERIOVENOUS  GORETEX  GRAFT;  Surgeon: Elam Dutch, MD;  Location: [place];  Service: Vascular;  Laterality: Right;  . Peripheral vascular catheterization Left  08/08/2014    Procedure: Upper Extremity Angiography;  Surgeon: Algernon Huxley, MD;  Location: [place] CV LAB;  Service: Cardiovascular;  Laterality: Left;  . Peripheral vascular catheterization Left 08/08/2014    Procedure: Upper Extremity Intervention;  Surgeon: Algernon Huxley, MD;  Location: [place] CV LAB;  Service: Cardiovascular;  Laterality: Left;  . Ligation of arteriovenous  fistula Left 08/21/2014    Procedure: LIGATION OF ARTERIOVENOUS  FISTULA;  Surgeon: Algernon Huxley, MD;  Location: [facility] ORS;  Service: Vascular;  Laterality: Left;   Family History  Problem Relation Age of Onset  . Heart disease Mother     heart attack at 8 y/o-infected valve  . Kidney disease Mother     was a dialysis patient  . Heart  disease Brother   . Kidney disease Maternal Grandmother     pre-dialysis  . Kidney failure Paternal Uncle   . Kidney failure Paternal Aunt   . Diabetes Father   . Hypertension Father   . Colon cancer Neg Hx   . Arthritis Brother   . Diabetes Maternal Aunt   . Hypertension Maternal Aunt   . Diabetes Paternal Aunt   . Heart disease Paternal Aunt   . Hypertension Paternal Aunt    Social History  Substance Use Topics  . Smoking status: Never Smoker   . Smokeless tobacco: Never Used  . Alcohol Use: No   OB History    No data available     Review of Systems  Constitutional: Negative for fever, chills, diaphoresis, appetite change and fatigue.  HENT: Negative for mouth sores, sore throat and trouble swallowing.   Eyes: Negative for visual disturbance.  Respiratory: Negative for cough, chest tightness, shortness of breath and wheezing.   Cardiovascular: Negative for chest pain.       Left leg pain  Gastrointestinal: Negative for nausea, vomiting, abdominal pain, diarrhea and abdominal distention.  Endocrine: Negative for polydipsia, polyphagia and polyuria.  Genitourinary: Negative for dysuria, frequency and hematuria.  Musculoskeletal: Negative for gait problem.  Skin: Negative for color change, pallor and rash.  Neurological: Negative for dizziness, syncope, light-headedness and headaches.  Hematological: Does not bruise/bleed easily.  Psychiatric/Behavioral: Negative for behavioral problems and confusion.      Allergies  Ciprofloxacin; Fluocinolone; Pineapple; Strawberry extract; Adhesive; Chlorhexidine gluconate; Clindamycin; Clindamycin/lincomycin; Oxycodone; and Shrimp  Home Medications   Prior to Admission medications   Medication Sig Start Date End Date Taking? Authorizing Provider  albuterol (PROVENTIL HFA;VENTOLIN HFA) 108 (90 BASE) MCG/ACT inhaler Inhale 2 puffs into the lungs every 6 (six) hours as needed for wheezing or shortness of breath.    Historical  Provider, MD  amLODipine (NORVASC) 10 MG tablet Take 10 mg by mouth at bedtime.  06/13/14   Historical Provider, MD  aspirin 81 MG tablet Take 81 mg by mouth daily.      Historical Provider, MD  Calcium Carb-Cholecalciferol (CALCIUM 600 + D PO) Take 1 tablet by mouth daily.    Historical Provider, MD  citalopram (CELEXA) 10 MG tablet Take 10 mg by mouth daily.    Historical Provider, MD  fluticasone (FLOVENT HFA) 110 MCG/ACT inhaler Inhale 2 puffs into the lungs 2 (two) times daily. 03/26/15   Rolland Porter, MD  gabapentin (NEURONTIN) 100 MG capsule Take 100-200 mg by mouth at bedtime. 100 mg in the am and 200 mg in evening    Historical Provider, MD  HYDROcodone-acetaminophen (NORCO) 5-325 MG per tablet Take 1 tablet by mouth every 6 (six)  hours as needed for moderate pain. Patient not taking: Reported on 02/24/2015 08/21/14   Algernon Huxley, MD  hydrOXYzine (ATARAX/VISTARIL) 25 MG tablet Take 25 mg by mouth.    Historical Provider, MD  Insulin Aspart Prot & Aspart (NOVOLOG MIX 70/30 [place]) Inject 35 Units into the skin every evening. 35 units every morning and 45 units every evening    Historical Provider, MD  Insulin Detemir (LEVEMIR FLEXTOUCH) 100 UNIT/ML Pen Inject 40 Units into the skin every 12 (twelve) hours.  11/17/13   Historical Provider, MD  labetalol (NORMODYNE) 300 MG tablet Take 300 mg by mouth See admin instructions. On dialysis days (MWF) take 300mg  only at bedtime. All other days take twice daily.    Historical Provider, MD  linagliptin (TRADJENTA) 5 MG TABS tablet Take 5 mg by mouth at bedtime.    Historical Provider, MD  methimazole (TAPAZOLE) 5 MG tablet Take 5 mg by mouth daily.    Historical Provider, MD  multivitamin (RENA-VIT) TABS tablet Take 1 tablet by mouth daily.    Historical Provider, MD  omeprazole (PRILOSEC) 20 MG capsule TAKE 1 CAPSULE BY MOUTH EVERY DAY 01/23/15   Gatha Mayer, MD  ondansetron (ZOFRAN) 4 MG tablet Take 1 tablet (4 mg total) by mouth every 8 (eight) hours as  needed for nausea or vomiting. 07/18/14   Elmyra Ricks Pisciotta, PA-C  predniSONE (DELTASONE) 20 MG tablet Take 3 po QD x 3d , then 2 po QD x 3d then 1 po QD x 3d 03/26/15   Rolland Porter, MD  sevelamer carbonate (RENVELA) 800 MG tablet Take 800-1,600 mg by mouth 3 (three) times daily with meals. 1600 mg with meals and 800 mg with snacks    Historical Provider, MD  simvastatin (ZOCOR) 5 MG tablet Take 5 mg by mouth at bedtime.    Historical Provider, MD  topiramate (TOPAMAX) 50 MG tablet Take 50 mg by mouth daily.     Historical Provider, MD  traMADol (ULTRAM) 50 MG tablet Take 1 tablet (50 mg total) by mouth every 6 (six) hours as needed. 02/09/15   Janne Napoleon, NP   BP 164/95 mmHg  Pulse 98  Temp(Src) 97.9 F (36.6 C) (Oral)  Resp 18  Ht 5\' 6"  (1.676 m)  Wt 209 lb (94.802 kg)  BMI 33.75 kg/m2  SpO2 99%  LMP 03/15/2015 Physical Exam  Constitutional: She is oriented to person, place, and time. She appears well-developed and well-nourished. No distress.  HENT:  Head: Normocephalic.  Eyes: Conjunctivae are normal. Pupils are equal, round, and reactive to light. No scleral icterus.  Neck: Normal range of motion. Neck supple. No thyromegaly present.  Cardiovascular: Normal rate and regular rhythm.  Exam reveals no gallop and no friction rub.   No murmur heard. Pulmonary/Chest: Effort normal and breath sounds normal. No respiratory distress. She has no wheezes. She has no rales.  Abdominal: Soft. Bowel sounds are normal. She exhibits no distension. There is no tenderness. There is no rebound.  Musculoskeletal: Normal range of motion.  Neurological: She is alert and oriented to person, place, and time.  Skin: Skin is warm and dry. No rash noted.     Psychiatric: She has a normal mood and affect. Her behavior is normal.    ED Course  Procedures (including critical care time) Labs Review Labs Reviewed  CBC WITH DIFFERENTIAL/PLATELET - Abnormal; Notable for the following:    WBC 12.3 (*)     Neutro Abs 11.2 (*)    All other  components within normal limits  COMPREHENSIVE METABOLIC PANEL - Abnormal; Notable for the following:    Sodium 133 (*)    Chloride 94 (*)    Glucose, Bld 479 (*)    BUN 35 (*)    Creatinine, Ser 4.77 (*)    Calcium 8.1 (*)    Albumin 3.3 (*)    GFR calc non Af Amer 11 (*)    GFR calc Af Amer 12 (*)    All other components within normal limits    Imaging Review No results found. I have personally reviewed and evaluated these images and lab results as part of my medical decision-making.   EKG Interpretation None      MDM   Final diagnoses:  Pain of left lower extremity  Superficial phlebitis    Doppler negative for acute DVT. Clinically has signs of multiple areas of telangiectasia and or superficial phlebitis. Circumference of the leg left versus right are symmetric above and below the knee. Well-perfused.  An heat, aspirin, support O's, primary care follow-up.    Tanna Furry, MD 04/02/15 [phone]

## 2015-04-02 NOTE — Discharge Instructions (Signed)
Aspirin 324mg  daily.  Apply heat.  Support stockings.  Heat Therapy Heat therapy can help ease sore, stiff, injured, and tight muscles and joints. Heat relaxes your muscles, which may help ease your pain. Heat therapy should only be used on old, pre-existing, or long-lasting (chronic) injuries. Do not use heat therapy unless told by your doctor. HOW TO USE HEAT THERAPY There are several different kinds of heat therapy, including:  Moist heat pack.  Warm water bath.  Hot water bottle.  Electric heating pad.  Heated gel pack.  Heated wrap.  Electric heating pad. GENERAL HEAT THERAPY RECOMMENDATIONS   Do not sleep while using heat therapy. Only use heat therapy while you are awake.  Your skin may turn pink while using heat therapy. Do not use heat therapy if your skin turns red.  Do not use heat therapy if you have new pain.  High heat or long exposure to heat can cause burns. Be careful when using heat therapy to avoid burning your skin.  Do not use heat therapy on areas of your skin that are already irritated, such as with a rash or sunburn. GET HELP IF:   You have blisters, redness, swelling (puffiness), or numbness.  You have new pain.  Your pain is worse. MAKE SURE YOU:  Understand these instructions.  Will watch your condition.  Will get help right away if you are not doing well or get worse.   This information is not intended to replace advice given to you by your health care provider. Make sure you discuss any questions you have with your health care provider.   Document Released: 06/14/2011 Document Revised: 04/12/2014 Document Reviewed: 05/15/2013 Elsevier Interactive Patient Education 2016 Elsevier Inc[employer]  Phlebitis Phlebitis is soreness and swelling (inflammation) of a vein. This can occur in your arms, legs, or torso (trunk), as well as deeper inside your body. Phlebitis is usually not serious when it occurs close to the surface of the body. However,  it can cause serious problems when it occurs in a vein deeper inside the body. CAUSES  Phlebitis can be triggered by various things, including:   Reduced blood flow through your veins. This can happen with:  Bed rest over a long period.  Long-distance travel.  Injury.  Surgery.  Being overweight (obese) or pregnant.  Having an IV tube put in the vein and getting certain medicines through the vein.  Cancer and cancer treatment.  Use of illegal drugs taken through the vein.  Inflammatory diseases.  Inherited (genetic) diseases that increase the risk of blood clots.  Hormone therapy, such as birth control pills. SIGNS AND SYMPTOMS   Red, tender, swollen, and painful area on your skin. Usually, the area will be long and narrow.  Firmness along the center of the affected area. This can indicate that a blood clot has formed.  Low-grade fever. DIAGNOSIS  A health care provider can usually diagnose phlebitis by examining the affected area and asking about your symptoms. To check for infection or blood clots, your health care provider may order blood tests or an ultrasound exam of the area. Blood tests and your family history may also indicate if you have an underlying genetic disease that causes blood clots. Occasionally, a piece of tissue is taken from the body (biopsy sample) if an unusual cause of phlebitis is suspected. TREATMENT  Treatment will vary depending on the severity of the condition and the area of the body affected. Treatment may include:  Use of a warm  compress or heating pad.  Use of compression stockings or bandages.  Anti-inflammatory medicines.  Removal of any IV tube that may be causing the problem.  Medicines that kill germs (antibiotics) if an infection is present.  Blood-thinning medicines if a blood clot is suspected or present.  In rare cases, surgery may be needed to remove damaged sections of vein. HOME CARE INSTRUCTIONS   Only take  over-the-counter or prescription medicines as directed by your health care provider. Take all medicines exactly as prescribed.  Raise (elevate) the affected area above the level of your heart as directed by your health care provider.  Apply a warm compress or heating pad to the affected area as directed by your health care provider. Do not sleep with the heating pad.  Use compression stockings or bandages as directed. These will speed healing and prevent the condition from coming back.  If you are on blood thinners:  Get follow-up blood tests as directed by your health care provider.  Check with your health care provider before using any new medicines.  Carry a medical alert card or wear your medical alert jewelry to show that you are on blood thinners.  For phlebitis in the legs:  Avoid prolonged standing or bed rest.  Keep your legs moving. Raise your legs when sitting or lying.  Do not smoke.  Women, particularly those over the age of 62, should consider the risks and benefits of taking the contraceptive pill. This kind of hormone treatment can increase your risk for blood clots.  Follow up with your health care provider as directed. SEEK MEDICAL CARE IF:   You have unusual bruising or any bleeding problems.  Your swelling or pain in the affected area is not improving.  You are on anti-inflammatory medicine, and you develop belly (abdominal) pain. SEEK IMMEDIATE MEDICAL CARE IF:   You have a sudden onset of chest pain or difficulty breathing.  You have a fever or persistent symptoms for more than 2-3 days.  You have a fever and your symptoms suddenly get worse. MAKE SURE YOU:  Understand these instructions.  Will watch your condition.  Will get help right away if you are not doing well or get worse.   This information is not intended to replace advice given to you by your health care provider. Make sure you discuss any questions you have with your health care  provider.   Document Released: 03/16/2001 Document Revised: 01/10/2013 Document Reviewed: 11/27/2012 Elsevier Interactive Patient Education [employer].

## 2015-05-02 ENCOUNTER — Emergency Department (HOSPITAL_COMMUNITY): Payer: Medicare Other

## 2015-05-02 ENCOUNTER — Emergency Department (HOSPITAL_COMMUNITY)
Admission: EM | Admit: 2015-05-02 | Discharge: 2015-05-02 | Disposition: A | Discharge status: Home / Self Care | Payer: Medicare Other | Attending: Emergency Medicine | Admitting: Emergency Medicine

## 2015-05-02 DIAGNOSIS — I25110 Atherosclerotic heart disease of native coronary artery with unstable angina pectoris: Secondary | ICD-10-CM | POA: Diagnosis not present

## 2015-05-02 DIAGNOSIS — Z7984 Long term (current) use of oral hypoglycemic drugs: Secondary | ICD-10-CM | POA: Diagnosis not present

## 2015-05-02 DIAGNOSIS — Y92410 Unspecified street and highway as the place of occurrence of the external cause: Secondary | ICD-10-CM | POA: Insufficient documentation

## 2015-05-02 DIAGNOSIS — I120 Hypertensive chronic kidney disease with stage 5 chronic kidney disease or end stage renal disease: Secondary | ICD-10-CM | POA: Insufficient documentation

## 2015-05-02 DIAGNOSIS — S199XXA Unspecified injury of neck, initial encounter: Secondary | ICD-10-CM | POA: Insufficient documentation

## 2015-05-02 DIAGNOSIS — K219 Gastro-esophageal reflux disease without esophagitis: Secondary | ICD-10-CM | POA: Insufficient documentation

## 2015-05-02 DIAGNOSIS — N186 End stage renal disease: Secondary | ICD-10-CM | POA: Diagnosis not present

## 2015-05-02 DIAGNOSIS — S0990XA Unspecified injury of head, initial encounter: Secondary | ICD-10-CM | POA: Insufficient documentation

## 2015-05-02 DIAGNOSIS — Z79899 Other long term (current) drug therapy: Secondary | ICD-10-CM | POA: Insufficient documentation

## 2015-05-02 DIAGNOSIS — E785 Hyperlipidemia, unspecified: Secondary | ICD-10-CM | POA: Diagnosis not present

## 2015-05-02 DIAGNOSIS — J45909 Unspecified asthma, uncomplicated: Secondary | ICD-10-CM | POA: Diagnosis not present

## 2015-05-02 DIAGNOSIS — Z7982 Long term (current) use of aspirin: Secondary | ICD-10-CM | POA: Insufficient documentation

## 2015-05-02 DIAGNOSIS — G629 Polyneuropathy, unspecified: Secondary | ICD-10-CM | POA: Diagnosis not present

## 2015-05-02 DIAGNOSIS — I509 Heart failure, unspecified: Secondary | ICD-10-CM | POA: Diagnosis not present

## 2015-05-02 DIAGNOSIS — Z794 Long term (current) use of insulin: Secondary | ICD-10-CM | POA: Insufficient documentation

## 2015-05-02 DIAGNOSIS — Z9889 Other specified postprocedural states: Secondary | ICD-10-CM | POA: Insufficient documentation

## 2015-05-02 DIAGNOSIS — G8929 Other chronic pain: Secondary | ICD-10-CM | POA: Diagnosis not present

## 2015-05-02 DIAGNOSIS — E162 Hypoglycemia, unspecified: Secondary | ICD-10-CM

## 2015-05-02 DIAGNOSIS — Z7951 Long term (current) use of inhaled steroids: Secondary | ICD-10-CM | POA: Diagnosis not present

## 2015-05-02 DIAGNOSIS — E11649 Type 2 diabetes mellitus with hypoglycemia without coma: Secondary | ICD-10-CM | POA: Diagnosis present

## 2015-05-02 DIAGNOSIS — Y998 Other external cause status: Secondary | ICD-10-CM | POA: Diagnosis not present

## 2015-05-02 DIAGNOSIS — Z992 Dependence on renal dialysis: Secondary | ICD-10-CM | POA: Diagnosis not present

## 2015-05-02 DIAGNOSIS — Z862 Personal history of diseases of the blood and blood-forming organs and certain disorders involving the immune mechanism: Secondary | ICD-10-CM | POA: Insufficient documentation

## 2015-05-02 DIAGNOSIS — Y9389 Activity, other specified: Secondary | ICD-10-CM | POA: Diagnosis not present

## 2015-05-02 LAB — CBC WITH DIFFERENTIAL/PLATELET
BASOS PCT: 0 %
Basophils Absolute: 0 10*3/uL (ref 0.0–0.1)
EOS ABS: 0 10*3/uL (ref 0.0–0.7)
Eosinophils Relative: 0 %
HEMATOCRIT: 42.3 % (ref 36.0–46.0)
HEMOGLOBIN: 13.4 g/dL (ref 12.0–15.0)
LYMPHS ABS: 1.1 10*3/uL (ref 0.7–4.0)
Lymphocytes Relative: 7 %
MCH: 27.3 pg (ref 26.0–34.0)
MCHC: 31.7 g/dL (ref 30.0–36.0)
MCV: 86.3 fL (ref 78.0–100.0)
MONO ABS: 0.7 10*3/uL (ref 0.1–1.0)
MONOS PCT: 5 %
NEUTROS PCT: 88 %
Neutro Abs: 14.5 10*3/uL — ABNORMAL HIGH (ref 1.7–7.7)
Platelets: 251 10*3/uL (ref 150–400)
RBC: 4.9 MIL/uL (ref 3.87–5.11)
RDW: 14.5 % (ref 11.5–15.5)
WBC: 16.3 10*3/uL — ABNORMAL HIGH (ref 4.0–10.5)

## 2015-05-02 LAB — TROPONIN I: Troponin I: <0.03 ng/mL (ref <0.031)

## 2015-05-02 LAB — COMPREHENSIVE METABOLIC PANEL
ALBUMIN: 3.3 g/dL — AB (ref 3.5–5.0)
ALK PHOS: 81 U/L (ref 38–126)
ALT: 19 U/L (ref 14–54)
AST: 20 U/L (ref 15–41)
Anion gap: 12 (ref 5–15)
BILIRUBIN TOTAL: 0.5 mg/dL (ref 0.3–1.2)
BUN: 32 mg/dL — AB (ref 6–20)
CALCIUM: 8.5 mg/dL — AB (ref 8.9–10.3)
CO2: 23 mmol/L (ref 22–32)
Chloride: 104 mmol/L (ref 101–111)
Creatinine, Ser: 6.95 mg/dL — ABNORMAL HIGH (ref 0.44–1.00)
GFR calc Af Amer: 8 mL/min — ABNORMAL LOW (ref >60)
GFR, EST NON AFRICAN AMERICAN: 7 mL/min — AB (ref >60)
GLUCOSE: 139 mg/dL — AB (ref 65–99)
POTASSIUM: 3.5 mmol/L (ref 3.5–5.1)
Sodium: 139 mmol/L (ref 135–145)
TOTAL PROTEIN: 6.8 g/dL (ref 6.5–8.1)

## 2015-05-02 LAB — URINALYSIS, ROUTINE W REFLEX MICROSCOPIC
Bilirubin Urine: NEGATIVE
GLUCOSE, UA: 250 mg/dL — AB
KETONES UR: NEGATIVE mg/dL
NITRITE: NEGATIVE
PH: 7.5 (ref 5.0–8.0)
Protein, ur: >300 mg/dL — AB
SPECIFIC GRAVITY, URINE: 1.009 (ref 1.005–1.030)

## 2015-05-02 LAB — CBG MONITORING, ED
GLUCOSE-CAPILLARY: 142 mg/dL — AB (ref 65–99)
GLUCOSE-CAPILLARY: 220 mg/dL — AB (ref 65–99)
GLUCOSE-CAPILLARY: 221 mg/dL — AB (ref 65–99)
Glucose-Capillary: 191 mg/dL — ABNORMAL HIGH (ref 65–99)

## 2015-05-02 LAB — URINE MICROSCOPIC-ADD ON

## 2015-05-02 MED ORDER — TRAMADOL HCL 50 MG PO TABS
100.0000 mg | ORAL_TABLET | Freq: Once | ORAL | Status: AC
  Administered 2015-05-02: 100 mg via ORAL
  Filled 2015-05-02: qty 2

## 2015-05-02 MED ORDER — TRAMADOL-ACETAMINOPHEN 37.5-325 MG PO TABS: 2.0000 | ORAL_TABLET | Freq: Four times a day (QID) | ORAL | Status: DC | PRN

## 2015-05-02 MED ORDER — ACETAMINOPHEN 500 MG PO TABS
1000.0000 mg | ORAL_TABLET | Freq: Once | ORAL | Status: AC
  Administered 2015-05-02: 1000 mg via ORAL
  Filled 2015-05-02: qty 2

## 2015-05-02 NOTE — ED Notes (Signed)
PT taken to CT at this time
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
## 2015-05-02 NOTE — ED Notes (Signed)
PT taken a meal

## 2015-05-02 NOTE — ED Notes (Signed)
PT and visitors express concern that she needs to dialyze today. EDP made aware of concern

## 2015-05-02 NOTE — ED Notes (Signed)
Pt very unsteady on feet, became dizzy when standing.

## 2015-05-02 NOTE — ED Notes (Addendum)
This nurse spoke to [place], nurse at [facility]. She reports that PT will not be able to be rescheduled for today. Dr. Colvin Caroli made aware.  Richrd Sox made aware that per ED physician, PT can dialyze tomorrow. Nurse reports that she will call PT to schedule for tomorrow.

## 2015-05-02 NOTE — ED Notes (Signed)
Pt was on her way to dialysis this am. States did not eat this morning and did not take insulin or tregenta this am.

## 2015-05-02 NOTE — ED Notes (Signed)
PT requests medicine for a headache. PT ate her entire sandwich and applesauce for lunch with juice
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
## 2015-05-02 NOTE — ED Notes (Signed)
PT instructed to not drive or operate machinery today or tomorrow. PT acknowledges. PT made aware that tramadol may make her drowsy

## 2015-05-02 NOTE — ED Notes (Signed)
MD at bedside. 
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
## 2015-05-02 NOTE — ED Notes (Signed)
  CBG 142

## 2015-05-02 NOTE — ED Notes (Signed)
PT's visitor taken a drink. PT has no requests at this time. PT is alert and oriented x4 and appears groggy. PT reports she is tired

## 2015-05-02 NOTE — ED Notes (Signed)
Pt given cranberry juice.

## 2015-05-04 LAB — URINE CULTURE

## 2015-05-05 ENCOUNTER — Telehealth (HOSPITAL_BASED_OUTPATIENT_CLINIC_OR_DEPARTMENT_OTHER): Payer: Self-pay | Admitting: Emergency Medicine

## 2015-05-05 NOTE — Progress Notes (Signed)
ED Antimicrobial Stewardship Positive Culture Follow Up   Linda Ellison is an 40 y.o. female who presented to [facility] on 05/02/2015 with a chief complaint of  Chief Complaint  Patient presents with  . Hypoglycemia    Recent Results (from the past 720 hour(s))  Urine culture     Status: None   Collection Time: 05/02/15 10:50 AM  Result Value Ref Range Status   Specimen Description URINE, RANDOM  Final   Special Requests NONE  Final   Culture   Final    >=100,000 COLONIES/mL GROUP B STREP(S.AGALACTIAE)ISOLATED TESTING AGAINST S. AGALACTIAE NOT ROUTINELY PERFORMED DUE TO PREDICTABILITY OF AMP/PEN/VAN SUSCEPTIBILITY.    Report Status 05/04/2015 FINAL  Final    No signs/symptoms of UTI. Asymptomatic bacteriuria--no treatment indicated.  ED Provider: Delrae Rend, PA-C  Jonna Munro, PharmD Candidate

## 2015-05-05 NOTE — Telephone Encounter (Signed)
Post ED Visit - Positive Culture Follow-up  Culture report reviewed by antimicrobial stewardship pharmacist:  []  Elenor Quinones, Pharm.D. [x]  Heide Guile, Pharm.D., BCPS []  Parks Neptune, Pharm.D. []  Alycia Rossetti, Pharm.D., BCPS []  Chambers, Pharm.D., BCPS, AAHIVP []  Legrand Como, Pharm.D., BCPS, AAHIVP []  Milus Glazier, Pharm.D. []  Stephens November, [place].D.  Positive urine culture Group B strep Treated with none, asymptomatic, no further patient follow-up is required at this time.  Hazle Nordmann 05/05/2015, 10:36 AM

## 2015-05-06 DIAGNOSIS — E0590 Thyrotoxicosis, unspecified without thyrotoxic crisis or storm: Secondary | ICD-10-CM | POA: Insufficient documentation

## 2015-05-06 DIAGNOSIS — N186 End stage renal disease: Secondary | ICD-10-CM | POA: Diagnosis present

## 2015-05-14 DIAGNOSIS — R9431 Abnormal electrocardiogram [ECG] [EKG]: Secondary | ICD-10-CM | POA: Insufficient documentation

## 2015-05-17 ENCOUNTER — Encounter (HOSPITAL_BASED_OUTPATIENT_CLINIC_OR_DEPARTMENT_OTHER): Payer: Self-pay | Admitting: Emergency Medicine

## 2015-05-17 ENCOUNTER — Emergency Department (HOSPITAL_BASED_OUTPATIENT_CLINIC_OR_DEPARTMENT_OTHER)
Admission: EM | Admit: 2015-05-17 | Discharge: 2015-05-17 | Disposition: A | Discharge status: Home / Self Care | Payer: Medicare Other | Attending: Emergency Medicine | Admitting: Emergency Medicine

## 2015-05-17 ENCOUNTER — Emergency Department (HOSPITAL_BASED_OUTPATIENT_CLINIC_OR_DEPARTMENT_OTHER): Payer: Medicare Other

## 2015-05-17 DIAGNOSIS — I120 Hypertensive chronic kidney disease with stage 5 chronic kidney disease or end stage renal disease: Secondary | ICD-10-CM | POA: Insufficient documentation

## 2015-05-17 DIAGNOSIS — K219 Gastro-esophageal reflux disease without esophagitis: Secondary | ICD-10-CM | POA: Diagnosis not present

## 2015-05-17 DIAGNOSIS — I25119 Atherosclerotic heart disease of native coronary artery with unspecified angina pectoris: Secondary | ICD-10-CM | POA: Diagnosis not present

## 2015-05-17 DIAGNOSIS — G629 Polyneuropathy, unspecified: Secondary | ICD-10-CM | POA: Diagnosis not present

## 2015-05-17 DIAGNOSIS — E119 Type 2 diabetes mellitus without complications: Secondary | ICD-10-CM | POA: Diagnosis not present

## 2015-05-17 DIAGNOSIS — Y990 Civilian activity done for income or pay: Secondary | ICD-10-CM | POA: Diagnosis not present

## 2015-05-17 DIAGNOSIS — Y92009 Unspecified place in unspecified non-institutional (private) residence as the place of occurrence of the external cause: Secondary | ICD-10-CM | POA: Diagnosis not present

## 2015-05-17 DIAGNOSIS — Z862 Personal history of diseases of the blood and blood-forming organs and certain disorders involving the immune mechanism: Secondary | ICD-10-CM | POA: Diagnosis not present

## 2015-05-17 DIAGNOSIS — Z7982 Long term (current) use of aspirin: Secondary | ICD-10-CM | POA: Diagnosis not present

## 2015-05-17 DIAGNOSIS — E785 Hyperlipidemia, unspecified: Secondary | ICD-10-CM | POA: Insufficient documentation

## 2015-05-17 DIAGNOSIS — S6992XA Unspecified injury of left wrist, hand and finger(s), initial encounter: Secondary | ICD-10-CM | POA: Insufficient documentation

## 2015-05-17 DIAGNOSIS — G8929 Other chronic pain: Secondary | ICD-10-CM | POA: Insufficient documentation

## 2015-05-17 DIAGNOSIS — M25532 Pain in left wrist: Secondary | ICD-10-CM

## 2015-05-17 DIAGNOSIS — Z992 Dependence on renal dialysis: Secondary | ICD-10-CM | POA: Insufficient documentation

## 2015-05-17 DIAGNOSIS — N186 End stage renal disease: Secondary | ICD-10-CM | POA: Diagnosis not present

## 2015-05-17 DIAGNOSIS — J45909 Unspecified asthma, uncomplicated: Secondary | ICD-10-CM | POA: Insufficient documentation

## 2015-05-17 DIAGNOSIS — Z9889 Other specified postprocedural states: Secondary | ICD-10-CM | POA: Diagnosis not present

## 2015-05-17 DIAGNOSIS — Z794 Long term (current) use of insulin: Secondary | ICD-10-CM | POA: Diagnosis not present

## 2015-05-17 DIAGNOSIS — Z7951 Long term (current) use of inhaled steroids: Secondary | ICD-10-CM | POA: Insufficient documentation

## 2015-05-17 DIAGNOSIS — Y9301 Activity, walking, marching and hiking: Secondary | ICD-10-CM | POA: Insufficient documentation

## 2015-05-17 DIAGNOSIS — I509 Heart failure, unspecified: Secondary | ICD-10-CM | POA: Insufficient documentation

## 2015-05-17 DIAGNOSIS — W010XXA Fall on same level from slipping, tripping and stumbling without subsequent striking against object, initial encounter: Secondary | ICD-10-CM | POA: Diagnosis not present

## 2015-05-17 NOTE — Discharge Instructions (Signed)
THE X-RAYS OF YOUR WRIST SHOWED A SMALL FRAGMENT OF BONE THAT MAY BE A SMALL "CHIP" IN YOUR RADIUS BONE; THIS INJURY MAY BE AN OLD INJURY BUT BECAUSE OF YOUR PAIN YOU WERE PROVIDED A WRIST SPLINT. WEAR THE SPLINT AT ALL TIMES EXCEPT DURING DIALYSIS AND WHILE BATHING. FOLLOW UP WITH YOUR PRIMARY CARE DOCTOR IN 1-2 WEEKS IF YOUR PAIN CONTINUES, FOR A REPEAT X-RAY.

## 2015-05-17 NOTE — ED Notes (Signed)
Left forearm wrist splint applied to left wrist. Pt tolerated well. No questions or complaints.

## 2015-05-17 NOTE — ED Provider Notes (Signed)
CSN: [id number]     Arrival date & time 05/17/15  0403 History   None    Chief Complaint  Patient presents with  . Arm Pain     (Consider location/radiation/quality/duration/timing/severity/associated sxs/prior Treatment) HPI Comments: 40yo F w/ extensive PMH including ESRD on HD, IDDM, CAD, CHF, PVD who p/w L arm pain. Just prior to arrival, the patient was walking in her living room where it was dark and she tripped, falling onto her left arm. She endorses constant, severe pain in her L wrist and dorsum of hand. Pain is worse w/ any movement of her wrist. She denies any other extremity pain. She did not hit her head or lose consciousness. Denies anticoagulant use.  Patient is a 40 y.o. female presenting with arm pain. The history is provided by the patient.  Arm Pain    Past Medical History  Diagnosis Date  . Asthma   . ESRD on hemodialysis ([place])     started dialysis 01/29/11  . Weight loss   . Loss of appetite   . CHF (congestive heart failure) ([place])   . Hypertension     a. Also with hx of hypotension - has been started on midodrine.  . Hyperlipidemia   . Diabetes mellitus   . Peripheral neuropathy ([place])   . Iron deficiency anemia   . Hyperparathyroidism, secondary ([place])   . Sciatica   . GERD (gastroesophageal reflux disease)   . Adenoma   . Gastroparesis   . Esophageal erosions     a. Per EGD 10/2011.  Marland Kitchen CAD (coronary artery disease)     a. False positive stress echo 06/2012 at [facility] - cath with mild nonobstructive CAD at [facility] (mild luminal irregularities in LAD, moderate diffuse disease in distal RCA).  . History of echocardiogram     Echo 9/14: EF 55-60%, normal wall motion, normal diastolic function, PASP 21  . Chronic chest pain     a. H/o longstanding atypical CP. Nonobstructive cath 06/2012 and negative VQ.  Marland Kitchen ASCVD (arteriosclerotic cardiovascular disease)   . Anginal pain ([place])   . Peripheral vascular disease ([place])   . PONV (postoperative nausea and vomiting)     Past Surgical History  Procedure Laterality Date  . Cesarean section    . Cholecystectomy  1996  . Av fistula placement  08/2010    Left radiocephalic AVF  . Ligation goretex fistula  01/04/11    Left AVF  . Colonoscopy    . Esophagogastroduodenoscopy  10/19/2011    Dr. Silvano Rusk  . Foot surgery Left   . Tubal ligation    . Left heart catheterization with coronary angiogram N/A 06/27/2012    Procedure: LEFT HEART CATHETERIZATION WITH CORONARY ANGIOGRAM;  Surgeon: Larey Dresser, MD;  Location[facility] CATH LAB;  Service: Cardiovascular;  Laterality: N/A;  . Fracture surgery Left     left femur  . Dilation and curettage of uterus    . Av fistula placement Right 05/07/2014    Procedure: ARTERIOVENOUS (AV) FISTULA CREATION;  Surgeon: Elam Dutch, MD;  Location: [facility];  Service: Vascular;  Laterality: Right;  . Av fistula placement Right 07/02/2014    Procedure: INSERTION OF ARTERIOVENOUS (AV) GORE-TEX GRAFT ARM;  Surgeon: Elam Dutch, MD;  Location: [facility] OR;  Service: Vascular;  Laterality: Right;  . Thrombectomy and revision of arterioventous (av) goretex  graft Right 07/16/2014    Procedure: THROMBECTOMY  Right  arm  ARTERIOVENOUS  GORETEX  GRAFT;  Surgeon: Elam Dutch,  MD;  Location: [facility] OR;  Service: Vascular;  Laterality: Right;  . Peripheral vascular catheterization Left 08/08/2014    Procedure: Upper Extremity Angiography;  Surgeon: Algernon Huxley, MD;  Location: [facility] LAB;  Service: Cardiovascular;  Laterality: Left;  . Peripheral vascular catheterization Left 08/08/2014    Procedure: Upper Extremity Intervention;  Surgeon: Algernon Huxley, MD;  Location: [facility] LAB;  Service: Cardiovascular;  Laterality: Left;  . Ligation of arteriovenous  fistula Left 08/21/2014    Procedure: LIGATION OF ARTERIOVENOUS  FISTULA;  Surgeon: Algernon Huxley, MD;  Location: [facility] ORS;  Service: Vascular;  Laterality: Left;   Family History  Problem Relation Age of Onset  . Heart  disease Mother     heart attack at 40 y/o-infected valve  . Kidney disease Mother     was a dialysis patient  . Heart disease Brother   . Kidney disease Maternal Grandmother     pre-dialysis  . Kidney failure Paternal Uncle   . Kidney failure Paternal Aunt   . Diabetes Father   . Hypertension Father   . Colon cancer Neg Hx   . Arthritis Brother   . Diabetes Maternal Aunt   . Hypertension Maternal Aunt   . Diabetes Paternal Aunt   . Heart disease Paternal Aunt   . Hypertension Paternal Aunt    Social History  Substance Use Topics  . Smoking status: Never Smoker   . Smokeless tobacco: Never Used  . Alcohol Use: No   OB History    No data available     Review of Systems 10 Systems reviewed and are negative for acute change except as noted in the HPI.    Allergies  Ciprofloxacin; Fluocinolone; Pineapple; Strawberry extract; Adhesive; Chlorhexidine gluconate; Clindamycin; Clindamycin/lincomycin; Oxycodone; and Shrimp  Home Medications   Prior to Admission medications   Medication Sig Start Date End Date Taking? Authorizing Provider  HYDROcodone-acetaminophen (NORCO) 5-325 MG per tablet Take 1 tablet by mouth every 6 (six) hours as needed for moderate pain. 08/21/14  Yes Algernon Huxley, MD  Insulin Aspart Prot & Aspart (NOVOLOG MIX 70/30 [place]) Inject 35-45 Units into the skin See admin instructions. 35 units every morning and 45 units every evening   Yes Historical Provider, MD  predniSONE (DELTASONE) 20 MG tablet Take 3 po QD x 3d , then 2 po QD x 3d then 1 po QD x 3d 03/26/15  Yes Rolland Porter, MD  traMADol (ULTRAM) 50 MG tablet Take 1 tablet (50 mg total) by mouth every 6 (six) hours as needed. Patient taking differently: Take 50 mg by mouth every 6 (six) hours as needed for moderate pain.  02/09/15  Yes Janne Napoleon, NP  albuterol (PROVENTIL HFA;VENTOLIN HFA) 108 (90 BASE) MCG/ACT inhaler Inhale 2 puffs into the lungs every 6 (six) hours as needed for wheezing or shortness of breath.     Historical Provider, MD  amLODipine (NORVASC) 10 MG tablet Take 10 mg by mouth at bedtime.  06/13/14   Historical Provider, MD  aspirin 81 MG tablet Take 81 mg by mouth daily.      Historical Provider, MD  Calcium Carb-Cholecalciferol (CALCIUM 600 + D PO) Take 1 tablet by mouth daily.    Historical Provider, MD  citalopram (CELEXA) 10 MG tablet Take 10 mg by mouth daily.    Historical Provider, MD  fluticasone (FLOVENT HFA) 110 MCG/ACT inhaler Inhale 2 puffs into the lungs 2 (two) times daily. 03/26/15   Rolland Porter, MD  gabapentin (NEURONTIN) 100 MG capsule Take 100-200 mg by mouth at bedtime. 100 mg in the am and 200 mg in evening    Historical Provider, MD  hydrOXYzine (ATARAX/VISTARIL) 25 MG tablet Take 25 mg by mouth every 4 (four) hours as needed for itching.     Historical Provider, MD  Insulin Detemir (LEVEMIR FLEXTOUCH) 100 UNIT/ML Pen Inject 40 Units into the skin every 12 (twelve) hours.  11/17/13   Historical Provider, MD  labetalol (NORMODYNE) 300 MG tablet Take 300 mg by mouth See admin instructions. On dialysis days (MWF) take 300mg  only at bedtime. All other days take twice daily.    Historical Provider, MD  linagliptin (TRADJENTA) 5 MG TABS tablet Take 5 mg by mouth at bedtime.    Historical Provider, MD  methimazole (TAPAZOLE) 5 MG tablet Take 5 mg by mouth daily.    Historical Provider, MD  multivitamin (RENA-VIT) TABS tablet Take 1 tablet by mouth daily.    Historical Provider, MD  omeprazole (PRILOSEC) 20 MG capsule TAKE 1 CAPSULE BY MOUTH EVERY DAY 01/23/15   Gatha Mayer, MD  ondansetron (ZOFRAN) 4 MG tablet Take 1 tablet (4 mg total) by mouth every 8 (eight) hours as needed for nausea or vomiting. 07/18/14   Elmyra Ricks Pisciotta, PA-C  sevelamer carbonate (RENVELA) 800 MG tablet Take 800-1,600 mg by mouth 3 (three) times daily with meals. 1600 mg with meals and 800 mg with snacks    Historical Provider, MD  simvastatin (ZOCOR) 5 MG tablet Take 5 mg by mouth at bedtime.     Historical Provider, MD  topiramate (TOPAMAX) 50 MG tablet Take 50 mg by mouth daily.     Historical Provider, MD  traMADol-acetaminophen (ULTRACET) 37.5-325 MG tablet Take 2 tablets by mouth every 6 (six) hours as needed. 05/02/15   Charlesetta Shanks, MD   BP 171/107 mmHg  Pulse 95  Temp(Src) 98.8 F (37.1 C) (Oral)  Resp 20  Ht 5\' 6"  (1.676 m)  Wt 213 lb (96.616 kg)  BMI 34.40 kg/m2  SpO2 100%  LMP 04/26/2015 Physical Exam  Constitutional: She is oriented to person, place, and time. She appears well-developed and well-nourished.  Tearful, holding L arm  HENT:  Head: Normocephalic and atraumatic.  Eyes: Conjunctivae are normal.  Cardiovascular: Intact distal pulses.   Musculoskeletal:       Arms: L forearm w/ AV fistula, palpable thrill; TTP L wrist and dorsum of L hand near 1st MCP joint w/ mild swelling, normal ROM at elbow but limited ROM at wrist 2/2 pain  Neurological: She is alert and oriented to person, place, and time.  Skin: Skin is warm and dry. No erythema.  Normal cap refill L fingers  Psychiatric: Judgment normal.  Distressed, tearful  Nursing note and vitals reviewed.   ED Course  Procedures (including critical care time) Labs Review Labs Reviewed - No data to display  Imaging Review Dg Forearm Left  05/17/2015  CLINICAL DATA:  Status post fall onto left arm, with left wrist pain. Initial encounter. EXAM: LEFT FOREARM - 2 VIEW COMPARISON:  None. FINDINGS: There is no evidence of fracture or dislocation. The radius and ulna appear grossly intact. Mild negative ulnar variance is noted. The carpal rows appear grossly intact, and demonstrate normal alignment. Scattered postoperative change is noted about the forearm. The elbow joint is grossly unremarkable in appearance. Volar soft tissue prominence reflects the patient's underlying AV fistula. IMPRESSION: No evidence of fracture or dislocation. Electronically Signed   By: Jacqulynn Cadet  Chang M.D.   On: 05/17/2015 04:48    Dg Wrist Complete Left  05/17/2015  CLINICAL DATA:  Status post fall on left arm at home. Diffuse left wrist pain. Initial encounter. EXAM: LEFT WRIST - COMPLETE 3+ VIEW COMPARISON:  None. FINDINGS: A tiny osseous fragment arising at the radial aspect of the distal radial metaphysis may reflect a small avulsion injury, or could reflect remote injury. The carpal rows are intact, and demonstrate normal alignment. The joint spaces are preserved. Postoperative change is noted about the wrist. IMPRESSION: Tiny osseous fragment at the radial aspect of the distal radial metaphysis may reflect a small avulsion injury, or could reflect remote injury. Electronically Signed   By: Garald Balding M.D.   On: 05/17/2015 04:50   Dg Hand Complete Left  05/17/2015  CLINICAL DATA:  Status post fall at home onto left arm, with diffuse left wrist pain. Initial encounter. EXAM: LEFT HAND - COMPLETE 3+ VIEW COMPARISON:  None. FINDINGS: A tiny osseous fragment at the radial aspect of the distal radial metaphysis may reflect a tiny avulsion injury, or remote injury. There is no additional evidence of fracture. The joint spaces are preserved. The carpal rows are intact, and demonstrate normal alignment. Mild postoperative change is noted at the wrist. IMPRESSION: Tiny osseous fragment at the radial aspect of the distal radial metaphysis may reflect a tiny avulsion injury, or remote injury. Electronically Signed   By: Garald Balding M.D.   On: 05/17/2015 04:51   I have personally reviewed and evaluated these images as part of my medical decision-making.    MDM   Final diagnoses:  Left wrist pain    Pt w/ L wrist and hand pain after falling onto arm. She was neurovascularly intact with no apparent injury to AV fistula, palpable thrill present. Tenderness of the distal radius and mild swelling and tenderness of dorsum of right hand near her thumb. Plain films show small bony fragment at distal radial metaphysis which may be  a small avulsion injury versus old injury. Because of the patient's pain, placed her in a wrist splint for immobilization, which can be removed for dialysis. Instructed on supportive care including ice and elevation. Instructed to follow up with PCP in one to 2 weeks for repeat films if ongoing pain. Patient voiced understanding was discharged in satisfactory condition.  Sharlett Iles, MD 05/17/15 [phone]

## 2015-05-17 NOTE — ED Notes (Signed)
Pt states she fell at home onto L arm. States pain from hand to elbow on LUE. Denies any other injury, or LOC. Skin intact, edema noted to L arm.

## 2015-06-06 ENCOUNTER — Other Ambulatory Visit: Payer: Self-pay | Admitting: Internal Medicine

## 2015-06-16 ENCOUNTER — Encounter (HOSPITAL_COMMUNITY): Payer: Self-pay | Admitting: Vascular Surgery

## 2015-06-16 DIAGNOSIS — Z86018 Personal history of other benign neoplasm: Secondary | ICD-10-CM | POA: Diagnosis not present

## 2015-06-16 DIAGNOSIS — E119 Type 2 diabetes mellitus without complications: Secondary | ICD-10-CM | POA: Insufficient documentation

## 2015-06-16 DIAGNOSIS — B349 Viral infection, unspecified: Secondary | ICD-10-CM | POA: Insufficient documentation

## 2015-06-16 DIAGNOSIS — Z9049 Acquired absence of other specified parts of digestive tract: Secondary | ICD-10-CM | POA: Insufficient documentation

## 2015-06-16 DIAGNOSIS — R600 Localized edema: Secondary | ICD-10-CM | POA: Insufficient documentation

## 2015-06-16 DIAGNOSIS — E663 Overweight: Secondary | ICD-10-CM | POA: Insufficient documentation

## 2015-06-16 DIAGNOSIS — Z7982 Long term (current) use of aspirin: Secondary | ICD-10-CM | POA: Insufficient documentation

## 2015-06-16 DIAGNOSIS — Z794 Long term (current) use of insulin: Secondary | ICD-10-CM | POA: Diagnosis not present

## 2015-06-16 DIAGNOSIS — J45901 Unspecified asthma with (acute) exacerbation: Secondary | ICD-10-CM | POA: Diagnosis not present

## 2015-06-16 DIAGNOSIS — R51 Headache: Secondary | ICD-10-CM | POA: Insufficient documentation

## 2015-06-16 DIAGNOSIS — Z992 Dependence on renal dialysis: Secondary | ICD-10-CM | POA: Insufficient documentation

## 2015-06-16 DIAGNOSIS — I509 Heart failure, unspecified: Secondary | ICD-10-CM | POA: Diagnosis not present

## 2015-06-16 DIAGNOSIS — Z862 Personal history of diseases of the blood and blood-forming organs and certain disorders involving the immune mechanism: Secondary | ICD-10-CM | POA: Insufficient documentation

## 2015-06-16 DIAGNOSIS — N186 End stage renal disease: Secondary | ICD-10-CM | POA: Insufficient documentation

## 2015-06-16 DIAGNOSIS — K219 Gastro-esophageal reflux disease without esophagitis: Secondary | ICD-10-CM | POA: Insufficient documentation

## 2015-06-16 DIAGNOSIS — E785 Hyperlipidemia, unspecified: Secondary | ICD-10-CM | POA: Diagnosis not present

## 2015-06-16 DIAGNOSIS — G8929 Other chronic pain: Secondary | ICD-10-CM | POA: Diagnosis not present

## 2015-06-16 DIAGNOSIS — R109 Unspecified abdominal pain: Secondary | ICD-10-CM | POA: Insufficient documentation

## 2015-06-16 DIAGNOSIS — G629 Polyneuropathy, unspecified: Secondary | ICD-10-CM | POA: Diagnosis not present

## 2015-06-16 DIAGNOSIS — Z79899 Other long term (current) drug therapy: Secondary | ICD-10-CM | POA: Diagnosis not present

## 2015-06-16 DIAGNOSIS — I25119 Atherosclerotic heart disease of native coronary artery with unspecified angina pectoris: Secondary | ICD-10-CM | POA: Insufficient documentation

## 2015-06-16 DIAGNOSIS — Z7951 Long term (current) use of inhaled steroids: Secondary | ICD-10-CM | POA: Insufficient documentation

## 2015-06-16 DIAGNOSIS — Z8739 Personal history of other diseases of the musculoskeletal system and connective tissue: Secondary | ICD-10-CM | POA: Insufficient documentation

## 2015-06-16 DIAGNOSIS — I120 Hypertensive chronic kidney disease with stage 5 chronic kidney disease or end stage renal disease: Secondary | ICD-10-CM | POA: Insufficient documentation

## 2015-06-16 LAB — COMPREHENSIVE METABOLIC PANEL
ALK PHOS: 79 U/L (ref 38–126)
ALT: 29 U/L (ref 14–54)
AST: 25 U/L (ref 15–41)
Albumin: 3.5 g/dL (ref 3.5–5.0)
Anion gap: 15 (ref 5–15)
BUN: 16 mg/dL (ref 6–20)
CALCIUM: 8.7 mg/dL — AB (ref 8.9–10.3)
CHLORIDE: 96 mmol/L — AB (ref 101–111)
CO2: 29 mmol/L (ref 22–32)
CREATININE: 5.51 mg/dL — AB (ref 0.44–1.00)
GFR calc Af Amer: 10 mL/min — ABNORMAL LOW (ref >60)
GFR, EST NON AFRICAN AMERICAN: 9 mL/min — AB (ref >60)
Glucose, Bld: 131 mg/dL — ABNORMAL HIGH (ref 65–99)
Potassium: 3.4 mmol/L — ABNORMAL LOW (ref 3.5–5.1)
SODIUM: 140 mmol/L (ref 135–145)
Total Bilirubin: 0.8 mg/dL (ref 0.3–1.2)
Total Protein: 7.1 g/dL (ref 6.5–8.1)

## 2015-06-16 LAB — CBC
HCT: 36 % (ref 36.0–46.0)
Hemoglobin: 11.5 g/dL — ABNORMAL LOW (ref 12.0–15.0)
MCH: 26.9 pg (ref 26.0–34.0)
MCHC: 31.9 g/dL (ref 30.0–36.0)
MCV: 84.1 fL (ref 78.0–100.0)
PLATELETS: 242 10*3/uL (ref 150–400)
RBC: 4.28 MIL/uL (ref 3.87–5.11)
RDW: 14.2 % (ref 11.5–15.5)
WBC: 5.9 10*3/uL (ref 4.0–10.5)

## 2015-06-16 LAB — I-STAT TROPONIN, ED: TROPONIN I, POC: 0.02 ng/mL (ref 0.00–0.08)

## 2015-06-16 NOTE — ED Notes (Signed)
Pt reports to the ED for eval of abd pain, N/V/D, and HAs x 2 days. Denies any hematemesis or blood in her stool. Pt also reports some chest discomfort as well. Denies any aggravating or relieving factors. Pt reports associated symptoms of lightheadedness. Last dialysis tx was today and had full tx done. Pt A&Ox4, resp e/u, and skin warm and dry

## 2015-06-17 ENCOUNTER — Emergency Department (HOSPITAL_COMMUNITY)
Admission: EM | Admit: 2015-06-17 | Discharge: 2015-06-17 | Disposition: A | Discharge status: Home / Self Care | Payer: Medicare Other | Attending: Emergency Medicine | Admitting: Emergency Medicine

## 2015-06-17 ENCOUNTER — Emergency Department (HOSPITAL_COMMUNITY): Payer: Medicare Other

## 2015-06-17 DIAGNOSIS — R1110 Vomiting, unspecified: Secondary | ICD-10-CM

## 2015-06-17 DIAGNOSIS — B349 Viral infection, unspecified: Secondary | ICD-10-CM

## 2015-06-17 DIAGNOSIS — R109 Unspecified abdominal pain: Secondary | ICD-10-CM | POA: Diagnosis not present

## 2015-06-17 DIAGNOSIS — R197 Diarrhea, unspecified: Secondary | ICD-10-CM

## 2015-06-17 MED ORDER — ONDANSETRON 4 MG PO TBDP: 4.0000 mg | ORAL_TABLET | Freq: Three times a day (TID) | ORAL | Status: DC | PRN

## 2015-06-17 MED ORDER — DIPHENHYDRAMINE HCL 50 MG/ML IJ SOLN
25.0000 mg | Freq: Once | INTRAMUSCULAR | Status: AC
  Administered 2015-06-17: 25 mg via INTRAVENOUS
  Filled 2015-06-17: qty 1

## 2015-06-17 MED ORDER — METOCLOPRAMIDE HCL 5 MG/ML IJ SOLN
10.0000 mg | Freq: Once | INTRAMUSCULAR | Status: AC
  Administered 2015-06-17: 10 mg via INTRAVENOUS
  Filled 2015-06-17: qty 2

## 2015-06-17 NOTE — ED Notes (Signed)
Pt verbalized understanding of d/c instructions and has no further questions. Pt states "I feel so much better now" Pt stable and NAD.

## 2015-06-17 NOTE — ED Provider Notes (Signed)
CSN: [id number]     Arrival date & time 06/16/15  1920 History  By signing my name below, I, [name], attest that this documentation has been prepared under the direction and in the presence of Merryl Hacker, MD. Electronically Signed: [name], ED Scribe. 06/17/2015. 7:37 AM   Chief Complaint  Patient presents with  . Abdominal Pain  . Headache    The history is provided by the patient. No language interpreter was used.   Linda Ellison is a 40 y.o. female with history of GERD, gastroparesis, DM, HTN, HLD, and ESRD on hemodialysis who presents to the Emergency Department complaining of sharp, 9/10 in severity, mid abdominal pain with onset 3 days ago. Associated symptoms include headache, cough, SOB, mild chest pain, nausea, NBNB emesis, and LE cramping . Pt denies LE swelling and hematochezia. She last had dialysis yesterday. No known sick contact.   Past Medical History  Diagnosis Date  . Asthma   . ESRD on hemodialysis ([place])     started dialysis 01/29/11  . Weight loss   . Loss of appetite   . CHF (congestive heart failure) ([place])   . Hypertension     a. Also with hx of hypotension - has been started on midodrine.  . Hyperlipidemia   . Diabetes mellitus   . Peripheral neuropathy ([place])   . Iron deficiency anemia   . Hyperparathyroidism, secondary ([place])   . Sciatica   . GERD (gastroesophageal reflux disease)   . Adenoma   . Gastroparesis   . Esophageal erosions     a. Per EGD 10/2011.  Marland Kitchen CAD (coronary artery disease)     a. False positive stress echo 06/2012 at [facility] - cath with mild nonobstructive CAD at [facility] (mild luminal irregularities in LAD, moderate diffuse disease in distal RCA).  . History of echocardiogram     Echo 9/14: EF 55-60%, normal wall motion, normal diastolic function, PASP 21  . Chronic chest pain     a. H/o longstanding atypical CP. Nonobstructive cath 06/2012 and negative VQ.  Marland Kitchen ASCVD (arteriosclerotic cardiovascular disease)   .  Anginal pain ([place])   . Peripheral vascular disease ([place])   . PONV (postoperative nausea and vomiting)    Past Surgical History  Procedure Laterality Date  . Cesarean section    . Cholecystectomy  1996  . Av fistula placement  08/2010    Left radiocephalic AVF  . Ligation goretex fistula  01/04/11    Left AVF  . Colonoscopy    . Esophagogastroduodenoscopy  10/19/2011    Dr. Silvano Rusk  . Foot surgery Left   . Tubal ligation    . Left heart catheterization with coronary angiogram N/A 06/27/2012    Procedure: LEFT HEART CATHETERIZATION WITH CORONARY ANGIOGRAM;  Surgeon: Larey Dresser, MD;  Location: [facility] CATH LAB;  Service: Cardiovascular;  Laterality: N/A;  . Fracture surgery Left     left femur  . Dilation and curettage of uterus    . Av fistula placement Right 05/07/2014    Procedure: ARTERIOVENOUS (AV) FISTULA CREATION;  Surgeon: Elam Dutch, MD;  Location: [place];  Service: Vascular;  Laterality: Right;  . Av fistula placement Right 07/02/2014    Procedure: INSERTION OF ARTERIOVENOUS (AV) GORE-TEX GRAFT ARM;  Surgeon: Elam Dutch, MD;  Location: [facility] OR;  Service: Vascular;  Laterality: Right;  . Thrombectomy and revision of arterioventous (av) goretex  graft Right 07/16/2014    Procedure: THROMBECTOMY  Right  arm  ARTERIOVENOUS  GORETEX  GRAFT;  Surgeon: Elam Dutch, MD;  Location: [place];  Service: Vascular;  Laterality: Right;  . Peripheral vascular catheterization Left 08/08/2014    Procedure: Upper Extremity Angiography;  Surgeon: Algernon Huxley, MD;  Location: [place] CV LAB;  Service: Cardiovascular;  Laterality: Left;  . Peripheral vascular catheterization Left 08/08/2014    Procedure: Upper Extremity Intervention;  Surgeon: Algernon Huxley, MD;  Location: [place] CV LAB;  Service: Cardiovascular;  Laterality: Left;  . Ligation of arteriovenous  fistula Left 08/21/2014    Procedure: LIGATION OF ARTERIOVENOUS  FISTULA;  Surgeon: Algernon Huxley, MD;  Location: [facility] ORS;   Service: Vascular;  Laterality: Left;   Family History  Problem Relation Age of Onset  . Heart disease Mother     heart attack at 61 y/o-infected valve  . Kidney disease Mother     was a dialysis patient  . Heart disease Brother   . Kidney disease Maternal Grandmother     pre-dialysis  . Kidney failure Paternal Uncle   . Kidney failure Paternal Aunt   . Diabetes Father   . Hypertension Father   . Colon cancer Neg Hx   . Arthritis Brother   . Diabetes Maternal Aunt   . Hypertension Maternal Aunt   . Diabetes Paternal Aunt   . Heart disease Paternal Aunt   . Hypertension Paternal Aunt    Social History  Substance Use Topics  . Smoking status: Never Smoker   . Smokeless tobacco: Never Used  . Alcohol Use: No   OB History    No data available     Review of Systems  Respiratory: Positive for cough and shortness of breath.   Cardiovascular: Positive for chest pain.  Gastrointestinal: Positive for nausea, vomiting and abdominal pain. Negative for blood in stool.  Neurological: Positive for headaches.  All other systems reviewed and are negative.   Allergies  Ciprofloxacin; Fluocinolone; Pineapple; Strawberry extract; Adhesive; Chlorhexidine gluconate; Clindamycin; Clindamycin/lincomycin; Oxycodone; and Shrimp  Home Medications   Prior to Admission medications   Medication Sig Start Date End Date Taking? Authorizing Provider  albuterol (PROVENTIL HFA;VENTOLIN HFA) 108 (90 BASE) MCG/ACT inhaler Inhale 2 puffs into the lungs every 6 (six) hours as needed for wheezing or shortness of breath.   Yes Historical Provider, MD  amLODipine (NORVASC) 10 MG tablet Take 10 mg by mouth at bedtime.  06/13/14  Yes Historical Provider, MD  aspirin 81 MG tablet Take 81 mg by mouth daily.     Yes Historical Provider, MD  Calcium Carb-Cholecalciferol (CALCIUM 600 + D PO) Take 1 tablet by mouth daily.   Yes Historical Provider, MD  citalopram (CELEXA) 10 MG tablet Take 10 mg by mouth daily.    Yes Historical Provider, MD  fluticasone (FLOVENT HFA) 110 MCG/ACT inhaler Inhale 2 puffs into the lungs 2 (two) times daily. 03/26/15  Yes Rolland Porter, MD  gabapentin (NEURONTIN) 100 MG capsule Take 100-200 mg by mouth at bedtime. 100 mg in the am and 200 mg in evening   Yes Historical Provider, MD  HYDROcodone-acetaminophen (NORCO) 5-325 MG per tablet Take 1 tablet by mouth every 6 (six) hours as needed for moderate pain. 08/21/14  Yes Algernon Huxley, MD  hydrOXYzine (ATARAX/VISTARIL) 25 MG tablet Take 25 mg by mouth every 4 (four) hours as needed for itching.    Yes Historical Provider, MD  Insulin Aspart Prot & Aspart (NOVOLOG MIX 70/30 [place]) Inject 35-45 Units into the skin See  admin instructions. 35 units every morning and 45 units every evening   Yes Historical Provider, MD  Insulin Detemir (LEVEMIR FLEXTOUCH) 100 UNIT/ML Pen Inject 22 Units into the skin every 12 (twelve) hours.  11/17/13  Yes Historical Provider, MD  labetalol (NORMODYNE) 300 MG tablet Take 300 mg by mouth See admin instructions. On dialysis days (MWF) take 300mg  only at bedtime. All other days take twice daily.   Yes Historical Provider, MD  linagliptin (TRADJENTA) 5 MG TABS tablet Take 5 mg by mouth at bedtime.   Yes Historical Provider, MD  methimazole (TAPAZOLE) 5 MG tablet Take 5 mg by mouth daily.   Yes Historical Provider, MD  multivitamin (RENA-VIT) TABS tablet Take 1 tablet by mouth daily.   Yes Historical Provider, MD  omeprazole (PRILOSEC) 20 MG capsule TAKE 1 CAPSULE BY MOUTH EVERY DAY 06/09/15  Yes Gatha Mayer, MD  ondansetron (ZOFRAN) 4 MG tablet Take 1 tablet (4 mg total) by mouth every 8 (eight) hours as needed for nausea or vomiting. 07/18/14  Yes Elmyra Ricks Pisciotta, PA-C  sevelamer carbonate (RENVELA) 800 MG tablet Take 800-1,600 mg by mouth 3 (three) times daily with meals. 1600 mg with meals and 800 mg with snacks   Yes Historical Provider, MD  simvastatin (ZOCOR) 5 MG tablet Take 5 mg by mouth at bedtime.   Yes  Historical Provider, MD  topiramate (TOPAMAX) 50 MG tablet Take 50 mg by mouth daily.    Yes Historical Provider, MD  traMADol (ULTRAM) 50 MG tablet Take 1 tablet (50 mg total) by mouth every 6 (six) hours as needed. Patient taking differently: Take 50 mg by mouth every 6 (six) hours as needed for moderate pain.  02/09/15  Yes Janne Napoleon, NP  traMADol-acetaminophen (ULTRACET) 37.5-325 MG tablet Take 2 tablets by mouth every 6 (six) hours as needed. 05/02/15  Yes Charlesetta Shanks, MD  ondansetron (ZOFRAN ODT) 4 MG disintegrating tablet Take 1 tablet (4 mg total) by mouth every 8 (eight) hours as needed for nausea or vomiting. 06/17/15   Merryl Hacker, MD  predniSONE (DELTASONE) 20 MG tablet Take 3 po QD x 3d , then 2 po QD x 3d then 1 po QD x 3d Patient not taking: Reported on 06/17/2015 03/26/15   Rolland Porter, MD   BP 166/100 mmHg  Pulse 88  Temp(Src) 98.5 F (36.9 C) (Oral)  Resp 16  Ht 5\' 6"  (1.676 m)  Wt 206 lb (93.441 kg)  BMI 33.27 kg/m2  SpO2 100% Physical Exam  Constitutional: She is oriented to person, place, and time. She appears well-developed and well-nourished.  Overweight  HENT:  Head: Normocephalic and atraumatic.  Cardiovascular: Normal rate, regular rhythm and normal heart sounds.   No murmur heard. Fistula left forearm positive thrill  Pulmonary/Chest: Effort normal and breath sounds normal. No respiratory distress. She has no wheezes. She exhibits tenderness.  Abdominal: Soft. Bowel sounds are normal. There is no tenderness. There is no rebound and no guarding.  Musculoskeletal:  Trace lower extremity edema  Neurological: She is alert and oriented to person, place, and time.  Skin: Skin is warm and dry.  Psychiatric: She has a normal mood and affect.  Nursing note and vitals reviewed.   ED Course  Procedures (including critical care time) DIAGNOSTIC STUDIES: Oxygen Saturation is 100% on RA,  normal by my interpretation.    COORDINATION OF CARE:   Labs  Review Labs Reviewed  CBC - Abnormal; Notable for the following:    Hemoglobin 11.5 (*)  All other components within normal limits  COMPREHENSIVE METABOLIC PANEL - Abnormal; Notable for the following:    Potassium 3.4 (*)    Chloride 96 (*)    Glucose, Bld 131 (*)    Creatinine, Ser 5.51 (*)    Calcium 8.7 (*)    GFR calc non Af Amer 9 (*)    GFR calc Af Amer 10 (*)    All other components within normal limits  I-STAT TROPOININ, ED  I-STAT TROPOININ, ED    Imaging Review Dg Abd Acute W/chest  06/17/2015  CLINICAL DATA:  Acute onset of nausea, vomiting and diarrhea. Mid abdominal pain. Initial encounter. EXAM: DG ABDOMEN ACUTE W/ 1V CHEST COMPARISON:  CTA of the chest performed 02/24/2015, and CT of the abdomen and pelvis from 12/24/2011 FINDINGS: The lungs are well-aerated and clear. There is no evidence of focal opacification, pleural effusion or pneumothorax. The cardiomediastinal silhouette is within normal limits. The visualized bowel gas pattern is unremarkable. Scattered stool and air are seen within the colon; there is no evidence of small bowel dilatation to suggest obstruction. No free intra-abdominal air is identified on the provided upright view. Clips are noted within the right upper quadrant, reflecting prior cholecystectomy. No acute osseous abnormalities are seen; the sacroiliac joints are unremarkable in appearance. IMPRESSION: 1. Unremarkable bowel gas pattern; no free intra-abdominal air seen. Small amount of stool noted in the colon. 2. No acute cardiopulmonary process seen. Electronically Signed   By: Garald Balding M.D.   On: 06/17/2015 02:01   I have personally reviewed and evaluated these images and lab results as part of my medical decision-making.   EKG Interpretation   Date/Time:  Monday June 16 2015 19:33:08 EDT Ventricular Rate:  90 PR Interval:  134 QRS Duration: 76 QT Interval:  438 QTC Calculation: 535 R Axis:   6 Text Interpretation:  Normal sinus  rhythm Biatrial enlargement Prolonged  QT Abnormal ECG No significant change since last tracing Confirmed by  HORTON  MD, COURTNEY ([id number]) on 06/17/2015 12:24:38 AM Also confirmed by  HORTON  MD, Barnhart ([id number]), editor [place]  CCT, BEVERLY ([id number])  on  06/17/2015 7:28:23 AM      MDM   Final diagnoses:  Abdominal pain, vomiting, and diarrhea  Viral syndrome    Patient presents with multiple complaints. Most consistent with viral etiology. She is nontoxic-appearing on exam. Afebrile. Dialyzed. She has both upper respiratory and GI symptoms. Workup is largely reassuring including CBC, CMP, troponin, and acute abdominal series. Patient was given Reglan and Benadryl. She is able to orally hydrate. She states that she feels much better. Fluids were withheld given that the patient is on dialysis. Will discharge home and have her follow-up closely with PCP. Zofran as needed.  After history, exam, and medical workup I feel the patient has been appropriately medically screened and is safe for discharge home. Pertinent diagnoses were discussed with the patient. Patient was given return precautions.  I personally performed the services described in this documentation, which was scribed in my presence. The recorded information has been reviewed and is accurate.    Merryl Hacker, MD 06/17/15 [phone]

## 2015-06-17 NOTE — Discharge Instructions (Signed)

## 2015-06-17 NOTE — ED Notes (Signed)
Pt given drink and crackers for po challenge.

## 2015-06-17 NOTE — ED Notes (Signed)
Pt able to tolerate po challenge well. MD notified.

## 2015-07-11 ENCOUNTER — Encounter (HOSPITAL_COMMUNITY): Payer: Self-pay

## 2015-07-11 ENCOUNTER — Inpatient Hospital Stay (HOSPITAL_COMMUNITY)
Admission: EM | Admit: 2015-07-11 | Discharge: 2015-07-14 | DRG: 291 | Disposition: A | Discharge status: Home / Self Care | Payer: Medicare Other | Attending: Internal Medicine | Admitting: Internal Medicine

## 2015-07-11 ENCOUNTER — Emergency Department (HOSPITAL_COMMUNITY): Payer: Medicare Other

## 2015-07-11 DIAGNOSIS — I2510 Atherosclerotic heart disease of native coronary artery without angina pectoris: Secondary | ICD-10-CM | POA: Diagnosis present

## 2015-07-11 DIAGNOSIS — E119 Type 2 diabetes mellitus without complications: Secondary | ICD-10-CM

## 2015-07-11 DIAGNOSIS — R52 Pain, unspecified: Secondary | ICD-10-CM

## 2015-07-11 DIAGNOSIS — R0602 Shortness of breath: Secondary | ICD-10-CM | POA: Diagnosis not present

## 2015-07-11 DIAGNOSIS — Z79899 Other long term (current) drug therapy: Secondary | ICD-10-CM

## 2015-07-11 DIAGNOSIS — K219 Gastro-esophageal reflux disease without esophagitis: Secondary | ICD-10-CM | POA: Diagnosis present

## 2015-07-11 DIAGNOSIS — Z9115 Patient's noncompliance with renal dialysis: Secondary | ICD-10-CM

## 2015-07-11 DIAGNOSIS — E785 Hyperlipidemia, unspecified: Secondary | ICD-10-CM | POA: Diagnosis present

## 2015-07-11 DIAGNOSIS — K3184 Gastroparesis: Secondary | ICD-10-CM | POA: Diagnosis present

## 2015-07-11 DIAGNOSIS — E1142 Type 2 diabetes mellitus with diabetic polyneuropathy: Secondary | ICD-10-CM | POA: Diagnosis present

## 2015-07-11 DIAGNOSIS — Z09 Encounter for follow-up examination after completed treatment for conditions other than malignant neoplasm: Secondary | ICD-10-CM

## 2015-07-11 DIAGNOSIS — G8929 Other chronic pain: Secondary | ICD-10-CM | POA: Diagnosis present

## 2015-07-11 DIAGNOSIS — Z833 Family history of diabetes mellitus: Secondary | ICD-10-CM

## 2015-07-11 DIAGNOSIS — I132 Hypertensive heart and chronic kidney disease with heart failure and with stage 5 chronic kidney disease, or end stage renal disease: Principal | ICD-10-CM | POA: Diagnosis present

## 2015-07-11 DIAGNOSIS — Z881 Allergy status to other antibiotic agents status: Secondary | ICD-10-CM

## 2015-07-11 DIAGNOSIS — M25562 Pain in left knee: Secondary | ICD-10-CM | POA: Diagnosis present

## 2015-07-11 DIAGNOSIS — N186 End stage renal disease: Secondary | ICD-10-CM | POA: Diagnosis present

## 2015-07-11 DIAGNOSIS — Z794 Long term (current) use of insulin: Secondary | ICD-10-CM

## 2015-07-11 DIAGNOSIS — M79662 Pain in left lower leg: Secondary | ICD-10-CM

## 2015-07-11 DIAGNOSIS — Z841 Family history of disorders of kidney and ureter: Secondary | ICD-10-CM

## 2015-07-11 DIAGNOSIS — Z7982 Long term (current) use of aspirin: Secondary | ICD-10-CM

## 2015-07-11 DIAGNOSIS — R109 Unspecified abdominal pain: Secondary | ICD-10-CM

## 2015-07-11 DIAGNOSIS — Z992 Dependence on renal dialysis: Secondary | ICD-10-CM

## 2015-07-11 DIAGNOSIS — E1122 Type 2 diabetes mellitus with diabetic chronic kidney disease: Secondary | ICD-10-CM | POA: Diagnosis present

## 2015-07-11 DIAGNOSIS — E1129 Type 2 diabetes mellitus with other diabetic kidney complication: Secondary | ICD-10-CM

## 2015-07-11 DIAGNOSIS — Z91018 Allergy to other foods: Secondary | ICD-10-CM

## 2015-07-11 DIAGNOSIS — Z885 Allergy status to narcotic agent status: Secondary | ICD-10-CM

## 2015-07-11 DIAGNOSIS — Z91013 Allergy to seafood: Secondary | ICD-10-CM

## 2015-07-11 DIAGNOSIS — I10 Essential (primary) hypertension: Secondary | ICD-10-CM | POA: Diagnosis present

## 2015-07-11 DIAGNOSIS — R079 Chest pain, unspecified: Secondary | ICD-10-CM | POA: Diagnosis present

## 2015-07-11 DIAGNOSIS — M79661 Pain in right lower leg: Secondary | ICD-10-CM

## 2015-07-11 DIAGNOSIS — J189 Pneumonia, unspecified organism: Secondary | ICD-10-CM

## 2015-07-11 DIAGNOSIS — Z8249 Family history of ischemic heart disease and other diseases of the circulatory system: Secondary | ICD-10-CM

## 2015-07-11 DIAGNOSIS — E1143 Type 2 diabetes mellitus with diabetic autonomic (poly)neuropathy: Secondary | ICD-10-CM | POA: Diagnosis present

## 2015-07-11 HISTORY — DX: Major depressive disorder, single episode, unspecified: F32.9

## 2015-07-11 HISTORY — DX: Depression, unspecified: F32.A

## 2015-07-11 LAB — CBG MONITORING, ED: Glucose-Capillary: 127 mg/dL — ABNORMAL HIGH (ref 65–99)

## 2015-07-11 NOTE — ED Notes (Signed)
Patient c/o centralized to left chest pain that radiates to the middle back that began yesterday evening.  Patient c/o of SOB, nausea, and dizziness.  Patient states that pain became worse today.  Nothing makes it better.  Denies recent illness.  Patient rates pain 8/10.

## 2015-07-12 ENCOUNTER — Inpatient Hospital Stay (HOSPITAL_COMMUNITY): Payer: Medicare Other

## 2015-07-12 ENCOUNTER — Emergency Department (HOSPITAL_COMMUNITY): Payer: Medicare Other

## 2015-07-12 ENCOUNTER — Encounter (HOSPITAL_COMMUNITY): Payer: Self-pay

## 2015-07-12 ENCOUNTER — Encounter (HOSPITAL_COMMUNITY): Payer: Self-pay | Admitting: Emergency Medicine

## 2015-07-12 DIAGNOSIS — Z91013 Allergy to seafood: Secondary | ICD-10-CM | POA: Diagnosis not present

## 2015-07-12 DIAGNOSIS — R079 Chest pain, unspecified: Secondary | ICD-10-CM | POA: Diagnosis present

## 2015-07-12 DIAGNOSIS — R1084 Generalized abdominal pain: Secondary | ICD-10-CM | POA: Diagnosis not present

## 2015-07-12 DIAGNOSIS — E8770 Fluid overload, unspecified: Secondary | ICD-10-CM | POA: Diagnosis not present

## 2015-07-12 DIAGNOSIS — Z885 Allergy status to narcotic agent status: Secondary | ICD-10-CM | POA: Diagnosis not present

## 2015-07-12 DIAGNOSIS — Z79899 Other long term (current) drug therapy: Secondary | ICD-10-CM | POA: Diagnosis not present

## 2015-07-12 DIAGNOSIS — E1143 Type 2 diabetes mellitus with diabetic autonomic (poly)neuropathy: Secondary | ICD-10-CM | POA: Diagnosis present

## 2015-07-12 DIAGNOSIS — R0602 Shortness of breath: Secondary | ICD-10-CM | POA: Diagnosis present

## 2015-07-12 DIAGNOSIS — K219 Gastro-esophageal reflux disease without esophagitis: Secondary | ICD-10-CM | POA: Diagnosis present

## 2015-07-12 DIAGNOSIS — K3184 Gastroparesis: Secondary | ICD-10-CM | POA: Diagnosis present

## 2015-07-12 DIAGNOSIS — M79662 Pain in left lower leg: Secondary | ICD-10-CM | POA: Diagnosis not present

## 2015-07-12 DIAGNOSIS — Z8249 Family history of ischemic heart disease and other diseases of the circulatory system: Secondary | ICD-10-CM | POA: Diagnosis not present

## 2015-07-12 DIAGNOSIS — Z992 Dependence on renal dialysis: Secondary | ICD-10-CM | POA: Diagnosis not present

## 2015-07-12 DIAGNOSIS — I10 Essential (primary) hypertension: Secondary | ICD-10-CM | POA: Diagnosis not present

## 2015-07-12 DIAGNOSIS — N186 End stage renal disease: Secondary | ICD-10-CM | POA: Diagnosis not present

## 2015-07-12 DIAGNOSIS — Z7982 Long term (current) use of aspirin: Secondary | ICD-10-CM | POA: Diagnosis not present

## 2015-07-12 DIAGNOSIS — I132 Hypertensive heart and chronic kidney disease with heart failure and with stage 5 chronic kidney disease, or end stage renal disease: Secondary | ICD-10-CM | POA: Diagnosis present

## 2015-07-12 DIAGNOSIS — Z794 Long term (current) use of insulin: Secondary | ICD-10-CM | POA: Diagnosis not present

## 2015-07-12 DIAGNOSIS — E1142 Type 2 diabetes mellitus with diabetic polyneuropathy: Secondary | ICD-10-CM | POA: Diagnosis present

## 2015-07-12 DIAGNOSIS — E1122 Type 2 diabetes mellitus with diabetic chronic kidney disease: Secondary | ICD-10-CM | POA: Diagnosis present

## 2015-07-12 DIAGNOSIS — J189 Pneumonia, unspecified organism: Secondary | ICD-10-CM

## 2015-07-12 DIAGNOSIS — I2510 Atherosclerotic heart disease of native coronary artery without angina pectoris: Secondary | ICD-10-CM | POA: Diagnosis present

## 2015-07-12 DIAGNOSIS — M79661 Pain in right lower leg: Secondary | ICD-10-CM | POA: Insufficient documentation

## 2015-07-12 DIAGNOSIS — M25562 Pain in left knee: Secondary | ICD-10-CM | POA: Diagnosis present

## 2015-07-12 DIAGNOSIS — Z833 Family history of diabetes mellitus: Secondary | ICD-10-CM | POA: Diagnosis not present

## 2015-07-12 DIAGNOSIS — Z91018 Allergy to other foods: Secondary | ICD-10-CM | POA: Diagnosis not present

## 2015-07-12 DIAGNOSIS — E785 Hyperlipidemia, unspecified: Secondary | ICD-10-CM | POA: Diagnosis present

## 2015-07-12 DIAGNOSIS — Z9115 Patient's noncompliance with renal dialysis: Secondary | ICD-10-CM | POA: Diagnosis not present

## 2015-07-12 DIAGNOSIS — Z841 Family history of disorders of kidney and ureter: Secondary | ICD-10-CM | POA: Diagnosis not present

## 2015-07-12 DIAGNOSIS — Z881 Allergy status to other antibiotic agents status: Secondary | ICD-10-CM | POA: Diagnosis not present

## 2015-07-12 DIAGNOSIS — R109 Unspecified abdominal pain: Secondary | ICD-10-CM | POA: Diagnosis present

## 2015-07-12 LAB — COMPREHENSIVE METABOLIC PANEL
ALBUMIN: 3.3 g/dL — AB (ref 3.5–5.0)
ALT: 12 U/L — ABNORMAL LOW (ref 14–54)
ANION GAP: 15 (ref 5–15)
AST: 12 U/L — ABNORMAL LOW (ref 15–41)
Alkaline Phosphatase: 62 U/L (ref 38–126)
BILIRUBIN TOTAL: 0.2 mg/dL — AB (ref 0.3–1.2)
BUN: 61 mg/dL — ABNORMAL HIGH (ref 6–20)
CO2: 16 mmol/L — AB (ref 22–32)
Calcium: 8.7 mg/dL — ABNORMAL LOW (ref 8.9–10.3)
Chloride: 109 mmol/L (ref 101–111)
Creatinine, Ser: 11.89 mg/dL — ABNORMAL HIGH (ref 0.44–1.00)
GFR calc Af Amer: 4 mL/min — ABNORMAL LOW (ref >60)
GFR calc non Af Amer: 4 mL/min — ABNORMAL LOW (ref >60)
GLUCOSE: 131 mg/dL — AB (ref 65–99)
POTASSIUM: 4.1 mmol/L (ref 3.5–5.1)
SODIUM: 140 mmol/L (ref 135–145)
TOTAL PROTEIN: 6.7 g/dL (ref 6.5–8.1)

## 2015-07-12 LAB — CBC WITH DIFFERENTIAL/PLATELET
BASOS PCT: 0 %
Basophils Absolute: 0 10*3/uL (ref 0.0–0.1)
EOS ABS: 0.1 10*3/uL (ref 0.0–0.7)
Eosinophils Relative: 1 %
HCT: 32.4 % — ABNORMAL LOW (ref 36.0–46.0)
Hemoglobin: 10.6 g/dL — ABNORMAL LOW (ref 12.0–15.0)
Lymphocytes Relative: 23 %
Lymphs Abs: 1.7 10*3/uL (ref 0.7–4.0)
MCH: 27.5 pg (ref 26.0–34.0)
MCHC: 32.7 g/dL (ref 30.0–36.0)
MCV: 83.9 fL (ref 78.0–100.0)
MONO ABS: 0.6 10*3/uL (ref 0.1–1.0)
MONOS PCT: 8 %
Neutro Abs: 5.1 10*3/uL (ref 1.7–7.7)
Neutrophils Relative %: 68 %
Platelets: 246 10*3/uL (ref 150–400)
RBC: 3.86 MIL/uL — ABNORMAL LOW (ref 3.87–5.11)
RDW: 14.9 % (ref 11.5–15.5)
WBC: 7.5 10*3/uL (ref 4.0–10.5)

## 2015-07-12 LAB — LIPASE, BLOOD: Lipase: 45 U/L (ref 11–51)

## 2015-07-12 LAB — PROCALCITONIN: PROCALCITONIN: 0.11 ng/mL

## 2015-07-12 LAB — INFLUENZA PANEL BY PCR (TYPE A & B)
H1N1FLUPCR: NOT DETECTED
INFLBPCR: NEGATIVE
Influenza A By PCR: NEGATIVE

## 2015-07-12 LAB — LACTIC ACID, PLASMA: LACTIC ACID, VENOUS: 0.8 mmol/L (ref 0.5–2.0)

## 2015-07-12 LAB — GLUCOSE, CAPILLARY
Glucose-Capillary: 112 mg/dL — ABNORMAL HIGH (ref 65–99)
Glucose-Capillary: 204 mg/dL — ABNORMAL HIGH (ref 65–99)

## 2015-07-12 LAB — TROPONIN I

## 2015-07-12 LAB — PROTIME-INR
INR: 1.22 (ref 0.00–1.49)
PROTHROMBIN TIME: 15.1 s (ref 11.6–15.2)

## 2015-07-12 LAB — HIV ANTIBODY (ROUTINE TESTING W REFLEX): HIV Screen 4th Generation wRfx: NONREACTIVE

## 2015-07-12 LAB — I-STAT BETA HCG BLOOD, ED (MC, WL, AP ONLY)

## 2015-07-12 LAB — APTT: aPTT: 30 seconds (ref 24–37)

## 2015-07-12 LAB — I-STAT TROPONIN, ED: TROPONIN I, POC: 0.02 ng/mL (ref 0.00–0.08)

## 2015-07-12 MED ORDER — HEPARIN SODIUM (PORCINE) 1000 UNIT/ML DIALYSIS
1000.0000 [IU] | INTRAMUSCULAR | Status: DC | PRN
  Filled 2015-07-12: qty 1

## 2015-07-12 MED ORDER — ALBUTEROL SULFATE (2.5 MG/3ML) 0.083% IN NEBU
5.0000 mg | INHALATION_SOLUTION | Freq: Once | RESPIRATORY_TRACT | Status: AC
  Administered 2015-07-12: 5 mg via RESPIRATORY_TRACT
  Filled 2015-07-12: qty 6

## 2015-07-12 MED ORDER — HYDROXYZINE HCL 25 MG PO TABS
25.0000 mg | ORAL_TABLET | ORAL | Status: DC | PRN
  Administered 2015-07-12: 25 mg via ORAL
  Filled 2015-07-12: qty 1

## 2015-07-12 MED ORDER — FLUTICASONE PROPIONATE HFA 110 MCG/ACT IN AERO: 2.0000 | INHALATION_SPRAY | Freq: Two times a day (BID) | RESPIRATORY_TRACT | Status: DC

## 2015-07-12 MED ORDER — CALCIUM CARBONATE-VITAMIN D 500-200 MG-UNIT PO TABS
1.0000 | ORAL_TABLET | Freq: Every day | ORAL | Status: DC
  Administered 2015-07-12 – 2015-07-14 (×3): 1 via ORAL
  Filled 2015-07-12 (×3): qty 1

## 2015-07-12 MED ORDER — DOXERCALCIFEROL 4 MCG/2ML IV SOLN
INTRAVENOUS | Status: AC
  Administered 2015-07-12: 10 ug via INTRAVENOUS
  Filled 2015-07-12: qty 6

## 2015-07-12 MED ORDER — INSULIN DETEMIR 100 UNIT/ML ~~LOC~~ SOLN
15.0000 [IU] | Freq: Two times a day (BID) | SUBCUTANEOUS | Status: DC
  Administered 2015-07-12 – 2015-07-14 (×5): 15 [IU] via SUBCUTANEOUS
  Filled 2015-07-12 (×6): qty 0.15

## 2015-07-12 MED ORDER — LIDOCAINE-PRILOCAINE 2.5-2.5 % EX CREA
1.0000 "application " | TOPICAL_CREAM | CUTANEOUS | Status: DC | PRN
  Filled 2015-07-12: qty 5

## 2015-07-12 MED ORDER — INSULIN ASPART 100 UNIT/ML ~~LOC~~ SOLN
0.0000 [IU] | Freq: Three times a day (TID) | SUBCUTANEOUS | Status: DC
  Administered 2015-07-12 – 2015-07-14 (×3): 3 [IU] via SUBCUTANEOUS
  Administered 2015-07-14: 2 [IU] via SUBCUTANEOUS

## 2015-07-12 MED ORDER — ONDANSETRON 4 MG PO TBDP
4.0000 mg | ORAL_TABLET | Freq: Three times a day (TID) | ORAL | Status: DC | PRN
  Filled 2015-07-12: qty 1

## 2015-07-12 MED ORDER — ASPIRIN EC 81 MG PO TBEC: 81.0000 mg | DELAYED_RELEASE_TABLET | Freq: Every day | ORAL | Status: DC

## 2015-07-12 MED ORDER — HEPARIN SODIUM (PORCINE) 1000 UNIT/ML DIALYSIS
8000.0000 [IU] | Freq: Once | INTRAMUSCULAR | Status: DC
  Filled 2015-07-12: qty 8

## 2015-07-12 MED ORDER — SEVELAMER CARBONATE 800 MG PO TABS
1600.0000 mg | ORAL_TABLET | Freq: Three times a day (TID) | ORAL | Status: DC
  Administered 2015-07-12 – 2015-07-14 (×8): 1600 mg via ORAL
  Filled 2015-07-12 (×8): qty 2

## 2015-07-12 MED ORDER — ASPIRIN 325 MG PO TABS
325.0000 mg | ORAL_TABLET | Freq: Every day | ORAL | Status: DC
  Administered 2015-07-12 – 2015-07-14 (×3): 325 mg via ORAL
  Filled 2015-07-12 (×4): qty 1

## 2015-07-12 MED ORDER — GABAPENTIN 100 MG PO CAPS
100.0000 mg | ORAL_CAPSULE | Freq: Two times a day (BID) | ORAL | Status: DC
  Administered 2015-07-12: 100 mg via ORAL
  Filled 2015-07-12: qty 1

## 2015-07-12 MED ORDER — IPRATROPIUM BROMIDE 0.02 % IN SOLN
0.5000 mg | Freq: Once | RESPIRATORY_TRACT | Status: AC
  Administered 2015-07-12: 0.5 mg via RESPIRATORY_TRACT
  Filled 2015-07-12: qty 2.5

## 2015-07-12 MED ORDER — INSULIN DETEMIR 100 UNIT/ML FLEXPEN: 15.0000 [IU] | PEN_INJECTOR | Freq: Two times a day (BID) | SUBCUTANEOUS | Status: DC

## 2015-07-12 MED ORDER — ACETAMINOPHEN 325 MG PO TABS
650.0000 mg | ORAL_TABLET | Freq: Once | ORAL | Status: AC
  Administered 2015-07-12: 650 mg via ORAL
  Filled 2015-07-12: qty 2

## 2015-07-12 MED ORDER — SODIUM CHLORIDE 0.9 % IV SOLN: 100.0000 mL | INTRAVENOUS | Status: DC | PRN

## 2015-07-12 MED ORDER — DM-GUAIFENESIN ER 30-600 MG PO TB12
1.0000 | ORAL_TABLET | Freq: Two times a day (BID) | ORAL | Status: DC
  Administered 2015-07-12 – 2015-07-14 (×6): 1 via ORAL
  Filled 2015-07-12 (×8): qty 1

## 2015-07-12 MED ORDER — METHIMAZOLE 5 MG PO TABS
5.0000 mg | ORAL_TABLET | Freq: Every day | ORAL | Status: DC
  Administered 2015-07-12 – 2015-07-13 (×3): 5 mg via ORAL
  Filled 2015-07-12 (×5): qty 1

## 2015-07-12 MED ORDER — DARBEPOETIN ALFA 60 MCG/0.3ML IJ SOSY: 60.0000 ug | PREFILLED_SYRINGE | INTRAMUSCULAR | Status: DC

## 2015-07-12 MED ORDER — HYDROCODONE-ACETAMINOPHEN 5-325 MG PO TABS
ORAL_TABLET | ORAL | Status: AC
  Filled 2015-07-12: qty 1

## 2015-07-12 MED ORDER — VANCOMYCIN HCL 10 G IV SOLR
1500.0000 mg | INTRAVENOUS | Status: AC
  Administered 2015-07-12: 1500 mg via INTRAVENOUS
  Filled 2015-07-12: qty 1500

## 2015-07-12 MED ORDER — ALTEPLASE 2 MG IJ SOLR: 2.0000 mg | Freq: Once | INTRAMUSCULAR | Status: DC | PRN

## 2015-07-12 MED ORDER — DARBEPOETIN ALFA 60 MCG/0.3ML IJ SOSY
PREFILLED_SYRINGE | INTRAMUSCULAR | Status: AC
  Administered 2015-07-12: 60 ug via INTRAVENOUS
  Filled 2015-07-12: qty 0.3

## 2015-07-12 MED ORDER — AMLODIPINE BESYLATE 10 MG PO TABS
10.0000 mg | ORAL_TABLET | Freq: Every day | ORAL | Status: DC
  Administered 2015-07-12: 10 mg via ORAL
  Filled 2015-07-12: qty 1

## 2015-07-12 MED ORDER — SEVELAMER CARBONATE 800 MG PO TABS: 800.0000 mg | ORAL_TABLET | Freq: Every day | ORAL | Status: DC

## 2015-07-12 MED ORDER — HYDROCODONE-ACETAMINOPHEN 5-325 MG PO TABS
1.0000 | ORAL_TABLET | Freq: Four times a day (QID) | ORAL | Status: DC | PRN
  Administered 2015-07-12 – 2015-07-13 (×4): 1 via ORAL
  Filled 2015-07-12 (×3): qty 1

## 2015-07-12 MED ORDER — DOXERCALCIFEROL 4 MCG/2ML IV SOLN
10.0000 ug | INTRAVENOUS | Status: DC
  Administered 2015-07-14: 10 ug via INTRAVENOUS
  Filled 2015-07-12: qty 6

## 2015-07-12 MED ORDER — METOCLOPRAMIDE HCL 5 MG PO TABS: 5.0000 mg | ORAL_TABLET | Freq: Three times a day (TID) | ORAL | Status: DC | PRN

## 2015-07-12 MED ORDER — PENTAFLUOROPROP-TETRAFLUOROETH EX AERO: 1.0000 "application " | INHALATION_SPRAY | CUTANEOUS | Status: DC | PRN

## 2015-07-12 MED ORDER — AMLODIPINE BESYLATE 10 MG PO TABS
10.0000 mg | ORAL_TABLET | Freq: Once | ORAL | Status: AC
Start: 2015-07-12 — End: 2015-07-12
  Administered 2015-07-12: 10 mg via ORAL
  Filled 2015-07-12: qty 1

## 2015-07-12 MED ORDER — FUROSEMIDE 10 MG/ML IJ SOLN: 80.0000 mg | Freq: Once | INTRAMUSCULAR | Status: DC

## 2015-07-12 MED ORDER — IOPAMIDOL (ISOVUE-370) INJECTION 76%
100.0000 mL | Freq: Once | INTRAVENOUS | Status: AC | PRN
  Administered 2015-07-12: 100 mL via INTRAVENOUS

## 2015-07-12 MED ORDER — LIDOCAINE HCL (PF) 1 % IJ SOLN: 5.0000 mL | INTRAMUSCULAR | Status: DC | PRN

## 2015-07-12 MED ORDER — PIPERACILLIN-TAZOBACTAM 3.375 G IVPB 30 MIN
3.3750 g | INTRAVENOUS | Status: AC
  Administered 2015-07-12: 3.375 g via INTRAVENOUS
  Filled 2015-07-12: qty 50

## 2015-07-12 MED ORDER — HEPARIN SODIUM (PORCINE) 5000 UNIT/ML IJ SOLN
5000.0000 [IU] | Freq: Three times a day (TID) | INTRAMUSCULAR | Status: DC
  Administered 2015-07-12 – 2015-07-14 (×7): 5000 [IU] via SUBCUTANEOUS
  Filled 2015-07-12 (×7): qty 1

## 2015-07-12 MED ORDER — GABAPENTIN 100 MG PO CAPS
200.0000 mg | ORAL_CAPSULE | Freq: Every day | ORAL | Status: DC
  Administered 2015-07-12 – 2015-07-13 (×2): 200 mg via ORAL
  Filled 2015-07-12 (×2): qty 2

## 2015-07-12 MED ORDER — ALBUTEROL SULFATE (2.5 MG/3ML) 0.083% IN NEBU: 2.5000 mg | INHALATION_SOLUTION | RESPIRATORY_TRACT | Status: DC | PRN

## 2015-07-12 MED ORDER — TOPIRAMATE 25 MG PO TABS
50.0000 mg | ORAL_TABLET | Freq: Every day | ORAL | Status: DC
  Administered 2015-07-12 – 2015-07-13 (×2): 50 mg via ORAL
  Filled 2015-07-12 (×3): qty 2

## 2015-07-12 MED ORDER — SEVELAMER CARBONATE 800 MG PO TABS: 800.0000 mg | ORAL_TABLET | Freq: Two times a day (BID) | ORAL | Status: DC | PRN

## 2015-07-12 MED ORDER — SODIUM CHLORIDE 0.9 % IV SOLN: INTRAVENOUS | Status: DC

## 2015-07-12 MED ORDER — BUDESONIDE 0.5 MG/2ML IN SUSP
1.0000 mg | Freq: Two times a day (BID) | RESPIRATORY_TRACT | Status: DC
  Administered 2015-07-12 – 2015-07-13 (×3): 1 mg via RESPIRATORY_TRACT
  Filled 2015-07-12 (×4): qty 4

## 2015-07-12 MED ORDER — GABAPENTIN 100 MG PO CAPS
100.0000 mg | ORAL_CAPSULE | Freq: Every day | ORAL | Status: DC
  Administered 2015-07-12 – 2015-07-14 (×3): 100 mg via ORAL
  Filled 2015-07-12 (×3): qty 1

## 2015-07-12 MED ORDER — PANTOPRAZOLE SODIUM 40 MG PO TBEC
40.0000 mg | DELAYED_RELEASE_TABLET | Freq: Every day | ORAL | Status: DC
  Administered 2015-07-12 – 2015-07-14 (×3): 40 mg via ORAL
  Filled 2015-07-12 (×3): qty 1

## 2015-07-12 MED ORDER — SIMVASTATIN 10 MG PO TABS
5.0000 mg | ORAL_TABLET | Freq: Every day | ORAL | Status: DC
  Administered 2015-07-12 – 2015-07-13 (×3): 5 mg via ORAL
  Filled 2015-07-12 (×4): qty 1

## 2015-07-12 MED ORDER — RENA-VITE PO TABS
1.0000 | ORAL_TABLET | Freq: Every day | ORAL | Status: DC
  Administered 2015-07-12 – 2015-07-13 (×2): 1 via ORAL
  Filled 2015-07-12 (×2): qty 1

## 2015-07-12 MED ORDER — LABETALOL HCL 100 MG PO TABS: 300.0000 mg | ORAL_TABLET | ORAL | Status: DC

## 2015-07-12 MED ORDER — DARBEPOETIN ALFA 60 MCG/0.3ML IJ SOSY
60.0000 ug | PREFILLED_SYRINGE | Freq: Once | INTRAMUSCULAR | Status: AC
  Administered 2015-07-12: 60 ug via INTRAVENOUS
  Filled 2015-07-12: qty 0.3

## 2015-07-12 MED ORDER — DARBEPOETIN ALFA 60 MCG/0.3ML IJ SOSY
60.0000 ug | PREFILLED_SYRINGE | Freq: Once | INTRAMUSCULAR | Status: DC
  Administered 2015-07-12: 60 ug via INTRAVENOUS
  Filled 2015-07-12: qty 0.3

## 2015-07-12 MED ORDER — DOXERCALCIFEROL 4 MCG/2ML IV SOLN
10.0000 ug | Freq: Once | INTRAVENOUS | Status: AC
  Administered 2015-07-12: 10 ug via INTRAVENOUS
  Filled 2015-07-12: qty 6

## 2015-07-12 MED ORDER — HYDRALAZINE HCL 20 MG/ML IJ SOLN: 5.0000 mg | INTRAMUSCULAR | Status: DC | PRN

## 2015-07-12 MED ORDER — BOOST / RESOURCE BREEZE PO LIQD
1.0000 | Freq: Two times a day (BID) | ORAL | Status: DC
  Administered 2015-07-14: 1 via ORAL

## 2015-07-12 MED ORDER — CITALOPRAM HYDROBROMIDE 10 MG PO TABS
10.0000 mg | ORAL_TABLET | Freq: Every day | ORAL | Status: DC
  Administered 2015-07-12 – 2015-07-13 (×3): 10 mg via ORAL
  Filled 2015-07-12 (×4): qty 1

## 2015-07-12 NOTE — ED Notes (Signed)
Lab collected cultures antibiotics started

## 2015-07-12 NOTE — Progress Notes (Signed)
Patient admitted after midnight, please see H&P.  Patient has not had dialysis since last Friday, suspect fluid overload causing SOB.  Missed dialysis due to diarrhea, now resolved.  For dialysis today. Await flu screen.  Will d/c abx as doubt PNA.  Eulogio Bear DO

## 2015-07-12 NOTE — Consult Note (Signed)
Renal Service Consult Note [facility]  LUDIA HICKSON 07/12/2015 [place] Requesting Physician:  Dr Eliseo Squires  Reason for Consult:  ESRD pt with chest pain/ SOB/ nausea HPI: The patient is a 40 y.o. year-old with hx of HTN, HL, DM2, recurrent chest pain (hx nonobst CAD by cath 2014). Hx depression and ESRD on HD MWF. Came to ED last night for cough, chest congestion, purulent sputum (greenish) , SOB and abd pain.  Had diarrhea "last week" but this has resolved, still some nausea.  +Nasal congestion.  CXR and CT chest showed LLL process, pt admitted with dx of HCAP and started on IV abx.  Asked to see for dialysis.  Had last HD Wed.    ROS  no joint pain   no HA  no blurry vision  no rash  no diarrhea  no dysuria  no difficulty voiding  no change in urine color    Past Medical History  Past Medical History  Diagnosis Date  . Asthma   . ESRD on hemodialysis ([place])     started dialysis 01/29/11  . Weight loss   . Loss of appetite   . CHF (congestive heart failure) ([place])   . Hypertension     a. Also with hx of hypotension - has been started on midodrine.  . Hyperlipidemia   . Diabetes mellitus   . Peripheral neuropathy ([place])   . Iron deficiency anemia   . Hyperparathyroidism, secondary ([place])   . Sciatica   . GERD (gastroesophageal reflux disease)   . Adenoma   . Gastroparesis   . Esophageal erosions     a. Per EGD 10/2011.  Marland Kitchen CAD (coronary artery disease)     a. False positive stress echo 06/2012 at [facility] - cath with mild nonobstructive CAD at [facility] (mild luminal irregularities in LAD, moderate diffuse disease in distal RCA).  . History of echocardiogram     Echo 9/14: EF 55-60%, normal wall motion, normal diastolic function, PASP 21  . Chronic chest pain     a. H/o longstanding atypical CP. Nonobstructive cath 06/2012 and negative VQ.  Marland Kitchen ASCVD (arteriosclerotic cardiovascular disease)   . Anginal pain ([place])   . Peripheral vascular disease ([place])   .  PONV (postoperative nausea and vomiting)   . Shortness of breath 07/12/2015  . Depression   . HCAP (healthcare-associated pneumonia) 07/12/2015   Past Surgical History  Past Surgical History  Procedure Laterality Date  . Cesarean section    . Cholecystectomy  1996  . Av fistula placement  08/2010    Left radiocephalic AVF  . Ligation goretex fistula  01/04/11    Left AVF  . Colonoscopy    . Esophagogastroduodenoscopy  10/19/2011    Dr. Silvano Rusk  . Foot surgery Left   . Tubal ligation    . Left heart catheterization with coronary angiogram N/A 06/27/2012    Procedure: LEFT HEART CATHETERIZATION WITH CORONARY ANGIOGRAM;  Surgeon: Larey Dresser, MD;  Location: [facility] CATH LAB;  Service: Cardiovascular;  Laterality: N/A;  . Fracture surgery Left     left femur  . Dilation and curettage of uterus    . Av fistula placement Right 05/07/2014    Procedure: ARTERIOVENOUS (AV) FISTULA CREATION;  Surgeon: Elam Dutch, MD;  Location: [place];  Service: Vascular;  Laterality: Right;  . Av fistula placement Right 07/02/2014    Procedure: INSERTION OF ARTERIOVENOUS (AV) GORE-TEX GRAFT ARM;  Surgeon: Elam Dutch, MD;  Location: [place];  Service: Vascular;  Laterality: Right;  . Thrombectomy and revision of arterioventous (av) goretex  graft Right 07/16/2014    Procedure: THROMBECTOMY  Right  arm  ARTERIOVENOUS  GORETEX  GRAFT;  Surgeon: Elam Dutch, MD;  Location: [place];  Service: Vascular;  Laterality: Right;  . Peripheral vascular catheterization Left 08/08/2014    Procedure: Upper Extremity Angiography;  Surgeon: Algernon Huxley, MD;  Location: [place] CV LAB;  Service: Cardiovascular;  Laterality: Left;  . Peripheral vascular catheterization Left 08/08/2014    Procedure: Upper Extremity Intervention;  Surgeon: Algernon Huxley, MD;  Location: [place] CV LAB;  Service: Cardiovascular;  Laterality: Left;  . Ligation of arteriovenous  fistula Left 08/21/2014    Procedure: LIGATION OF  ARTERIOVENOUS  FISTULA;  Surgeon: Algernon Huxley, MD;  Location: [facility] ORS;  Service: Vascular;  Laterality: Left;   Family History  Family History  Problem Relation Age of Onset  . Heart disease Mother     heart attack at 54 y/o-infected valve  . Kidney disease Mother     was a dialysis patient  . Heart disease Brother   . Kidney disease Maternal Grandmother     pre-dialysis  . Kidney failure Paternal Uncle   . Kidney failure Paternal Aunt   . Diabetes Father   . Hypertension Father   . Colon cancer Neg Hx   . Arthritis Brother   . Diabetes Maternal Aunt   . Hypertension Maternal Aunt   . Diabetes Paternal Aunt   . Heart disease Paternal Aunt   . Hypertension Paternal Aunt    Social History  reports that she has never smoked. She has never used smokeless tobacco. She reports that she does not drink alcohol or use illicit drugs. Allergies  Allergies  Allergen Reactions  . Ciprofloxacin Hives, Itching and Nausea And Vomiting  . Fluocinolone Itching, Nausea And Vomiting and Other (See Comments)  . Pineapple Itching and Other (See Comments)    Pt states that this medication makes her tongue raw.    . Strawberry Extract Itching and Other (See Comments)    Pt states that this medication makes her tongue raw.    . Adhesive [Tape] Itching and Rash  . Chlorhexidine Gluconate Itching  . Clindamycin Diarrhea, Nausea And Vomiting and Rash  . Oxycodone Nausea And Vomiting, Rash and Other (See Comments)    Reaction:  Hallucinations   . Shrimp [Shellfish Allergy] Rash   Home medications Prior to Admission medications   Medication Sig Start Date End Date Taking? Authorizing Provider  albuterol (PROVENTIL HFA;VENTOLIN HFA) 108 (90 BASE) MCG/ACT inhaler Inhale 2 puffs into the lungs every 6 (six) hours as needed for wheezing or shortness of breath.   Yes Historical Provider, MD  amLODipine (NORVASC) 10 MG tablet Take 10 mg by mouth at bedtime.  06/13/14  Yes Historical Provider, MD  aspirin  EC 81 MG tablet Take 81 mg by mouth daily.   Yes Historical Provider, MD  Calcium Carbonate-Vitamin D (CALCIUM 600+D) 600-400 MG-UNIT tablet Take 1 tablet by mouth daily.   Yes Historical Provider, MD  citalopram (CELEXA) 10 MG tablet Take 10 mg by mouth at bedtime.    Yes Historical Provider, MD  fluticasone (FLOVENT HFA) 110 MCG/ACT inhaler Inhale 2 puffs into the lungs 2 (two) times daily. 03/26/15  Yes Rolland Porter, MD  gabapentin (NEURONTIN) 100 MG capsule Take 100-200 mg by mouth 2 (two) times daily. Pt takes one capsule in the morning and two at bedtime.  Yes Historical Provider, MD  HYDROcodone-acetaminophen (NORCO) 5-325 MG per tablet Take 1 tablet by mouth every 6 (six) hours as needed for moderate pain. 08/21/14  Yes Algernon Huxley, MD  hydrOXYzine (ATARAX/VISTARIL) 25 MG tablet Take 25 mg by mouth every 4 (four) hours as needed for itching.    Yes Historical Provider, MD  Insulin Detemir (LEVEMIR FLEXTOUCH) 100 UNIT/ML Pen Inject 22 Units into the skin every 12 (twelve) hours.    Yes Historical Provider, MD  labetalol (NORMODYNE) 300 MG tablet Take 300 mg by mouth See admin instructions. Pt takes one tablet at bedtime on Monday, Wednesday, and Friday.   All other days pt takes one tablet twice daily.   Yes Historical Provider, MD  linagliptin (TRADJENTA) 5 MG TABS tablet Take 5 mg by mouth at bedtime.   Yes Historical Provider, MD  methimazole (TAPAZOLE) 5 MG tablet Take 5 mg by mouth at bedtime.    Yes Historical Provider, MD  multivitamin (RENA-VIT) TABS tablet Take 1 tablet by mouth daily.   Yes Historical Provider, MD  omeprazole (PRILOSEC) 20 MG capsule Take 20 mg by mouth daily.   Yes Historical Provider, MD  ondansetron (ZOFRAN ODT) 4 MG disintegrating tablet Take 1 tablet (4 mg total) by mouth every 8 (eight) hours as needed for nausea or vomiting. 06/17/15  Yes Merryl Hacker, MD  sevelamer carbonate (RENVELA) 800 MG tablet Take 800-1,600 mg by mouth 5 (five) times daily. Pt takes two  tablets three times daily with meals and one tablet two times daily with snacks.   Yes Historical Provider, MD  simvastatin (ZOCOR) 5 MG tablet Take 5 mg by mouth at bedtime.   Yes Historical Provider, MD  topiramate (TOPAMAX) 50 MG tablet Take 50 mg by mouth at bedtime.    Yes Historical Provider, MD  traMADol (ULTRAM) 50 MG tablet Take 1 tablet (50 mg total) by mouth every 6 (six) hours as needed. Patient not taking: Reported on 07/11/2015 02/09/15   Janne Napoleon, NP  traMADol-acetaminophen (ULTRACET) 37.5-325 MG tablet Take 2 tablets by mouth every 6 (six) hours as needed. Patient not taking: Reported on 07/11/2015 05/02/15   Charlesetta Shanks, MD   Liver Function Tests  Recent Labs Lab 07/12/15 0020  AST 12*  ALT 12*  ALKPHOS 62  BILITOT 0.2*  PROT 6.7  ALBUMIN 3.3*    Recent Labs Lab 07/12/15 0020  LIPASE 45   CBC  Recent Labs Lab 07/12/15 0010  WBC 7.5  NEUTROABS 5.1  HGB 10.6*  HCT 32.4*  MCV 83.9  PLT 0000000   Basic Metabolic Panel  Recent Labs Lab 07/12/15 0020  NA 140  K 4.1  CL 109  CO2 16*  GLUCOSE 131*  BUN 61*  CREATININE 11.89*  CALCIUM 8.7*    Filed Vitals:   07/12/15 0403 07/12/15 0540 07/12/15 0800 07/12/15 0837  BP: 154/106 157/92 124/91   Pulse: 104 100 107   Temp:  98.5 F (36.9 C)    TempSrc:  Oral    Resp: 20 22 18    Height:  5\' 6"  (1.676 m)    Weight:  95.4 kg (210 lb 5.1 oz)    SpO2: 97% 98% 95% 95%   Exam Alert, no distress, tired appearing No rash, cyanosis or gangrene Sclera anicteric, throat clear  No jvd or bruits Chest rales L base, R clear RRR no MRG Abd soft ntnd no mass or ascites +bs GU deferred MS no joint effusions or deformity Ext 1-2+ pitting pretib  bilat LE edema / no wounds or ulcers Neuro is alert, Ox 3 , nf L forearm AVF +bruit   Dialysis:MWF [facility]  3h 26min   93.5kg  2/2.25 bath  Heparin 9000   LFA AVF Mircera 75 ug q2, last 3.27 > 9.7/ 26% / -- Hect 10 ug / RV 800 2ac tid > 8.6/ 4.8/  331  Assessment: 1. Cough/ CP - pt has HCAP - LLL pneumonia, on IV abx 2. ESRD - missed last HD x 3 3. Vol excess - up 2kg by wt but more on exam 4. HTN - cont norvasc/ labet 5. DM2 on insulin 6. Abd pain - CT abd neg   Plan - HD today, max UF, 4hrs  Kelly Splinter MD [employer] pager [phone]    cell [phone] 07/12/2015, 10:20 AM

## 2015-07-12 NOTE — Progress Notes (Signed)
Initial Nutrition Assessment  DOCUMENTATION CODES:   Obesity unspecified  INTERVENTION:  Boost Breeze po BID, each supplement provides 250 kcal and 9 grams of protein  NUTRITION DIAGNOSIS:   Increased nutrient needs related to  (hemodialysis) as evidenced by estimated needs.  GOAL:   Patient will meet greater than or equal to 90% of their needs  MONITOR:   PO intake, Supplement acceptance, Weight trends, I & O's  REASON FOR ASSESSMENT:   Malnutrition Screening Tool    ASSESSMENT:   The patient is a 40 y.o. year-old with hx of HTN, HL, DM2, recurrent chest pain (hx nonobst CAD by cath 2014). Hx depression and ESRD on HD MWF. Came to ED last night for cough, chest congestion, purulent sputum (greenish) , SOB and abd pain. Had diarrhea "last week" but this has resolved, still some nausea. +Nasal congestion. CXR and CT chest showed LLL process, pt admitted with dx of HCAP and started on IV abx.  Pt reports decreased appetite x 1 week.  Pt falls asleep easily.   Medications reviewed and include: oscal with D, rena-vit, renvela Labs reviewed: CBG: 127, negative for flu a/b   Diet Order:  Diet renal with fluid restriction Fluid restriction:: 1200 mL Fluid; Room service appropriate?: Yes; Fluid consistency:: Thin  Skin:  Reviewed, no issues  Last BM:  unknown  Height:   Ht Readings from Last 1 Encounters:  07/12/15 5\' 6"  (1.676 m)    Weight:   Wt Readings from Last 1 Encounters:  07/12/15 210 lb 5.1 oz (95.4 kg)    Ideal Body Weight:  59 kg  BMI:  Body mass index is 33.96 kg/(m^2).  Estimated Nutritional Needs:   Kcal:  2000-2200  Protein:  90-110 grams  Fluid:  1.2 L/day  EDUCATION NEEDS:   No education needs identified at this time  [place], [place], [place] Pager [phone] After Hours Pager

## 2015-07-12 NOTE — ED Provider Notes (Signed)
CSN: [id number]     Arrival date & time 07/11/15  2321 History   First MD Initiated Contact with Patient 07/11/15 2348     Chief Complaint  Patient presents with  . Shortness of Breath  . Chest Pain    HPI   Linda Ellison is a 40 y.o. female with a PMH of ESRD on HD, CHF, HTN, HLD, DM, CAD, chronic chest pain who presents to the ED with left sided chest pain, which she states started yesterday and has been constant since that time. She denies exacerbating or alleviating factors. She notes associated cough, shortness of breath, and nausea. She denies fever, chills, abdominal pain, vomiting, diarrhea, constipation. She states she has a history of DVT. She denies recent travel or immobility, recent surgery, history of malignancy, estrogen use. She reports she typically goes to dialysis Monday, Wednesday, and Friday, though missed her dialysis this week due to not feeling well.   Past Medical History  Diagnosis Date  . Asthma   . ESRD on hemodialysis ([place])     started dialysis 01/29/11  . Weight loss   . Loss of appetite   . CHF (congestive heart failure) ([place])   . Hypertension     a. Also with hx of hypotension - has been started on midodrine.  . Hyperlipidemia   . Diabetes mellitus   . Peripheral neuropathy ([place])   . Iron deficiency anemia   . Hyperparathyroidism, secondary ([place])   . Sciatica   . GERD (gastroesophageal reflux disease)   . Adenoma   . Gastroparesis   . Esophageal erosions     a. Per EGD 10/2011.  Marland Kitchen CAD (coronary artery disease)     a. False positive stress echo 06/2012 at [facility] - cath with mild nonobstructive CAD at [facility] (mild luminal irregularities in LAD, moderate diffuse disease in distal RCA).  . History of echocardiogram     Echo 9/14: EF 55-60%, normal wall motion, normal diastolic function, PASP 21  . Chronic chest pain     a. H/o longstanding atypical CP. Nonobstructive cath 06/2012 and negative VQ.  Marland Kitchen ASCVD (arteriosclerotic cardiovascular disease)   .  Anginal pain ([place])   . Peripheral vascular disease ([place])   . PONV (postoperative nausea and vomiting)   . Shortness of breath 07/12/2015   Past Surgical History  Procedure Laterality Date  . Cesarean section    . Cholecystectomy  1996  . Av fistula placement  08/2010    Left radiocephalic AVF  . Ligation goretex fistula  01/04/11    Left AVF  . Colonoscopy    . Esophagogastroduodenoscopy  10/19/2011    Dr. Silvano Rusk  . Foot surgery Left   . Tubal ligation    . Left heart catheterization with coronary angiogram N/A 06/27/2012    Procedure: LEFT HEART CATHETERIZATION WITH CORONARY ANGIOGRAM;  Surgeon: Larey Dresser, MD;  Location: [facility] CATH LAB;  Service: Cardiovascular;  Laterality: N/A;  . Fracture surgery Left     left femur  . Dilation and curettage of uterus    . Av fistula placement Right 05/07/2014    Procedure: ARTERIOVENOUS (AV) FISTULA CREATION;  Surgeon: Elam Dutch, MD;  Location: [place];  Service: Vascular;  Laterality: Right;  . Av fistula placement Right 07/02/2014    Procedure: INSERTION OF ARTERIOVENOUS (AV) GORE-TEX GRAFT ARM;  Surgeon: Elam Dutch, MD;  Location: [facility] OR;  Service: Vascular;  Laterality: Right;  . Thrombectomy and revision of arterioventous (av) goretex  graft Right 07/16/2014    Procedure: THROMBECTOMY  Right  arm  ARTERIOVENOUS  GORETEX  GRAFT;  Surgeon: Elam Dutch, MD;  Location: [place];  Service: Vascular;  Laterality: Right;  . Peripheral vascular catheterization Left 08/08/2014    Procedure: Upper Extremity Angiography;  Surgeon: Algernon Huxley, MD;  Location: [place] CV LAB;  Service: Cardiovascular;  Laterality: Left;  . Peripheral vascular catheterization Left 08/08/2014    Procedure: Upper Extremity Intervention;  Surgeon: Algernon Huxley, MD;  Location: [place] CV LAB;  Service: Cardiovascular;  Laterality: Left;  . Ligation of arteriovenous  fistula Left 08/21/2014    Procedure: LIGATION OF ARTERIOVENOUS  FISTULA;  Surgeon: Algernon Huxley, MD;  Location: [facility] ORS;  Service: Vascular;  Laterality: Left;   Family History  Problem Relation Age of Onset  . Heart disease Mother     heart attack at 30 y/o-infected valve  . Kidney disease Mother     was a dialysis patient  . Heart disease Brother   . Kidney disease Maternal Grandmother     pre-dialysis  . Kidney failure Paternal Uncle   . Kidney failure Paternal Aunt   . Diabetes Father   . Hypertension Father   . Colon cancer Neg Hx   . Arthritis Brother   . Diabetes Maternal Aunt   . Hypertension Maternal Aunt   . Diabetes Paternal Aunt   . Heart disease Paternal Aunt   . Hypertension Paternal Aunt    Social History  Substance Use Topics  . Smoking status: Never Smoker   . Smokeless tobacco: Never Used  . Alcohol Use: No   OB History    No data available       Review of Systems  Constitutional: Negative for fever, chills and diaphoresis.  Respiratory: Positive for cough and shortness of breath.   Cardiovascular: Positive for chest pain.  Gastrointestinal: Positive for nausea. Negative for vomiting, abdominal pain, diarrhea and constipation.  All other systems reviewed and are negative.     Allergies  Ciprofloxacin; Fluocinolone; Pineapple; Strawberry extract; Adhesive; Chlorhexidine gluconate; Clindamycin; Oxycodone; and Shrimp  Home Medications   Prior to Admission medications   Medication Sig Start Date End Date Taking? Authorizing Provider  albuterol (PROVENTIL HFA;VENTOLIN HFA) 108 (90 BASE) MCG/ACT inhaler Inhale 2 puffs into the lungs every 6 (six) hours as needed for wheezing or shortness of breath.   Yes Historical Provider, MD  amLODipine (NORVASC) 10 MG tablet Take 10 mg by mouth at bedtime.  06/13/14  Yes Historical Provider, MD  aspirin EC 81 MG tablet Take 81 mg by mouth daily.   Yes Historical Provider, MD  Calcium Carbonate-Vitamin D (CALCIUM 600+D) 600-400 MG-UNIT tablet Take 1 tablet by mouth daily.   Yes Historical Provider, MD   citalopram (CELEXA) 10 MG tablet Take 10 mg by mouth at bedtime.    Yes Historical Provider, MD  fluticasone (FLOVENT HFA) 110 MCG/ACT inhaler Inhale 2 puffs into the lungs 2 (two) times daily. 03/26/15  Yes Rolland Porter, MD  gabapentin (NEURONTIN) 100 MG capsule Take 100-200 mg by mouth 2 (two) times daily. Pt takes one capsule in the morning and two at bedtime.   Yes Historical Provider, MD  HYDROcodone-acetaminophen (NORCO) 5-325 MG per tablet Take 1 tablet by mouth every 6 (six) hours as needed for moderate pain. 08/21/14  Yes Algernon Huxley, MD  hydrOXYzine (ATARAX/VISTARIL) 25 MG tablet Take 25 mg by mouth every 4 (four) hours as needed for itching.  Yes Historical Provider, MD  Insulin Detemir (LEVEMIR FLEXTOUCH) 100 UNIT/ML Pen Inject 22 Units into the skin every 12 (twelve) hours.    Yes Historical Provider, MD  labetalol (NORMODYNE) 300 MG tablet Take 300 mg by mouth See admin instructions. Pt takes one tablet at bedtime on Monday, Wednesday, and Friday.   All other days pt takes one tablet twice daily.   Yes Historical Provider, MD  linagliptin (TRADJENTA) 5 MG TABS tablet Take 5 mg by mouth at bedtime.   Yes Historical Provider, MD  methimazole (TAPAZOLE) 5 MG tablet Take 5 mg by mouth at bedtime.    Yes Historical Provider, MD  multivitamin (RENA-VIT) TABS tablet Take 1 tablet by mouth daily.   Yes Historical Provider, MD  omeprazole (PRILOSEC) 20 MG capsule Take 20 mg by mouth daily.   Yes Historical Provider, MD  ondansetron (ZOFRAN ODT) 4 MG disintegrating tablet Take 1 tablet (4 mg total) by mouth every 8 (eight) hours as needed for nausea or vomiting. 06/17/15  Yes Merryl Hacker, MD  sevelamer carbonate (RENVELA) 800 MG tablet Take 800-1,600 mg by mouth 5 (five) times daily. Pt takes two tablets three times daily with meals and one tablet two times daily with snacks.   Yes Historical Provider, MD  simvastatin (ZOCOR) 5 MG tablet Take 5 mg by mouth at bedtime.   Yes Historical  Provider, MD  topiramate (TOPAMAX) 50 MG tablet Take 50 mg by mouth at bedtime.    Yes Historical Provider, MD  traMADol (ULTRAM) 50 MG tablet Take 1 tablet (50 mg total) by mouth every 6 (six) hours as needed. Patient not taking: Reported on 07/11/2015 02/09/15   Janne Napoleon, NP  traMADol-acetaminophen (ULTRACET) 37.5-325 MG tablet Take 2 tablets by mouth every 6 (six) hours as needed. Patient not taking: Reported on 07/11/2015 05/02/15   Charlesetta Shanks, MD    BP 192/109 mmHg  Pulse 94  Temp(Src) 97.9 F (36.6 C) (Oral)  Resp 21  Wt 93.4 kg  SpO2 100%  LMP 06/17/2015 (Approximate) Physical Exam  Constitutional: She is oriented to person, place, and time. She appears well-developed and well-nourished. No distress.  HENT:  Head: Normocephalic and atraumatic.  Right Ear: External ear normal.  Left Ear: External ear normal.  Nose: Nose normal.  Mouth/Throat: Uvula is midline, oropharynx is clear and moist and mucous membranes are normal.  Eyes: Conjunctivae, EOM and lids are normal. Pupils are equal, round, and reactive to light. Right eye exhibits no discharge. Left eye exhibits no discharge. No scleral icterus.  Neck: Normal range of motion. Neck supple.  Cardiovascular: Normal rate, regular rhythm, normal heart sounds, intact distal pulses and normal pulses.   Pulmonary/Chest: Effort normal and breath sounds normal. No respiratory distress. She has no wheezes. She has no rales.  Abdominal: Soft. Normal appearance and bowel sounds are normal. She exhibits no distension and no mass. There is no tenderness. There is no rigidity, no rebound and no guarding.  Musculoskeletal: Normal range of motion. She exhibits no edema or tenderness.  Neurological: She is alert and oriented to person, place, and time. She has normal strength. No sensory deficit.  Skin: Skin is warm, dry and intact. No rash noted. She is not diaphoretic. No erythema. No pallor.  Psychiatric: She has a normal mood and affect. Her  speech is normal and behavior is normal.  Nursing note and vitals reviewed.   ED Course  Procedures (including critical care time)  Labs Review Labs Reviewed  CBC WITH  DIFFERENTIAL/PLATELET - Abnormal; Notable for the following:    RBC 3.86 (*)    Hemoglobin 10.6 (*)    HCT 32.4 (*)    All other components within normal limits  COMPREHENSIVE METABOLIC PANEL - Abnormal; Notable for the following:    CO2 16 (*)    Glucose, Bld 131 (*)    BUN 61 (*)    Creatinine, Ser 11.89 (*)    Calcium 8.7 (*)    Albumin 3.3 (*)    AST 12 (*)    ALT 12 (*)    Total Bilirubin 0.2 (*)    GFR calc non Af Amer 4 (*)    GFR calc Af Amer 4 (*)    All other components within normal limits  CBG MONITORING, ED - Abnormal; Notable for the following:    Glucose-Capillary 127 (*)    All other components within normal limits  CULTURE, BLOOD (ROUTINE X 2)  CULTURE, BLOOD (ROUTINE X 2)  LIPASE, BLOOD  BRAIN NATRIURETIC PEPTIDE  I-STAT TROPOININ, ED  Randolm Idol, ED    Imaging Review Dg Chest 2 View  07/12/2015  CLINICAL DATA:  Centralized chest and back pain.History of asthma, CHF, diabetes, end-stage renal disease on dialysis. EXAM: CHEST  2 VIEW COMPARISON:  Chest radiograph June 17, 2015 FINDINGS: Cardiac silhouette is upper limits of normal in size, mediastinal silhouette is nonsuspicious. Increased interstitial markings with patchy LEFT lower lobe airspace opacity. No definite pleural effusion. No pneumothorax. Soft tissue planes included osseous structures are normal. IMPRESSION: Interstitial and LEFT lung base alveolar airspace opacities concerning for bronchopneumonia, or pulmonary edema. Electronically Signed   By: Elon Alas M.D.   On: 07/12/2015 01:10   I have personally reviewed and evaluated these images and lab results as part of my medical decision-making.   EKG Interpretation   Date/Time:  Friday July 11 2015 23:30:58 EDT Ventricular Rate:  91 PR Interval:  139 QRS  Duration: 88 QT Interval:  400 QTC Calculation: 492 R Axis:   37 Text Interpretation:  Sinus rhythm Confirmed by [facility]  MD, APRIL  ([id number]) on 07/12/2015 1:23:26 AM      Cardiac Cath 06/27/2012: Procedural Findings: Hemodynamics: AO 112/79 LV 103/11 Coronary angiography: Coronary dominance: right Left mainstem: No significant disease.  Left anterior descending (LAD): Mild luminal irregularities.  Left circumflex (LCx): No significant disease.  Right coronary artery (RCA): Moderate diffuse disease distal RCA.  Left ventriculography: Left ventricular systolic function is normal, LVEF is estimated at 60-65%, no regional wall motion abnormalities in the RAO projection  MDM   Final diagnoses:  CAP (community acquired pneumonia)  ESRD (end stage renal disease) on dialysis ([place])  Chest pain, unspecified chest pain type  Shortness of breath    40 year old female presents with chest pain, which she states started yesterday and has been constant since that time. Notes associated cough, shortness of breath, and nausea. Denies fever, chills, abdominal pain, vomiting, diarrhea, constipation. States she has a history of DVT. Denies recent travel or immobility, recent surgery, history of malignancy, estrogen use. Last cath in March 2014 as above.   Patient is afebrile. Hypertensive. Heart RRR. Lungs clear to auscultation bilaterally. No increased work of breathing or respiratory distress. Abdomen soft, non-tender, non-distended. Strength and sensation intact.  Patient states symptoms feel similar to asthma. Will give breathing treatment. Labs pending.  CBC negative for leukocytosis, hemoglobin 10.6. CMP remarkable for bicarbonate 16, BUN 61, creatinine 11.89. Lipase within normal limits. BNP pending. Chest x-ray remarkable  for interstitial and left lung base alveolar airspace opacities concerning for bronchopneumonia or pulmonary edema. Will obtain blood cultures and start on  antibiotics given patient reports productive cough.   Patient states she did not take her BP meds today. Will give home dose of amlodipine. Patient discussed with Dr. Randal Buba. Will also give lasix. Hospitalist consulted for admission. Spoke with Dr. Blaine Hamper, patient to be admitted at [facility] for further evaluation and management.  BP 192/109 mmHg  Pulse 94  Temp(Src) 97.9 F (36.6 C) (Oral)  Resp 21  Wt 93.4 kg  SpO2 100%  LMP 06/17/2015 (Approximate)          Marella Chimes, PA-C 07/12/15 0154  Veatrice Kells, MD 07/12/15 [phone]

## 2015-07-12 NOTE — ED Notes (Signed)
Pt in ct angio

## 2015-07-12 NOTE — Evaluation (Signed)
Physical Therapy Evaluation Patient Details Name: Linda Ellison MRN: [id number] DOB: 04-Nov-1975 Today's Date: 07/12/2015   History of Present Illness  pt presents with Chest Pain and SOB, with pt being worked up for Flu.  pt with hx of ESRD, HTN, DM, Depression, HF, CAD, PVD, Peripheral Neuropathy, and L Femur fx.  Clinical Impression  Pt mildly unsteady and indicates fatigued with ambulation.  Pt admits to missing HD this week and is noting her Bil hands are more swollen than normal for her.  Feel pt will be able to make great progress to return to her home with her children's support.  Will continue to follow.      Follow Up Recommendations No PT follow up;Supervision - Intermittent    Equipment Recommendations  None recommended by PT    Recommendations for Other Services       Precautions / Restrictions Precautions Precautions: None Restrictions Weight Bearing Restrictions: No      Mobility  Bed Mobility Overal bed mobility: Modified Independent             General bed mobility comments: Needs increased time, but no physical A.  Transfers Overall transfer level: Needs assistance Equipment used: None Transfers: Sit to/from Stand Sit to Stand: Supervision         General transfer comment: pt needs UE support to bring self to standing, but no physical A needed.    Ambulation/Gait Ambulation/Gait assistance: Min guard Ambulation Distance (Feet): 100 Feet Assistive device: None Gait Pattern/deviations: Step-through pattern;Decreased stride length     General Gait Details: pt moves slowly and with occasionaly use of rail in hallway.  pt with mild balance deficits and indicates she feels fatigued.    Stairs            Wheelchair Mobility    Modified Rankin (Stroke Patients Only)       Balance Overall balance assessment: Needs assistance Sitting-balance support: No upper extremity supported;Feet supported Sitting balance-Leahy Scale: Good      Standing balance support: No upper extremity supported;During functional activity Standing balance-Leahy Scale: Fair                               Pertinent Vitals/Pain Pain Assessment: No/denies pain    Home Living Family/patient expects to be discharged to:: Private residence Living Arrangements: Children Available Help at Discharge: Family;Available PRN/intermittently Type of Home: Apartment Home Access: Level entry     Home Layout: One level Home Equipment: None Additional Comments: pt's son is in his 56's and daughter is a teenager.  Both can help some, but not provide extensive A.      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, newspaper        Extremity/Trunk Assessment   Upper Extremity Assessment: Overall WFL for tasks assessed           Lower Extremity Assessment: Overall WFL for tasks assessed      Cervical / Trunk Assessment: Normal  Communication   Communication: No difficulties  Cognition Arousal/Alertness: Awake/alert Behavior During Therapy: WFL for tasks assessed/performed Overall Cognitive Status: Within Functional Limits for tasks assessed                      General Comments      Exercises        Assessment/Plan  PT Assessment Patient needs continued PT services  PT Diagnosis Difficulty walking   PT Problem List Decreased activity tolerance;Decreased balance;Decreased mobility;Decreased knowledge of use of DME;Obesity  PT Treatment Interventions DME instruction;Gait training;Functional mobility training;Therapeutic activities;Therapeutic exercise;Balance training;Patient/family education   PT Goals (Current goals can be found in the Care Plan section) Acute Rehab PT Goals Patient Stated Goal: Back to work PT Goal Formulation: With patient Time For Goal Achievement: 07/19/15 Potential to Achieve Goals: Good    Frequency Min 3X/week    Barriers to discharge        Co-evaluation               End of Session Equipment Utilized During Treatment: Gait belt Activity Tolerance: Patient limited by fatigue Patient left: in bed;with call bell/phone within reach;with nursing/sitter in room (Sitting EOB with RN) Nurse Communication: Mobility status         Time: [id number] PT Time Calculation (min) (ACUTE ONLY): 23 min   Charges:   PT Evaluation $PT Eval Moderate Complexity: 1 Procedure PT Treatments $Gait Training: 8-22 mins   PT G CodesCatarina Hartshorn, [place] 07/12/2015, 9:56 AM

## 2015-07-12 NOTE — Plan of Care (Signed)
Problem: Pain Managment: Goal: General experience of comfort will improve Outcome: Completed/Met Date Met:  07/12/15 Pt educated on pain scale and interventions. Pt verbalized understanding.      

## 2015-07-12 NOTE — H&P (Signed)
[facility] History and Physical  Linda Ellison [id number] DOB: 1975/12/04 DOA: 07/11/2015  Referring physician: ED physician PCP: Marijean Bravo, MD  Specialists:   Chief Complaint: Cough, chest pain, shortness of breath and abdominal pain  HPI: Linda Ellison is a 40 y.o. female with PMH of ESRD-HD, hypertension, hyperlipidemia, diabetes mellitus, GERD, depression, diastolic congestive heart failure, CAD, PVD, peripheral neuropathy, who presents with cough, chest pain, shortness of breath and abdominal pain.  Patient reports that she has been having chest pain, shortness of breath, cough in the past 3 days. It is associated with nausea and dizziness. She coughs up yellow colored sputum. She has chills, and no fever. She also has bilateral calf pain in the past 2 weeks. Patient reports that she has moderate abdominal pain over upper abdomen, it is chronic, but worsened today. Currently is 8 out of 10 in severity. She has nausea, and vomited twice without blood in vomitus. Patient does not have diarrhea. Patient denies symptoms of UTI or unilateral weakness. She missed dialysis for whole week due to generalized sick feeling.  In ED, patient was found to have negative troponin, WBC 7.5, temperature normal, creatinine 11.89, bicarbonate 16, potassium 4.1, BUN 61. Chest x-ray showed left base infiltration. CT-abd/pelvis is negative. CTA of chest has no PE, but showed left lung consolidation with small pleural effusions. Patient is admitted to inpatient for further evaluation and treatment.   EKG: Independently reviewed. QTC 492, anteroseptal infarction pattern.  Where does patient live?   At home  Can patient participate in ADLs?  Little   Review of Systems:   General: no fevers, has chills, no changes in body weight, has poor appetite, has fatigue HEENT: no blurry vision, hearing changes or sore throat Pulm: has dyspnea, coughing, no wheezing CV: has chest pain, no  palpitations Abd: no nausea, vomiting, abdominal pain, no diarrhea, constipation GU: no dysuria, burning on urination, increased urinary frequency, hematuria  Ext: has leg edema Neuro: no unilateral weakness, numbness, or tingling, no vision change or hearing loss Skin: no rash MSK: No muscle spasm, no deformity, no limitation of range of movement in spin Heme: No easy bruising.  Travel history: No recent long distant travel.  Allergy:  Allergies  Allergen Reactions  . Ciprofloxacin Hives, Itching and Nausea And Vomiting  . Fluocinolone Itching, Nausea And Vomiting and Other (See Comments)  . Pineapple Itching and Other (See Comments)    Pt states that this medication makes her tongue raw.    . Strawberry Extract Itching and Other (See Comments)    Pt states that this medication makes her tongue raw.    . Adhesive [Tape] Itching and Rash  . Chlorhexidine Gluconate Itching  . Clindamycin Diarrhea, Nausea And Vomiting and Rash  . Oxycodone Nausea And Vomiting, Rash and Other (See Comments)    Reaction:  Hallucinations   . Shrimp [Shellfish Allergy] Rash    Past Medical History  Diagnosis Date  . Asthma   . ESRD on hemodialysis ([place])     started dialysis 01/29/11  . Weight loss   . Loss of appetite   . CHF (congestive heart failure) ([place])   . Hypertension     a. Also with hx of hypotension - has been started on midodrine.  . Hyperlipidemia   . Diabetes mellitus   . Peripheral neuropathy ([place])   . Iron deficiency anemia   . Hyperparathyroidism, secondary ([place])   . Sciatica   . GERD (gastroesophageal reflux disease)   .  Adenoma   . Gastroparesis   . Esophageal erosions     a. Per EGD 10/2011.  Marland Kitchen CAD (coronary artery disease)     a. False positive stress echo 06/2012 at [facility] - cath with mild nonobstructive CAD at [facility] (mild luminal irregularities in LAD, moderate diffuse disease in distal RCA).  . History of echocardiogram     Echo 9/14: EF 55-60%, normal wall motion,  normal diastolic function, PASP 21  . Chronic chest pain     a. H/o longstanding atypical CP. Nonobstructive cath 06/2012 and negative VQ.  Marland Kitchen ASCVD (arteriosclerotic cardiovascular disease)   . Anginal pain ([place])   . Peripheral vascular disease ([place])   . PONV (postoperative nausea and vomiting)   . Shortness of breath 07/12/2015  . Depression   . HCAP (healthcare-associated pneumonia) 07/12/2015    Past Surgical History  Procedure Laterality Date  . Cesarean section    . Cholecystectomy  1996  . Av fistula placement  08/2010    Left radiocephalic AVF  . Ligation goretex fistula  01/04/11    Left AVF  . Colonoscopy    . Esophagogastroduodenoscopy  10/19/2011    Dr. Silvano Rusk  . Foot surgery Left   . Tubal ligation    . Left heart catheterization with coronary angiogram N/A 06/27/2012    Procedure: LEFT HEART CATHETERIZATION WITH CORONARY ANGIOGRAM;  Surgeon: Larey Dresser, MD;  Location: [facility] CATH LAB;  Service: Cardiovascular;  Laterality: N/A;  . Fracture surgery Left     left femur  . Dilation and curettage of uterus    . Av fistula placement Right 05/07/2014    Procedure: ARTERIOVENOUS (AV) FISTULA CREATION;  Surgeon: Elam Dutch, MD;  Location: [place];  Service: Vascular;  Laterality: Right;  . Av fistula placement Right 07/02/2014    Procedure: INSERTION OF ARTERIOVENOUS (AV) GORE-TEX GRAFT ARM;  Surgeon: Elam Dutch, MD;  Location: [facility] OR;  Service: Vascular;  Laterality: Right;  . Thrombectomy and revision of arterioventous (av) goretex  graft Right 07/16/2014    Procedure: THROMBECTOMY  Right  arm  ARTERIOVENOUS  GORETEX  GRAFT;  Surgeon: Elam Dutch, MD;  Location: [place];  Service: Vascular;  Laterality: Right;  . Peripheral vascular catheterization Left 08/08/2014    Procedure: Upper Extremity Angiography;  Surgeon: Algernon Huxley, MD;  Location: [place] CV LAB;  Service: Cardiovascular;  Laterality: Left;  . Peripheral vascular catheterization Left 08/08/2014     Procedure: Upper Extremity Intervention;  Surgeon: Algernon Huxley, MD;  Location: [place] CV LAB;  Service: Cardiovascular;  Laterality: Left;  . Ligation of arteriovenous  fistula Left 08/21/2014    Procedure: LIGATION OF ARTERIOVENOUS  FISTULA;  Surgeon: Algernon Huxley, MD;  Location: [facility] ORS;  Service: Vascular;  Laterality: Left;    Social History:  reports that she has never smoked. She has never used smokeless tobacco. She reports that she does not drink alcohol or use illicit drugs.  Family History:  Family History  Problem Relation Age of Onset  . Heart disease Mother     heart attack at 34 y/o-infected valve  . Kidney disease Mother     was a dialysis patient  . Heart disease Brother   . Kidney disease Maternal Grandmother     pre-dialysis  . Kidney failure Paternal Uncle   . Kidney failure Paternal Aunt   . Diabetes Father   . Hypertension Father   . Colon cancer Neg Hx   .  Arthritis Brother   . Diabetes Maternal Aunt   . Hypertension Maternal Aunt   . Diabetes Paternal Aunt   . Heart disease Paternal Aunt   . Hypertension Paternal Aunt      Prior to Admission medications   Medication Sig Start Date End Date Taking? Authorizing Provider  albuterol (PROVENTIL HFA;VENTOLIN HFA) 108 (90 BASE) MCG/ACT inhaler Inhale 2 puffs into the lungs every 6 (six) hours as needed for wheezing or shortness of breath.   Yes Historical Provider, MD  amLODipine (NORVASC) 10 MG tablet Take 10 mg by mouth at bedtime.  06/13/14  Yes Historical Provider, MD  aspirin EC 81 MG tablet Take 81 mg by mouth daily.   Yes Historical Provider, MD  Calcium Carbonate-Vitamin D (CALCIUM 600+D) 600-400 MG-UNIT tablet Take 1 tablet by mouth daily.   Yes Historical Provider, MD  citalopram (CELEXA) 10 MG tablet Take 10 mg by mouth at bedtime.    Yes Historical Provider, MD  fluticasone (FLOVENT HFA) 110 MCG/ACT inhaler Inhale 2 puffs into the lungs 2 (two) times daily. 03/26/15  Yes Rolland Porter, MD  gabapentin  (NEURONTIN) 100 MG capsule Take 100-200 mg by mouth 2 (two) times daily. Pt takes one capsule in the morning and two at bedtime.   Yes Historical Provider, MD  HYDROcodone-acetaminophen (NORCO) 5-325 MG per tablet Take 1 tablet by mouth every 6 (six) hours as needed for moderate pain. 08/21/14  Yes Algernon Huxley, MD  hydrOXYzine (ATARAX/VISTARIL) 25 MG tablet Take 25 mg by mouth every 4 (four) hours as needed for itching.    Yes Historical Provider, MD  Insulin Detemir (LEVEMIR FLEXTOUCH) 100 UNIT/ML Pen Inject 22 Units into the skin every 12 (twelve) hours.    Yes Historical Provider, MD  labetalol (NORMODYNE) 300 MG tablet Take 300 mg by mouth See admin instructions. Pt takes one tablet at bedtime on Monday, Wednesday, and Friday.   All other days pt takes one tablet twice daily.   Yes Historical Provider, MD  linagliptin (TRADJENTA) 5 MG TABS tablet Take 5 mg by mouth at bedtime.   Yes Historical Provider, MD  methimazole (TAPAZOLE) 5 MG tablet Take 5 mg by mouth at bedtime.    Yes Historical Provider, MD  multivitamin (RENA-VIT) TABS tablet Take 1 tablet by mouth daily.   Yes Historical Provider, MD  omeprazole (PRILOSEC) 20 MG capsule Take 20 mg by mouth daily.   Yes Historical Provider, MD  ondansetron (ZOFRAN ODT) 4 MG disintegrating tablet Take 1 tablet (4 mg total) by mouth every 8 (eight) hours as needed for nausea or vomiting. 06/17/15  Yes Merryl Hacker, MD  sevelamer carbonate (RENVELA) 800 MG tablet Take 800-1,600 mg by mouth 5 (five) times daily. Pt takes two tablets three times daily with meals and one tablet two times daily with snacks.   Yes Historical Provider, MD  simvastatin (ZOCOR) 5 MG tablet Take 5 mg by mouth at bedtime.   Yes Historical Provider, MD  topiramate (TOPAMAX) 50 MG tablet Take 50 mg by mouth at bedtime.    Yes Historical Provider, MD  traMADol (ULTRAM) 50 MG tablet Take 1 tablet (50 mg total) by mouth every 6 (six) hours as needed. Patient not taking: Reported on  07/11/2015 02/09/15   Janne Napoleon, NP  traMADol-acetaminophen (ULTRACET) 37.5-325 MG tablet Take 2 tablets by mouth every 6 (six) hours as needed. Patient not taking: Reported on 07/11/2015 05/02/15   Charlesetta Shanks, MD    Physical Exam: Filed Vitals:  07/12/15 0142 07/12/15 0205 07/12/15 0403 07/12/15 0540  BP:  185/107 154/106 157/92  Pulse:   104 100  Temp:    98.5 F (36.9 C)  TempSrc:    Oral  Resp:   20 22  Height:    5\' 6"  (1.676 m)  Weight: 93.4 kg (205 lb 14.6 oz)   95.4 kg (210 lb 5.1 oz)  SpO2:   97% 98%   General: Not in acute distress HEENT:       Eyes: PERRL, EOMI, no scleral icterus.       ENT: No discharge from the ears and nose, no pharynx injection, no tonsillar enlargement.        Neck: No JVD, no bruit, no mass felt. Heme: No neck lymph node enlargement. Cardiac: S1/S2, RRR, No murmurs, No gallops or rubs. Pulm: has decreased air movement bilaterally, No rales, wheezing, rhonchi or rubs. Abd: Soft, nondistended, diffused tenderness, no rebound pain, no organomegaly, BS present. Ext: 1+ pitting leg edema bilaterally. 2+DP/PT pulse bilaterally. Has tenderness over bilateral calf areas. Musculoskeletal: No joint deformities, No joint redness or warmth, no limitation of ROM in spin. Skin: No rashes.  Neuro: Alert, oriented X3, cranial nerves II-XII grossly intact, moves all extremities normally. Psych: Patient is not psychotic, no suicidal or hemocidal ideation.  Labs on Admission:  Basic Metabolic Panel:  Recent Labs Lab 07/12/15 0020  NA 140  K 4.1  CL 109  CO2 16*  GLUCOSE 131*  BUN 61*  CREATININE 11.89*  CALCIUM 8.7*   Liver Function Tests:  Recent Labs Lab 07/12/15 0020  AST 12*  ALT 12*  ALKPHOS 62  BILITOT 0.2*  PROT 6.7  ALBUMIN 3.3*    Recent Labs Lab 07/12/15 0020  LIPASE 45   No results for input(s): AMMONIA in the last 168 hours. CBC:  Recent Labs Lab 07/12/15 0010  WBC 7.5  NEUTROABS 5.1  HGB 10.6*  HCT 32.4*  MCV  83.9  PLT 246   Cardiac Enzymes:  Recent Labs Lab 07/12/15 0357  TROPONINI <0.03    BNP (last 3 results) No results for input(s): BNP in the last 8760 hours.  ProBNP (last 3 results) No results for input(s): PROBNP in the last 8760 hours.  CBG:  Recent Labs Lab 07/11/15 2334  GLUCAP 127*    Radiological Exams on Admission: Dg Chest 2 View  07/12/2015  CLINICAL DATA:  Centralized chest and back pain.History of asthma, CHF, diabetes, end-stage renal disease on dialysis. EXAM: CHEST  2 VIEW COMPARISON:  Chest radiograph June 17, 2015 FINDINGS: Cardiac silhouette is upper limits of normal in size, mediastinal silhouette is nonsuspicious. Increased interstitial markings with patchy LEFT lower lobe airspace opacity. No definite pleural effusion. No pneumothorax. Soft tissue planes included osseous structures are normal. IMPRESSION: Interstitial and LEFT lung base alveolar airspace opacities concerning for bronchopneumonia, or pulmonary edema. Electronically Signed   By: Elon Alas M.D.   On: 07/12/2015 01:10   Ct Angio Chest Pe W/cm &/or Wo Cm  07/12/2015  CLINICAL DATA:  Central left chest pain radiating to the mid back, onset yesterday evening. Nausea. Dialysis patient. EXAM: CT ANGIOGRAPHY CHEST CT ABDOMEN AND PELVIS WITH CONTRAST TECHNIQUE: Multidetector CT imaging of the chest was performed using the standard protocol during bolus administration of intravenous contrast. Multiplanar CT image reconstructions and MIPs were obtained to evaluate the vascular anatomy. Multidetector CT imaging of the abdomen and pelvis was performed using the standard protocol during bolus administration of intravenous contrast. CONTRAST:  100 mL Isovue 370 intravenous COMPARISON:  12/24/2011 FINDINGS: CTA CHEST FINDINGS Cardiovascular: There is good opacification of the pulmonary arteries. There is no pulmonary embolism. The thoracic aorta is normal in caliber and intact. Lungs: There is consolidation in  the left upper lobe and left lower lobe. There is mild patchy opacity in the right lower lobe. There is interlobular septal thickening. This could represent fluid overload with asymmetric alveolar edema. Infectious infiltrates cannot be excluded, particularly in the areas of left lung consolidation. Central airways: Patent Effusions: Small pleural effusions bilaterally. Lymphadenopathy: None Esophagus: Unremarkable Musculoskeletal: No significant abnormality CT ABDOMEN and PELVIS FINDINGS Hepatobiliary: Cholecystectomy. The liver and bile ducts are normal. Pancreas: Normal Spleen: Normal Adrenals/Urinary Tract: Both adrenals are normal. The kidneys are mildly atrophic but otherwise normal except for a few punctate renal vascular calcifications. Ureters and urinary bladder are unremarkable. Stomach/Bowel: There are normal appearances of the stomach, small bowel and colon. The appendix is normal. Vascular/Lymphatic: The abdominal aorta is normal in caliber. There is no atherosclerotic calcification. There is no adenopathy in the abdomen or pelvis. Reproductive: Unremarkable uterus and ovaries. Other: Small volume ascites. No acute inflammatory changes are evident in the abdomen or pelvis. Musculoskeletal: No significant abnormality. Small fat containing umbilical hernia. Review of the MIP images confirms the above findings. IMPRESSION: 1. Negative for acute pulmonary embolus. 2. Left lung consolidation. Cannot exclude pneumonia. However there also is a interstitial fluid, and this could represent fluid overload with asymmetric alveolar edema. 3. Small pleural effusions. 4. No acute findings are evident in the abdomen or pelvis. Electronically Signed   By: Andreas Newport M.D.   On: 07/12/2015 03:44   Ct Abdomen Pelvis W Contrast  07/12/2015  CLINICAL DATA:  Central left chest pain radiating to the mid back, onset yesterday evening. Nausea. Dialysis patient. EXAM: CT ANGIOGRAPHY CHEST CT ABDOMEN AND PELVIS WITH  CONTRAST TECHNIQUE: Multidetector CT imaging of the chest was performed using the standard protocol during bolus administration of intravenous contrast. Multiplanar CT image reconstructions and MIPs were obtained to evaluate the vascular anatomy. Multidetector CT imaging of the abdomen and pelvis was performed using the standard protocol during bolus administration of intravenous contrast. CONTRAST:  100 mL Isovue 370 intravenous COMPARISON:  12/24/2011 FINDINGS: CTA CHEST FINDINGS Cardiovascular: There is good opacification of the pulmonary arteries. There is no pulmonary embolism. The thoracic aorta is normal in caliber and intact. Lungs: There is consolidation in the left upper lobe and left lower lobe. There is mild patchy opacity in the right lower lobe. There is interlobular septal thickening. This could represent fluid overload with asymmetric alveolar edema. Infectious infiltrates cannot be excluded, particularly in the areas of left lung consolidation. Central airways: Patent Effusions: Small pleural effusions bilaterally. Lymphadenopathy: None Esophagus: Unremarkable Musculoskeletal: No significant abnormality CT ABDOMEN and PELVIS FINDINGS Hepatobiliary: Cholecystectomy. The liver and bile ducts are normal. Pancreas: Normal Spleen: Normal Adrenals/Urinary Tract: Both adrenals are normal. The kidneys are mildly atrophic but otherwise normal except for a few punctate renal vascular calcifications. Ureters and urinary bladder are unremarkable. Stomach/Bowel: There are normal appearances of the stomach, small bowel and colon. The appendix is normal. Vascular/Lymphatic: The abdominal aorta is normal in caliber. There is no atherosclerotic calcification. There is no adenopathy in the abdomen or pelvis. Reproductive: Unremarkable uterus and ovaries. Other: Small volume ascites. No acute inflammatory changes are evident in the abdomen or pelvis. Musculoskeletal: No significant abnormality. Small fat containing  umbilical hernia. Review of the MIP images confirms the above  findings. IMPRESSION: 1. Negative for acute pulmonary embolus. 2. Left lung consolidation. Cannot exclude pneumonia. However there also is a interstitial fluid, and this could represent fluid overload with asymmetric alveolar edema. 3. Small pleural effusions. 4. No acute findings are evident in the abdomen or pelvis. Electronically Signed   By: Andreas Newport M.D.   On: 07/12/2015 03:44    Assessment/Plan Principal Problem:   HCAP (healthcare-associated pneumonia) Active Problems:   GERD   Essential hypertension   ESRD on hemodialysis ([facility])   CAD (coronary artery disease)   Hypertension   Diabetes mellitus ([place])   Diabetic gastroparesis ([facility])   Shortness of breath   Chest pain   Abdominal pain   HCAP: Patient's chest pain is most likely caused by pneumonia. Due to bilateral calf pain and history of right leg DVT, CT angiogram was done, which was negative for pulm embolism, but showed pneumonia. Patient does not have fever or leukocytosis. Is not septic on admission. Lactate is 0.8. Hemodynamically stable  - Will admit to Telemetry Bed - IV Vancomycin and Zosyn - Mucinex for cough  - Albuterol Neb prn for SOB - Urine legionella and S. pneumococcal antigen - Follow up blood culture x2, sputum culture and respiratory virus panel, plus Flu pcr - will get Procalcitonin and trend lactic acid level per sepsis protocol - IVF: ns at 50 mL per hour of NS  - trop x 3 -LE venous doppler to r/o DVT  GERD: -Protonix  HTN: -Amlodipine, labetalol -IV hydralazine when necessary  ESRD on hemodialysis ([place]): missed HD for 1 week. Cre=11.89. -left message to renal for HD -continue home Renvela  CAD (coronary artery disease):  -ASA, Zocor and labetalol -Follow-up troponin 3 due to chest pain  DM-II: Last A1c 7.2 on 06/10/11, fairly controled. Patient is taking Levemir and Trajenta at home -will decrease levemir dose from 22  to 15 units bid -SSI -Check A1c  Abdominal pain: CT abdomen is negative for acute abnormalities. Likely due to diabetic gastroparesis -try Reglan -continue percoet -check lipase  DVT ppx: SQ Heparin   Code Status: Full code Family Communication: Yes, patient's husband at bed side Disposition Plan: Admit to inpatient   Date of Service 07/12/2015    Ivor Costa [facility] Pager [phone]  If 7PM-7AM, please contact night-coverage [email or web address] Password [facility] 07/12/2015, 8:13 AM

## 2015-07-12 NOTE — ED Notes (Signed)
Lasix not given by verbalized order from hospitalist dr. Porfirio Mylar

## 2015-07-12 NOTE — Progress Notes (Signed)
Pharmacy Antibiotic Note  Linda Ellison is a 40 y.o. female admitted on 07/11/2015 with pneumonia.  Patient presents with shortness of breath and chest pain.  PMH significant for CHF, HTN, DM, CAD, chronic chest pain and ESRD on HD.  Chest XRay concerning for pneumonia.  Pharmacy has been consulted for Vancomycin and Zosyn dosing.  Plan: Zosyn 3.375gm IV x 1 in ED Vancomycin 1500mg  IV x 1 in ED F/u with HD schedule  Weight: 205 lb 14.6 oz (93.4 kg)  Temp (24hrs), Avg:98.3 F (36.8 C), Min:97.9 F (36.6 C), Max:98.7 F (37.1 C)   Recent Labs Lab 07/12/15 0010 07/12/15 0020  WBC 7.5  --   CREATININE  --  11.89*    Estimated Creatinine Clearance: 7.3 mL/min (by C-G formula based on Cr of 11.89).    Allergies  Allergen Reactions  . Ciprofloxacin Hives, Itching and Nausea And Vomiting  . Fluocinolone Itching, Nausea And Vomiting and Other (See Comments)  . Pineapple Itching and Other (See Comments)    Pt states that this medication makes her tongue raw.    . Strawberry Extract Itching and Other (See Comments)    Pt states that this medication makes her tongue raw.    . Adhesive [Tape] Itching and Rash  . Chlorhexidine Gluconate Itching  . Clindamycin Diarrhea, Nausea And Vomiting and Rash  . Oxycodone Nausea And Vomiting, Rash and Other (See Comments)    Reaction:  Hallucinations   . Shrimp [Shellfish Allergy] Rash    Antimicrobials this admission: 4/8 Vanc >>   4/8 Zosyn >>    Dose adjustments this admission:    Microbiology results: 4/8 BCx:   Thank you for allowing pharmacy to be a part of this patient's care.  Everette Rank, PharmD 07/12/2015 1:49 AM

## 2015-07-12 NOTE — Plan of Care (Signed)
Problem: Safety: Goal: Ability to remain free from injury will improve Outcome: Completed/Met Date Met:  07/12/15 Pt educated on safety measures put into place. Pt verbalized understanding.      

## 2015-07-12 NOTE — ED Notes (Signed)
Main lab requested for blood cultures will hang antibiotics after blood cultures are obtained.  Called and confirmed.

## 2015-07-13 ENCOUNTER — Inpatient Hospital Stay (HOSPITAL_COMMUNITY): Payer: Medicare Other

## 2015-07-13 DIAGNOSIS — M79661 Pain in right lower leg: Secondary | ICD-10-CM

## 2015-07-13 DIAGNOSIS — N186 End stage renal disease: Secondary | ICD-10-CM

## 2015-07-13 DIAGNOSIS — R0602 Shortness of breath: Secondary | ICD-10-CM

## 2015-07-13 DIAGNOSIS — M79662 Pain in left lower leg: Secondary | ICD-10-CM

## 2015-07-13 DIAGNOSIS — I10 Essential (primary) hypertension: Secondary | ICD-10-CM

## 2015-07-13 DIAGNOSIS — Z992 Dependence on renal dialysis: Secondary | ICD-10-CM

## 2015-07-13 LAB — GLUCOSE, CAPILLARY
GLUCOSE-CAPILLARY: 202 mg/dL — AB (ref 65–99)
GLUCOSE-CAPILLARY: 207 mg/dL — AB (ref 65–99)
GLUCOSE-CAPILLARY: 91 mg/dL (ref 65–99)
Glucose-Capillary: 85 mg/dL (ref 65–99)
Glucose-Capillary: 179 mg/dL — ABNORMAL HIGH (ref 65–99)

## 2015-07-13 MED ORDER — HYDRALAZINE HCL 25 MG PO TABS: 25.0000 mg | ORAL_TABLET | Freq: Three times a day (TID) | ORAL | Status: DC

## 2015-07-13 MED ORDER — LABETALOL HCL 200 MG PO TABS: 200.0000 mg | ORAL_TABLET | ORAL | Status: DC

## 2015-07-13 MED ORDER — ACETAMINOPHEN 325 MG PO TABS
650.0000 mg | ORAL_TABLET | Freq: Four times a day (QID) | ORAL | Status: DC | PRN
  Administered 2015-07-13 – 2015-07-14 (×2): 650 mg via ORAL
  Filled 2015-07-13 (×2): qty 2

## 2015-07-13 MED ORDER — AMLODIPINE BESYLATE 5 MG PO TABS
5.0000 mg | ORAL_TABLET | Freq: Every day | ORAL | Status: DC
  Administered 2015-07-13: 5 mg via ORAL
  Filled 2015-07-13: qty 1

## 2015-07-13 NOTE — Progress Notes (Signed)
OT Cancellation Note  Patient Details Name: ALBERTO DONG MRN: [id number] DOB: [birth year]   Cancelled Treatment:    Reason Eval/Treat Not Completed: Patient at procedure or test/ unavailable (Will follow.)  Malka So 07/13/2015, 12:09 PM

## 2015-07-13 NOTE — Evaluation (Signed)
Occupational Therapy Evaluation and Discharge Patient Details Name: Linda Ellison MRN: [id number] DOB: 08/26/1975 Today's Date: 07/13/2015    History of Present Illness pt presents with Chest Pain and SOB, with pt being worked up for Flu.  pt with hx of ESRD, HTN, DM, Depression, HF, CAD, PVD, Peripheral Neuropathy, and L Femur fx.   Clinical Impression   Pt was independent prior to admission. Presents with decreased activity tolerance. She is functioning at a set up to supervision level in basic ADL and ADL transfers. Educated in energy conservation. No further OT needs.    Follow Up Recommendations  No OT follow up    Equipment Recommendations  None recommended by OT    Recommendations for Other Services       Precautions / Restrictions Precautions Precautions:  (droplet) Restrictions Weight Bearing Restrictions: No      Mobility Bed Mobility Overal bed mobility: Modified Independent                Transfers   Equipment used: None Transfers: Sit to/from Stand Sit to Stand: Modified independent (Device/Increase time)         General transfer comment: uses UE from bed and toilet, no physical assist    Balance     Sitting balance-Leahy Scale: Good       Standing balance-Leahy Scale: Fair                              ADL Overall ADL's : Needs assistance/impaired Eating/Feeding: Independent;Sitting   Grooming: Wash/dry hands;Standing;Supervision/safety   Upper Body Bathing: Set up;Sitting   Lower Body Bathing: Set up;Sit to/from stand   Upper Body Dressing : Set up;Sitting   Lower Body Dressing: Set up;Sit to/from stand   Toilet Transfer: Supervision/safety;Ambulation;Comfort height toilet;Grab bars   Toileting- Clothing Manipulation and Hygiene: Supervision/safety;Sit to/from stand       Functional mobility during ADLs: Supervision/safety General ADL Comments: educated pt in benefits of seated showering for energy  conservation, may not be necessary by the time pt discharges     Vision     Perception     Praxis      Pertinent Vitals/Pain Pain Assessment: Faces Faces Pain Scale: Hurts a little bit Pain Location: base of L thumb, wrist Pain Intervention(s): Monitored during session (pt is followed by orthopedics outpatient)     Hand Dominance Right   Extremity/Trunk Assessment Upper Extremity Assessment Upper Extremity Assessment: Overall WFL for tasks assessed   Lower Extremity Assessment Lower Extremity Assessment: Overall WFL for tasks assessed   Cervical / Trunk Assessment Cervical / Trunk Assessment: Normal   Communication Communication Communication: No difficulties   Cognition Arousal/Alertness: Awake/alert Behavior During Therapy: Flat affect Overall Cognitive Status: Within Functional Limits for tasks assessed                     General Comments       Exercises       Shoulder Instructions      Home Living Family/patient expects to be discharged to:: Private residence Living Arrangements: Children Available Help at Discharge: Family;Available PRN/intermittently Type of Home: Apartment Home Access: Level entry     Home Layout: One level     Bathroom Shower/Tub: Teacher, early years/pre: Standard     Home Equipment: None   Additional Comments: pt's son is in his 34's and daughter is a teenager.  Both can help some, but not provide  extensive A.        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    OT Diagnosis: Generalized weakness   OT Problem List:     OT Treatment/Interventions:      OT Goals(Current goals can be found in the care plan section) Acute Rehab OT Goals Patient Stated Goal: Back to work  OT Frequency:     Barriers to D/C:            Co-evaluation              End of Session    Activity Tolerance: Patient limited by fatigue Patient  left: in bed;with call bell/phone within reach;with family/visitor present   Time: 1246-1310 OT Time Calculation (min): 24 min Charges:  OT General Charges $OT Visit: 1 Procedure OT Evaluation $OT Eval Low Complexity: 1 Procedure OT Treatments $Self Care/Home Management : 8-22 mins G-Codes:    Malka So 07/13/2015, 1:15 PM  [phone]

## 2015-07-13 NOTE — Progress Notes (Addendum)
  [facility] Progress Note   Subjective: still some slight SOB, no cough. Flu neg. CXR f/u today is clear after HD yest  Filed Vitals:   07/13/15 0122 07/13/15 0525 07/13/15 0747 07/13/15 1003  BP: 145/82 142/95 158/92   Pulse: 94 99 99 96  Temp:  97.9 F (36.6 C) 97.7 F (36.5 C)   TempSrc:  Oral Oral   Resp: 14 20 23 18   Height:      Weight:  93.9 kg (207 lb 0.2 oz)    SpO2: 97% 99% 98% 100%    Inpatient medications: . amLODipine  10 mg Oral QHS  . aspirin  325 mg Oral Daily  . budesonide (PULMICORT) nebulizer solution  1 mg Nebulization BID  . calcium-vitamin D  1 tablet Oral Daily  . citalopram  10 mg Oral QHS  . dextromethorphan-guaiFENesin  1 tablet Oral BID  . [START ON 07/14/2015] doxercalciferol  10 mcg Intravenous Q M,W,F-HD  . feeding supplement  1 Container Oral BID BM  . gabapentin  100 mg Oral Daily  . gabapentin  200 mg Oral QHS  . heparin  5,000 Units Subcutaneous 3 times per day  . hydrALAZINE  25 mg Oral 3 times per day  . insulin aspart  0-9 Units Subcutaneous TID WC  . insulin detemir  15 Units Subcutaneous BID  . [START ON 07/14/2015] labetalol  300 mg Oral Q M,W,F-2000  . methimazole  5 mg Oral QHS  . multivitamin  1 tablet Oral QHS  . pantoprazole  40 mg Oral Daily  . sevelamer carbonate  1,600 mg Oral TID WC  . simvastatin  5 mg Oral QHS  . topiramate  50 mg Oral QHS     albuterol, hydrALAZINE, hydrOXYzine, metoCLOPramide, ondansetron, sevelamer carbonate  Exam: Alert, no distress No jvd or bruits Chest clear bilat RRR no MRG Abd soft ntnd no mass or ascites +bs Ext trace pretib edema, improved Neuro is alert, Ox 3 , nf L forearm AVF +bruit   Dialysis:MWF [facility] 3h 48min 93.5kg 2/2.25 bath Heparin 9000 LFA AVF Mircera 75 ug q2, last 3.27 > 9.7/ 26% / -- Hect 10 ug / RV 800 2ac tid > 8.6/ 4.8/ 331  Assessment: 1. Cough/ SOB/ CP - cxr clear now consistent w pulm edema, not PNA. Off abx. Plan additional UF w HD  in am, try to lower dry wt. Flu swab neg.  2. ESRD - missed last HD x 3 3. Vol - as above 4. HTN - dc hydralazine and decrease BP meds to make room for more UF w HD mon 5. DM2 on insulin 6. Abd pain - CT abd neg  Plan - as above, prob ready for dc after HD Monday.    Kelly Splinter MD [facility] pager [phone]    cell [phone] 07/13/2015, 2:05 PM    Recent Labs Lab 07/12/15 0020  NA 140  K 4.1  CL 109  CO2 16*  GLUCOSE 131*  BUN 61*  CREATININE 11.89*  CALCIUM 8.7*    Recent Labs Lab 07/12/15 0020  AST 12*  ALT 12*  ALKPHOS 62  BILITOT 0.2*  PROT 6.7  ALBUMIN 3.3*    Recent Labs Lab 07/12/15 0010  WBC 7.5  NEUTROABS 5.1  HGB 10.6*  HCT 32.4*  MCV 83.9  PLT 246

## 2015-07-13 NOTE — Progress Notes (Signed)
VASCULAR LAB PRELIMINARY  PRELIMINARY  PRELIMINARY  PRELIMINARY  Bilateral lower extremity venous duplex completed.    Preliminary report:  There is no DVT or SVT noted in the bilateral lower extremities.   Leolia Vinzant, RVT 07/13/2015, 9:11 AM

## 2015-07-13 NOTE — Progress Notes (Signed)
PROGRESS NOTE  Linda Ellison [id number] DOB: [birth year] DOA: 07/11/2015 PCP: Marijean Bravo, MD  Assessment/Plan: SOB due to missed HD sessions -had not been in > 1 week -no PNA on repeat x ray-- abx d/c'd -flu negative -renal plans dialysis in AM and then d/c after that  HTN -add hydralazine and continue norvasc and labetalol  ESRD -HD again in AM  Code Status: full Family Communication:  Disposition Plan:    Consultants:  renal  Procedures:      HPI/Subjective: Still with SOB at rest  Objective: Filed Vitals:   07/13/15 0747 07/13/15 1003  BP: 158/92   Pulse: 99 96  Temp: 97.7 F (36.5 C)   Resp: 23 18    Intake/Output Summary (Last 24 hours) at 07/13/15 1403 Last data filed at 07/12/15 2045  Gross per 24 hour  Intake      0 ml  Output   2875 ml  Net  -2875 ml   Filed Weights   07/12/15 0540 07/12/15 1640 07/13/15 0525  Weight: 95.4 kg (210 lb 5.1 oz) 96.7 kg (213 lb 3 oz) 93.9 kg (207 lb 0.2 oz)    Exam:   General:  sleepy  Cardiovascular: rrr  Respiratory: no wheezing  Abdomen: +BS, soft   Data Reviewed: Basic Metabolic Panel:  Recent Labs Lab 07/12/15 0020  NA 140  K 4.1  CL 109  CO2 16*  GLUCOSE 131*  BUN 61*  CREATININE 11.89*  CALCIUM 8.7*   Liver Function Tests:  Recent Labs Lab 07/12/15 0020  AST 12*  ALT 12*  ALKPHOS 62  BILITOT 0.2*  PROT 6.7  ALBUMIN 3.3*    Recent Labs Lab 07/12/15 0020  LIPASE 45   No results for input(s): AMMONIA in the last 168 hours. CBC:  Recent Labs Lab 07/12/15 0010  WBC 7.5  NEUTROABS 5.1  HGB 10.6*  HCT 32.4*  MCV 83.9  PLT 246   Cardiac Enzymes:  Recent Labs Lab 07/12/15 0357  TROPONINI <0.03   BNP (last 3 results) No results for input(s): BNP in the last 8760 hours.  ProBNP (last 3 results) No results for input(s): PROBNP in the last 8760 hours.  CBG:  Recent Labs Lab 07/12/15 0829 07/12/15 1212 07/12/15 2217 07/13/15 0747 07/13/15 1147   GLUCAP 112* 202* 204* 85 207*    No results found for this or any previous visit (from the past 240 hour(s)).   Studies: Dg Chest 2 View  07/13/2015  CLINICAL DATA:  X 4 days patient has had chest tightness across her chest and chest pain on the left side of her chest.HX asthma, CHF, CAD, ASCVD, HCAP, diabetes, av fistula placement EXAM: CHEST  2 VIEW COMPARISON:  07/12/2015 FINDINGS: Group hazy opacity in the left mid lung and in the lung bases has improved. Irregular interstitial thickening has also improved. There are minimal effusions, most evident on the left. No new lung opacities. Heart, mediastinum hila are unremarkable. Bony thorax is unremarkable. IMPRESSION: 1. There has been improvement in lung aeration since prior study. Given the relatively rapid improvement, the findings are likely due to improved pulmonary edema as opposed to pneumonia. No new abnormalities. Electronically Signed   By: Lajean Manes M.D.   On: 07/13/2015 12:20   Dg Chest 2 View  07/12/2015  CLINICAL DATA:  Centralized chest and back pain.History of asthma, CHF, diabetes, end-stage renal disease on dialysis. EXAM: CHEST  2 VIEW COMPARISON:  Chest radiograph June 17, 2015 FINDINGS: Cardiac silhouette is  upper limits of normal in size, mediastinal silhouette is nonsuspicious. Increased interstitial markings with patchy LEFT lower lobe airspace opacity. No definite pleural effusion. No pneumothorax. Soft tissue planes included osseous structures are normal. IMPRESSION: Interstitial and LEFT lung base alveolar airspace opacities concerning for bronchopneumonia, or pulmonary edema. Electronically Signed   By: Elon Alas M.D.   On: 07/12/2015 01:10   Ct Angio Chest Pe W/cm &/or Wo Cm  07/12/2015  CLINICAL DATA:  Central left chest pain radiating to the mid back, onset yesterday evening. Nausea. Dialysis patient. EXAM: CT ANGIOGRAPHY CHEST CT ABDOMEN AND PELVIS WITH CONTRAST TECHNIQUE: Multidetector CT imaging of the  chest was performed using the standard protocol during bolus administration of intravenous contrast. Multiplanar CT image reconstructions and MIPs were obtained to evaluate the vascular anatomy. Multidetector CT imaging of the abdomen and pelvis was performed using the standard protocol during bolus administration of intravenous contrast. CONTRAST:  100 mL Isovue 370 intravenous COMPARISON:  12/24/2011 FINDINGS: CTA CHEST FINDINGS Cardiovascular: There is good opacification of the pulmonary arteries. There is no pulmonary embolism. The thoracic aorta is normal in caliber and intact. Lungs: There is consolidation in the left upper lobe and left lower lobe. There is mild patchy opacity in the right lower lobe. There is interlobular septal thickening. This could represent fluid overload with asymmetric alveolar edema. Infectious infiltrates cannot be excluded, particularly in the areas of left lung consolidation. Central airways: Patent Effusions: Small pleural effusions bilaterally. Lymphadenopathy: None Esophagus: Unremarkable Musculoskeletal: No significant abnormality CT ABDOMEN and PELVIS FINDINGS Hepatobiliary: Cholecystectomy. The liver and bile ducts are normal. Pancreas: Normal Spleen: Normal Adrenals/Urinary Tract: Both adrenals are normal. The kidneys are mildly atrophic but otherwise normal except for a few punctate renal vascular calcifications. Ureters and urinary bladder are unremarkable. Stomach/Bowel: There are normal appearances of the stomach, small bowel and colon. The appendix is normal. Vascular/Lymphatic: The abdominal aorta is normal in caliber. There is no atherosclerotic calcification. There is no adenopathy in the abdomen or pelvis. Reproductive: Unremarkable uterus and ovaries. Other: Small volume ascites. No acute inflammatory changes are evident in the abdomen or pelvis. Musculoskeletal: No significant abnormality. Small fat containing umbilical hernia. Review of the MIP images confirms  the above findings. IMPRESSION: 1. Negative for acute pulmonary embolus. 2. Left lung consolidation. Cannot exclude pneumonia. However there also is a interstitial fluid, and this could represent fluid overload with asymmetric alveolar edema. 3. Small pleural effusions. 4. No acute findings are evident in the abdomen or pelvis. Electronically Signed   By: Andreas Newport M.D.   On: 07/12/2015 03:44   Ct Abdomen Pelvis W Contrast  07/12/2015  CLINICAL DATA:  Central left chest pain radiating to the mid back, onset yesterday evening. Nausea. Dialysis patient. EXAM: CT ANGIOGRAPHY CHEST CT ABDOMEN AND PELVIS WITH CONTRAST TECHNIQUE: Multidetector CT imaging of the chest was performed using the standard protocol during bolus administration of intravenous contrast. Multiplanar CT image reconstructions and MIPs were obtained to evaluate the vascular anatomy. Multidetector CT imaging of the abdomen and pelvis was performed using the standard protocol during bolus administration of intravenous contrast. CONTRAST:  100 mL Isovue 370 intravenous COMPARISON:  12/24/2011 FINDINGS: CTA CHEST FINDINGS Cardiovascular: There is good opacification of the pulmonary arteries. There is no pulmonary embolism. The thoracic aorta is normal in caliber and intact. Lungs: There is consolidation in the left upper lobe and left lower lobe. There is mild patchy opacity in the right lower lobe. There is interlobular septal thickening. This could  represent fluid overload with asymmetric alveolar edema. Infectious infiltrates cannot be excluded, particularly in the areas of left lung consolidation. Central airways: Patent Effusions: Small pleural effusions bilaterally. Lymphadenopathy: None Esophagus: Unremarkable Musculoskeletal: No significant abnormality CT ABDOMEN and PELVIS FINDINGS Hepatobiliary: Cholecystectomy. The liver and bile ducts are normal. Pancreas: Normal Spleen: Normal Adrenals/Urinary Tract: Both adrenals are normal. The  kidneys are mildly atrophic but otherwise normal except for a few punctate renal vascular calcifications. Ureters and urinary bladder are unremarkable. Stomach/Bowel: There are normal appearances of the stomach, small bowel and colon. The appendix is normal. Vascular/Lymphatic: The abdominal aorta is normal in caliber. There is no atherosclerotic calcification. There is no adenopathy in the abdomen or pelvis. Reproductive: Unremarkable uterus and ovaries. Other: Small volume ascites. No acute inflammatory changes are evident in the abdomen or pelvis. Musculoskeletal: No significant abnormality. Small fat containing umbilical hernia. Review of the MIP images confirms the above findings. IMPRESSION: 1. Negative for acute pulmonary embolus. 2. Left lung consolidation. Cannot exclude pneumonia. However there also is a interstitial fluid, and this could represent fluid overload with asymmetric alveolar edema. 3. Small pleural effusions. 4. No acute findings are evident in the abdomen or pelvis. Electronically Signed   By: Andreas Newport M.D.   On: 07/12/2015 03:44    Scheduled Meds: . amLODipine  10 mg Oral QHS  . aspirin  325 mg Oral Daily  . budesonide (PULMICORT) nebulizer solution  1 mg Nebulization BID  . calcium-vitamin D  1 tablet Oral Daily  . citalopram  10 mg Oral QHS  . dextromethorphan-guaiFENesin  1 tablet Oral BID  . [START ON 07/14/2015] doxercalciferol  10 mcg Intravenous Q M,W,F-HD  . feeding supplement  1 Container Oral BID BM  . gabapentin  100 mg Oral Daily  . gabapentin  200 mg Oral QHS  . heparin  5,000 Units Subcutaneous 3 times per day  . hydrALAZINE  25 mg Oral 3 times per day  . insulin aspart  0-9 Units Subcutaneous TID WC  . insulin detemir  15 Units Subcutaneous BID  . [START ON 07/14/2015] labetalol  300 mg Oral Q M,W,F-2000  . methimazole  5 mg Oral QHS  . multivitamin  1 tablet Oral QHS  . pantoprazole  40 mg Oral Daily  . sevelamer carbonate  1,600 mg Oral TID WC    . simvastatin  5 mg Oral QHS  . topiramate  50 mg Oral QHS   Continuous Infusions:  Antibiotics Given (last 72 hours)    None      Principal Problem:   HCAP (healthcare-associated pneumonia) Active Problems:   GERD   Essential hypertension   ESRD on hemodialysis (HCC)   CAD (coronary artery disease)   Hypertension   Diabetes mellitus ([place])   Diabetic gastroparesis ([place])   Shortness of breath   Chest pain   Abdominal pain    Time spent:25 min    [facility] Pager [phone]. If 7PM-7AM, please contact night-coverage at [email or web address], password [facility] 07/13/2015, 2:03 PM  LOS: 1 day

## 2015-07-14 ENCOUNTER — Inpatient Hospital Stay (HOSPITAL_COMMUNITY): Payer: Medicare Other

## 2015-07-14 DIAGNOSIS — E8770 Fluid overload, unspecified: Secondary | ICD-10-CM

## 2015-07-14 LAB — GLUCOSE, CAPILLARY
Glucose-Capillary: 121 mg/dL — ABNORMAL HIGH (ref 65–99)
Glucose-Capillary: 207 mg/dL — ABNORMAL HIGH (ref 65–99)
Glucose-Capillary: 167 mg/dL — ABNORMAL HIGH (ref 65–99)

## 2015-07-14 LAB — RENAL FUNCTION PANEL
Albumin: 2.9 g/dL — ABNORMAL LOW (ref 3.5–5.0)
Anion gap: 15 (ref 5–15)
BUN: 26 mg/dL — ABNORMAL HIGH (ref 6–20)
CO2: 22 mmol/L (ref 22–32)
Calcium: 8.8 mg/dL — ABNORMAL LOW (ref 8.9–10.3)
Chloride: 101 mmol/L (ref 101–111)
Creatinine, Ser: 8.25 mg/dL — ABNORMAL HIGH (ref 0.44–1.00)
GFR calc Af Amer: 6 mL/min — ABNORMAL LOW (ref >60)
GFR calc non Af Amer: 5 mL/min — ABNORMAL LOW (ref >60)
Glucose, Bld: 124 mg/dL — ABNORMAL HIGH (ref 65–99)
Phosphorus: 4.6 mg/dL (ref 2.5–4.6)
Potassium: 3.9 mmol/L (ref 3.5–5.1)
Sodium: 138 mmol/L (ref 135–145)

## 2015-07-14 LAB — CBC
HCT: 33.7 % — ABNORMAL LOW (ref 36.0–46.0)
HEMOGLOBIN: 10.8 g/dL — AB (ref 12.0–15.0)
MCH: 27.4 pg (ref 26.0–34.0)
MCHC: 32 g/dL (ref 30.0–36.0)
MCV: 85.5 fL (ref 78.0–100.0)
Platelets: 253 10*3/uL (ref 150–400)
RBC: 3.94 MIL/uL (ref 3.87–5.11)
RDW: 14.9 % (ref 11.5–15.5)
WBC: 6 10*3/uL (ref 4.0–10.5)

## 2015-07-14 MED ORDER — HEPARIN 1000 UNIT/ML FOR PERITONEAL DIALYSIS: 9000.0000 [IU] | Freq: Once | INTRAMUSCULAR | Status: AC

## 2015-07-14 MED ORDER — HYDROCODONE-ACETAMINOPHEN 5-325 MG PO TABS: 1.0000 | ORAL_TABLET | Freq: Four times a day (QID) | ORAL | Status: DC | PRN

## 2015-07-14 MED ORDER — HEPARIN SODIUM (PORCINE) 1000 UNIT/ML DIALYSIS: 1000.0000 [IU] | INTRAMUSCULAR | Status: DC | PRN

## 2015-07-14 MED ORDER — ALTEPLASE 2 MG IJ SOLR: 2.0000 mg | Freq: Once | INTRAMUSCULAR | Status: DC | PRN

## 2015-07-14 MED ORDER — LABETALOL HCL 200 MG PO TABS: 200.0000 mg | ORAL_TABLET | ORAL | Status: DC

## 2015-07-14 MED ORDER — DOXERCALCIFEROL 4 MCG/2ML IV SOLN
INTRAVENOUS | Status: AC
  Filled 2015-07-14: qty 6

## 2015-07-14 MED ORDER — LIDOCAINE HCL (PF) 1 % IJ SOLN: 5.0000 mL | INTRAMUSCULAR | Status: DC | PRN

## 2015-07-14 MED ORDER — LIDOCAINE-PRILOCAINE 2.5-2.5 % EX CREA
1.0000 "application " | TOPICAL_CREAM | CUTANEOUS | Status: DC | PRN
  Filled 2015-07-14: qty 5

## 2015-07-14 MED ORDER — HEPARIN SODIUM (PORCINE) 1000 UNIT/ML DIALYSIS
1000.0000 [IU] | INTRAMUSCULAR | Status: DC | PRN
  Filled 2015-07-14: qty 1

## 2015-07-14 MED ORDER — PENTAFLUOROPROP-TETRAFLUOROETH EX AERO: 1.0000 "application " | INHALATION_SPRAY | CUTANEOUS | Status: DC | PRN

## 2015-07-14 MED ORDER — DARBEPOETIN ALFA 60 MCG/0.3ML IJ SOSY: 60.0000 ug | PREFILLED_SYRINGE | INTRAMUSCULAR | Status: DC

## 2015-07-14 MED ORDER — AMLODIPINE BESYLATE 5 MG PO TABS
5.0000 mg | ORAL_TABLET | Freq: Every day | ORAL | Status: DC
Start: 2015-07-14 — End: 2015-09-11

## 2015-07-14 MED ORDER — HEPARIN SODIUM (PORCINE) 1000 UNIT/ML DIALYSIS
6000.0000 [IU] | Freq: Once | INTRAMUSCULAR | Status: DC
  Filled 2015-07-14: qty 6

## 2015-07-14 MED ORDER — SODIUM CHLORIDE 0.9 % IV SOLN: 100.0000 mL | INTRAVENOUS | Status: DC | PRN

## 2015-07-14 MED ORDER — TRAMADOL HCL 50 MG PO TABS: 50.0000 mg | ORAL_TABLET | Freq: Four times a day (QID) | ORAL | Status: DC | PRN

## 2015-07-14 MED ORDER — HEPARIN SODIUM (PORCINE) 1000 UNIT/ML DIALYSIS
9000.0000 [IU] | Freq: Once | INTRAMUSCULAR | Status: AC
  Administered 2015-07-14: 9000 [IU] via INTRAVENOUS_CENTRAL

## 2015-07-14 MED ORDER — HEPARIN SODIUM (PORCINE) 1000 UNIT/ML DIALYSIS: 9000.0000 [IU] | Freq: Once | INTRAMUSCULAR | Status: DC

## 2015-07-14 MED ORDER — LIDOCAINE-PRILOCAINE 2.5-2.5 % EX CREA: 1.0000 "application " | TOPICAL_CREAM | CUTANEOUS | Status: DC | PRN

## 2015-07-14 NOTE — Progress Notes (Signed)
Subjective: Interval History: has no complaint, breathing better.  Objective: Vital signs in last 24 hours: Temp:  [97.7 F (36.5 C)-98.4 F (36.9 C)] 98.2 F (36.8 C) (04/10 0431) Pulse Rate:  [96-101] 100 (04/10 0431) Resp:  [18-23] 20 (04/09 1424) BP: (140-158)/(82-92) 143/89 mmHg (04/10 0431) SpO2:  [96 %-100 %] 97 % (04/10 0431) Weight:  [94.756 kg (208 lb 14.4 oz)] 94.756 kg (208 lb 14.4 oz) (04/10 0431) Weight change: -1.944 kg (-4 lb 4.6 oz)  Intake/Output from previous day: 04/09 0701 - 04/10 0700 In: 1200 [P.O.:1200] Out: 650 [Urine:650] Intake/Output this shift:    General appearance: alert, cooperative, no distress and moderately obese Resp: clear to auscultation bilaterally Cardio: S1, S2 normal and systolic murmur: holosystolic 2/6, blowing at apex GI: obese, pos bs Extremities: edema Tr  Lab Results:  Recent Labs  07/12/15 0010  WBC 7.5  HGB 10.6*  HCT 32.4*  PLT 246   BMET:  Recent Labs  07/12/15 0020  NA 140  K 4.1  CL 109  CO2 16*  GLUCOSE 131*  BUN 61*  CREATININE 11.89*  CALCIUM 8.7*   No results for input(s): PTH in the last 72 hours. Iron Studies: No results for input(s): IRON, TIBC, TRANSFERRIN, FERRITIN in the last 72 hours.  Studies/Results: Dg Chest 2 View  07/13/2015  CLINICAL DATA:  X 4 days patient has had chest tightness across her chest and chest pain on the left side of her chest.HX asthma, CHF, CAD, ASCVD, HCAP, diabetes, av fistula placement EXAM: CHEST  2 VIEW COMPARISON:  07/12/2015 FINDINGS: Group hazy opacity in the left mid lung and in the lung bases has improved. Irregular interstitial thickening has also improved. There are minimal effusions, most evident on the left. No new lung opacities. Heart, mediastinum hila are unremarkable. Bony thorax is unremarkable. IMPRESSION: 1. There has been improvement in lung aeration since prior study. Given the relatively rapid improvement, the findings are likely due to improved  pulmonary edema as opposed to pneumonia. No new abnormalities. Electronically Signed   By: Lajean Manes M.D.   On: 07/13/2015 12:20    I have reviewed the patient's current medications.  Assessment/Plan: 1 ESRD for HD MWF 2 HTN lower vol,lower meds 3 DM controlled 4 Obesity 5 NONADHERENCE primary issue 6 CAD 7 ANemia esa 8 HPHT P HD,esa, lower meds.    LOS: 2 days   Kaylob Wallen L 07/14/2015,7:34 AM

## 2015-07-14 NOTE — Progress Notes (Signed)
PT Cancellation Note  Patient Details Name: Linda Ellison MRN: [id number] DOB: 01/31/1976   Cancelled Treatment:    Reason Eval/Treat Not Completed: Patient at procedure or test/unavailable.  Pt at HD this am.  Will f/u another time.     Herman Mell, Thornton Papas 07/14/2015, 8:29 AM

## 2015-07-14 NOTE — Discharge Summary (Addendum)
Physician Discharge Summary  Linda Ellison [id number] DOB: 1975-11-16 DOA: 07/11/2015  PCP: Marijean Bravo, MD  Admit date: 07/11/2015 Discharge date: 07/14/2015  Time spent: 35 minutes  Recommendations for Outpatient Follow-up:  1. Stressed compliance with HD- 3 x/week 2. Decrease BP meds as per recommendations by nephro-- called and updated patient that 2 new scripts for lower BP meds were sent to [facility]   Discharge Diagnoses:  Active Problems:   GERD   Essential hypertension   ESRD on hemodialysis (HCC)   CAD (coronary artery disease)   Hypertension   Diabetes mellitus ([place])   Diabetic gastroparesis (HCC)   Shortness of breath   Chest pain   Abdominal pain   Discharge Condition: improved  Diet recommendation: renal/carb mod  Filed Weights   07/14/15 0431 07/14/15 0820 07/14/15 1220  Weight: 94.756 kg (208 lb 14.4 oz) 95.4 kg (210 lb 5.1 oz) 91.1 kg (200 lb 13.4 oz)    History of present illness:  Linda Ellison is a 40 y.o. female with PMH of ESRD-HD, hypertension, hyperlipidemia, diabetes mellitus, GERD, depression, diastolic congestive heart failure, CAD, PVD, peripheral neuropathy, who presents with cough, chest pain, shortness of breath and abdominal pain.  Patient reports that she has been having chest pain, shortness of breath, cough in the past 3 days. It is associated with nausea and dizziness. She coughs up yellow colored sputum. She has chills, and no fever. She also has bilateral calf pain in the past 2 weeks. Patient reports that she has moderate abdominal pain over upper abdomen, it is chronic, but worsened today. Currently is 8 out of 10 in severity. She has nausea, and vomited twice without blood in vomitus. Patient does not have diarrhea. Patient denies symptoms of UTI or unilateral weakness. She missed dialysis for whole week due to generalized sick feeling.  In ED, patient was found to have negative troponin, WBC 7.5, temperature normal, creatinine  11.89, bicarbonate 16, potassium 4.1, BUN 61. Chest x-ray showed left base infiltration. CT-abd/pelvis is negative. CTA of chest has no PE, but showed left lung consolidation with small pleural effusions. Patient is admitted to inpatient for further evaluation and treatment.   Hospital Course:  SOB due to missed HD sessions -had not been in > 1 week -no PNA on repeat x ray-- abx d/c'd -flu negative  HTN -continue meds at lower dose -prescriptions sent to [facility]  ESRD -HD x 2 in hospital -stressed complainace  Left knee pain -? Reactive arthritis from recent diarrhea -not warm- does not appear infected/nor gout   Procedures:  HD x2  Consultations:  renal  Discharge Exam: Filed Vitals:   07/14/15 1220 07/14/15 1328  BP: 155/92 140/92  Pulse: 99 106  Temp: 97.6 F (36.4 C) 98.4 F (36.9 C)  Resp: 18       Discharge Instructions   Discharge Instructions    Discharge instructions    Complete by:  As directed   Renal diet     Increase activity slowly    Complete by:  As directed           Current Discharge Medication List    CONTINUE these medications which have CHANGED   Details  amLODipine (NORVASC) 5 MG tablet Take 1 tablet (5 mg total) by mouth at bedtime.    HYDROcodone-acetaminophen (NORCO) 5-325 MG tablet Take 1 tablet by mouth every 6 (six) hours as needed for moderate pain. Qty: 15 tablet, Refills: 0    labetalol (NORMODYNE) 200 MG tablet  Take 1 tablet (200 mg total) by mouth every Monday, Wednesday, and Friday at 8 PM. Qty: 15 tablet, Refills: 0      CONTINUE these medications which have NOT CHANGED   Details  albuterol (PROVENTIL HFA;VENTOLIN HFA) 108 (90 BASE) MCG/ACT inhaler Inhale 2 puffs into the lungs every 6 (six) hours as needed for wheezing or shortness of breath.    aspirin EC 81 MG tablet Take 81 mg by mouth daily.    Calcium Carbonate-Vitamin D (CALCIUM 600+D) 600-400 MG-UNIT tablet Take 1 tablet by mouth daily.    citalopram  (CELEXA) 10 MG tablet Take 10 mg by mouth at bedtime.     fluticasone (FLOVENT HFA) 110 MCG/ACT inhaler Inhale 2 puffs into the lungs 2 (two) times daily. Qty: 1 Inhaler, Refills: 0    gabapentin (NEURONTIN) 100 MG capsule Take 100-200 mg by mouth 2 (two) times daily. Pt takes one capsule in the morning and two at bedtime.    hydrOXYzine (ATARAX/VISTARIL) 25 MG tablet Take 25 mg by mouth every 4 (four) hours as needed for itching.     Insulin Detemir (LEVEMIR FLEXTOUCH) 100 UNIT/ML Pen Inject 22 Units into the skin every 12 (twelve) hours.     linagliptin (TRADJENTA) 5 MG TABS tablet Take 5 mg by mouth at bedtime.    methimazole (TAPAZOLE) 5 MG tablet Take 5 mg by mouth at bedtime.     multivitamin (RENA-VIT) TABS tablet Take 1 tablet by mouth daily.    omeprazole (PRILOSEC) 20 MG capsule Take 20 mg by mouth daily.    ondansetron (ZOFRAN ODT) 4 MG disintegrating tablet Take 1 tablet (4 mg total) by mouth every 8 (eight) hours as needed for nausea or vomiting. Qty: 20 tablet, Refills: 0    sevelamer carbonate (RENVELA) 800 MG tablet Take 800-1,600 mg by mouth 5 (five) times daily. Pt takes two tablets three times daily with meals and one tablet two times daily with snacks.    simvastatin (ZOCOR) 5 MG tablet Take 5 mg by mouth at bedtime.    topiramate (TOPAMAX) 50 MG tablet Take 50 mg by mouth at bedtime.       STOP taking these medications     traMADol (ULTRAM) 50 MG tablet      traMADol-acetaminophen (ULTRACET) 37.5-325 MG tablet        Allergies  Allergen Reactions  . Ciprofloxacin Hives, Itching and Nausea And Vomiting  . Fluocinolone Itching, Nausea And Vomiting and Other (See Comments)  . Pineapple Itching and Other (See Comments)    Pt states that this medication makes her tongue raw.    . Strawberry Extract Itching and Other (See Comments)    Pt states that this medication makes her tongue raw.    . Adhesive [Tape] Itching and Rash  . Chlorhexidine Gluconate  Itching  . Clindamycin Diarrhea, Nausea And Vomiting and Rash  . Oxycodone Nausea And Vomiting, Rash and Other (See Comments)    Reaction:  Hallucinations   . Shrimp [Shellfish Allergy] Rash   Follow-up Information    Follow up with TALBOT, DAVID C, MD In 1 week.   Specialty:  Internal Medicine   Contact information:   [address] [place] [address] [phone]       Please follow up.   Why:  HD M/W/F       The results of significant diagnostics from this hospitalization (including imaging, microbiology, ancillary and laboratory) are listed below for reference.    Significant Diagnostic Studies:  Dg Chest 2 View  07/13/2015  CLINICAL DATA:  X 4 days patient has had chest tightness across her chest and chest pain on the left side of her chest.HX asthma, CHF, CAD, ASCVD, HCAP, diabetes, av fistula placement EXAM: CHEST  2 VIEW COMPARISON:  07/12/2015 FINDINGS: Group hazy opacity in the left mid lung and in the lung bases has improved. Irregular interstitial thickening has also improved. There are minimal effusions, most evident on the left. No new lung opacities. Heart, mediastinum hila are unremarkable. Bony thorax is unremarkable. IMPRESSION: 1. There has been improvement in lung aeration since prior study. Given the relatively rapid improvement, the findings are likely due to improved pulmonary edema as opposed to pneumonia. No new abnormalities. Electronically Signed   By: Lajean Manes M.D.   On: 07/13/2015 12:20   Dg Chest 2 View  07/12/2015  CLINICAL DATA:  Centralized chest and back pain.History of asthma, CHF, diabetes, end-stage renal disease on dialysis. EXAM: CHEST  2 VIEW COMPARISON:  Chest radiograph June 17, 2015 FINDINGS: Cardiac silhouette is upper limits of normal in size, mediastinal silhouette is nonsuspicious. Increased interstitial markings with patchy LEFT lower lobe airspace opacity. No definite pleural effusion. No pneumothorax. Soft tissue planes  included osseous structures are normal. IMPRESSION: Interstitial and LEFT lung base alveolar airspace opacities concerning for bronchopneumonia, or pulmonary edema. Electronically Signed   By: Elon Alas M.D.   On: 07/12/2015 01:10   Dg Knee 1-2 Views Left  07/14/2015  CLINICAL DATA:  Left knee pain and swelling for 2 weeks without known injury. EXAM: LEFT KNEE - 1-2 VIEW COMPARISON:  June 19, 2014. FINDINGS: There is no evidence of fracture, dislocation, or joint effusion. There is no evidence of arthropathy or other focal bone abnormality. Soft tissues are unremarkable. IMPRESSION: Normal left knee. Electronically Signed   By: Marijo Conception, M.D.   On: 07/14/2015 16:14   Ct Angio Chest Pe W/cm &/or Wo Cm  07/12/2015  CLINICAL DATA:  Central left chest pain radiating to the mid back, onset yesterday evening. Nausea. Dialysis patient. EXAM: CT ANGIOGRAPHY CHEST CT ABDOMEN AND PELVIS WITH CONTRAST TECHNIQUE: Multidetector CT imaging of the chest was performed using the standard protocol during bolus administration of intravenous contrast. Multiplanar CT image reconstructions and MIPs were obtained to evaluate the vascular anatomy. Multidetector CT imaging of the abdomen and pelvis was performed using the standard protocol during bolus administration of intravenous contrast. CONTRAST:  100 mL Isovue 370 intravenous COMPARISON:  12/24/2011 FINDINGS: CTA CHEST FINDINGS Cardiovascular: There is good opacification of the pulmonary arteries. There is no pulmonary embolism. The thoracic aorta is normal in caliber and intact. Lungs: There is consolidation in the left upper lobe and left lower lobe. There is mild patchy opacity in the right lower lobe. There is interlobular septal thickening. This could represent fluid overload with asymmetric alveolar edema. Infectious infiltrates cannot be excluded, particularly in the areas of left lung consolidation. Central airways: Patent Effusions: Small pleural  effusions bilaterally. Lymphadenopathy: None Esophagus: Unremarkable Musculoskeletal: No significant abnormality CT ABDOMEN and PELVIS FINDINGS Hepatobiliary: Cholecystectomy. The liver and bile ducts are normal. Pancreas: Normal Spleen: Normal Adrenals/Urinary Tract: Both adrenals are normal. The kidneys are mildly atrophic but otherwise normal except for a few punctate renal vascular calcifications. Ureters and urinary bladder are unremarkable. Stomach/Bowel: There are normal appearances of the stomach, small bowel and colon. The appendix is normal. Vascular/Lymphatic: The abdominal aorta is normal in caliber. There is no atherosclerotic calcification. There is no  adenopathy in the abdomen or pelvis. Reproductive: Unremarkable uterus and ovaries. Other: Small volume ascites. No acute inflammatory changes are evident in the abdomen or pelvis. Musculoskeletal: No significant abnormality. Small fat containing umbilical hernia. Review of the MIP images confirms the above findings. IMPRESSION: 1. Negative for acute pulmonary embolus. 2. Left lung consolidation. Cannot exclude pneumonia. However there also is a interstitial fluid, and this could represent fluid overload with asymmetric alveolar edema. 3. Small pleural effusions. 4. No acute findings are evident in the abdomen or pelvis. Electronically Signed   By: Andreas Newport M.D.   On: 07/12/2015 03:44   Ct Abdomen Pelvis W Contrast  07/12/2015  CLINICAL DATA:  Central left chest pain radiating to the mid back, onset yesterday evening. Nausea. Dialysis patient. EXAM: CT ANGIOGRAPHY CHEST CT ABDOMEN AND PELVIS WITH CONTRAST TECHNIQUE: Multidetector CT imaging of the chest was performed using the standard protocol during bolus administration of intravenous contrast. Multiplanar CT image reconstructions and MIPs were obtained to evaluate the vascular anatomy. Multidetector CT imaging of the abdomen and pelvis was performed using the standard protocol during bolus  administration of intravenous contrast. CONTRAST:  100 mL Isovue 370 intravenous COMPARISON:  12/24/2011 FINDINGS: CTA CHEST FINDINGS Cardiovascular: There is good opacification of the pulmonary arteries. There is no pulmonary embolism. The thoracic aorta is normal in caliber and intact. Lungs: There is consolidation in the left upper lobe and left lower lobe. There is mild patchy opacity in the right lower lobe. There is interlobular septal thickening. This could represent fluid overload with asymmetric alveolar edema. Infectious infiltrates cannot be excluded, particularly in the areas of left lung consolidation. Central airways: Patent Effusions: Small pleural effusions bilaterally. Lymphadenopathy: None Esophagus: Unremarkable Musculoskeletal: No significant abnormality CT ABDOMEN and PELVIS FINDINGS Hepatobiliary: Cholecystectomy. The liver and bile ducts are normal. Pancreas: Normal Spleen: Normal Adrenals/Urinary Tract: Both adrenals are normal. The kidneys are mildly atrophic but otherwise normal except for a few punctate renal vascular calcifications. Ureters and urinary bladder are unremarkable. Stomach/Bowel: There are normal appearances of the stomach, small bowel and colon. The appendix is normal. Vascular/Lymphatic: The abdominal aorta is normal in caliber. There is no atherosclerotic calcification. There is no adenopathy in the abdomen or pelvis. Reproductive: Unremarkable uterus and ovaries. Other: Small volume ascites. No acute inflammatory changes are evident in the abdomen or pelvis. Musculoskeletal: No significant abnormality. Small fat containing umbilical hernia. Review of the MIP images confirms the above findings. IMPRESSION: 1. Negative for acute pulmonary embolus. 2. Left lung consolidation. Cannot exclude pneumonia. However there also is a interstitial fluid, and this could represent fluid overload with asymmetric alveolar edema. 3. Small pleural effusions. 4. No acute findings are evident  in the abdomen or pelvis. Electronically Signed   By: Andreas Newport M.D.   On: 07/12/2015 03:44   Dg Abd Acute W/chest  06/17/2015  CLINICAL DATA:  Acute onset of nausea, vomiting and diarrhea. Mid abdominal pain. Initial encounter. EXAM: DG ABDOMEN ACUTE W/ 1V CHEST COMPARISON:  CTA of the chest performed 02/24/2015, and CT of the abdomen and pelvis from 12/24/2011 FINDINGS: The lungs are well-aerated and clear. There is no evidence of focal opacification, pleural effusion or pneumothorax. The cardiomediastinal silhouette is within normal limits. The visualized bowel gas pattern is unremarkable. Scattered stool and air are seen within the colon; there is no evidence of small bowel dilatation to suggest obstruction. No free intra-abdominal air is identified on the provided upright view. Clips are noted within the right upper quadrant,  reflecting prior cholecystectomy. No acute osseous abnormalities are seen; the sacroiliac joints are unremarkable in appearance. IMPRESSION: 1. Unremarkable bowel gas pattern; no free intra-abdominal air seen. Small amount of stool noted in the colon. 2. No acute cardiopulmonary process seen. Electronically Signed   By: Garald Balding M.D.   On: 06/17/2015 02:01    Microbiology: Recent Results (from the past 240 hour(s))  Blood culture (routine x 2)     Status: None (Preliminary result)   Collection Time: 07/12/15  2:40 AM  Result Value Ref Range Status   Specimen Description BLOOD RIGHT HAND  Final   Special Requests BOTTLES DRAWN AEROBIC ONLY 5CC  Final   Culture   Final    NO GROWTH 2 DAYS Performed at [facility]    Report Status PENDING  Incomplete  Blood culture (routine x 2)     Status: None (Preliminary result)   Collection Time: 07/12/15  2:46 AM  Result Value Ref Range Status   Specimen Description BLOOD RIGHT FOREARM  Final   Special Requests BOTTLES DRAWN AEROBIC ONLY 5CC  Final   Culture   Final    NO GROWTH 2 DAYS Performed at [facility]    Report Status PENDING  Incomplete     Labs: Basic Metabolic Panel:  Recent Labs Lab 07/12/15 0020 07/14/15 0846  NA 140 138  K 4.1 3.9  CL 109 101  CO2 16* 22  GLUCOSE 131* 124*  BUN 61* 26*  CREATININE 11.89* 8.25*  CALCIUM 8.7* 8.8*  PHOS  --  4.6   Liver Function Tests:  Recent Labs Lab 07/12/15 0020 07/14/15 0846  AST 12*  --   ALT 12*  --   ALKPHOS 62  --   BILITOT 0.2*  --   PROT 6.7  --   ALBUMIN 3.3* 2.9*    Recent Labs Lab 07/12/15 0020  LIPASE 45   No results for input(s): AMMONIA in the last 168 hours. CBC:  Recent Labs Lab 07/12/15 0010 07/14/15 0846  WBC 7.5 6.0  NEUTROABS 5.1  --   HGB 10.6* 10.8*  HCT 32.4* 33.7*  MCV 83.9 85.5  PLT 246 253   Cardiac Enzymes:  Recent Labs Lab 07/12/15 0357  TROPONINI <0.03   BNP: BNP (last 3 results) No results for input(s): BNP in the last 8760 hours.  ProBNP (last 3 results) No results for input(s): PROBNP in the last 8760 hours.  CBG:  Recent Labs Lab 07/13/15 1647 07/13/15 2111 07/14/15 0736 07/14/15 1330 07/14/15 1625  GLUCAP 91 179* 121* 207* 167*       Signed:  [facility] 07/14/2015, 5:35 PM

## 2015-07-14 NOTE — Progress Notes (Signed)
Patient discharge paperwork gone over in detail with patient. All questions answered to patient satisfaction. Telemetry discontinued, IV's removed intact. Patient discharged to home with family, by way of wheelchair.

## 2015-07-17 LAB — CULTURE, BLOOD (ROUTINE X 2)
CULTURE: NO GROWTH
Culture: NO GROWTH

## 2015-08-06 ENCOUNTER — Encounter (HOSPITAL_COMMUNITY): Payer: Self-pay | Admitting: Emergency Medicine

## 2015-08-06 DIAGNOSIS — N186 End stage renal disease: Secondary | ICD-10-CM | POA: Diagnosis not present

## 2015-08-06 DIAGNOSIS — Z9889 Other specified postprocedural states: Secondary | ICD-10-CM | POA: Diagnosis not present

## 2015-08-06 DIAGNOSIS — E8770 Fluid overload, unspecified: Secondary | ICD-10-CM | POA: Insufficient documentation

## 2015-08-06 DIAGNOSIS — Z7984 Long term (current) use of oral hypoglycemic drugs: Secondary | ICD-10-CM | POA: Diagnosis not present

## 2015-08-06 DIAGNOSIS — K219 Gastro-esophageal reflux disease without esophagitis: Secondary | ICD-10-CM | POA: Diagnosis not present

## 2015-08-06 DIAGNOSIS — I25119 Atherosclerotic heart disease of native coronary artery with unspecified angina pectoris: Secondary | ICD-10-CM | POA: Insufficient documentation

## 2015-08-06 DIAGNOSIS — G629 Polyneuropathy, unspecified: Secondary | ICD-10-CM | POA: Insufficient documentation

## 2015-08-06 DIAGNOSIS — G8929 Other chronic pain: Secondary | ICD-10-CM | POA: Insufficient documentation

## 2015-08-06 DIAGNOSIS — E785 Hyperlipidemia, unspecified: Secondary | ICD-10-CM | POA: Insufficient documentation

## 2015-08-06 DIAGNOSIS — E119 Type 2 diabetes mellitus without complications: Secondary | ICD-10-CM | POA: Insufficient documentation

## 2015-08-06 DIAGNOSIS — Z862 Personal history of diseases of the blood and blood-forming organs and certain disorders involving the immune mechanism: Secondary | ICD-10-CM | POA: Diagnosis not present

## 2015-08-06 DIAGNOSIS — Z794 Long term (current) use of insulin: Secondary | ICD-10-CM | POA: Diagnosis not present

## 2015-08-06 DIAGNOSIS — Z7982 Long term (current) use of aspirin: Secondary | ICD-10-CM | POA: Insufficient documentation

## 2015-08-06 DIAGNOSIS — Z86018 Personal history of other benign neoplasm: Secondary | ICD-10-CM | POA: Insufficient documentation

## 2015-08-06 DIAGNOSIS — F329 Major depressive disorder, single episode, unspecified: Secondary | ICD-10-CM | POA: Diagnosis not present

## 2015-08-06 DIAGNOSIS — I120 Hypertensive chronic kidney disease with stage 5 chronic kidney disease or end stage renal disease: Secondary | ICD-10-CM | POA: Insufficient documentation

## 2015-08-06 DIAGNOSIS — R51 Headache: Secondary | ICD-10-CM | POA: Diagnosis present

## 2015-08-06 DIAGNOSIS — Z7951 Long term (current) use of inhaled steroids: Secondary | ICD-10-CM | POA: Insufficient documentation

## 2015-08-06 DIAGNOSIS — Z79899 Other long term (current) drug therapy: Secondary | ICD-10-CM | POA: Diagnosis not present

## 2015-08-06 DIAGNOSIS — Z8701 Personal history of pneumonia (recurrent): Secondary | ICD-10-CM | POA: Insufficient documentation

## 2015-08-06 DIAGNOSIS — Z992 Dependence on renal dialysis: Secondary | ICD-10-CM | POA: Diagnosis not present

## 2015-08-06 DIAGNOSIS — J45909 Unspecified asthma, uncomplicated: Secondary | ICD-10-CM | POA: Diagnosis not present

## 2015-08-06 LAB — COMPREHENSIVE METABOLIC PANEL
ALT: 16 U/L (ref 14–54)
ANION GAP: 16 — AB (ref 5–15)
AST: 18 U/L (ref 15–41)
Albumin: 3.6 g/dL (ref 3.5–5.0)
Alkaline Phosphatase: 56 U/L (ref 38–126)
BUN: 89 mg/dL — ABNORMAL HIGH (ref 6–20)
CHLORIDE: 109 mmol/L (ref 101–111)
CO2: 15 mmol/L — ABNORMAL LOW (ref 22–32)
Calcium: 8.3 mg/dL — ABNORMAL LOW (ref 8.9–10.3)
Creatinine, Ser: 15.28 mg/dL — ABNORMAL HIGH (ref 0.44–1.00)
GFR, EST AFRICAN AMERICAN: 3 mL/min — AB (ref >60)
GFR, EST NON AFRICAN AMERICAN: 3 mL/min — AB (ref >60)
Glucose, Bld: 136 mg/dL — ABNORMAL HIGH (ref 65–99)
POTASSIUM: 4.3 mmol/L (ref 3.5–5.1)
Sodium: 140 mmol/L (ref 135–145)
TOTAL PROTEIN: 7.6 g/dL (ref 6.5–8.1)
Total Bilirubin: 0.4 mg/dL (ref 0.3–1.2)

## 2015-08-06 LAB — URINALYSIS, ROUTINE W REFLEX MICROSCOPIC
Bilirubin Urine: NEGATIVE
GLUCOSE, UA: 250 mg/dL — AB
Ketones, ur: NEGATIVE mg/dL
LEUKOCYTES UA: NEGATIVE
NITRITE: NEGATIVE
PH: 6 (ref 5.0–8.0)
Specific Gravity, Urine: 1.017 (ref 1.005–1.030)

## 2015-08-06 LAB — CBC
HEMATOCRIT: 40.4 % (ref 36.0–46.0)
HEMOGLOBIN: 12.8 g/dL (ref 12.0–15.0)
MCH: 26.6 pg (ref 26.0–34.0)
MCHC: 31.7 g/dL (ref 30.0–36.0)
MCV: 84 fL (ref 78.0–100.0)
Platelets: 243 10*3/uL (ref 150–400)
RBC: 4.81 MIL/uL (ref 3.87–5.11)
RDW: 15.9 % — ABNORMAL HIGH (ref 11.5–15.5)
WBC: 4.7 10*3/uL (ref 4.0–10.5)

## 2015-08-06 LAB — URINE MICROSCOPIC-ADD ON

## 2015-08-06 LAB — POC URINE PREG, ED: Preg Test, Ur: NEGATIVE

## 2015-08-06 NOTE — ED Notes (Addendum)
Pt states she has missed her last 4 dialysis appts because of problems with her car.  States she went to dialysis and they told her she would have to come to ED to be seen because of missed appts.  C/o nausea and headache.  Did not take BP medication today due to nausea.  Last took it yesterday morning.

## 2015-08-07 ENCOUNTER — Emergency Department (HOSPITAL_COMMUNITY)
Admission: EM | Admit: 2015-08-07 | Discharge: 2015-08-07 | Disposition: A | Discharge status: Home / Self Care | Payer: Medicare Other | Attending: Emergency Medicine | Admitting: Emergency Medicine

## 2015-08-07 DIAGNOSIS — R519 Headache, unspecified: Secondary | ICD-10-CM

## 2015-08-07 DIAGNOSIS — Z992 Dependence on renal dialysis: Secondary | ICD-10-CM

## 2015-08-07 DIAGNOSIS — E8770 Fluid overload, unspecified: Secondary | ICD-10-CM

## 2015-08-07 DIAGNOSIS — R51 Headache: Secondary | ICD-10-CM | POA: Diagnosis not present

## 2015-08-07 DIAGNOSIS — N186 End stage renal disease: Secondary | ICD-10-CM

## 2015-08-07 MED ORDER — CLONIDINE HCL 0.2 MG PO TABS
0.2000 mg | ORAL_TABLET | Freq: Once | ORAL | Status: AC
  Administered 2015-08-07: 0.2 mg via ORAL
  Filled 2015-08-07: qty 1

## 2015-08-07 MED ORDER — HYDROCODONE-ACETAMINOPHEN 5-325 MG PO TABS
2.0000 | ORAL_TABLET | Freq: Once | ORAL | Status: AC
  Administered 2015-08-07: 2 via ORAL
  Filled 2015-08-07: qty 2

## 2015-08-07 NOTE — ED Provider Notes (Signed)
CSN: [id number]     Arrival date & time 08/06/15  2211 History  By signing my name below, I, Linda Ellison, attest that this documentation has been prepared under the direction and in the presence of Veryl Speak, MD. Electronically Signed: Altamease Ellison, ED Scribe. 08/07/2015. 12:51 AM   Chief Complaint  Patient presents with  . missed dialysis x 4   . Headache  . Nausea   The history is provided by the patient. No language interpreter was used.   Linda Ellison is a 40 y.o. female with PMHx of ESRD on hemodialysis, CHF, CAD, HTN, DM, HLD  who presents to the Emergency Department for evaluation after having missed at least 4 dialysis treatments. Pt states that she has missed dialysis for the last 2 weeks, since 07/24/15. She was told to come to the ED for lab work, specifically a potassium level,  before her dialysis center would treat her. She missed dialysis because of transportation issues. Associated symptoms include diffuse itching, headache, mild SOB, nausea, and LE swelling. Pt denies chest pain and SOB.   Past Medical History  Diagnosis Date  . Asthma   . ESRD on hemodialysis ([place])     started dialysis 01/29/11  . Weight loss   . Loss of appetite   . CHF (congestive heart failure) ([place])   . Hypertension     a. Also with hx of hypotension - has been started on midodrine.  . Hyperlipidemia   . Diabetes mellitus   . Peripheral neuropathy ([place])   . Iron deficiency anemia   . Hyperparathyroidism, secondary ([place])   . Sciatica   . GERD (gastroesophageal reflux disease)   . Adenoma   . Gastroparesis   . Esophageal erosions     a. Per EGD 10/2011.  Marland Kitchen CAD (coronary artery disease)     a. False positive stress echo 06/2012 at [facility] - cath with mild nonobstructive CAD at [facility] (mild luminal irregularities in LAD, moderate diffuse disease in distal RCA).  . History of echocardiogram     Echo 9/14: EF 55-60%, normal wall motion, normal diastolic function, PASP 21  . Chronic chest  pain     a. H/o longstanding atypical CP. Nonobstructive cath 06/2012 and negative VQ.  Marland Kitchen ASCVD (arteriosclerotic cardiovascular disease)   . Anginal pain ([place])   . Peripheral vascular disease ([place])   . PONV (postoperative nausea and vomiting)   . Shortness of breath 07/12/2015  . Depression   . HCAP (healthcare-associated pneumonia) 07/12/2015   Past Surgical History  Procedure Laterality Date  . Cesarean section    . Cholecystectomy  1996  . Av fistula placement  08/2010    Left radiocephalic AVF  . Ligation goretex fistula  01/04/11    Left AVF  . Colonoscopy    . Esophagogastroduodenoscopy  10/19/2011    Dr. Silvano Rusk  . Foot surgery Left   . Tubal ligation    . Left heart catheterization with coronary angiogram N/A 06/27/2012    Procedure: LEFT HEART CATHETERIZATION WITH CORONARY ANGIOGRAM;  Surgeon: Larey Dresser, MD;  Location: [facility] CATH LAB;  Service: Cardiovascular;  Laterality: N/A;  . Fracture surgery Left     left femur  . Dilation and curettage of uterus    . Av fistula placement Right 05/07/2014    Procedure: ARTERIOVENOUS (AV) FISTULA CREATION;  Surgeon: Elam Dutch, MD;  Location: [place];  Service: Vascular;  Laterality: Right;  . Av fistula placement Right 07/02/2014  Procedure: INSERTION OF ARTERIOVENOUS (AV) GORE-TEX GRAFT ARM;  Surgeon: Elam Dutch, MD;  Location[facility] OR;  Service: Vascular;  Laterality: Right;  . Thrombectomy and revision of arterioventous (av) goretex  graft Right 07/16/2014    Procedure: THROMBECTOMY  Right  arm  ARTERIOVENOUS  GORETEX  GRAFT;  Surgeon: Elam Dutch, MD;  Location: [place];  Service: Vascular;  Laterality: Right;  . Peripheral vascular catheterization Left 08/08/2014    Procedure: Upper Extremity Angiography;  Surgeon: Algernon Huxley, MD;  Location: [place] CV LAB;  Service: Cardiovascular;  Laterality: Left;  . Peripheral vascular catheterization Left 08/08/2014    Procedure: Upper Extremity Intervention;  Surgeon: Algernon Huxley, MD;  Location: [place] CV LAB;  Service: Cardiovascular;  Laterality: Left;  . Ligation of arteriovenous  fistula Left 08/21/2014    Procedure: LIGATION OF ARTERIOVENOUS  FISTULA;  Surgeon: Algernon Huxley, MD;  Location: [facility] ORS;  Service: Vascular;  Laterality: Left;   Family History  Problem Relation Age of Onset  . Heart disease Mother     heart attack at 65 y/o-infected valve  . Kidney disease Mother     was a dialysis patient  . Heart disease Brother   . Kidney disease Maternal Grandmother     pre-dialysis  . Kidney failure Paternal Uncle   . Kidney failure Paternal Aunt   . Diabetes Father   . Hypertension Father   . Colon cancer Neg Hx   . Arthritis Brother   . Diabetes Maternal Aunt   . Hypertension Maternal Aunt   . Diabetes Paternal Aunt   . Heart disease Paternal Aunt   . Hypertension Paternal Aunt    Social History  Substance Use Topics  . Smoking status: Never Smoker   . Smokeless tobacco: Never Used  . Alcohol Use: No   OB History    No data available     Review of Systems  10 Systems reviewed and all are negative for acute change except as noted in the HPI.  Allergies  Ciprofloxacin; Fluocinolone; Pineapple; Strawberry extract; Adhesive; Chlorhexidine gluconate; Clindamycin; Oxycodone; and Shrimp  Home Medications   Prior to Admission medications   Medication Sig Start Date End Date Taking? Authorizing Provider  albuterol (PROVENTIL HFA;VENTOLIN HFA) 108 (90 BASE) MCG/ACT inhaler Inhale 2 puffs into the lungs every 6 (six) hours as needed for wheezing or shortness of breath.    Historical Provider, MD  amLODipine (NORVASC) 5 MG tablet Take 1 tablet (5 mg total) by mouth at bedtime. 07/14/15   Geradine Girt, DO  aspirin EC 81 MG tablet Take 81 mg by mouth daily.    Historical Provider, MD  Calcium Carbonate-Vitamin D (CALCIUM 600+D) 600-400 MG-UNIT tablet Take 1 tablet by mouth daily.    Historical Provider, MD  citalopram (CELEXA) 10 MG  tablet Take 10 mg by mouth at bedtime.     Historical Provider, MD  fluticasone (FLOVENT HFA) 110 MCG/ACT inhaler Inhale 2 puffs into the lungs 2 (two) times daily. 03/26/15   Rolland Porter, MD  gabapentin (NEURONTIN) 100 MG capsule Take 100-200 mg by mouth 2 (two) times daily. Pt takes one capsule in the morning and two at bedtime.    Historical Provider, MD  HYDROcodone-acetaminophen (NORCO) 5-325 MG tablet Take 1 tablet by mouth every 6 (six) hours as needed for moderate pain. 07/14/15   Geradine Girt, DO  hydrOXYzine (ATARAX/VISTARIL) 25 MG tablet Take 25 mg by mouth every 4 (four) hours as needed  for itching.     Historical Provider, MD  Insulin Detemir (LEVEMIR FLEXTOUCH) 100 UNIT/ML Pen Inject 22 Units into the skin every 12 (twelve) hours.     Historical Provider, MD  labetalol (NORMODYNE) 200 MG tablet Take 1 tablet (200 mg total) by mouth every Monday, Wednesday, and Friday at 8 PM. 07/14/15   Geradine Girt, DO  linagliptin (TRADJENTA) 5 MG TABS tablet Take 5 mg by mouth at bedtime.    Historical Provider, MD  methimazole (TAPAZOLE) 5 MG tablet Take 5 mg by mouth at bedtime.     Historical Provider, MD  multivitamin (RENA-VIT) TABS tablet Take 1 tablet by mouth daily.    Historical Provider, MD  omeprazole (PRILOSEC) 20 MG capsule Take 20 mg by mouth daily.    Historical Provider, MD  ondansetron (ZOFRAN ODT) 4 MG disintegrating tablet Take 1 tablet (4 mg total) by mouth every 8 (eight) hours as needed for nausea or vomiting. 06/17/15   Merryl Hacker, MD  sevelamer carbonate (RENVELA) 800 MG tablet Take 800-1,600 mg by mouth 5 (five) times daily. Pt takes two tablets three times daily with meals and one tablet two times daily with snacks.    Historical Provider, MD  simvastatin (ZOCOR) 5 MG tablet Take 5 mg by mouth at bedtime.    Historical Provider, MD  topiramate (TOPAMAX) 50 MG tablet Take 50 mg by mouth at bedtime.     Historical Provider, MD   BP 190/134 mmHg  Pulse 98  Temp(Src)  98.3 F (36.8 C) (Oral)  Resp 15  Ht 5\' 6"  (1.676 m)  Wt 206 lb (93.441 kg)  BMI 33.27 kg/m2  SpO2 100%  LMP 07/12/2015 Physical Exam  Constitutional: She is oriented to person, place, and time. She appears well-developed and well-nourished. No distress.  HENT:  Head: Normocephalic and atraumatic.  Mouth/Throat: Oropharynx is clear and moist.  Eyes: EOM are normal. Pupils are equal, round, and reactive to light.  Neck: Normal range of motion.  Cardiovascular: Normal rate, regular rhythm and normal heart sounds.   Pulmonary/Chest: Effort normal. She has rales.  Slight rales in the bases   Abdominal: Soft. She exhibits no distension. There is no tenderness.  Musculoskeletal: Normal range of motion. She exhibits edema.  There is 2+ pitting edema of BLEs  Neurological: She is alert and oriented to person, place, and time.  Skin: Skin is warm and dry.  Psychiatric: She has a normal mood and affect. Judgment normal.  Nursing note and vitals reviewed.   ED Course  Procedures (including critical care time) DIAGNOSTIC STUDIES: Oxygen Saturation is 100% on RA,  normal by my interpretation.    COORDINATION OF CARE: 12:36 AM Discussed treatment plan which includes lab work with pt at bedside and pt agreed to plan.  12:48 AM-Consult complete with Dr. Joelyn Oms (Nephrology). Patient case explained and discussed.  Call ended at 12:50 AM  Labs Review Labs Reviewed  COMPREHENSIVE METABOLIC PANEL - Abnormal; Notable for the following:    CO2 15 (*)    Glucose, Bld 136 (*)    BUN 89 (*)    Creatinine, Ser 15.28 (*)    Calcium 8.3 (*)    GFR calc non Af Amer 3 (*)    GFR calc Af Amer 3 (*)    Anion gap 16 (*)    All other components within normal limits  CBC - Abnormal; Notable for the following:    RDW 15.9 (*)    All other components within  normal limits  URINALYSIS, ROUTINE W REFLEX MICROSCOPIC (NOT AT [facility]) - Abnormal; Notable for the following:    APPearance CLOUDY (*)     Glucose, UA 250 (*)    Hgb urine dipstick SMALL (*)    Protein, ur >300 (*)    All other components within normal limits  URINE MICROSCOPIC-ADD ON - Abnormal; Notable for the following:    Squamous Epithelial / LPF 0-5 (*)    Bacteria, UA RARE (*)    Casts HYALINE CASTS (*)    All other components within normal limits  POC URINE PREG, ED    Imaging Review No results found. I have personally reviewed and evaluated these lab results as part of my medical decision-making.   EKG Interpretation None      MDM   Final diagnoses:  None   Patient presents with complaints of headache and having missed dialysis for the last 4 sessions. Her last dialysis date was March 20. She reports missing this due to transportation reasons. She attempted to be dialyzed today, however was told to come here to be evaluated and have laboratory studies obtained. She is somewhat acidotic, however is not showing hyperkalemia and her lab work. Her blood pressure is significantly elevated and she will be given a dose of clonidine as well as hydrocodone for her headache. I have spoken with Dr. Joelyn Oms from nephrology who is in agreement with the treatment plan. He wants her to call her dialysis center tomorrow at 6 AM to arrange a dialysis session for tomorrow.  I personally performed the services described in this documentation, which was scribed in my presence. The recorded information has been reviewed and is accurate.       Veryl Speak, MD 08/07/15 [phone]

## 2015-08-07 NOTE — Discharge Instructions (Signed)
Call your dialysis center tomorrow morning at 6 AM.  Your laboratory studies are suitable for you to undergo dialysis tomorrow. Dr. Joelyn Oms is aware of your laboratory studies and treatment plan.   Dialysis Dialysis is a procedure that replaces some of the work healthy kidneys do. It is done when you lose about 85-90% of your kidney function. It may also be done earlier if your symptoms may be improved by dialysis. During dialysis, wastes, salt, and extra water are removed from the blood, and the levels of certain chemicals in the blood (such as potassium) are maintained. Dialysis is done in sessions. Dialysis sessions are continued until the kidneys get better. If the kidneys cannot get better, such as in end-stage kidney disease, dialysis is continued for life or until you receive a new kidney (kidney transplant). There are two types of dialysis: hemodialysis and peritoneal dialysis. WHAT IS HEMODIALYSIS?  Hemodialysis is a type of dialysis in which a machine called a dialyzer is used to filter the blood. Before beginning hemodialysis, you will have surgery to create a site where blood can be removed from the body and returned to the body (vascular access). There are three types of vascular accesses:  Arteriovenous fistula. To create this type of access, an artery is connected to a vein (usually in the arm). A fistula takes 1-6 months to develop after surgery. If it develops properly, it usually lasts longer than the other types of vascular accesses. It is also less likely to become infected and cause blood clots.  Arteriovenous graft. To create this type of access, an artery and a vein in the arm are connected with a tube. A graft may be used within 2-3 weeks of surgery.  A venous catheter. To create this type of access, a thin, flexible tube (catheter) is placed in a large vein in your neck, chest, or groin. A catheter may be used right away. It is usually used as a temporary access when dialysis  needs to begin immediately. During hemodialysis, blood leaves the body through your access. It travels through a tube to the dialyzer, where it is filtered. The blood then returns to your body through another tube. Hemodialysis is usually performed by a health care provider at a hospital or dialysis center three times a week. Visits last about 3-4 hours. It may also be performed with the help of another person at home with training.  WHAT IS PERITONEAL DIALYSIS? Peritoneal dialysis is a type of dialysis in which the thin lining of the abdomen (peritoneum) is used as a filter. Before beginning peritoneal dialysis, you will have surgery to place a catheter in your abdomen. The catheter will be used to transfer a fluid called dialysate to and from your abdomen. At the start of a session, your abdomen is filled with dialysate. During the session, wastes, salt, and extra water in the blood pass through the peritoneum and into the dialysate. The dialysate is drained from the body at the end of the session. The process of filling and draining the dialysate is called an exchange. Exchanges are repeated until you have used up all the dialysate for the day. Peritoneal dialysis may be performed by you at home or at almost any other location. It is done every day. You may need up to five exchanges a day. The amount of time the dialysate is in your body between exchanges is called a dwell. The dwell depends on the number of exchanges needed and the characteristics of the peritoneum.  It usually varies from 1.5-3 hours. You may go about your day normally between exchanges. Alternately, the exchanges may be done at night while you sleep, using a machine called a cycler. WHICH TYPE OF DIALYSIS SHOULD I CHOOSE?  Both hemodialysis and peritoneal dialysis have advantages and disadvantages. Talk to your health care provider about which type of dialysis would be best for you. Your lifestyle and preferences should be considered  along with your medical condition. In some cases, only one type of dialysis may be an option.  Advantages of hemodialysis  It is done less often than peritoneal dialysis.  Someone else can do the dialysis for you.  If you go to a dialysis center, your health care provider will be able to recognize any problems right away.  If you go to a dialysis center, you can interact with others who are having dialysis. This can provide you with emotional support. Disadvantages of hemodialysis  Hemodialysis may cause cramps and low blood pressure. It may leave you feeling tired on the days you have the treatment.  If you go to a dialysis center, you will need to make weekly appointments and work around the center's schedule.  You will need to take extra care when traveling. If you go to a dialysis center, you will need to make special arrangements to visit a dialysis center near your destination. If you are having treatments at home, you will need to take the dialyzer with you to your destination.  You will need to avoid more foods than you would need to avoid on peritoneal dialysis. Advantages of peritoneal dialysis  It is less likely than hemodialysis to cause cramps and low blood pressure.  You may do exchanges on your own wherever you are, including when you travel.  You do not need to avoid as many foods as you do on hemodialysis. Disadvantages of peritoneal dialysis  It is done more often than hemodialysis.  Performing peritoneal dialysis requires you to have dexterity of the hands. You must also be able to lift bags.  You will have to learn sterilization techniques. You will need to practice them every day to reduce the risk of infection. WHAT CHANGES WILL I NEED TO MAKE TO MY DIET DURING DIALYSIS? Both hemodialysis and peritoneal dialysis require you to make some changes to your diet. For example, you will need to limit your intake of foods high in the minerals phosphorus and potassium.  You will also need to limit your fluid intake. Your dietitian can help you plan meals. A good meal plan can improve your dialysis and your health.  WHAT SHOULD I EXPECT WHEN BEGINNING DIALYSIS? Adjusting to the dialysis treatment, schedule, and diet can take some time. You may need to stop working and may not be able to do some of the things you normally do. You may feel anxious or depressed when beginning dialysis. Eventually, many people feel better overall because of dialysis. Some people are able to return to work after making some changes, such as reducing work intensity. WHERE CAN I FIND MORE INFORMATION?   [place]: www.kidney.org  American Association of Kidney Patients: BombTimer.gl  American [employer]: www.kidneyfund.org   This information is not intended to replace advice given to you by your health care provider. Make sure you discuss any questions you have with your health care provider.   Document Released: 06/12/2002 Document Revised: 04/12/2014 Document Reviewed: 05/16/2012 Elsevier Interactive Patient Education [employer].

## 2015-08-07 NOTE — ED Notes (Signed)
Dr. Stark Jock notified of pt's B/P increase. States it's "ok to discharge pt...it's not going to go down until she has dialysis". Pt and family notified. Discharge papers given.

## 2015-08-08 ENCOUNTER — Emergency Department (HOSPITAL_COMMUNITY)
Admission: EM | Admit: 2015-08-08 | Discharge: 2015-08-08 | Disposition: A | Discharge status: Home / Self Care | Payer: Medicare Other | Attending: Emergency Medicine | Admitting: Emergency Medicine

## 2015-08-08 ENCOUNTER — Encounter (HOSPITAL_COMMUNITY): Payer: Self-pay

## 2015-08-08 DIAGNOSIS — Z7951 Long term (current) use of inhaled steroids: Secondary | ICD-10-CM | POA: Insufficient documentation

## 2015-08-08 DIAGNOSIS — Z7984 Long term (current) use of oral hypoglycemic drugs: Secondary | ICD-10-CM | POA: Insufficient documentation

## 2015-08-08 DIAGNOSIS — N186 End stage renal disease: Secondary | ICD-10-CM | POA: Diagnosis not present

## 2015-08-08 DIAGNOSIS — I2510 Atherosclerotic heart disease of native coronary artery without angina pectoris: Secondary | ICD-10-CM | POA: Insufficient documentation

## 2015-08-08 DIAGNOSIS — J45909 Unspecified asthma, uncomplicated: Secondary | ICD-10-CM | POA: Insufficient documentation

## 2015-08-08 DIAGNOSIS — Z79891 Long term (current) use of opiate analgesic: Secondary | ICD-10-CM | POA: Diagnosis not present

## 2015-08-08 DIAGNOSIS — Z794 Long term (current) use of insulin: Secondary | ICD-10-CM | POA: Diagnosis not present

## 2015-08-08 DIAGNOSIS — E1140 Type 2 diabetes mellitus with diabetic neuropathy, unspecified: Secondary | ICD-10-CM | POA: Diagnosis not present

## 2015-08-08 DIAGNOSIS — Z8589 Personal history of malignant neoplasm of other organs and systems: Secondary | ICD-10-CM | POA: Insufficient documentation

## 2015-08-08 DIAGNOSIS — K219 Gastro-esophageal reflux disease without esophagitis: Secondary | ICD-10-CM | POA: Diagnosis not present

## 2015-08-08 DIAGNOSIS — Z79899 Other long term (current) drug therapy: Secondary | ICD-10-CM | POA: Diagnosis not present

## 2015-08-08 DIAGNOSIS — E785 Hyperlipidemia, unspecified: Secondary | ICD-10-CM | POA: Insufficient documentation

## 2015-08-08 DIAGNOSIS — F329 Major depressive disorder, single episode, unspecified: Secondary | ICD-10-CM | POA: Insufficient documentation

## 2015-08-08 DIAGNOSIS — I509 Heart failure, unspecified: Secondary | ICD-10-CM | POA: Diagnosis not present

## 2015-08-08 DIAGNOSIS — R112 Nausea with vomiting, unspecified: Secondary | ICD-10-CM | POA: Diagnosis present

## 2015-08-08 DIAGNOSIS — Z7982 Long term (current) use of aspirin: Secondary | ICD-10-CM | POA: Diagnosis not present

## 2015-08-08 DIAGNOSIS — I132 Hypertensive heart and chronic kidney disease with heart failure and with stage 5 chronic kidney disease, or end stage renal disease: Secondary | ICD-10-CM | POA: Insufficient documentation

## 2015-08-08 MED ORDER — ONDANSETRON 4 MG PO TBDP
4.0000 mg | ORAL_TABLET | Freq: Once | ORAL | Status: AC
  Administered 2015-08-08: 4 mg via ORAL
  Filled 2015-08-08: qty 1

## 2015-08-08 MED ORDER — ONDANSETRON HCL 4 MG PO TABS: 4.0000 mg | ORAL_TABLET | Freq: Four times a day (QID) | ORAL | Status: DC

## 2015-08-08 MED ORDER — HYDROCODONE-ACETAMINOPHEN 5-325 MG PO TABS
1.0000 | ORAL_TABLET | Freq: Once | ORAL | Status: AC
  Administered 2015-08-08: 1 via ORAL
  Filled 2015-08-08: qty 1

## 2015-08-08 NOTE — Progress Notes (Signed)
Entered in d/c instructions medicaid [place] access patient Call on 08/08/2015 DSS [address] [place] USE resources provided by ED Social worker to assist wth getting transportation to your dialysis appointments You have access for transportation via Medicaid to all medical appointments Please call [phone] or [phone] to get assist with setting up transportatiion services    [facility] Go on 08/09/2015 You are scheduled to be seen at [place] am on 08/09/15 You have now been placed on a Tuesday-Thursday- Saturday dialysis schedule  [address]  [place], [place] [address] [phone]

## 2015-08-08 NOTE — Progress Notes (Addendum)
CSW spoke with EDP and Nurse. CSW spoke with patient's fiancee, [name] at bedside, as patient was asleep. He stated he was waiting on a check for patient's car to be repaired. He stated patient had been going to her dialysis appointments by car previously. He stated patient goes to [employer] and patient has utilized [employer] in the past.   [place] staffed with ED CM. ED CM provided information for various transportation services to give to patient, as well as the SCAT application to give to patient. ED CM provided the contact number for SW at [facility] [phone] (social worker, Tess) to possibly coordinate transportation for patient's appointment in the morning. CSW spoke with Tess, SW and she stated she would need to know the address for patient and where she would be in the morning. She stated she had a contact person she could speak with at [facility] to see if patient could be brought in the AM.  CSW spoke with patient, as she was in the process of being discharged to see where she could be located in the AM. Patient's fiancee stated she would take patient to her appointment in the AM. Nurse was there to witness this information. Patient stated she plans to go to the facility on today to possibly have two to three hours of dialysis. CSW explained to patient the information for various transportation that was explained to the fiancee and informed her of the application. She stated she had the information. No further questions noted for CSW at this time.    CSW spoke with Tess, SW to inform her that patient's fiancee was going to bring her to her appointment in the AM. CSW informed the EDP.   [name] [id number] ED CSW 08/08/2015 3:57 PM

## 2015-08-08 NOTE — ED Notes (Signed)
MD Kohut aware of labs pending. Does not want PT to have labs draw at this time.

## 2015-08-08 NOTE — ED Notes (Signed)
MD at bedside. 

## 2015-08-08 NOTE — ED Notes (Signed)
unsuccessful with blood draw

## 2015-08-08 NOTE — ED Notes (Addendum)
Attempt made with [place] for blood draw.Did not stick only assessed.  Not successful. EDP Koh[place] made aware.No orders given EDP in to see this pt. EDP did state he wanted to see if [facility] would accept pt for treatment.

## 2015-08-08 NOTE — Discharge Instructions (Signed)
Dialysis °Dialysis is a procedure that replaces some of the work healthy kidneys do. It is done when you lose about 85-90% of your kidney function. It may also be done earlier if your symptoms may be improved by dialysis. During dialysis, wastes, salt, and extra water are removed from the blood, and the levels of certain chemicals in the blood (such as potassium) are maintained. Dialysis is done in sessions. Dialysis sessions are continued until the kidneys get better. If the kidneys cannot get better, such as in end-stage kidney disease, dialysis is continued for life or until you receive a new kidney (kidney transplant). There are two types of dialysis: hemodialysis and peritoneal dialysis. °WHAT IS HEMODIALYSIS?  °Hemodialysis is a type of dialysis in which a machine called a dialyzer is used to filter the blood. Before beginning hemodialysis, you will have surgery to create a site where blood can be removed from the body and returned to the body (vascular access). There are three types of vascular accesses: °· Arteriovenous fistula. To create this type of access, an artery is connected to a vein (usually in the arm). A fistula takes 1-6 months to develop after surgery. If it develops properly, it usually lasts longer than the other types of vascular accesses. It is also less likely to become infected and cause blood clots. °· Arteriovenous graft. To create this type of access, an artery and a vein in the arm are connected with a tube. A graft may be used within 2-3 weeks of surgery. °· A venous catheter. To create this type of access, a thin, flexible tube (catheter) is placed in a large vein in your neck, chest, or groin. A catheter may be used right away. It is usually used as a temporary access when dialysis needs to begin immediately. °During hemodialysis, blood leaves the body through your access. It travels through a tube to the dialyzer, where it is filtered. The blood then returns to your body through  another tube. °Hemodialysis is usually performed by a health care provider at a hospital or dialysis center three times a week. Visits last about 3-4 hours. It may also be performed with the help of another person at home with training.  °WHAT IS PERITONEAL DIALYSIS? °Peritoneal dialysis is a type of dialysis in which the thin lining of the abdomen (peritoneum) is used as a filter. Before beginning peritoneal dialysis, you will have surgery to place a catheter in your abdomen. The catheter will be used to transfer a fluid called dialysate to and from your abdomen. At the start of a session, your abdomen is filled with dialysate. During the session, wastes, salt, and extra water in the blood pass through the peritoneum and into the dialysate. The dialysate is drained from the body at the end of the session. The process of filling and draining the dialysate is called an exchange. Exchanges are repeated until you have used up all the dialysate for the day. °Peritoneal dialysis may be performed by you at home or at almost any other location. It is done every day. You may need up to five exchanges a day. The amount of time the dialysate is in your body between exchanges is called a dwell. The dwell depends on the number of exchanges needed and the characteristics of the peritoneum. It usually varies from 1.5-3 hours. You may go about your day normally between exchanges. Alternately, the exchanges may be done at night while you sleep, using a machine called a cycler. °WHICH TYPE   OF DIALYSIS SHOULD I CHOOSE?  °Both hemodialysis and peritoneal dialysis have advantages and disadvantages. Talk to your health care provider about which type of dialysis would be best for you. Your lifestyle and preferences should be considered along with your medical condition. In some cases, only one type of dialysis may be an option.  °Advantages of hemodialysis °· It is done less often than peritoneal dialysis. °· Someone else can do the  dialysis for you. °· If you go to a dialysis center, your health care provider will be able to recognize any problems right away. °· If you go to a dialysis center, you can interact with others who are having dialysis. This can provide you with emotional support. °Disadvantages of hemodialysis °· Hemodialysis may cause cramps and low blood pressure. It may leave you feeling tired on the days you have the treatment. °· If you go to a dialysis center, you will need to make weekly appointments and work around the center's schedule. °· You will need to take extra care when traveling. If you go to a dialysis center, you will need to make special arrangements to visit a dialysis center near your destination. If you are having treatments at home, you will need to take the dialyzer with you to your destination. °· You will need to avoid more foods than you would need to avoid on peritoneal dialysis. °Advantages of peritoneal dialysis °· It is less likely than hemodialysis to cause cramps and low blood pressure. °· You may do exchanges on your own wherever you are, including when you travel. °· You do not need to avoid as many foods as you do on hemodialysis. °Disadvantages of peritoneal dialysis °· It is done more often than hemodialysis. °· Performing peritoneal dialysis requires you to have dexterity of the hands. You must also be able to lift bags. °· You will have to learn sterilization techniques. You will need to practice them every day to reduce the risk of infection.  °WHAT CHANGES WILL I NEED TO MAKE TO MY DIET DURING DIALYSIS? °Both hemodialysis and peritoneal dialysis require you to make some changes to your diet. For example, you will need to limit your intake of foods high in the minerals phosphorus and potassium. You will also need to limit your fluid intake. Your dietitian can help you plan meals. A good meal plan can improve your dialysis and your health.  °WHAT SHOULD I EXPECT WHEN BEGINNING  DIALYSIS? °Adjusting to the dialysis treatment, schedule, and diet can take some time. You may need to stop working and may not be able to do some of the things you normally do. You may feel anxious or depressed when beginning dialysis. Eventually, many people feel better overall because of dialysis. Some people are able to return to work after making some changes, such as reducing work intensity. °WHERE CAN I FIND MORE INFORMATION?  °· National Kidney Foundation: www.kidney.org °· American Association of Kidney Patients: www.aakp.org °· American Kidney Fund: www.kidneyfund.org °  °This information is not intended to replace advice given to you by your health care provider. Make sure you discuss any questions you have with your health care provider. °  °Document Released: 06/12/2002 Document Revised: 04/12/2014 Document Reviewed: 05/16/2012 °Elsevier Interactive Patient Education ©2016 Elsevier Inc. ° °

## 2015-08-08 NOTE — ED Notes (Signed)
Pt here for re evaluation of stomach pain and N/V. Seen at [facility] yesterday. Pt states she continues to not improve. Pt has not had dialysis since 07/24/2015 due to transportation on issues.

## 2015-08-08 NOTE — ED Notes (Signed)
Pt aware of appt with kidney center tomorrow at [place]. Pt states going by today with family

## 2015-08-08 NOTE — Progress Notes (Signed)
Cm called and spoke with Leafy Ro at [facility] [address] [place], [place] [address] [phone] who confirms in the last 6 months this pt has been non compliant with treatment related to family and care issues and had to be changed from MWF to [place] reports pt has a car and was working   [place] reports pt is still an active patient with them and on Tuesday 08/05/15 their SW, Tess, called and spoke with pt Pt was sent to [facility] ED for labs to assist with getting her on her new T TH S schedule and was ask to com to the dialysis center today but called and was not able to make it Pt was at [facility] ED on 08/07/15 - [facility] provides dialysis services  [facility] is able to get EPIC test results (active order but not populated at this time in [facility])   Updated [place]  Updated ED SW Inquired if a transportation resource to assist with getting pt to her 0710 08/09/15 appt at [place] Inquired if other social issue pt mentioned that can be address SW states pt dozing in and out but has fiance at bedside whom she spoke with    PT IS NOW on a T TH S dialysis scheduled at [place]

## 2015-08-08 NOTE — Progress Notes (Signed)
ED CM consulted by ED SW about transportation means for [employer] access pt  Cm provided pdf for [employer] transportation resources to include [employer] transportation [phone] or [phone] Pt to be given copy of this 4 page pdf of transportation resources plus the complete 4 part application for [employer] by ED SW  CM also discussed with SW that generally the dialysis centers assist pt with transportation for services also and to refer pt to her dialysis center staff if pt is still active at the dialysis center   Entered in d/c instructions  medicaid [place] access patient Call on 08/08/2015 DSS [address] [place] USE resources provided by ED Social worker to assist wth getting transportation to your dialysis appointments You have access for transportation via Medicaid to all medical appointments Please call [phone] or [phone] to get assist with setting up transportatiion services

## 2015-08-08 NOTE — Progress Notes (Signed)
Pt with chs 9 ED visits and one admission in last 6 months  CM reviewed d/c form April 2017 [facility] admission which indicates pt was encouraged to be compliant with MWF HD at [facility] by d/c MD and Dr Jonnie Finner nephrologist  Pt with hx of non compliance noted

## 2015-08-08 NOTE — ED Notes (Signed)
Pt states did call Kidney center this AM. Pt was unable to get to kidney center and continued to have complaints of stomach pain and N/V. RN informed pt to return to ED for evalaution

## 2015-08-08 NOTE — ED Provider Notes (Signed)
CSN: [id number]     Arrival date & time 08/08/15  [id number] History   First MD Initiated Contact with Patient 08/08/15 [phone]     Chief Complaint  Patient presents with  . Abdominal Pain  . Nausea  . Emesis     (Consider location/radiation/quality/duration/timing/severity/associated sxs/prior Treatment) HPI   40yF with PMHx of ESRD on hemodialysis, CHF, CAD, HTN, DM, HLDpresenting after not having dialysis since 4/20. Seen in [facility] ED two days ago. Acidotic, but potassium was ok. Discharged with plans for outpt dialysis.  I called [facility] on [address] and spoke with nurse. She did previously call to arrange session this morning. She did not show up for her time though so dialysis nurse called her at 0730 this morning and she reported that she was having n/v and couldn't keep anything down. Because of this she was advised that she she probably go to the ED. They do not have time to work her back in today. Nurse reported that she already has time allotted tomorrow which would be part of her routine schedule.  She has been having crampy abdominal pain. Nausea and "green" emesis. No fever or chills. No diarrhea. No sick contacts. No respiratory complaints.    Past Medical History  Diagnosis Date  . Asthma   . ESRD on hemodialysis ([place])     started dialysis 01/29/11  . Weight loss   . Loss of appetite   . CHF (congestive heart failure) ([place])   . Hypertension     a. Also with hx of hypotension - has been started on midodrine.  . Hyperlipidemia   . Diabetes mellitus   . Peripheral neuropathy ([place])   . Iron deficiency anemia   . Hyperparathyroidism, secondary ([place])   . Sciatica   . GERD (gastroesophageal reflux disease)   . Adenoma   . Gastroparesis   . Esophageal erosions     a. Per EGD 10/2011.  Marland Kitchen CAD (coronary artery disease)     a. False positive stress echo 06/2012 at [facility] - cath with mild nonobstructive CAD at [facility] (mild luminal irregularities in LAD,  moderate diffuse disease in distal RCA).  . History of echocardiogram     Echo 9/14: EF 55-60%, normal wall motion, normal diastolic function, PASP 21  . Chronic chest pain     a. H/o longstanding atypical CP. Nonobstructive cath 06/2012 and negative VQ.  Marland Kitchen ASCVD (arteriosclerotic cardiovascular disease)   . Anginal pain ([place])   . Peripheral vascular disease ([place])   . PONV (postoperative nausea and vomiting)   . Shortness of breath 07/12/2015  . Depression   . HCAP (healthcare-associated pneumonia) 07/12/2015   Past Surgical History  Procedure Laterality Date  . Cesarean section    . Cholecystectomy  1996  . Av fistula placement  08/2010    Left radiocephalic AVF  . Ligation goretex fistula  01/04/11    Left AVF  . Colonoscopy    . Esophagogastroduodenoscopy  10/19/2011    Dr. Silvano Rusk  . Foot surgery Left   . Tubal ligation    . Left heart catheterization with coronary angiogram N/A 06/27/2012    Procedure: LEFT HEART CATHETERIZATION WITH CORONARY ANGIOGRAM;  Surgeon: Larey Dresser, MD;  Location: [facility] CATH LAB;  Service: Cardiovascular;  Laterality: N/A;  . Fracture surgery Left     left femur  . Dilation and curettage of uterus    . Av fistula placement Right 05/07/2014    Procedure: ARTERIOVENOUS (AV) FISTULA  CREATION;  Surgeon: Elam Dutch, MD;  Location: [place];  Service: Vascular;  Laterality: Right;  . Av fistula placement Right 07/02/2014    Procedure: INSERTION OF ARTERIOVENOUS (AV) GORE-TEX GRAFT ARM;  Surgeon: Elam Dutch, MD;  Location: [facility] OR;  Service: Vascular;  Laterality: Right;  . Thrombectomy and revision of arterioventous (av) goretex  graft Right 07/16/2014    Procedure: THROMBECTOMY  Right  arm  ARTERIOVENOUS  GORETEX  GRAFT;  Surgeon: Elam Dutch, MD;  Location: [place];  Service: Vascular;  Laterality: Right;  . Peripheral vascular catheterization Left 08/08/2014    Procedure: Upper Extremity Angiography;  Surgeon: Algernon Huxley, MD;  Location: [place] CV LAB;  Service: Cardiovascular;  Laterality: Left;  . Peripheral vascular catheterization Left 08/08/2014    Procedure: Upper Extremity Intervention;  Surgeon: Algernon Huxley, MD;  Location: [place] CV LAB;  Service: Cardiovascular;  Laterality: Left;  . Ligation of arteriovenous  fistula Left 08/21/2014    Procedure: LIGATION OF ARTERIOVENOUS  FISTULA;  Surgeon: Algernon Huxley, MD;  Location: [facility] ORS;  Service: Vascular;  Laterality: Left;   Family History  Problem Relation Age of Onset  . Heart disease Mother     heart attack at 40 y/o-infected valve  . Kidney disease Mother     was a dialysis patient  . Heart disease Brother   . Kidney disease Maternal Grandmother     pre-dialysis  . Kidney failure Paternal Uncle   . Kidney failure Paternal Aunt   . Diabetes Father   . Hypertension Father   . Colon cancer Neg Hx   . Arthritis Brother   . Diabetes Maternal Aunt   . Hypertension Maternal Aunt   . Diabetes Paternal Aunt   . Heart disease Paternal Aunt   . Hypertension Paternal Aunt    Social History  Substance Use Topics  . Smoking status: Never Smoker   . Smokeless tobacco: Never Used  . Alcohol Use: No   OB History    No data available     Review of Systems    Allergies  Ciprofloxacin; Fluocinolone; Pineapple; Strawberry extract; Adhesive; Chlorhexidine gluconate; Clindamycin; Oxycodone; and Shrimp  Home Medications   Prior to Admission medications   Medication Sig Start Date End Date Taking? Authorizing Provider  albuterol (PROVENTIL HFA;VENTOLIN HFA) 108 (90 BASE) MCG/ACT inhaler Inhale 2 puffs into the lungs every 6 (six) hours as needed for wheezing or shortness of breath.    Historical Provider, MD  amLODipine (NORVASC) 5 MG tablet Take 1 tablet (5 mg total) by mouth at bedtime. 07/14/15   Geradine Girt, DO  aspirin EC 81 MG tablet Take 81 mg by mouth daily.    Historical Provider, MD  Calcium Carbonate-Vitamin D (CALCIUM 600+D) 600-400 MG-UNIT  tablet Take 1 tablet by mouth daily.    Historical Provider, MD  citalopram (CELEXA) 10 MG tablet Take 10 mg by mouth at bedtime.     Historical Provider, MD  fluticasone (FLOVENT HFA) 110 MCG/ACT inhaler Inhale 2 puffs into the lungs 2 (two) times daily. 03/26/15   Rolland Porter, MD  gabapentin (NEURONTIN) 100 MG capsule Take 100-200 mg by mouth 2 (two) times daily. Pt takes one capsule in the morning and two at bedtime.    Historical Provider, MD  HYDROcodone-acetaminophen (NORCO) 5-325 MG tablet Take 1 tablet by mouth every 6 (six) hours as needed for moderate pain. 07/14/15   Geradine Girt, DO  hydrOXYzine (ATARAX/VISTARIL) 25 MG  tablet Take 25 mg by mouth every 4 (four) hours as needed for itching.     Historical Provider, MD  Insulin Detemir (LEVEMIR FLEXTOUCH) 100 UNIT/ML Pen Inject 22 Units into the skin every 12 (twelve) hours.     Historical Provider, MD  labetalol (NORMODYNE) 200 MG tablet Take 1 tablet (200 mg total) by mouth every Monday, Wednesday, and Friday at 8 PM. 07/14/15   Geradine Girt, DO  linagliptin (TRADJENTA) 5 MG TABS tablet Take 5 mg by mouth at bedtime.    Historical Provider, MD  methimazole (TAPAZOLE) 5 MG tablet Take 5 mg by mouth at bedtime.     Historical Provider, MD  multivitamin (RENA-VIT) TABS tablet Take 1 tablet by mouth daily.    Historical Provider, MD  omeprazole (PRILOSEC) 20 MG capsule Take 20 mg by mouth daily.    Historical Provider, MD  ondansetron (ZOFRAN ODT) 4 MG disintegrating tablet Take 1 tablet (4 mg total) by mouth every 8 (eight) hours as needed for nausea or vomiting. 06/17/15   Merryl Hacker, MD  sevelamer carbonate (RENVELA) 800 MG tablet Take 800-1,600 mg by mouth 5 (five) times daily. Pt takes two tablets three times daily with meals and one tablet two times daily with snacks.    Historical Provider, MD  simvastatin (ZOCOR) 5 MG tablet Take 5 mg by mouth at bedtime.    Historical Provider, MD  topiramate (TOPAMAX) 50 MG tablet Take 50 mg by  mouth at bedtime.     Historical Provider, MD   BP 203/109 mmHg  Pulse 90  Temp(Src) 98.1 F (36.7 C) (Oral)  Resp 20  Ht 5\' 6"  (1.676 m)  Wt 206 lb (93.441 kg)  BMI 33.27 kg/m2  SpO2 100%  LMP 07/15/2015 Physical Exam  Constitutional: She appears well-developed and well-nourished. No distress.  HENT:  Head: Normocephalic and atraumatic.  Eyes: Conjunctivae are normal. Right eye exhibits no discharge. Left eye exhibits no discharge.  Neck: Neck supple.  Cardiovascular: Normal rate, regular rhythm and normal heart sounds.  Exam reveals no gallop and no friction rub.   No murmur heard. Fistula LUE with thrill  Pulmonary/Chest: Effort normal and breath sounds normal. No respiratory distress.  Abdominal: Soft. She exhibits no distension. There is no tenderness.  Musculoskeletal: She exhibits no edema or tenderness.  Neurological: She is alert.  Skin: Skin is warm and dry.  Psychiatric: She has a normal mood and affect. Her behavior is normal. Thought content normal.  Nursing note and vitals reviewed.   ED Course  Procedures (including critical care time) Labs Review Labs Reviewed - No data to display  Imaging Review No results found. I have personally reviewed and evaluated these images and lab results as part of my medical decision-making.   EKG Interpretation None      MDM   Final diagnoses:  ESRD (end stage renal disease) (HCC)  Non-intractable vomiting with nausea, vomiting of unspecified type   39yF with abdominal pain, n/v and ESRD with last dialysis over two weeks ago. Her abdominal exam is under whelming. Mild periumbilical tenderness. Afebrile. Nontoxic appearance. Hx of multiple abdodminal surgeries. She is not distended and I doubt SBO or other significant intraabdominal process. She appeared to be sleeping when I initially entered the room and is generally well appearing. Clinically not significantly volume overloaded. She is alert and oriented x4.   My  bigger concern is that she continues to miss dialysis. Her n/v may be attributable to consistently missing dialysis. She  looks well currently though. No vomiting in the ED.   Social work involved. Pt has appointment at her dialysis center tomorrow morning at 0710. If she cannot obtain transportation herself, it can be arranged for her. This was dicussed with patient by myself and by social work. She will be discharged. Stressed importance of compliance.   Virgel Manifold, MD 08/14/15 [phone]

## 2015-08-08 NOTE — ED Notes (Signed)
SOCIAL WORK PRESENT SPEAKING WITH PT AND FAMILY

## 2015-08-09 ENCOUNTER — Emergency Department (HOSPITAL_COMMUNITY): Payer: Medicare Other

## 2015-08-09 ENCOUNTER — Encounter (HOSPITAL_COMMUNITY): Payer: Self-pay

## 2015-08-09 ENCOUNTER — Emergency Department (HOSPITAL_COMMUNITY)
Admission: EM | Admit: 2015-08-09 | Discharge: 2015-08-09 | Disposition: A | Discharge status: Home / Self Care | Payer: Medicare Other | Attending: Emergency Medicine | Admitting: Emergency Medicine

## 2015-08-09 DIAGNOSIS — I2510 Atherosclerotic heart disease of native coronary artery without angina pectoris: Secondary | ICD-10-CM | POA: Insufficient documentation

## 2015-08-09 DIAGNOSIS — E785 Hyperlipidemia, unspecified: Secondary | ICD-10-CM | POA: Insufficient documentation

## 2015-08-09 DIAGNOSIS — E119 Type 2 diabetes mellitus without complications: Secondary | ICD-10-CM | POA: Diagnosis not present

## 2015-08-09 DIAGNOSIS — Z8701 Personal history of pneumonia (recurrent): Secondary | ICD-10-CM | POA: Insufficient documentation

## 2015-08-09 DIAGNOSIS — K219 Gastro-esophageal reflux disease without esophagitis: Secondary | ICD-10-CM | POA: Diagnosis not present

## 2015-08-09 DIAGNOSIS — Z7982 Long term (current) use of aspirin: Secondary | ICD-10-CM | POA: Insufficient documentation

## 2015-08-09 DIAGNOSIS — G629 Polyneuropathy, unspecified: Secondary | ICD-10-CM | POA: Diagnosis not present

## 2015-08-09 DIAGNOSIS — G8929 Other chronic pain: Secondary | ICD-10-CM | POA: Diagnosis not present

## 2015-08-09 DIAGNOSIS — Z992 Dependence on renal dialysis: Secondary | ICD-10-CM | POA: Insufficient documentation

## 2015-08-09 DIAGNOSIS — Z9889 Other specified postprocedural states: Secondary | ICD-10-CM | POA: Insufficient documentation

## 2015-08-09 DIAGNOSIS — R51 Headache: Secondary | ICD-10-CM | POA: Insufficient documentation

## 2015-08-09 DIAGNOSIS — I509 Heart failure, unspecified: Secondary | ICD-10-CM | POA: Insufficient documentation

## 2015-08-09 DIAGNOSIS — N186 End stage renal disease: Secondary | ICD-10-CM | POA: Diagnosis not present

## 2015-08-09 DIAGNOSIS — I159 Secondary hypertension, unspecified: Secondary | ICD-10-CM | POA: Insufficient documentation

## 2015-08-09 DIAGNOSIS — Z862 Personal history of diseases of the blood and blood-forming organs and certain disorders involving the immune mechanism: Secondary | ICD-10-CM | POA: Insufficient documentation

## 2015-08-09 DIAGNOSIS — J45909 Unspecified asthma, uncomplicated: Secondary | ICD-10-CM | POA: Insufficient documentation

## 2015-08-09 DIAGNOSIS — Z7951 Long term (current) use of inhaled steroids: Secondary | ICD-10-CM | POA: Diagnosis not present

## 2015-08-09 DIAGNOSIS — Z794 Long term (current) use of insulin: Secondary | ICD-10-CM | POA: Diagnosis not present

## 2015-08-09 DIAGNOSIS — Z79899 Other long term (current) drug therapy: Secondary | ICD-10-CM | POA: Diagnosis not present

## 2015-08-09 DIAGNOSIS — F329 Major depressive disorder, single episode, unspecified: Secondary | ICD-10-CM | POA: Diagnosis not present

## 2015-08-09 DIAGNOSIS — I120 Hypertensive chronic kidney disease with stage 5 chronic kidney disease or end stage renal disease: Secondary | ICD-10-CM | POA: Diagnosis present

## 2015-08-09 DIAGNOSIS — R519 Headache, unspecified: Secondary | ICD-10-CM

## 2015-08-09 MED ORDER — VALPROATE SODIUM 500 MG/5ML IV SOLN
500.0000 mg | Freq: Once | INTRAVENOUS | Status: DC
  Filled 2015-08-09: qty 5

## 2015-08-09 MED ORDER — CLONIDINE HCL 0.1 MG PO TABS
0.1000 mg | ORAL_TABLET | ORAL | Status: DC | PRN
  Filled 2015-08-09: qty 1

## 2015-08-09 MED ORDER — DIVALPROEX SODIUM 250 MG PO DR TAB
500.0000 mg | DELAYED_RELEASE_TABLET | Freq: Once | ORAL | Status: AC
  Administered 2015-08-09: 500 mg via ORAL
  Filled 2015-08-09: qty 2

## 2015-08-09 MED ORDER — CLONIDINE HCL 0.2 MG PO TABS
0.2000 mg | ORAL_TABLET | Freq: Once | ORAL | Status: AC
  Administered 2015-08-09: 0.2 mg via ORAL
  Filled 2015-08-09: qty 1

## 2015-08-09 MED ORDER — HYDROCODONE-ACETAMINOPHEN 5-325 MG PO TABS
2.0000 | ORAL_TABLET | Freq: Once | ORAL | Status: AC
  Administered 2015-08-09: 2 via ORAL
  Filled 2015-08-09: qty 2

## 2015-08-09 NOTE — ED Provider Notes (Signed)
CSN: [id number]     Arrival date & time 08/09/15  1143 History   First MD Initiated Contact with Patient 08/09/15 1201     Chief Complaint  Patient presents with  . Hypertension     (Consider location/radiation/quality/duration/timing/severity/associated sxs/prior Treatment) HPI Linda Ellison is a 40 y.o. female with multiple medical problems including ESRD on hemodialysis, comes in today for hypertension and headache. Patient states she was at dialysis this morning, completed roughly 3 hours and 15 minutes of dialysis, she began to have a headache and nursing staff noticed she had elevated blood pressure. She received a clonidine at dialysis Center. She reports left-sided weakness and tingling throughout her left face, neck, left arm and left leg. She denies any difficulty speaking, swallowing. She denies any confusion and is carrying on goal oriented speech presently. She is alert and oriented 4. She has not taken medications for blood pressure today due to dialysis appointment. She does report taking her blood pressure medications last night per usual.  Past Medical History  Diagnosis Date  . Asthma   . ESRD on hemodialysis ([place])     started dialysis 01/29/11  . Weight loss   . Loss of appetite   . CHF (congestive heart failure) ([place])   . Hypertension     a. Also with hx of hypotension - has been started on midodrine.  . Hyperlipidemia   . Diabetes mellitus   . Peripheral neuropathy ([place])   . Iron deficiency anemia   . Hyperparathyroidism, secondary ([place])   . Sciatica   . GERD (gastroesophageal reflux disease)   . Adenoma   . Gastroparesis   . Esophageal erosions     a. Per EGD 10/2011.  Marland Kitchen CAD (coronary artery disease)     a. False positive stress echo 06/2012 at [facility] - cath with mild nonobstructive CAD at [facility] (mild luminal irregularities in LAD, moderate diffuse disease in distal RCA).  . History of echocardiogram     Echo 9/14: EF 55-60%, normal wall motion, normal  diastolic function, PASP 21  . Chronic chest pain     a. H/o longstanding atypical CP. Nonobstructive cath 06/2012 and negative VQ.  Marland Kitchen ASCVD (arteriosclerotic cardiovascular disease)   . Anginal pain ([place])   . Peripheral vascular disease ([place])   . PONV (postoperative nausea and vomiting)   . Shortness of breath 07/12/2015  . Depression   . HCAP (healthcare-associated pneumonia) 07/12/2015   Past Surgical History  Procedure Laterality Date  . Cesarean section    . Cholecystectomy  1996  . Av fistula placement  08/2010    Left radiocephalic AVF  . Ligation goretex fistula  01/04/11    Left AVF  . Colonoscopy    . Esophagogastroduodenoscopy  10/19/2011    Dr. Silvano Rusk  . Foot surgery Left   . Tubal ligation    . Left heart catheterization with coronary angiogram N/A 06/27/2012    Procedure: LEFT HEART CATHETERIZATION WITH CORONARY ANGIOGRAM;  Surgeon: Larey Dresser, MD;  Location: [facility] CATH LAB;  Service: Cardiovascular;  Laterality: N/A;  . Fracture surgery Left     left femur  . Dilation and curettage of uterus    . Av fistula placement Right 05/07/2014    Procedure: ARTERIOVENOUS (AV) FISTULA CREATION;  Surgeon: Elam Dutch, MD;  Location: [facility];  Service: Vascular;  Laterality: Right;  . Av fistula placement Right 07/02/2014    Procedure: INSERTION OF ARTERIOVENOUS (AV) GORE-TEX GRAFT ARM;  Surgeon: Elam Dutch,  MD;  Location: [facility] OR;  Service: Vascular;  Laterality: Right;  . Thrombectomy and revision of arterioventous (av) goretex  graft Right 07/16/2014    Procedure: THROMBECTOMY  Right  arm  ARTERIOVENOUS  GORETEX  GRAFT;  Surgeon: Elam Dutch, MD;  Location: [facility];  Service: Vascular;  Laterality: Right;  . Peripheral vascular catheterization Left 08/08/2014    Procedure: Upper Extremity Angiography;  Surgeon: Algernon Huxley, MD;  Location: [facility] CV LAB;  Service: Cardiovascular;  Laterality: Left;  . Peripheral vascular catheterization Left 08/08/2014    Procedure:  Upper Extremity Intervention;  Surgeon: Algernon Huxley, MD;  Location: [place] CV LAB;  Service: Cardiovascular;  Laterality: Left;  . Ligation of arteriovenous  fistula Left 08/21/2014    Procedure: LIGATION OF ARTERIOVENOUS  FISTULA;  Surgeon: Algernon Huxley, MD;  Location: [facility] ORS;  Service: Vascular;  Laterality: Left;   Family History  Problem Relation Age of Onset  . Heart disease Mother     heart attack at 67 y/o-infected valve  . Kidney disease Mother     was a dialysis patient  . Heart disease Brother   . Kidney disease Maternal Grandmother     pre-dialysis  . Kidney failure Paternal Uncle   . Kidney failure Paternal Aunt   . Diabetes Father   . Hypertension Father   . Colon cancer Neg Hx   . Arthritis Brother   . Diabetes Maternal Aunt   . Hypertension Maternal Aunt   . Diabetes Paternal Aunt   . Heart disease Paternal Aunt   . Hypertension Paternal Aunt    Social History  Substance Use Topics  . Smoking status: Never Smoker   . Smokeless tobacco: Never Used  . Alcohol Use: No   OB History    No data available     Review of Systems A 10 point review of systems was completed and was negative except for pertinent positives and negatives as mentioned in the history of present illness     Allergies  Ciprofloxacin; Fluocinolone; Pineapple; Strawberry extract; Tylenol; Adhesive; Chlorhexidine gluconate; Clindamycin; Oxycodone; and Shrimp  Home Medications   Prior to Admission medications   Medication Sig Start Date End Date Taking? Authorizing Provider  albuterol (PROVENTIL HFA;VENTOLIN HFA) 108 (90 BASE) MCG/ACT inhaler Inhale 2 puffs into the lungs every 6 (six) hours as needed for wheezing or shortness of breath.    Historical Provider, MD  amLODipine (NORVASC) 5 MG tablet Take 1 tablet (5 mg total) by mouth at bedtime. 07/14/15   Geradine Girt, DO  aspirin EC 81 MG tablet Take 81 mg by mouth daily.    Historical Provider, MD  Calcium Carbonate-Vitamin D  (CALCIUM 600+D) 600-400 MG-UNIT tablet Take 1 tablet by mouth daily.    Historical Provider, MD  citalopram (CELEXA) 10 MG tablet Take 10 mg by mouth at bedtime.     Historical Provider, MD  fluticasone (FLOVENT HFA) 110 MCG/ACT inhaler Inhale 2 puffs into the lungs 2 (two) times daily. 03/26/15   Rolland Porter, MD  gabapentin (NEURONTIN) 100 MG capsule Take 100-200 mg by mouth 2 (two) times daily. Pt takes one capsule in the morning and two at bedtime.    Historical Provider, MD  HYDROcodone-acetaminophen (NORCO) 5-325 MG tablet Take 1 tablet by mouth every 6 (six) hours as needed for moderate pain. 07/14/15   Geradine Girt, DO  hydrOXYzine (ATARAX/VISTARIL) 25 MG tablet Take 25 mg by mouth every 4 (four) hours as needed for  itching.     Historical Provider, MD  Insulin Detemir (LEVEMIR FLEXTOUCH) 100 UNIT/ML Pen Inject 22 Units into the skin every 12 (twelve) hours.     Historical Provider, MD  labetalol (NORMODYNE) 200 MG tablet Take 1 tablet (200 mg total) by mouth every Monday, Wednesday, and Friday at 8 PM. 07/14/15   Geradine Girt, DO  linagliptin (TRADJENTA) 5 MG TABS tablet Take 5 mg by mouth at bedtime.    Historical Provider, MD  methimazole (TAPAZOLE) 5 MG tablet Take 5 mg by mouth at bedtime.     Historical Provider, MD  multivitamin (RENA-VIT) TABS tablet Take 1 tablet by mouth daily.    Historical Provider, MD  omeprazole (PRILOSEC) 20 MG capsule Take 20 mg by mouth daily.    Historical Provider, MD  ondansetron (ZOFRAN ODT) 4 MG disintegrating tablet Take 1 tablet (4 mg total) by mouth every 8 (eight) hours as needed for nausea or vomiting. 06/17/15   Merryl Hacker, MD  ondansetron (ZOFRAN) 4 MG tablet Take 1 tablet (4 mg total) by mouth every 6 (six) hours. 08/08/15   Virgel Manifold, MD  sevelamer carbonate (RENVELA) 800 MG tablet Take 800-1,600 mg by mouth 5 (five) times daily. Pt takes two tablets three times daily with meals and one tablet two times daily with snacks.    Historical  Provider, MD  simvastatin (ZOCOR) 5 MG tablet Take 5 mg by mouth at bedtime.    Historical Provider, MD  topiramate (TOPAMAX) 50 MG tablet Take 50 mg by mouth at bedtime.     Historical Provider, MD   BP 172/90 mmHg  Pulse 74  Temp(Src) 98.1 F (36.7 C) (Oral)  Resp 15  Ht 5\' 6"  (1.676 m)  Wt 91.5 kg  BMI 32.57 kg/m2  SpO2 98%  LMP 08/09/2015 Physical Exam  Constitutional: She is oriented to person, place, and time. She appears well-developed and well-nourished.  HENT:  Head: Normocephalic and atraumatic.  Mouth/Throat: Oropharynx is clear and moist.  Eyes: Conjunctivae are normal. Pupils are equal, round, and reactive to light. Right eye exhibits no discharge. Left eye exhibits no discharge. No scleral icterus.  Neck: Neck supple.  Cardiovascular: Normal rate, regular rhythm and normal heart sounds.   Pulmonary/Chest: Effort normal and breath sounds normal. No respiratory distress. She has no wheezes. She has no rales.  Abdominal: Soft. There is no tenderness.  Musculoskeletal: She exhibits no tenderness.  Neurological: She is alert and oriented to person, place, and time.  Cranial Nerves II-XII grossly intact. Motor strength 5/5 and equal in all 4 extremities. Sensation is intact to light touch, however patient reports paresthesias diffusely throughout entire left side of her body. Gait is baseline  Skin: Skin is warm and dry. No rash noted.  Psychiatric: She has a normal mood and affect.  Nursing note and vitals reviewed.   ED Course  Procedures (including critical care time) Labs Review Labs Reviewed - No data to display  Imaging Review Ct Head Wo Contrast  08/09/2015  CLINICAL DATA:  40 year old female with acute headache and hypertension. EXAM: CT HEAD WITHOUT CONTRAST TECHNIQUE: Contiguous axial images were obtained from the base of the skull through the vertex without intravenous contrast. COMPARISON:  05/02/2015 FINDINGS: No intracranial abnormalities are identified,  including mass lesion or mass effect, hydrocephalus, extra-axial fluid collection, midline shift, hemorrhage, or acute infarction. The visualized bony calvarium is unremarkable. IMPRESSION: Unremarkable noncontrast head CT. Electronically Signed   By: Margarette Canada M.D.   On: 08/09/2015  14:04   I have personally reviewed and evaluated these images and lab results as part of my medical decision-making.   EKG Interpretation None     Meds given in ED:  Medications  cloNIDine (CATAPRES) tablet 0.1 mg (not administered)  cloNIDine (CATAPRES) tablet 0.2 mg (0.2 mg Oral Given 08/09/15 1419)  HYDROcodone-acetaminophen (NORCO/VICODIN) 5-325 MG per tablet 2 tablet (2 tablets Oral Given 08/09/15 1419)  divalproex (DEPAKOTE) DR tablet 500 mg (500 mg Oral Given 08/09/15 1518)    New Prescriptions   No medications on file   Filed Vitals:   08/09/15 1545 08/09/15 1600 08/09/15 1615 08/09/15 1630  BP: 164/91 167/93 169/88 172/90  Pulse: 80 83 78 74  Temp:      TempSrc:      Resp: 14 15 15 15   Height:      Weight:      SpO2: 95% 97% 97% 98%    MDM  KELCEE BESS is a 40 y.o. female with multiple medical problems including end-stage renal disease on hemodialysis, hypertension. She was recently evaluated in emergency department 2 days ago for abdominal pain. She reports today complaining of headache ongoing for the past week. She reports she was sent by her dialysis center to emergency department for evaluation of elevated blood pressure and ongoing headache. Upon review of previous vital signs, patient's blood pressure has been elevated for the past 3 days. She does complain of some paresthesias but ultimately has a nonfocal neuro exam. We'll treat the patient's blood pressure with clonidine, headache with pain medicine and Depakote in emergency department. Also plan to obtain CT head to further elucidate the source of patient's headache related to blood pressure.  CT head is negative. No evidence of  hypertensive emergency. Patient's blood pressure responds well to clonidine in emergent department. Most recent blood pressure 168/94. She is sleeping comfortably in exam room in no apparent distress. Appropriate for outpatient follow-up. Discussed strict return precautions. Verbalizes understanding and agrees with this plan. Prior to patient discharge, I discussed and reviewed this case with Dr. Audie Pinto   Final diagnoses:  Secondary hypertension, unspecified  Nonintractable headache, unspecified chronicity pattern, unspecified headache type        Comer Locket, PA-C 08/09/15 1650  Leonard Schwartz, MD 08/10/15 [id number]

## 2015-08-09 NOTE — ED Notes (Signed)
Patient here with 1 week of headache and sent from dialysis center following treatment because BP high. Received clonidine at dialysis center. No CP, complains of ongoing headache

## 2015-08-09 NOTE — Discharge Instructions (Signed)
There is not appear to be an emergent cause her symptoms at this time. Your CT scan was negative and showed no immediate problems. Please continue taking your blood pressure medication as prescribed and follow-up with your doctor for further management of your high blood pressure. Return to ED for new or worsening symptoms.  General Headache Without Cause A headache is pain or discomfort felt around the head or neck area. The specific cause of a headache may not be found. There are many causes and types of headaches. A few common ones are:  Tension headaches.  Migraine headaches.  Cluster headaches.  Chronic daily headaches. HOME CARE INSTRUCTIONS  Watch your condition for any changes. Take these steps to help with your condition: Managing Pain  Take over-the-counter and prescription medicines only as told by your health care provider.  Lie down in a dark, quiet room when you have a headache.  If directed, apply ice to the head and neck area:  Put ice in a plastic bag.  Place a towel between your skin and the bag.  Leave the ice on for 20 minutes, 2-3 times per day.  Use a heating pad or hot shower to apply heat to the head and neck area as told by your health care provider.  Keep lights dim if bright lights bother you or make your headaches worse. Eating and Drinking  Eat meals on a regular schedule.  Limit alcohol use.  Decrease the amount of caffeine you drink, or stop drinking caffeine. General Instructions  Keep all follow-up visits as told by your health care provider. This is important.  Keep a headache journal to help find out what may trigger your headaches. For example, write down:  What you eat and drink.  How much sleep you get.  Any change to your diet or medicines.  Try massage or other relaxation techniques.  Limit stress.  Sit up straight, and do not tense your muscles.  Do not use tobacco products, including cigarettes, chewing tobacco, or  e-cigarettes. If you need help quitting, ask your health care provider.  Exercise regularly as told by your health care provider.  Sleep on a regular schedule. Get 7-9 hours of sleep, or the amount recommended by your health care provider. SEEK MEDICAL CARE IF:   Your symptoms are not helped by medicine.  You have a headache that is different from the usual headache.  You have nausea or you vomit.  You have a fever. SEEK IMMEDIATE MEDICAL CARE IF:   Your headache becomes severe.  You have repeated vomiting.  You have a stiff neck.  You have a loss of vision.  You have problems with speech.  You have pain in the eye or ear.  You have muscular weakness or loss of muscle control.  You lose your balance or have trouble walking.  You feel faint or pass out.  You have confusion.   This information is not intended to replace advice given to you by your health care provider. Make sure you discuss any questions you have with your health care provider.   Document Released: 03/22/2005 Document Revised: 12/11/2014 Document Reviewed: 07/15/2014 Elsevier Interactive Patient Education [employer].

## 2015-08-26 DIAGNOSIS — G43009 Migraine without aura, not intractable, without status migrainosus: Secondary | ICD-10-CM | POA: Insufficient documentation

## 2015-08-31 DIAGNOSIS — I509 Heart failure, unspecified: Secondary | ICD-10-CM | POA: Diagnosis not present

## 2015-08-31 DIAGNOSIS — G629 Polyneuropathy, unspecified: Secondary | ICD-10-CM | POA: Diagnosis not present

## 2015-08-31 DIAGNOSIS — Z862 Personal history of diseases of the blood and blood-forming organs and certain disorders involving the immune mechanism: Secondary | ICD-10-CM | POA: Insufficient documentation

## 2015-08-31 DIAGNOSIS — Z9889 Other specified postprocedural states: Secondary | ICD-10-CM | POA: Insufficient documentation

## 2015-08-31 DIAGNOSIS — G8929 Other chronic pain: Secondary | ICD-10-CM | POA: Diagnosis not present

## 2015-08-31 DIAGNOSIS — I25119 Atherosclerotic heart disease of native coronary artery with unspecified angina pectoris: Secondary | ICD-10-CM | POA: Diagnosis not present

## 2015-08-31 DIAGNOSIS — Z7951 Long term (current) use of inhaled steroids: Secondary | ICD-10-CM | POA: Insufficient documentation

## 2015-08-31 DIAGNOSIS — Z7984 Long term (current) use of oral hypoglycemic drugs: Secondary | ICD-10-CM | POA: Insufficient documentation

## 2015-08-31 DIAGNOSIS — E785 Hyperlipidemia, unspecified: Secondary | ICD-10-CM | POA: Insufficient documentation

## 2015-08-31 DIAGNOSIS — K219 Gastro-esophageal reflux disease without esophagitis: Secondary | ICD-10-CM | POA: Diagnosis not present

## 2015-08-31 DIAGNOSIS — J45901 Unspecified asthma with (acute) exacerbation: Secondary | ICD-10-CM | POA: Insufficient documentation

## 2015-08-31 DIAGNOSIS — F329 Major depressive disorder, single episode, unspecified: Secondary | ICD-10-CM | POA: Diagnosis not present

## 2015-08-31 DIAGNOSIS — I120 Hypertensive chronic kidney disease with stage 5 chronic kidney disease or end stage renal disease: Secondary | ICD-10-CM | POA: Insufficient documentation

## 2015-08-31 DIAGNOSIS — N186 End stage renal disease: Secondary | ICD-10-CM | POA: Diagnosis not present

## 2015-08-31 DIAGNOSIS — Z86018 Personal history of other benign neoplasm: Secondary | ICD-10-CM | POA: Diagnosis not present

## 2015-08-31 DIAGNOSIS — R05 Cough: Secondary | ICD-10-CM | POA: Diagnosis present

## 2015-08-31 DIAGNOSIS — Z79899 Other long term (current) drug therapy: Secondary | ICD-10-CM | POA: Diagnosis not present

## 2015-08-31 DIAGNOSIS — Z8739 Personal history of other diseases of the musculoskeletal system and connective tissue: Secondary | ICD-10-CM | POA: Insufficient documentation

## 2015-08-31 DIAGNOSIS — Z8701 Personal history of pneumonia (recurrent): Secondary | ICD-10-CM | POA: Insufficient documentation

## 2015-08-31 DIAGNOSIS — Z7982 Long term (current) use of aspirin: Secondary | ICD-10-CM | POA: Insufficient documentation

## 2015-08-31 DIAGNOSIS — E1122 Type 2 diabetes mellitus with diabetic chronic kidney disease: Secondary | ICD-10-CM | POA: Insufficient documentation

## 2015-09-01 ENCOUNTER — Emergency Department (HOSPITAL_COMMUNITY): Payer: Medicare Other

## 2015-09-01 ENCOUNTER — Emergency Department (HOSPITAL_COMMUNITY)
Admission: EM | Admit: 2015-09-01 | Discharge: 2015-09-01 | Disposition: A | Discharge status: Home / Self Care | Payer: Medicare Other | Attending: Emergency Medicine | Admitting: Emergency Medicine

## 2015-09-01 ENCOUNTER — Encounter (HOSPITAL_COMMUNITY): Payer: Self-pay | Admitting: *Deleted

## 2015-09-01 DIAGNOSIS — J45901 Unspecified asthma with (acute) exacerbation: Secondary | ICD-10-CM | POA: Diagnosis not present

## 2015-09-01 DIAGNOSIS — J40 Bronchitis, not specified as acute or chronic: Secondary | ICD-10-CM

## 2015-09-01 LAB — TROPONIN I: TROPONIN I: 0.03 ng/mL (ref <0.031)

## 2015-09-01 LAB — CBC WITH DIFFERENTIAL/PLATELET
Basophils Absolute: 0 10*3/uL (ref 0.0–0.1)
Basophils Relative: 0 %
EOS ABS: 0.1 10*3/uL (ref 0.0–0.7)
EOS PCT: 2 %
HCT: 34.4 % — ABNORMAL LOW (ref 36.0–46.0)
Hemoglobin: 10.5 g/dL — ABNORMAL LOW (ref 12.0–15.0)
LYMPHS PCT: 21 %
Lymphs Abs: 1.9 10*3/uL (ref 0.7–4.0)
MCH: 26.4 pg (ref 26.0–34.0)
MCHC: 30.5 g/dL (ref 30.0–36.0)
MCV: 86.6 fL (ref 78.0–100.0)
MONO ABS: 0.7 10*3/uL (ref 0.1–1.0)
MONOS PCT: 8 %
Neutro Abs: 6.2 10*3/uL (ref 1.7–7.7)
Neutrophils Relative %: 69 %
PLATELETS: 292 10*3/uL (ref 150–400)
RBC: 3.97 MIL/uL (ref 3.87–5.11)
RDW: 16 % — ABNORMAL HIGH (ref 11.5–15.5)
WBC: 8.9 10*3/uL (ref 4.0–10.5)

## 2015-09-01 LAB — COMPREHENSIVE METABOLIC PANEL
ALT: 14 U/L (ref 14–54)
ANION GAP: 11 (ref 5–15)
AST: 12 U/L — ABNORMAL LOW (ref 15–41)
Albumin: 2.8 g/dL — ABNORMAL LOW (ref 3.5–5.0)
Alkaline Phosphatase: 54 U/L (ref 38–126)
BUN: 56 mg/dL — ABNORMAL HIGH (ref 6–20)
CALCIUM: 7.9 mg/dL — AB (ref 8.9–10.3)
CHLORIDE: 105 mmol/L (ref 101–111)
CO2: 20 mmol/L — ABNORMAL LOW (ref 22–32)
Creatinine, Ser: 13.17 mg/dL — ABNORMAL HIGH (ref 0.44–1.00)
GFR, EST AFRICAN AMERICAN: 4 mL/min — AB (ref >60)
GFR, EST NON AFRICAN AMERICAN: 3 mL/min — AB (ref >60)
Glucose, Bld: 90 mg/dL (ref 65–99)
Potassium: 4.5 mmol/L (ref 3.5–5.1)
SODIUM: 136 mmol/L (ref 135–145)
Total Bilirubin: 0.7 mg/dL (ref 0.3–1.2)
Total Protein: 6.3 g/dL — ABNORMAL LOW (ref 6.5–8.1)

## 2015-09-01 MED ORDER — LORAZEPAM 2 MG/ML IJ SOLN
0.5000 mg | INTRAMUSCULAR | Status: DC | PRN
Start: 2015-09-01 — End: 2015-09-01

## 2015-09-01 MED ORDER — PREDNISONE 20 MG PO TABS: 40.0000 mg | ORAL_TABLET | Freq: Every day | ORAL | Status: DC

## 2015-09-01 MED ORDER — ALBUTEROL SULFATE (2.5 MG/3ML) 0.083% IN NEBU
5.0000 mg | INHALATION_SOLUTION | Freq: Once | RESPIRATORY_TRACT | Status: AC
  Administered 2015-09-01: 5 mg via RESPIRATORY_TRACT
  Filled 2015-09-01: qty 6

## 2015-09-01 MED ORDER — HYDROCODONE-HOMATROPINE 5-1.5 MG/5ML PO SYRP: 5.0000 mL | ORAL_SOLUTION | Freq: Four times a day (QID) | ORAL | Status: DC | PRN

## 2015-09-01 MED ORDER — ALBUTEROL SULFATE HFA 108 (90 BASE) MCG/ACT IN AERS: 2.0000 | INHALATION_SPRAY | RESPIRATORY_TRACT | Status: DC | PRN

## 2015-09-01 MED ORDER — ALBUTEROL SULFATE (2.5 MG/3ML) 0.083% IN NEBU
5.0000 mg | INHALATION_SOLUTION | Freq: Once | RESPIRATORY_TRACT | Status: AC
  Administered 2015-09-01: 2.5 mg via RESPIRATORY_TRACT

## 2015-09-01 MED ORDER — ALBUTEROL SULFATE (2.5 MG/3ML) 0.083% IN NEBU
INHALATION_SOLUTION | RESPIRATORY_TRACT | Status: AC
  Administered 2015-09-01: 2.5 mg via RESPIRATORY_TRACT
  Filled 2015-09-01: qty 3

## 2015-09-01 NOTE — ED Notes (Signed)
Patient presents with c/o cough for 2 weeks (greenish sputum)  C/o feeling sore in her ribs and abd from coughing

## 2015-09-01 NOTE — ED Notes (Signed)
Pt comfortable with d/c and f/u instructions. No Rx.

## 2015-09-01 NOTE — Discharge Instructions (Signed)

## 2015-09-01 NOTE — ED Provider Notes (Signed)
CSN: [id number]     Arrival date & time 08/31/15  2351 History   First MD Initiated Contact with Patient 09/01/15 0326     Chief Complaint  Patient presents with  . Cough  . Shortness of Breath     (Consider location/radiation/quality/duration/timing/severity/associated sxs/prior Treatment) HPI Comments: Patient presents to the emergency part for evaluation of shortness of breath. Patient reports that she has been experiencing cough and chest congestion with mild shortness of breath for approximately 2 weeks. She does have a history of asthma. She has not had any fever. Patient does not report persistent chest pain but has had soreness of the ribs that is present when she coughs.  Patient is a 40 y.o. female presenting with cough and shortness of breath.  Cough Associated symptoms: shortness of breath   Shortness of Breath Associated symptoms: cough     Past Medical History  Diagnosis Date  . Asthma   . ESRD on hemodialysis ([place])     started dialysis 01/29/11  . Weight loss   . Loss of appetite   . CHF (congestive heart failure) ([place])   . Hypertension     a. Also with hx of hypotension - has been started on midodrine.  . Hyperlipidemia   . Diabetes mellitus   . Peripheral neuropathy ([place])   . Iron deficiency anemia   . Hyperparathyroidism, secondary ([place])   . Sciatica   . GERD (gastroesophageal reflux disease)   . Adenoma   . Gastroparesis   . Esophageal erosions     a. Per EGD 10/2011.  Marland Kitchen CAD (coronary artery disease)     a. False positive stress echo 06/2012 at [facility] - cath with mild nonobstructive CAD at [facility] (mild luminal irregularities in LAD, moderate diffuse disease in distal RCA).  . History of echocardiogram     Echo 9/14: EF 55-60%, normal wall motion, normal diastolic function, PASP 21  . Chronic chest pain     a. H/o longstanding atypical CP. Nonobstructive cath 06/2012 and negative VQ.  Marland Kitchen ASCVD (arteriosclerotic cardiovascular disease)   . Anginal pain  ([place])   . Peripheral vascular disease ([place])   . PONV (postoperative nausea and vomiting)   . Shortness of breath 07/12/2015  . Depression   . HCAP (healthcare-associated pneumonia) 07/12/2015   Past Surgical History  Procedure Laterality Date  . Cesarean section    . Cholecystectomy  1996  . Av fistula placement  08/2010    Left radiocephalic AVF  . Ligation goretex fistula  01/04/11    Left AVF  . Colonoscopy    . Esophagogastroduodenoscopy  10/19/2011    Dr. Silvano Rusk  . Foot surgery Left   . Tubal ligation    . Left heart catheterization with coronary angiogram N/A 06/27/2012    Procedure: LEFT HEART CATHETERIZATION WITH CORONARY ANGIOGRAM;  Surgeon: Larey Dresser, MD;  Location: [facility] CATH LAB;  Service: Cardiovascular;  Laterality: N/A;  . Fracture surgery Left     left femur  . Dilation and curettage of uterus    . Av fistula placement Right 05/07/2014    Procedure: ARTERIOVENOUS (AV) FISTULA CREATION;  Surgeon: Elam Dutch, MD;  Location: [place];  Service: Vascular;  Laterality: Right;  . Av fistula placement Right 07/02/2014    Procedure: INSERTION OF ARTERIOVENOUS (AV) GORE-TEX GRAFT ARM;  Surgeon: Elam Dutch, MD;  Location: [facility] OR;  Service: Vascular;  Laterality: Right;  . Thrombectomy and revision of arterioventous (av) goretex  graft Right 07/16/2014  Procedure: THROMBECTOMY  Right  arm  ARTERIOVENOUS  GORETEX  GRAFT;  Surgeon: Elam Dutch, MD;  Location: [place];  Service: Vascular;  Laterality: Right;  . Peripheral vascular catheterization Left 08/08/2014    Procedure: Upper Extremity Angiography;  Surgeon: Algernon Huxley, MD;  Location: [place] [facility] LAB;  Service: Cardiovascular;  Laterality: Left;  . Peripheral vascular catheterization Left 08/08/2014    Procedure: Upper Extremity Intervention;  Surgeon: Algernon Huxley, MD;  Location: [place] [facility] LAB;  Service: Cardiovascular;  Laterality: Left;  . Ligation of arteriovenous  fistula Left 08/21/2014     Procedure: LIGATION OF ARTERIOVENOUS  FISTULA;  Surgeon: Algernon Huxley, MD;  Location: [facility] ORS;  Service: Vascular;  Laterality: Left;   Family History  Problem Relation Age of Onset  . Heart disease Mother     heart attack at 54 y/o-infected valve  . Kidney disease Mother     was a dialysis patient  . Heart disease Brother   . Kidney disease Maternal Grandmother     pre-dialysis  . Kidney failure Paternal Uncle   . Kidney failure Paternal Aunt   . Diabetes Father   . Hypertension Father   . Colon cancer Neg Hx   . Arthritis Brother   . Diabetes Maternal Aunt   . Hypertension Maternal Aunt   . Diabetes Paternal Aunt   . Heart disease Paternal Aunt   . Hypertension Paternal Aunt    Social History  Substance Use Topics  . Smoking status: Never Smoker   . Smokeless tobacco: Never Used  . Alcohol Use: No   OB History    No data available     Review of Systems  Respiratory: Positive for cough and shortness of breath.   All other systems reviewed and are negative.     Allergies  Ciprofloxacin; Fluocinolone; Pineapple; Strawberry extract; Tylenol; Adhesive; Chlorhexidine gluconate; Clindamycin; Oxycodone; and Shrimp  Home Medications   Prior to Admission medications   Medication Sig Start Date End Date Taking? Authorizing Provider  albuterol (PROVENTIL HFA;VENTOLIN HFA) 108 (90 BASE) MCG/ACT inhaler Inhale 2 puffs into the lungs every 6 (six) hours as needed for wheezing or shortness of breath.    Historical Provider, MD  albuterol (PROVENTIL HFA;VENTOLIN HFA) 108 (90 Base) MCG/ACT inhaler Inhale 2 puffs into the lungs every 4 (four) hours as needed for wheezing or shortness of breath. 09/01/15   Orpah Greek, MD  amLODipine (NORVASC) 5 MG tablet Take 1 tablet (5 mg total) by mouth at bedtime. 07/14/15   Geradine Girt, DO  aspirin EC 81 MG tablet Take 81 mg by mouth daily.    Historical Provider, MD  Calcium Carbonate-Vitamin D (CALCIUM 600+D) 600-400 MG-UNIT tablet  Take 1 tablet by mouth daily.    Historical Provider, MD  citalopram (CELEXA) 10 MG tablet Take 10 mg by mouth at bedtime.     Historical Provider, MD  fluticasone (FLOVENT HFA) 110 MCG/ACT inhaler Inhale 2 puffs into the lungs 2 (two) times daily. 03/26/15   Rolland Porter, MD  gabapentin (NEURONTIN) 100 MG capsule Take 100-200 mg by mouth 2 (two) times daily. Pt takes one capsule in the morning and two at bedtime.    Historical Provider, MD  HYDROcodone-acetaminophen (NORCO) 5-325 MG tablet Take 1 tablet by mouth every 6 (six) hours as needed for moderate pain. 07/14/15   Geradine Girt, DO  HYDROcodone-homatropine (HYCODAN) 5-1.5 MG/5ML syrup Take 5 mLs by mouth every 6 (six) hours as needed  for cough. 09/01/15   Orpah Greek, MD  hydrOXYzine (ATARAX/VISTARIL) 25 MG tablet Take 25 mg by mouth every 4 (four) hours as needed for itching.     Historical Provider, MD  Insulin Detemir (LEVEMIR FLEXTOUCH) 100 UNIT/ML Pen Inject 22 Units into the skin every 12 (twelve) hours.     Historical Provider, MD  labetalol (NORMODYNE) 200 MG tablet Take 1 tablet (200 mg total) by mouth every Monday, Wednesday, and Friday at 8 PM. 07/14/15   Geradine Girt, DO  linagliptin (TRADJENTA) 5 MG TABS tablet Take 5 mg by mouth at bedtime.    Historical Provider, MD  methimazole (TAPAZOLE) 5 MG tablet Take 5 mg by mouth at bedtime.     Historical Provider, MD  multivitamin (RENA-VIT) TABS tablet Take 1 tablet by mouth daily.    Historical Provider, MD  omeprazole (PRILOSEC) 20 MG capsule Take 20 mg by mouth daily.    Historical Provider, MD  ondansetron (ZOFRAN ODT) 4 MG disintegrating tablet Take 1 tablet (4 mg total) by mouth every 8 (eight) hours as needed for nausea or vomiting. 06/17/15   Merryl Hacker, MD  ondansetron (ZOFRAN) 4 MG tablet Take 1 tablet (4 mg total) by mouth every 6 (six) hours. 08/08/15   Virgel Manifold, MD  predniSONE (DELTASONE) 20 MG tablet Take 2 tablets (40 mg total) by mouth daily with  breakfast. 09/01/15   Orpah Greek, MD  sevelamer carbonate (RENVELA) 800 MG tablet Take 800-1,600 mg by mouth 5 (five) times daily. Pt takes two tablets three times daily with meals and one tablet two times daily with snacks.    Historical Provider, MD  simvastatin (ZOCOR) 5 MG tablet Take 5 mg by mouth at bedtime.    Historical Provider, MD  topiramate (TOPAMAX) 50 MG tablet Take 50 mg by mouth at bedtime.     Historical Provider, MD   BP 188/110 mmHg  Pulse 96  Temp(Src) 98.7 F (37.1 C) (Oral)  Resp 16  Ht 5\' 6"  (1.676 m)  Wt 205 lb (92.987 kg)  BMI 33.10 kg/m2  SpO2 96%  LMP 08/09/2015 Physical Exam  Constitutional: She is oriented to person, place, and time. She appears well-developed and well-nourished. No distress.  HENT:  Head: Normocephalic and atraumatic.  Right Ear: Hearing normal.  Left Ear: Hearing normal.  Nose: Nose normal.  Mouth/Throat: Oropharynx is clear and moist and mucous membranes are normal.  Eyes: Conjunctivae and EOM are normal. Pupils are equal, round, and reactive to light.  Neck: Normal range of motion. Neck supple.  Cardiovascular: Regular rhythm, S1 normal and S2 normal.  Exam reveals no gallop and no friction rub.   No murmur heard. Pulmonary/Chest: Effort normal. No respiratory distress. She has decreased breath sounds. She has wheezes. She exhibits no tenderness.  Abdominal: Soft. Normal appearance and bowel sounds are normal. There is no hepatosplenomegaly. There is no tenderness. There is no rebound, no guarding, no tenderness at McBurney's point and negative Murphy's sign. No hernia.  Musculoskeletal: Normal range of motion.  Neurological: She is alert and oriented to person, place, and time. She has normal strength. No cranial nerve deficit or sensory deficit. Coordination normal. GCS eye subscore is 4. GCS verbal subscore is 5. GCS motor subscore is 6.  Skin: Skin is warm, dry and intact. No rash noted. No cyanosis.  Psychiatric: She has  a normal mood and affect. Her speech is normal and behavior is normal. Thought content normal.  Nursing note and vitals  reviewed.   ED Course  Procedures (including critical care time) Labs Review Labs Reviewed  CBC WITH DIFFERENTIAL/PLATELET - Abnormal; Notable for the following:    Hemoglobin 10.5 (*)    HCT 34.4 (*)    RDW 16.0 (*)    All other components within normal limits  COMPREHENSIVE METABOLIC PANEL - Abnormal; Notable for the following:    CO2 20 (*)    BUN 56 (*)    Creatinine, Ser 13.17 (*)    Calcium 7.9 (*)    Total Protein 6.3 (*)    Albumin 2.8 (*)    AST 12 (*)    GFR calc non Af Amer 3 (*)    GFR calc Af Amer 4 (*)    All other components within normal limits  TROPONIN I    Imaging Review Dg Chest 2 View  09/01/2015  CLINICAL DATA:  Chest pain and pressure for 2 weeks, intermittent, progressed today. Shortness of breath. EXAM: CHEST  2 VIEW COMPARISON:  Radiographs and CT April 2017, radiographs 06/17/2015 FINDINGS: Worsening bibasilar interstitial opacities, likely pulmonary edema. Small bilateral pleural effusions, left greater than right. The heart size and mediastinal contours are unchanged. No confluent airspace disease. No pneumothorax. IMPRESSION: Interstitial bibasilar opacities consistent likely worsening pulmonary edema. This appears waxing and waning within April 2017, currently increased. Small pleural effusions again seen. Electronically Signed   By: Jeb Levering M.D.   On: 09/01/2015 01:02   I have personally reviewed and evaluated these images and lab results as part of my medical decision-making.   EKG Interpretation   Date/Time:  Sunday Aug 31 2015 23:58:58 EDT Ventricular Rate:  101 PR Interval:  128 QRS Duration: 70 QT Interval:  382 QTC Calculation: 495 R Axis:   12 Text Interpretation:  Sinus tachycardia Otherwise normal ECG Confirmed by  Avalon Coppinger  MD, Darely Becknell [id number]) on 09/01/2015 3:49:28 AM      MDM   Final diagnoses:   Bronchitis    Presents to the ER for evaluation of cough and shortness of breath. Patient does report a previous history of asthma. She did have mild bronchospasm and decreased breath sounds bilaterally at arrival but is not hypoxic. Patient has nonischemic, normal EKG. Troponin negative. Based on the fact that she has wheezing and decreased breath sounds, source of shortness of breath is felt to be secondary to bronchospasm. She has had persistent cough for 2 weeks but chest x-ray does not show pneumonia. Patient had some improvement with albuterol nebulizer. She'll be initiated on prednisone, continue bronchodilator therapy. Vicodin as needed for cough. Follow-up with PCP.    Orpah Greek, MD 09/01/15 [phone]

## 2015-09-08 ENCOUNTER — Emergency Department (HOSPITAL_COMMUNITY): Payer: Medicare Other

## 2015-09-08 ENCOUNTER — Encounter (HOSPITAL_COMMUNITY): Payer: Self-pay | Admitting: Emergency Medicine

## 2015-09-08 ENCOUNTER — Inpatient Hospital Stay (HOSPITAL_COMMUNITY)
Admission: EM | Admit: 2015-09-08 | Discharge: 2015-09-11 | DRG: 682 | Disposition: A | Discharge status: Home / Self Care | Payer: Medicare Other | Attending: Internal Medicine | Admitting: Internal Medicine

## 2015-09-08 DIAGNOSIS — E1151 Type 2 diabetes mellitus with diabetic peripheral angiopathy without gangrene: Secondary | ICD-10-CM | POA: Diagnosis present

## 2015-09-08 DIAGNOSIS — R197 Diarrhea, unspecified: Secondary | ICD-10-CM | POA: Diagnosis present

## 2015-09-08 DIAGNOSIS — E669 Obesity, unspecified: Secondary | ICD-10-CM | POA: Diagnosis present

## 2015-09-08 DIAGNOSIS — E1143 Type 2 diabetes mellitus with diabetic autonomic (poly)neuropathy: Secondary | ICD-10-CM | POA: Diagnosis not present

## 2015-09-08 DIAGNOSIS — E109 Type 1 diabetes mellitus without complications: Secondary | ICD-10-CM | POA: Diagnosis present

## 2015-09-08 DIAGNOSIS — E785 Hyperlipidemia, unspecified: Secondary | ICD-10-CM | POA: Diagnosis present

## 2015-09-08 DIAGNOSIS — K219 Gastro-esophageal reflux disease without esophagitis: Secondary | ICD-10-CM | POA: Diagnosis present

## 2015-09-08 DIAGNOSIS — G8929 Other chronic pain: Secondary | ICD-10-CM | POA: Diagnosis present

## 2015-09-08 DIAGNOSIS — E119 Type 2 diabetes mellitus without complications: Secondary | ICD-10-CM | POA: Diagnosis not present

## 2015-09-08 DIAGNOSIS — N186 End stage renal disease: Secondary | ICD-10-CM | POA: Diagnosis present

## 2015-09-08 DIAGNOSIS — I160 Hypertensive urgency: Secondary | ICD-10-CM | POA: Diagnosis present

## 2015-09-08 DIAGNOSIS — N2581 Secondary hyperparathyroidism of renal origin: Secondary | ICD-10-CM | POA: Diagnosis not present

## 2015-09-08 DIAGNOSIS — R0602 Shortness of breath: Secondary | ICD-10-CM | POA: Diagnosis present

## 2015-09-08 DIAGNOSIS — Z7982 Long term (current) use of aspirin: Secondary | ICD-10-CM

## 2015-09-08 DIAGNOSIS — D631 Anemia in chronic kidney disease: Secondary | ICD-10-CM | POA: Diagnosis present

## 2015-09-08 DIAGNOSIS — E1040 Type 1 diabetes mellitus with diabetic neuropathy, unspecified: Secondary | ICD-10-CM | POA: Diagnosis not present

## 2015-09-08 DIAGNOSIS — K3184 Gastroparesis: Secondary | ICD-10-CM | POA: Diagnosis not present

## 2015-09-08 DIAGNOSIS — I120 Hypertensive chronic kidney disease with stage 5 chronic kidney disease or end stage renal disease: Principal | ICD-10-CM | POA: Diagnosis present

## 2015-09-08 DIAGNOSIS — R079 Chest pain, unspecified: Secondary | ICD-10-CM | POA: Diagnosis present

## 2015-09-08 DIAGNOSIS — Z794 Long term (current) use of insulin: Secondary | ICD-10-CM | POA: Diagnosis not present

## 2015-09-08 DIAGNOSIS — Z6833 Body mass index (BMI) 33.0-33.9, adult: Secondary | ICD-10-CM

## 2015-09-08 DIAGNOSIS — J810 Acute pulmonary edema: Secondary | ICD-10-CM

## 2015-09-08 DIAGNOSIS — Z9119 Patient's noncompliance with other medical treatment and regimen: Secondary | ICD-10-CM | POA: Diagnosis not present

## 2015-09-08 DIAGNOSIS — Z79899 Other long term (current) drug therapy: Secondary | ICD-10-CM

## 2015-09-08 DIAGNOSIS — E1121 Type 2 diabetes mellitus with diabetic nephropathy: Secondary | ICD-10-CM | POA: Diagnosis present

## 2015-09-08 DIAGNOSIS — I509 Heart failure, unspecified: Secondary | ICD-10-CM | POA: Diagnosis not present

## 2015-09-08 DIAGNOSIS — E0590 Thyrotoxicosis, unspecified without thyrotoxic crisis or storm: Secondary | ICD-10-CM | POA: Diagnosis present

## 2015-09-08 DIAGNOSIS — I2510 Atherosclerotic heart disease of native coronary artery without angina pectoris: Secondary | ICD-10-CM | POA: Diagnosis present

## 2015-09-08 DIAGNOSIS — Z992 Dependence on renal dialysis: Secondary | ICD-10-CM | POA: Diagnosis not present

## 2015-09-08 DIAGNOSIS — J189 Pneumonia, unspecified organism: Secondary | ICD-10-CM | POA: Diagnosis not present

## 2015-09-08 DIAGNOSIS — E1122 Type 2 diabetes mellitus with diabetic chronic kidney disease: Secondary | ICD-10-CM | POA: Diagnosis present

## 2015-09-08 DIAGNOSIS — R0789 Other chest pain: Secondary | ICD-10-CM | POA: Diagnosis not present

## 2015-09-08 DIAGNOSIS — J181 Lobar pneumonia, unspecified organism: Secondary | ICD-10-CM | POA: Diagnosis not present

## 2015-09-08 DIAGNOSIS — I10 Essential (primary) hypertension: Secondary | ICD-10-CM | POA: Diagnosis not present

## 2015-09-08 LAB — BASIC METABOLIC PANEL
ANION GAP: 16 — AB (ref 5–15)
BUN: 72 mg/dL — ABNORMAL HIGH (ref 6–20)
CHLORIDE: 104 mmol/L (ref 101–111)
CO2: 18 mmol/L — AB (ref 22–32)
Calcium: 8.5 mg/dL — ABNORMAL LOW (ref 8.9–10.3)
Creatinine, Ser: 13.31 mg/dL — ABNORMAL HIGH (ref 0.44–1.00)
GFR calc Af Amer: 4 mL/min — ABNORMAL LOW (ref >60)
GFR calc non Af Amer: 3 mL/min — ABNORMAL LOW (ref >60)
GLUCOSE: 134 mg/dL — AB (ref 65–99)
POTASSIUM: 4.3 mmol/L (ref 3.5–5.1)
Sodium: 138 mmol/L (ref 135–145)

## 2015-09-08 LAB — TROPONIN I: Troponin I: 0.03 ng/mL (ref <0.031)

## 2015-09-08 LAB — CBC
HEMATOCRIT: 34.1 % — AB (ref 36.0–46.0)
Hemoglobin: 11.2 g/dL — ABNORMAL LOW (ref 12.0–15.0)
MCH: 27.3 pg (ref 26.0–34.0)
MCHC: 32.8 g/dL (ref 30.0–36.0)
MCV: 83 fL (ref 78.0–100.0)
Platelets: 275 10*3/uL (ref 150–400)
RBC: 4.11 MIL/uL (ref 3.87–5.11)
RDW: 16 % — AB (ref 11.5–15.5)
WBC: 10.8 10*3/uL — ABNORMAL HIGH (ref 4.0–10.5)

## 2015-09-08 LAB — I-STAT TROPONIN, ED: Troponin i, poc: 0.02 ng/mL (ref 0.00–0.08)

## 2015-09-08 LAB — TSH: TSH: 2.385 u[IU]/mL (ref 0.350–4.500)

## 2015-09-08 LAB — GLUCOSE, CAPILLARY: GLUCOSE-CAPILLARY: 119 mg/dL — AB (ref 65–99)

## 2015-09-08 LAB — BRAIN NATRIURETIC PEPTIDE: B NATRIURETIC PEPTIDE 5: 1381.5 pg/mL — AB (ref 0.0–100.0)

## 2015-09-08 MED ORDER — CITALOPRAM HYDROBROMIDE 20 MG PO TABS
10.0000 mg | ORAL_TABLET | Freq: Every day | ORAL | Status: DC
  Administered 2015-09-08 – 2015-09-10 (×3): 10 mg via ORAL
  Filled 2015-09-08 (×4): qty 1

## 2015-09-08 MED ORDER — DEXTROSE 5 % IV SOLN
500.0000 mg | Freq: Once | INTRAVENOUS | Status: AC
  Administered 2015-09-08: 500 mg via INTRAVENOUS
  Filled 2015-09-08: qty 500

## 2015-09-08 MED ORDER — DEXTROSE 5 % IV SOLN
1.0000 g | Freq: Once | INTRAVENOUS | Status: AC
  Administered 2015-09-08: 1 g via INTRAVENOUS
  Filled 2015-09-08: qty 10

## 2015-09-08 MED ORDER — BUDESONIDE 0.5 MG/2ML IN SUSP
0.5000 mg | Freq: Two times a day (BID) | RESPIRATORY_TRACT | Status: DC
  Administered 2015-09-09 – 2015-09-10 (×2): 0.5 mg via RESPIRATORY_TRACT
  Filled 2015-09-08 (×4): qty 2

## 2015-09-08 MED ORDER — INSULIN DETEMIR 100 UNIT/ML ~~LOC~~ SOLN
22.0000 [IU] | Freq: Two times a day (BID) | SUBCUTANEOUS | Status: DC
  Administered 2015-09-08 – 2015-09-09 (×3): 22 [IU] via SUBCUTANEOUS
  Filled 2015-09-08 (×5): qty 0.22

## 2015-09-08 MED ORDER — SODIUM CHLORIDE 0.9% FLUSH
3.0000 mL | Freq: Two times a day (BID) | INTRAVENOUS | Status: DC
  Administered 2015-09-09 – 2015-09-10 (×4): 3 mL via INTRAVENOUS

## 2015-09-08 MED ORDER — HEPARIN SODIUM (PORCINE) 5000 UNIT/ML IJ SOLN
5000.0000 [IU] | Freq: Three times a day (TID) | INTRAMUSCULAR | Status: DC
  Administered 2015-09-09 – 2015-09-11 (×8): 5000 [IU] via SUBCUTANEOUS
  Filled 2015-09-08 (×8): qty 1

## 2015-09-08 MED ORDER — INSULIN ASPART 100 UNIT/ML ~~LOC~~ SOLN
0.0000 [IU] | Freq: Three times a day (TID) | SUBCUTANEOUS | Status: DC
  Administered 2015-09-09: 3 [IU] via SUBCUTANEOUS
  Administered 2015-09-10: 5 [IU] via SUBCUTANEOUS

## 2015-09-08 MED ORDER — HYDROCODONE-HOMATROPINE 5-1.5 MG/5ML PO SYRP: 5.0000 mL | ORAL_SOLUTION | Freq: Four times a day (QID) | ORAL | Status: DC | PRN

## 2015-09-08 MED ORDER — SIMVASTATIN 10 MG PO TABS
5.0000 mg | ORAL_TABLET | Freq: Every day | ORAL | Status: DC
  Administered 2015-09-08 – 2015-09-10 (×3): 5 mg via ORAL
  Filled 2015-09-08 (×4): qty 1

## 2015-09-08 MED ORDER — CALCIUM CARBONATE-VITAMIN D 500-200 MG-UNIT PO TABS
1.0000 | ORAL_TABLET | Freq: Every day | ORAL | Status: DC
  Administered 2015-09-09 – 2015-09-10 (×2): 1 via ORAL
  Filled 2015-09-08 (×2): qty 1

## 2015-09-08 MED ORDER — ASPIRIN EC 81 MG PO TBEC
81.0000 mg | DELAYED_RELEASE_TABLET | Freq: Every day | ORAL | Status: DC
  Administered 2015-09-09 – 2015-09-10 (×2): 81 mg via ORAL
  Filled 2015-09-08 (×2): qty 1

## 2015-09-08 MED ORDER — PANTOPRAZOLE SODIUM 40 MG PO TBEC
40.0000 mg | DELAYED_RELEASE_TABLET | Freq: Every day | ORAL | Status: DC
  Administered 2015-09-09 – 2015-09-10 (×2): 40 mg via ORAL
  Filled 2015-09-08 (×2): qty 1

## 2015-09-08 MED ORDER — IPRATROPIUM-ALBUTEROL 0.5-2.5 (3) MG/3ML IN SOLN
3.0000 mL | Freq: Once | RESPIRATORY_TRACT | Status: AC
  Administered 2015-09-08: 3 mL via RESPIRATORY_TRACT
  Filled 2015-09-08: qty 3

## 2015-09-08 MED ORDER — RENA-VITE PO TABS
1.0000 | ORAL_TABLET | Freq: Every day | ORAL | Status: DC
  Administered 2015-09-09 – 2015-09-10 (×2): 1 via ORAL
  Filled 2015-09-08 (×2): qty 1

## 2015-09-08 MED ORDER — GABAPENTIN 100 MG PO CAPS
200.0000 mg | ORAL_CAPSULE | Freq: Every day | ORAL | Status: DC
  Administered 2015-09-08 – 2015-09-10 (×3): 200 mg via ORAL
  Filled 2015-09-08 (×4): qty 2

## 2015-09-08 MED ORDER — SEVELAMER CARBONATE 800 MG PO TABS
1600.0000 mg | ORAL_TABLET | Freq: Three times a day (TID) | ORAL | Status: DC
  Administered 2015-09-09 – 2015-09-10 (×6): 1600 mg via ORAL
  Filled 2015-09-08 (×6): qty 2

## 2015-09-08 MED ORDER — DEXTROSE 5 % IV SOLN
1.0000 g | INTRAVENOUS | Status: DC
  Administered 2015-09-09 – 2015-09-10 (×2): 1 g via INTRAVENOUS
  Filled 2015-09-08 (×3): qty 10

## 2015-09-08 MED ORDER — SEVELAMER CARBONATE 800 MG PO TABS: 800.0000 mg | ORAL_TABLET | ORAL | Status: DC | PRN

## 2015-09-08 MED ORDER — METHIMAZOLE 5 MG PO TABS
5.0000 mg | ORAL_TABLET | Freq: Every day | ORAL | Status: DC
  Administered 2015-09-08 – 2015-09-10 (×3): 5 mg via ORAL
  Filled 2015-09-08 (×4): qty 1

## 2015-09-08 MED ORDER — HYDRALAZINE HCL 20 MG/ML IJ SOLN
10.0000 mg | Freq: Three times a day (TID) | INTRAMUSCULAR | Status: DC | PRN
  Administered 2015-09-08 – 2015-09-10 (×2): 10 mg via INTRAVENOUS
  Filled 2015-09-08 (×2): qty 1

## 2015-09-08 MED ORDER — LABETALOL HCL 300 MG PO TABS
300.0000 mg | ORAL_TABLET | Freq: Two times a day (BID) | ORAL | Status: DC
  Administered 2015-09-08 – 2015-09-10 (×4): 300 mg via ORAL
  Filled 2015-09-08 (×5): qty 1

## 2015-09-08 MED ORDER — CLONIDINE HCL 0.1 MG PO TABS
0.2000 mg | ORAL_TABLET | Freq: Once | ORAL | Status: AC
Start: 2015-09-08 — End: 2015-09-08
  Administered 2015-09-08: 0.2 mg via ORAL
  Filled 2015-09-08: qty 2

## 2015-09-08 MED ORDER — HYDROXYZINE HCL 25 MG PO TABS: 25.0000 mg | ORAL_TABLET | ORAL | Status: DC | PRN

## 2015-09-08 MED ORDER — DEXTROSE 5 % IV SOLN
500.0000 mg | INTRAVENOUS | Status: DC
  Administered 2015-09-09: 500 mg via INTRAVENOUS
  Filled 2015-09-08 (×3): qty 500

## 2015-09-08 MED ORDER — GABAPENTIN 100 MG PO CAPS
100.0000 mg | ORAL_CAPSULE | Freq: Every day | ORAL | Status: DC
  Administered 2015-09-09 – 2015-09-10 (×2): 100 mg via ORAL
  Filled 2015-09-08 (×2): qty 1

## 2015-09-08 MED ORDER — AMLODIPINE BESYLATE 10 MG PO TABS
10.0000 mg | ORAL_TABLET | Freq: Every day | ORAL | Status: DC
  Administered 2015-09-10: 10 mg via ORAL
  Filled 2015-09-08: qty 1

## 2015-09-08 MED ORDER — ALBUTEROL SULFATE (2.5 MG/3ML) 0.083% IN NEBU
3.0000 mL | INHALATION_SOLUTION | RESPIRATORY_TRACT | Status: DC | PRN
  Administered 2015-09-09 – 2015-09-10 (×2): 3 mL via RESPIRATORY_TRACT
  Filled 2015-09-08 (×2): qty 3

## 2015-09-08 MED ORDER — TOPIRAMATE 25 MG PO TABS
50.0000 mg | ORAL_TABLET | Freq: Every day | ORAL | Status: DC
  Administered 2015-09-08 – 2015-09-10 (×3): 50 mg via ORAL
  Filled 2015-09-08 (×3): qty 2

## 2015-09-08 NOTE — H&P (Addendum)
History and Physical  Linda Ellison [id number] DOB: 02/21/1976 DOA: 09/08/2015  Referring physician: EDP PCP: Marijean Bravo, MD   Chief Complaint: chest pain, sob  HPI: Linda Ellison is a 40 y.o. female h/o ESRD on HD TThS ( last on Saturday), insulin dependent dm2,  HTN,  Who presented to [facility] ED with above complaints. on presentation, she has dyspnea ,not able to finish sentences, she has to sit up on the edge of triage chair in order to breath deeply, she is also hypertensive , sbp in 190'2 to 200's. She did not have wheezing,but does report productive cough for a week, she reported fever 101 a week ago, she was seen at the time, was given steroids and cough medicine, but she continue to cough and has progressive sob, she started to have chest pain this am around 4. In the ED she was given clonidine with improvement of blood pressure, first set of troponin negative, ekg no acute changes, cxr obtained in the ED should bilateral pleural effusion vs pna.  she was given nebs, abx, nephrology called  By ED  States no need of urgent dialysis, recommended admit to [place], hospitalist called to admit the patient.  Patient does not have fever currently, she is on room air, denies n/v, no diarrhea. She does has some lower extremity edema, she reported still make some urine, reported has been on HD for the last two years. She works at [employer].   Review of Systems:  Detail per HPI, Review of systems are otherwise negative  Past Medical History  Diagnosis Date  . Asthma   . ESRD on hemodialysis ([place])     started dialysis 01/29/11  . Weight loss   . Loss of appetite   . CHF (congestive heart failure) ([place])   . Hypertension     a. Also with hx of hypotension - has been started on midodrine.  . Hyperlipidemia   . Diabetes mellitus   . Peripheral neuropathy ([place])   . Iron deficiency anemia   . Hyperparathyroidism, secondary ([place])   . Sciatica   . GERD (gastroesophageal  reflux disease)   . Adenoma   . Gastroparesis   . Esophageal erosions     a. Per EGD 10/2011.  Marland Kitchen CAD (coronary artery disease)     a. False positive stress echo 06/2012 at [facility] - cath with mild nonobstructive CAD at [facility] (mild luminal irregularities in LAD, moderate diffuse disease in distal RCA).  . History of echocardiogram     Echo 9/14: EF 55-60%, normal wall motion, normal diastolic function, PASP 21  . Chronic chest pain     a. H/o longstanding atypical CP. Nonobstructive cath 06/2012 and negative VQ.  Marland Kitchen ASCVD (arteriosclerotic cardiovascular disease)   . Anginal pain ([place])   . Peripheral vascular disease ([place])   . PONV (postoperative nausea and vomiting)   . Shortness of breath 07/12/2015  . Depression   . HCAP (healthcare-associated pneumonia) 07/12/2015   Past Surgical History  Procedure Laterality Date  . Cesarean section    . Cholecystectomy  1996  . Av fistula placement  08/2010    Left radiocephalic AVF  . Ligation goretex fistula  01/04/11    Left AVF  . Colonoscopy    . Esophagogastroduodenoscopy  10/19/2011    Dr. Silvano Rusk  . Foot surgery Left   . Tubal ligation    . Left heart catheterization with coronary angiogram N/A 06/27/2012    Procedure: LEFT HEART  CATHETERIZATION WITH CORONARY ANGIOGRAM;  Surgeon: Larey Dresser, MD;  Location: [facility] CATH LAB;  Service: Cardiovascular;  Laterality: N/A;  . Fracture surgery Left     left femur  . Dilation and curettage of uterus    . Av fistula placement Right 05/07/2014    Procedure: ARTERIOVENOUS (AV) FISTULA CREATION;  Surgeon: Elam Dutch, MD;  Location: [place];  Service: Vascular;  Laterality: Right;  . Av fistula placement Right 07/02/2014    Procedure: INSERTION OF ARTERIOVENOUS (AV) GORE-TEX GRAFT ARM;  Surgeon: Elam Dutch, MD;  Location: [facility] OR;  Service: Vascular;  Laterality: Right;  . Thrombectomy and revision of arterioventous (av) goretex  graft Right 07/16/2014    Procedure: THROMBECTOMY  Right  arm   ARTERIOVENOUS  GORETEX  GRAFT;  Surgeon: Elam Dutch, MD;  Location: [place];  Service: Vascular;  Laterality: Right;  . Peripheral vascular catheterization Left 08/08/2014    Procedure: Upper Extremity Angiography;  Surgeon: Algernon Huxley, MD;  Location: [place] CV LAB;  Service: Cardiovascular;  Laterality: Left;  . Peripheral vascular catheterization Left 08/08/2014    Procedure: Upper Extremity Intervention;  Surgeon: Algernon Huxley, MD;  Location: [place] CV LAB;  Service: Cardiovascular;  Laterality: Left;  . Ligation of arteriovenous  fistula Left 08/21/2014    Procedure: LIGATION OF ARTERIOVENOUS  FISTULA;  Surgeon: Algernon Huxley, MD;  Location: [facility] ORS;  Service: Vascular;  Laterality: Left;   Social History:  reports that she has never smoked. She has never used smokeless tobacco. She reports that she does not drink alcohol or use illicit drugs. 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 Ciprofloxacin Hives, Itching and Nausea And Vomiting  . Fluocinolone Itching, Nausea And Vomiting and Other (See Comments)  . Pineapple Itching and Other (See Comments)    Pt states that this medication makes her tongue raw.    . Strawberry Extract Itching and Other (See Comments)    Pt states that this medication makes her tongue raw.    . Tylenol [Acetaminophen] Itching  . Adhesive [Tape] Itching and Rash  . Chlorhexidine Gluconate Itching  . Clindamycin Diarrhea, Nausea And Vomiting and Rash  . Oxycodone Nausea And Vomiting, Rash and Other (See Comments)    Reaction:  Hallucinations   . Shrimp [Shellfish Allergy] Rash    Family History  Problem Relation Age of Onset  . Heart disease Mother     heart attack at 35 y/o-infected valve  . Kidney disease Mother     was a dialysis patient  . Heart disease Brother   . Kidney disease Maternal Grandmother      pre-dialysis  . Kidney failure Paternal Uncle   . Kidney failure Paternal Aunt   . Diabetes Father   . Hypertension Father   . Colon cancer Neg Hx   . Arthritis Brother   . Diabetes Maternal Aunt   . Hypertension Maternal Aunt   . Diabetes Paternal Aunt   . Heart disease Paternal Aunt   . Hypertension Paternal Aunt       Prior to Admission medications   Medication Sig Start Date End Date Taking? Authorizing Provider  acetaminophen-codeine (TYLENOL #3) 300-30 MG tablet Take 1 tablet by mouth every 8 (eight) hours as needed for moderate pain or severe pain.  08/26/15  Yes Historical Provider, MD  albuterol (PROVENTIL HFA;VENTOLIN HFA)  108 (90 Base) MCG/ACT inhaler Inhale 2 puffs into the lungs every 4 (four) hours as needed for wheezing or shortness of breath. 09/01/15  Yes Orpah Greek, MD  amLODipine (NORVASC) 10 MG tablet Take 10 mg by mouth daily. 06/13/14  Yes Historical Provider, MD  aspirin EC 81 MG tablet Take 81 mg by mouth daily.   Yes Historical Provider, MD  Calcium Carbonate-Vitamin D (CALCIUM 600+D) 600-400 MG-UNIT tablet Take 1 tablet by mouth daily.   Yes Historical Provider, MD  citalopram (CELEXA) 10 MG tablet Take 10 mg by mouth at bedtime.    Yes Historical Provider, MD  fluticasone (FLOVENT HFA) 110 MCG/ACT inhaler Inhale 2 puffs into the lungs 2 (two) times daily. 03/26/15  Yes Rolland Porter, MD  gabapentin (NEURONTIN) 100 MG capsule Take 100-200 mg by mouth 2 (two) times daily. Pt takes one capsule in the morning and two at bedtime.   Yes Historical Provider, MD  HYDROcodone-homatropine (HYCODAN) 5-1.5 MG/5ML syrup Take 5 mLs by mouth every 6 (six) hours as needed for cough. 09/01/15  Yes Orpah Greek, MD  hydrOXYzine (ATARAX/VISTARIL) 25 MG tablet Take 25 mg by mouth every 4 (four) hours as needed for itching.    Yes Historical Provider, MD  Insulin Detemir (LEVEMIR FLEXTOUCH) 100 UNIT/ML Pen Inject 22 Units into the skin every 12 (twelve) hours.    Yes  Historical Provider, MD  labetalol (NORMODYNE) 300 MG tablet Take 300 mg by mouth 2 (two) times daily. 06/17/15  Yes Historical Provider, MD  linagliptin (TRADJENTA) 5 MG TABS tablet Take 5 mg by mouth at bedtime.   Yes Historical Provider, MD  methimazole (TAPAZOLE) 5 MG tablet Take 5 mg by mouth at bedtime.    Yes Historical Provider, MD  multivitamin (RENA-VIT) TABS tablet Take 1 tablet by mouth daily.   Yes Historical Provider, MD  omeprazole (PRILOSEC) 20 MG capsule Take 20 mg by mouth daily.   Yes Historical Provider, MD  predniSONE (STERAPRED UNI-PAK 21 TAB) 5 MG (21) TBPK tablet Take 5 mg by mouth See admin instructions. Taper dose 09/05/15  Yes Historical Provider, MD  sevelamer carbonate (RENVELA) 800 MG tablet Take 800-1,600 mg by mouth 5 (five) times daily. Pt takes two tablets three times daily with meals and one tablet two times daily with snacks.   Yes Historical Provider, MD  simvastatin (ZOCOR) 5 MG tablet Take 5 mg by mouth at bedtime.   Yes Historical Provider, MD  topiramate (TOPAMAX) 50 MG tablet Take 50 mg by mouth at bedtime.    Yes Historical Provider, MD  amLODipine (NORVASC) 5 MG tablet Take 1 tablet (5 mg total) by mouth at bedtime. Patient not taking: Reported on 09/08/2015 07/14/15   Geradine Girt, DO  HYDROcodone-acetaminophen (NORCO) 5-325 MG tablet Take 1 tablet by mouth every 6 (six) hours as needed for moderate pain. Patient not taking: Reported on 09/08/2015 07/14/15   Geradine Girt, DO  labetalol (NORMODYNE) 200 MG tablet Take 1 tablet (200 mg total) by mouth every Monday, Wednesday, and Friday at 8 PM. Patient not taking: Reported on 09/08/2015 07/14/15   Tomi Bamberger Vann, DO  ondansetron (ZOFRAN ODT) 4 MG disintegrating tablet Take 1 tablet (4 mg total) by mouth every 8 (eight) hours as needed for nausea or vomiting. Patient not taking: Reported on 09/08/2015 06/17/15   Merryl Hacker, MD  ondansetron (ZOFRAN) 4 MG tablet Take 1 tablet (4 mg total) by mouth every 6 (six)  hours. Patient not taking: Reported  on 09/08/2015 08/08/15   Virgel Manifold, MD  predniSONE (DELTASONE) 20 MG tablet Take 2 tablets (40 mg total) by mouth daily with breakfast. Patient not taking: Reported on 09/08/2015 09/01/15   Orpah Greek, MD    Physical Exam: BP 187/112 mmHg  Pulse 90  Temp(Src) 98 F (36.7 C) (Oral)  Resp 22  SpO2 96%  LMP 08/13/2015  General:  Not comfortable, not in respiratory distress, on room air Eyes: PERRL ENT: unremarkable Neck: supple, no JVD Cardiovascular: RRR Respiratory: mild bilateral lower field crackles, no wheezing, no rhonchi Abdomen: soft/ND/ND, positive bowel sounds Skin: no rash Musculoskeletal:  Mild pitting edema Psychiatric: calm/cooperative Neurologic: no focal findings            Labs on Admission:  Basic Metabolic Panel:  Recent Labs Lab 09/08/15 1240  NA 138  K 4.3  CL 104  CO2 18*  GLUCOSE 134*  BUN 72*  CREATININE 13.31*  CALCIUM 8.5*   Liver Function Tests: No results for input(s): AST, ALT, ALKPHOS, BILITOT, PROT, ALBUMIN in the last 168 hours. No results for input(s): LIPASE, AMYLASE in the last 168 hours. No results for input(s): AMMONIA in the last 168 hours. CBC:  Recent Labs Lab 09/08/15 1240  WBC 10.8*  HGB 11.2*  HCT 34.1*  MCV 83.0  PLT 275   Cardiac Enzymes: No results for input(s): CKTOTAL, CKMB, CKMBINDEX, TROPONINI in the last 168 hours.  BNP (last 3 results)  Recent Labs  09/08/15 1240  BNP 1381.5*    ProBNP (last 3 results) No results for input(s): PROBNP in the last 8760 hours.  CBG: No results for input(s): GLUCAP in the last 168 hours.  Radiological Exams on Admission: Dg Chest 2 View  09/08/2015  CLINICAL DATA:  Cough, shortness of breath and chest tightness beginning yesterday. EXAM: CHEST  2 VIEW COMPARISON:  PA and lateral chest 09/01/2015, 07/13/2015 and 02/24/2015. CT chest 07/12/2015. FINDINGS: The patient has new small bilateral pleural effusions. Bilateral  airspace disease appearing worst the bases has increased since the most recent examination. No pneumothorax is identified. Heart size is mildly enlarged. IMPRESSION: Increased bilateral airspace disease appearing worst in the bases with small effusions could be due to pulmonary edema and/or pneumonia. Electronically Signed   By: Inge Rise M.D.   On: 09/08/2015 13:15    EKG: Independently reviewed. Sinus rhythm, t waves flattening, inversion v4-v6, QTc 475  Assessment/Plan Present on Admission:  . PNA (pneumonia)  Chest pain:  last cath in 2014 No obstructive CAD, with h/o of chronic chest pain listed on chart. no acute EKG changes, first set of troponin negative,on room air, not in respiratory distress,  cxr with bilateral pleural effusion vs pna.  Patient is hypertensive, chest pain could also from htn.  Will admit to med tele, continue cycle troponin, echo pending, iv abx, volume by HD.  Check urine legionella and streppneumio antigen, continue rocephin/zithromax that was started in the ED.  HTN; uncontrolled, she received clonidine in the ED continue home meds norvasc, labetalol, add prn hydralazine, expect bp will be better controlled with dialysis.   ESRD on HD TThS, nephrology notified by EDP.  Insulin dependent DM2:check a1c, continue home dose insulin, plus ssi  H/o hyperthyoidism? She is on tapazole at home which is continued, will check tsh.    DVT prophylaxis: heparin  Consultants: nephrology, ED called Dr Joelyn Oms who recommended patient being admitted to [place], HD on 6/6  Code Status: full   Family Communication:  Patient  Disposition Plan: admit from [facility] ED to  [place], med tele  Time spent: 97mins  Roderick Calo MD, PhD [facility] Pager [phone] If 7PM-7AM, please contact night-coverage at [email or web address], password [facility]

## 2015-09-08 NOTE — Progress Notes (Signed)
Patient noted to have bees seen in the ED 9 times with 2 admissions within the last six months.  EDCM went to speak to patient at bedside, however, patient asleep.  EDCM did not disturb patient at this time.  Per chart review, patient receives her dialysis T-Th-Sat at [facility].  Patient having issues with keeping dialysis appointments due to transportation issues. Unsure of compliance issues at present. Pcp listed as Dr. Jobe Igo.  Patient to be admitted to hospital for pneumonia.

## 2015-09-08 NOTE — ED Notes (Signed)
Overdue meds not given due to pending verification from [facility]

## 2015-09-08 NOTE — ED Notes (Signed)
Hospitalist at bedside 

## 2015-09-08 NOTE — ED Notes (Signed)
Pt reports NO change in breathing; lungs sounds unchanged from previous assessment. Pfeiffer at bedside and aware.

## 2015-09-08 NOTE — ED Notes (Signed)
Patient transported to X-ray 

## 2015-09-08 NOTE — ED Provider Notes (Signed)
CSN: [id number]     Arrival date & time 09/08/15  1141 History   First MD Initiated Contact with Patient 09/08/15 1237     Chief Complaint  Patient presents with  . Shortness of Breath   (Consider location/radiation/quality/duration/timing/severity/associated sxs/prior Treatment) HPI 40 y.o. female with a hx of ESRD on Dialysis, CHF, DM, HLD, presents to the Emergency Department today complaining of SOB and CP since this AM around 0430. Of note, pt has been having SOB with chest pain x 2-3 weeks, but usually takes inhaler with relief. Noted no relief this morning and decided to come to the ED for evaluation. States pain is central at 7/10 and feels like pressure. States that she had to prop herself up with 4 pillows for relief. Pt has Dialysis on Saturday and is scheduled for Tuesday. Of note, pt has had cough x 1-2 weeks with production. No fevers. Diagnosed with Bronchitis. No ABD pain. Notes N/V this after using inhaler. No headaches. No dizziness. No diaphoresis. No fevers. No hx ACS/PE. No other symptoms noted.    Past Medical History  Diagnosis Date  . Asthma   . ESRD on hemodialysis ([place])     started dialysis 01/29/11  . Weight loss   . Loss of appetite   . CHF (congestive heart failure) ([place])   . Hypertension     a. Also with hx of hypotension - has been started on midodrine.  . Hyperlipidemia   . Diabetes mellitus   . Peripheral neuropathy ([place])   . Iron deficiency anemia   . Hyperparathyroidism, secondary ([place])   . Sciatica   . GERD (gastroesophageal reflux disease)   . Adenoma   . Gastroparesis   . Esophageal erosions     a. Per EGD 10/2011.  Marland Kitchen CAD (coronary artery disease)     a. False positive stress echo 06/2012 at [facility] - cath with mild nonobstructive CAD at [facility] (mild luminal irregularities in LAD, moderate diffuse disease in distal RCA).  . History of echocardiogram     Echo 9/14: EF 55-60%, normal wall motion, normal diastolic function, PASP 21  . Chronic chest  pain     a. H/o longstanding atypical CP. Nonobstructive cath 06/2012 and negative VQ.  Marland Kitchen ASCVD (arteriosclerotic cardiovascular disease)   . Anginal pain ([place])   . Peripheral vascular disease ([place])   . PONV (postoperative nausea and vomiting)   . Shortness of breath 07/12/2015  . Depression   . HCAP (healthcare-associated pneumonia) 07/12/2015   Past Surgical History  Procedure Laterality Date  . Cesarean section    . Cholecystectomy  1996  . Av fistula placement  08/2010    Left radiocephalic AVF  . Ligation goretex fistula  01/04/11    Left AVF  . Colonoscopy    . Esophagogastroduodenoscopy  10/19/2011    Dr. Silvano Rusk  . Foot surgery Left   . Tubal ligation    . Left heart catheterization with coronary angiogram N/A 06/27/2012    Procedure: LEFT HEART CATHETERIZATION WITH CORONARY ANGIOGRAM;  Surgeon: Larey Dresser, MD;  Location: [facility] CATH LAB;  Service: Cardiovascular;  Laterality: N/A;  . Fracture surgery Left     left femur  . Dilation and curettage of uterus    . Av fistula placement Right 05/07/2014    Procedure: ARTERIOVENOUS (AV) FISTULA CREATION;  Surgeon: Elam Dutch, MD;  Location: [place];  Service: Vascular;  Laterality: Right;  . Av fistula placement Right 07/02/2014    Procedure: INSERTION OF  ARTERIOVENOUS (AV) GORE-TEX GRAFT ARM;  Surgeon: Elam Dutch, MD;  Location[facility] OR;  Service: Vascular;  Laterality: Right;  . Thrombectomy and revision of arterioventous (av) goretex  graft Right 07/16/2014    Procedure: THROMBECTOMY  Right  arm  ARTERIOVENOUS  GORETEX  GRAFT;  Surgeon: Elam Dutch, MD;  Location: [place];  Service: Vascular;  Laterality: Right;  . Peripheral vascular catheterization Left 08/08/2014    Procedure: Upper Extremity Angiography;  Surgeon: Algernon Huxley, MD;  Location: [place] CV LAB;  Service: Cardiovascular;  Laterality: Left;  . Peripheral vascular catheterization Left 08/08/2014    Procedure: Upper Extremity Intervention;  Surgeon: Algernon Huxley, MD;  Location: [place] CV LAB;  Service: Cardiovascular;  Laterality: Left;  . Ligation of arteriovenous  fistula Left 08/21/2014    Procedure: LIGATION OF ARTERIOVENOUS  FISTULA;  Surgeon: Algernon Huxley, MD;  Location: [facility] ORS;  Service: Vascular;  Laterality: Left;   Family History  Problem Relation Age of Onset  . Heart disease Mother     heart attack at 2 y/o-infected valve  . Kidney disease Mother     was a dialysis patient  . Heart disease Brother   . Kidney disease Maternal Grandmother     pre-dialysis  . Kidney failure Paternal Uncle   . Kidney failure Paternal Aunt   . Diabetes Father   . Hypertension Father   . Colon cancer Neg Hx   . Arthritis Brother   . Diabetes Maternal Aunt   . Hypertension Maternal Aunt   . Diabetes Paternal Aunt   . Heart disease Paternal Aunt   . Hypertension Paternal Aunt    Social History  Substance Use Topics  . Smoking status: Never Smoker   . Smokeless tobacco: Never Used  . Alcohol Use: No   OB History    No data available     Review of Systems ROS reviewed and all are negative for acute change except as noted in the HPI.  Allergies  Ciprofloxacin; Fluocinolone; Pineapple; Strawberry extract; Tylenol; Adhesive; Chlorhexidine gluconate; Clindamycin; Oxycodone; and Shrimp  Home Medications   Prior to Admission medications   Medication Sig Start Date End Date Taking? Authorizing Provider  acetaminophen-codeine (TYLENOL #3) 300-30 MG tablet Take 1 tablet by mouth every 8 (eight) hours as needed for moderate pain or severe pain.  08/26/15  Yes Historical Provider, MD  albuterol (PROVENTIL HFA;VENTOLIN HFA) 108 (90 Base) MCG/ACT inhaler Inhale 2 puffs into the lungs every 4 (four) hours as needed for wheezing or shortness of breath. 09/01/15  Yes Orpah Greek, MD  amLODipine (NORVASC) 10 MG tablet Take 10 mg by mouth daily. 06/13/14  Yes Historical Provider, MD  aspirin EC 81 MG tablet Take 81 mg by mouth daily.    Yes Historical Provider, MD  Calcium Carbonate-Vitamin D (CALCIUM 600+D) 600-400 MG-UNIT tablet Take 1 tablet by mouth daily.   Yes Historical Provider, MD  citalopram (CELEXA) 10 MG tablet Take 10 mg by mouth at bedtime.    Yes Historical Provider, MD  fluticasone (FLOVENT HFA) 110 MCG/ACT inhaler Inhale 2 puffs into the lungs 2 (two) times daily. 03/26/15  Yes Rolland Porter, MD  gabapentin (NEURONTIN) 100 MG capsule Take 100-200 mg by mouth 2 (two) times daily. Pt takes one capsule in the morning and two at bedtime.   Yes Historical Provider, MD  HYDROcodone-homatropine (HYCODAN) 5-1.5 MG/5ML syrup Take 5 mLs by mouth every 6 (six) hours as needed for cough. 09/01/15  Yes Orpah Greek, MD  hydrOXYzine (ATARAX/VISTARIL) 25 MG tablet Take 25 mg by mouth every 4 (four) hours as needed for itching.    Yes Historical Provider, MD  Insulin Detemir (LEVEMIR FLEXTOUCH) 100 UNIT/ML Pen Inject 22 Units into the skin every 12 (twelve) hours.    Yes Historical Provider, MD  labetalol (NORMODYNE) 300 MG tablet Take 300 mg by mouth 2 (two) times daily. 06/17/15  Yes Historical Provider, MD  linagliptin (TRADJENTA) 5 MG TABS tablet Take 5 mg by mouth at bedtime.   Yes Historical Provider, MD  methimazole (TAPAZOLE) 5 MG tablet Take 5 mg by mouth at bedtime.    Yes Historical Provider, MD  multivitamin (RENA-VIT) TABS tablet Take 1 tablet by mouth daily.   Yes Historical Provider, MD  omeprazole (PRILOSEC) 20 MG capsule Take 20 mg by mouth daily.   Yes Historical Provider, MD  predniSONE (STERAPRED UNI-PAK 21 TAB) 5 MG (21) TBPK tablet Take 5 mg by mouth See admin instructions. Taper dose 09/05/15  Yes Historical Provider, MD  sevelamer carbonate (RENVELA) 800 MG tablet Take 800-1,600 mg by mouth 5 (five) times daily. Pt takes two tablets three times daily with meals and one tablet two times daily with snacks.   Yes Historical Provider, MD  simvastatin (ZOCOR) 5 MG tablet Take 5 mg by mouth at bedtime.   Yes  Historical Provider, MD  topiramate (TOPAMAX) 50 MG tablet Take 50 mg by mouth at bedtime.    Yes Historical Provider, MD  amLODipine (NORVASC) 5 MG tablet Take 1 tablet (5 mg total) by mouth at bedtime. Patient not taking: Reported on 09/08/2015 07/14/15   Geradine Girt, DO  HYDROcodone-acetaminophen (NORCO) 5-325 MG tablet Take 1 tablet by mouth every 6 (six) hours as needed for moderate pain. Patient not taking: Reported on 09/08/2015 07/14/15   Geradine Girt, DO  labetalol (NORMODYNE) 200 MG tablet Take 1 tablet (200 mg total) by mouth every Monday, Wednesday, and Friday at 8 PM. Patient not taking: Reported on 09/08/2015 07/14/15   Tomi Bamberger Vann, DO  ondansetron (ZOFRAN ODT) 4 MG disintegrating tablet Take 1 tablet (4 mg total) by mouth every 8 (eight) hours as needed for nausea or vomiting. Patient not taking: Reported on 09/08/2015 06/17/15   Merryl Hacker, MD  ondansetron (ZOFRAN) 4 MG tablet Take 1 tablet (4 mg total) by mouth every 6 (six) hours. Patient not taking: Reported on 09/08/2015 08/08/15   Virgel Manifold, MD  predniSONE (DELTASONE) 20 MG tablet Take 2 tablets (40 mg total) by mouth daily with breakfast. Patient not taking: Reported on 09/08/2015 09/01/15   Orpah Greek, MD   BP 206/121 mmHg  Pulse 91  Temp(Src) 98 F (36.7 C) (Oral)  Resp 20  SpO2 89%  LMP 08/13/2015   Physical Exam  Constitutional: She is oriented to person, place, and time. She appears well-developed and well-nourished.  HENT:  Head: Normocephalic and atraumatic.  Eyes: EOM are normal. Pupils are equal, round, and reactive to light.  Neck: Normal range of motion. Neck supple. No tracheal deviation present.  Cardiovascular: Normal rate, regular rhythm, normal heart sounds, intact distal pulses and normal pulses.   No murmur heard. Fistula noted on left arm. Auscultated good thrill.   Pulmonary/Chest: Effort normal. No respiratory distress. She has no wheezes. She has rales in the right lower field and  the left lower field. She exhibits no tenderness.  Noted rib pain on palpation of bilateral posterior ribs around ribs 7-8.  No deformities noted. No flail. No ecchymosis.   Abdominal: Soft. There is no tenderness.  Musculoskeletal: Normal range of motion.  Neurological: She is alert and oriented to person, place, and time.  Skin: Skin is warm and dry.  Psychiatric: She has a normal mood and affect. Her behavior is normal. Thought content normal.  Nursing note and vitals reviewed.  ED Course  Procedures (including critical care time) Labs Review Labs Reviewed  CBC - Abnormal; Notable for the following:    WBC 10.8 (*)    Hemoglobin 11.2 (*)    HCT 34.1 (*)    RDW 16.0 (*)    All other components within normal limits  BASIC METABOLIC PANEL - Abnormal; Notable for the following:    CO2 18 (*)    Glucose, Bld 134 (*)    BUN 72 (*)    Creatinine, Ser 13.31 (*)    Calcium 8.5 (*)    GFR calc non Af Amer 3 (*)    GFR calc Af Amer 4 (*)    Anion gap 16 (*)    All other components within normal limits  BRAIN NATRIURETIC PEPTIDE - Abnormal; Notable for the following:    B Natriuretic Peptide 1381.5 (*)    All other components within normal limits  I-STAT TROPOININ, ED   Imaging Review Dg Chest 2 View  09/08/2015  CLINICAL DATA:  Cough, shortness of breath and chest tightness beginning yesterday. EXAM: CHEST  2 VIEW COMPARISON:  PA and lateral chest 09/01/2015, 07/13/2015 and 02/24/2015. CT chest 07/12/2015. FINDINGS: The patient has new small bilateral pleural effusions. Bilateral airspace disease appearing worst the bases has increased since the most recent examination. No pneumothorax is identified. Heart size is mildly enlarged. IMPRESSION: Increased bilateral airspace disease appearing worst in the bases with small effusions could be due to pulmonary edema and/or pneumonia. Electronically Signed   By: Inge Rise M.D.   On: 09/08/2015 13:15   I have personally reviewed and  evaluated these images and lab results as part of my medical decision-making.   EKG Interpretation   Date/Time:  Monday September 08 2015 12:40:38 EDT Ventricular Rate:  88 PR Interval:  135 QRS Duration: 76 QT Interval:  393 QTC Calculation: 475 R Axis:   29 Text Interpretation:  Sinus rhythm Borderline T wave abnormalities  Baseline wander in lead(s) V3 agree. no STEMI. no significant change from  previous. Confirmed by Johnney Killian, MD, Jeannie Done [phone]) on 09/08/2015 2:11:33 PM      MDM  I have reviewed and evaluated the relevant laboratory values I have reviewed and evaluated the relevant imaging studies.  I have interpreted the relevant EKG. I have reviewed the relevant previous healthcare records. I obtained HPI from historian. Patient discussed with supervising physician  ED Course:  Assessment: Pt is a 36yF with hx ESRD on Dialysis, CHF, DM, HLD who presents with worsening CP/SOB x 2-3 weeks that worsened this morning.. On exam, pt in NAD. Nontoxic/nonseptic appearing. VS. Does show hypertension that appears baseline for patient. Afebrile. Lungs crackles on bilateral bases. No wheezes. Heart RRR. Abdomen nontender soft. iStat Trop negative. CBC with WBC 10.8. CXR with possible pulmonary edema vs. PNA. Will treat for CAP with Azithro/Rocephin. BNP 1381.5 EKG NSR with no acute abnormalities. Given clonidine in ED for BP. Seen on 09-01-15 for similar occurrence. Considerable pulmonary edema noted on CXR. Pt with no Hx ACS/PE. Plan is to Admit to medicine. Consulted Nephrology. Requested Transfer to [facility] for Dialysis tomorrow.  Disposition/Plan:  Admit Pt acknowledges and agrees with plan  Supervising Physician Charlesetta Shanks, MD   Final diagnoses:  Community acquired pneumonia  Acute pulmonary edema [facility])    Shary Decamp, PA-C 09/08/15 1527  Charlesetta Shanks, MD 09/21/15 [phone]

## 2015-09-08 NOTE — ED Notes (Signed)
Pt is a dialysis pt with c/o SOB.  Pt states that she is asthmatic.  No audible wheezing.  Unable to finish sentences without catching her breath.  Pt sitting on the edge of triage chair in order to breathe deeply.  Last dialyzed Saturday.

## 2015-09-09 ENCOUNTER — Inpatient Hospital Stay (HOSPITAL_COMMUNITY): Payer: Medicare Other

## 2015-09-09 LAB — RENAL FUNCTION PANEL
ALBUMIN: 2.9 g/dL — AB (ref 3.5–5.0)
Anion gap: 14 (ref 5–15)
BUN: 75 mg/dL — AB (ref 6–20)
CHLORIDE: 104 mmol/L (ref 101–111)
CO2: 20 mmol/L — ABNORMAL LOW (ref 22–32)
CREATININE: 14.11 mg/dL — AB (ref 0.44–1.00)
Calcium: 8.6 mg/dL — ABNORMAL LOW (ref 8.9–10.3)
GFR calc Af Amer: 3 mL/min — ABNORMAL LOW (ref >60)
GFR, EST NON AFRICAN AMERICAN: 3 mL/min — AB (ref >60)
GLUCOSE: 33 mg/dL — AB (ref 65–99)
PHOSPHORUS: 7.1 mg/dL — AB (ref 2.5–4.6)
POTASSIUM: 3.6 mmol/L (ref 3.5–5.1)
Sodium: 138 mmol/L (ref 135–145)

## 2015-09-09 LAB — CBC
HCT: 36 % (ref 36.0–46.0)
HEMOGLOBIN: 11.6 g/dL — AB (ref 12.0–15.0)
MCH: 26.5 pg (ref 26.0–34.0)
MCHC: 32.2 g/dL (ref 30.0–36.0)
MCV: 82.2 fL (ref 78.0–100.0)
Platelets: 233 10*3/uL (ref 150–400)
RBC: 4.38 MIL/uL (ref 3.87–5.11)
RDW: 15.8 % — ABNORMAL HIGH (ref 11.5–15.5)
WBC: 8.8 10*3/uL (ref 4.0–10.5)

## 2015-09-09 LAB — GLUCOSE, CAPILLARY
GLUCOSE-CAPILLARY: 71 mg/dL (ref 65–99)
GLUCOSE-CAPILLARY: 159 mg/dL — AB (ref 65–99)
Glucose-Capillary: 105 mg/dL — ABNORMAL HIGH (ref 65–99)
Glucose-Capillary: 111 mg/dL — ABNORMAL HIGH (ref 65–99)

## 2015-09-09 LAB — CREATININE, SERUM
Creatinine, Ser: 13.79 mg/dL — ABNORMAL HIGH (ref 0.44–1.00)
GFR calc Af Amer: 3 mL/min — ABNORMAL LOW (ref >60)
GFR calc non Af Amer: 3 mL/min — ABNORMAL LOW (ref >60)

## 2015-09-09 LAB — MRSA PCR SCREENING: MRSA BY PCR: NEGATIVE

## 2015-09-09 LAB — STREP PNEUMONIAE URINARY ANTIGEN: STREP PNEUMO URINARY ANTIGEN: NEGATIVE

## 2015-09-09 MED ORDER — ALTEPLASE 2 MG IJ SOLR: 2.0000 mg | Freq: Once | INTRAMUSCULAR | Status: DC | PRN

## 2015-09-09 MED ORDER — HEPARIN SODIUM (PORCINE) 1000 UNIT/ML DIALYSIS: 100.0000 [IU]/kg | INTRAMUSCULAR | Status: DC | PRN

## 2015-09-09 MED ORDER — MORPHINE SULFATE (PF) 2 MG/ML IV SOLN
1.0000 mg | INTRAVENOUS | Status: DC | PRN
  Administered 2015-09-09: 1 mg via INTRAVENOUS
  Administered 2015-09-09 – 2015-09-10 (×4): 2 mg via INTRAVENOUS
  Administered 2015-09-11 (×2): 1 mg via INTRAVENOUS
  Filled 2015-09-09 (×4): qty 1

## 2015-09-09 MED ORDER — HYDROCODONE-ACETAMINOPHEN 5-325 MG PO TABS
1.0000 | ORAL_TABLET | Freq: Four times a day (QID) | ORAL | Status: AC | PRN
  Administered 2015-09-09 (×2): 1 via ORAL
  Filled 2015-09-09 (×2): qty 1

## 2015-09-09 MED ORDER — SODIUM CHLORIDE 0.9 % IV SOLN: 100.0000 mL | INTRAVENOUS | Status: DC | PRN

## 2015-09-09 MED ORDER — LIDOCAINE HCL (PF) 1 % IJ SOLN: 5.0000 mL | INTRAMUSCULAR | Status: DC | PRN

## 2015-09-09 MED ORDER — LOPERAMIDE HCL 2 MG PO CAPS
2.0000 mg | ORAL_CAPSULE | Freq: Once | ORAL | Status: AC
  Administered 2015-09-09: 2 mg via ORAL
  Filled 2015-09-09: qty 1

## 2015-09-09 MED ORDER — LIDOCAINE-PRILOCAINE 2.5-2.5 % EX CREA: 1.0000 "application " | TOPICAL_CREAM | CUTANEOUS | Status: DC | PRN

## 2015-09-09 MED ORDER — MORPHINE SULFATE (PF) 2 MG/ML IV SOLN
INTRAVENOUS | Status: AC
  Filled 2015-09-09: qty 1

## 2015-09-09 MED ORDER — HEPARIN SODIUM (PORCINE) 1000 UNIT/ML DIALYSIS: 1000.0000 [IU] | INTRAMUSCULAR | Status: DC | PRN

## 2015-09-09 MED ORDER — PENTAFLUOROPROP-TETRAFLUOROETH EX AERO: 1.0000 "application " | INHALATION_SPRAY | CUTANEOUS | Status: DC | PRN

## 2015-09-09 NOTE — Progress Notes (Addendum)
Patient ID: Linda Ellison, female   DOB: 29-Jul-1975, 40 y.o.   MRN: [id number]   PROGRESS NOTE    Linda Ellison  [id number] DOB: [birth year] DOA: 09/08/2015  PCP: Marijean Bravo, MD   Brief Narrative:  40 year old female with known ESRD (non compliant with HD sessions), HTN, presented to [facility] ED with main concern of progressive dyspnea. In ED, SBP was > 200 and CXR was notable for pulmonary vascular congestion .   Assessment & Plan:   Pulmonary vascular congestion secondary to ESRD - plan for HD today, appreciate nephrology team assistance - renal panel in AM - attempted to discuss compliance but pt says she is too tired   ? 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 Hyperthyroidism - on Methimazole - TSH is WNL   Obesity  - Body mass index is 33.68 kg/(m^2).  DVT prophylaxis: Heparin SQ Code Status: Full Family Communication: Patient at bedside, pt declined my offer to update family  Disposition Plan: home once cleared by nephrology team   Consultants:   Nephrology   Procedures:   None  Antimicrobials:   Rocephin 6/5 -->  Zithromax 6/5 -->  Subjective: Sleepy, denies chest pain or shortness of breath this AM.   Objective: Filed Vitals:   09/08/15 2200 09/08/15 2308 09/09/15 0038 09/09/15 0603  BP: 164/105  189/106  160/101  Pulse: 90 91  91  Temp:  98.3 F (36.8 C)  98.3 F (36.8 C)  TempSrc:  Oral  Oral  Resp:  18    Weight:  94.62 kg (208 lb 9.6 oz)    SpO2: 97% 100% 100% 96%   No intake or output data in the 24 hours ending 09/09/15 0634 Filed Weights   09/08/15 2308  Weight: 94.62 kg (208 lb 9.6 oz)    Examination:  General exam: Appears comfortable, NAD Respiratory system: Bibasilar crackles with diminished breath sounds at bases and some dullness to percussion at bases Cardiovascular system: S1 & S2 heard, RRR. No JVD, murmurs, rubs, gallops or clicks. No pedal edema. Gastrointestinal system: Abdomen is nondistended, soft and nontender.  Extremities: Symmetric 5 x 5 power.  Data Reviewed: I have personally reviewed following labs and imaging studies  CBC:  Recent Labs Lab 09/08/15 1240 09/09/15 0030  WBC 10.8* 8.8  HGB 11.2* 11.6*  HCT 34.1* 36.0  MCV 83.0 82.2  PLT 275 0000000   Basic Metabolic Panel:  Recent Labs Lab 09/08/15 1240 09/09/15 0030  NA 138  --   K 4.3  --   CL 104  --   CO2 18*  --   GLUCOSE 134*  --   BUN 72*  --   CREATININE 13.31* 13.79*  CALCIUM 8.5*  --    Cardiac Enzymes:  Recent  Labs Lab 09/08/15 1653  TROPONINI 0.03   CBG:  Recent Labs Lab 09/08/15 2302 09/09/15 0629  GLUCAP 119* 71   Thyroid Function Tests:  Recent Labs  09/08/15 1653  TSH 2.385    Recent Results (from the past 240 hour(s))  MRSA PCR Screening     Status: None   Collection Time: 09/09/15 12:41 AM  Result Value Ref Range Status   MRSA by PCR NEGATIVE NEGATIVE Final    Radiology Studies: Dg Chest 2 View 09/08/2015   Increased bilateral airspace disease appearing worst in the bases with small effusions could be due to pulmonary edema and/or pneumonia.   Scheduled Meds: . amLODipine  10 mg Oral Daily  . aspirin EC  81 mg Oral Daily  . azithromycin  500 mg Intravenous Q24H  . budesonide  0.5 mg Inhalation BID  . calcium-vitamin D  1 tablet  Oral Daily  . cefTRIAXone (ROCEPHIN)  IV  1 g Intravenous Q24H  . citalopram  10 mg Oral QHS  . gabapentin  100 mg Oral Daily  . gabapentin  200 mg Oral QHS  . heparin  5,000 Units Subcutaneous Q8H  . insulin aspart  0-15 Units Subcutaneous TID WC  . insulin detemir  22 Units Subcutaneous Q12H  . labetalol  300 mg Oral BID  . methimazole  5 mg Oral QHS  . multivitamin  1 tablet Oral Daily  . pantoprazole  40 mg Oral Daily  . sevelamer carbonate  1,600 mg Oral TID WC  . simvastatin  5 mg Oral QHS  . sodium chloride flush  3 mL Intravenous Q12H  . topiramate  50 mg Oral QHS   Continuous Infusions:    LOS: 1 day    Time spent: 20 minutes    Faye Ramsay, MD [facility] Pager [phone]  If 7PM-7AM, please contact night-coverage [email or web address] Password [email or web address] 09/09/2015, 6:34 AM

## 2015-09-09 NOTE — Consult Note (Signed)
Linda Ellison 09/09/2015 Linda Ellison Requesting Physician:  [name] MD  Reason for Consult:  SOB, HTN,ESRD HPI:  40 year old female seen at the request of Dr. [name] for the evaluation of acute dyspnea, hypertensive urgency, and an ESRD patient. Patient's pertinent history includes ESRD on hemodialysis at the [facility] form kidney center, hypertension, atherosclerotic disease. She presented to the emergency room yesterday with 2 weeks of progressive dyspnea with components of orthopnea. Blood pressure was elevated with systolics greater than [id number]. Two-view chest x-ray demonstrated increased basilar opacities with pleural effusions bilaterally and pulmonary edema. Hemoglobin was 11.2. Cardiac enzymes have been negative.  Labs demonstrated serum potassium of 4.3, BUN 72. Overnight she has improved and is now lying flat in the bed on room air, complaining of no dyspnea. She has been restarted on home blood pressure medications.  She is quite nonadherent with dialysis, attending approximate 50% of her scheduled treatments and signing off of all of those early. She states this is because of child care issues and her general dislike of dialysis. She tends to leave 1-2 kg above her estimated dry weight with post treatment blood pressures in the [id number] to [id number] systolic.    Filed Weights   09/08/15 2308  Weight: 94.62 kg (208 lb 9.6 oz)      ROS Balance of 12 systems is negative w/ exceptions as above  Outpt HD Orders Unit: [place] Days: THS Time: 3h38min Dialyzer: F180 EDW: 91kg K/Ca: 2/2.25 Access: AVF Needle Size: 15g BFR/DFR: 450/a1.5 UF Proflie: profile 4 VDRA: Hectorol 11qTx EPO: mircera 75 q2wk, last given 5/25 IV Fe: none Heparin: 9500 IVB Treatment Adherence: Very Poor, misses about 50% recently  PMH  Past Medical History  Diagnosis Date  . Asthma   . ESRD on hemodialysis ([place])     started dialysis 01/29/11  . Weight loss   . Loss of appetite   . CHF (congestive heart  failure) ([place])   . Hypertension     a. Also with hx of hypotension - has been started on midodrine.  . Hyperlipidemia   . Diabetes mellitus   . Peripheral neuropathy ([place])   . Iron deficiency anemia   . Hyperparathyroidism, secondary ([place])   . Sciatica   . GERD (gastroesophageal reflux disease)   . Adenoma   . Gastroparesis   . Esophageal erosions     a. Per EGD 10/2011.  Marland Kitchen CAD (coronary artery disease)     a. False positive stress echo 06/2012 at [facility] - cath with mild nonobstructive CAD at [facility] (mild luminal irregularities in LAD, moderate diffuse disease in distal RCA).  . History of echocardiogram     Echo 9/14: EF 55-60%, normal wall motion, normal diastolic function, PASP 21  . Chronic chest pain     a. H/o longstanding atypical CP. Nonobstructive cath 06/2012 and negative VQ.  Marland Kitchen ASCVD (arteriosclerotic cardiovascular disease)   . Anginal pain ([place])   . Peripheral vascular disease ([place])   . PONV (postoperative nausea and vomiting)   . Shortness of breath 07/12/2015  . Depression   . HCAP (healthcare-associated pneumonia) 07/12/2015   PSH  Past Surgical History  Procedure Laterality Date  . Cesarean section    . Cholecystectomy  1996  . Av fistula placement  08/2010    Left radiocephalic AVF  . Ligation goretex fistula  01/04/11    Left AVF  . Colonoscopy    . Esophagogastroduodenoscopy  10/19/2011    Dr. Silvano Rusk  . Foot surgery  Left   . Tubal ligation    . Left heart catheterization with coronary angiogram N/A 06/27/2012    Procedure: LEFT HEART CATHETERIZATION WITH CORONARY ANGIOGRAM;  Surgeon: Larey Dresser, MD;  Location[facility] C[facility] LAB;  Service: Cardiovascular;  Laterality: N/A;  . Fracture surgery Left     left femur  . Dilation and curettage of uterus    . Av fistula placement Right 05/07/2014    Procedure: ARTERIOVENOUS (AV) FISTULA CREATION;  Surgeon: Elam Dutch, MD;  Location: [place];  Service: Vascular;  Laterality: Right;  . Av fistula placement Right  07/02/2014    Procedure: INSERTION OF ARTERIOVENOUS (AV) GORE-TEX GRAFT ARM;  Surgeon: Elam Dutch, MD;  Location: [facility] OR;  Service: Vascular;  Laterality: Right;  . Thrombectomy and revision of arterioventous (av) goretex  graft Right 07/16/2014    Procedure: THROMBECTOMY  Right  arm  ARTERIOVENOUS  GORETEX  GRAFT;  Surgeon: Elam Dutch, MD;  Location: [place];  Service: Vascular;  Laterality: Right;  . Peripheral vascular catheterization Left 08/08/2014    Procedure: Upper Extremity Angiography;  Surgeon: Algernon Huxley, MD;  Location: [place] CV LAB;  Service: Cardiovascular;  Laterality: Left;  . Peripheral vascular catheterization Left 08/08/2014    Procedure: Upper Extremity Intervention;  Surgeon: Algernon Huxley, MD;  Location: [place] CV LAB;  Service: Cardiovascular;  Laterality: Left;  . Ligation of arteriovenous  fistula Left 08/21/2014    Procedure: LIGATION OF ARTERIOVENOUS  FISTULA;  Surgeon: Algernon Huxley, MD;  Location: [facility] ORS;  Service: Vascular;  Laterality: Left;   FH  Family History  Problem Relation Age of Onset  . Heart disease Mother     heart attack at 107 y/o-infected valve  . Kidney disease Mother     was a dialysis patient  . Heart disease Brother   . Kidney disease Maternal Grandmother     pre-dialysis  . Kidney failure Paternal Uncle   . Kidney failure Paternal Aunt   . Diabetes Father   . Hypertension Father   . Colon cancer Neg Hx   . Arthritis Brother   . Diabetes Maternal Aunt   . Hypertension Maternal Aunt   . Diabetes Paternal Aunt   . Heart disease Paternal Aunt   . Hypertension Paternal Aunt    SH  reports that she has never smoked. She has never used smokeless tobacco. She reports that she does not drink alcohol or use illicit drugs. Allergies  Allergies  Allergen Reactions  . Ciprofloxacin Hives, Itching and Nausea And Vomiting  . Fluocinolone Itching, Nausea And Vomiting and Other (See Comments)  . Pineapple Itching and Other (See  Comments)    Pt states that this medication makes her tongue raw.    . Strawberry Extract Itching and Other (See Comments)    Pt states that this medication makes her tongue raw.    . Tylenol [Acetaminophen] Itching  . Adhesive [Tape] Itching and Rash  . Chlorhexidine Gluconate Itching  . Clindamycin Diarrhea, Nausea And Vomiting and Rash  . Oxycodone Nausea And Vomiting, Rash and Other (See Comments)    Reaction:  Hallucinations   . Shrimp [Shellfish Allergy] Rash   Home medications Prior to Admission medications   Medication Sig Start Date End Date Taking? Authorizing Provider  acetaminophen-codeine (TYLENOL #3) 300-30 MG tablet Take 1 tablet by mouth every 8 (eight) hours as needed for moderate pain or severe pain.  08/26/15  Yes Historical Provider, MD  albuterol (PROVENTIL  HFA;VENTOLIN HFA) 108 (90 Base) MCG/ACT inhaler Inhale 2 puffs into the lungs every 4 (four) hours as needed for wheezing or shortness of breath. 09/01/15  Yes Orpah Greek, MD  amLODipine (NORVASC) 10 MG tablet Take 10 mg by mouth daily. 06/13/14  Yes Historical Provider, MD  aspirin EC 81 MG tablet Take 81 mg by mouth daily.   Yes Historical Provider, MD  Calcium Carbonate-Vitamin D (CALCIUM 600+D) 600-400 MG-UNIT tablet Take 1 tablet by mouth daily.   Yes Historical Provider, MD  citalopram (CELEXA) 10 MG tablet Take 10 mg by mouth at bedtime.    Yes Historical Provider, MD  fluticasone (FLOVENT HFA) 110 MCG/ACT inhaler Inhale 2 puffs into the lungs 2 (two) times daily. 03/26/15  Yes Rolland Porter, MD  gabapentin (NEURONTIN) 100 MG capsule Take 100-200 mg by mouth 2 (two) times daily. Pt takes one capsule in the morning and two at bedtime.   Yes Historical Provider, MD  HYDROcodone-homatropine (HYCODAN) 5-1.5 MG/5ML syrup Take 5 mLs by mouth every 6 (six) hours as needed for cough. 09/01/15  Yes Orpah Greek, MD  hydrOXYzine (ATARAX/VISTARIL) 25 MG tablet Take 25 mg by mouth every 4 (four) hours as needed  for itching.    Yes Historical Provider, MD  Insulin Detemir (LEVEMIR FLEXTOUCH) 100 UNIT/ML Pen Inject 22 Units into the skin every 12 (twelve) hours.    Yes Historical Provider, MD  labetalol (NORMODYNE) 300 MG tablet Take 300 mg by mouth 2 (two) times daily. 06/17/15  Yes Historical Provider, MD  linagliptin (TRADJENTA) 5 MG TABS tablet Take 5 mg by mouth at bedtime.   Yes Historical Provider, MD  methimazole (TAPAZOLE) 5 MG tablet Take 5 mg by mouth at bedtime.    Yes Historical Provider, MD  multivitamin (RENA-VIT) TABS tablet Take 1 tablet by mouth daily.   Yes Historical Provider, MD  omeprazole (PRILOSEC) 20 MG capsule Take 20 mg by mouth daily.   Yes Historical Provider, MD  predniSONE (STERAPRED UNI-PAK 21 TAB) 5 MG (21) TBPK tablet Take 5 mg by mouth See admin instructions. Taper dose 09/05/15  Yes Historical Provider, MD  sevelamer carbonate (RENVELA) 800 MG tablet Take 800-1,600 mg by mouth 5 (five) times daily. Pt takes two tablets three times daily with meals and one tablet two times daily with snacks.   Yes Historical Provider, MD  simvastatin (ZOCOR) 5 MG tablet Take 5 mg by mouth at bedtime.   Yes Historical Provider, MD  topiramate (TOPAMAX) 50 MG tablet Take 50 mg by mouth at bedtime.    Yes Historical Provider, MD  amLODipine (NORVASC) 5 MG tablet Take 1 tablet (5 mg total) by mouth at bedtime. Patient not taking: Reported on 09/08/2015 07/14/15   Geradine Girt, DO  HYDROcodone-acetaminophen (NORCO) 5-325 MG tablet Take 1 tablet by mouth every 6 (six) hours as needed for moderate pain. Patient not taking: Reported on 09/08/2015 07/14/15   Geradine Girt, DO  labetalol (NORMODYNE) 200 MG tablet Take 1 tablet (200 mg total) by mouth every Monday, Wednesday, and Friday at 8 PM. Patient not taking: Reported on 09/08/2015 07/14/15   Tomi Bamberger Vann, DO  ondansetron (ZOFRAN ODT) 4 MG disintegrating tablet Take 1 tablet (4 mg total) by mouth every 8 (eight) hours as needed for nausea or  vomiting. Patient not taking: Reported on 09/08/2015 06/17/15   Merryl Hacker, MD  ondansetron (ZOFRAN) 4 MG tablet Take 1 tablet (4 mg total) by mouth every 6 (six) hours. Patient not  taking: Reported on 09/08/2015 08/08/15   Virgel Manifold, MD  predniSONE (DELTASONE) 20 MG tablet Take 2 tablets (40 mg total) by mouth daily with breakfast. Patient not taking: Reported on 09/08/2015 09/01/15   Orpah Greek, MD    Current Medications Scheduled Meds: . amLODipine  10 mg Oral Daily  . aspirin EC  81 mg Oral Daily  . azithromycin  500 mg Intravenous Q24H  . budesonide  0.5 mg Inhalation BID  . calcium-vitamin D  1 tablet Oral Daily  . cefTRIAXone (ROCEPHIN)  IV  1 g Intravenous Q24H  . citalopram  10 mg Oral QHS  . gabapentin  100 mg Oral Daily  . gabapentin  200 mg Oral QHS  . heparin  5,000 Units Subcutaneous Q8H  . insulin aspart  0-15 Units Subcutaneous TID WC  . insulin detemir  22 Units Subcutaneous Q12H  . labetalol  300 mg Oral BID  . methimazole  5 mg Oral QHS  . multivitamin  1 tablet Oral Daily  . pantoprazole  40 mg Oral Daily  . sevelamer carbonate  1,600 mg Oral TID WC  . simvastatin  5 mg Oral QHS  . sodium chloride flush  3 mL Intravenous Q12H  . topiramate  50 mg Oral QHS   Continuous Infusions:  PRN Meds:.albuterol, hydrALAZINE, HYDROcodone-acetaminophen, HYDROcodone-homatropine, hydrOXYzine, sevelamer carbonate  CBC  Recent Labs Lab 09/08/15 1240 09/09/15 0030  WBC 10.8* 8.8  HGB 11.2* 11.6*  HCT 34.1* 36.0  MCV 83.0 82.2  PLT 275 0000000   Basic Metabolic Panel  Recent Labs Lab 09/08/15 1240 09/09/15 0030  NA 138  --   K 4.3  --   CL 104  --   CO2 18*  --   GLUCOSE 134*  --   BUN 72*  --   CREATININE 13.31* 13.79*  CALCIUM 8.5*  --     Physical Exam  Blood pressure 160/101, pulse 91, temperature 98.3 F (36.8 C), temperature source Oral, resp. rate 18, weight 94.62 kg (208 lb 9.6 oz), last menstrual period 08/13/2015, SpO2 96 %. GEN:   NAD ENT: NCAT EYES: EOMI CV: RRR, no rub, nl s1s2 PULM: bibasilar crackles, diminished bs at bases ABD: s/nt/nd SKIN: no rashes/lesions EXT:No LEE LFA AVF +B/T   A/P 1. ESRD: HD today, aggressive UF 2. Noncompliance with Medical Therapy: major issue driving this 3. HTN Urgency and Hypervolemia with PUlm Edema and Pleural Effusions: HD today, aggressive UF, cont home BP meds, probe fore lower EDW, need to correct #2 4. Anemia: Due for Mircera 6/8, Hb ok 5. MBD: Check Phos, cont VDRA and binders 6. CP: Troponin cycled, neg, see how responds to UF  Pearson Grippe MD 09/09/2015, 8:12 AM

## 2015-09-09 NOTE — Progress Notes (Signed)
Hemodialysis- BG resulted 33 (lab). Pt currently HD, asymptomatic and has eaten a sandwich. Rechecked BG=105. Continue to monitor.

## 2015-09-10 ENCOUNTER — Encounter (HOSPITAL_COMMUNITY): Payer: Self-pay | Admitting: *Deleted

## 2015-09-10 ENCOUNTER — Other Ambulatory Visit (HOSPITAL_COMMUNITY): Payer: Self-pay

## 2015-09-10 DIAGNOSIS — N186 End stage renal disease: Secondary | ICD-10-CM

## 2015-09-10 DIAGNOSIS — E1040 Type 1 diabetes mellitus with diabetic neuropathy, unspecified: Secondary | ICD-10-CM

## 2015-09-10 DIAGNOSIS — Z992 Dependence on renal dialysis: Secondary | ICD-10-CM

## 2015-09-10 DIAGNOSIS — E0590 Thyrotoxicosis, unspecified without thyrotoxic crisis or storm: Secondary | ICD-10-CM

## 2015-09-10 DIAGNOSIS — J181 Lobar pneumonia, unspecified organism: Secondary | ICD-10-CM

## 2015-09-10 LAB — RENAL FUNCTION PANEL
Albumin: 2.7 g/dL — ABNORMAL LOW (ref 3.5–5.0)
Anion gap: 13 (ref 5–15)
BUN: 25 mg/dL — AB (ref 6–20)
CALCIUM: 8.2 mg/dL — AB (ref 8.9–10.3)
CHLORIDE: 100 mmol/L — AB (ref 101–111)
CO2: 26 mmol/L (ref 22–32)
CREATININE: 7.43 mg/dL — AB (ref 0.44–1.00)
GFR, EST AFRICAN AMERICAN: 7 mL/min — AB (ref >60)
GFR, EST NON AFRICAN AMERICAN: 6 mL/min — AB (ref >60)
Glucose, Bld: 46 mg/dL — ABNORMAL LOW (ref 65–99)
Phosphorus: 5 mg/dL — ABNORMAL HIGH (ref 2.5–4.6)
Potassium: 3.6 mmol/L (ref 3.5–5.1)
SODIUM: 139 mmol/L (ref 135–145)

## 2015-09-10 LAB — CBC
HCT: 35.2 % — ABNORMAL LOW (ref 36.0–46.0)
Hemoglobin: 10.7 g/dL — ABNORMAL LOW (ref 12.0–15.0)
MCH: 25.6 pg — ABNORMAL LOW (ref 26.0–34.0)
MCHC: 30.4 g/dL (ref 30.0–36.0)
MCV: 84.2 fL (ref 78.0–100.0)
PLATELETS: 241 10*3/uL (ref 150–400)
RBC: 4.18 MIL/uL (ref 3.87–5.11)
RDW: 15.8 % — AB (ref 11.5–15.5)
WBC: 8.2 10*3/uL (ref 4.0–10.5)

## 2015-09-10 LAB — GLUCOSE, CAPILLARY
GLUCOSE-CAPILLARY: 32 mg/dL — AB (ref 65–99)
GLUCOSE-CAPILLARY: 204 mg/dL — AB (ref 65–99)
GLUCOSE-CAPILLARY: 142 mg/dL — AB (ref 65–99)
Glucose-Capillary: 149 mg/dL — ABNORMAL HIGH (ref 65–99)
Glucose-Capillary: 86 mg/dL (ref 65–99)
Glucose-Capillary: 160 mg/dL — ABNORMAL HIGH (ref 65–99)
Glucose-Capillary: 119 mg/dL — ABNORMAL HIGH (ref 65–99)

## 2015-09-10 LAB — HEPATITIS B SURFACE ANTIGEN: Hepatitis B Surface Ag: NEGATIVE

## 2015-09-10 LAB — HEMOGLOBIN A1C
Hgb A1c MFr Bld: 7 % — ABNORMAL HIGH (ref 4.8–5.6)
Mean Plasma Glucose: 154 mg/dL

## 2015-09-10 MED ORDER — AZITHROMYCIN 500 MG PO TABS
500.0000 mg | ORAL_TABLET | Freq: Every day | ORAL | Status: DC
  Administered 2015-09-10: 500 mg via ORAL
  Filled 2015-09-10 (×2): qty 1

## 2015-09-10 MED ORDER — DEXTROSE 50 % IV SOLN
INTRAVENOUS | Status: AC
  Administered 2015-09-10: 50 mL
  Filled 2015-09-10: qty 50

## 2015-09-10 MED ORDER — INSULIN DETEMIR 100 UNIT/ML ~~LOC~~ SOLN
10.0000 [IU] | Freq: Two times a day (BID) | SUBCUTANEOUS | Status: DC
  Administered 2015-09-10 (×2): 10 [IU] via SUBCUTANEOUS
  Filled 2015-09-10 (×4): qty 0.1

## 2015-09-10 NOTE — Progress Notes (Addendum)
Patient ID: Linda Ellison, female   DOB: [birth year], 40 y.o.   MRN: [id number]  PROGRESS NOTE    Linda Ellison  [id number] DOB: [birth year] DOA: 09/08/2015  PCP: Marijean Bravo, MD   Brief Narrative:  40 year old female with known ESRD (non compliant with HD sessions), HTN, presented to [facility] ED with main concern of progressive dyspnea. In ED, SBP was > 200 and CXR was notable for pulmonary vascular congestion .   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 - Counseled on diet   DVT prophylaxis: Heparin SubQ Code Status: Full Family Communication: No family at the bedside this am Disposition Plan: home 09/11/2015  Consultants:   Nephrology  Procedures:   None  Antimicrobials:   Rocephin 6/5 -->  Zithromax 6/5 -->    Subjective: Has headache, feels tired.  Objective: Filed Vitals:   09/10/15 0452 09/10/15 0600 09/10/15 0905 09/10/15 1420  BP:  140/92  149/92  Pulse:  89  91  Temp:    98 F (36.7 C)  TempSrc:    Oral  Resp:    19    Weight:      SpO2: 100%  98% 100%    Intake/Output Summary (Last 24 hours) at 09/10/15 1622 Last data filed at 09/10/15 0825  Gross per 24 hour  Intake    120 ml  Output   4026 ml  Net  -3906 ml   Filed Weights   09/08/15 2308 09/09/15 1318 09/09/15 1730  Weight: 94.62 kg (208 lb 9.6 oz) 94.4 kg (208 lb 1.8 oz) 90.2 kg (198 lb 13.7 oz)    Examination:  General exam: Appears calm and comfortable  Respiratory system: Clear to auscultation. Respiratory effort normal. Cardiovascular system: S1 & S2 heard, Rate controlled Gastrointestinal system: Abdomen is nondistended, soft and nontender. No organomegaly or masses felt. Normal bowel sounds heard. Central nervous system: Alert and oriented. No focal neurological deficits. Extremities: Symmetric 5 x 5 power. Skin: No rashes, lesions or ulcers Psychiatry: Judgement and insight appear normal. Mood & affect appropriate.   Data Reviewed: I have personally reviewed following labs and imaging studies  CBC:  Recent Labs Lab 09/08/15 1240 09/09/15 0030 09/10/15 0222  WBC 10.8* 8.8 8.2  HGB 11.2* 11.6* 10.7*  HCT 34.1* 36.0 35.2*  MCV 83.0 82.2 84.2  PLT 275 233 [id number]   Basic Metabolic Panel:  Recent Labs Lab 09/08/15 1240 09/09/15 0030 09/09/15 1532 09/10/15 0222  NA 138  --  138 139  K 4.3  --  3.6 3.6  CL  104  --  104 100*  CO2 18*  --  20* 26  GLUCOSE 134*  --  33* 46*  BUN 72*  --  75* 25*  CREATININE 13.31* 13.79* 14.11* 7.43*  CALCIUM 8.5*  --  8.6* 8.2*  PHOS  --   --  7.1* 5.0*   GFR: Estimated Creatinine Clearance: 11.5 mL/min (by C-G formula based on Cr of 7.43). Liver Function Tests:  Recent Labs Lab 09/09/15 1532 09/10/15 0222  ALBUMIN 2.9* 2.7*   No results for input(s): LIPASE, AMYLASE in the last 168 hours. No results for input(s): AMMONIA in the last 168 hours. Coagulation Profile: No results for input(s): INR, PROTIME in the last 168 hours. Cardiac Enzymes:  Recent Labs Lab  09/08/15 1653  TROPONINI 0.03   BNP (last 3 results) No results for input(s): PROBNP in the last 8760 hours. HbA1C:  Recent Labs  09/08/15 1653  HGBA1C 7.0*   CBG:  Recent Labs Lab 09/10/15 0406 09/10/15 0424 09/10/15 0621 09/10/15 0814 09/10/15 1116  GLUCAP 32* 149* 86 160* 204*   Lipid Profile: No results for input(s): CHOL, HDL, LDLCALC, TRIG, CHOLHDL, LDLDIRECT in the last 72 hours. Thyroid Function Tests:  Recent Labs  09/08/15 1653  TSH 2.385   Anemia Panel: No results for input(s): VITAMINB12, FOLATE, FERRITIN, TIBC, IRON, RETICCTPCT in the last 72 hours. Urine analysis:    Component Value Date/Time   COLORURINE YELLOW 08/06/2015 2308   APPEARANCEUR CLOUDY* 08/06/2015 2308   LABSPEC 1.017 08/06/2015 2308   PHURINE 6.0 08/06/2015 2308   GLUCOSEU 250* 08/06/2015 2308   GLUCOSEU >1000 06/08/2010   HGBUR SMALL* 08/06/2015 2308   BILIRUBINUR NEGATIVE 08/06/2015 2308   KETONESUR NEGATIVE 08/06/2015 2308   PROTEINUR >300* 08/06/2015 2308   UROBILINOGEN 0.2 05/04/2014 1339   NITRITE NEGATIVE 08/06/2015 2308   LEUKOCYTESUR NEGATIVE 08/06/2015 2308   Sepsis Labs: @LABRCNTIP (procalcitonin:4,lacticidven:4)   Recent Results (from the past 240 hour(s))  MRSA PCR Screening     Status: None   Collection Time: 09/09/15 12:41 AM  Result Value Ref Range Status   MRSA by PCR NEGATIVE NEGATIVE Final      Radiology Studies: Dg Chest 2 View 09/08/2015   Increased bilateral airspace disease appearing worst in the bases with small effusions could be due to pulmonary edema and/or pneumonia. Electronically Signed   By: Inge Rise M.D.   On: 09/08/2015 13:15      Scheduled Meds: . amLODipine  10 mg Oral Daily  . aspirin EC  81 mg Oral Daily  . azithromycin  500 mg Oral q1800  . budesonide  0.5 mg Inhalation BID  . calcium-vitamin D  1 tablet Oral Daily  . cefTRIAXone (ROCEPHIN)  IV  1 g Intravenous Q24H  . citalopram  10 mg Oral QHS  . gabapentin  100 mg  Oral Daily  . gabapentin  200 mg Oral QHS  . heparin  5,000 Units Subcutaneous Q8H  . insulin aspart  0-15 Units Subcutaneous TID WC  . insulin detemir  10 Units Subcutaneous Q12H  . labetalol  300 mg Oral BID  . methimazole  5 mg Oral QHS  . multivitamin  1 tablet Oral Daily  . pantoprazole  40 mg Oral Daily  . sevelamer carbonate  1,600 mg Oral TID WC  . simvastatin  5 mg Oral QHS  . sodium chloride flush  3 mL Intravenous Q12H  . topiramate  50 mg Oral QHS   Continuous Infusions:    LOS: 2 days  Time spent: 15 minutes  Greater than 50% of the time spent on counseling and coordinating the care.   Leisa Lenz, MD [facility] Pager [phone]  If 7PM-7AM, please contact night-coverage [email or web address] Password [email or web address] 09/10/2015, 4:22 PM

## 2015-09-10 NOTE — Progress Notes (Signed)
Admit: 09/08/2015 LOS: 2  37F ESRD admitted with HTN Urgency, Pulm Edema  Subjective:  HD yesterday, 4L UF, Post weight 90.2kg, says tolerated well Having some HA and diarrhea this AM Low blood sugar earlier   06/06 0701 - 06/07 0700 In: 240 [P.O.:240] Out: 4326 [Urine:300]  Filed Weights   09/08/15 2308 09/09/15 1318 09/09/15 1730  Weight: 94.62 kg (208 lb 9.6 oz) 94.4 kg (208 lb 1.8 oz) 90.2 kg (198 lb 13.7 oz)    Scheduled Meds: . amLODipine  10 mg Oral Daily  . aspirin EC  81 mg Oral Daily  . azithromycin  500 mg Oral q1800  . budesonide  0.5 mg Inhalation BID  . calcium-vitamin D  1 tablet Oral Daily  . cefTRIAXone (ROCEPHIN)  IV  1 g Intravenous Q24H  . citalopram  10 mg Oral QHS  . gabapentin  100 mg Oral Daily  . gabapentin  200 mg Oral QHS  . heparin  5,000 Units Subcutaneous Q8H  . insulin aspart  0-15 Units Subcutaneous TID WC  . insulin detemir  10 Units Subcutaneous Q12H  . labetalol  300 mg Oral BID  . methimazole  5 mg Oral QHS  . multivitamin  1 tablet Oral Daily  . pantoprazole  40 mg Oral Daily  . sevelamer carbonate  1,600 mg Oral TID WC  . simvastatin  5 mg Oral QHS  . sodium chloride flush  3 mL Intravenous Q12H  . topiramate  50 mg Oral QHS   Continuous Infusions:  PRN Meds:.albuterol, hydrALAZINE, HYDROcodone-homatropine, hydrOXYzine, morphine injection, sevelamer carbonate  Current Labs: reviewed    Physical Exam:  Blood pressure 140/92, pulse 89, temperature 97.7 F (36.5 C), temperature source Oral, resp. rate 18, weight 90.2 kg (198 lb 13.7 oz), last menstrual period 08/13/2015, SpO2 98 %. GEN: NAD ENT: NCAT EYES: EOMI CV: RRR, no rub, nl s1s2 PULM: bibasilar crackles, diminished bs at bases ABD: s/nt/nd SKIN: no rashes/lesions EXT:No LEE LFA AVF +B/T  Outpt HD Orders Unit: [place] Days: THS Time: 3h44min Dialyzer: F180 EDW: 91kg K/Ca: 2/2.25 Access: AVF Needle Size: 15g BFR/DFR: 450/a1.5 UF Proflie: profile 4 VDRA:  Hectorol 11qTx EPO: mircera 75 q2wk, last given 5/25 IV Fe: none Heparin: 9500 IVB Treatment Adherence: Very Poor, misses about 50% recently  A 1. ESRD: THS AF [place] 2. Noncompliance with Medical Therapy: major issue driving this 3. HTN Urgency and Hypervolemia with PUlm Edema and Pleural Effusions: improved with UF, needs lower EDW, need to correct #2 4. Anemia: Due for Mircera 6/8, Hb ok 5. MBD: Check Phos, cont VDRA and binders 6. CP: Troponin cycled, neg, see how responds to UF  P 1. Cont HD on THS Schedule 2. Probe lower EDW, encourage adherence 3. ESA tomorrow if here -- aranesp 60  Pearson Grippe MD 09/10/2015, 9:53 AM   Recent Labs Lab 09/08/15 1240 09/09/15 0030 09/09/15 1532 09/10/15 0222  NA 138  --  138 139  K 4.3  --  3.6 3.6  CL 104  --  104 100*  CO2 18*  --  20* 26  GLUCOSE 134*  --  33* 46*  BUN 72*  --  75* 25*  CREATININE 13.31* 13.79* 14.11* 7.43*  CALCIUM 8.5*  --  8.6* 8.2*  PHOS  --   --  7.1* 5.0*    Recent Labs Lab 09/08/15 1240 09/09/15 0030 09/10/15 0222  WBC 10.8* 8.8 8.2  HGB 11.2* 11.6* 10.7*  HCT 34.1* 36.0 35.2*  MCV  83.0 82.2 84.2  PLT 275 233 241

## 2015-09-10 NOTE — Progress Notes (Signed)
Inpatient Diabetes Program Recommendations  AACE/ADA: New Consensus Statement on Inpatient Glycemic Control (2015)  Target Ranges:  Prepandial:   less than 140 mg/dL      Peak postprandial:   less than 180 mg/dL (1-2 hours)      Critically ill patients:  140 - 180 mg/dL   Results for Linda Ellison, Linda Ellison (MRN [id number]) as of 09/10/2015 09:34  Ref. Range 09/09/2015 21:17 09/10/2015 04:06 09/10/2015 04:24 09/10/2015 06:21 09/10/2015 08:14  Glucose-Capillary Latest Ref Range: 65-99 mg/dL 111 (H) 32 (LL) 149 (H) 86 160 (H)   Review of Glycemic Control  Diabetes history: DM 2 Outpatient Diabetes medications: Levemir 22 units bid + Tradjenta 5 mg q hs Current orders for Inpatient glycemic control: Levemir 10 units bid + Novolog correction 0-15 units tid  Inpatient Diabetes Program Recommendations:  Noted decrease in Levemir due to low CBGs post correction coverage. Please consider decrease in Novolog correction to sensitve 0-9 units tid with meals.  Thank you, Nani Gasser. Roberth Berling, RN, MSN, CDE Inpatient Glycemic Control Team Team Pager [phone] (8am-5pm) 09/10/2015 9:37 AM

## 2015-09-10 NOTE — Progress Notes (Addendum)
Hypoglycemic Event  CBG: 32  Treatment: amp D50. See MAR  Symptoms: sweating, lethargic  Follow-up CBG: Time:0429 CBG Result:149  Possible Reasons for Event: After administration of Levemir at HS pt threw up her dinner and did not notify staff. She did not eat or snack again after vomiting.    Comments/MD notified:Yes  Pt also complained of CP. EKG performed and no change from previous EKG noted. Morphine given with some relief. Pt stated that a breathing treatment usually helps her CPs. Respiratory notified for a treatment. Rapid Response also notified and assisted with event. Pt is now fully alert and oriented and asked for some cranberry juice and graham crackers and peanut butter. She had a similar hypoglycemia episode in dialysis today. Pt is now stable, alert and oriented. Will continue to monitor pt closely.     Gabbie Marzo, [employer]

## 2015-09-11 ENCOUNTER — Other Ambulatory Visit (HOSPITAL_COMMUNITY): Payer: Self-pay

## 2015-09-11 DIAGNOSIS — E785 Hyperlipidemia, unspecified: Secondary | ICD-10-CM

## 2015-09-11 DIAGNOSIS — R0789 Other chest pain: Secondary | ICD-10-CM

## 2015-09-11 DIAGNOSIS — I10 Essential (primary) hypertension: Secondary | ICD-10-CM

## 2015-09-11 LAB — RENAL FUNCTION PANEL
Albumin: 2.7 g/dL — ABNORMAL LOW (ref 3.5–5.0)
Anion gap: 10 (ref 5–15)
BUN: 35 mg/dL — ABNORMAL HIGH (ref 6–20)
CHLORIDE: 103 mmol/L (ref 101–111)
CO2: 24 mmol/L (ref 22–32)
Calcium: 8.2 mg/dL — ABNORMAL LOW (ref 8.9–10.3)
Creatinine, Ser: 9.67 mg/dL — ABNORMAL HIGH (ref 0.44–1.00)
GFR, EST AFRICAN AMERICAN: 5 mL/min — AB (ref >60)
GFR, EST NON AFRICAN AMERICAN: 5 mL/min — AB (ref >60)
Glucose, Bld: 119 mg/dL — ABNORMAL HIGH (ref 65–99)
PHOSPHORUS: 5.9 mg/dL — AB (ref 2.5–4.6)
POTASSIUM: 4.3 mmol/L (ref 3.5–5.1)
Sodium: 137 mmol/L (ref 135–145)

## 2015-09-11 LAB — LEGIONELLA PNEUMOPHILA SEROGP 1 UR AG: L. PNEUMOPHILA SEROGP 1 UR AG: NEGATIVE

## 2015-09-11 LAB — CBC
HCT: 34.2 % — ABNORMAL LOW (ref 36.0–46.0)
HEMOGLOBIN: 10.4 g/dL — AB (ref 12.0–15.0)
MCH: 25.9 pg — ABNORMAL LOW (ref 26.0–34.0)
MCHC: 30.4 g/dL (ref 30.0–36.0)
MCV: 85.3 fL (ref 78.0–100.0)
Platelets: 224 10*3/uL (ref 150–400)
RBC: 4.01 MIL/uL (ref 3.87–5.11)
RDW: 15.9 % — ABNORMAL HIGH (ref 11.5–15.5)
WBC: 8.4 10*3/uL (ref 4.0–10.5)

## 2015-09-11 LAB — GLUCOSE, CAPILLARY: Glucose-Capillary: 118 mg/dL — ABNORMAL HIGH (ref 65–99)

## 2015-09-11 MED ORDER — AZITHROMYCIN 500 MG PO TABS: 500.0000 mg | ORAL_TABLET | Freq: Every day | ORAL | Status: DC

## 2015-09-11 MED ORDER — HEPARIN SODIUM (PORCINE) 1000 UNIT/ML DIALYSIS
100.0000 [IU]/kg | INTRAMUSCULAR | Status: DC | PRN
  Administered 2015-09-11: 9000 [IU] via INTRAVENOUS_CENTRAL
  Filled 2015-09-11 (×2): qty 9

## 2015-09-11 MED ORDER — CEFPODOXIME PROXETIL 200 MG PO TABS: 200.0000 mg | ORAL_TABLET | Freq: Two times a day (BID) | ORAL | Status: DC

## 2015-09-11 MED ORDER — MORPHINE SULFATE (PF) 2 MG/ML IV SOLN
INTRAVENOUS | Status: AC
  Filled 2015-09-11: qty 1

## 2015-09-11 NOTE — Procedures (Signed)
I was present at this dialysis session. I have reviewed the session itself and made appropriate changes.   Preweight 92.6kg, UF goal 4L; outpt EDW is 91kg, will use post weight as new EDW for DC, might need to challenge further IF will come and stay for HD.  2K bath.  AVF.  C/o HA.  Says diarrhea improved.  No further inpatient renal needs. Next HD 6/10.    Recent Labs Lab 09/11/15 0745  NA 137  K 4.3  CL 103  CO2 24  GLUCOSE 119*  BUN 35*  CREATININE 9.67*  CALCIUM 8.2*  PHOS 5.9*     Recent Labs Lab 09/09/15 0030 09/10/15 0222 09/11/15 0745  WBC 8.8 8.2 8.4  HGB 11.6* 10.7* 10.4*  HCT 36.0 35.2* 34.2*  MCV 82.2 84.2 85.3  PLT 233 241 224    Scheduled Meds: . amLODipine  10 mg Oral Daily  . aspirin EC  81 mg Oral Daily  . azithromycin  500 mg Oral q1800  . budesonide  0.5 mg Inhalation BID  . calcium-vitamin D  1 tablet Oral Daily  . cefTRIAXone (ROCEPHIN)  IV  1 g Intravenous Q24H  . citalopram  10 mg Oral QHS  . gabapentin  100 mg Oral Daily  . gabapentin  200 mg Oral QHS  . heparin  5,000 Units Subcutaneous Q8H  . insulin aspart  0-15 Units Subcutaneous TID WC  . insulin detemir  10 Units Subcutaneous Q12H  . labetalol  300 mg Oral BID  . methimazole  5 mg Oral QHS  . multivitamin  1 tablet Oral Daily  . pantoprazole  40 mg Oral Daily  . sevelamer carbonate  1,600 mg Oral TID WC  . simvastatin  5 mg Oral QHS  . sodium chloride flush  3 mL Intravenous Q12H  . topiramate  50 mg Oral QHS   Continuous Infusions:  PRN Meds:.albuterol, heparin, hydrALAZINE, HYDROcodone-homatropine, hydrOXYzine, morphine injection, sevelamer carbonate   Pearson Grippe  MD 09/11/2015, 8:39 AM

## 2015-09-11 NOTE — Discharge Summary (Signed)
Physician Discharge Summary  Linda Ellison [id number] DOB: [birth year] DOA: 09/08/2015  PCP: Marijean Bravo, MD  Admit date: 09/08/2015 Discharge date: 09/11/2015  Recommendations for Outpatient Follow-up:  Continue azithromycin for 1 more dose on discharge  Continue cefpodoxime for 3 days for possible pneumonia  Discharge Diagnoses:  Active Problems:   Obesity, unspecified   Diabetes mellitus with nephropathy (HCC)   Diabetic nephropathy (HCC)   Chest pain   ESRD (end stage renal disease) on dialysis (HCC)   PNA (pneumonia)    Discharge Condition: stable   Diet recommendation: as tolerated   History of present illness:  40 year old female with known ESRD (non compliant with HD sessions), HTN, presented to [facility] ED with main concern of progressive dyspnea. In ED, SBP was > 200 and CXR was notable for pulmonary vascular congestion .    Hospital Course:    Assessment & Plan:  Pulmonary vascular congestion secondary to ESRD - Continue HD per scheduled TTS - Stable  - HD today prior to discharge   Bilateral pneumonia, unspecified organism  - Was on Zithromax and Rocephin - Legionella negative - Blood cultures not ordered at the time of the admission - She will continue azithromycin for 1 more day of discharge and cefpodoxime for 3 more days on discharge   Accelerated hypertension  - In the setting of medical non compliance with HD treatment with subsequent pulmonary vascular congestion - Continue Norvasc and labetalol   Chest pain - Trop negative - Chest pain free this am   Anemia of chronic ESRD - Hemoglobin stable - No reports of bleeding   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 - Counseled on diet   DVT prophylaxis: Heparin SubQ Code Status: Full Family Communication: No family at the bedside this am   Consultants:   Nephrology  Procedures:   None  Antimicrobials:   Rocephin 6/5 -->  Zithromax 6/5 -->     Signed:  Leisa Lenz, MD  [facility] 09/11/2015, 9:32 AM  Pager #: [phone]  Time spent in minutes: less than 30 minutes   Discharge Exam: Filed Vitals:   09/11/15 0830 09/11/15 0901  BP: 146/86 152/89  Pulse: 98 98  Temp:    Resp:     Filed Vitals:   09/11/15 0745 09/11/15 0800 09/11/15 0830 09/11/15 0901  BP: 135/89 145/88 146/86 152/89  Pulse: 95 97 98 98  Temp:      TempSrc:      Resp:      Weight:      SpO2:        General: Pt is alert, follows commands appropriately, not in acute distress Cardiovascular: Regular rate and rhythm, S1/S2 + Respiratory: Clear to auscultation bilaterally, no wheezing, no crackles, no rhonchi Abdominal: Soft, non tender, non distended, bowel sounds +, no guarding Extremities: no edema, no cyanosis, pulses palpable bilaterally DP and PT Neuro: Grossly nonfocal  Discharge Instructions  Discharge Instructions    Call MD for:  difficulty breathing, headache or visual disturbances    Complete by:  As directed      Call MD for:  persistant dizziness or light-headedness    Complete by:  As directed  Call MD for:  persistant nausea and vomiting    Complete by:  As directed      Call MD for:  severe uncontrolled pain    Complete by:  As directed      Diet - low sodium heart healthy    Complete by:  As directed      Discharge instructions    Complete by:  As directed   Continue azithromycin for 1 more dose on discharge  Continue cefpodoxime for 3 days for possible pneumonia     Increase activity slowly    Complete by:  As directed             Medication List    STOP taking these medications        HYDROcodone-acetaminophen  5-325 MG tablet  Commonly known as:  NORCO     ondansetron 4 MG disintegrating tablet  Commonly known as:  ZOFRAN ODT     ondansetron 4 MG tablet  Commonly known as:  ZOFRAN     predniSONE 20 MG tablet  Commonly known as:  DELTASONE     predniSONE 5 MG (21) Tbpk tablet  Commonly known as:  STERAPRED UNI-PAK 21 TAB      TAKE these medications        acetaminophen-codeine 300-30 MG tablet  Commonly known as:  TYLENOL #3  Take 1 tablet by mouth every 8 (eight) hours as needed for moderate pain or severe pain.     albuterol 108 (90 Base) MCG/ACT inhaler  Commonly known as:  PROVENTIL HFA;VENTOLIN HFA  Inhale 2 puffs into the lungs every 4 (four) hours as needed for wheezing or shortness of breath.     amLODipine 10 MG tablet  Commonly known as:  NORVASC  Take 10 mg by mouth daily.     aspirin EC 81 MG tablet  Take 81 mg by mouth daily.     azithromycin 500 MG tablet  Commonly known as:  ZITHROMAX  Take 1 tablet (500 mg total) by mouth daily at 6 PM.     CALCIUM 600+D 600-400 MG-UNIT tablet  Generic drug:  Calcium Carbonate-Vitamin D  Take 1 tablet by mouth daily.     cefpodoxime 200 MG tablet  Commonly known as:  VANTIN  Take 1 tablet (200 mg total) by mouth 2 (two) times daily.     citalopram 10 MG tablet  Commonly known as:  CELEXA  Take 10 mg by mouth at bedtime.     fluticasone 110 MCG/ACT inhaler  Commonly known as:  FLOVENT HFA  Inhale 2 puffs into the lungs 2 (two) times daily.     gabapentin 100 MG capsule  Commonly known as:  NEURONTIN  Take 100-200 mg by mouth 2 (two) times daily. Pt takes one capsule in the morning and two at bedtime.     HYDROcodone-homatropine 5-1.5 MG/5ML syrup  Commonly known as:  HYCODAN  Take 5 mLs by mouth every 6 (six) hours as needed for cough.     hydrOXYzine 25 MG tablet  Commonly known as:  ATARAX/VISTARIL  Take 25 mg by mouth every 4 (four) hours as needed for itching.     labetalol 300 MG tablet  Commonly known  as:  NORMODYNE  Take 300 mg by mouth 2 (two) times daily.     LEVEMIR FLEXTOUCH 100 UNIT/ML Pen  Generic drug:  Insulin Detemir  Inject 22 Units into the skin every 12 (twelve) hours.     methimazole 5 MG tablet  Commonly known as:  TAPAZOLE  Take 5 mg by mouth at bedtime.     multivitamin Tabs tablet  Take 1 tablet by mouth daily.     omeprazole 20 MG capsule  Commonly known as:  PRILOSEC  Take 20 mg by mouth daily.     sevelamer carbonate 800 MG tablet  Commonly known as:  RENVELA  Take 800-1,600 mg by mouth 5 (five) times daily. Pt takes two tablets three times daily with meals and one tablet two times daily with snacks.     simvastatin 5 MG tablet  Commonly known as:  ZOCOR  Take 5 mg by mouth at bedtime.     topiramate 50 MG tablet  Commonly known as:  TOPAMAX  Take 50 mg by mouth at bedtime.     TRADJENTA 5 MG Tabs tablet  Generic drug:  linagliptin  Take 5 mg by mouth at bedtime.           Follow-up Information    Follow up with TALBOT, DAVID C, MD. Schedule an appointment as soon as possible for a visit in 1 week.   Specialty:  Internal Medicine   Why:  Follow up appt after recent hospitalization   Contact information:   [address] [place] [address] [phone]        The results of significant diagnostics from this hospitalization (including imaging, microbiology, ancillary and laboratory) are listed below for reference.    Significant Diagnostic Studies: Dg Chest 2 View  09/08/2015  CLINICAL DATA:  Cough, shortness of breath and chest tightness beginning yesterday. EXAM: CHEST  2 VIEW COMPARISON:  PA and lateral chest 09/01/2015, 07/13/2015 and 02/24/2015. CT chest 07/12/2015. FINDINGS: The patient has new small bilateral pleural effusions. Bilateral airspace disease appearing worst the bases has increased since the most recent examination. No pneumothorax is identified. Heart size is mildly enlarged. IMPRESSION: Increased bilateral  airspace disease appearing worst in the bases with small effusions could be due to pulmonary edema and/or pneumonia. Electronically Signed   By: Inge Rise M.D.   On: 09/08/2015 13:15   Dg Chest 2 View  09/01/2015  CLINICAL DATA:  Chest pain and pressure for 2 weeks, intermittent, progressed today. Shortness of breath. EXAM: CHEST  2 VIEW COMPARISON:  Radiographs and CT April 2017, radiographs 06/17/2015 FINDINGS: Worsening bibasilar interstitial opacities, likely pulmonary edema. Small bilateral pleural effusions, left greater than right. The heart size and mediastinal contours are unchanged. No confluent airspace disease. No pneumothorax. IMPRESSION: Interstitial bibasilar opacities consistent likely worsening pulmonary edema. This appears waxing and waning within April 2017, currently increased. Small pleural effusions again seen. Electronically Signed   By: Jeb Levering M.D.   On: 09/01/2015 01:02    Microbiology: Recent Results (from the past 240 hour(s))  MRSA PCR Screening     Status: None   Collection Time: 09/09/15 12:41 AM  Result Value Ref Range Status   MRSA by PCR NEGATIVE NEGATIVE Final    Comment:        The GeneXpert MRSA Assay (FDA approved for NASAL specimens only), is one component of a comprehensive MRSA colonization surveillance program. It is not intended to diagnose MRSA infection nor to guide or monitor treatment for MRSA infections.      Labs: Basic Metabolic Panel:  Recent Labs Lab 09/08/15 1240 09/09/15 0030 09/09/15 1532 09/10/15 0222 09/11/15 0745  NA 138  --  138 139 137  K 4.3  --  3.6 3.6 4.3  CL  104  --  104 100* 103  CO2 18*  --  20* 26 24  GLUCOSE 134*  --  33* 46* 119*  BUN 72*  --  75* 25* 35*  CREATININE 13.31* 13.79* 14.11* 7.43* 9.67*  CALCIUM 8.5*  --  8.6* 8.2* 8.2*  PHOS  --   --  7.1* 5.0* 5.9*   Liver Function Tests:  Recent Labs Lab 09/09/15 1532 09/10/15 0222 09/11/15 0745  ALBUMIN 2.9* 2.7* 2.7*   No  results for input(s): LIPASE, AMYLASE in the last 168 hours. No results for input(s): AMMONIA in the last 168 hours. CBC:  Recent Labs Lab 09/08/15 1240 09/09/15 0030 09/10/15 0222 09/11/15 0745  WBC 10.8* 8.8 8.2 8.4  HGB 11.2* 11.6* 10.7* 10.4*  HCT 34.1* 36.0 35.2* 34.2*  MCV 83.0 82.2 84.2 85.3  PLT 275 233 241 224   Cardiac Enzymes:  Recent Labs Lab 09/08/15 1653  TROPONINI 0.03   BNP: BNP (last 3 results)  Recent Labs  09/08/15 1240  BNP 1381.5*    ProBNP (last 3 results) No results for input(s): PROBNP in the last 8760 hours.  CBG:  Recent Labs Lab 09/10/15 0814 09/10/15 1116 09/10/15 1628 09/10/15 2229 09/11/15 0622  GLUCAP 160* 204* 119* 142* 118*

## 2015-09-11 NOTE — Discharge Instructions (Signed)

## 2015-10-09 ENCOUNTER — Inpatient Hospital Stay (HOSPITAL_COMMUNITY)
Admission: AD | Admit: 2015-10-09 | Discharge: 2015-10-09 | Disposition: A | Discharge status: Home / Self Care | Payer: Medicare Other | Source: Ambulatory Visit | Attending: Obstetrics and Gynecology | Admitting: Obstetrics and Gynecology

## 2015-10-09 ENCOUNTER — Encounter (HOSPITAL_COMMUNITY): Payer: Self-pay | Admitting: *Deleted

## 2015-10-09 DIAGNOSIS — Z7982 Long term (current) use of aspirin: Secondary | ICD-10-CM | POA: Diagnosis not present

## 2015-10-09 DIAGNOSIS — I509 Heart failure, unspecified: Secondary | ICD-10-CM | POA: Diagnosis not present

## 2015-10-09 DIAGNOSIS — N186 End stage renal disease: Secondary | ICD-10-CM | POA: Diagnosis not present

## 2015-10-09 DIAGNOSIS — K219 Gastro-esophageal reflux disease without esophagitis: Secondary | ICD-10-CM | POA: Insufficient documentation

## 2015-10-09 DIAGNOSIS — R1084 Generalized abdominal pain: Secondary | ICD-10-CM

## 2015-10-09 DIAGNOSIS — E1122 Type 2 diabetes mellitus with diabetic chronic kidney disease: Secondary | ICD-10-CM | POA: Insufficient documentation

## 2015-10-09 DIAGNOSIS — I132 Hypertensive heart and chronic kidney disease with heart failure and with stage 5 chronic kidney disease, or end stage renal disease: Secondary | ICD-10-CM | POA: Insufficient documentation

## 2015-10-09 DIAGNOSIS — Z79899 Other long term (current) drug therapy: Secondary | ICD-10-CM | POA: Insufficient documentation

## 2015-10-09 DIAGNOSIS — I2510 Atherosclerotic heart disease of native coronary artery without angina pectoris: Secondary | ICD-10-CM | POA: Diagnosis not present

## 2015-10-09 DIAGNOSIS — Z98891 History of uterine scar from previous surgery: Secondary | ICD-10-CM | POA: Insufficient documentation

## 2015-10-09 DIAGNOSIS — Z794 Long term (current) use of insulin: Secondary | ICD-10-CM | POA: Diagnosis not present

## 2015-10-09 DIAGNOSIS — E785 Hyperlipidemia, unspecified: Secondary | ICD-10-CM | POA: Insufficient documentation

## 2015-10-09 DIAGNOSIS — Z992 Dependence on renal dialysis: Secondary | ICD-10-CM | POA: Diagnosis not present

## 2015-10-09 DIAGNOSIS — R102 Pelvic and perineal pain: Secondary | ICD-10-CM | POA: Insufficient documentation

## 2015-10-09 LAB — URINALYSIS, ROUTINE W REFLEX MICROSCOPIC
BILIRUBIN URINE: NEGATIVE
GLUCOSE, UA: 250 mg/dL — AB
KETONES UR: NEGATIVE mg/dL
LEUKOCYTES UA: NEGATIVE
NITRITE: NEGATIVE
Specific Gravity, Urine: 1.02 (ref 1.005–1.030)
pH: 8 (ref 5.0–8.0)

## 2015-10-09 LAB — CBC
HEMATOCRIT: 33.1 % — AB (ref 36.0–46.0)
HEMOGLOBIN: 10.9 g/dL — AB (ref 12.0–15.0)
MCH: 27.5 pg (ref 26.0–34.0)
MCHC: 32.9 g/dL (ref 30.0–36.0)
MCV: 83.4 fL (ref 78.0–100.0)
Platelets: 244 10*3/uL (ref 150–400)
RBC: 3.97 MIL/uL (ref 3.87–5.11)
RDW: 15.7 % — ABNORMAL HIGH (ref 11.5–15.5)
WBC: 6.2 10*3/uL (ref 4.0–10.5)

## 2015-10-09 LAB — GC/CHLAMYDIA PROBE AMP (~~LOC~~) NOT AT ARMC
CHLAMYDIA, DNA PROBE: NEGATIVE
NEISSERIA GONORRHEA: NEGATIVE

## 2015-10-09 LAB — WET PREP, GENITAL
SPERM: NONE SEEN
Trich, Wet Prep: NONE SEEN
Yeast Wet Prep HPF POC: NONE SEEN

## 2015-10-09 LAB — RPR: RPR Ser Ql: NONREACTIVE

## 2015-10-09 LAB — URINE MICROSCOPIC-ADD ON

## 2015-10-09 LAB — HCG, QUANTITATIVE, PREGNANCY: hCG, Beta Chain, Quant, S: 2 m[IU]/mL (ref <5)

## 2015-10-09 LAB — HIV ANTIBODY (ROUTINE TESTING W REFLEX): HIV SCREEN 4TH GENERATION: NONREACTIVE

## 2015-10-09 MED ORDER — LABETALOL HCL 100 MG PO TABS
300.0000 mg | ORAL_TABLET | Freq: Once | ORAL | Status: AC
  Administered 2015-10-09: 300 mg via ORAL
  Filled 2015-10-09: qty 3

## 2015-10-09 MED ORDER — HYDRALAZINE HCL 25 MG PO TABS
25.0000 mg | ORAL_TABLET | Freq: Once | ORAL | Status: AC
  Administered 2015-10-09: 25 mg via ORAL
  Filled 2015-10-09: qty 1

## 2015-10-09 MED ORDER — AMLODIPINE BESYLATE 10 MG PO TABS
10.0000 mg | ORAL_TABLET | Freq: Every day | ORAL | Status: DC
  Filled 2015-10-09: qty 1

## 2015-10-09 MED ORDER — AMLODIPINE BESYLATE 10 MG PO TABS
10.0000 mg | ORAL_TABLET | Freq: Once | ORAL | Status: AC
  Administered 2015-10-09: 10 mg via ORAL

## 2015-10-09 NOTE — MAU Note (Addendum)
PT SAYS SHE HAS LOWER RIGHT ABD  PAIN-   STARTED  ON Monday-  NO MEDS   FOR PAIN.      [place]  , NP    ON  01-2015.       VOMITED STARTED   Friday.    NO BIRTH  CONTROL.    HPT- POSITIVE-  IN June.        THEN HAD SPOTTING  IN BED  X2 DAY -     DIALYSIS     CENTER-     POSITIVE  UPT.

## 2015-10-09 NOTE — MAU Provider Note (Signed)
History     CSN: [id number]  Arrival date and time: 10/09/15 [id number]   First Provider Initiated Contact with Patient 10/09/15 0206      Chief Complaint  Patient presents with  . Pelvic Pain   HPI Comments: ADORABELLA RESENDIZ is a 40 y.o. [id number] at Unknown who presents today with abdominal pain. She states that she had a period on 08/09/15, and the on 09/25/15 she had 2 day of heavy vaginal bleeding followed by spotting. She states that she had a +UPT at home on 09/21/15 and she had a +UPT the dialysis center around that same time. She states that she had tubal ligation about 15 years ago. Patient has CHTN. She is on labetalol, amlodipine and hydralazine for her BP. She states that she did not take her evening doses prior to coming here.   Pelvic Pain The patient's primary symptoms include pelvic pain. This is a new problem. The current episode started in the past 7 days. The problem occurs intermittently. The problem has been unchanged. Pain severity now: 8/10  The problem affects both sides. Pregnant now: possible  The vaginal discharge was normal. There has been no bleeding. She has tried nothing for the symptoms. She is sexually active. She uses tubal ligation for contraception. Her menstrual history has been regular (08/09/15 ).   Past Medical History  Diagnosis Date  . Asthma   . ESRD on hemodialysis ([place])     started dialysis 01/29/11  . Weight loss   . Loss of appetite   . CHF (congestive heart failure) ([place])   . Hypertension     a. Also with hx of hypotension - has been started on midodrine.  . Hyperlipidemia   . Diabetes mellitus   . Peripheral neuropathy ([place])   . Iron deficiency anemia   . Hyperparathyroidism, secondary ([place])   . Sciatica   . GERD (gastroesophageal reflux disease)   . Adenoma   . Gastroparesis   . Esophageal erosions     a. Per EGD 10/2011.  Marland Kitchen CAD (coronary artery disease)     a. False positive stress echo 06/2012 at [facility] - cath with mild nonobstructive CAD at  [facility] (mild luminal irregularities in LAD, moderate diffuse disease in distal RCA).  . History of echocardiogram     Echo 9/14: EF 55-60%, normal wall motion, normal diastolic function, PASP 21  . Chronic chest pain     a. H/o longstanding atypical CP. Nonobstructive cath 06/2012 and negative VQ.  Marland Kitchen ASCVD (arteriosclerotic cardiovascular disease)   . Anginal pain ([place])   . Peripheral vascular disease ([place])   . PONV (postoperative nausea and vomiting)   . Shortness of breath 07/12/2015  . Depression   . HCAP (healthcare-associated pneumonia) 07/12/2015    Past Surgical History  Procedure Laterality Date  . Cesarean section    . Cholecystectomy  1996  . Av fistula placement  08/2010    Left radiocephalic AVF  . Ligation goretex fistula  01/04/11    Left AVF  . Colonoscopy    . Esophagogastroduodenoscopy  10/19/2011    Dr. Silvano Rusk  . Foot surgery Left   . Tubal ligation    . Left heart catheterization with coronary angiogram N/A 06/27/2012    Procedure: LEFT HEART CATHETERIZATION WITH CORONARY ANGIOGRAM;  Surgeon: Larey Dresser, MD;  Location: [facility] CATH LAB;  Service: Cardiovascular;  Laterality: N/A;  . Fracture surgery Left     left femur  . Dilation and curettage of uterus    .  Av fistula placement Right 05/07/2014    Procedure: ARTERIOVENOUS (AV) FISTULA CREATION;  Surgeon: Elam Dutch, MD;  Location: [place];  Service: Vascular;  Laterality: Right;  . Av fistula placement Right 07/02/2014    Procedure: INSERTION OF ARTERIOVENOUS (AV) GORE-TEX GRAFT ARM;  Surgeon: Elam Dutch, MD;  Location: [facility] OR;  Service: Vascular;  Laterality: Right;  . Thrombectomy and revision of arterioventous (av) goretex  graft Right 07/16/2014    Procedure: THROMBECTOMY  Right  arm  ARTERIOVENOUS  GORETEX  GRAFT;  Surgeon: Elam Dutch, MD;  Location: [place];  Service: Vascular;  Laterality: Right;  . Peripheral vascular catheterization Left 08/08/2014    Procedure: Upper Extremity Angiography;   Surgeon: Algernon Huxley, MD;  Location: [place] CV LAB;  Service: Cardiovascular;  Laterality: Left;  . Peripheral vascular catheterization Left 08/08/2014    Procedure: Upper Extremity Intervention;  Surgeon: Algernon Huxley, MD;  Location: [place] CV LAB;  Service: Cardiovascular;  Laterality: Left;  . Ligation of arteriovenous  fistula Left 08/21/2014    Procedure: LIGATION OF ARTERIOVENOUS  FISTULA;  Surgeon: Algernon Huxley, MD;  Location: [facility] ORS;  Service: Vascular;  Laterality: Left;    Family History  Problem Relation Age of Onset  . Heart disease Mother     heart attack at 26 y/o-infected valve  . Kidney disease Mother     was a dialysis patient  . Heart disease Brother   . Kidney disease Maternal Grandmother     pre-dialysis  . Kidney failure Paternal Uncle   . Kidney failure Paternal Aunt   . Diabetes Father   . Hypertension Father   . Colon cancer Neg Hx   . Arthritis Brother   . Diabetes Maternal Aunt   . Hypertension Maternal Aunt   . Diabetes Paternal Aunt   . Heart disease Paternal Aunt   . Hypertension Paternal Aunt     Social History  Substance Use Topics  . Smoking status: Never Smoker   . Smokeless tobacco: Never Used  . Alcohol Use: No    Allergies:  Allergies  Allergen Reactions  . Ciprofloxacin Hives, Itching and Nausea And Vomiting  . Fluocinolone Itching, Nausea And Vomiting and Other (See Comments)  . Pineapple Itching and Other (See Comments)    Pt states that this medication makes her tongue raw.    . Strawberry Extract Itching and Other (See Comments)    Pt states that this medication makes her tongue raw.    . Tylenol [Acetaminophen] Itching  . Adhesive [Tape] Itching and Rash  . Chlorhexidine Gluconate Itching  . Clindamycin Diarrhea, Nausea And Vomiting and Rash  . Oxycodone Nausea And Vomiting, Rash and Other (See Comments)    Reaction:  Hallucinations   . Shrimp [Shellfish Allergy] Rash    Prescriptions prior to admission   Medication Sig Dispense Refill Last Dose  . acetaminophen-codeine (TYLENOL #3) 300-30 MG tablet Take 1 tablet by mouth every 8 (eight) hours as needed for moderate pain or severe pain.    Past Week at Unknown time  . albuterol (PROVENTIL HFA;VENTOLIN HFA) 108 (90 Base) MCG/ACT inhaler Inhale 2 puffs into the lungs every 4 (four) hours as needed for wheezing or shortness of breath. 1 Inhaler 2 09/08/2015 at Unknown time  . amLODipine (NORVASC) 10 MG tablet Take 10 mg by mouth daily.   09/07/2015 at Unknown time  . aspirin EC 81 MG tablet Take 81 mg by mouth daily.   09/07/2015 at  1030  . azithromycin (ZITHROMAX) 500 MG tablet Take 1 tablet (500 mg total) by mouth daily at 6 PM. 1 tablet 0   . Calcium Carbonate-Vitamin D (CALCIUM 600+D) 600-400 MG-UNIT tablet Take 1 tablet by mouth daily.   09/07/2015 at Unknown time  . cefpodoxime (VANTIN) 200 MG tablet Take 1 tablet (200 mg total) by mouth 2 (two) times daily. 3 tablet 0   . citalopram (CELEXA) 10 MG tablet Take 10 mg by mouth at bedtime.    09/07/2015 at Unknown time  . fluticasone (FLOVENT HFA) 110 MCG/ACT inhaler Inhale 2 puffs into the lungs 2 (two) times daily. 1 Inhaler 0 09/08/2015 at Unknown time  . gabapentin (NEURONTIN) 100 MG capsule Take 100-200 mg by mouth 2 (two) times daily. Pt takes one capsule in the morning and two at bedtime.   09/07/2015 at Unknown time  . HYDROcodone-homatropine (HYCODAN) 5-1.5 MG/5ML syrup Take 5 mLs by mouth every 6 (six) hours as needed for cough. 75 mL 0 Past Week at Unknown time  . hydrOXYzine (ATARAX/VISTARIL) 25 MG tablet Take 25 mg by mouth every 4 (four) hours as needed for itching.    09/07/2015 at Unknown time  . Insulin Detemir (LEVEMIR FLEXTOUCH) 100 UNIT/ML Pen Inject 22 Units into the skin every 12 (twelve) hours.    09/07/2015 at Unknown time  . labetalol (NORMODYNE) 300 MG tablet Take 300 mg by mouth 2 (two) times daily.   09/07/2015 at 2330  . linagliptin (TRADJENTA) 5 MG TABS tablet Take 5 mg by mouth at bedtime.    09/07/2015 at Unknown time  . methimazole (TAPAZOLE) 5 MG tablet Take 5 mg by mouth at bedtime.    09/07/2015 at Unknown time  . multivitamin (RENA-VIT) TABS tablet Take 1 tablet by mouth daily.   09/07/2015 at Unknown time  . omeprazole (PRILOSEC) 20 MG capsule Take 20 mg by mouth daily.   09/07/2015 at Unknown time  . sevelamer carbonate (RENVELA) 800 MG tablet Take 800-1,600 mg by mouth 5 (five) times daily. Pt takes two tablets three times daily with meals and one tablet two times daily with snacks.   09/07/2015 at Unknown time  . simvastatin (ZOCOR) 5 MG tablet Take 5 mg by mouth at bedtime.   09/07/2015 at Unknown time  . topiramate (TOPAMAX) 50 MG tablet Take 50 mg by mouth at bedtime.    09/07/2015 at Unknown time    Review of Systems  Genitourinary: Positive for pelvic pain.   Physical Exam   Blood pressure 191/103, pulse 84, temperature 98 F (36.7 C), temperature source Oral, resp. rate 18, height 5\' 6"  (1.676 m), weight 94.008 kg (207 lb 4 oz), last menstrual period 08/09/2015.  Physical Exam  Nursing note and vitals reviewed. Constitutional: She is oriented to person, place, and time. She appears well-developed and well-nourished. No distress.  HENT:  Head: Normocephalic.  Cardiovascular: Normal rate.   Respiratory: Effort normal.  GI: Soft. There is no tenderness.  Neurological: She is alert and oriented to person, place, and time.  Skin: Skin is warm and dry.  Psychiatric: She has a normal mood and affect.     Results for orders placed or performed during the hospital encounter of 10/09/15 (from the past 24 hour(s))  Urinalysis, Routine w reflex microscopic (not at [facility])     Status: Abnormal   Collection Time: 10/09/15  1:46 AM  Result Value Ref Range   Color, Urine YELLOW YELLOW   APPearance CLEAR CLEAR   Specific Gravity, Urine  1.020 1.005 - 1.030   pH 8.0 5.0 - 8.0   Glucose, UA 250 (A) NEGATIVE mg/dL   Hgb urine dipstick SMALL (A) NEGATIVE   Bilirubin Urine NEGATIVE  NEGATIVE   Ketones, ur NEGATIVE NEGATIVE mg/dL   Protein, ur >300 (A) NEGATIVE mg/dL   Nitrite NEGATIVE NEGATIVE   Leukocytes, UA NEGATIVE NEGATIVE  Urine microscopic-add on     Status: Abnormal   Collection Time: 10/09/15  1:46 AM  Result Value Ref Range   Squamous Epithelial / LPF 0-5 (A) NONE SEEN   WBC, UA 0-5 0 - 5 WBC/hpf   RBC / HPF 6-30 0 - 5 RBC/hpf   Bacteria, UA FEW (A) NONE SEEN  CBC     Status: Abnormal   Collection Time: 10/09/15  2:16 AM  Result Value Ref Range   WBC 6.2 4.0 - 10.5 K/uL   RBC 3.97 3.87 - 5.11 MIL/uL   Hemoglobin 10.9 (L) 12.0 - 15.0 g/dL   HCT 33.1 (L) 36.0 - 46.0 %   MCV 83.4 78.0 - 100.0 fL   MCH 27.5 26.0 - 34.0 pg   MCHC 32.9 30.0 - 36.0 g/dL   RDW 15.7 (H) 11.5 - 15.5 %   Platelets 244 150 - 400 K/uL  hCG, quantitative, pregnancy     Status: None   Collection Time: 10/09/15  2:16 AM  Result Value Ref Range   hCG, Beta Chain, Quant, S 2 <5 mIU/mL  Wet prep, genital     Status: Abnormal   Collection Time: 10/09/15  2:37 AM  Result Value Ref Range   Yeast Wet Prep HPF POC NONE SEEN NONE SEEN   Trich, Wet Prep NONE SEEN NONE SEEN   Clue Cells Wet Prep HPF POC PRESENT (A) NONE SEEN   WBC, Wet Prep HPF POC MODERATE (A) NONE SEEN   Sperm NONE SEEN     MAU Course  Procedures  MDM Patient has been given her missed blood pressure medications.  HCG is 2 today. Uncertain if this is of any significance. Given +UPT, followed by bleeding and now HCG 2 will repeat HCG in the clinic on Monday. Patient has had a tubal ligation so close follow up is warranted to be certain this pregnancy is not progressing.   Assessment and Plan  Possible pregnancy  DC home Comfort measures reviewed  1st Trimester precautions  Bleeding precautions Ectopic precautions RX: none  Return to MAU as needed FU with OB as planned  Follow-up Information    Follow up with [facility].   Specialty:  Obstetrics and Gynecology   Why:  Monday  10/13/15 for blood work    Contact information:   [place] [phone]        Mathis Bud 10/09/2015, 2:31 AM

## 2015-10-09 NOTE — Discharge Instructions (Signed)

## 2015-10-13 ENCOUNTER — Other Ambulatory Visit: Payer: Medicare Other

## 2015-10-15 ENCOUNTER — Other Ambulatory Visit: Payer: Medicare Other

## 2015-10-15 ENCOUNTER — Encounter: Payer: Self-pay | Admitting: Family Medicine

## 2015-10-15 DIAGNOSIS — E349 Endocrine disorder, unspecified: Secondary | ICD-10-CM

## 2015-10-15 DIAGNOSIS — Z3200 Encounter for pregnancy test, result unknown: Secondary | ICD-10-CM

## 2015-10-15 DIAGNOSIS — Z3490 Encounter for supervision of normal pregnancy, unspecified, unspecified trimester: Secondary | ICD-10-CM

## 2015-10-15 DIAGNOSIS — O0281 Inappropriate change in quantitative human chorionic gonadotropin (hCG) in early pregnancy: Secondary | ICD-10-CM

## 2015-10-15 LAB — HCG, QUANTITATIVE, PREGNANCY: HCG, BETA CHAIN, QUANT, S: 2 m[IU]/mL (ref <5)

## 2015-10-15 NOTE — Progress Notes (Signed)
Patient here for stat bhcg today. Reports dull pain on side.

## 2015-10-15 NOTE — Patient Instructions (Signed)
Patient presented today for quant level recheck. Pt is currently at two now there is no need to follow up at this time with another quant level. Patient was advised that she could resume trying to get pregnant after two months.

## 2015-12-19 ENCOUNTER — Other Ambulatory Visit: Payer: Self-pay | Admitting: Orthopedic Surgery

## 2015-12-19 DIAGNOSIS — M25562 Pain in left knee: Secondary | ICD-10-CM

## 2015-12-26 ENCOUNTER — Other Ambulatory Visit: Payer: Self-pay | Admitting: Internal Medicine

## 2016-01-07 ENCOUNTER — Ambulatory Visit (INDEPENDENT_AMBULATORY_CARE_PROVIDER_SITE_OTHER): Payer: Medicare Other | Admitting: Orthopedic Surgery

## 2016-01-16 ENCOUNTER — Other Ambulatory Visit: Payer: Self-pay

## 2016-01-19 ENCOUNTER — Ambulatory Visit (INDEPENDENT_AMBULATORY_CARE_PROVIDER_SITE_OTHER): Payer: Medicare Other | Admitting: Orthopedic Surgery

## 2016-02-02 ENCOUNTER — Inpatient Hospital Stay: Admission: RE | Admit: 2016-02-02 | Payer: Self-pay | Source: Ambulatory Visit

## 2016-02-11 ENCOUNTER — Ambulatory Visit: Payer: Medicare Other | Admitting: Podiatry

## 2016-02-25 ENCOUNTER — Other Ambulatory Visit (INDEPENDENT_AMBULATORY_CARE_PROVIDER_SITE_OTHER): Payer: Self-pay | Admitting: Vascular Surgery

## 2016-02-25 ENCOUNTER — Encounter (INDEPENDENT_AMBULATORY_CARE_PROVIDER_SITE_OTHER): Payer: Self-pay

## 2016-03-01 ENCOUNTER — Encounter (INDEPENDENT_AMBULATORY_CARE_PROVIDER_SITE_OTHER): Payer: Self-pay

## 2016-03-01 MED ORDER — DEXTROSE 5 % IV SOLN
1.5000 g | INTRAVENOUS | Status: AC
  Administered 2016-03-08: 1.5 g via INTRAVENOUS

## 2016-03-03 ENCOUNTER — Ambulatory Visit (INDEPENDENT_AMBULATORY_CARE_PROVIDER_SITE_OTHER): Payer: Medicare Other | Admitting: Physician Assistant

## 2016-03-03 ENCOUNTER — Other Ambulatory Visit: Payer: Medicare Other

## 2016-03-03 ENCOUNTER — Encounter: Payer: Self-pay | Admitting: Physician Assistant

## 2016-03-03 VITALS — BP 134/80 | HR 94 | Ht 66.0 in | Wt 207.2 lb

## 2016-03-03 DIAGNOSIS — R110 Nausea: Secondary | ICD-10-CM | POA: Diagnosis not present

## 2016-03-03 DIAGNOSIS — K219 Gastro-esophageal reflux disease without esophagitis: Secondary | ICD-10-CM | POA: Diagnosis not present

## 2016-03-03 DIAGNOSIS — R1084 Generalized abdominal pain: Secondary | ICD-10-CM

## 2016-03-03 DIAGNOSIS — R197 Diarrhea, unspecified: Secondary | ICD-10-CM | POA: Diagnosis not present

## 2016-03-03 NOTE — Progress Notes (Signed)
Chief Complaint: Diarrhea  HPI:   Linda Ellison is a 40 year old African-American female with diabetes and chronic renal disease on dialysis, she is known to Dr. Carlean Purl and presents to clinic today with a chief complaint of diarrhea.   Today, the patient tells me that around 3-4 weeks ago she ran out of her Omeprazole 20 mg which she was taking daily. She tells me that around this time she started with diarrhea. This has worsened over the past couple of weeks to the point where she has more than 10 loose bowel movements per day. Nothing seems to make this better or worse. She has been taking Imodium per package instructions with no benefit. She does tell me that she works at the [employer] around many patients a day and also goes to hemodialysis multiple times a week. She is unaware if she has been in contact with anyone sick. She does tell me she has lost some weight over this time. Recently she has started with a "burning that feels like fire" in her abdomen and her bottom after having a bowel movement. She does tell me that this feeling sometimes awakens her from her sleep and she has to go to the bathroom. She has also been somewhat nauseous over this time. She did restart her Omeprazole 20 mg a couple of weeks ago but saw no change in her bowel habits. She denies previous workup for this, she was told by the hemodialysis center to drink more water than usual so that she does not become dehydrated. Patient tells me she has a decreased appetite because she "doesn't want have diarrhea".    Patient denies fever, chills, blood in her stool, melena, anorexia, vomiting, dysphagia, changes in medication or recent antibiotics.  Past Medical History:  Diagnosis Date  . Adenoma   . Anginal pain ([place])   . ASCVD (arteriosclerotic cardiovascular disease)   . Asthma   . CAD (coronary artery disease)    a. False positive stress echo 06/2012 at [facility] - cath with mild nonobstructive CAD at [facility] (mild  luminal irregularities in LAD, moderate diffuse disease in distal RCA).  . CHF (congestive heart failure) ([place])   . Chronic chest pain    a. H/o longstanding atypical CP. Nonobstructive cath 06/2012 and negative VQ.  Marland Kitchen Depression   . Diabetes mellitus   . Esophageal erosions    a. Per EGD 10/2011.  Marland Kitchen ESRD on hemodialysis ([place])    started dialysis 01/29/11  . Gastroparesis   . GERD (gastroesophageal reflux disease)   . HCAP (healthcare-associated pneumonia) 07/12/2015  . History of echocardiogram    Echo 9/14: EF 55-60%, normal wall motion, normal diastolic function, PASP 21  . Hyperlipidemia   . Hyperparathyroidism, secondary ([place])   . Hypertension    a. Also with hx of hypotension - has been started on midodrine.  . Iron deficiency anemia   . Loss of appetite   . Peripheral neuropathy ([place])   . Peripheral vascular disease ([place])   . PONV (postoperative nausea and vomiting)   . Sciatica   . Shortness of breath 07/12/2015  . Weight loss     Past Surgical History:  Procedure Laterality Date  . AV FISTULA PLACEMENT  08/2010   Left radiocephalic AVF  . AV FISTULA PLACEMENT Right 05/07/2014   Procedure: ARTERIOVENOUS (AV) FISTULA CREATION;  Surgeon: Elam Dutch, MD;  Location: [place];  Service: Vascular;  Laterality: Right;  . AV FISTULA PLACEMENT Right 07/02/2014   Procedure: INSERTION  OF ARTERIOVENOUS (AV) GORE-TEX GRAFT ARM;  Surgeon: Elam Dutch, MD;  Location: [place];  Service: Vascular;  Laterality: Right;  . CESAREAN SECTION    . CHOLECYSTECTOMY  1996  . COLONOSCOPY    . DILATION AND CURETTAGE OF UTERUS    . ESOPHAGOGASTRODUODENOSCOPY  10/19/2011   Dr. Silvano Rusk  . FOOT SURGERY Left   . FRACTURE SURGERY Left    left femur  . LEFT HEART CATHETERIZATION WITH CORONARY ANGIOGRAM N/A 06/27/2012   Procedure: LEFT HEART CATHETERIZATION WITH CORONARY ANGIOGRAM;  Surgeon: Larey Dresser, MD;  Location[facility] CATH LAB;  Service: Cardiovascular;  Laterality: N/A;  . LIGATION  GORETEX FISTULA  01/04/11   Left AVF  . LIGATION OF ARTERIOVENOUS  FISTULA Left 08/21/2014   Procedure: LIGATION OF ARTERIOVENOUS  FISTULA;  Surgeon: Algernon Huxley, MD;  Location: [facility] ORS;  Service: Vascular;  Laterality: Left;  . PERIPHERAL VASCULAR CATHETERIZATION Left 08/08/2014   Procedure: Upper Extremity Angiography;  Surgeon: Algernon Huxley, MD;  Location: [place] CV LAB;  Service: Cardiovascular;  Laterality: Left;  . PERIPHERAL VASCULAR CATHETERIZATION Left 08/08/2014   Procedure: Upper Extremity Intervention;  Surgeon: Algernon Huxley, MD;  Location: [place] CV LAB;  Service: Cardiovascular;  Laterality: Left;  . THROMBECTOMY AND REVISION OF ARTERIOVENTOUS (AV) GORETEX  GRAFT Right 07/16/2014   Procedure: THROMBECTOMY  Right  arm  ARTERIOVENOUS  GORETEX  GRAFT;  Surgeon: Elam Dutch, MD;  Location: [place];  Service: Vascular;  Laterality: Right;  . TUBAL LIGATION      Current Outpatient Prescriptions  Medication Sig Dispense Refill  . acetaminophen-codeine (TYLENOL #3) 300-30 MG tablet Take 1 tablet by mouth every 8 (eight) hours as needed for moderate pain or severe pain.     Marland Kitchen albuterol (PROVENTIL HFA;VENTOLIN HFA) 108 (90 Base) MCG/ACT inhaler Inhale 2 puffs into the lungs every 4 (four) hours as needed for wheezing or shortness of breath. 1 Inhaler 2  . amLODipine (NORVASC) 10 MG tablet Take 10 mg by mouth daily.    Marland Kitchen aspirin EC 81 MG tablet Take 81 mg by mouth daily.    Marland Kitchen azithromycin (ZITHROMAX) 500 MG tablet Take 1 tablet (500 mg total) by mouth daily at 6 PM. 1 tablet 0  . Calcium Carbonate-Vitamin D (CALCIUM 600+D) 600-400 MG-UNIT tablet Take 1 tablet by mouth daily.    . cefpodoxime (VANTIN) 200 MG tablet Take 1 tablet (200 mg total) by mouth 2 (two) times daily. 3 tablet 0  . citalopram (CELEXA) 10 MG tablet Take 10 mg by mouth at bedtime.     . fluticasone (FLOVENT HFA) 110 MCG/ACT inhaler Inhale 2 puffs into the lungs 2 (two) times daily. 1 Inhaler 0  . gabapentin  (NEURONTIN) 100 MG capsule Take 100-200 mg by mouth 2 (two) times daily. Pt takes one capsule in the morning and two at bedtime.    Marland Kitchen HYDROcodone-homatropine (HYCODAN) 5-1.5 MG/5ML syrup Take 5 mLs by mouth every 6 (six) hours as needed for cough. 75 mL 0  . hydrOXYzine (ATARAX/VISTARIL) 25 MG tablet Take 25 mg by mouth every 4 (four) hours as needed for itching.     . Insulin Detemir (LEVEMIR FLEXTOUCH) 100 UNIT/ML Pen Inject 22 Units into the skin every 12 (twelve) hours.     Marland Kitchen labetalol (NORMODYNE) 300 MG tablet Take 300 mg by mouth 2 (two) times daily.    Marland Kitchen linagliptin (TRADJENTA) 5 MG TABS tablet Take 5 mg by mouth at bedtime.    Marland Kitchen  methimazole (TAPAZOLE) 5 MG tablet Take 5 mg by mouth at bedtime.     . multivitamin (RENA-VIT) TABS tablet Take 1 tablet by mouth daily.    Marland Kitchen omeprazole (PRILOSEC) 20 MG capsule Take 20 mg by mouth daily.    Marland Kitchen omeprazole (PRILOSEC) 20 MG capsule TAKE 1 CAPSULE BY MOUTH EVERY DAY 30 capsule 0  . sevelamer carbonate (RENVELA) 800 MG tablet Take 800-1,600 mg by mouth 5 (five) times daily. Pt takes two tablets three times daily with meals and one tablet two times daily with snacks.    . simvastatin (ZOCOR) 5 MG tablet Take 5 mg by mouth at bedtime.    . topiramate (TOPAMAX) 50 MG tablet Take 50 mg by mouth at bedtime.      No current facility-administered medications for this visit.    Facility-Administered Medications Ordered in Other Visits  Medication Dose Route Frequency Provider Last Rate Last Dose  . cefUROXime (ZINACEF) 1.5 g in dextrose 5 % 50 mL IVPB  1.5 g Intravenous 30 min Pre-Op Kimberly A Stegmayer, PA-C        Allergies as of 03/03/2016 - Review Complete 03/03/2016  Allergen Reaction Noted  . Ciprofloxacin Hives, Itching, and Nausea And Vomiting 06/09/2011  . Fluocinolone Itching, Nausea And Vomiting, and Other (See Comments) 01/25/2014  . Pineapple Itching and Other (See Comments) 12/21/2012  . Strawberry extract Itching and Other (See Comments)  12/21/2012  . Tylenol [acetaminophen] Itching 08/08/2015  . Adhesive [tape] Itching and Rash 05/04/2014  . Chlorhexidine gluconate Itching 07/02/2014  . Clindamycin Diarrhea, Nausea And Vomiting, and Rash 07/01/2014  . Oxycodone Nausea And Vomiting, Rash, and Other (See Comments) 05/09/2014  . Shrimp [shellfish allergy] Rash 08/08/2014    Family History  Problem Relation Age of Onset  . Heart disease Mother     heart attack at 71 y/o-infected valve  . Kidney disease Mother     was a dialysis patient  . Heart disease Brother   . Kidney disease Maternal Grandmother     pre-dialysis  . Diabetes Father   . Hypertension Father   . Kidney failure Paternal Uncle   . Kidney failure Paternal Aunt   . Arthritis Brother   . Diabetes Maternal Aunt   . Hypertension Maternal Aunt   . Diabetes Paternal Aunt   . Heart disease Paternal Aunt   . Hypertension Paternal Aunt   . Colon cancer Neg Hx     Social History   Social History  . Marital status: Single    Spouse name: N/A  . Number of children: 2  . Years of education: N/A   Occupational History  . Unemployed    Social History Main Topics  . Smoking status: Never Smoker  . Smokeless tobacco: Never Used  . Alcohol use No  . Drug use: No  . Sexual activity: Not on file     Comment: BTL   Other Topics Concern  . Not on file   Social History Narrative   Disabled 2/2 to dialysis   Used to be a Consulting civil engineer at [employer]   Has 2 kids   Lives in [place]   No caffeine     Review of Systems:     Constitutional: Positive for weight loss No fever, chills, weakness or fatigue Cardiovascular: No chest pain Respiratory: No SOB  Gastrointestinal: See HPI and otherwise negative   Physical Exam:  Vital signs: BP 134/80   Pulse 94   Ht 5\' 6"  (1.676 m)  Wt 207 lb 3.2 oz (94 kg)   SpO2 98%   BMI 33.44 kg/m   Constitutional:   Pleasant African-American female appears to be in NAD, Well developed, Well nourished,  alert and cooperative Respiratory: Respirations even and unlabored. Lungs clear to auscultation bilaterally.   No wheezes, crackles, or rhonchi.  Cardiovascular: Normal S1, S2. No MRG. Regular rate and rhythm. No peripheral edema, cyanosis or pallor.  Gastrointestinal:  Soft, nondistended, nontender. No rebound or guarding. Normal bowel sounds. No appreciable masses or hepatomegaly. Rectal:  Not performed.  Psychiatric: Demonstrates good judgement and reason without abnormal affect or behaviors.  MOST RECENT LABS: CBC    Component Value Date/Time   WBC 6.2 10/09/2015 0216   RBC 3.97 10/09/2015 0216   HGB 10.9 (L) 10/09/2015 0216   HCT 33.1 (L) 10/09/2015 0216   PLT 244 10/09/2015 0216   MCV 83.4 10/09/2015 0216   MCH 27.5 10/09/2015 0216   MCHC 32.9 10/09/2015 0216   RDW 15.7 (H) 10/09/2015 0216   LYMPHSABS 1.9 09/01/2015 0541   MONOABS 0.7 09/01/2015 0541   EOSABS 0.1 09/01/2015 0541   BASOSABS 0.0 09/01/2015 0541    CMP     Component Value Date/Time   NA 137 09/11/2015 0745   K 4.3 09/11/2015 0745   CL 103 09/11/2015 0745   CO2 24 09/11/2015 0745   GLUCOSE 119 (H) 09/11/2015 0745   BUN 35 (H) 09/11/2015 0745   CREATININE 9.67 (H) 09/11/2015 0745   CALCIUM 8.2 (L) 09/11/2015 0745   PROT 6.3 (L) 09/01/2015 0541   ALBUMIN 2.7 (L) 09/11/2015 0745   AST 12 (L) 09/01/2015 0541   ALT 14 09/01/2015 0541   ALKPHOS 54 09/01/2015 0541   BILITOT 0.7 09/01/2015 0541   GFRNONAA 5 (L) 09/11/2015 0745   GFRAA 5 (L) 09/11/2015 0745    Assessment: 1. Diarrhea: Started 3 weeks ago, too numerous to count bowel movements per day, liquid in consistency, patient does have history of working in [employer] as well as having hemodialysis multiple times a week, concern for C. difficile versus other infectious cause 2. Abdominal pain: With above 3. Nausea: Likely related to above as this has worsened with increase in diarrhea, though could have an element of gastritis versus  other, if continues past diarrhea symptoms, could trial increase in PPI therapy 4. GERD: Reflux and heartburn symptoms are controlled on Omeprazole 20 mg daily  Plan: 1. Ordered stool studies including a GI pathogen panel, C. difficile and ova and parasites. We will direct patient's care after results. If nothing infectious could try Lomotil. 2. Recommend the patient remain well hydrated. 3. Recommend patient continue her Omeprazole 20 mg daily 4. Did discuss with patient that if the stool studies are negative and symptoms continue, we may need to do a colonoscopy for further evaluation. She verbalized agreement. 5. Patient to follow in clinic in 2-3 weeks with Dr. Carlean Purl or me  Ellouise Newer, PA-C [facility] 03/03/2016, 9:24 AM  Cc: Marijean Bravo, MD

## 2016-03-03 NOTE — Patient Instructions (Signed)
Your physician has requested that you go to the basement for  lab work before leaving today.   

## 2016-03-08 ENCOUNTER — Ambulatory Visit
Admission: RE | Admit: 2016-03-08 | Discharge: 2016-03-08 | Disposition: A | Discharge status: Home / Self Care | Payer: Medicare Other | Source: Ambulatory Visit | Attending: Vascular Surgery | Admitting: Vascular Surgery

## 2016-03-08 ENCOUNTER — Encounter: Payer: Self-pay | Admitting: *Deleted

## 2016-03-08 ENCOUNTER — Encounter
Admission: RE | Disposition: A | Discharge status: Home / Self Care | Payer: Self-pay | Source: Ambulatory Visit | Attending: Vascular Surgery

## 2016-03-08 DIAGNOSIS — I120 Hypertensive chronic kidney disease with stage 5 chronic kidney disease or end stage renal disease: Secondary | ICD-10-CM | POA: Insufficient documentation

## 2016-03-08 DIAGNOSIS — Z800 Family history of malignant neoplasm of digestive organs: Secondary | ICD-10-CM | POA: Insufficient documentation

## 2016-03-08 DIAGNOSIS — Z8261 Family history of arthritis: Secondary | ICD-10-CM | POA: Insufficient documentation

## 2016-03-08 DIAGNOSIS — Z8249 Family history of ischemic heart disease and other diseases of the circulatory system: Secondary | ICD-10-CM | POA: Diagnosis not present

## 2016-03-08 DIAGNOSIS — Z888 Allergy status to other drugs, medicaments and biological substances status: Secondary | ICD-10-CM | POA: Insufficient documentation

## 2016-03-08 DIAGNOSIS — Z7982 Long term (current) use of aspirin: Secondary | ICD-10-CM | POA: Insufficient documentation

## 2016-03-08 DIAGNOSIS — Z9109 Other allergy status, other than to drugs and biological substances: Secondary | ICD-10-CM | POA: Diagnosis not present

## 2016-03-08 DIAGNOSIS — Z833 Family history of diabetes mellitus: Secondary | ICD-10-CM | POA: Diagnosis not present

## 2016-03-08 DIAGNOSIS — Z886 Allergy status to analgesic agent status: Secondary | ICD-10-CM | POA: Insufficient documentation

## 2016-03-08 DIAGNOSIS — T82848A Pain from vascular prosthetic devices, implants and grafts, initial encounter: Secondary | ICD-10-CM | POA: Diagnosis present

## 2016-03-08 DIAGNOSIS — Z992 Dependence on renal dialysis: Secondary | ICD-10-CM | POA: Insufficient documentation

## 2016-03-08 DIAGNOSIS — E785 Hyperlipidemia, unspecified: Secondary | ICD-10-CM | POA: Insufficient documentation

## 2016-03-08 DIAGNOSIS — K219 Gastro-esophageal reflux disease without esophagitis: Secondary | ICD-10-CM | POA: Insufficient documentation

## 2016-03-08 DIAGNOSIS — Z794 Long term (current) use of insulin: Secondary | ICD-10-CM | POA: Insufficient documentation

## 2016-03-08 DIAGNOSIS — N2581 Secondary hyperparathyroidism of renal origin: Secondary | ICD-10-CM | POA: Insufficient documentation

## 2016-03-08 DIAGNOSIS — Z91013 Allergy to seafood: Secondary | ICD-10-CM | POA: Diagnosis not present

## 2016-03-08 DIAGNOSIS — Z841 Family history of disorders of kidney and ureter: Secondary | ICD-10-CM | POA: Insufficient documentation

## 2016-03-08 DIAGNOSIS — E1122 Type 2 diabetes mellitus with diabetic chronic kidney disease: Secondary | ICD-10-CM | POA: Insufficient documentation

## 2016-03-08 DIAGNOSIS — G629 Polyneuropathy, unspecified: Secondary | ICD-10-CM | POA: Insufficient documentation

## 2016-03-08 DIAGNOSIS — Z881 Allergy status to other antibiotic agents status: Secondary | ICD-10-CM | POA: Insufficient documentation

## 2016-03-08 DIAGNOSIS — Y832 Surgical operation with anastomosis, bypass or graft as the cause of abnormal reaction of the patient, or of later complication, without mention of misadventure at the time of the procedure: Secondary | ICD-10-CM | POA: Diagnosis not present

## 2016-03-08 DIAGNOSIS — Z9049 Acquired absence of other specified parts of digestive tract: Secondary | ICD-10-CM | POA: Diagnosis not present

## 2016-03-08 DIAGNOSIS — Z91018 Allergy to other foods: Secondary | ICD-10-CM | POA: Insufficient documentation

## 2016-03-08 DIAGNOSIS — N186 End stage renal disease: Secondary | ICD-10-CM

## 2016-03-08 DIAGNOSIS — I2510 Atherosclerotic heart disease of native coronary artery without angina pectoris: Secondary | ICD-10-CM | POA: Insufficient documentation

## 2016-03-08 DIAGNOSIS — Z885 Allergy status to narcotic agent status: Secondary | ICD-10-CM | POA: Diagnosis not present

## 2016-03-08 DIAGNOSIS — T82868A Thrombosis of vascular prosthetic devices, implants and grafts, initial encounter: Secondary | ICD-10-CM

## 2016-03-08 HISTORY — PX: PERIPHERAL VASCULAR CATHETERIZATION: SHX172C

## 2016-03-08 LAB — POTASSIUM (ARMC VASCULAR LAB ONLY): POTASSIUM (ARMC VASCULAR LAB): 5.2 — AB (ref 3.5–5.1)

## 2016-03-08 SURGERY — A/V FISTULAGRAM
Anesthesia: Moderate Sedation | Laterality: Left

## 2016-03-08 MED ORDER — METHYLPREDNISOLONE SODIUM SUCC 125 MG IJ SOLR
125.0000 mg | INTRAMUSCULAR | Status: DC | PRN
  Administered 2016-03-08: 125 mg via INTRAVENOUS

## 2016-03-08 MED ORDER — HEPARIN (PORCINE) IN NACL 2-0.9 UNIT/ML-% IJ SOLN
INTRAMUSCULAR | Status: AC
  Filled 2016-03-08: qty 1000

## 2016-03-08 MED ORDER — HEPARIN SODIUM (PORCINE) 1000 UNIT/ML IJ SOLN
INTRAMUSCULAR | Status: AC
  Filled 2016-03-08: qty 1

## 2016-03-08 MED ORDER — MIDAZOLAM HCL 2 MG/2ML IJ SOLN
INTRAMUSCULAR | Status: AC
  Filled 2016-03-08: qty 4

## 2016-03-08 MED ORDER — LIDOCAINE-EPINEPHRINE 1 %-1:100000 IJ SOLN
INTRAMUSCULAR | Status: AC
  Filled 2016-03-08: qty 1

## 2016-03-08 MED ORDER — FAMOTIDINE 20 MG PO TABS
ORAL_TABLET | ORAL | Status: AC
  Filled 2016-03-08: qty 1

## 2016-03-08 MED ORDER — METHYLPREDNISOLONE SODIUM SUCC 125 MG IJ SOLR
INTRAMUSCULAR | Status: AC
  Filled 2016-03-08: qty 2

## 2016-03-08 MED ORDER — FAMOTIDINE 20 MG PO TABS
40.0000 mg | ORAL_TABLET | ORAL | Status: DC | PRN
  Administered 2016-03-08: 40 mg via ORAL

## 2016-03-08 MED ORDER — HYDROMORPHONE HCL 1 MG/ML IJ SOLN
1.0000 mg | Freq: Once | INTRAMUSCULAR | Status: DC
Start: 2016-03-08 — End: 2016-03-08

## 2016-03-08 MED ORDER — MIDAZOLAM HCL 2 MG/2ML IJ SOLN
INTRAMUSCULAR | Status: DC | PRN
  Administered 2016-03-08: 2 mg via INTRAVENOUS

## 2016-03-08 MED ORDER — FENTANYL CITRATE (PF) 100 MCG/2ML IJ SOLN
INTRAMUSCULAR | Status: DC | PRN
  Administered 2016-03-08: 50 ug via INTRAVENOUS

## 2016-03-08 MED ORDER — IOPAMIDOL (ISOVUE-300) INJECTION 61%
INTRAVENOUS | Status: DC | PRN
  Administered 2016-03-08: 20 mL via INTRA_ARTERIAL

## 2016-03-08 MED ORDER — FAMOTIDINE 20 MG PO TABS
ORAL_TABLET | ORAL | Status: AC
Start: 2016-03-08 — End: 2016-03-08
  Administered 2016-03-08: 40 mg via ORAL
  Filled 2016-03-08: qty 1

## 2016-03-08 MED ORDER — FENTANYL CITRATE (PF) 100 MCG/2ML IJ SOLN
INTRAMUSCULAR | Status: AC
  Filled 2016-03-08: qty 2

## 2016-03-08 MED ORDER — ONDANSETRON HCL 4 MG/2ML IJ SOLN: 4.0000 mg | Freq: Four times a day (QID) | INTRAMUSCULAR | Status: DC | PRN

## 2016-03-08 MED ORDER — SODIUM CHLORIDE 0.9 % IV SOLN: INTRAVENOUS | Status: DC

## 2016-03-08 SURGICAL SUPPLY — 5 items
CANNULA 5F STIFF (CANNULA) ×3 IMPLANT
DRAPE BRACHIAL (DRAPES) ×3 IMPLANT
PACK ANGIOGRAPHY (CUSTOM PROCEDURE TRAY) ×3 IMPLANT
SHEATH BRITE TIP 6FRX5.5 (SHEATH) ×3 IMPLANT
TOWEL OR 17X26 4PK STRL BLUE (TOWEL DISPOSABLE) ×3 IMPLANT

## 2016-03-08 NOTE — H&P (Signed)
[facility] History & Physical Update  The patient was interviewed and re-examined.  The patient's previous History and Physical has been reviewed and is unchanged.  There is no change in the plan of care. We plan to proceed with the scheduled procedure.  Leotis Pain, MD  03/08/2016, 11:11 AM

## 2016-03-08 NOTE — Op Note (Signed)
[facility]    OPERATIVE NOTE   PROCEDURE: 1.   Left radiocephalic arteriovenous fistula cannulation under ultrasound guidance 2.   Left arm fistulagram including central venogram   PRE-OPERATIVE DIAGNOSIS: 1. ESRD 2. Poorly functional left radiocephalic AVF with pain  POST-OPERATIVE DIAGNOSIS: same as above   SURGEON: Leotis Pain, MD  ANESTHESIA: local with MCS  ESTIMATED BLOOD LOSS: Minimal  FINDING(S): 1. Widely patent left radiocephalic AV fistula with no significant stenosis. This was moderately aneurysmal and mildly tortuous. There was dual outflow in the upper arm. Central venous circulation was widely patent.  SPECIMEN(S):  None  CONTRAST: 20 cc  FLUORO TIME: 0.4 minutes  MODERATE CONSCIOUS SEDATION TIME: Approximately 15 minutes with 2 mg of Versed and 50 mcg of Fentanyl   INDICATIONS: Linda Ellison is a 40 y.o. female who presents with malfunctioning left radiocephalic arteriovenous fistula. She has significant pain with dialysis. The patient is scheduled for left arm fistulagram.  The patient is aware the risks include but are not limited to: bleeding, infection, thrombosis of the cannulated access, and possible anaphylactic reaction to the contrast.  The patient is aware of the risks of the procedure and elects to proceed forward.  DESCRIPTION:  After full informed written consent was obtained, the patient was brought back to the angiography suite and placed supine upon the angiography table.  The patient was connected to monitoring equipment. Moderate conscious sedation was administered with a face to face encounter with the patient throughout the procedure with my supervision of the RN administering medicines and monitoring the patient's vital signs and mental status throughout from the start of the procedure until the patient was taken to the recovery room. The left arm was prepped and draped in the standard fashion for a percutaneous access  intervention.  Under ultrasound guidance, the left radiocephalic arteriovenous fistula was cannulated with a micropuncture needle under direct ultrasound guidance and a permanent image was performed.  The microwire was advanced into the fistula and the needle was exchanged for the a microsheath.  I then upsized to a 6 Fr Sheath and imaging was performed.  Hand injections were completed to image the access including the central venous system. This demonstrated widely patent left radiocephalic AV fistula with no significant stenosis. This was moderately aneurysmal and mildly tortuous. There was dual outflow in the upper arm. Central venous circulation was widely patent.  Based on the images, this patient will not need any percutaneous intervention for her fistula. The wire and balloon were removed from the sheath.  A 4-0 Monocryl purse-string suture was sewn around the sheath.  The sheath was removed while tying down the suture.  A sterile bandage was applied to the puncture site.  COMPLICATIONS: None  CONDITION: Stable   Leotis Pain  03/08/2016 11:41 AM   This note was created with Dragon Medical transcription system. Any errors in dictation are purely unintentional.

## 2016-03-09 ENCOUNTER — Encounter: Payer: Self-pay | Admitting: Vascular Surgery

## 2016-03-14 NOTE — Progress Notes (Signed)
So far she has not turned in any stool studies. ? If sxs have resolved.  Agree with Ms. Mort Sawyers evaluation and management.

## 2016-03-15 ENCOUNTER — Telehealth: Payer: Self-pay

## 2016-03-15 NOTE — Telephone Encounter (Signed)
-----   Message from Levin Erp, [place] sent at 03/15/2016  9:39 AM EST ----- Regarding: Can you call thanks Please call and check in with patient in regards to stool studies and symptoms? Thanks-JLL ----- Message ----- From: Gatha Mayer, MD Sent: 03/14/2016   9:34 AM To: Levin Erp, PA    ----- Message ----- From: Levin Erp, PA Sent: 03/03/2016  10:08 AM To: Gatha Mayer, MD

## 2016-03-15 NOTE — Telephone Encounter (Signed)
Pt states she has not had any diarrhea since stool test was ordered.  She states she feels fine.  Does she need to keep f/u appt on 03/17/16?  Please advise

## 2016-03-15 NOTE — Telephone Encounter (Signed)
Left message on machine to call back  

## 2016-03-16 NOTE — Telephone Encounter (Signed)
If patient is better, no need to come in for follow up. Thanks-JLL

## 2016-03-16 NOTE — Telephone Encounter (Signed)
Pt was notified and appt cancelled

## 2016-03-17 ENCOUNTER — Ambulatory Visit: Payer: Self-pay | Admitting: Physician Assistant

## 2016-04-02 ENCOUNTER — Inpatient Hospital Stay (HOSPITAL_COMMUNITY)
Admission: AD | Admit: 2016-04-02 | Discharge: 2016-04-02 | Disposition: A | Discharge status: Home / Self Care | Payer: Medicare Other | Source: Ambulatory Visit | Attending: Obstetrics and Gynecology | Admitting: Obstetrics and Gynecology

## 2016-04-02 ENCOUNTER — Encounter (HOSPITAL_COMMUNITY): Payer: Self-pay | Admitting: *Deleted

## 2016-04-02 DIAGNOSIS — Z794 Long term (current) use of insulin: Secondary | ICD-10-CM | POA: Insufficient documentation

## 2016-04-02 DIAGNOSIS — E1143 Type 2 diabetes mellitus with diabetic autonomic (poly)neuropathy: Secondary | ICD-10-CM | POA: Insufficient documentation

## 2016-04-02 DIAGNOSIS — Z7982 Long term (current) use of aspirin: Secondary | ICD-10-CM | POA: Diagnosis not present

## 2016-04-02 DIAGNOSIS — I132 Hypertensive heart and chronic kidney disease with heart failure and with stage 5 chronic kidney disease, or end stage renal disease: Secondary | ICD-10-CM | POA: Diagnosis not present

## 2016-04-02 DIAGNOSIS — R102 Pelvic and perineal pain: Secondary | ICD-10-CM | POA: Diagnosis present

## 2016-04-02 DIAGNOSIS — Z992 Dependence on renal dialysis: Secondary | ICD-10-CM | POA: Insufficient documentation

## 2016-04-02 DIAGNOSIS — I509 Heart failure, unspecified: Secondary | ICD-10-CM | POA: Insufficient documentation

## 2016-04-02 DIAGNOSIS — J45909 Unspecified asthma, uncomplicated: Secondary | ICD-10-CM | POA: Diagnosis not present

## 2016-04-02 DIAGNOSIS — E1122 Type 2 diabetes mellitus with diabetic chronic kidney disease: Secondary | ICD-10-CM | POA: Diagnosis not present

## 2016-04-02 DIAGNOSIS — E1151 Type 2 diabetes mellitus with diabetic peripheral angiopathy without gangrene: Secondary | ICD-10-CM | POA: Diagnosis not present

## 2016-04-02 DIAGNOSIS — Z9889 Other specified postprocedural states: Secondary | ICD-10-CM

## 2016-04-02 LAB — CBC
HEMATOCRIT: 32.2 % — AB (ref 36.0–46.0)
HEMOGLOBIN: 10.2 g/dL — AB (ref 12.0–15.0)
MCH: 27.9 pg (ref 26.0–34.0)
MCHC: 31.7 g/dL (ref 30.0–36.0)
MCV: 88 fL (ref 78.0–100.0)
Platelets: 402 10*3/uL — ABNORMAL HIGH (ref 150–400)
RBC: 3.66 MIL/uL — AB (ref 3.87–5.11)
RDW: 17 % — ABNORMAL HIGH (ref 11.5–15.5)
WBC: 8 10*3/uL (ref 4.0–10.5)

## 2016-04-02 LAB — WET PREP, GENITAL
SPERM: NONE SEEN
TRICH WET PREP: NONE SEEN
YEAST WET PREP: NONE SEEN

## 2016-04-02 LAB — GC/CHLAMYDIA PROBE AMP (~~LOC~~) NOT AT ARMC
CHLAMYDIA, DNA PROBE: NEGATIVE
Neisseria Gonorrhea: NEGATIVE

## 2016-04-02 MED ORDER — IBUPROFEN 800 MG PO TABS: 800.0000 mg | ORAL_TABLET | Freq: Three times a day (TID) | ORAL | 0 refills | Status: DC | PRN

## 2016-04-02 MED ORDER — KETOROLAC TROMETHAMINE 60 MG/2ML IM SOLN
60.0000 mg | Freq: Once | INTRAMUSCULAR | Status: AC
  Administered 2016-04-02: 60 mg via INTRAMUSCULAR
  Filled 2016-04-02: qty 2

## 2016-04-02 NOTE — MAU Note (Addendum)
Pt  Says she had  VAG     bx  Done  On 12-27   BY  DR  RICHARDSON-      SHE HAS  BEEN IN PAIN IN VAG AREA  AND LOWER ABD    AND  RIGHT  SIDE.      BLEEDING-  LIGHT .      SHE CALLED  DR  RICHARDSON-   AT   3PM-   TOLD TO COME TO ER IF COULD NOT MAKE  IT TO OFFICE.         TOOK TYLENOL XS    2 TABS -  AT  4 PM ,   9PM ,    1-2 AM  -    NO  RELIEF.  ALSO ON DIALYSIS -    TUES,  THUR,  SAT

## 2016-04-02 NOTE — Discharge Instructions (Signed)
Cervical Biopsy A cervical biopsy is a procedure to remove a small sample of tissue from the cervix. The cervix is the lowest part of the womb (uterus), which opens into the vagina (birth canal). You may have this procedure to check for cancer or growths that may become cancer. This procedure may also be done if the results of your Pap test were abnormal or if your health care provider saw an abnormality during a pelvic exam. Tell a health care provider about:  Any allergies you have.  All medicines you are taking, including vitamins, herbs, eye drops, creams, and over-the-counter medicines.  Any problems you or family members have had with anesthetic medicines.  Any blood disorders you have.  Any surgeries you have had.  Any medical conditions you have.  Whether you are pregnant or may be pregnant.  Whether you are having your menstrual period or will be having your period at the time of the procedure. What are the risks? Generally, this is a safe procedure. However, problems may occur, including:  Infection.  Bleeding.  Allergic reactions to medicines or dyes.  Damage to other structures or organs. What happens before the procedure?  Do not douche, have sex, use tampons, or use any vaginal medicines before the procedure as told by your health care provider.  Follow instructions from your health care provider about eating or drinking restrictions.  Ask your health care provider about:  Changing or stopping your regular medicines. This is especially important if you are taking diabetes medicines or blood thinners.  Taking medicines such as aspirin and ibuprofen. These medicines can thin your blood. Do not take these medicines before your procedure if your health care provider instructs you not to.  You may be given an over-the-counter pain medicine to take right before the procedure.  You may be asked to empty your bladder and bowel right before the procedure. What  happens during the procedure?  You will undress from the waist down.  You will lie on an examining table and put your feet in stirrups.  To reduce your risk of infection:  Your health care team will wash or sanitize their hands.  Your skin will be washed with soap.  Your health care provider will use a lubricated instrument (speculum) to open your vagina. An instrument that has a magnifying lens and a light (colposcope) will let your health care provider examine the cervix more closely.  You may be given a medicine to numb the area (local anesthetic).  Your health care provider will apply a solution to your cervix. This turns abnormal areas a pale color.  Your health care provider will use an instrument (biopsy forceps) to take one or more small pieces of tissue that appear abnormal.  If there seems to be an abnormal area in the part of your cervix that leads to the uterus (endocervical canal), your health care provider will use an instrument (curette) to scrape tissue from that area. This is called endocervical curettage.  Your health care provider will apply a paste over the biopsy areas to help control bleeding. The procedure may vary among health care providers and hospitals. What happens after the procedure? It is your responsibility to get the results of your procedure. Ask your health care provider or the department performing the procedure when your results will be ready. This information is not intended to replace advice given to you by your health care provider. Make sure you discuss any questions you have with your health  care provider. Document Released: 03/22/2005 Document Revised: 07/31/2015 Document Reviewed: 08/07/2014 Elsevier Interactive Patient Education  2017 [employer].

## 2016-04-02 NOTE — MAU Provider Note (Signed)
History     CSN: [id number]  Arrival date and time: 04/02/16 0117   First Provider Initiated Contact with Patient 04/02/16 0451      Chief Complaint  Patient presents with  . Pelvic Pain   Linda Ellison is a 40 y.o. [id number] who presents today with pelvic pain. She states that she had a biopsy done on 03/31/16. She reports that she was told she would only have bleeding and cramping the day of the procedure. However, the pain has continued. She has only occasionally taken tylenol. She has not tried ibuprofen    Pelvic Pain  The patient's primary symptoms include pelvic pain and vaginal bleeding. This is a new problem. The current episode started in the past 7 days. The problem occurs constantly. The problem has been unchanged. Pain severity now: 7/10  The problem affects the right side. She is not pregnant. Associated symptoms include abdominal pain and nausea. Pertinent negatives include no chills, constipation, diarrhea, dysuria, fever, frequency, urgency or vomiting. The vaginal bleeding is lighter than menses. She has been passing clots (about the size of a grape ). She has not been passing tissue. Exacerbated by: Patient had a biopsy on 03/31/16, and pain and spotting started afterward.  She has tried acetaminophen for the symptoms. The treatment provided no relief. Sexual activity: No intercourse since biopsy was done.  She uses tubal ligation for contraception. Her menstrual history has been regular (LNMP: 03/23/16 ).   Past Medical History:  Diagnosis Date  . Adenoma   . Anginal pain ([place])   . ASCVD (arteriosclerotic cardiovascular disease)   . Asthma   . CAD (coronary artery disease)    a. False positive stress echo 06/2012 at [facility] - cath with mild nonobstructive CAD at [facility] (mild luminal irregularities in LAD, moderate diffuse disease in distal RCA).  . CHF (congestive heart failure) ([place])   . Chronic chest pain    a. H/o longstanding atypical CP. Nonobstructive cath 06/2012  and negative VQ.  Marland Kitchen Depression   . Diabetes mellitus   . Esophageal erosions    a. Per EGD 10/2011.  Marland Kitchen ESRD on hemodialysis ([place])    started dialysis 01/29/11  . Gastroparesis   . GERD (gastroesophageal reflux disease)   . HCAP (healthcare-associated pneumonia) 07/12/2015  . History of echocardiogram    Echo 9/14: EF 55-60%, normal wall motion, normal diastolic function, PASP 21  . Hyperlipidemia   . Hyperparathyroidism, secondary ([place])   . Hypertension    a. Also with hx of hypotension - has been started on midodrine.  . Iron deficiency anemia   . Loss of appetite   . Peripheral neuropathy ([place])   . Peripheral vascular disease ([place])   . PONV (postoperative nausea and vomiting)   . Sciatica   . Shortness of breath 07/12/2015  . Weight loss     Past Surgical History:  Procedure Laterality Date  . AV FISTULA PLACEMENT  08/2010   Left radiocephalic AVF  . AV FISTULA PLACEMENT Right 05/07/2014   Procedure: ARTERIOVENOUS (AV) FISTULA CREATION;  Surgeon: Elam Dutch, MD;  Location: [facility] OR;  Service: Vascular;  Laterality: Right;  . AV FISTULA PLACEMENT Right 07/02/2014   Procedure: INSERTION OF ARTERIOVENOUS (AV) GORE-TEX GRAFT ARM;  Surgeon: Elam Dutch, MD;  Location: [place];  Service: Vascular;  Laterality: Right;  . CESAREAN SECTION    . CHOLECYSTECTOMY  1996  . COLONOSCOPY    . DILATION AND CURETTAGE OF UTERUS    . ESOPHAGOGASTRODUODENOSCOPY  10/19/2011   Dr. Silvano Rusk  . FOOT SURGERY Left   . FRACTURE SURGERY Left    left femur  . LEFT HEART CATHETERIZATION WITH CORONARY ANGIOGRAM N/A 06/27/2012   Procedure: LEFT HEART CATHETERIZATION WITH CORONARY ANGIOGRAM;  Surgeon: Larey Dresser, MD;  Location: [facility] CATH LAB;  Service: Cardiovascular;  Laterality: N/A;  . LIGATION GORETEX FISTULA  01/04/11   Left AVF  . LIGATION OF ARTERIOVENOUS  FISTULA Left 08/21/2014   Procedure: LIGATION OF ARTERIOVENOUS  FISTULA;  Surgeon: Algernon Huxley, MD;  Location: [facility] ORS;  Service:  Vascular;  Laterality: Left;  . PERIPHERAL VASCULAR CATHETERIZATION Left 08/08/2014   Procedure: Upper Extremity Angiography;  Surgeon: Algernon Huxley, MD;  Location: [place] CV LAB;  Service: Cardiovascular;  Laterality: Left;  . PERIPHERAL VASCULAR CATHETERIZATION Left 08/08/2014   Procedure: Upper Extremity Intervention;  Surgeon: Algernon Huxley, MD;  Location: [place] CV LAB;  Service: Cardiovascular;  Laterality: Left;  . PERIPHERAL VASCULAR CATHETERIZATION Left 03/08/2016   Procedure: A/V Fistulagram;  Surgeon: Algernon Huxley, MD;  Location: [place] CV LAB;  Service: Cardiovascular;  Laterality: Left;  . THROMBECTOMY AND REVISION OF ARTERIOVENTOUS (AV) GORETEX  GRAFT Right 07/16/2014   Procedure: THROMBECTOMY  Right  arm  ARTERIOVENOUS  GORETEX  GRAFT;  Surgeon: Elam Dutch, MD;  Location: [place];  Service: Vascular;  Laterality: Right;  . TUBAL LIGATION      Family History  Problem Relation Age of Onset  . Heart disease Mother     heart attack at 50 y/o-infected valve  . Kidney disease Mother     was a dialysis patient  . Heart disease Brother   . Kidney disease Maternal Grandmother     pre-dialysis  . Diabetes Father   . Hypertension Father   . Kidney failure Paternal Uncle   . Kidney failure Paternal Aunt   . Arthritis Brother   . Diabetes Maternal Aunt   . Hypertension Maternal Aunt   . Diabetes Paternal Aunt   . Heart disease Paternal Aunt   . Hypertension Paternal Aunt   . Colon cancer Neg Hx     Social History  Substance Use Topics  . Smoking status: Never Smoker  . Smokeless tobacco: Never Used  . Alcohol use No    Allergies:  Allergies  Allergen Reactions  . Ciprofloxacin Hives, Itching and Nausea And Vomiting  . Fluocinolone Itching, Nausea And Vomiting and Other (See Comments)  . Pineapple Itching and Other (See Comments)    Pt states that this medication makes her tongue raw.    . Strawberry Extract Itching and Other (See Comments)    Pt  states that this medication makes her tongue raw.    . Tylenol [Acetaminophen] Itching  . Adhesive [Tape] Itching and Rash  . Chlorhexidine Gluconate Itching  . Clindamycin Diarrhea, Nausea And Vomiting and Rash  . Oxycodone Nausea And Vomiting, Rash and Other (See Comments)    Reaction:  Hallucinations   . Shrimp [Shellfish Allergy] Rash    Prescriptions Prior to Admission  Medication Sig Dispense Refill Last Dose  . acetaminophen-codeine (TYLENOL #3) 300-30 MG tablet Take 1 tablet by mouth every 8 (eight) hours as needed for moderate pain or severe pain.    Past Week at Unknown time  . albuterol (PROVENTIL HFA;VENTOLIN HFA) 108 (90 Base) MCG/ACT inhaler Inhale 2 puffs into the lungs every 4 (four) hours as needed for wheezing or shortness of breath. 1 Inhaler 2 Past Month at  Unknown time  . amLODipine (NORVASC) 10 MG tablet Take 10 mg by mouth daily.   03/08/2016 at Unknown time  . aspirin EC 81 MG tablet Take 81 mg by mouth daily.   03/08/2016 at Unknown time  . azithromycin (ZITHROMAX) 500 MG tablet Take 1 tablet (500 mg total) by mouth daily at 6 PM. (Patient not taking: Reported on 03/08/2016) 1 tablet 0 Completed Course at Unknown time  . Calcium Carbonate-Vitamin D (CALCIUM 600+D) 600-400 MG-UNIT tablet Take 1 tablet by mouth daily.   03/07/2016 at Unknown time  . cefpodoxime (VANTIN) 200 MG tablet Take 1 tablet (200 mg total) by mouth 2 (two) times daily. (Patient not taking: Reported on 03/08/2016) 3 tablet 0 Completed Course at Unknown time  . citalopram (CELEXA) 10 MG tablet Take 10 mg by mouth at bedtime.    03/07/2016 at Unknown time  . fluticasone (FLOVENT HFA) 110 MCG/ACT inhaler Inhale 2 puffs into the lungs 2 (two) times daily. 1 Inhaler 0 03/07/2016 at Unknown time  . gabapentin (NEURONTIN) 100 MG capsule Take 100-200 mg by mouth 2 (two) times daily. Pt takes one capsule in the morning and two at bedtime.   03/07/2016 at Unknown time  . HYDROcodone-homatropine (HYCODAN) 5-1.5 MG/5ML  syrup Take 5 mLs by mouth every 6 (six) hours as needed for cough. 75 mL 0 Past Month at Unknown time  . hydrOXYzine (ATARAX/VISTARIL) 25 MG tablet Take 25 mg by mouth every 4 (four) hours as needed for itching.    03/07/2016 at Unknown time  . Insulin Detemir (LEVEMIR FLEXTOUCH) 100 UNIT/ML Pen Inject 22 Units into the skin every 12 (twelve) hours.    03/07/2016 at Unknown time  . labetalol (NORMODYNE) 300 MG tablet Take 300 mg by mouth 2 (two) times daily.   03/08/2016 at Unknown time  . linagliptin (TRADJENTA) 5 MG TABS tablet Take 5 mg by mouth at bedtime.   03/07/2016 at Unknown time  . methimazole (TAPAZOLE) 5 MG tablet Take 5 mg by mouth at bedtime.    03/07/2016 at Unknown time  . multivitamin (RENA-VIT) TABS tablet Take 1 tablet by mouth daily.   03/07/2016 at Unknown time  . omeprazole (PRILOSEC) 20 MG capsule Take 20 mg by mouth daily.   03/07/2016 at Unknown time  . omeprazole (PRILOSEC) 20 MG capsule TAKE 1 CAPSULE BY MOUTH EVERY DAY 30 capsule 0 Taking  . sevelamer carbonate (RENVELA) 800 MG tablet Take 800-1,600 mg by mouth 5 (five) times daily. Pt takes two tablets three times daily with meals and one tablet two times daily with snacks.   03/07/2016 at Unknown time  . simvastatin (ZOCOR) 5 MG tablet Take 5 mg by mouth at bedtime.   03/07/2016 at Unknown time  . topiramate (TOPAMAX) 50 MG tablet Take 50 mg by mouth at bedtime.    03/07/2016 at Unknown time    Review of Systems  Constitutional: Negative for chills and fever.  Gastrointestinal: Positive for abdominal pain and nausea. Negative for constipation, diarrhea and vomiting.  Genitourinary: Positive for pelvic pain. Negative for dysuria, frequency and urgency.   Physical Exam   Blood pressure 175/97, pulse 92, temperature 98.4 F (36.9 C), temperature source Oral, resp. rate 20, height 5' 6.5" (1.689 m), weight 210 lb 8 oz (95.5 kg), last menstrual period 03/23/2016.  Physical Exam  Nursing note and vitals  reviewed. Constitutional: She is oriented to person, place, and time. She appears well-developed and well-nourished. No distress.  HENT:  Head: Normocephalic.  Cardiovascular: Normal  rate.   Respiratory: Effort normal.  GI: Soft. There is no tenderness. There is no rebound.  Neurological: She is alert and oriented to person, place, and time.  Skin: Skin is warm and dry.  Psychiatric: She has a normal mood and affect.   Results for orders placed or performed during the hospital encounter of 04/02/16 (from the past 24 hour(s))  Wet prep, genital     Status: Abnormal   Collection Time: 04/02/16  5:00 AM  Result Value Ref Range   Yeast Wet Prep HPF POC NONE SEEN NONE SEEN   Trich, Wet Prep NONE SEEN NONE SEEN   Clue Cells Wet Prep HPF POC PRESENT (A) NONE SEEN   WBC, Wet Prep HPF POC MODERATE (A) NONE SEEN   Sperm NONE SEEN   CBC     Status: Abnormal   Collection Time: 04/02/16  5:07 AM  Result Value Ref Range   WBC 8.0 4.0 - 10.5 K/uL   RBC 3.66 (L) 3.87 - 5.11 MIL/uL   Hemoglobin 10.2 (L) 12.0 - 15.0 g/dL   HCT 32.2 (L) 36.0 - 46.0 %   MCV 88.0 78.0 - 100.0 fL   MCH 27.9 26.0 - 34.0 pg   MCHC 31.7 30.0 - 36.0 g/dL   RDW 17.0 (H) 11.5 - 15.5 %   Platelets 402 (H) 150 - 400 K/uL    MAU Course  Procedures  MDM Patient has had toradol, and she reports that her pain is better.  Reviewed normal healing after colposcopy, and that it can take more than one day for all bleeding and cramping to be resolved.  Assessment and Plan   1. Pelvic pain   2. Status post colposcopy    DC home Comfort measures reviewed  RX: ibuprofen PRN #[id number]  Return to MAU as needed FU with OB as planned  Follow-up Information    Logan Bores, MD Follow up.   Specialty:  Obstetrics and Gynecology Contact information: [address] [place] [address] [phone]            Mathis Bud 04/02/2016, 4:53 AM

## 2016-04-07 ENCOUNTER — Emergency Department (HOSPITAL_BASED_OUTPATIENT_CLINIC_OR_DEPARTMENT_OTHER)
Admission: EM | Admit: 2016-04-07 | Discharge: 2016-04-07 | Disposition: A | Discharge status: Home / Self Care | Payer: Medicare Other | Attending: Emergency Medicine | Admitting: Emergency Medicine

## 2016-04-07 ENCOUNTER — Encounter (HOSPITAL_BASED_OUTPATIENT_CLINIC_OR_DEPARTMENT_OTHER): Payer: Self-pay | Admitting: Emergency Medicine

## 2016-04-07 ENCOUNTER — Emergency Department (HOSPITAL_BASED_OUTPATIENT_CLINIC_OR_DEPARTMENT_OTHER): Payer: Medicare Other

## 2016-04-07 DIAGNOSIS — N186 End stage renal disease: Secondary | ICD-10-CM | POA: Diagnosis not present

## 2016-04-07 DIAGNOSIS — I132 Hypertensive heart and chronic kidney disease with heart failure and with stage 5 chronic kidney disease, or end stage renal disease: Secondary | ICD-10-CM | POA: Insufficient documentation

## 2016-04-07 DIAGNOSIS — Z79899 Other long term (current) drug therapy: Secondary | ICD-10-CM | POA: Insufficient documentation

## 2016-04-07 DIAGNOSIS — R1084 Generalized abdominal pain: Secondary | ICD-10-CM

## 2016-04-07 DIAGNOSIS — E1122 Type 2 diabetes mellitus with diabetic chronic kidney disease: Secondary | ICD-10-CM | POA: Insufficient documentation

## 2016-04-07 DIAGNOSIS — I509 Heart failure, unspecified: Secondary | ICD-10-CM | POA: Insufficient documentation

## 2016-04-07 DIAGNOSIS — I2510 Atherosclerotic heart disease of native coronary artery without angina pectoris: Secondary | ICD-10-CM | POA: Diagnosis not present

## 2016-04-07 DIAGNOSIS — Z992 Dependence on renal dialysis: Secondary | ICD-10-CM | POA: Insufficient documentation

## 2016-04-07 DIAGNOSIS — J45909 Unspecified asthma, uncomplicated: Secondary | ICD-10-CM | POA: Diagnosis not present

## 2016-04-07 DIAGNOSIS — K279 Peptic ulcer, site unspecified, unspecified as acute or chronic, without hemorrhage or perforation: Secondary | ICD-10-CM | POA: Insufficient documentation

## 2016-04-07 LAB — URINALYSIS, ROUTINE W REFLEX MICROSCOPIC
BILIRUBIN URINE: NEGATIVE
Glucose, UA: 250 mg/dL — AB
KETONES UR: NEGATIVE mg/dL
NITRITE: NEGATIVE
Specific Gravity, Urine: 1.01 (ref 1.005–1.030)
pH: 8.5 — ABNORMAL HIGH (ref 5.0–8.0)

## 2016-04-07 LAB — CBC WITH DIFFERENTIAL/PLATELET
BASOS ABS: 0.1 10*3/uL (ref 0.0–0.1)
BASOS PCT: 1 %
EOS ABS: 0.2 10*3/uL (ref 0.0–0.7)
Eosinophils Relative: 3 %
HCT: 35.2 % — ABNORMAL LOW (ref 36.0–46.0)
HEMOGLOBIN: 10.9 g/dL — AB (ref 12.0–15.0)
Lymphocytes Relative: 18 %
Lymphs Abs: 1.4 10*3/uL (ref 0.7–4.0)
MCH: 28.1 pg (ref 26.0–34.0)
MCHC: 31 g/dL (ref 30.0–36.0)
MCV: 90.7 fL (ref 78.0–100.0)
Monocytes Absolute: 1.1 10*3/uL — ABNORMAL HIGH (ref 0.1–1.0)
Monocytes Relative: 13 %
NEUTROS ABS: 5.3 10*3/uL (ref 1.7–7.7)
NEUTROS PCT: 65 %
Platelets: 294 10*3/uL (ref 150–400)
RBC: 3.88 MIL/uL (ref 3.87–5.11)
RDW: 15.9 % — ABNORMAL HIGH (ref 11.5–15.5)
WBC: 8.1 10*3/uL (ref 4.0–10.5)

## 2016-04-07 LAB — URINALYSIS, MICROSCOPIC (REFLEX)

## 2016-04-07 LAB — HCG, SERUM, QUALITATIVE: PREG SERUM: NEGATIVE

## 2016-04-07 MED ORDER — OMEPRAZOLE 20 MG PO CPDR: 20.0000 mg | DELAYED_RELEASE_CAPSULE | Freq: Two times a day (BID) | ORAL | 0 refills | Status: DC

## 2016-04-07 MED ORDER — TRAMADOL HCL 50 MG PO TABS: 50.0000 mg | ORAL_TABLET | Freq: Four times a day (QID) | ORAL | 0 refills | Status: DC | PRN

## 2016-04-07 MED ORDER — KETOROLAC TROMETHAMINE 60 MG/2ML IM SOLN
60.0000 mg | Freq: Once | INTRAMUSCULAR | Status: AC
  Administered 2016-04-07: 60 mg via INTRAMUSCULAR
  Filled 2016-04-07: qty 2

## 2016-04-07 MED ORDER — SUCRALFATE 1 G PO TABS: 1.0000 g | ORAL_TABLET | Freq: Four times a day (QID) | ORAL | 0 refills | Status: DC

## 2016-04-07 MED ORDER — ONDANSETRON 4 MG PO TBDP: 4.0000 mg | ORAL_TABLET | Freq: Three times a day (TID) | ORAL | 0 refills | Status: DC | PRN

## 2016-04-07 NOTE — ED Provider Notes (Signed)
[facility] DEPT [facility] Provider Note   CSN: [id number] Arrival date & time: 04/07/16  0005     History   Chief Complaint Chief Complaint  Patient presents with  . Abdominal Pain    HPI Linda Ellison is a 41 y.o. female. She presents for evaluation of abdominal pain. Was having some pelvic pain after recent cervical biopsy. Seen and evaluated at [facility] hospital AMA. Given IM Toradol, discharged with ibuprofen which she's been taking diligently to 3 times per day since then without relief. Pain has migrated somewhat to the right midabdomen. No fevers or chills. No nausea or vomiting. Her bleeding and pelvic symptoms have stopped.  HPI  Past Medical History:  Diagnosis Date  . Adenoma   . Anginal pain ([place])   . ASCVD (arteriosclerotic cardiovascular disease)   . Asthma   . CAD (coronary artery disease)    a. False positive stress echo 06/2012 at [facility] - cath with mild nonobstructive CAD at [facility] (mild luminal irregularities in LAD, moderate diffuse disease in distal RCA).  . CHF (congestive heart failure) ([place])   . Chronic chest pain    a. H/o longstanding atypical CP. Nonobstructive cath 06/2012 and negative VQ.  Marland Kitchen Depression   . Diabetes mellitus   . Esophageal erosions    a. Per EGD 10/2011.  Marland Kitchen ESRD on hemodialysis ([place])    started dialysis 01/29/11  . Gastroparesis   . GERD (gastroesophageal reflux disease)   . HCAP (healthcare-associated pneumonia) 07/12/2015  . History of echocardiogram    Echo 9/14: EF 55-60%, normal wall motion, normal diastolic function, PASP 21  . Hyperlipidemia   . Hyperparathyroidism, secondary ([place])   . Hypertension    a. Also with hx of hypotension - has been started on midodrine.  . Iron deficiency anemia   . Loss of appetite   . Peripheral neuropathy ([place])   . Peripheral vascular disease ([place])   . PONV (postoperative nausea and vomiting)   . Sciatica   . Shortness of breath 07/12/2015  . Weight loss     Patient Active Problem List   Diagnosis Date Noted  . PNA (pneumonia) 09/08/2015  . Chest pain 07/12/2015  . ESRD (end stage renal disease) on dialysis ([place])   . End stage renal failure on dialysis ([place]) 05/23/2012  . Diabetes mellitus with nephropathy ([place]) 08/11/2011  . Diabetic nephropathy ([place]) 01/26/2011  . Obesity, unspecified 08/08/2008  . GERD 08/08/2008  . Essential hypertension 04/06/1999    Past Surgical History:  Procedure Laterality Date  . AV FISTULA PLACEMENT  08/2010   Left radiocephalic AVF  . AV FISTULA PLACEMENT Right 05/07/2014   Procedure: ARTERIOVENOUS (AV) FISTULA CREATION;  Surgeon: Elam Dutch, MD;  [facility];  Service: Vascular;  Laterality: Right;  . AV FISTULA PLACEMENT Right 07/02/2014   Procedure: INSERTION OF ARTERIOVENOUS (AV) GORE-TEX GRAFT ARM;  Surgeon: Elam Dutch, MD;  Location: [facility];  Service: Vascular;  Laterality: Right;  . CESAREAN SECTION    . CHOLECYSTECTOMY  1996  . COLONOSCOPY    . DILATION AND CURETTAGE OF UTERUS    . ESOPHAGOGASTRODUODENOSCOPY  10/19/2011   Dr. Silvano Rusk  . FOOT SURGERY Left   . FRACTURE SURGERY Left    left femur  . LEFT HEART CATHETERIZATION WITH CORONARY ANGIOGRAM N/A 06/27/2012   Procedure: LEFT HEART CATHETERIZATION WITH CORONARY ANGIOGRAM;  Surgeon: Larey Dresser, MD;  Location: [facility] CATH LAB;  Service: Cardiovascular;  Laterality: N/A;  . LIGATION GORETEX FISTULA  01/04/11   Left AVF  . LIGATION OF ARTERIOVENOUS  FISTULA Left 08/21/2014   Procedure: LIGATION OF ARTERIOVENOUS  FISTULA;  Surgeon: Algernon Huxley, MD;  Location: [facility] ORS;  Service: Vascular;  Laterality: Left;  . PERIPHERAL VASCULAR CATHETERIZATION Left 08/08/2014   Procedure: Upper Extremity Angiography;  Surgeon: Algernon Huxley, MD;  Location: [facility] CV LAB;  Service: Cardiovascular;  Laterality: Left;  . PERIPHERAL VASCULAR CATHETERIZATION Left 08/08/2014   Procedure: Upper Extremity Intervention;  Surgeon: Algernon Huxley, MD;  Location: [facility] CV LAB;   Service: Cardiovascular;  Laterality: Left;  . PERIPHERAL VASCULAR CATHETERIZATION Left 03/08/2016   Procedure: A/V Fistulagram;  Surgeon: Algernon Huxley, MD;  Location: [facility] [facility] CV LAB;  Service: Cardiovascular;  Laterality: Left;  . THROMBECTOMY AND REVISION OF ARTERIOVENTOUS (AV) GORETEX  GRAFT Right 07/16/2014   Procedure: THROMBECTOMY  Right  arm  ARTERIOVENOUS  GORETEX  GRAFT;  Surgeon: Elam Dutch, MD;  Location: [facility];  Service: Vascular;  Laterality: Right;  . TUBAL LIGATION      OB History    Gravida Para Term Preterm AB Living   3 1   1 1 2    SAB TAB Ectopic Multiple Live Births   1       2       Home Medications    Prior to Admission medications   Medication Sig Start Date End Date Taking? Authorizing Provider  acetaminophen-codeine (TYLENOL #3) 300-30 MG tablet Take 1 tablet by mouth every 8 (eight) hours as needed for moderate pain or severe pain.  08/26/15   Historical Provider, MD  albuterol (PROVENTIL HFA;VENTOLIN HFA) 108 (90 Base) MCG/ACT inhaler Inhale 2 puffs into the lungs every 4 (four) hours as needed for wheezing or shortness of breath. 09/01/15   Orpah Greek, MD  amLODipine (NORVASC) 10 MG tablet Take 10 mg by mouth daily. 06/13/14   Historical Provider, MD  aspirin EC 81 MG tablet Take 81 mg by mouth daily.    Historical Provider, MD  azithromycin (ZITHROMAX) 500 MG tablet Take 1 tablet (500 mg total) by mouth daily at 6 PM. Patient not taking: Reported on 03/08/2016 09/11/15   Robbie Lis, MD  Calcium Carbonate-Vitamin D (CALCIUM 600+D) 600-400 MG-UNIT tablet Take 1 tablet by mouth daily.    Historical Provider, MD  cefpodoxime (VANTIN) 200 MG tablet Take 1 tablet (200 mg total) by mouth 2 (two) times daily. Patient not taking: Reported on 03/08/2016 09/11/15   Robbie Lis, MD  citalopram (CELEXA) 10 MG tablet Take 10 mg by mouth at bedtime.     Historical Provider, MD  fluticasone (FLOVENT HFA) 110 MCG/ACT inhaler Inhale 2 puffs into the lungs 2  (two) times daily. 03/26/15   Rolland Porter, MD  gabapentin (NEURONTIN) 100 MG capsule Take 100-200 mg by mouth 2 (two) times daily. Pt takes one capsule in the morning and two at bedtime.    Historical Provider, MD  HYDROcodone-homatropine (HYCODAN) 5-1.5 MG/5ML syrup Take 5 mLs by mouth every 6 (six) hours as needed for cough. 09/01/15   Orpah Greek, MD  hydrOXYzine (ATARAX/VISTARIL) 25 MG tablet Take 25 mg by mouth every 4 (four) hours as needed for itching.     Historical Provider, MD  ibuprofen (ADVIL,MOTRIN) 800 MG tablet Take 1 tablet (800 mg total) by mouth every 8 (eight) hours as needed. 04/02/16   Tresea Mall, CNM  Insulin Detemir (LEVEMIR FLEXTOUCH) 100 UNIT/ML Pen Inject 22 Units into the  skin every 12 (twelve) hours.     Historical Provider, MD  labetalol (NORMODYNE) 300 MG tablet Take 300 mg by mouth 2 (two) times daily. 06/17/15   Historical Provider, MD  linagliptin (TRADJENTA) 5 MG TABS tablet Take 5 mg by mouth at bedtime.    Historical Provider, MD  methimazole (TAPAZOLE) 5 MG tablet Take 5 mg by mouth at bedtime.     Historical Provider, MD  multivitamin (RENA-VIT) TABS tablet Take 1 tablet by mouth daily.    Historical Provider, MD  omeprazole (PRILOSEC) 20 MG capsule Take 1 capsule (20 mg total) by mouth 2 (two) times daily. 04/07/16   Tanna Furry, MD  ondansetron (ZOFRAN ODT) 4 MG disintegrating tablet Take 1 tablet (4 mg total) by mouth every 8 (eight) hours as needed for nausea. 04/07/16   Tanna Furry, MD  sevelamer carbonate (RENVELA) 800 MG tablet Take 800-1,600 mg by mouth 5 (five) times daily. Pt takes two tablets three times daily with meals and one tablet two times daily with snacks.    Historical Provider, MD  simvastatin (ZOCOR) 5 MG tablet Take 5 mg by mouth at bedtime.    Historical Provider, MD  sucralfate (CARAFATE) 1 g tablet Take 1 tablet (1 g total) by mouth 4 (four) times daily. 04/07/16   Tanna Furry, MD  topiramate (TOPAMAX) 50 MG tablet Take 50 mg by mouth  at bedtime.     Historical Provider, MD  traMADol (ULTRAM) 50 MG tablet Take 1 tablet (50 mg total) by mouth every 6 (six) hours as needed. 04/07/16   Tanna Furry, MD    Family History Family History  Problem Relation Age of Onset  . Heart disease Mother     heart attack at 85 y/o-infected valve  . Kidney disease Mother     was a dialysis patient  . Heart disease Brother   . Kidney disease Maternal Grandmother     pre-dialysis  . Diabetes Father   . Hypertension Father   . Kidney failure Paternal Uncle   . Kidney failure Paternal Aunt   . Arthritis Brother   . Diabetes Maternal Aunt   . Hypertension Maternal Aunt   . Diabetes Paternal Aunt   . Heart disease Paternal Aunt   . Hypertension Paternal Aunt   . Colon cancer Neg Hx     Social History Social History  Substance Use Topics  . Smoking status: Never Smoker  . Smokeless tobacco: Never Used  . Alcohol use No     Allergies   Ciprofloxacin; Fluocinolone; Pineapple; Strawberry extract; Tylenol [acetaminophen]; Adhesive [tape]; Chlorhexidine gluconate; Clindamycin; Oxycodone; and Shrimp [shellfish allergy]   Review of Systems Review of Systems  Constitutional: Negative for appetite change, chills, diaphoresis, fatigue and fever.  HENT: Negative for mouth sores, sore throat and trouble swallowing.   Eyes: Negative for visual disturbance.  Respiratory: Negative for cough, chest tightness, shortness of breath and wheezing.   Cardiovascular: Negative for chest pain.  Gastrointestinal: Positive for abdominal pain and nausea. Negative for abdominal distention, diarrhea and vomiting.  Endocrine: Negative for polydipsia, polyphagia and polyuria.  Genitourinary: Negative for dysuria, frequency and hematuria.  Musculoskeletal: Negative for gait problem.  Skin: Negative for color change, pallor and rash.  Neurological: Negative for dizziness, syncope, light-headedness and headaches.  Hematological: Does not bruise/bleed easily.    Psychiatric/Behavioral: Negative for behavioral problems and confusion.     Physical Exam Updated Vital Signs BP 171/96 (BP Location: Right Arm)   Pulse 95   Temp 98.3  F (36.8 C) (Oral)   Resp 16   Ht 5\' 6"  (1.676 m)   Wt 205 lb (93 kg)   LMP 03/23/2016 Comment: tubal, serum hcg//a.c.  SpO2 100%   BMI 33.09 kg/m   Physical Exam  Constitutional: She is oriented to person, place, and time. She appears well-developed and well-nourished. No distress.  HENT:  Head: Normocephalic.  Eyes: Conjunctivae are normal. Pupils are equal, round, and reactive to light. No scleral icterus.  Neck: Normal range of motion. Neck supple. No thyromegaly present.  Cardiovascular: Normal rate and regular rhythm.  Exam reveals no gallop and no friction rub.   No murmur heard. Pulmonary/Chest: Effort normal and breath sounds normal. No respiratory distress. She has no wheezes. She has no rales.  Abdominal: Soft. Bowel sounds are normal. She exhibits no distension. There is no tenderness. There is no rebound.  Right mid abdominal right periumbilical pain. No lower quadrant or pelvic pain to palpate.  Musculoskeletal: Normal range of motion.  Neurological: She is alert and oriented to person, place, and time.  Skin: Skin is warm and dry. No rash noted.  Psychiatric: She has a normal mood and affect. Her behavior is normal.     ED Treatments / Results  Labs (all labs ordered are listed, but only abnormal results are displayed) Labs Reviewed  URINALYSIS, ROUTINE W REFLEX MICROSCOPIC - Abnormal; Notable for the following:       Result Value   APPearance CLOUDY (*)    pH 8.5 (*)    Glucose, UA 250 (*)    Hgb urine dipstick TRACE (*)    Protein, ur >300 (*)    Leukocytes, UA SMALL (*)    All other components within normal limits  CBC WITH DIFFERENTIAL/PLATELET - Abnormal; Notable for the following:    Hemoglobin 10.9 (*)    HCT 35.2 (*)    RDW 15.9 (*)    Monocytes Absolute 1.1 (*)    All  other components within normal limits  URINALYSIS, MICROSCOPIC (REFLEX) - Abnormal; Notable for the following:    Bacteria, UA MANY (*)    Squamous Epithelial / LPF 6-30 (*)    All other components within normal limits  HCG, SERUM, QUALITATIVE    EKG  EKG Interpretation None       Radiology Ct Renal Stone Study  Result Date: 04/07/2016 CLINICAL DATA:  Right lateral abdomen and flank pain for a week. EXAM: CT ABDOMEN AND PELVIS WITHOUT CONTRAST TECHNIQUE: Multidetector CT imaging of the abdomen and pelvis was performed following the standard protocol without IV contrast. COMPARISON:  07/12/2015 FINDINGS: Lower chest: No acute abnormality. Hepatobiliary: No focal liver abnormality is seen. Status post cholecystectomy. No biliary dilatation. Pancreas: There is acute inflammation surrounding the second portion of the duodenum, with moderate mural thickening and adjacent stranding. This most likely is a duodenitis or duodenal peptic ulcer disease. Less likely possibility of pancreatitis is not entirely excluded. Pancreas is otherwise unremarkable in appearance with no evidence of mass or duct dilatation. No peripancreatic fluid collection. Spleen: Normal in size without focal abnormality. Adrenals/Urinary Tract: Both adrenals are unremarkable. Kidneys appear atrophic. No hydronephrosis. Ureters and urinary bladder are unremarkable. Stomach/Bowel: Duodenal mural thickening and inflammation consistent with duodenitis or duodenal peptic ulcer disease. No extraluminal air. No obstruction. Small bowel is otherwise unremarkable. Appendix is normal. Colon is unremarkable. Vascular/Lymphatic: The abdominal aorta is normal in caliber with mild atherosclerotic calcification. Reproductive: Uterus and bilateral adnexa are unremarkable. Other: No ascites.  Tiny fat containing  umbilical hernia. Musculoskeletal: No significant skeletal lesion. IMPRESSION: Acute inflammation surrounding the second portion duodenum with  mural thickening and adjacent stranding. This probably represents duodenitis or duodenal peptic ulcer disease. Pancreatitis is less likely but not entirely excluded. Electronically Signed   By: Andreas Newport M.D.   On: 04/07/2016 03:27    Procedures Procedures (including critical care time)  Medications Ordered in ED Medications  ketorolac (TORADOL) injection 60 mg (60 mg Intramuscular Given 04/07/16 0234)     Initial Impression / Assessment and Plan / ED Course  I have reviewed the triage vital signs and the nursing notes.  Pertinent labs & imaging results that were available during my care of the patient were reviewed by me and considered in my medical decision making (see chart for details).  Clinical Course    Patient does not have epigastric or left upper quadrant pain no pain referring to her back. Doubt pancreatitis. Does not drink. No right upper quadrant tenderness. ECT suggest duodenitis. He secondary to her marked anti-inflammatory use recently. Plan discharge home. Prilosec, Carafate, Zofran, Ultram, primary care follow-up for GI referral if not improving.   Final Clinical Impressions(s) / ED Diagnoses   Final diagnoses:  Generalized abdominal pain  Peptic ulcer disease    New Prescriptions New Prescriptions   OMEPRAZOLE (PRILOSEC) 20 MG CAPSULE    Take 1 capsule (20 mg total) by mouth 2 (two) times daily.   ONDANSETRON (ZOFRAN ODT) 4 MG DISINTEGRATING TABLET    Take 1 tablet (4 mg total) by mouth every 8 (eight) hours as needed for nausea.   SUCRALFATE (CARAFATE) 1 G TABLET    Take 1 tablet (1 g total) by mouth 4 (four) times daily.   TRAMADOL (ULTRAM) 50 MG TABLET    Take 1 tablet (50 mg total) by mouth every 6 (six) hours as needed.     Tanna Furry, MD 04/07/16 [phone]

## 2016-04-07 NOTE — Discharge Instructions (Signed)
Your CT scan suggests inflammation/ulcer in your duodenum. Prescription symptoms for Prilosec and Carafate. Follow-up with her primary care physician. You may need GI referral if not improving.

## 2016-04-07 NOTE — ED Triage Notes (Signed)
Pt c/o right sided abd pain radiating to back with nausea. Pt states she began having pain after a cervical biopsy last week.

## 2016-04-07 NOTE — ED Notes (Signed)
Pt unable to provide urine sample at this time 

## 2016-04-21 ENCOUNTER — Ambulatory Visit: Payer: Self-pay | Admitting: Physician Assistant

## 2016-06-04 HISTORY — PX: TRANSTHORACIC ECHOCARDIOGRAM: SHX275

## 2016-06-07 ENCOUNTER — Encounter (INDEPENDENT_AMBULATORY_CARE_PROVIDER_SITE_OTHER): Payer: Medicare Other | Admitting: Vascular Surgery

## 2016-06-28 ENCOUNTER — Ambulatory Visit: Payer: Self-pay | Admitting: Physician Assistant

## 2016-07-01 ENCOUNTER — Other Ambulatory Visit: Payer: Medicare Other

## 2016-07-01 ENCOUNTER — Encounter: Payer: Self-pay | Admitting: Physician Assistant

## 2016-07-01 ENCOUNTER — Ambulatory Visit (INDEPENDENT_AMBULATORY_CARE_PROVIDER_SITE_OTHER): Payer: Medicare Other | Admitting: Physician Assistant

## 2016-07-01 VITALS — BP 122/70 | HR 80 | Ht 66.0 in | Wt 203.5 lb

## 2016-07-01 DIAGNOSIS — R1084 Generalized abdominal pain: Secondary | ICD-10-CM

## 2016-07-01 DIAGNOSIS — R197 Diarrhea, unspecified: Secondary | ICD-10-CM

## 2016-07-01 DIAGNOSIS — R110 Nausea: Secondary | ICD-10-CM

## 2016-07-01 DIAGNOSIS — R12 Heartburn: Secondary | ICD-10-CM

## 2016-07-01 MED ORDER — OMEPRAZOLE 20 MG PO CPDR: 20.0000 mg | DELAYED_RELEASE_CAPSULE | Freq: Every day | ORAL | 3 refills | Status: DC

## 2016-07-01 NOTE — Patient Instructions (Signed)
Your physician has requested that you go to the basement for lab work before leaving today.  We have sent the following medications to your pharmacy for you to pick up at your convenience: Omeprazole 20 mg daily 30-60 mins before breakfast

## 2016-07-01 NOTE — Progress Notes (Addendum)
Chief Complaint: Diarrhea, heartburn  HPI:  Linda Ellison is a 41 year old African-American female  with diabetes and chronic renal disease on dialysis, she is known to Dr. Carlean Purl returns to clinic today for follow-up of her diarrhea and heartburn.   Please recall patient saw me in clinic 03/03/16 for complaint of heartburn after running out of omeprazole and more than 10 loose bowel movements per day. At that time she told me she worked at the [employer] and was around a lot of patient's and also went to hemodialysis multiple times a week. She was restarted on Omeprazole 20 mg daily and stool studies were ordered including a GI pathogen panel, C. difficile and ova and parasites.   Today, the patient returns to clinic and tells me that she has had continuation of her symptoms. She never returned her stool studies as "my stools were not watery, and they told me they had to be like water". The patient describes that her stools are still very loose but they don't "rush out of her like water". Patient describes that she has had a decrease in the amount of bowel movements describing now that she may have 3-4/ day, worse after eating. Patient describes going 3 days with loose stools and then having 3 days of no stools in a pattern. Associated abdominal bloating.   The patient tells me she did restart her Omeprazole 20 mg and did have relief of her heartburn but ran out of this medication a week ago. She does believe that her diarrhea is somewhat better when she is taking this medication.   Patient denies fever, chills, blood in her stool, melena, weight loss, fatigue, anorexia, nausea, vomiting or symptoms that awaken her at night.  Past Medical History:  Diagnosis Date  . Adenoma   . Anginal pain ([place])   . ASCVD (arteriosclerotic cardiovascular disease)   . Asthma   . CAD (coronary artery disease)    a. False positive stress echo 06/2012 at [facility] - cath with mild nonobstructive CAD at [facility]  (mild luminal irregularities in LAD, moderate diffuse disease in distal RCA).  . CHF (congestive heart failure) ([place])   . Chronic chest pain    a. H/o longstanding atypical CP. Nonobstructive cath 06/2012 and negative VQ.  Marland Kitchen Depression   . Diabetes mellitus   . Esophageal erosions    a. Per EGD 10/2011.  Marland Kitchen ESRD on hemodialysis ([place])    started dialysis 01/29/11  . Gastroparesis   . GERD (gastroesophageal reflux disease)   . HCAP (healthcare-associated pneumonia) 07/12/2015  . History of echocardiogram    Echo 9/14: EF 55-60%, normal wall motion, normal diastolic function, PASP 21  . Hyperlipidemia   . Hyperparathyroidism, secondary ([place])   . Hypertension    a. Also with hx of hypotension - has been started on midodrine.  . Iron deficiency anemia   . Loss of appetite   . Peripheral neuropathy ([place])   . Peripheral vascular disease ([place])   . PONV (postoperative nausea and vomiting)   . Sciatica   . Shortness of breath 07/12/2015  . Weight loss     Past Surgical History:  Procedure Laterality Date  . AV FISTULA PLACEMENT  08/2010   Left radiocephalic AVF  . AV FISTULA PLACEMENT Right 05/07/2014   Procedure: ARTERIOVENOUS (AV) FISTULA CREATION;  Surgeon: Elam Dutch, MD;  Location: [facility] OR;  Service: Vascular;  Laterality: Right;  . AV FISTULA PLACEMENT Right 07/02/2014   Procedure: INSERTION OF ARTERIOVENOUS (AV) GORE-TEX  GRAFT ARM;  Surgeon: Elam Dutch, MD;  Location: [place];  Service: Vascular;  Laterality: Right;  . CESAREAN SECTION    . CHOLECYSTECTOMY  1996  . COLONOSCOPY    . DILATION AND CURETTAGE OF UTERUS    . ESOPHAGOGASTRODUODENOSCOPY  10/19/2011   Dr. Silvano Rusk  . FOOT SURGERY Left   . FRACTURE SURGERY Left    left femur  . LEFT HEART CATHETERIZATION WITH CORONARY ANGIOGRAM N/A 06/27/2012   Procedure: LEFT HEART CATHETERIZATION WITH CORONARY ANGIOGRAM;  Surgeon: Larey Dresser, MD;  Location: [facility] CATH LAB;  Service: Cardiovascular;  Laterality: N/A;  . LIGATION  GORETEX FISTULA  01/04/11   Left AVF  . LIGATION OF ARTERIOVENOUS  FISTULA Left 08/21/2014   Procedure: LIGATION OF ARTERIOVENOUS  FISTULA;  Surgeon: Algernon Huxley, MD;  Location: [facility] ORS;  Service: Vascular;  Laterality: Left;  . PERIPHERAL VASCULAR CATHETERIZATION Left 08/08/2014   Procedure: Upper Extremity Angiography;  Surgeon: Algernon Huxley, MD;  Location: [place] CV LAB;  Service: Cardiovascular;  Laterality: Left;  . PERIPHERAL VASCULAR CATHETERIZATION Left 08/08/2014   Procedure: Upper Extremity Intervention;  Surgeon: Algernon Huxley, MD;  Location: [place] CV LAB;  Service: Cardiovascular;  Laterality: Left;  . PERIPHERAL VASCULAR CATHETERIZATION Left 03/08/2016   Procedure: A/V Fistulagram;  Surgeon: Algernon Huxley, MD;  Location: [place] CV LAB;  Service: Cardiovascular;  Laterality: Left;  . THROMBECTOMY AND REVISION OF ARTERIOVENTOUS (AV) GORETEX  GRAFT Right 07/16/2014   Procedure: THROMBECTOMY  Right  arm  ARTERIOVENOUS  GORETEX  GRAFT;  Surgeon: Elam Dutch, MD;  Location: [place];  Service: Vascular;  Laterality: Right;  . TUBAL LIGATION      Current Outpatient Prescriptions  Medication Sig Dispense Refill  . albuterol (PROVENTIL HFA;VENTOLIN HFA) 108 (90 Base) MCG/ACT inhaler Inhale 2 puffs into the lungs every 4 (four) hours as needed for wheezing or shortness of breath. 1 Inhaler 2  . amLODipine (NORVASC) 10 MG tablet Take 10 mg by mouth daily.    Marland Kitchen aspirin EC 81 MG tablet Take 81 mg by mouth daily.    . Calcium Carbonate-Vitamin D (CALCIUM 600+D) 600-400 MG-UNIT tablet Take 1 tablet by mouth daily.    . cefpodoxime (VANTIN) 200 MG tablet Take 1 tablet (200 mg total) by mouth 2 (two) times daily. 3 tablet 0  . citalopram (CELEXA) 10 MG tablet Take 10 mg by mouth at bedtime.     . fluticasone (FLOVENT HFA) 110 MCG/ACT inhaler Inhale 2 puffs into the lungs 2 (two) times daily. 1 Inhaler 0  . gabapentin (NEURONTIN) 100 MG capsule Take 100-200 mg by mouth 2 (two) times  daily. Pt takes one capsule in the morning and two at bedtime.    . hydrOXYzine (ATARAX/VISTARIL) 25 MG tablet Take 25 mg by mouth every 4 (four) hours as needed for itching.     Marland Kitchen ibuprofen (ADVIL,MOTRIN) 800 MG tablet Take 1 tablet (800 mg total) by mouth every 8 (eight) hours as needed. 30 tablet 0  . Insulin Detemir (LEVEMIR FLEXTOUCH) 100 UNIT/ML Pen Inject 22 Units into the skin every 12 (twelve) hours.     Marland Kitchen labetalol (NORMODYNE) 300 MG tablet Take 300 mg by mouth 2 (two) times daily.    Marland Kitchen linagliptin (TRADJENTA) 5 MG TABS tablet Take 5 mg by mouth at bedtime.    . methimazole (TAPAZOLE) 5 MG tablet Take 5 mg by mouth at bedtime.     . multivitamin (RENA-VIT) TABS tablet Take  1 tablet by mouth daily.    Marland Kitchen omeprazole (PRILOSEC) 20 MG capsule Take 1 capsule (20 mg total) by mouth 2 (two) times daily. 60 capsule 0  . ondansetron (ZOFRAN ODT) 4 MG disintegrating tablet Take 1 tablet (4 mg total) by mouth every 8 (eight) hours as needed for nausea. 6 tablet 0  . sevelamer carbonate (RENVELA) 800 MG tablet Take 800-1,600 mg by mouth 5 (five) times daily. Pt takes two tablets three times daily with meals and one tablet two times daily with snacks.    . simvastatin (ZOCOR) 5 MG tablet Take 5 mg by mouth at bedtime.    . sucralfate (CARAFATE) 1 g tablet Take 1 tablet (1 g total) by mouth 4 (four) times daily. 60 tablet 0  . topiramate (TOPAMAX) 50 MG tablet Take 50 mg by mouth at bedtime.     . traMADol (ULTRAM) 50 MG tablet Take 1 tablet (50 mg total) by mouth every 6 (six) hours as needed. 15 tablet 0   No current facility-administered medications for this visit.     Allergies as of 07/01/2016 - Review Complete 07/01/2016  Allergen Reaction Noted  . Ciprofloxacin Hives, Itching, and Nausea And Vomiting 06/09/2011  . Fluocinolone Itching, Nausea And Vomiting, and Other (See Comments) 01/25/2014  . Pineapple Itching and Other (See Comments) 12/21/2012  . Strawberry extract Itching and Other  (See Comments) 12/21/2012  . Tylenol [acetaminophen] Itching 08/08/2015  . Adhesive [tape] Itching and Rash 05/04/2014  . Chlorhexidine gluconate Itching 07/02/2014  . Clindamycin Diarrhea, Nausea And Vomiting, and Rash 07/01/2014  . Oxycodone Nausea And Vomiting, Rash, and Other (See Comments) 05/09/2014  . Shrimp [shellfish allergy] Rash 08/08/2014    Family History  Problem Relation Age of Onset  . Heart disease Mother     heart attack at 9 y/o-infected valve  . Kidney disease Mother     was a dialysis patient  . Heart disease Brother   . Kidney disease Maternal Grandmother     pre-dialysis  . Diabetes Father   . Hypertension Father   . Kidney failure Paternal Uncle   . Kidney failure Paternal Aunt   . Arthritis Brother   . Diabetes Maternal Aunt   . Hypertension Maternal Aunt   . Diabetes Paternal Aunt   . Heart disease Paternal Aunt   . Hypertension Paternal Aunt   . Colon cancer Neg Hx     Social History   Social History  . Marital status: Single    Spouse name: N/A  . Number of children: 2  . Years of education: N/A   Occupational History  . Unemployed    Social History Main Topics  . Smoking status: Never Smoker  . Smokeless tobacco: Never Used  . Alcohol use No  . Drug use: No  . Sexual activity: Not on file     Comment: BTL   Other Topics Concern  . Not on file   Social History Narrative   Disabled 2/2 to dialysis   Used to be a Consulting civil engineer at [employer]   Has 2 kids   Lives in [place]   No caffeine     Review of Systems:    Constitutional: No weight loss, fever, chills, weakness or fatigue Cardiovascular: No chest pain Respiratory: No SOB Gastrointestinal: See HPI and otherwise negative   Physical Exam:  Vital signs: BP 122/70   Pulse 80   Ht 5\' 6"  (1.676 m)   Wt 203 lb 8 oz (92.3 kg)  LMP 06/28/2016   BMI 32.85 kg/m   Constitutional:   Pleasant African American female appears to be in NAD, Well developed, Well  nourished, alert and cooperative Respiratory: Respirations even and unlabored. Lungs clear to auscultation bilaterally.   No wheezes, crackles, or rhonchi.  Cardiovascular: Normal S1, S2. No MRG. Regular rate and rhythm. No peripheral edema, cyanosis or pallor.  Gastrointestinal:  Soft, nondistended, nontender. No rebound or guarding. Normal bowel sounds. No appreciable masses or hepatomegaly. Psychiatric: Demonstrates good judgement and reason without abnormal affect or behaviors.  MOST RECENT LABS: CBC    Component Value Date/Time   WBC 8.1 04/07/2016 0225   RBC 3.88 04/07/2016 0225   HGB 10.9 (L) 04/07/2016 0225   HCT 35.2 (L) 04/07/2016 0225   PLT 294 04/07/2016 0225   MCV 90.7 04/07/2016 0225   MCH 28.1 04/07/2016 0225   MCHC 31.0 04/07/2016 0225   RDW 15.9 (H) 04/07/2016 0225   LYMPHSABS 1.4 04/07/2016 0225   MONOABS 1.1 (H) 04/07/2016 0225   EOSABS 0.2 04/07/2016 0225   BASOSABS 0.1 04/07/2016 0225    CMP     Component Value Date/Time   NA 137 09/11/2015 0745   K 4.3 09/11/2015 0745   CL 103 09/11/2015 0745   CO2 24 09/11/2015 0745   GLUCOSE 119 (H) 09/11/2015 0745   BUN 35 (H) 09/11/2015 0745   CREATININE 9.67 (H) 09/11/2015 0745   CALCIUM 8.2 (L) 09/11/2015 0745   PROT 6.3 (L) 09/01/2015 0541   ALBUMIN 2.7 (L) 09/11/2015 0745   AST 12 (L) 09/01/2015 0541   ALT 14 09/01/2015 0541   ALKPHOS 54 09/01/2015 0541   BILITOT 0.7 09/01/2015 0541   GFRNONAA 5 (L) 09/11/2015 0745   GFRAA 5 (L) 09/11/2015 0745    Assessment: 1. Diarrhea: Continues per the patient, though some improvement from more than 10 bowel movements a day down to 3 or 4 over the past 4 mos, typically after meals, patient can also have 2-3 days with no bowel movement; consider IBS versus infectious cause vs gastritis vs other 2. Heartburn: Controlled when patient is on Omeprazole 20 mg daily, she recently ran out again a week ago  Plan: 1. Patient never returned her stool studies as asked  previously. Re-ordered a GI pathogen panel, O and P and C. difficile 2. Discussed with the patient that if results above are negative we can try Lomotil 3. If results above are negative and she continues with symptoms, would recommend a colonoscopy 4. Prescribed Omeprazole 20 mg daily, 30-60 minutes before breakfast #90 with 3 refills 5. Patient to follow in clinic with Dr. Carlean Purl or myself pending results of above studies  Linda Newer, PA-C [facility] 07/01/2016, 10:42 AM  Cc: Linda Bravo, MD   Agree with Ms. Alphia Kava management.  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

## 2016-07-02 LAB — CLOSTRIDIUM DIFFICILE BY PCR: Toxigenic C. Difficile by PCR: NOT DETECTED

## 2016-07-02 LAB — FECAL LACTOFERRIN, QUANT: Lactoferrin: NEGATIVE

## 2016-07-02 LAB — OVA AND PARASITE EXAMINATION: OP: NONE SEEN

## 2016-07-05 LAB — GASTROINTESTINAL PATHOGEN PANEL PCR
C. DIFFICILE TOX A/B, PCR: NOT DETECTED
CAMPYLOBACTER, PCR: NOT DETECTED
Cryptosporidium, PCR: NOT DETECTED
E COLI 0157, PCR: NOT DETECTED
E coli (ETEC) LT/ST PCR: NOT DETECTED
E coli (STEC) stx1/stx2, PCR: NOT DETECTED
GIARDIA LAMBLIA, PCR: NOT DETECTED
Norovirus, PCR: NOT DETECTED
ROTAVIRUS, PCR: NOT DETECTED
SALMONELLA, PCR: NOT DETECTED
Shigella, PCR: NOT DETECTED

## 2016-07-06 ENCOUNTER — Other Ambulatory Visit: Payer: Self-pay

## 2016-07-06 MED ORDER — DIPHENOXYLATE-ATROPINE 2.5-0.025 MG PO TABS: 1.0000 | ORAL_TABLET | Freq: Four times a day (QID) | ORAL | 0 refills | Status: DC | PRN

## 2016-07-08 ENCOUNTER — Telehealth: Payer: Self-pay | Admitting: Physician Assistant

## 2016-07-08 MED ORDER — DIPHENOXYLATE-ATROPINE 2.5-0.025 MG PO TABS: 1.0000 | ORAL_TABLET | Freq: Four times a day (QID) | ORAL | 0 refills | Status: DC | PRN

## 2016-07-08 NOTE — Telephone Encounter (Signed)
Medication reprinted and faxed to Ronda Fairly.

## 2016-08-12 ENCOUNTER — Encounter (HOSPITAL_BASED_OUTPATIENT_CLINIC_OR_DEPARTMENT_OTHER): Payer: Self-pay | Admitting: *Deleted

## 2016-08-13 ENCOUNTER — Encounter (HOSPITAL_BASED_OUTPATIENT_CLINIC_OR_DEPARTMENT_OTHER): Payer: Self-pay | Admitting: *Deleted

## 2016-08-13 NOTE — Progress Notes (Addendum)
NPO AFTER MN.  ARRIVE AT 0600.  GETTING CBC AND CMET DONE Wednesday 08-18-2016 AFTER DIALYSIS.  CURRENT EKG IN CHART AND EPIC.  WILL TAKE AM MEDS AND DO FLOVENT INHALER DOS W/ SIPS OF WATER , WILL NOT DO INSULIN.  ADDENDUM:  REVIEWED CHART W/ DR GERMEROTH MDA. OK TO PROCEED AND  STATED PT WILL NEED ISTAT 8 ON ARRIVAL AM DOS EVEN THOUGH PT GETTING CBC AND CMET DONE DAY BEFORE PROCEDURE.

## 2016-08-16 ENCOUNTER — Ambulatory Visit: Payer: Self-pay | Admitting: Internal Medicine

## 2016-08-18 DIAGNOSIS — E0590 Thyrotoxicosis, unspecified without thyrotoxic crisis or storm: Secondary | ICD-10-CM | POA: Diagnosis not present

## 2016-08-18 DIAGNOSIS — N2581 Secondary hyperparathyroidism of renal origin: Secondary | ICD-10-CM | POA: Diagnosis not present

## 2016-08-18 DIAGNOSIS — Z881 Allergy status to other antibiotic agents status: Secondary | ICD-10-CM | POA: Diagnosis not present

## 2016-08-18 DIAGNOSIS — E1142 Type 2 diabetes mellitus with diabetic polyneuropathy: Secondary | ICD-10-CM | POA: Diagnosis not present

## 2016-08-18 DIAGNOSIS — K219 Gastro-esophageal reflux disease without esophagitis: Secondary | ICD-10-CM | POA: Diagnosis not present

## 2016-08-18 DIAGNOSIS — D631 Anemia in chronic kidney disease: Secondary | ICD-10-CM | POA: Diagnosis not present

## 2016-08-18 DIAGNOSIS — I2510 Atherosclerotic heart disease of native coronary artery without angina pectoris: Secondary | ICD-10-CM | POA: Diagnosis not present

## 2016-08-18 DIAGNOSIS — Z91013 Allergy to seafood: Secondary | ICD-10-CM | POA: Diagnosis not present

## 2016-08-18 DIAGNOSIS — I120 Hypertensive chronic kidney disease with stage 5 chronic kidney disease or end stage renal disease: Secondary | ICD-10-CM | POA: Diagnosis not present

## 2016-08-18 DIAGNOSIS — Z992 Dependence on renal dialysis: Secondary | ICD-10-CM | POA: Diagnosis not present

## 2016-08-18 DIAGNOSIS — F329 Major depressive disorder, single episode, unspecified: Secondary | ICD-10-CM | POA: Diagnosis not present

## 2016-08-18 DIAGNOSIS — Z794 Long term (current) use of insulin: Secondary | ICD-10-CM | POA: Diagnosis not present

## 2016-08-18 DIAGNOSIS — Z841 Family history of disorders of kidney and ureter: Secondary | ICD-10-CM | POA: Diagnosis not present

## 2016-08-18 DIAGNOSIS — N920 Excessive and frequent menstruation with regular cycle: Secondary | ICD-10-CM | POA: Diagnosis present

## 2016-08-18 DIAGNOSIS — E1122 Type 2 diabetes mellitus with diabetic chronic kidney disease: Secondary | ICD-10-CM | POA: Diagnosis not present

## 2016-08-18 DIAGNOSIS — Z79899 Other long term (current) drug therapy: Secondary | ICD-10-CM | POA: Diagnosis not present

## 2016-08-18 DIAGNOSIS — E1151 Type 2 diabetes mellitus with diabetic peripheral angiopathy without gangrene: Secondary | ICD-10-CM | POA: Diagnosis not present

## 2016-08-18 DIAGNOSIS — J45909 Unspecified asthma, uncomplicated: Secondary | ICD-10-CM | POA: Diagnosis not present

## 2016-08-18 DIAGNOSIS — E785 Hyperlipidemia, unspecified: Secondary | ICD-10-CM | POA: Diagnosis not present

## 2016-08-18 DIAGNOSIS — N186 End stage renal disease: Secondary | ICD-10-CM | POA: Diagnosis not present

## 2016-08-18 LAB — COMPREHENSIVE METABOLIC PANEL
ALK PHOS: 62 U/L (ref 38–126)
ALT: 23 U/L (ref 14–54)
AST: 22 U/L (ref 15–41)
Albumin: 4.4 g/dL (ref 3.5–5.0)
Anion gap: 12 (ref 5–15)
BILIRUBIN TOTAL: 0.3 mg/dL (ref 0.3–1.2)
BUN: 25 mg/dL — AB (ref 6–20)
CALCIUM: 9.1 mg/dL (ref 8.9–10.3)
CHLORIDE: 93 mmol/L — AB (ref 101–111)
CO2: 33 mmol/L — ABNORMAL HIGH (ref 22–32)
CREATININE: 6.34 mg/dL — AB (ref 0.44–1.00)
GFR calc Af Amer: 9 mL/min — ABNORMAL LOW (ref >60)
GFR, EST NON AFRICAN AMERICAN: 7 mL/min — AB (ref >60)
Glucose, Bld: 194 mg/dL — ABNORMAL HIGH (ref 65–99)
Potassium: 4.6 mmol/L (ref 3.5–5.1)
Sodium: 138 mmol/L (ref 135–145)
Total Protein: 8.8 g/dL — ABNORMAL HIGH (ref 6.5–8.1)

## 2016-08-18 LAB — CBC
HCT: 38.3 % (ref 36.0–46.0)
HEMOGLOBIN: 12.1 g/dL (ref 12.0–15.0)
MCH: 26 pg (ref 26.0–34.0)
MCHC: 31.6 g/dL (ref 30.0–36.0)
MCV: 82.4 fL (ref 78.0–100.0)
PLATELETS: 198 10*3/uL (ref 150–400)
RBC: 4.65 MIL/uL (ref 3.87–5.11)
RDW: 18 % — ABNORMAL HIGH (ref 11.5–15.5)
WBC: 8.4 10*3/uL (ref 4.0–10.5)

## 2016-08-18 NOTE — Anesthesia Preprocedure Evaluation (Addendum)
Anesthesia Evaluation  Patient identified by MRN, date of birth, ID band Patient awake    Reviewed: Allergy & Precautions, H&P , NPO status , Patient's Chart, lab work & pertinent test results, reviewed documented beta blocker date and time   History of Anesthesia Complications (+) PONV  Airway Mallampati: III  TM Distance: >3 FB Neck ROM: Full    Dental no notable dental hx.    Pulmonary asthma (last inhaler 10 yrs ago) ,    Pulmonary exam normal breath sounds clear to auscultation       Cardiovascular hypertension, Pt. on medications and Pt. on home beta blockers (-) angina+ CAD, + Peripheral Vascular Disease and +CHF   Rhythm:regular Rate:Normal     Neuro/Psych  Neuromuscular disease (peripheral neuropathy)    GI/Hepatic GERD  Medicated and Controlled,  Endo/Other  diabetes, Poorly Controlled, Type 2, Oral Hypoglycemic Agents, Insulin Dependent  Renal/GU ESRF and DialysisRenal disease     Musculoskeletal   Abdominal   Peds  Hematology  (+) anemia ,   Anesthesia Other Findings   Reproductive/Obstetrics                           Anesthesia Physical  Anesthesia Plan  ASA: IV  Anesthesia Plan: General   Post-op Pain Management:    Induction: Intravenous  Airway Management Planned: LMA  Additional Equipment:   Intra-op Plan:   Post-operative Plan:   Informed Consent: I have reviewed the patients History and Physical, chart, labs and discussed the procedure including the risks, benefits and alternatives for the proposed anesthesia with the patient or authorized representative who has indicated his/her understanding and acceptance.   Dental Advisory Given  Plan Discussed with: CRNA and Surgeon  Anesthesia Plan Comments:        Anesthesia Quick Evaluation

## 2016-08-18 NOTE — H&P (Signed)
Linda Ellison is an 41 y.o. female G3P2 who presents for endometrial ablation for menorrhagia and some episodes of BTB.  Pt reported irregular bleeding beginning over last 6-8  months.   She had a workup including a normal SIUS and  Negative EMB.   She has multiple medical comorbidities. Pt on  dialysis from 2012, ESRD from [facility], DM--multiple family members on dialysis. DM is fairly well controlled Hgb A1C 6.5. BP well-controlled. Two children 105 y/o, 68 y/o.  Pertinent Gynecological History: Menses: 07/23/16  OB History: NSVD x 1 C-Section x 1   Menstrual History:  Patient's last menstrual period was 07/23/2016 (approximate).    Past Medical History:  Diagnosis Date  . Anemia associated with chronic renal failure   . Asthma   . Atypical chest pain    long-standing -- normal cardio cath 06-27-2012 and normal nuclear stress test 06-25-2016  . AV (arteriovenous fistula) (HCC)    for dialysis-- currently located left radiocephalic  . CAD (coronary artery disease) cardiologist-- dr Clydie Braun ([facility] in [place])   a. False positive stress echo 06/2012 at [facility] - cath with no obstructive CAD at [facility] (mild luminal irregularities in LAD, moderate diffuse disease in distal RCA).  . Depression   . ESRD on hemodialysis [facility]) NEPHROLOGIST-  DR MATTINGLY   started dialysis 01/29/11: [facility] on MWF  . Gastroparesis   . GERD (gastroesophageal reflux disease)   . History of pneumonia    HCAP 04/ 2017  . Hyperlipidemia   . Hyperthyroidism   . Menorrhagia   . Other secondary hypertension    associated to diabetes -- followed by cardiologist ( dr Clydie Braun)    . Peripheral neuropathy   . PONV (postoperative nausea and vomiting)   . Pre-transplant evaluation for kidney transplant    [facility]  . Secondary hyperparathyroidism of renal origin ([place])   . Type 2 diabetes mellitus, with long-term current use of insulin  ([place])    dx 1985    Past Surgical History:  Procedure Laterality Date  . AV FISTULA PLACEMENT  08/2010   Left radiocephalic AVF  . AV FISTULA PLACEMENT Right 05/07/2014   Procedure: ARTERIOVENOUS (AV) FISTULA CREATION;  Surgeon: Elam Dutch, MD;  Location: [facility] OR;  Service: Vascular;  Laterality: Right;  . AV FISTULA PLACEMENT Right 07/02/2014   Procedure: INSERTION OF ARTERIOVENOUS (AV) GORE-TEX GRAFT ARM;  Surgeon: Elam Dutch, MD;  Location: [place];  Service: Vascular;  Laterality: Right;  . CARDIOVASCULAR STRESS TEST  06/25/2016   [place]   normal nuclear perfusion study w/ no ischemia/  normal LV function and wall motion , ef 51%  . CESAREAN SECTION  03/22/2001   w/ Bilateral Tubal Ligation  . COLONOSCOPY WITH ESOPHAGOGASTRODUODENOSCOPY (EGD)  10/19/2011  . DILATION AND CURETTAGE OF UTERUS  05/12/2000   w/ suction for missed ab  . DOBUTAMINE STRESS ECHO  06/04/2016    [facility]   normal stress echo w/ no chest pain or ischemia/  normal LV function and wall motion , stress ef 60-65%  . ESOPHAGOGASTRODUODENOSCOPY  10/19/2011   Dr. Silvano Rusk  . FEMUR IM NAIL Left child   removed  . FOOT SURGERY Left 2014 approx.  Marland Kitchen LAPAROSCOPIC CHOLECYSTECTOMY  1994  . LEFT HEART CATHETERIZATION WITH CORONARY ANGIOGRAM N/A 06/27/2012   Procedure: LEFT HEART CATHETERIZATION WITH CORONARY ANGIOGRAM;  Surgeon: Larey Dresser, MD;  Location: [facility] CATH LAB;  Service: Cardiovascular;  Laterality: N/A; No  ostructive CAD, normal LVF, ef 55-60% (mild LAD luminal irregularities, moderate dRCA diffuse disease)  . LIGATION GORETEX FISTULA  01/04/11   Left AVF  . LIGATION OF ARTERIOVENOUS  FISTULA Left 08/21/2014   Procedure: LIGATION OF ARTERIOVENOUS  FISTULA;  Surgeon: Algernon Huxley, MD;  Location: [facility] ORS;  Service: Vascular;  Laterality: Left;  . PERIPHERAL VASCULAR CATHETERIZATION Left 08/08/2014   Procedure: Upper Extremity Angiography;  Surgeon: Algernon Huxley, MD;  Location: [place] CV LAB;  Service:  Cardiovascular;  Laterality: Left;  . PERIPHERAL VASCULAR CATHETERIZATION Left 08/08/2014   Procedure: Upper Extremity Intervention;  Surgeon: Algernon Huxley, MD;  Location: [place] CV LAB;  Service: Cardiovascular;  Laterality: Left;  . PERIPHERAL VASCULAR CATHETERIZATION Left 03/08/2016   Procedure: A/V Fistulagram;  Surgeon: Algernon Huxley, MD;  Location: [place] CV LAB;  Service: Cardiovascular;  Laterality: Left;  . THROMBECTOMY AND REVISION OF ARTERIOVENTOUS (AV) GORETEX  GRAFT Right 07/16/2014   Procedure: THROMBECTOMY  Right  arm  ARTERIOVENOUS  GORETEX  GRAFT;  Surgeon: Elam Dutch, MD;  Location: [place];  Service: Vascular;  Laterality: Right;  . TRANSTHORACIC ECHOCARDIOGRAM  06/04/2016   mild concentric LVH, ef 55-60%,  mild TR,  borderline LAE,  trivial MR and PR    Family History  Problem Relation Age of Onset  . Heart disease Mother        heart attack at 55 y/o-infected valve  . Kidney disease Mother        was a dialysis patient  . Heart disease Brother   . Kidney disease Maternal Grandmother        pre-dialysis  . Diabetes Father   . Hypertension Father   . Kidney failure Paternal Uncle   . Kidney failure Paternal Aunt   . Arthritis Brother   . Diabetes Maternal Aunt   . Hypertension Maternal Aunt   . Diabetes Paternal Aunt   . Heart disease Paternal Aunt   . Hypertension Paternal Aunt   . Colon cancer Neg Hx     Social History:  reports that she has never smoked. She has never used smokeless tobacco. She reports that she does not drink alcohol or use drugs.  Allergies:  Allergies  Allergen Reactions  . Ciprofloxacin Hives, Itching and Nausea And Vomiting  . Fluocinolone Itching, Nausea And Vomiting and Other (See Comments)  . Pineapple Itching and Other (See Comments)    Pt states that this medication makes her tongue raw.    . Strawberry Extract Itching and Other (See Comments)    Pt states that this medication makes her tongue raw.    . Tylenol  [Acetaminophen] Itching  . Adhesive [Tape] Itching and Rash  . Chlorhexidine Gluconate Itching  . Clindamycin Diarrhea, Nausea And Vomiting and Rash  . Oxycodone Nausea And Vomiting, Rash and Other (See Comments)    Reaction:  Hallucinations   . Shrimp [Shellfish Allergy] Rash    No prescriptions prior to admission.    Review of Systems  Cardiovascular: Negative for chest pain.  Gastrointestinal: Negative for abdominal pain.    Height 5\' 6"  (1.676 m), weight 93 kg (205 lb), last menstrual period 07/23/2016. Physical Exam  Cardiovascular: Normal rate and regular rhythm.   Respiratory: Effort normal.  GI: Soft.  Genitourinary: Vagina normal and uterus normal.  Musculoskeletal:  Fistula in arm for dialysis  Neurological: She is alert.  Psychiatric: She has a normal mood and affect.    Results for orders placed or performed  during the hospital encounter of 08/19/16 (from the past 24 hour(s))  CBC     Status: Abnormal   Collection Time: 08/18/16  2:10 PM  Result Value Ref Range   WBC 8.4 4.0 - 10.5 K/uL   RBC 4.65 3.87 - 5.11 MIL/uL   Hemoglobin 12.1 12.0 - 15.0 g/dL   HCT 38.3 36.0 - 46.0 %   MCV 82.4 78.0 - 100.0 fL   MCH 26.0 26.0 - 34.0 pg   MCHC 31.6 30.0 - 36.0 g/dL   RDW 18.0 (H) 11.5 - 15.5 %   Platelets 198 150 - 400 K/uL  Comprehensive metabolic panel     Status: Abnormal   Collection Time: 08/18/16  2:10 PM  Result Value Ref Range   Sodium 138 135 - 145 mmol/L   Potassium 4.6 3.5 - 5.1 mmol/L   Chloride 93 (L) 101 - 111 mmol/L   CO2 33 (H) 22 - 32 mmol/L   Glucose, Bld 194 (H) 65 - 99 mg/dL   BUN 25 (H) 6 - 20 mg/dL   Creatinine, Ser 6.34 (H) 0.44 - 1.00 mg/dL   Calcium 9.1 8.9 - 10.3 mg/dL   Total Protein 8.8 (H) 6.5 - 8.1 g/dL   Albumin 4.4 3.5 - 5.0 g/dL   AST 22 15 - 41 U/L   ALT 23 14 - 54 U/L   Alkaline Phosphatase 62 38 - 126 U/L   Total Bilirubin 0.3 0.3 - 1.2 mg/dL   GFR calc non Af Amer 7 (L) >60 mL/min   GFR calc Af Amer 9 (L) >60 mL/min    Anion gap 12 5 - 15    No results found.  Assessment/Plan: he patient was counseled on the novasure procedure in detail. The risks of bleeding and infection and possible uterine perforation were reviewed. We discussed that the procedure will usually reduce bleeding significantly, but may not eliminate periods. We also discussed that she should not perform this procedure if she desires any future pregnancies. She plans no future childbearing and has had a tubal ligation. We will plan this in the hospital do to medical issues. Has received clearance from nephrology and cardiology  Logan Bores 08/18/2016, 8:59 PM

## 2016-08-19 ENCOUNTER — Ambulatory Visit (HOSPITAL_BASED_OUTPATIENT_CLINIC_OR_DEPARTMENT_OTHER): Payer: Medicare Other | Admitting: Anesthesiology

## 2016-08-19 ENCOUNTER — Encounter (HOSPITAL_BASED_OUTPATIENT_CLINIC_OR_DEPARTMENT_OTHER): Payer: Self-pay | Admitting: *Deleted

## 2016-08-19 ENCOUNTER — Ambulatory Visit (HOSPITAL_BASED_OUTPATIENT_CLINIC_OR_DEPARTMENT_OTHER)
Admission: RE | Admit: 2016-08-19 | Discharge: 2016-08-19 | Disposition: A | Discharge status: Home / Self Care | Payer: Medicare Other | Source: Ambulatory Visit | Attending: Obstetrics and Gynecology | Admitting: Obstetrics and Gynecology

## 2016-08-19 ENCOUNTER — Ambulatory Visit: Payer: Self-pay | Admitting: Internal Medicine

## 2016-08-19 ENCOUNTER — Encounter (HOSPITAL_BASED_OUTPATIENT_CLINIC_OR_DEPARTMENT_OTHER)
Admission: RE | Disposition: A | Discharge status: Home / Self Care | Payer: Self-pay | Source: Ambulatory Visit | Attending: Obstetrics and Gynecology

## 2016-08-19 DIAGNOSIS — Z992 Dependence on renal dialysis: Secondary | ICD-10-CM | POA: Diagnosis not present

## 2016-08-19 DIAGNOSIS — Z841 Family history of disorders of kidney and ureter: Secondary | ICD-10-CM | POA: Insufficient documentation

## 2016-08-19 DIAGNOSIS — N2581 Secondary hyperparathyroidism of renal origin: Secondary | ICD-10-CM | POA: Insufficient documentation

## 2016-08-19 DIAGNOSIS — E1142 Type 2 diabetes mellitus with diabetic polyneuropathy: Secondary | ICD-10-CM | POA: Insufficient documentation

## 2016-08-19 DIAGNOSIS — N920 Excessive and frequent menstruation with regular cycle: Secondary | ICD-10-CM | POA: Insufficient documentation

## 2016-08-19 DIAGNOSIS — Z91013 Allergy to seafood: Secondary | ICD-10-CM | POA: Insufficient documentation

## 2016-08-19 DIAGNOSIS — Z794 Long term (current) use of insulin: Secondary | ICD-10-CM | POA: Insufficient documentation

## 2016-08-19 DIAGNOSIS — D631 Anemia in chronic kidney disease: Secondary | ICD-10-CM | POA: Insufficient documentation

## 2016-08-19 DIAGNOSIS — K219 Gastro-esophageal reflux disease without esophagitis: Secondary | ICD-10-CM | POA: Insufficient documentation

## 2016-08-19 DIAGNOSIS — I120 Hypertensive chronic kidney disease with stage 5 chronic kidney disease or end stage renal disease: Secondary | ICD-10-CM | POA: Insufficient documentation

## 2016-08-19 DIAGNOSIS — F329 Major depressive disorder, single episode, unspecified: Secondary | ICD-10-CM | POA: Insufficient documentation

## 2016-08-19 DIAGNOSIS — N186 End stage renal disease: Secondary | ICD-10-CM | POA: Insufficient documentation

## 2016-08-19 DIAGNOSIS — E1122 Type 2 diabetes mellitus with diabetic chronic kidney disease: Secondary | ICD-10-CM | POA: Insufficient documentation

## 2016-08-19 DIAGNOSIS — I2510 Atherosclerotic heart disease of native coronary artery without angina pectoris: Secondary | ICD-10-CM | POA: Insufficient documentation

## 2016-08-19 DIAGNOSIS — E1151 Type 2 diabetes mellitus with diabetic peripheral angiopathy without gangrene: Secondary | ICD-10-CM | POA: Insufficient documentation

## 2016-08-19 DIAGNOSIS — Z79899 Other long term (current) drug therapy: Secondary | ICD-10-CM | POA: Insufficient documentation

## 2016-08-19 DIAGNOSIS — E0590 Thyrotoxicosis, unspecified without thyrotoxic crisis or storm: Secondary | ICD-10-CM | POA: Insufficient documentation

## 2016-08-19 DIAGNOSIS — E785 Hyperlipidemia, unspecified: Secondary | ICD-10-CM | POA: Insufficient documentation

## 2016-08-19 DIAGNOSIS — J45909 Unspecified asthma, uncomplicated: Secondary | ICD-10-CM | POA: Insufficient documentation

## 2016-08-19 DIAGNOSIS — Z881 Allergy status to other antibiotic agents status: Secondary | ICD-10-CM | POA: Insufficient documentation

## 2016-08-19 HISTORY — DX: Personal history of pneumonia (recurrent): Z87.01

## 2016-08-19 HISTORY — DX: Secondary hyperparathyroidism of renal origin: N25.81

## 2016-08-19 HISTORY — DX: Other secondary hypertension: I15.8

## 2016-08-19 HISTORY — DX: Excessive and frequent menstruation with regular cycle: N92.0

## 2016-08-19 HISTORY — DX: Thyrotoxicosis, unspecified without thyrotoxic crisis or storm: E05.90

## 2016-08-19 HISTORY — DX: Long term (current) use of insulin: Z79.4

## 2016-08-19 HISTORY — PX: HYSTEROSCOPY WITH NOVASURE: SHX5574

## 2016-08-19 HISTORY — DX: Arteriovenous fistula, acquired: I77.0

## 2016-08-19 HISTORY — DX: Chronic kidney disease, unspecified: N18.9

## 2016-08-19 HISTORY — DX: Type 2 diabetes mellitus without complications: E11.9

## 2016-08-19 HISTORY — DX: Other chest pain: R07.89

## 2016-08-19 HISTORY — DX: Anemia in chronic kidney disease: D63.1

## 2016-08-19 HISTORY — DX: Encounter for other preprocedural examination: Z01.818

## 2016-08-19 LAB — POCT I-STAT, CHEM 8
BUN: 43 mg/dL — ABNORMAL HIGH (ref 6–20)
CHLORIDE: 94 mmol/L — AB (ref 101–111)
Calcium, Ion: 1.13 mmol/L — ABNORMAL LOW (ref 1.15–1.40)
Creatinine, Ser: 9.1 mg/dL — ABNORMAL HIGH (ref 0.44–1.00)
Glucose, Bld: 154 mg/dL — ABNORMAL HIGH (ref 65–99)
HEMATOCRIT: 40 % (ref 36.0–46.0)
Hemoglobin: 13.6 g/dL (ref 12.0–15.0)
Potassium: 5.6 mmol/L — ABNORMAL HIGH (ref 3.5–5.1)
SODIUM: 136 mmol/L (ref 135–145)
TCO2: 33 mmol/L (ref 0–100)

## 2016-08-19 LAB — GLUCOSE, CAPILLARY: GLUCOSE-CAPILLARY: 119 mg/dL — AB (ref 65–99)

## 2016-08-19 SURGERY — HYSTEROSCOPY WITH NOVASURE
Anesthesia: General | Site: Uterus

## 2016-08-19 MED ORDER — SILVER NITRATE-POT NITRATE 75-25 % EX MISC
CUTANEOUS | Status: DC | PRN
  Administered 2016-08-19: 1

## 2016-08-19 MED ORDER — SCOPOLAMINE 1 MG/3DAYS TD PT72
1.0000 | MEDICATED_PATCH | Freq: Once | TRANSDERMAL | Status: DC
  Administered 2016-08-19: 1.5 mg via TRANSDERMAL
  Filled 2016-08-19: qty 1

## 2016-08-19 MED ORDER — LIDOCAINE HCL 1 % IJ SOLN
INTRAMUSCULAR | Status: DC | PRN
  Administered 2016-08-19: 20 mL

## 2016-08-19 MED ORDER — SODIUM CHLORIDE 0.9 % IV SOLN
INTRAVENOUS | Status: DC
  Administered 2016-08-19: 07:00:00 via INTRAVENOUS
  Filled 2016-08-19: qty 1000

## 2016-08-19 MED ORDER — ONDANSETRON HCL 4 MG PO TABS: 4.0000 mg | ORAL_TABLET | Freq: Three times a day (TID) | ORAL | 0 refills | Status: DC | PRN

## 2016-08-19 MED ORDER — FENTANYL CITRATE (PF) 100 MCG/2ML IJ SOLN
INTRAMUSCULAR | Status: AC
  Filled 2016-08-19: qty 2

## 2016-08-19 MED ORDER — FENTANYL CITRATE (PF) 100 MCG/2ML IJ SOLN
INTRAMUSCULAR | Status: DC | PRN
  Administered 2016-08-19: 50 ug via INTRAVENOUS
  Administered 2016-08-19: 25 ug via INTRAVENOUS

## 2016-08-19 MED ORDER — SODIUM CHLORIDE 0.9 % IR SOLN
Status: DC | PRN
  Administered 2016-08-19: 3000 mL

## 2016-08-19 MED ORDER — ONDANSETRON HCL 4 MG/2ML IJ SOLN
INTRAMUSCULAR | Status: AC
  Filled 2016-08-19: qty 2

## 2016-08-19 MED ORDER — HYDROMORPHONE HCL 2 MG PO TABS
2.0000 mg | ORAL_TABLET | Freq: Once | ORAL | Status: AC
  Administered 2016-08-19: 2 mg via ORAL
  Filled 2016-08-19: qty 1

## 2016-08-19 MED ORDER — FENTANYL CITRATE (PF) 100 MCG/2ML IJ SOLN
25.0000 ug | INTRAMUSCULAR | Status: DC | PRN
  Filled 2016-08-19: qty 1

## 2016-08-19 MED ORDER — PROPOFOL 10 MG/ML IV BOLUS
INTRAVENOUS | Status: DC | PRN
  Administered 2016-08-19: 160 mg via INTRAVENOUS
  Administered 2016-08-19: 40 mg via INTRAVENOUS
  Administered 2016-08-19: 100 mg via INTRAVENOUS

## 2016-08-19 MED ORDER — LIDOCAINE 2% (20 MG/ML) 5 ML SYRINGE
INTRAMUSCULAR | Status: AC
  Filled 2016-08-19: qty 5

## 2016-08-19 MED ORDER — HYDROMORPHONE HCL 2 MG PO TABS: 2.0000 mg | ORAL_TABLET | ORAL | 0 refills | Status: DC | PRN

## 2016-08-19 MED ORDER — MIDAZOLAM HCL 5 MG/5ML IJ SOLN
INTRAMUSCULAR | Status: DC | PRN
  Administered 2016-08-19: 1 mg via INTRAVENOUS

## 2016-08-19 MED ORDER — HYDROMORPHONE HCL 2 MG PO TABS
ORAL_TABLET | ORAL | Status: AC
  Filled 2016-08-19: qty 1

## 2016-08-19 MED ORDER — ONDANSETRON HCL 4 MG/2ML IJ SOLN
INTRAMUSCULAR | Status: DC | PRN
  Administered 2016-08-19: 4 mg via INTRAVENOUS

## 2016-08-19 MED ORDER — MIDAZOLAM HCL 2 MG/2ML IJ SOLN
INTRAMUSCULAR | Status: AC
  Filled 2016-08-19: qty 2

## 2016-08-19 MED ORDER — LIDOCAINE 2% (20 MG/ML) 5 ML SYRINGE
INTRAMUSCULAR | Status: DC | PRN
  Administered 2016-08-19: 60 mg via INTRAVENOUS

## 2016-08-19 MED ORDER — SCOPOLAMINE 1 MG/3DAYS TD PT72
MEDICATED_PATCH | TRANSDERMAL | Status: AC
  Filled 2016-08-19: qty 1

## 2016-08-19 MED ORDER — PROPOFOL 10 MG/ML IV BOLUS
INTRAVENOUS | Status: AC
  Filled 2016-08-19: qty 20

## 2016-08-19 SURGICAL SUPPLY — 31 items
ABLATOR ENDOMETRIAL BIPOLAR (ABLATOR) ×3 IMPLANT
CANISTER SUCT 3000ML PPV (MISCELLANEOUS) ×3 IMPLANT
CATH ROBINSON RED A/P 16FR (CATHETERS) ×3 IMPLANT
COVER BACK TABLE 60X90IN (DRAPES) ×3 IMPLANT
DEVICE MYOSURE LITE (MISCELLANEOUS) IMPLANT
DEVICE MYOSURE REACH (MISCELLANEOUS) IMPLANT
DRAPE HYSTEROSCOPY (DRAPE) ×3 IMPLANT
DRAPE LG THREE QUARTER DISP (DRAPES) ×3 IMPLANT
ELECT REM PT RETURN 9FT ADLT (ELECTROSURGICAL)
ELECTRODE REM PT RTRN 9FT ADLT (ELECTROSURGICAL) IMPLANT
GLOVE BIO SURGEON STRL SZ 6.5 (GLOVE) ×2 IMPLANT
GLOVE BIO SURGEONS STRL SZ 6.5 (GLOVE) ×1
GLOVE BIOGEL PI IND STRL 7.0 (GLOVE) ×1 IMPLANT
GLOVE BIOGEL PI INDICATOR 7.0 (GLOVE) ×2
GOWN STRL REUS W/ TWL LRG LVL3 (GOWN DISPOSABLE) ×1 IMPLANT
GOWN STRL REUS W/TWL LRG LVL3 (GOWN DISPOSABLE) ×3
KIT RM TURNOVER CYSTO AR (KITS) ×3 IMPLANT
LEGGING LITHOTOMY PAIR STRL (DRAPES) ×3 IMPLANT
MYOSURE XL FIBROID REM (MISCELLANEOUS)
NEEDLE SPNL 22GX3.5 QUINCKE BK (NEEDLE) ×3 IMPLANT
PACK BASIN DAY SURGERY FS (CUSTOM PROCEDURE TRAY) ×3 IMPLANT
PAD OB MATERNITY 4.3X12.25 (PERSONAL CARE ITEMS) ×3 IMPLANT
PAD PREP 24X48 CUFFED NSTRL (MISCELLANEOUS) ×3 IMPLANT
SEAL ROD LENS SCOPE MYOSURE (ABLATOR) ×3 IMPLANT
SYR CONTROL 10ML LL (SYRINGE) ×3 IMPLANT
SYSTEM TISS REMOVAL MYSR XL RM (MISCELLANEOUS) IMPLANT
TOWEL OR 17X24 6PK STRL BLUE (TOWEL DISPOSABLE) ×6 IMPLANT
TRAY DSU PREP LF (CUSTOM PROCEDURE TRAY) ×3 IMPLANT
TUBING AQUILEX INFLOW (TUBING) ×3 IMPLANT
TUBING AQUILEX OUTFLOW (TUBING) ×3 IMPLANT
WATER STERILE IRR 500ML POUR (IV SOLUTION) ×3 IMPLANT

## 2016-08-19 NOTE — Anesthesia Procedure Notes (Signed)
Procedure Name: LMA Insertion Date/Time: 08/19/2016 7:37 AM Performed by: Denna Haggard D Pre-anesthesia Checklist: Patient identified, Emergency Drugs available, Suction available and Patient being monitored Patient Re-evaluated:Patient Re-evaluated prior to inductionOxygen Delivery Method: Circle system utilized Preoxygenation: Pre-oxygenation with 100% oxygen Intubation Type: IV induction Ventilation: Mask ventilation without difficulty LMA: LMA inserted LMA Size: 4.0 Number of attempts: 1 Airway Equipment and Method: Bite block Placement Confirmation: positive ETCO2 Tube secured with: Tape Dental Injury: Teeth and Oropharynx as per pre-operative assessment

## 2016-08-19 NOTE — Anesthesia Postprocedure Evaluation (Signed)
Anesthesia Post Note  Patient: SHALITA NOTTE  Procedure(s) Performed: Procedure(s) (LRB): HYSTEROSCOPY WITH NOVASURE (N/A)  Patient location during evaluation: PACU Anesthesia Type: General Level of consciousness: awake and alert Pain management: pain level controlled Vital Signs Assessment: post-procedure vital signs reviewed and stable Respiratory status: spontaneous breathing, nonlabored ventilation, respiratory function stable and patient connected to nasal cannula oxygen Cardiovascular status: blood pressure returned to baseline and stable Postop Assessment: no signs of nausea or vomiting Anesthetic complications: no       Last Vitals:  Vitals:   08/19/16 0845 08/19/16 0900  BP: (!) 157/90 (!) 147/85  Pulse: 87 88  Resp: 15 14  Temp:      Last Pain:  Vitals:   08/19/16 0609  TempSrc: Oral                 Blessed Girdner EDWARD

## 2016-08-19 NOTE — Procedures (Signed)
Operative Note    Preoperative Diagnosis Menorrhagia Multiple Medical comorbidities  Postoperative Diagnosis same  Procedure Hysteroscopy with Novasure Ablation  Surgeon Paula Compton, MD  Anesthesia LMA  Fluids: EBL 32mL UOP less than 75mL IVF 859mL  Findings Normal uterine cavity. Cavity length 6.5+ and width 4.1.   Treatment time 1 min 4 sec  Specimen none  Procedure Note Patient was taken to the operating room where LMA anesthesia was obtained without difficulty. She was then prepped and draped in the normal sterile fashion in the dorsal lithotomy position. An appropriate time out was performed. A speculum was then placed within the vagina and the anterior lip of the cervix identified and injected with approximately 2 cc of 1% plain lidocaine. An additional 9 cc each was placed at 2 and 10:00 for a paracervical block. Uterus was then sounded to 11 cm and the cervical length measured at 3.5 cm.  The Pratt dilators utilized to dilate the cervix up to approximately 21. The hysterosscope was introduced into the cavity and the findings noted as previously stated.  The hysteroscope was then removed and the Novasure device inserted to the top of the fundus and deployed.  A cavity width of 4.1 noted.  The device was activated with a treatment time of 90 secs. The hysteroscope was then replaced and the cavity noted to have a good treatment effect with blanching and no viable endometrium apparent.  All instruments were removed from the vagina.  The tenaculum site was hemostatic.  Finally the speculum was removed from the vagina and the patient awakened and taken to the recovery room in good condition.

## 2016-08-19 NOTE — Transfer of Care (Signed)
Immediate Anesthesia Transfer of Care Note  Patient: Linda Ellison  Procedure(s) Performed: Procedure(s) (LRB): HYSTEROSCOPY WITH NOVASURE (N/A)  Patient Location: PACU  Anesthesia Type: General  Level of Consciousness: awake, oriented, sedated and patient cooperative  Airway & Oxygen Therapy: Patient Spontanous Breathing and Patient connected to face mask oxygen  Post-op Assessment: Report given to PACU RN and Post -op Vital signs reviewed and stable  Post vital signs: Reviewed and stable  Complications: No apparent anesthesia complications  Last Vitals:  Vitals:   08/19/16 0609 08/19/16 0815  BP: (!) 149/90 (!) 175/99  Pulse: 100 93  Resp: 16 (!) 9  Temp: 37.1 C 36.7 C    Last Pain:  Vitals:   08/19/16 0609  TempSrc: Oral

## 2016-08-19 NOTE — Progress Notes (Signed)
Patient ID: Linda Ellison, female   DOB: [birth year], 41 y.o.   MRN: [id number] Per pt no changes in dictated H&P.  Had dialysis yesterday and I stat ok this AM.  D/w pt post-opertative pain management and states has taken dilaudid in past and can do that, but we discussed using it sparingly.   Brief exam WNL. Ready to proceed

## 2016-08-19 NOTE — Discharge Instructions (Signed)
° °  D & C Home care Instructions:   Personal hygiene:  Used sanitary napkins for vaginal drainage not tampons. Shower or tub bathe the day after your procedure. No douching until bleeding stops. Always wipe from front to back after  Elimination.  Activity: Do not drive or operate any equipment today. The effects of the anesthesia are still present and drowsiness may result. Rest today, not necessarily flat bed rest, just take it easy. You may resume your normal activity in one to 2 days.  Sexual activity: No intercourse for one week or as indicated by your physician  Diet: Eat a light diet as desired this evening. You may resume a regular diet tomorrow.  Return to work: One to 2 days.  General Expectations of your surgery: Vaginal bleeding should be no heavier than a normal period. Spotting may continue up to 10 days. Mild cramps may continue for a couple of days. You may have a regular period in 2-6 weeks.  Unexpected observations call your doctor if these occur: persistent or heavy bleeding. Severe abdominal cramping or pain. Elevation of temperature greater than 100F.  Call for an appointment in one week.    Patient's Signature_______________________________________________________  Nurse's Signature________________________________________________________   Post Anesthesia Home Care Instructions  Activity: Get plenty of rest for the remainder of the day. A responsible individual must stay with you for 24 hours following the procedure.  For the next 24 hours, DO NOT: -Drive a car -Paediatric nurse -Drink alcoholic beverages -Take any medication unless instructed by your physician -Make any legal decisions or sign important papers.  Meals: Start with liquid foods such as gelatin or soup. Progress to regular foods as tolerated. Avoid greasy, spicy, heavy foods. If nausea and/or vomiting occur, drink only clear liquids until the nausea and/or vomiting subsides. Call your  physician if vomiting continues.  Special Instructions/Symptoms: Your throat may feel dry or sore from the anesthesia or the breathing tube placed in your throat during surgery. If this causes discomfort, gargle with warm salt water. The discomfort should disappear within 24 hours.  If you had a scopolamine patch placed behind your ear for the management of post- operative nausea and/or vomiting:  1. The medication in the patch is effective for 72 hours, after which it should be removed.  Wrap patch in a tissue and discard in the trash. Wash hands thoroughly with soap and water. 2. You may remove the patch earlier than 72 hours if you experience unpleasant side effects which may include dry mouth, dizziness or visual disturbances. 3. Avoid touching the patch. Wash your hands with soap and water after contact with the patch.

## 2016-08-20 ENCOUNTER — Encounter (HOSPITAL_BASED_OUTPATIENT_CLINIC_OR_DEPARTMENT_OTHER): Payer: Self-pay | Admitting: Obstetrics and Gynecology

## 2016-09-08 ENCOUNTER — Ambulatory Visit: Payer: Self-pay | Admitting: Internal Medicine

## 2016-09-18 HISTORY — PX: KIDNEY TRANSPLANT: SHX239

## 2016-10-15 NOTE — Anesthesia Postprocedure Evaluation (Signed)
Anesthesia Post Note  Patient: Linda Ellison  Procedure(s) Performed: Procedure(s) (LRB): HYSTEROSCOPY WITH NOVASURE (N/A)     Anesthesia Post Evaluation  Last Vitals:  Vitals:   08/19/16 1045 08/19/16 1148  BP: (!) 148/90 (!) 164/100  Pulse: 91 91  Resp: 15 16  Temp:  36.9 C    Last Pain:  Vitals:   08/20/16 0958  TempSrc:   PainSc: 6                  Dantrell Schertzer EDWARD

## 2016-10-15 NOTE — Addendum Note (Signed)
Addendum  created 10/15/16 1300 by Lyndle Herrlich, MD   Sign clinical note

## 2016-10-21 ENCOUNTER — Ambulatory Visit (INDEPENDENT_AMBULATORY_CARE_PROVIDER_SITE_OTHER): Payer: Medicare Other | Admitting: Orthopedic Surgery

## 2016-10-25 DIAGNOSIS — D849 Immunodeficiency, unspecified: Secondary | ICD-10-CM | POA: Diagnosis present

## 2016-11-01 ENCOUNTER — Other Ambulatory Visit: Payer: Self-pay | Admitting: Gastroenterology

## 2016-11-01 NOTE — Telephone Encounter (Signed)
Saw Anderson Malta in March, please advise if okay to refill Sir, thank you.

## 2016-11-04 NOTE — Telephone Encounter (Signed)
She needs to make an appointment Once she does may refill up until that time

## 2016-11-04 NOTE — Telephone Encounter (Signed)
Unable to reach by phone, rings like a busy, busy noise, no voice mail.  I will send this one back to pharmacy and put the message in there that needs appointment and hopefully she will call.

## 2016-11-06 DIAGNOSIS — E872 Acidosis, unspecified: Secondary | ICD-10-CM | POA: Insufficient documentation

## 2016-11-29 ENCOUNTER — Encounter: Payer: Self-pay | Admitting: Internal Medicine

## 2016-12-08 ENCOUNTER — Encounter: Payer: Self-pay | Admitting: Internal Medicine

## 2016-12-08 DIAGNOSIS — L03114 Cellulitis of left upper limb: Secondary | ICD-10-CM | POA: Insufficient documentation

## 2017-01-07 ENCOUNTER — Telehealth: Payer: Self-pay | Admitting: *Deleted

## 2017-01-07 NOTE — Telephone Encounter (Signed)
noted 

## 2017-01-07 NOTE — Telephone Encounter (Signed)
Direct colon ok

## 2017-01-07 NOTE — Telephone Encounter (Signed)
Dr Carlean Purl:  Malen Gauze chart for PV scheduled for 10/18 and recall colonoscopy scheduled for 02/03/17.  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  Do you want her to have OV or delay recall colonoscopy?  Thanks, Juliann Pulse in [facility]

## 2017-01-20 ENCOUNTER — Telehealth: Payer: Self-pay

## 2017-01-20 NOTE — Telephone Encounter (Signed)
Numerous attempts to contact patient when she did not show up for her Pre-Visit. I will continue to try to call and reschedule her appointment. If I am not able to reach her by 5:00 today. I will cancel her scheduled colonoscopy per [facility] protocol. A letter will be mailed to her home. Patient's line has been busy every time that I have called so I am not able to leave a message.   Riki Sheer, LPN ( PV )

## 2017-01-28 ENCOUNTER — Encounter: Payer: Self-pay | Admitting: Internal Medicine

## 2017-02-03 ENCOUNTER — Encounter: Payer: Self-pay | Admitting: Internal Medicine

## 2017-02-15 ENCOUNTER — Ambulatory Visit (AMBULATORY_SURGERY_CENTER): Payer: Self-pay | Admitting: *Deleted

## 2017-02-15 ENCOUNTER — Other Ambulatory Visit: Payer: Self-pay

## 2017-02-15 VITALS — Ht 66.0 in | Wt 231.0 lb

## 2017-02-15 DIAGNOSIS — Z86010 Personal history of colonic polyps: Secondary | ICD-10-CM

## 2017-02-15 NOTE — Progress Notes (Addendum)
No egg or soy allergy known to patient  No issues with past sedation with any surgeries  or procedures, no intubation problems  No diet pills per patient No home 02 use per patient  No blood thinners per patient  Pt denies issues with constipation  No A fib or A flutter  EMMI video sent to pt's e mail   Pt. Brought med list in and all entered on 02/18/17  B. Benitez, CMA PV

## 2017-02-18 ENCOUNTER — Telehealth: Payer: Self-pay | Admitting: *Deleted

## 2017-02-18 NOTE — Telephone Encounter (Signed)
Called patient to remind her to bring her med list to the office so I can input them in her chart.    She was supposed to bring them on Wednesday the 11/14.  I accidentally deleted all of the medications I entered at her PV.  I want to put them in before she comes in for her procedure on 11/26 because here are a lot of them.  I attempted to call patient's numbers but it would not go through.  I then called her second contact [name] and spoke with him.  He stated that they forgot and he will have patient bring list.  B.Savvas Roper, CMA  PV

## 2017-02-18 NOTE — Addendum Note (Signed)
Addended by: Ronelle Nigh on: 02/18/2017 12:59 PM   Modules accepted: Orders

## 2017-02-28 ENCOUNTER — Other Ambulatory Visit: Payer: Self-pay

## 2017-02-28 ENCOUNTER — Ambulatory Visit (AMBULATORY_SURGERY_CENTER): Payer: Medicare Other | Admitting: Internal Medicine

## 2017-02-28 ENCOUNTER — Encounter: Payer: Self-pay | Admitting: Internal Medicine

## 2017-02-28 VITALS — BP 203/99 | HR 81 | Temp 97.5°F | Resp 17 | Ht 66.0 in | Wt 203.0 lb

## 2017-02-28 DIAGNOSIS — Z86010 Personal history of colonic polyps: Secondary | ICD-10-CM | POA: Diagnosis not present

## 2017-02-28 MED ORDER — SODIUM CHLORIDE 0.9 % IV SOLN: 500.0000 mL | INTRAVENOUS | Status: DC

## 2017-02-28 NOTE — Progress Notes (Signed)
Pt's states no medical or surgical changes since previsit or office visit. 

## 2017-02-28 NOTE — Patient Instructions (Addendum)
   No polyps today. Your next routine colonoscopy should be in 5 years - 2023.  I appreciate the opportunity to care for you. Gatha Mayer, MD, FACG  YOU HAD AN ENDOSCOPIC PROCEDURE TODAY AT [facility]:   Refer to the procedure report that was given to you for any specific questions about what was found during the examination.  If the procedure report does not answer your questions, please call your gastroenterologist to clarify.  If you requested that your care partner not be given the details of your procedure findings, then the procedure report has been included in a sealed envelope for you to review at your convenience later.  YOU SHOULD EXPECT: Some feelings of bloating in the abdomen. Passage of more gas than usual.  Walking can help get rid of the air that was put into your GI tract during the procedure and reduce the bloating. If you had a lower endoscopy (such as a colonoscopy or flexible sigmoidoscopy) you may notice spotting of blood in your stool or on the toilet paper. If you underwent a bowel prep for your procedure, you may not have a normal bowel movement for a few days.  Please Note:  You might notice some irritation and congestion in your nose or some drainage.  This is from the oxygen used during your procedure.  There is no need for concern and it should clear up in a day or so.  SYMPTOMS TO REPORT IMMEDIATELY:   Following lower endoscopy (colonoscopy or flexible sigmoidoscopy):  Excessive amounts of blood in the stool  Significant tenderness or worsening of abdominal pains  Swelling of the abdomen that is new, acute  Fever of 100F or higher  For urgent or emergent issues, a gastroenterologist can be reached at any hour by calling [phone].   DIET:  We do recommend a small meal at first, but then you may proceed to your regular diet.  Drink plenty of fluids but you should avoid alcoholic beverages for 24 hours.  ACTIVITY:  You should  plan to take it easy for the rest of today and you should NOT DRIVE or use heavy machinery until tomorrow (because of the sedation medicines used during the test).    FOLLOW UP: Our staff will call the number listed on your records the next business day following your procedure to check on you and address any questions or concerns that you may have regarding the information given to you following your procedure. If we do not reach you, we will leave a message.  However, if you are feeling well and you are not experiencing any problems, there is no need to return our call.  We will assume that you have returned to your regular daily activities without incident.  If any biopsies were taken you will be contacted by phone or by letter within the next 1-3 weeks.  Please call us at [phone] if you have not heard about the biopsies in 3 weeks.   Repeat screening  Colonoscopy in 5 years  SIGNATURES/CONFIDENTIALITY: You and/or your care partner have signed paperwork which will be entered into your electronic medical record.  These signatures attest to the fact that that the information above on your After Visit Summary has been reviewed and is understood.  Full responsibility of the confidentiality of this discharge information lies with you and/or your care-partner.

## 2017-02-28 NOTE — Op Note (Signed)
[facility] Patient Name: Linda Ellison Procedure Date: 02/28/2017 2:17 PM MRN: [id number] Endoscopist: Gatha Mayer , MD Age: 41 Referring MD:  Date of Birth: 07-Apr-1975 Gender: Female Account #: [id number] Procedure:                Colonoscopy Indications:              High risk colon cancer surveillance: Personal                            history of sessile serrated colon polyp (less than                            10 mm in size) with no dysplasia Medicines:                Propofol per Anesthesia, Monitored Anesthesia Care Procedure:                Pre-Anesthesia Assessment:                           - Prior to the procedure, a History and Physical                            was performed, and patient medications and                            allergies were reviewed. The patient's tolerance of                            previous anesthesia was also reviewed. The risks                            and benefits of the procedure and the sedation                            options and risks were discussed with the patient.                            All questions were answered, and informed consent                            was obtained. Prior Anticoagulants: The patient has                            taken no previous anticoagulant or antiplatelet                            agents. ASA Grade Assessment: III - A patient with                            severe systemic disease. After reviewing the risks                            and benefits, the patient was deemed in  satisfactory condition to undergo the procedure.                           After obtaining informed consent, the colonoscope                            was passed under direct vision. Throughout the                            procedure, the patient's blood pressure, pulse, and                            oxygen saturations were monitored continuously. The   Colonoscope was introduced through the anus and                            advanced to the the cecum, identified by                            appendiceal orifice and ileocecal valve. The                            colonoscopy was performed without difficulty. The                            patient tolerated the procedure well. The quality                            of the bowel preparation was adequate. The bowel                            preparation used was Miralax. The ileocecal valve,                            appendiceal orifice, and rectum were photographed. Scope In: 3:29:57 PM Scope Out: 3:42:01 PM Scope Withdrawal Time: 0 hours 8 minutes 40 seconds  Total Procedure Duration: 0 hours 12 minutes 4 seconds  Findings:                 The perianal and digital rectal examinations were                            normal.                           The entire examined colon appeared normal on direct                            and retroflexion views. Complications:            No immediate complications. Estimated Blood Loss:     Estimated blood loss: none. Impression:               - The entire examined colon is normal on direct and  retroflexion views.                           - No specimens collected. Recommendation:           - Patient has a contact number available for                            emergencies. The signs and symptoms of potential                            delayed complications were discussed with the                            patient. Return to normal activities tomorrow.                            Written discharge instructions were provided to the                            patient.                           - Resume previous diet.                           - Continue present medications.                           - Repeat colonoscopy in 5 years for surveillance. 6                            mm sessile serrated adenoma 5 yrs ago and is  also                            on immunosuppression so would repeat at 5 yrs not [place], MD 02/28/2017 3:49:10 PM This report has been signed electronically.

## 2017-03-01 ENCOUNTER — Telehealth: Payer: Self-pay | Admitting: *Deleted

## 2017-03-01 NOTE — Telephone Encounter (Signed)
  Follow up Call-  Call back number 02/28/2017  Post procedure Call Back phone  # [phone]  Permission to leave phone message Yes  Some recent data might be hidden     Patient questions:  Do you have a fever, pain , or abdominal swelling? No. Pain Score  0 *  Have you tolerated food without any problems? Yes.    Have you been able to return to your normal activities? Yes.    Do you have any questions about your discharge instructions: Diet   No. Medications  No. Follow up visit  No.  Do you have questions or concerns about your Care? No.  Actions: * If pain score is 4 or above: No action needed, pain <4.

## 2017-06-07 ENCOUNTER — Ambulatory Visit (INDEPENDENT_AMBULATORY_CARE_PROVIDER_SITE_OTHER): Payer: Medicare Other | Admitting: Vascular Surgery

## 2017-06-07 ENCOUNTER — Encounter (INDEPENDENT_AMBULATORY_CARE_PROVIDER_SITE_OTHER): Payer: Self-pay | Admitting: Vascular Surgery

## 2017-06-07 VITALS — BP 153/92 | HR 93 | Resp 17 | Ht 66.0 in | Wt 257.0 lb

## 2017-06-07 DIAGNOSIS — E1121 Type 2 diabetes mellitus with diabetic nephropathy: Secondary | ICD-10-CM | POA: Diagnosis not present

## 2017-06-07 DIAGNOSIS — N186 End stage renal disease: Secondary | ICD-10-CM

## 2017-06-07 DIAGNOSIS — Z992 Dependence on renal dialysis: Secondary | ICD-10-CM | POA: Diagnosis not present

## 2017-06-07 NOTE — Progress Notes (Signed)
Subjective:    Patient ID: Linda Ellison, female    DOB: April 07, 1975, 42 y.o.   MRN: [id number] Chief Complaint  Patient presents with  . Follow-up    Having pain   As per EPIC notation seems like the patient has been seen by Dr. Oneida Alar in the past.  The patient is status post kidney transplantation in July 2018.  The patient notes that she has never used her left upper extremity dialysis access.  The patient notes worsening left hand pain over the last week.  The patient notes that she has had "steal syndrome" in the past.  She feels that her symptoms are very similar to what she felt in the past while diagnosed with steal syndrome.  The patient denies any pale coloration or ulceration to the left hand.  Patient notes pain radiates from elbow down to hand.  The patient denies any fever, nausea vomiting.   Review of Systems  Constitutional: Negative.   HENT: Negative.   Eyes: Negative.   Respiratory: Negative.   Cardiovascular:       Left hand pain  Gastrointestinal: Negative.   Endocrine: Negative.   Genitourinary: Negative.   Musculoskeletal: Negative.   Skin: Negative.   Allergic/Immunologic: Negative.   Neurological: Negative.   Hematological: Negative.   Psychiatric/Behavioral: Negative.       Objective:   Physical Exam  Constitutional: She is oriented to person, place, and time. She appears well-developed and well-nourished. No distress.  HENT:  Head: Normocephalic and atraumatic.  Eyes: Conjunctivae are normal. Pupils are equal, round, and reactive to light.  Neck: Normal range of motion.  Cardiovascular: Normal rate, regular rhythm, normal heart sounds and intact distal pulses.  Pulses:      Radial pulses are 2+ on the right side, and 2+ on the left side.  Left upper extremity: Good bruit and thrill noted to left forearm access.  2+ radial pulses.  Hand is warm.  Motor/neuro intact to the left hand.  Pulmonary/Chest: Effort normal and breath sounds normal.    Musculoskeletal: Normal range of motion. She exhibits no edema.  Neurological: She is alert and oriented to person, place, and time.  Skin: Skin is warm and dry. She is not diaphoretic.  Psychiatric: She has a normal mood and affect. Her behavior is normal. Judgment and thought content normal.  Vitals reviewed.  BP (!) 153/92 (BP Location: Right Arm, Patient Position: Sitting)   Pulse 93   Resp 17   Ht 5\' 6"  (1.676 m)   Wt 257 lb (116.6 kg)   BMI 41.48 kg/m   Past Medical History:  Diagnosis Date  . Allergy   . Anemia associated with chronic renal failure   . Anxiety   . Asthma   . Atypical chest pain    long-standing -- normal cardio cath 06-27-2012 and normal nuclear stress test 06-25-2016  . AV (arteriovenous fistula) (HCC)    for dialysis-- currently located left radiocephalic  . CAD (coronary artery disease) cardiologist-- dr Clydie Braun ([facility] in [place])   a. False positive stress echo 06/2012 at [facility] - cath with no obstructive CAD at [facility] (mild luminal irregularities in LAD, moderate diffuse disease in distal RCA).  . Depression   . ESRD on hemodialysis [facility]) NEPHROLOGIST-  DR MATTINGLY   started dialysis 01/29/11: [place] on MWF  . Gastroparesis   . GERD (gastroesophageal reflux disease)   . History of pneumonia    HCAP 04/ 2017  .  Hyperlipidemia   . Hyperthyroidism   . Menorrhagia   . Other secondary hypertension    associated to diabetes -- followed by cardiologist ( dr Clydie Braun)    . Peripheral neuropathy   . PONV (postoperative nausea and vomiting)   . Pre-transplant evaluation for kidney transplant    [facility]  . Secondary hyperparathyroidism of renal origin ([place])   . Type 2 diabetes mellitus, with long-term current use of insulin (HCC)    dx 1985   Social History   Socioeconomic History  . Marital status: Single    Spouse name: Not on file  . Number of children: 2   . Years of education: Not on file  . Highest education level: Not on file  Social Needs  . Financial resource strain: Not on file  . Food insecurity - worry: Not on file  . Food insecurity - inability: Not on file  . Transportation needs - medical: Not on file  . Transportation needs - non-medical: Not on file  Occupational History  . Occupation: Unemployed  Tobacco Use  . Smoking status: Never Smoker  . Smokeless tobacco: Never Used  Substance and Sexual Activity  . Alcohol use: No    Alcohol/week: 0.0 oz  . Drug use: No  . Sexual activity: Not on file    Comment: BTL  Other Topics Concern  . Not on file  Social History Narrative   Disabled 2/2 to dialysis   Used to be a Consulting civil engineer at [employer]   Has 2 kids   Lives in [place]   No caffeine    Past Surgical History:  Procedure Laterality Date  . AV FISTULA PLACEMENT  08/2010   Left radiocephalic AVF  . AV FISTULA PLACEMENT Right 05/07/2014   Procedure: ARTERIOVENOUS (AV) FISTULA CREATION;  Surgeon: Elam Dutch, MD;  Location: [facility] OR;  Service: Vascular;  Laterality: Right;  . AV FISTULA PLACEMENT Right 07/02/2014   Procedure: INSERTION OF ARTERIOVENOUS (AV) GORE-TEX GRAFT ARM;  Surgeon: Elam Dutch, MD;  Location: [place];  Service: Vascular;  Laterality: Right;  . CARDIOVASCULAR STRESS TEST  06/25/2016   [place]   normal nuclear perfusion study w/ no ischemia/  normal LV function and wall motion , ef 51%  . CESAREAN SECTION  03/22/2001   w/ Bilateral Tubal Ligation  . COLONOSCOPY    . COLONOSCOPY WITH ESOPHAGOGASTRODUODENOSCOPY (EGD)  10/19/2011  . DILATION AND CURETTAGE OF UTERUS  05/12/2000   w/ suction for missed ab  . DOBUTAMINE STRESS ECHO  06/04/2016    [facility]   normal stress echo w/ no chest pain or ischemia/  normal LV function and wall motion , stress ef 60-65%  . ESOPHAGOGASTRODUODENOSCOPY  10/19/2011   Dr. Silvano Rusk  . FEMUR IM NAIL Left child   removed  . FOOT SURGERY Left 2014 approx.  Marland Kitchen  HYSTEROSCOPY WITH NOVASURE N/A 08/19/2016   Procedure: HYSTEROSCOPY WITH NOVASURE;  Surgeon: Paula Compton, MD;  Location: [facility];  Service: Gynecology;  Laterality: N/A;  . [place]  . LEFT HEART CATHETERIZATION WITH CORONARY ANGIOGRAM N/A 06/27/2012   Procedure: LEFT HEART CATHETERIZATION WITH CORONARY ANGIOGRAM;  Surgeon: Larey Dresser, MD;  Location: [facility] CATH LAB;  Service: Cardiovascular;  Laterality: N/A; No ostructive CAD, normal LVF, ef 55-60% (mild LAD luminal irregularities, moderate dRCA diffuse disease)  . LIGATION GORETEX FISTULA  01/04/11   Left AVF  . LIGATION OF ARTERIOVENOUS  FISTULA Left 08/21/2014  Procedure: LIGATION OF ARTERIOVENOUS  FISTULA;  Surgeon: Algernon Huxley, MD;  Location: [facility] ORS;  Service: Vascular;  Laterality: Left;  . PERIPHERAL VASCULAR CATHETERIZATION Left 08/08/2014   Procedure: Upper Extremity Angiography;  Surgeon: Algernon Huxley, MD;  Location: [place] CV LAB;  Service: Cardiovascular;  Laterality: Left;  . PERIPHERAL VASCULAR CATHETERIZATION Left 08/08/2014   Procedure: Upper Extremity Intervention;  Surgeon: Algernon Huxley, MD;  Location: [place] CV LAB;  Service: Cardiovascular;  Laterality: Left;  . PERIPHERAL VASCULAR CATHETERIZATION Left 03/08/2016   Procedure: A/V Fistulagram;  Surgeon: Algernon Huxley, MD;  Location: [place] CV LAB;  Service: Cardiovascular;  Laterality: Left;  . POLYPECTOMY    . THROMBECTOMY AND REVISION OF ARTERIOVENTOUS (AV) GORETEX  GRAFT Right 07/16/2014   Procedure: THROMBECTOMY  Right  arm  ARTERIOVENOUS  GORETEX  GRAFT;  Surgeon: Elam Dutch, MD;  Location: [place];  Service: Vascular;  Laterality: Right;  . TRANSTHORACIC ECHOCARDIOGRAM  06/04/2016   mild concentric LVH, ef 55-60%,  mild TR,  borderline LAE,  trivial MR and PR   Family History  Problem Relation Age of Onset  . Heart disease Mother        heart attack at 31 y/o-infected valve  . Kidney disease Mother         was a dialysis patient  . Heart disease Brother   . Kidney disease Maternal Grandmother        pre-dialysis  . Diabetes Father   . Hypertension Father   . Colon cancer Paternal Grandmother   . Colon polyps Paternal Grandmother   . Esophageal cancer Paternal Grandmother   . Rectal cancer Paternal Grandmother   . Stomach cancer Paternal Grandmother   . Kidney failure Paternal Uncle   . Kidney failure Paternal Aunt   . Arthritis Brother   . Diabetes Maternal Aunt   . Hypertension Maternal Aunt   . Diabetes Paternal Aunt   . Heart disease Paternal Aunt   . Hypertension Paternal Aunt    Allergies  Allergen Reactions  . Ciprofloxacin Hives, Itching and Nausea And Vomiting  . Fluocinolone Itching, Nausea And Vomiting and Other (See Comments)  . Hydromorphone Hives  . Pineapple Itching and Other (See Comments)    Pt states that this medication makes her tongue raw.    . Strawberry Extract Itching and Other (See Comments)    Pt states that this medication makes her tongue raw.    . Phenytoin Sodium Extended Itching  . Tylenol [Acetaminophen] Itching  . Adhesive [Tape] Itching and Rash  . Chlorhexidine Gluconate Itching  . Clindamycin Diarrhea, Nausea And Vomiting and Rash  . Oxycodone Nausea And Vomiting, Rash and Other (See Comments)    Reaction:  Hallucinations   . Shrimp [Shellfish Allergy] Rash      Assessment & Plan:  As per EPIC notation seems like the patient has been seen by Dr. Oneida Alar in the past.  The patient is status post kidney transplantation in July 2018.  The patient notes that she has never used her left upper extremity dialysis access.  The patient notes worsening left hand pain over the last week.  The patient notes that she has had "steal syndrome" in the past.  She feels that her symptoms are very similar to what she felt in the past while diagnosed with steal syndrome.  The patient denies any pale coloration or ulceration to the left hand.  Patient notes  pain radiates from elbow down to hand.  The patient denies any fever, nausea vomiting.  1. ESRD (end stage renal disease) on dialysis ([place]) - Stable As per epic notation the patient seems to have had most of her care with Dr. Oneida Alar Patient notes that she has been diagnosed with steal syndrome to the left upper extremity in the past The patient is status post a kidney transplantation in July 2018 The patient has never used her access for dialysis I will bring the patient back to undergo a steal study There is no acute compromise to the left upper extremity at this time  - VAS [place] STEAL EXAM; Future  2. Diabetes mellitus with nephropathy ([place]) - Stable Encouraged good control as its slows the progression of atherosclerotic disease  Current Outpatient Medications on File Prior to Visit  Medication Sig Dispense Refill  . albuterol (PROVENTIL HFA;VENTOLIN HFA) 108 (90 Base) MCG/ACT inhaler Inhale 2 puffs every 6 (six) hours as needed into the lungs for wheezing or shortness of breath.    Marland Kitchen amitriptyline (ELAVIL) 10 MG tablet Take 10 mg at bedtime by mouth.    Marland Kitchen amLODipine (NORVASC) 5 MG tablet Take 5 mg daily by mouth.    Marland Kitchen aspirin EC 81 MG tablet Take 81 mg daily by mouth.    . B-D ULTRAFINE III SHORT PEN 31G X 8 MM MISC USE TO INJECT INSULIN  5  . buPROPion (WELLBUTRIN XL) 150 MG 24 hr tablet     . BuPROPion HBr 174 MG TB24 Take 1 tablet daily by mouth.    . cetirizine (ZYRTEC) 10 MG tablet Take 10 mg daily by mouth.    . citalopram (CELEXA) 40 MG tablet Take 40 mg daily by mouth.    . cloNIDine (CATAPRES) 0.1 MG tablet Take 0.1 mg 2 (two) times daily by mouth.    . furosemide (LASIX) 20 MG tablet Take 20 mg by mouth.    . gabapentin (NEURONTIN) 300 MG capsule Take 300 mg 3 (three) times daily by mouth.    . insulin aspart (NOVOLOG) 100 UNIT/ML FlexPen Inject 3 (three) times daily with meals into the skin. 8 units with breakfast, 7 units with lunch and 9 units with supper    . insulin  glargine (LANTUS) 100 UNIT/ML injection Inject 22 Units at bedtime into the skin.    Marland Kitchen labetalol (NORMODYNE) 100 MG tablet TAKE 2 TABLETS BY MOUTH EVERY 8 HOURS  11  . magnesium oxide (MAG-OX) 400 MG tablet Take 400 mg daily by mouth. Take at noon    . montelukast (SINGULAIR) 10 MG tablet Take 10 mg at bedtime by mouth.    [place] Abide Compounding Base (MOUTHWASH-AF PO) Take 30 mLs 4 (four) times daily by mouth. musositis mouthwash-viscous lidocaine    . mycophenolate (MYFORTIC) 180 MG EC tablet Take 720 mg 2 (two) times daily by mouth.    Marland Kitchen omeprazole (PRILOSEC) 20 MG capsule Take 20 mg daily by mouth.    . predniSONE (DELTASONE) 5 MG tablet Take 5 mg daily with breakfast by mouth.    . simvastatin (ZOCOR) 5 MG tablet Take 5 mg daily by mouth.    . sodium bicarbonate 650 MG tablet Take 1,300 mg 3 (three) times daily by mouth.    . sodium polystyrene (KAYEXALATE) powder sodium polystyrene sulfonate oral powder    . sulfamethoxazole-trimethoprim (BACTRIM,SEPTRA) 400-80 MG tablet Take 1 tablet 3 (three) times a week by mouth. Monday, Wednesday, and Friday    . tacrolimus (PROGRAF) 1 MG capsule Take 4 mg 2 (two) times  daily by mouth.    . Insulin Detemir (LEVEMIR FLEXTOUCH) 100 UNIT/ML Pen Levemir FlexTouch U-100 Insulin 100 unit/mL (3 mL) subcutaneous pen    . labetalol (NORMODYNE) 200 MG tablet Take 200 mg 2 (two) times daily by mouth.     No current facility-administered medications on file prior to visit.    There are no Patient Instructions on file for this visit. No Follow-up on file.  Golden Gilreath A Itati Brocksmith, PA-C

## 2017-06-14 DIAGNOSIS — B259 Cytomegaloviral disease, unspecified: Secondary | ICD-10-CM | POA: Insufficient documentation

## 2017-06-16 ENCOUNTER — Ambulatory Visit (INDEPENDENT_AMBULATORY_CARE_PROVIDER_SITE_OTHER): Payer: Medicare Other

## 2017-06-16 ENCOUNTER — Ambulatory Visit (INDEPENDENT_AMBULATORY_CARE_PROVIDER_SITE_OTHER): Payer: Medicare Other | Admitting: Vascular Surgery

## 2017-06-16 ENCOUNTER — Encounter (INDEPENDENT_AMBULATORY_CARE_PROVIDER_SITE_OTHER): Payer: Self-pay | Admitting: Vascular Surgery

## 2017-06-16 ENCOUNTER — Other Ambulatory Visit (INDEPENDENT_AMBULATORY_CARE_PROVIDER_SITE_OTHER): Payer: Self-pay | Admitting: Vascular Surgery

## 2017-06-16 VITALS — BP 152/93 | HR 90 | Resp 18 | Ht 66.0 in | Wt 258.0 lb

## 2017-06-16 DIAGNOSIS — Z940 Kidney transplant status: Secondary | ICD-10-CM | POA: Diagnosis not present

## 2017-06-16 DIAGNOSIS — Z992 Dependence on renal dialysis: Secondary | ICD-10-CM | POA: Diagnosis not present

## 2017-06-16 DIAGNOSIS — N186 End stage renal disease: Secondary | ICD-10-CM

## 2017-06-16 DIAGNOSIS — T82898A Other specified complication of vascular prosthetic devices, implants and grafts, initial encounter: Secondary | ICD-10-CM | POA: Diagnosis not present

## 2017-06-16 DIAGNOSIS — M79642 Pain in left hand: Secondary | ICD-10-CM

## 2017-06-16 MED ORDER — TRAMADOL HCL 50 MG PO TABS: 50.0000 mg | ORAL_TABLET | Freq: Four times a day (QID) | ORAL | 0 refills | Status: DC | PRN

## 2017-06-16 NOTE — Progress Notes (Signed)
Subjective:    Patient ID: Linda Ellison, female    DOB: [birth year], 42 y.o.   MRN: [id number] Chief Complaint  Patient presents with  . Follow-up    ultrasound   Patient presents to review vascular studies.  The patient was last seen on June 07, 2017 for evaluation of progressively worsening left hand pain.  The patient is status post a successful kidney transplant in July 2018.  The patient is currently not on any dialysis.  The patient has a left radiocephalic AV fistula.  The patient underwent a left upper extremity steal study which was notable for an occluded right radial artery distal to the anastomosis which may be due to her previous ligation of the left radial artery on Aug 21, 2014.  There was evidence of a significant fistula steal based on a greater than 30 mL/min in the bilateral digital pressures and a >34ml/min increase in left digit pressure during aVF compression.  Patent left radiocephalic AV fistula with no hemodynamically significant stenosis.  The patient denies any ulceration to the left hand.  Patient denies any fever, nausea vomiting.   Review of Systems  Constitutional: Negative.   HENT: Negative.   Eyes: Negative.   Respiratory: Negative.   Cardiovascular:       Left upper extremity radiocephalic AV fistula Left upper extremity steal syndrome  Gastrointestinal: Negative.   Endocrine: Negative.   Genitourinary: Negative.   Musculoskeletal: Negative.   Skin: Negative.   Allergic/Immunologic: Negative.   Neurological: Negative.   Hematological: Negative.   Psychiatric/Behavioral: Negative.       Objective:   Physical Exam  Constitutional: She is oriented to person, place, and time. She appears well-developed and well-nourished. No distress.  HENT:  Head: Normocephalic and atraumatic.  Eyes: Conjunctivae are normal. Pupils are equal, round, and reactive to light.  Neck: Normal range of motion.  Cardiovascular: Normal rate, regular rhythm, normal heart  sounds and intact distal pulses.  Pulses:      Radial pulses are 2+ on the right side, and 0 on the left side.  Left radiocephalic AV fistula: Good bruit and thrill.  And is intact.   Left hand is cooler when compared to the right. There is no ulceration to the left hand  Pulmonary/Chest: Effort normal and breath sounds normal.  Musculoskeletal: Normal range of motion. She exhibits no edema.  Neurological: She is alert and oriented to person, place, and time.  Skin: Skin is warm and dry. She is not diaphoretic.  Psychiatric: She has a normal mood and affect. Her behavior is normal. Judgment and thought content normal.  Vitals reviewed.  BP (!) 152/93   Pulse 90   Resp 18   Ht 5\' 6"  (1.676 m)   Wt 258 lb (117 kg)   BMI 41.64 kg/m   Past Medical History:  Diagnosis Date  . Allergy   . Anemia associated with chronic renal failure   . Anxiety   . Asthma   . Atypical chest pain    long-standing -- normal cardio cath 06-27-2012 and normal nuclear stress test 06-25-2016  . AV (arteriovenous fistula) (HCC)    for dialysis-- currently located left radiocephalic  . CAD (coronary artery disease) cardiologist-- dr Clydie Braun ([facility] in [place])   a. False positive stress echo 06/2012 at [facility] - cath with no obstructive CAD at [facility] (mild luminal irregularities in LAD, moderate diffuse disease in distal RCA).  . Depression   . ESRD on hemodialysis [facility]) NEPHROLOGIST-  DR MATTINGLY   started dialysis 01/29/11: [place] on MWF  . Gastroparesis   . GERD (gastroesophageal reflux disease)   . History of pneumonia    HCAP 04/ 2017  . Hyperlipidemia   . Hyperthyroidism   . Menorrhagia   . Other secondary hypertension    associated to diabetes -- followed by cardiologist ( dr Clydie Braun)    . Peripheral neuropathy   . PONV (postoperative nausea and vomiting)   . Pre-transplant evaluation for kidney transplant    [facility]  . Secondary hyperparathyroidism of renal origin ([place])   . Type 2 diabetes mellitus, with long-term current use of insulin (HCC)    dx 1985   Social History   Socioeconomic History  . Marital status: Single    Spouse name: Not on file  . Number of children: 2  . Years of education: Not on file  . Highest education level: Not on file  Social Needs  . Financial resource strain: Not on file  . Food insecurity - worry: Not on file  . Food insecurity - inability: Not on file  . Transportation needs - medical: Not on file  . Transportation needs - non-medical: Not on file  Occupational History  . Occupation: Unemployed  Tobacco Use  . Smoking status: Never Smoker  . Smokeless tobacco: Never Used  Substance and Sexual Activity  . Alcohol use: No    Alcohol/week: 0.0 oz  . Drug use: No  . Sexual activity: Not on file    Comment: BTL  Other Topics Concern  . Not on file  Social History Narrative   Disabled 2/2 to dialysis   Used to be a Consulting civil engineer at [employer]   Has 2 kids   Lives in [place]   No caffeine    Past Surgical History:  Procedure Laterality Date  . AV FISTULA PLACEMENT  08/2010   Left radiocephalic AVF  . AV FISTULA PLACEMENT Right 05/07/2014   Procedure: ARTERIOVENOUS (AV) FISTULA CREATION;  Surgeon: Elam Dutch, MD;  Location: [facility] OR;  Service: Vascular;  Laterality: Right;  . AV FISTULA PLACEMENT Right 07/02/2014   Procedure: INSERTION OF ARTERIOVENOUS (AV) GORE-TEX GRAFT ARM;  Surgeon: Elam Dutch, MD;  Location: [place];  Service: Vascular;  Laterality: Right;  . CARDIOVASCULAR STRESS TEST  06/25/2016   [place]   normal nuclear perfusion study w/ no ischemia/  normal LV function and wall motion , ef 51%  . CESAREAN SECTION  03/22/2001   w/ Bilateral Tubal Ligation  . COLONOSCOPY    . COLONOSCOPY WITH ESOPHAGOGASTRODUODENOSCOPY (EGD)  10/19/2011  . DILATION AND CURETTAGE OF UTERUS  05/12/2000   w/ suction for missed ab  .  DOBUTAMINE STRESS ECHO  06/04/2016    [facility]   normal stress echo w/ no chest pain or ischemia/  normal LV function and wall motion , stress ef 60-65%  . ESOPHAGOGASTRODUODENOSCOPY  10/19/2011   Dr. Silvano Rusk  . FEMUR IM NAIL Left child   removed  . FOOT SURGERY Left 2014 approx.  Marland Kitchen HYSTEROSCOPY WITH NOVASURE N/A 08/19/2016   Procedure: HYSTEROSCOPY WITH NOVASURE;  Surgeon: Paula Compton, MD;  Location[facility]  Service: Gynecology;  Laterality: N/A;  . KIDNEY TRANSPLANT  09/18/2016  . LAPAROSCOPIC CHOLECYSTECTOMY  1994  . LEFT HEART CATHETERIZATION WITH CORONARY ANGIOGRAM N/A 06/27/2012   Procedure: LEFT HEART CATHETERIZATION WITH CORONARY ANGIOGRAM;  Surgeon: Larey Dresser, MD;  Location: [facility] CATH  LAB;  Service: Cardiovascular;  Laterality: N/A; No ostructive CAD, normal LVF, ef 55-60% (mild LAD luminal irregularities, moderate dRCA diffuse disease)  . LIGATION GORETEX FISTULA  01/04/11   Left AVF  . LIGATION OF ARTERIOVENOUS  FISTULA Left 08/21/2014   Procedure: LIGATION OF ARTERIOVENOUS  FISTULA;  Surgeon: Algernon Huxley, MD;  Location: [facility] ORS;  Service: Vascular;  Laterality: Left;  . PERIPHERAL VASCULAR CATHETERIZATION Left 08/08/2014   Procedure: Upper Extremity Angiography;  Surgeon: Algernon Huxley, MD;  Location: [place] CV LAB;  Service: Cardiovascular;  Laterality: Left;  . PERIPHERAL VASCULAR CATHETERIZATION Left 08/08/2014   Procedure: Upper Extremity Intervention;  Surgeon: Algernon Huxley, MD;  Location: [place] CV LAB;  Service: Cardiovascular;  Laterality: Left;  . PERIPHERAL VASCULAR CATHETERIZATION Left 03/08/2016   Procedure: A/V Fistulagram;  Surgeon: Algernon Huxley, MD;  Location: [place] CV LAB;  Service: Cardiovascular;  Laterality: Left;  . POLYPECTOMY    . THROMBECTOMY AND REVISION OF ARTERIOVENTOUS (AV) GORETEX  GRAFT Right 07/16/2014   Procedure: THROMBECTOMY  Right  arm  ARTERIOVENOUS  GORETEX  GRAFT;  Surgeon: Elam Dutch, MD;   Location: [place];  Service: Vascular;  Laterality: Right;  . TRANSTHORACIC ECHOCARDIOGRAM  06/04/2016   mild concentric LVH, ef 55-60%,  mild TR,  borderline LAE,  trivial MR and PR   Family History  Problem Relation Age of Onset  . Heart disease Mother        heart attack at 7 y/o-infected valve  . Kidney disease Mother        was a dialysis patient  . Heart disease Brother   . Kidney disease Maternal Grandmother        pre-dialysis  . Diabetes Father   . Hypertension Father   . Colon cancer Paternal Grandmother   . Colon polyps Paternal Grandmother   . Esophageal cancer Paternal Grandmother   . Rectal cancer Paternal Grandmother   . Stomach cancer Paternal Grandmother   . Kidney failure Paternal Uncle   . Kidney failure Paternal Aunt   . Arthritis Brother   . Diabetes Maternal Aunt   . Hypertension Maternal Aunt   . Diabetes Paternal Aunt   . Heart disease Paternal Aunt   . Hypertension Paternal Aunt    Allergies  Allergen Reactions  . Ciprofloxacin Hives, Itching and Nausea And Vomiting  . Fluocinolone Itching, Nausea And Vomiting and Other (See Comments)  . Hydromorphone Hives  . Pineapple Itching and Other (See Comments)    Pt states that this medication makes her tongue raw.    . Strawberry Extract Itching and Other (See Comments)    Pt states that this medication makes her tongue raw.    . Phenytoin Sodium Extended Itching  . Tylenol [Acetaminophen] Itching  . Adhesive [Tape] Itching and Rash  . Chlorhexidine Gluconate Itching  . Clindamycin Diarrhea, Nausea And Vomiting and Rash  . Oxycodone Nausea And Vomiting, Rash and Other (See Comments)    Reaction:  Hallucinations   . Shrimp [Shellfish Allergy] Rash      Assessment & Plan:  Patient presents to review vascular studies.  The patient was last seen on June 07, 2017 for evaluation of progressively worsening left hand pain.  The patient is status post a successful kidney transplant in July 2018.  The patient  is currently not on any dialysis.  The patient has a left radiocephalic AV fistula.  The patient underwent a left upper extremity steal study which was notable for  an occluded right radial artery distal to the anastomosis which may be due to her previous ligation of the left radial artery on Aug 21, 2014.  There was evidence of a significant fistula steal based on a greater than 30 mL/min in the bilateral digital pressures and a >24ml/min increase in left digit pressure during aVF compression.  Patent left radiocephalic AV fistula with no hemodynamically significant stenosis.  The patient denies any ulceration to the left hand.  Patient denies any fever, nausea vomiting.  1. Steal syndrome as complication of dialysis access, initial encounter ([place]) - Stable Patient with progressively worsening pain to her left hand Patient with a left radiocephalic AV fistula which she is no longer using due to a successful kidney transplant in July 2018 Patient with steal noted on duplex today Patient given tramadol 50 mg 1 tab every 6 hours as needed for pain #40 for discomfort Recommend ligation of the patient's left radiocephalic AV fistula Due to the patient's kidney transplant status it would be high risk due to the dye load needed for a successful angiogram / fistulogram of injuring the patient's kidney Procedure, risks and benefits explained to the patient The patient expresses her understanding and would like to proceed with surgery  2. Kidney transplant recipient - Stable The patient no longer uses her left upper extremity radiocephalic AV fistula due to successful kidney transplant in July 2018 The patient has progressively worsening pain to the left hand now with steal identified on duplex Recommend ligation of the left radiocephalic AV fistula as the patient is not a candidate to undergo any endovascular procedure due to the dye load injuring her kidney status The patient understands this  Current  Outpatient Medications on File Prior to Visit  Medication Sig Dispense Refill  . albuterol (PROVENTIL HFA;VENTOLIN HFA) 108 (90 Base) MCG/ACT inhaler Inhale 2 puffs every 6 (six) hours as needed into the lungs for wheezing or shortness of breath.    Marland Kitchen amitriptyline (ELAVIL) 10 MG tablet Take 10 mg at bedtime by mouth.    Marland Kitchen amLODipine (NORVASC) 5 MG tablet Take 5 mg daily by mouth.    Marland Kitchen aspirin EC 81 MG tablet Take 81 mg daily by mouth.    . B-D ULTRAFINE III SHORT PEN 31G X 8 MM MISC USE TO INJECT INSULIN  5  . buPROPion (WELLBUTRIN XL) 150 MG 24 hr tablet     . BuPROPion HBr 174 MG TB24 Take 1 tablet daily by mouth.    . cetirizine (ZYRTEC) 10 MG tablet Take 10 mg daily by mouth.    . citalopram (CELEXA) 40 MG tablet Take 40 mg daily by mouth.    . cloNIDine (CATAPRES) 0.1 MG tablet Take 0.1 mg 2 (two) times daily by mouth.    . furosemide (LASIX) 20 MG tablet Take 20 mg by mouth.    . gabapentin (NEURONTIN) 300 MG capsule Take 300 mg 3 (three) times daily by mouth.    . insulin aspart (NOVOLOG) 100 UNIT/ML FlexPen Inject 3 (three) times daily with meals into the skin. 8 units with breakfast, 7 units with lunch and 9 units with supper    . insulin glargine (LANTUS) 100 UNIT/ML injection Inject 22 Units at bedtime into the skin.    Marland Kitchen labetalol (NORMODYNE) 100 MG tablet TAKE 2 TABLETS BY MOUTH EVERY 8 HOURS  11  . labetalol (NORMODYNE) 200 MG tablet Take 200 mg 2 (two) times daily by mouth.    . magnesium oxide (MAG-OX) 400 MG  tablet Take 400 mg daily by mouth. Take at noon    . montelukast (SINGULAIR) 10 MG tablet Take 10 mg at bedtime by mouth.    Earley Abide Compounding Base (MOUTHWASH-AF PO) Take 30 mLs 4 (four) times daily by mouth. musositis mouthwash-viscous lidocaine    . mycophenolate (MYFORTIC) 180 MG EC tablet Take 720 mg 2 (two) times daily by mouth.    Marland Kitchen omeprazole (PRILOSEC) 20 MG capsule Take 20 mg daily by mouth.    . predniSONE (DELTASONE) 5 MG tablet Take 5 mg daily with  breakfast by mouth.    . simvastatin (ZOCOR) 5 MG tablet Take 5 mg daily by mouth.    . sodium bicarbonate 650 MG tablet Take 1,300 mg 3 (three) times daily by mouth.    . sulfamethoxazole-trimethoprim (BACTRIM,SEPTRA) 400-80 MG tablet Take 1 tablet 3 (three) times a week by mouth. Monday, Wednesday, and Friday    . tacrolimus (PROGRAF) 1 MG capsule Take 4 mg 2 (two) times daily by mouth.    . Insulin Detemir (LEVEMIR FLEXTOUCH) 100 UNIT/ML Pen Levemir FlexTouch U-100 Insulin 100 unit/mL (3 mL) subcutaneous pen    . sodium polystyrene (KAYEXALATE) powder sodium polystyrene sulfonate oral powder     No current facility-administered medications on file prior to visit.    There are no Patient Instructions on file for this visit. No Follow-up on file.  Jahn Franchini A Kayelyn Lemon, PA-C

## 2017-06-20 ENCOUNTER — Encounter (INDEPENDENT_AMBULATORY_CARE_PROVIDER_SITE_OTHER): Payer: Self-pay

## 2017-06-21 ENCOUNTER — Other Ambulatory Visit (INDEPENDENT_AMBULATORY_CARE_PROVIDER_SITE_OTHER): Payer: Self-pay | Admitting: Vascular Surgery

## 2017-06-22 ENCOUNTER — Encounter
Admission: RE | Admit: 2017-06-22 | Discharge: 2017-06-22 | Disposition: A | Discharge status: Home / Self Care | Payer: Medicare Other | Source: Ambulatory Visit | Attending: Vascular Surgery | Admitting: Vascular Surgery

## 2017-06-22 ENCOUNTER — Other Ambulatory Visit: Payer: Self-pay

## 2017-06-22 DIAGNOSIS — Z01810 Encounter for preprocedural cardiovascular examination: Secondary | ICD-10-CM | POA: Insufficient documentation

## 2017-06-22 DIAGNOSIS — Z01812 Encounter for preprocedural laboratory examination: Secondary | ICD-10-CM | POA: Insufficient documentation

## 2017-06-22 DIAGNOSIS — R9431 Abnormal electrocardiogram [ECG] [EKG]: Secondary | ICD-10-CM | POA: Diagnosis not present

## 2017-06-22 HISTORY — DX: Hyperlipidemia, unspecified: E78.5

## 2017-06-22 LAB — CBC WITH DIFFERENTIAL/PLATELET
Basophils Absolute: 0 10*3/uL (ref 0–0.1)
Basophils Relative: 0 %
EOS ABS: 0.1 10*3/uL (ref 0–0.7)
Eosinophils Relative: 1 %
HCT: 34.8 % — ABNORMAL LOW (ref 35.0–47.0)
HEMOGLOBIN: 10.8 g/dL — AB (ref 12.0–16.0)
LYMPHS ABS: 0.3 10*3/uL — AB (ref 1.0–3.6)
Lymphocytes Relative: 4 %
MCH: 25.7 pg — AB (ref 26.0–34.0)
MCHC: 31 g/dL — ABNORMAL LOW (ref 32.0–36.0)
MCV: 82.9 fL (ref 80.0–100.0)
Monocytes Absolute: 0.8 10*3/uL (ref 0.2–0.9)
Monocytes Relative: 12 %
NEUTROS PCT: 83 %
Neutro Abs: 5.6 10*3/uL (ref 1.4–6.5)
Platelets: 260 10*3/uL (ref 150–440)
RBC: 4.2 MIL/uL (ref 3.80–5.20)
RDW: 14 % (ref 11.5–14.5)
WBC: 6.7 10*3/uL (ref 3.6–11.0)

## 2017-06-22 LAB — TYPE AND SCREEN
ABO/RH(D): O POS
ANTIBODY SCREEN: NEGATIVE

## 2017-06-22 LAB — BASIC METABOLIC PANEL
Anion gap: 9 (ref 5–15)
BUN: 24 mg/dL — ABNORMAL HIGH (ref 6–20)
CHLORIDE: 105 mmol/L (ref 101–111)
CO2: 22 mmol/L (ref 22–32)
CREATININE: 1.3 mg/dL — AB (ref 0.44–1.00)
Calcium: 9.1 mg/dL (ref 8.9–10.3)
GFR calc non Af Amer: 50 mL/min — ABNORMAL LOW (ref >60)
GFR, EST AFRICAN AMERICAN: 58 mL/min — AB (ref >60)
Glucose, Bld: 227 mg/dL — ABNORMAL HIGH (ref 65–99)
Potassium: 4.5 mmol/L (ref 3.5–5.1)
SODIUM: 136 mmol/L (ref 135–145)

## 2017-06-22 LAB — APTT: aPTT: 26 seconds (ref 24–36)

## 2017-06-22 LAB — PROTIME-INR
INR: 1.08
PROTHROMBIN TIME: 13.9 s (ref 11.4–15.2)

## 2017-06-22 NOTE — Patient Instructions (Addendum)
Your procedure is scheduled on: 06/29/17 Wed Report to Same Day Surgery 2nd floor medical mall [facility] Entrance-take elevator on left to 2nd floor.  Check in with surgery information desk.) To find out your arrival time please call [phone] between 1PM - 3PM on 06/28/17 Tues  Remember: Instructions that are not followed completely may result in serious medical risk, up to and including death, or upon the discretion of your surgeon and anesthesiologist your surgery may need to be rescheduled.    _x___ 1. Do not eat food after midnight the night before your procedure. You may drink clear liquids up to 2 hours before you are scheduled to arrive at the hospital for your procedure.  Do not drink clear liquids within 2 hours of your scheduled arrival to the hospital.  Clear liquids include  --Water or Apple juice without pulp  --Clear carbohydrate beverage such as ClearFast or Gatorade  --Black Coffee or Clear Tea (No milk, no creamers, do not add anything to                  the coffee or Tea Type 1 and type 2 diabetics should only drink water.  No gum chewing or hard candies.     __x__ 2. No Alcohol for 24 hours before or after surgery.   __x__3. No Smoking or e-cigarettes for 24 prior to surgery.  Do not use any chewable tobacco products for at least 6 hour prior to surgery   ____  4. Bring all medications with you on the day of surgery if instructed.    __x__ 5. Notify your doctor if there is any change in your medical condition     (cold, fever, infections).    x___6. On the morning of surgery brush your teeth with toothpaste and water.  You may rinse your mouth with mouth wash if you wish.  Do not swallow any toothpaste or mouthwash.   Do not wear jewelry, make-up, hairpins, clips or nail polish.  Do not wear lotions, powders, or perfumes. You may wear deodorant.  Do not shave 48 hours prior to surgery. Men may shave face and neck.  Do not bring valuables to the hospital.     [facility] is not responsible for any belongings or valuables.               Contacts, dentures or bridgework may not be worn into surgery.  Leave your suitcase in the car. After surgery it may be brought to your room.  For patients admitted to the hospital, discharge time is determined by your                       treatment team.  _  Patients discharged the day of surgery will not be allowed to drive home.  You will need someone to drive you home and stay with you the night of your procedure.    Please read over the following fact sheets that you were given:   [facility] Preparing for Surgery and or MRSA Information   _x___ Take anti-hypertensive listed below, cardiac, seizure, asthma,     anti-reflux and psychiatric medicines. These include:  1. amLODipine (NORVASC) 5 MG tablet  2.buPROPion (WELLBUTRIN XL) 150 MG 24 hr tablet  3.citalopram (CELEXA) 40 MG tablet  4.cloNIDine (CATAPRES) 0.1 MG tablet  5.labetalol (NORMODYNE) 100 MG tablet  6.omeprazole (PRILOSEC) 20 MG capsule  7 predniSONE (DELTASONE) 5 MG tablet  8 mycophenolate (MYFORTIC) 180 MG  EC tablet  9 tacrolimus (PROGRAF) 1 MG capsule  ____Fleets enema or Magnesium Citrate as directed.   _x___ Use Dial Soap as directed on instruction sheet   ____ Use inhalers on the day of surgery and bring to hospital day of surgery  ____ Stop Metformin and Janumet 2 days prior to surgery.    _x___ Take 1/2 of usual insulin dose the night before surgery and none on the morning     surgery.   _x___ Follow recommendations from Cardiologist, Pulmonologist or PCP regarding          stopping Aspirin, Coumadin, Plavix ,Eliquis, Effient, or Pradaxa, and Pletal.  X____Stop Anti-inflammatories such as Advil, Aleve, Ibuprofen, Motrin, Naproxen, Naprosyn, Goodies powders or aspirin products. OK to take Tylenol and                          Celebrex.   _x___ Stop supplements until after surgery.  But may continue Vitamin D, Vitamin B,        and multivitamin.   ____ Bring C-Pap to the hospital.

## 2017-06-23 NOTE — Pre-Procedure Instructions (Signed)
EKG NOTED. 

## 2017-06-28 MED ORDER — CEFAZOLIN SODIUM-DEXTROSE 2-4 GM/100ML-% IV SOLN
2.0000 g | INTRAVENOUS | Status: AC
  Administered 2017-06-29: 2 g via INTRAVENOUS

## 2017-06-29 ENCOUNTER — Other Ambulatory Visit: Payer: Self-pay

## 2017-06-29 ENCOUNTER — Ambulatory Visit: Payer: Medicare Other | Admitting: Anesthesiology

## 2017-06-29 ENCOUNTER — Ambulatory Visit
Admission: RE | Admit: 2017-06-29 | Discharge: 2017-06-29 | Disposition: A | Discharge status: Home / Self Care | Payer: Medicare Other | Source: Ambulatory Visit | Attending: Vascular Surgery | Admitting: Vascular Surgery

## 2017-06-29 ENCOUNTER — Encounter: Payer: Self-pay | Admitting: *Deleted

## 2017-06-29 ENCOUNTER — Encounter
Admission: RE | Disposition: A | Discharge status: Home / Self Care | Payer: Self-pay | Source: Ambulatory Visit | Attending: Vascular Surgery

## 2017-06-29 DIAGNOSIS — Z91048 Other nonmedicinal substance allergy status: Secondary | ICD-10-CM | POA: Insufficient documentation

## 2017-06-29 DIAGNOSIS — Z91018 Allergy to other foods: Secondary | ICD-10-CM | POA: Insufficient documentation

## 2017-06-29 DIAGNOSIS — Z955 Presence of coronary angioplasty implant and graft: Secondary | ICD-10-CM | POA: Insufficient documentation

## 2017-06-29 DIAGNOSIS — Z9049 Acquired absence of other specified parts of digestive tract: Secondary | ICD-10-CM | POA: Diagnosis not present

## 2017-06-29 DIAGNOSIS — Z794 Long term (current) use of insulin: Secondary | ICD-10-CM | POA: Insufficient documentation

## 2017-06-29 DIAGNOSIS — E1122 Type 2 diabetes mellitus with diabetic chronic kidney disease: Secondary | ICD-10-CM | POA: Insufficient documentation

## 2017-06-29 DIAGNOSIS — Z940 Kidney transplant status: Secondary | ICD-10-CM | POA: Diagnosis not present

## 2017-06-29 DIAGNOSIS — Z881 Allergy status to other antibiotic agents status: Secondary | ICD-10-CM | POA: Diagnosis not present

## 2017-06-29 DIAGNOSIS — I120 Hypertensive chronic kidney disease with stage 5 chronic kidney disease or end stage renal disease: Secondary | ICD-10-CM | POA: Diagnosis not present

## 2017-06-29 DIAGNOSIS — Z841 Family history of disorders of kidney and ureter: Secondary | ICD-10-CM | POA: Insufficient documentation

## 2017-06-29 DIAGNOSIS — Z79899 Other long term (current) drug therapy: Secondary | ICD-10-CM | POA: Insufficient documentation

## 2017-06-29 DIAGNOSIS — Z91013 Allergy to seafood: Secondary | ICD-10-CM | POA: Insufficient documentation

## 2017-06-29 DIAGNOSIS — N186 End stage renal disease: Secondary | ICD-10-CM

## 2017-06-29 DIAGNOSIS — Z9889 Other specified postprocedural states: Secondary | ICD-10-CM | POA: Diagnosis not present

## 2017-06-29 DIAGNOSIS — Z7982 Long term (current) use of aspirin: Secondary | ICD-10-CM | POA: Insufficient documentation

## 2017-06-29 DIAGNOSIS — Z885 Allergy status to narcotic agent status: Secondary | ICD-10-CM | POA: Insufficient documentation

## 2017-06-29 DIAGNOSIS — Z992 Dependence on renal dialysis: Secondary | ICD-10-CM | POA: Insufficient documentation

## 2017-06-29 DIAGNOSIS — K219 Gastro-esophageal reflux disease without esophagitis: Secondary | ICD-10-CM | POA: Insufficient documentation

## 2017-06-29 DIAGNOSIS — Z8249 Family history of ischemic heart disease and other diseases of the circulatory system: Secondary | ICD-10-CM | POA: Insufficient documentation

## 2017-06-29 DIAGNOSIS — Z9851 Tubal ligation status: Secondary | ICD-10-CM | POA: Insufficient documentation

## 2017-06-29 DIAGNOSIS — I2510 Atherosclerotic heart disease of native coronary artery without angina pectoris: Secondary | ICD-10-CM | POA: Insufficient documentation

## 2017-06-29 DIAGNOSIS — E079 Disorder of thyroid, unspecified: Secondary | ICD-10-CM | POA: Insufficient documentation

## 2017-06-29 DIAGNOSIS — Z833 Family history of diabetes mellitus: Secondary | ICD-10-CM | POA: Insufficient documentation

## 2017-06-29 DIAGNOSIS — N179 Acute kidney failure, unspecified: Secondary | ICD-10-CM | POA: Diagnosis not present

## 2017-06-29 DIAGNOSIS — Z888 Allergy status to other drugs, medicaments and biological substances status: Secondary | ICD-10-CM | POA: Diagnosis not present

## 2017-06-29 DIAGNOSIS — G458 Other transient cerebral ischemic attacks and related syndromes: Secondary | ICD-10-CM | POA: Diagnosis not present

## 2017-06-29 DIAGNOSIS — E785 Hyperlipidemia, unspecified: Secondary | ICD-10-CM | POA: Insufficient documentation

## 2017-06-29 HISTORY — PX: LIGATION OF ARTERIOVENOUS  FISTULA: SHX5948

## 2017-06-29 LAB — POCT PREGNANCY, URINE: PREG TEST UR: NEGATIVE

## 2017-06-29 LAB — GLUCOSE, CAPILLARY
GLUCOSE-CAPILLARY: 126 mg/dL — AB (ref 65–99)
Glucose-Capillary: 166 mg/dL — ABNORMAL HIGH (ref 65–99)

## 2017-06-29 SURGERY — LIGATION OF ARTERIOVENOUS  FISTULA
Anesthesia: General | Site: Arm Upper | Laterality: Left | Wound class: Clean

## 2017-06-29 MED ORDER — SODIUM CHLORIDE 0.9 % IV SOLN
INTRAVENOUS | Status: DC | PRN
  Administered 2017-06-29: 12:00:00 1 mL via INTRAMUSCULAR

## 2017-06-29 MED ORDER — FENTANYL CITRATE (PF) 100 MCG/2ML IJ SOLN
INTRAMUSCULAR | Status: AC
  Filled 2017-06-29: qty 2

## 2017-06-29 MED ORDER — SODIUM CHLORIDE 0.9 % IV SOLN
INTRAVENOUS | Status: DC
  Administered 2017-06-29 (×2): via INTRAVENOUS

## 2017-06-29 MED ORDER — TRAMADOL HCL 50 MG PO TABS: 50.0000 mg | ORAL_TABLET | Freq: Four times a day (QID) | ORAL | 0 refills | Status: DC | PRN

## 2017-06-29 MED ORDER — FENTANYL CITRATE (PF) 100 MCG/2ML IJ SOLN
INTRAMUSCULAR | Status: DC | PRN
  Administered 2017-06-29: 25 ug via INTRAVENOUS
  Administered 2017-06-29: 50 ug via INTRAVENOUS
  Administered 2017-06-29: 25 ug via INTRAVENOUS

## 2017-06-29 MED ORDER — TRAMADOL HCL 50 MG PO TABS
ORAL_TABLET | ORAL | Status: AC
  Filled 2017-06-29: qty 1

## 2017-06-29 MED ORDER — MIDAZOLAM HCL 2 MG/2ML IJ SOLN
INTRAMUSCULAR | Status: AC
  Filled 2017-06-29: qty 2

## 2017-06-29 MED ORDER — BUPIVACAINE-EPINEPHRINE (PF) 0.5% -1:200000 IJ SOLN
INTRAMUSCULAR | Status: DC | PRN
  Administered 2017-06-29: 4 mL via PERINEURAL

## 2017-06-29 MED ORDER — SCOPOLAMINE 1 MG/3DAYS TD PT72
MEDICATED_PATCH | TRANSDERMAL | Status: AC
  Administered 2017-06-29: 1.5 mg
  Filled 2017-06-29: qty 1

## 2017-06-29 MED ORDER — BUPIVACAINE-EPINEPHRINE (PF) 0.5% -1:200000 IJ SOLN
INTRAMUSCULAR | Status: AC
  Filled 2017-06-29: qty 30

## 2017-06-29 MED ORDER — TRAMADOL HCL 50 MG PO TABS
50.0000 mg | ORAL_TABLET | Freq: Four times a day (QID) | ORAL | Status: DC
  Administered 2017-06-29: 50 mg via ORAL

## 2017-06-29 MED ORDER — PROPOFOL 10 MG/ML IV BOLUS
INTRAVENOUS | Status: DC | PRN
  Administered 2017-06-29: 160 mg via INTRAVENOUS
  Administered 2017-06-29: 40 mg via INTRAVENOUS

## 2017-06-29 MED ORDER — ONDANSETRON HCL 4 MG/2ML IJ SOLN
INTRAMUSCULAR | Status: DC | PRN
  Administered 2017-06-29: 4 mg via INTRAVENOUS

## 2017-06-29 MED ORDER — MIDAZOLAM HCL 2 MG/2ML IJ SOLN
INTRAMUSCULAR | Status: DC | PRN
  Administered 2017-06-29: 2 mg via INTRAVENOUS

## 2017-06-29 MED ORDER — HEPARIN SODIUM (PORCINE) 5000 UNIT/ML IJ SOLN
INTRAMUSCULAR | Status: AC
  Filled 2017-06-29: qty 1

## 2017-06-29 SURGICAL SUPPLY — 47 items
ADH SKN CLS APL DERMABOND .7 (GAUZE/BANDAGES/DRESSINGS) ×1
BAG DECANTER FOR FLEXI CONT (MISCELLANEOUS) ×3 IMPLANT
BLADE SURG SZ11 CARB STEEL (BLADE) ×3 IMPLANT
BOOT SUTURE AID YELLOW STND (SUTURE) ×3 IMPLANT
CANISTER SUCT 1200ML W/VALVE (MISCELLANEOUS) ×3 IMPLANT
CHLORAPREP W/TINT 26ML (MISCELLANEOUS) ×3 IMPLANT
CLIP SPRNG 6MM S-JAW DBL (CLIP) ×3
DERMABOND ADVANCED (GAUZE/BANDAGES/DRESSINGS) ×2
DERMABOND ADVANCED .7 DNX12 (GAUZE/BANDAGES/DRESSINGS) ×1 IMPLANT
ELECT CAUTERY BLADE 6.4 (BLADE) ×3 IMPLANT
ELECT REM PT RETURN 9FT ADLT (ELECTROSURGICAL) ×3
ELECTRODE REM PT RTRN 9FT ADLT (ELECTROSURGICAL) ×1 IMPLANT
GLOVE BIO SURGEON STRL SZ7 (GLOVE) ×6 IMPLANT
GLOVE INDICATOR 7.5 STRL GRN (GLOVE) ×3 IMPLANT
GOWN STRL REUS W/ TWL LRG LVL3 (GOWN DISPOSABLE) ×1 IMPLANT
GOWN STRL REUS W/ TWL XL LVL3 (GOWN DISPOSABLE) ×2 IMPLANT
GOWN STRL REUS W/TWL LRG LVL3 (GOWN DISPOSABLE) ×3
GOWN STRL REUS W/TWL XL LVL3 (GOWN DISPOSABLE) ×6
HEMOSTAT SURGICEL 2X3 (HEMOSTASIS) ×3 IMPLANT
IV NS 500ML (IV SOLUTION) ×3
IV NS 500ML BAXH (IV SOLUTION) ×1 IMPLANT
LABEL OR SOLS (LABEL) ×3 IMPLANT
LOOP RED MAXI  1X406MM (MISCELLANEOUS) ×2
LOOP VESSEL MAXI 1X406 RED (MISCELLANEOUS) ×1 IMPLANT
LOOP VESSEL MINI 0.8X406 BLUE (MISCELLANEOUS) ×1 IMPLANT
LOOPS BLUE MINI 0.8X406MM (MISCELLANEOUS) ×2
NEEDLE FILTER BLUNT 18X 1/2SAF (NEEDLE) ×2
NEEDLE FILTER BLUNT 18X1 1/2 (NEEDLE) ×1 IMPLANT
NS IRRIG 500ML POUR BTL (IV SOLUTION) ×3 IMPLANT
PACK EXTREMITY ARMC (MISCELLANEOUS) ×3 IMPLANT
PAD PREP 24X41 OB/GYN DISP (PERSONAL CARE ITEMS) ×3 IMPLANT
SOLUTION CELL SAVER (CLIP) ×1 IMPLANT
STOCKINETTE STRL 4IN 9604848 (GAUZE/BANDAGES/DRESSINGS) ×3 IMPLANT
STRAP SAFETY 5IN WIDE (MISCELLANEOUS) ×3 IMPLANT
SUT MNCRL AB 4-0 PS2 18 (SUTURE) ×3 IMPLANT
SUT PROLENE 6 0 BV (SUTURE) ×3 IMPLANT
SUT SILK 2 0 (SUTURE) ×3
SUT SILK 2 0 SH (SUTURE) ×3 IMPLANT
SUT SILK 2-0 18XBRD TIE 12 (SUTURE) ×1 IMPLANT
SUT SILK 3 0 (SUTURE) ×3
SUT SILK 3-0 18XBRD TIE 12 (SUTURE) ×1 IMPLANT
SUT SILK 4 0 (SUTURE) ×3
SUT SILK 4-0 18XBRD TIE 12 (SUTURE) ×1 IMPLANT
SUT VIC AB 3-0 SH 27 (SUTURE) ×3
SUT VIC AB 3-0 SH 27X BRD (SUTURE) ×1 IMPLANT
SYR 20CC LL (SYRINGE) ×3 IMPLANT
SYR 3ML LL SCALE MARK (SYRINGE) ×3 IMPLANT

## 2017-06-29 NOTE — Anesthesia Post-op Follow-up Note (Signed)
Anesthesia QCDR form completed.        

## 2017-06-29 NOTE — H&P (Signed)
[facility] History & Physical Update  The patient was interviewed and re-examined.  The patient's previous History and Physical has been reviewed and is unchanged.  There is no change in the plan of care. We plan to proceed with the scheduled procedure.  Leotis Pain, MD  06/29/2017, 9:55 AM

## 2017-06-29 NOTE — Op Note (Signed)
[facility]   OPERATIVE NOTE  DATE: 06/29/2017  PRE-OPERATIVE DIAGNOSIS: ESRD, but s/p renal transplant, steal syndrome left arm with patent left radiocephalic AVF  POST-OPERATIVE DIAGNOSIS: same as above  PROCEDURE: 1.   Ligation of left radiocephalic AVF  SURGEON: Leotis Pain, MD  ASSISTANT(S): None  ANESTHESIA: general  ESTIMATED BLOOD LOSS: 3 cc  FINDING(S): 1.  none  SPECIMEN(S):  None  INDICATIONS:   Patient is a 42 y.o.female who presents with renal failure but is now status post renal transplant and no longer using her fistula.  She has steal syndrome with pain in her left hand and a large patent left radiocephalic AV fistula.  As she is no longer using this fistula and has steal, ligation is indicated. Risks and benefits were discussed and the patient was agreeable to proceed.  DESCRIPTION: After obtaining full informed written consent, the patient was brought back to the operating room and placed supine upon the operating table.  The patient received IV antibiotics prior to induction.  After obtaining adequate anesthesia, the patient was prepped and draped in the standard fashion. I created a small transverse incision in the distal forearm overlying the AVF near the anastomosis.  The fistula was dissected out.  In that area, a total of three 2-0 silk ties were used to ligate the AVF.  There was then loss of the thrill beyond the area of ligation consistent with occlusion of the AV fistula.  The wound was then irrigated and closed with a 3-0 Vicryl and a 4-0 Monocryl. Dermabond was placed as a dressing. The patient was taken to the recovery room in stable condition having tolerated the procedure well.  COMPLICATIONS: None  CONDITION: Stable   Leotis Pain 06/29/2017 12:39 PM  This note was created with Dragon Medical transcription system. Any errors in dictation are purely unintentional.

## 2017-06-29 NOTE — Anesthesia Preprocedure Evaluation (Signed)
Anesthesia Evaluation  Patient identified by MRN, date of birth, ID band Patient awake    Reviewed: Allergy & Precautions, H&P , NPO status , Patient's Chart, lab work & pertinent test results, reviewed documented beta blocker date and time   History of Anesthesia Complications (+) PONV and history of anesthetic complications  Airway Mallampati: II  TM Distance: >3 FB Neck ROM: full    Dental  (+) Teeth Intact   Pulmonary neg pulmonary ROS, neg shortness of breath, asthma , pneumonia, resolved,    Pulmonary exam normal        Cardiovascular Exercise Tolerance: Good hypertension, On Medications + CAD  negative cardio ROS Normal cardiovascular exam Rate:Normal     Neuro/Psych PSYCHIATRIC DISORDERS Anxiety Depression  Neuromuscular disease negative neurological ROS  negative psych ROS   GI/Hepatic negative GI ROS, Neg liver ROS, GERD  Medicated,  Endo/Other  negative endocrine ROSdiabetesHyperthyroidism Morbid obesity  Renal/GU Renal diseasenegative Renal ROS  negative genitourinary   Musculoskeletal   Abdominal   Peds  Hematology negative hematology ROS (+) anemia ,   Anesthesia Other Findings   Reproductive/Obstetrics negative OB ROS                             Anesthesia Physical Anesthesia Plan  ASA: III  Anesthesia Plan: General LMA   Post-op Pain Management:    Induction:   PONV Risk Score and Plan:   Airway Management Planned:   Additional Equipment:   Intra-op Plan:   Post-operative Plan:   Informed Consent: I have reviewed the patients History and Physical, chart, labs and discussed the procedure including the risks, benefits and alternatives for the proposed anesthesia with the patient or authorized representative who has indicated his/her understanding and acceptance.     Plan Discussed with: CRNA  Anesthesia Plan Comments:         Anesthesia Quick  Evaluation

## 2017-06-29 NOTE — Discharge Instructions (Signed)

## 2017-06-29 NOTE — Transfer of Care (Signed)
Immediate Anesthesia Transfer of Care Note  Patient: Linda Ellison  Procedure(s) Performed: LIGATION OF ARTERIOVENOUS  FISTULA ( RADIOCEPHALIC ) (Left Arm Upper)  Patient Location: PACU  Anesthesia Type:General  Level of Consciousness: awake and oriented  Airway & Oxygen Therapy: Patient Spontanous Breathing  Post-op Assessment: Report given to RN  Post vital signs: Reviewed and stable  Last Vitals:  Vitals Value Taken Time  BP 157/105 06/29/2017 12:25 PM  Temp 37 C 06/29/2017 12:25 PM  Pulse    Resp 18 06/29/2017 12:25 PM  SpO2    Vitals shown include unvalidated device data.  Last Pain:  Vitals:   06/29/17 0954  TempSrc: Oral         Complications: No apparent anesthesia complications

## 2017-06-29 NOTE — Anesthesia Procedure Notes (Signed)
Procedure Name: LMA Insertion Date/Time: 06/29/2017 11:45 AM Performed by: Lesle Reek, CRNA Pre-anesthesia Checklist: Patient identified, Suction available, Emergency Drugs available, Patient being monitored and Timeout performed Patient Re-evaluated:Patient Re-evaluated prior to induction Oxygen Delivery Method: Circle system utilized Preoxygenation: Pre-oxygenation with 100% oxygen Induction Type: Combination inhalational/ intravenous induction LMA: LMA inserted LMA Size: 4.0 Number of attempts: 1 Placement Confirmation: positive ETCO2,  CO2 detector and breath sounds checked- equal and bilateral

## 2017-06-30 NOTE — Anesthesia Postprocedure Evaluation (Signed)
Anesthesia Post Note  Patient: Linda Ellison  Procedure(s) Performed: LIGATION OF ARTERIOVENOUS  FISTULA ( RADIOCEPHALIC ) (Left Arm Upper)  Patient location during evaluation: PACU Anesthesia Type: General Level of consciousness: awake and alert Pain management: pain level controlled Vital Signs Assessment: post-procedure vital signs reviewed and stable Respiratory status: spontaneous breathing, nonlabored ventilation, respiratory function stable and patient connected to nasal cannula oxygen Cardiovascular status: blood pressure returned to baseline and stable Postop Assessment: no apparent nausea or vomiting Anesthetic complications: no     Last Vitals:  Vitals:   06/29/17 1318 06/29/17 1329  BP:  (!) 159/92  Pulse:    Resp: 16   Temp:    SpO2:      Last Pain:  Vitals:   06/30/17 0821  TempSrc:   PainSc: [place]

## 2017-07-29 ENCOUNTER — Ambulatory Visit (INDEPENDENT_AMBULATORY_CARE_PROVIDER_SITE_OTHER): Payer: Medicare Other | Admitting: Vascular Surgery

## 2017-09-05 DIAGNOSIS — I150 Renovascular hypertension: Secondary | ICD-10-CM | POA: Insufficient documentation

## 2017-10-29 IMAGING — CR DG HAND COMPLETE 3+V*L*
3 series · 3 of 3 positions shown · non-contrast
Comparison: None.

CLINICAL DATA: Status post fall at home onto left arm, with diffuse
left wrist pain. Initial encounter.

EXAM:
LEFT HAND - COMPLETE 3+ VIEW

[x hand lat left]
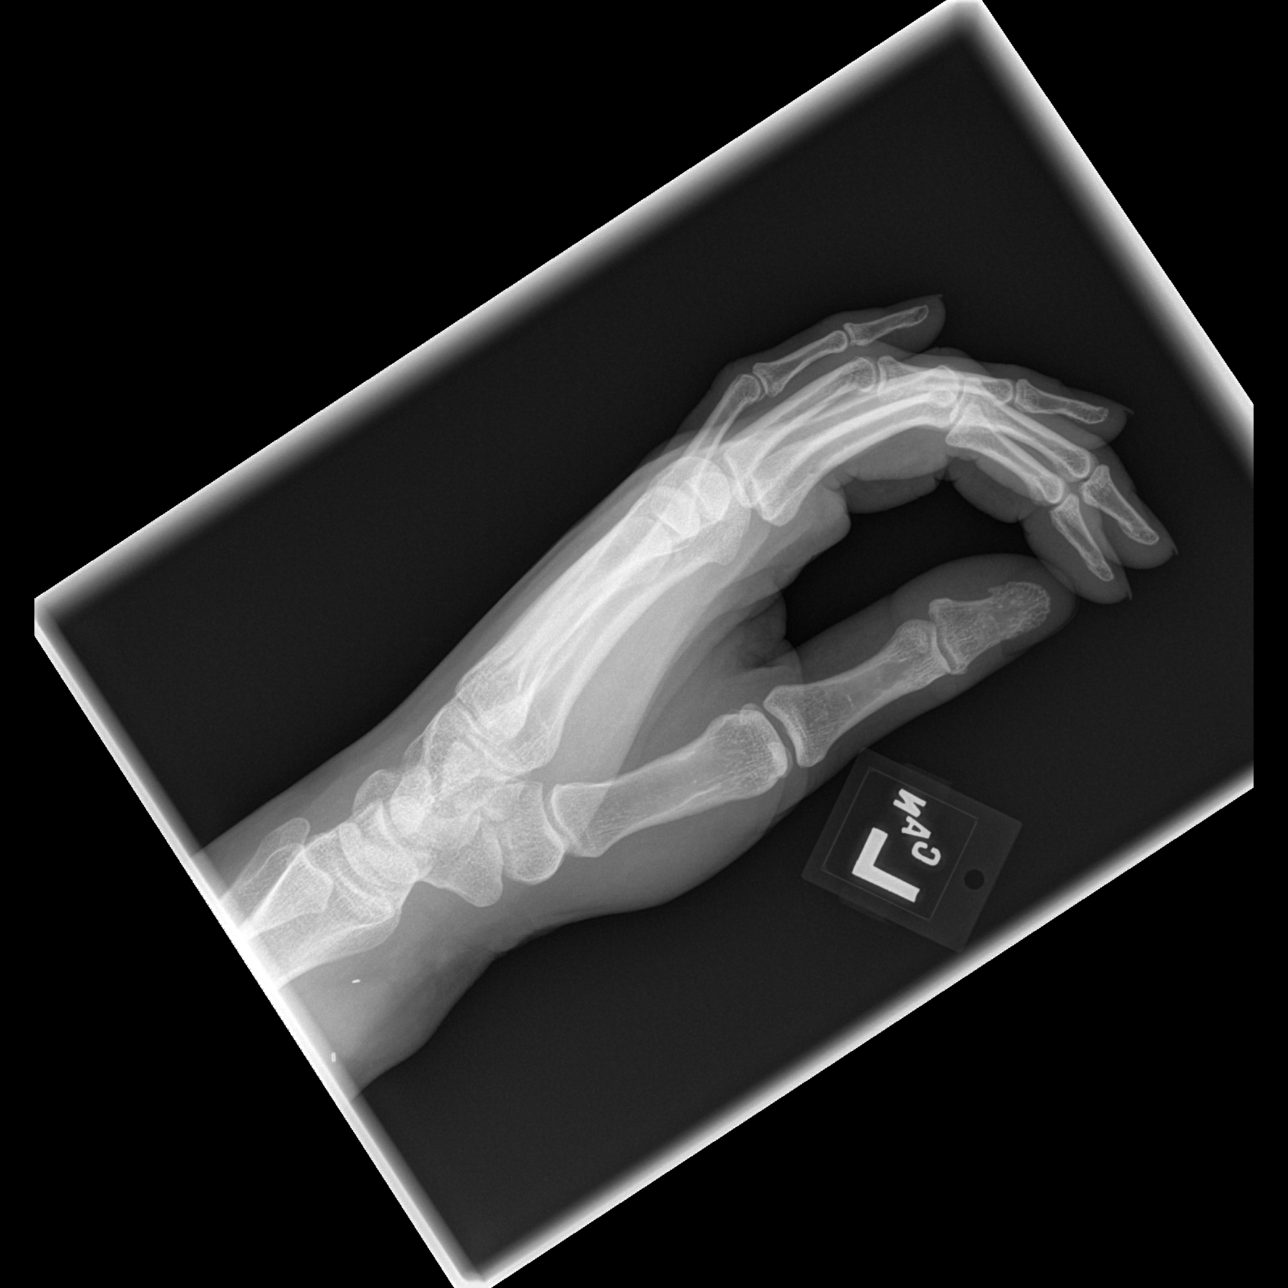

[x hand pa left]
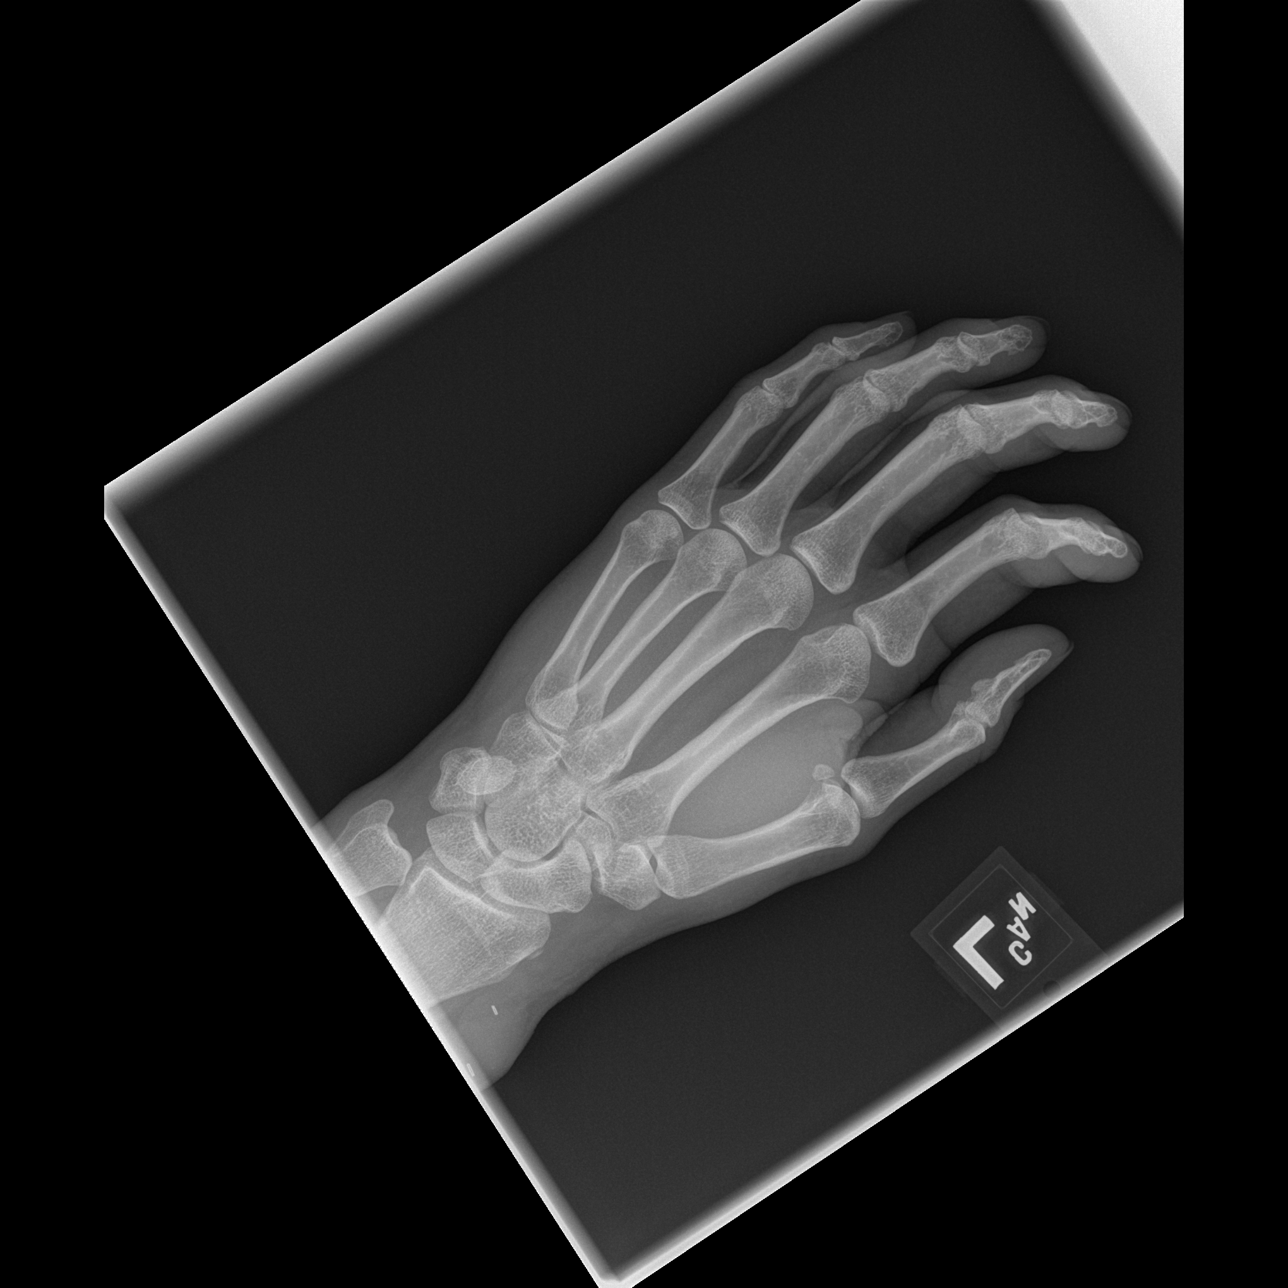

[x hand oblique left]
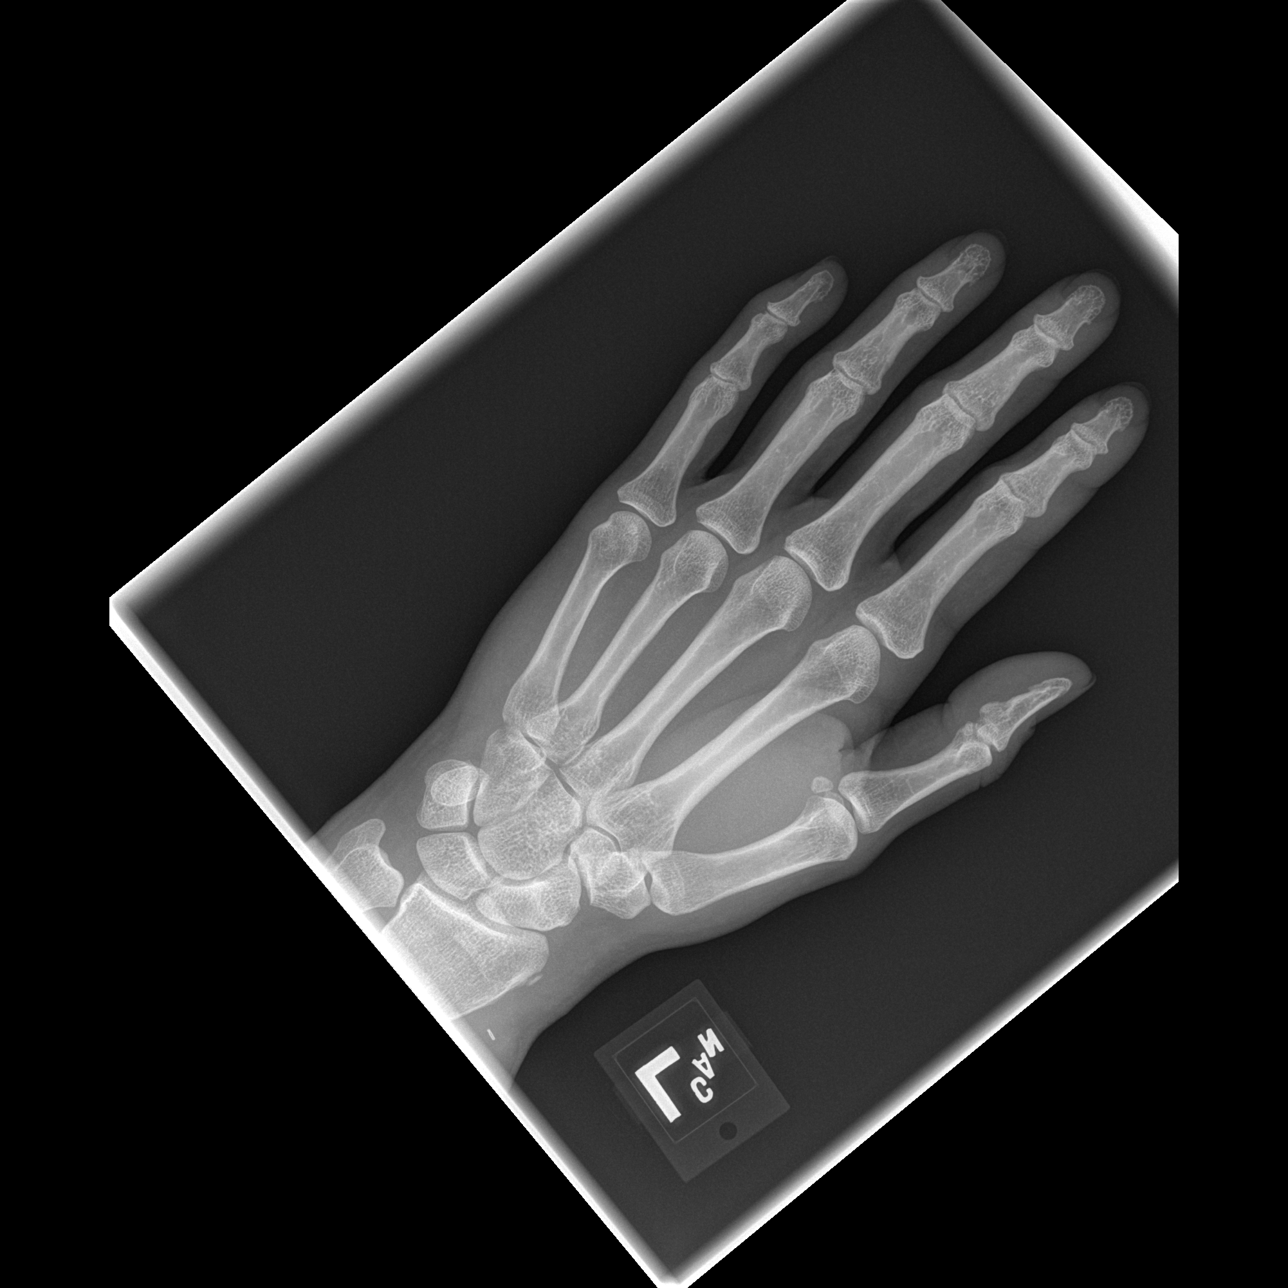

[3 of 3 positions shown; findings below may reference images not displayed]

FINDINGS: A tiny osseous fragment at the radial aspect of the distal radial
metaphysis may reflect a tiny avulsion injury, or remote injury.

There is no additional evidence of fracture. The joint spaces are
preserved. The carpal rows are intact, and demonstrate normal
alignment. Mild postoperative change is noted at the wrist.
IMPRESSION: Tiny osseous fragment at the radial aspect of the distal radial
metaphysis may reflect a tiny avulsion injury, or remote injury.

## 2017-11-20 ENCOUNTER — Emergency Department (HOSPITAL_COMMUNITY)
Admission: EM | Admit: 2017-11-20 | Discharge: 2017-11-21 | Disposition: A | Discharge status: Other Acute Inpatient Hospital | Payer: Medicare Other | Attending: Emergency Medicine | Admitting: Emergency Medicine

## 2017-11-20 ENCOUNTER — Other Ambulatory Visit: Payer: Self-pay

## 2017-11-20 ENCOUNTER — Encounter (HOSPITAL_COMMUNITY): Payer: Self-pay | Admitting: *Deleted

## 2017-11-20 DIAGNOSIS — Z79899 Other long term (current) drug therapy: Secondary | ICD-10-CM | POA: Insufficient documentation

## 2017-11-20 DIAGNOSIS — R45851 Suicidal ideations: Secondary | ICD-10-CM | POA: Insufficient documentation

## 2017-11-20 DIAGNOSIS — E039 Hypothyroidism, unspecified: Secondary | ICD-10-CM | POA: Insufficient documentation

## 2017-11-20 DIAGNOSIS — I2510 Atherosclerotic heart disease of native coronary artery without angina pectoris: Secondary | ICD-10-CM | POA: Insufficient documentation

## 2017-11-20 DIAGNOSIS — Z008 Encounter for other general examination: Secondary | ICD-10-CM | POA: Insufficient documentation

## 2017-11-20 DIAGNOSIS — N186 End stage renal disease: Secondary | ICD-10-CM | POA: Insufficient documentation

## 2017-11-20 DIAGNOSIS — Z7982 Long term (current) use of aspirin: Secondary | ICD-10-CM | POA: Insufficient documentation

## 2017-11-20 DIAGNOSIS — I120 Hypertensive chronic kidney disease with stage 5 chronic kidney disease or end stage renal disease: Secondary | ICD-10-CM | POA: Insufficient documentation

## 2017-11-20 DIAGNOSIS — F332 Major depressive disorder, recurrent severe without psychotic features: Secondary | ICD-10-CM

## 2017-11-20 DIAGNOSIS — E1122 Type 2 diabetes mellitus with diabetic chronic kidney disease: Secondary | ICD-10-CM | POA: Insufficient documentation

## 2017-11-20 DIAGNOSIS — J45909 Unspecified asthma, uncomplicated: Secondary | ICD-10-CM | POA: Insufficient documentation

## 2017-11-20 DIAGNOSIS — Z794 Long term (current) use of insulin: Secondary | ICD-10-CM | POA: Insufficient documentation

## 2017-11-20 DIAGNOSIS — F333 Major depressive disorder, recurrent, severe with psychotic symptoms: Secondary | ICD-10-CM | POA: Diagnosis present

## 2017-11-20 LAB — CBC
HEMATOCRIT: 38.2 % (ref 36.0–46.0)
Hemoglobin: 12 g/dL (ref 12.0–15.0)
MCH: 26.4 pg (ref 26.0–34.0)
MCHC: 31.4 g/dL (ref 30.0–36.0)
MCV: 84.1 fL (ref 78.0–100.0)
Platelets: 335 10*3/uL (ref 150–400)
RBC: 4.54 MIL/uL (ref 3.87–5.11)
RDW: 13.5 % (ref 11.5–15.5)
WBC: 8.4 10*3/uL (ref 4.0–10.5)

## 2017-11-20 LAB — COMPREHENSIVE METABOLIC PANEL
ALT: 20 U/L (ref 0–44)
ANION GAP: 10 (ref 5–15)
AST: 18 U/L (ref 15–41)
Albumin: 3.7 g/dL (ref 3.5–5.0)
Alkaline Phosphatase: 93 U/L (ref 38–126)
BILIRUBIN TOTAL: 0.5 mg/dL (ref 0.3–1.2)
BUN: 18 mg/dL (ref 6–20)
CO2: 22 mmol/L (ref 22–32)
Calcium: 9.2 mg/dL (ref 8.9–10.3)
Chloride: 107 mmol/L (ref 98–111)
Creatinine, Ser: 1.19 mg/dL — ABNORMAL HIGH (ref 0.44–1.00)
GFR calc Af Amer: >60 mL/min (ref >60)
GFR calc non Af Amer: 56 mL/min — ABNORMAL LOW (ref >60)
Glucose, Bld: 126 mg/dL — ABNORMAL HIGH (ref 70–99)
POTASSIUM: 4.5 mmol/L (ref 3.5–5.1)
Sodium: 139 mmol/L (ref 135–145)
Total Protein: 7.3 g/dL (ref 6.5–8.1)

## 2017-11-20 LAB — ETHANOL: Alcohol, Ethyl (B): <10 mg/dL (ref <10)

## 2017-11-20 LAB — SALICYLATE LEVEL: Salicylate Lvl: <7 mg/dL (ref 2.8–30.0)

## 2017-11-20 LAB — RAPID URINE DRUG SCREEN, HOSP PERFORMED
AMPHETAMINES: NOT DETECTED
BARBITURATES: NOT DETECTED
BENZODIAZEPINES: NOT DETECTED
COCAINE: NOT DETECTED
Tetrahydrocannabinol: NOT DETECTED

## 2017-11-20 LAB — CBG MONITORING, ED: GLUCOSE-CAPILLARY: 220 mg/dL — AB (ref 70–99)

## 2017-11-20 LAB — ACETAMINOPHEN LEVEL

## 2017-11-20 LAB — I-STAT BETA HCG BLOOD, ED (MC, WL, AP ONLY)

## 2017-11-20 MED ORDER — GABAPENTIN 300 MG PO CAPS
300.0000 mg | ORAL_CAPSULE | Freq: Every day | ORAL | Status: DC
  Administered 2017-11-20: 300 mg via ORAL
  Filled 2017-11-20: qty 1

## 2017-11-20 MED ORDER — MYCOPHENOLATE SODIUM 180 MG PO TBEC
720.0000 mg | DELAYED_RELEASE_TABLET | Freq: Two times a day (BID) | ORAL | Status: DC
  Administered 2017-11-20 – 2017-11-21 (×2): 720 mg via ORAL
  Filled 2017-11-20 (×2): qty 4

## 2017-11-20 MED ORDER — TRAMADOL HCL 50 MG PO TABS: 50.0000 mg | ORAL_TABLET | Freq: Four times a day (QID) | ORAL | Status: DC | PRN

## 2017-11-20 MED ORDER — BUPROPION HCL ER (XL) 150 MG PO TB24
300.0000 mg | ORAL_TABLET | Freq: Every day | ORAL | Status: DC
  Administered 2017-11-21: 300 mg via ORAL
  Filled 2017-11-20: qty 2

## 2017-11-20 MED ORDER — SULFAMETHOXAZOLE-TRIMETHOPRIM 400-80 MG PO TABS
1.0000 | ORAL_TABLET | ORAL | Status: DC
  Administered 2017-11-21: 1 via ORAL
  Filled 2017-11-20: qty 1

## 2017-11-20 MED ORDER — ONDANSETRON HCL 4 MG PO TABS: 4.0000 mg | ORAL_TABLET | Freq: Three times a day (TID) | ORAL | Status: DC | PRN

## 2017-11-20 MED ORDER — ASPIRIN EC 81 MG PO TBEC
81.0000 mg | DELAYED_RELEASE_TABLET | Freq: Every day | ORAL | Status: DC
  Administered 2017-11-21: 81 mg via ORAL
  Filled 2017-11-20: qty 1

## 2017-11-20 MED ORDER — SIMVASTATIN 10 MG PO TABS
10.0000 mg | ORAL_TABLET | Freq: Every day | ORAL | Status: DC
  Administered 2017-11-20: 10 mg via ORAL
  Filled 2017-11-20: qty 1

## 2017-11-20 MED ORDER — FUROSEMIDE 20 MG PO TABS
20.0000 mg | ORAL_TABLET | Freq: Every day | ORAL | Status: DC
  Administered 2017-11-21: 20 mg via ORAL
  Filled 2017-11-20: qty 1

## 2017-11-20 MED ORDER — ALUM & MAG HYDROXIDE-SIMETH 200-200-20 MG/5ML PO SUSP: 30.0000 mL | Freq: Four times a day (QID) | ORAL | Status: DC | PRN

## 2017-11-20 MED ORDER — INSULIN ASPART 100 UNIT/ML ~~LOC~~ SOLN
10.0000 [IU] | Freq: Every day | SUBCUTANEOUS | Status: DC
  Administered 2017-11-21: 10 [IU] via SUBCUTANEOUS
  Filled 2017-11-20: qty 1

## 2017-11-20 MED ORDER — INSULIN ASPART 100 UNIT/ML FLEXPEN: 10.0000 [IU] | PEN_INJECTOR | Freq: Three times a day (TID) | SUBCUTANEOUS | Status: DC

## 2017-11-20 MED ORDER — MONTELUKAST SODIUM 10 MG PO TABS
10.0000 mg | ORAL_TABLET | Freq: Every day | ORAL | Status: DC
  Administered 2017-11-20: 10 mg via ORAL
  Filled 2017-11-20: qty 1

## 2017-11-20 MED ORDER — CLONIDINE HCL 0.1 MG PO TABS
0.3000 mg | ORAL_TABLET | Freq: Three times a day (TID) | ORAL | Status: DC
Start: 2017-11-20 — End: 2017-11-21
  Administered 2017-11-20 – 2017-11-21 (×2): 0.3 mg via ORAL
  Filled 2017-11-20 (×2): qty 3

## 2017-11-20 MED ORDER — CITALOPRAM HYDROBROMIDE 10 MG PO TABS
40.0000 mg | ORAL_TABLET | Freq: Every day | ORAL | Status: DC
  Administered 2017-11-21: 40 mg via ORAL
  Filled 2017-11-20: qty 4

## 2017-11-20 MED ORDER — INSULIN ASPART 100 UNIT/ML ~~LOC~~ SOLN: 14.0000 [IU] | Freq: Two times a day (BID) | SUBCUTANEOUS | Status: DC

## 2017-11-20 MED ORDER — LABETALOL HCL 200 MG PO TABS
400.0000 mg | ORAL_TABLET | Freq: Three times a day (TID) | ORAL | Status: DC
  Administered 2017-11-20 – 2017-11-21 (×2): 400 mg via ORAL
  Filled 2017-11-20 (×3): qty 2

## 2017-11-20 MED ORDER — ZOLPIDEM TARTRATE 5 MG PO TABS: 5.0000 mg | ORAL_TABLET | Freq: Every evening | ORAL | Status: DC | PRN

## 2017-11-20 MED ORDER — MAGNESIUM OXIDE 400 (241.3 MG) MG PO TABS
400.0000 mg | ORAL_TABLET | Freq: Every day | ORAL | Status: DC
  Filled 2017-11-20: qty 1

## 2017-11-20 MED ORDER — PANTOPRAZOLE SODIUM 40 MG PO TBEC
40.0000 mg | DELAYED_RELEASE_TABLET | Freq: Every day | ORAL | Status: DC
  Administered 2017-11-20 – 2017-11-21 (×2): 40 mg via ORAL
  Filled 2017-11-20 (×2): qty 1

## 2017-11-20 MED ORDER — LORATADINE 10 MG PO TABS
10.0000 mg | ORAL_TABLET | Freq: Every day | ORAL | Status: DC
  Administered 2017-11-21: 10 mg via ORAL
  Filled 2017-11-20: qty 1

## 2017-11-20 MED ORDER — TACROLIMUS 1 MG PO CAPS
4.0000 mg | ORAL_CAPSULE | Freq: Two times a day (BID) | ORAL | Status: DC
  Administered 2017-11-20 – 2017-11-21 (×2): 4 mg via ORAL
  Filled 2017-11-20 (×2): qty 4

## 2017-11-20 MED ORDER — ACETAMINOPHEN 325 MG PO TABS: 650.0000 mg | ORAL_TABLET | ORAL | Status: DC | PRN

## 2017-11-20 MED ORDER — PREDNISONE 5 MG PO TABS
5.0000 mg | ORAL_TABLET | Freq: Every day | ORAL | Status: DC
  Administered 2017-11-21: 5 mg via ORAL
  Filled 2017-11-20: qty 1

## 2017-11-20 MED ORDER — AMLODIPINE BESYLATE 5 MG PO TABS
10.0000 mg | ORAL_TABLET | Freq: Every day | ORAL | Status: DC
  Administered 2017-11-21: 10 mg via ORAL
  Filled 2017-11-20: qty 2

## 2017-11-20 MED ORDER — INSULIN GLARGINE 100 UNIT/ML ~~LOC~~ SOLN
26.0000 [IU] | Freq: Every day | SUBCUTANEOUS | Status: DC
  Administered 2017-11-20: 26 [IU] via SUBCUTANEOUS
  Filled 2017-11-20 (×2): qty 0.26

## 2017-11-20 MED ORDER — ALBUTEROL SULFATE HFA 108 (90 BASE) MCG/ACT IN AERS: 2.0000 | INHALATION_SPRAY | Freq: Four times a day (QID) | RESPIRATORY_TRACT | Status: DC | PRN

## 2017-11-20 MED ORDER — AMITRIPTYLINE HCL 10 MG PO TABS
10.0000 mg | ORAL_TABLET | Freq: Every day | ORAL | Status: DC
  Administered 2017-11-20: 10 mg via ORAL
  Filled 2017-11-20: qty 1

## 2017-11-20 MED ORDER — SODIUM BICARBONATE 650 MG PO TABS
1300.0000 mg | ORAL_TABLET | Freq: Two times a day (BID) | ORAL | Status: DC
  Administered 2017-11-20 – 2017-11-21 (×2): 1300 mg via ORAL
  Filled 2017-11-20 (×2): qty 2
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
## 2017-11-20 NOTE — ED Triage Notes (Signed)
Pt states she started to feel suicidal a couple of days ago, has had these feeling in the past. Has plan to harm self with pills which she has access to. Pt crying, states "I'm just depressed real bad"

## 2017-11-20 NOTE — Progress Notes (Signed)
TTS consulted with Patriciaann Clan, PA who recommends inpt treatment. TTS to seek placement. [facility]P [facility], [place], [employer] and [employer] nurse have been advised of the disposition.   Lind Covert, MSW, LCSW Therapeutic Triage Specialist  [phone]

## 2017-11-20 NOTE — ED Provider Notes (Signed)
[facility] DEPT Provider Note   CSN: [id number] Arrival date & time: 11/20/17  1727     History   Chief Complaint Chief Complaint  Patient presents with  . Suicidal    HPI Linda Ellison is a 42 y.o. female with a PMHx of CAD, depression, asthma, previous anemia of CKD, previously ESRD on dialysis now s/p kidney transplant (10/2016), DM2, HTN, HLD, hyperthyroidism, and other conditions listed below, who presents to the ED with complaints of 2 days of suicidal ideations with a plan to overdose on pills.  Patient states that she has had some stress at home, argued with her fianc recently, and states that she has been feeling more depressed and has started having suicidal ideations.  She denies HI, AVH, illicit drug use, alcohol use, or tobacco use.  She is compliant with all of her medications, she has a very extensive list of medications but all are verified in the [facility] as well as on chart review from the [facility] providers she sees for her kidney and primary care (some in our system have had dose adjustments, but all the medications she's on are listed in her [facility] here).  Specifically she has been compliant with her psychiatric medications, which include citalopram 40 mg a day, Wellbutrin 300 mg a day (this recently was increased from 150mg  QD), and amitriptyline 10 mg at bedtime.  She took her citalopram and Wellbutrin this morning, and her amitriptyline last night.  She has no medical complaints at this time and is here voluntarily.  The history is provided by the patient and medical records. No language interpreter was used.    Past Medical History:  Diagnosis Date  . Allergy   . Anemia associated with chronic renal failure   . Anxiety   . Asthma   . Atypical chest pain    long-standing -- normal cardio cath 06-27-2012 and normal nuclear stress test 06-25-2016  . AV (arteriovenous fistula) (HCC)    for dialysis-- currently located left radiocephalic    . CAD (coronary artery disease) cardiologist-- dr Clydie Braun ([facility] in [place])   a. False positive stress echo 06/2012 at [facility] - cath with no obstructive CAD at [facility] (mild luminal irregularities in LAD, moderate diffuse disease in distal RCA).  . Depression   . Elevated lipids   . ESRD on hemodialysis [facility]) NEPHROLOGIST-  DR MATTINGLY   started dialysis 01/29/11: [facility] on MWF  . Gastroparesis   . GERD (gastroesophageal reflux disease)   . History of pneumonia    HCAP 04/ 2017  . Hyperlipidemia   . Hyperthyroidism   . Menorrhagia   . Other secondary hypertension    associated to diabetes -- followed by cardiologist ( dr Clydie Braun)    . Peripheral neuropathy   . PONV (postoperative nausea and vomiting)   . Pre-transplant evaluation for kidney transplant    [facility]  . Secondary hyperparathyroidism of renal origin ([place])   . Type 2 diabetes mellitus, with long-term current use of insulin ([place])    dx 1985    Patient Active Problem List   Diagnosis Date Noted  . Kidney transplant recipient 06/16/2017  . Steal syndrome of dialysis vascular access ([place]) 06/16/2017  . PNA (pneumonia) 09/08/2015  . Chest pain 07/12/2015  . Personal history of adenomatouscolonic polyps 10/26/2011  . Diabetes mellitus with nephropathy ([place]) 08/11/2011  . Diabetic nephropathy ([place]) 01/26/2011  . Obesity, unspecified 08/08/2008  .  GERD 08/08/2008  . Essential hypertension 04/06/1999    Past Surgical History:  Procedure Laterality Date  . AV FISTULA PLACEMENT  08/2010   Left radiocephalic AVF  . AV FISTULA PLACEMENT Right 05/07/2014   Procedure: ARTERIOVENOUS (AV) FISTULA CREATION;  Surgeon: Elam Dutch, MD;  Location: [facility] OR;  Service: Vascular;  Laterality: Right;  . AV FISTULA PLACEMENT Right 07/02/2014   Procedure: INSERTION OF ARTERIOVENOUS (AV) GORE-TEX GRAFT ARM;  Surgeon: Elam Dutch, MD;  Location:  [facility];  Service: Vascular;  Laterality: Right;  . CARDIOVASCULAR STRESS TEST  06/25/2016   [place]   normal nuclear perfusion study w/ no ischemia/  normal LV function and wall motion , ef 51%  . CESAREAN SECTION  03/22/2001   w/ Bilateral Tubal Ligation  . COLONOSCOPY    . COLONOSCOPY WITH ESOPHAGOGASTRODUODENOSCOPY (EGD)  10/19/2011  . DILATION AND CURETTAGE OF UTERUS  05/12/2000   w/ suction for missed ab  . DOBUTAMINE STRESS ECHO  06/04/2016    [facility]   normal stress echo w/ no chest pain or ischemia/  normal LV function and wall motion , stress ef 60-65%  . ESOPHAGOGASTRODUODENOSCOPY  10/19/2011   Dr. Silvano Rusk  . FEMUR IM NAIL Left child   removed  . FOOT SURGERY Left 2014 approx.  Marland Kitchen HYSTEROSCOPY WITH NOVASURE N/A 08/19/2016   Procedure: HYSTEROSCOPY WITH NOVASURE;  Surgeon: Paula Compton, MD;  Location: [facility];  Service: Gynecology;  Laterality: N/A;  . KIDNEY TRANSPLANT  09/18/2016  . LAPAROSCOPIC CHOLECYSTECTOMY  1994  . LEFT HEART CATHETERIZATION WITH CORONARY ANGIOGRAM N/A 06/27/2012   Procedure: LEFT HEART CATHETERIZATION WITH CORONARY ANGIOGRAM;  Surgeon: Larey Dresser, MD;  Location: [facility] CATH LAB;  Service: Cardiovascular;  Laterality: N/A; No ostructive CAD, normal LVF, ef 55-60% (mild LAD luminal irregularities, moderate dRCA diffuse disease)  . LIGATION GORETEX FISTULA  01/04/11   Left AVF  . LIGATION OF ARTERIOVENOUS  FISTULA Left 08/21/2014   Procedure: LIGATION OF ARTERIOVENOUS  FISTULA;  Surgeon: Algernon Huxley, MD;  Location: [facility] ORS;  Service: Vascular;  Laterality: Left;  . LIGATION OF ARTERIOVENOUS  FISTULA Left 06/29/2017   Procedure: LIGATION OF ARTERIOVENOUS  FISTULA ( RADIOCEPHALIC );  Surgeon: Algernon Huxley, MD;  Location: [facility] ORS;  Service: Vascular;  Laterality: Left;  . PERIPHERAL VASCULAR CATHETERIZATION Left 08/08/2014   Procedure: Upper Extremity Angiography;  Surgeon: Algernon Huxley, MD;  Location: [facility] CV LAB;  Service:  Cardiovascular;  Laterality: Left;  . PERIPHERAL VASCULAR CATHETERIZATION Left 08/08/2014   Procedure: Upper Extremity Intervention;  Surgeon: Algernon Huxley, MD;  Location: [facility] CV LAB;  Service: Cardiovascular;  Laterality: Left;  . PERIPHERAL VASCULAR CATHETERIZATION Left 03/08/2016   Procedure: A/V Fistulagram;  Surgeon: Algernon Huxley, MD;  Location: [place] CV LAB;  Service: Cardiovascular;  Laterality: Left;  . POLYPECTOMY    . THROMBECTOMY AND REVISION OF ARTERIOVENTOUS (AV) GORETEX  GRAFT Right 07/16/2014   Procedure: THROMBECTOMY  Right  arm  ARTERIOVENOUS  GORETEX  GRAFT;  Surgeon: Elam Dutch, MD;  Location: [facility];  Service: Vascular;  Laterality: Right;  . TRANSTHORACIC ECHOCARDIOGRAM  06/04/2016   mild concentric LVH, ef 55-60%,  mild TR,  borderline LAE,  trivial MR and PR     OB History    Gravida  3   Para  1   Term      Preterm  1   AB  1   Living  2  SAB  1   TAB      Ectopic      Multiple      Live Births  2            Home Medications    Prior to Admission medications   Medication Sig Start Date End Date Taking? Authorizing Provider  albuterol (PROVENTIL HFA;VENTOLIN HFA) 108 (90 Base) MCG/ACT inhaler Inhale 2 puffs every 6 (six) hours as needed into the lungs for wheezing or shortness of breath.   Yes [provider]  amitriptyline (ELAVIL) 10 MG tablet Take 10 mg at bedtime by mouth.   Yes [provider]  amLODipine (NORVASC) 5 MG tablet Take 10 mg by mouth daily after breakfast.    Yes [provider]  aspirin EC 81 MG tablet Take 81 mg by mouth daily after breakfast.    Yes [provider]  B-D ULTRAFINE III SHORT PEN 31G X 8 MM MISC 1 each as directed.  05/05/17  Yes [provider]  buPROPion (WELLBUTRIN XL) 300 MG 24 hr tablet Take 300 mg by mouth daily after breakfast. 10/31/17  Yes [provider]  cetirizine (ZYRTEC) 10 MG tablet Take 10 mg by mouth 2 (two) times daily.     Yes [provider]  citalopram (CELEXA) 40 MG tablet Take 40 mg by mouth daily after breakfast.    Yes [provider]  cloNIDine (CATAPRES) 0.3 MG tablet Take 0.3 mg by mouth 3 (three) times daily. 10/31/17  Yes [provider]  furosemide (LASIX) 40 MG tablet Take 40 mg by mouth 2 (two) times daily.    Yes [provider]  gabapentin (NEURONTIN) 300 MG capsule Take 300 mg by mouth at bedtime.    Yes [provider]  insulin aspart (NOVOLOG) 100 UNIT/ML FlexPen Inject 10-14 Units into the skin 3 (three) times daily with meals. 10 units with breakfast, 14 units with lunch and 14 units with supper per sliding scale   Yes [provider]  Insulin Glargine (BASAGLAR KWIKPEN) 100 UNIT/ML SOPN Inject 26 Units into the skin at bedtime. 11/16/17  Yes [provider]  labetalol (NORMODYNE) 200 MG tablet Take 400 mg by mouth 3 (three) times daily.  11/17/17  Yes [provider]  magnesium oxide (MAG-OX) 400 MG tablet Take 400 mg daily by mouth. Take at noon   Yes [provider]  montelukast (SINGULAIR) 10 MG tablet Take 10 mg at bedtime by mouth.   Yes [provider]  Mouthwash Compounding Base (MOUTHWASH-AF PO) Take 30 mLs by mouth 4 (four) times daily as needed (pain). musositis mouthwash-viscous lidocaine    Yes [provider]  mycophenolate (MYFORTIC) 180 MG EC tablet Take 720 mg 2 (two) times daily by mouth.   Yes [provider]  omeprazole (PRILOSEC) 20 MG capsule Take 20 mg by mouth 2 (two) times daily before a meal.    Yes [provider]  predniSONE (DELTASONE) 5 MG tablet Take 5 mg daily with breakfast by mouth.   Yes [provider]  simvastatin (ZOCOR) 10 MG tablet Take 10 mg by mouth at bedtime. 11/16/17  Yes [provider]  sodium bicarbonate 650 MG tablet Take 1,300 mg by mouth 2 (two) times daily.    Yes [provider]    sulfamethoxazole-trimethoprim (BACTRIM,SEPTRA) 400-80 MG tablet Take 1 tablet 3 (three) times a week by mouth. Monday, Wednesday, and Friday   Yes [provider]  tacrolimus (PROGRAF) 1 MG  capsule Take 4 mg 2 (two) times daily by mouth.   Yes [provider]  traMADol (ULTRAM) 50 MG tablet Take 1 tablet (50 mg total) by mouth every 6 (six) hours as needed for moderate pain or severe pain. 06/29/17  Yes Dew, Erskine Squibb, MD    Family History Family History  Problem Relation Age of Onset  . Heart disease Mother        heart attack at 75 y/o-infected valve  . Kidney disease Mother        was a dialysis patient  . Heart disease Brother   . Kidney disease Maternal Grandmother        pre-dialysis  . Diabetes Father   . Hypertension Father   . Colon cancer Paternal Grandmother   . Colon polyps Paternal Grandmother   . Esophageal cancer Paternal Grandmother   . Rectal cancer Paternal Grandmother   . Stomach cancer Paternal Grandmother   . Kidney failure Paternal Uncle   . Kidney failure Paternal Aunt   . Arthritis Brother   . Diabetes Maternal Aunt   . Hypertension Maternal Aunt   . Diabetes Paternal Aunt   . Heart disease Paternal Aunt   . Hypertension Paternal Aunt     Social History Social History   Tobacco Use  . Smoking status: Never Smoker  . Smokeless tobacco: Never Used  Substance Use Topics  . Alcohol use: No    Alcohol/week: 0.0 standard drinks  . Drug use: No     Allergies   Ciprofloxacin; Fluocinolone; Hydromorphone; Pineapple; Strawberry extract; Phenytoin sodium extended; Tylenol [acetaminophen]; Adhesive [tape]; Chlorhexidine gluconate; Clindamycin; Oxycodone; and Shrimp [shellfish allergy]   Review of Systems Review of Systems  Constitutional: Negative for chills and fever.  Respiratory: Negative for shortness of breath.   Cardiovascular: Negative for chest pain.  Gastrointestinal: Negative for abdominal pain, constipation, diarrhea,  nausea and vomiting.  Genitourinary: Negative for dysuria and hematuria.  Musculoskeletal: Negative for arthralgias and myalgias.  Skin: Negative for color change.  Allergic/Immunologic: Positive for immunocompromised state (DM2).  Neurological: Negative for weakness and numbness.  Psychiatric/Behavioral: Positive for suicidal ideas. Negative for confusion and hallucinations.   All other systems reviewed and are negative for acute change except as noted in the HPI.    Physical Exam Updated Vital Signs BP (!) 151/96 (BP Location: Right Arm)   Pulse 89   Temp 98.4 F (36.9 C) (Oral)   Resp 20   Ht 5\' 6"  (1.676 m)   Wt 111.1 kg   SpO2 98%   BMI 39.54 kg/m   Physical Exam  Constitutional: She is oriented to person, place, and time. Vital signs are normal. She appears well-developed and well-nourished.  Non-toxic appearance. No distress.  Afebrile, nontoxic, NAD, mildly hypertensive similar to prior visits  HENT:  Head: Normocephalic and atraumatic.  Mouth/Throat: Oropharynx is clear and moist and mucous membranes are normal.  Eyes: Conjunctivae and EOM are normal. Right eye exhibits no discharge. Left eye exhibits no discharge.  Neck: Normal range of motion. Neck supple.  Cardiovascular: Normal rate, regular rhythm, normal heart sounds and intact distal pulses. Exam reveals no gallop and no friction rub.  No murmur heard. Pulmonary/Chest: Effort normal and breath sounds normal. No respiratory distress. She has no decreased breath sounds. She has no wheezes. She has no rhonchi. She has no rales.  Abdominal: Soft. Normal appearance and bowel sounds are normal. She exhibits no distension. There is no tenderness. There is no rigidity, no rebound, no guarding,  no CVA tenderness, no tenderness at McBurney's point and negative Murphy's sign.  Musculoskeletal: Normal range of motion.  Neurological: She is alert and oriented to person, place, and time. She has normal strength. No sensory  deficit.  Skin: Skin is warm, dry and intact. No rash noted.  Psychiatric: She is not actively hallucinating. She exhibits a depressed mood. She expresses suicidal ideation. She expresses no homicidal ideation. She expresses suicidal plans. She expresses no homicidal plans.  Depressed affect, but pleasant and cooperative. Endorsing SI with a plan, denies HI or AVH, doesn't seem to be responding to internal stimuli.   Nursing note and vitals reviewed.    ED Treatments / Results  Labs (all labs ordered are listed, but only abnormal results are displayed) Labs Reviewed  COMPREHENSIVE METABOLIC PANEL - Abnormal; Notable for the following components:      Result Value   Glucose, Bld 126 (*)    Creatinine, Ser 1.19 (*)    GFR calc non Af Amer 56 (*)    All other components within normal limits  ACETAMINOPHEN LEVEL - Abnormal; Notable for the following components:   Acetaminophen (Tylenol), Serum <10 (*)    All other components within normal limits  RAPID URINE DRUG SCREEN, HOSP PERFORMED - Abnormal; Notable for the following components:   Opiates   (*)    Value: Result not available. Reagent lot number recalled by manufacturer.   All other components within normal limits  ETHANOL  SALICYLATE LEVEL  CBC  I-STAT BETA HCG BLOOD, ED ([facility], [facility], [facility] ONLY)    EKG None  Radiology No results found.  Procedures Procedures (including critical care time)  Medications Ordered in ED Medications  albuterol (PROVENTIL HFA;VENTOLIN HFA) 108 (90 Base) MCG/ACT inhaler 2 puff (has no administration in time range)  amitriptyline (ELAVIL) tablet 10 mg (has no administration in time range)  amLODipine (NORVASC) tablet 10 mg (has no administration in time range)  aspirin EC tablet 81 mg (has no administration in time range)  buPROPion (WELLBUTRIN XL) 24 hr tablet 300 mg (has no administration in time range)  loratadine (CLARITIN) tablet 10 mg (has no administration in time range)  citalopram (CELEXA)  tablet 40 mg (has no administration in time range)  cloNIDine (CATAPRES) tablet 0.3 mg (has no administration in time range)  furosemide (LASIX) tablet 20 mg (has no administration in time range)  gabapentin (NEURONTIN) capsule 300 mg (has no administration in time range)  insulin aspart (NOVOLOG) FlexPen 10-14 Units (has no administration in time range)  BASAGLAR KWIKPEN KwikPen 26 Units (has no administration in time range)  labetalol (NORMODYNE) tablet 400 mg (has no administration in time range)  magnesium oxide (MAG-OX) tablet 400 mg (has no administration in time range)  montelukast (SINGULAIR) tablet 10 mg (has no administration in time range)  mycophenolate (MYFORTIC) EC tablet 720 mg (has no administration in time range)  pantoprazole (PROTONIX) EC tablet 40 mg (has no administration in time range)  predniSONE (DELTASONE) tablet 5 mg (has no administration in time range)  simvastatin (ZOCOR) tablet 10 mg (has no administration in time range)  sodium bicarbonate tablet 1,300 mg (has no administration in time range)  sulfamethoxazole-trimethoprim (BACTRIM,SEPTRA) 400-80 MG per tablet 1 tablet (has no administration in time range)  tacrolimus (PROGRAF) capsule 4 mg (has no administration in time range)  traMADol (ULTRAM) tablet 50 mg (has no administration in time range)  acetaminophen (TYLENOL) tablet 650 mg (has no administration in time range)  zolpidem (AMBIEN) tablet 5 mg (  has no administration in time range)  ondansetron (ZOFRAN) tablet 4 mg (has no administration in time range)  alum & mag hydroxide-simeth (MAALOX/MYLANTA) 200-200-20 MG/5ML suspension 30 mL (has no administration in time range)     Initial Impression / Assessment and Plan / ED Course  I have reviewed the triage vital signs and the nursing notes.  Pertinent labs & imaging results that were available during my care of the patient were reviewed by me and considered in my medical decision making (see chart for  details).     42 y.o. female here with SI with a plan to OD, denies HI/AVH, no illicit drugs or EtOH use, nonsmoker. Compliant with all of her medications including citalopram, wellbutrin, and amitriptyline. Has no medical complaints at this time. On exam, mildly hypertensive similar to prior visits, depressed mood/affect, but otherwise benign exam. Will get psych clearance labs and reassess shortly.   7:29 PM CBC WNL. CMP with stable kidney function Cr 1.19, and mildly elevated gluc 126 but otherwise unremarkable. EtOH level undetectable. Salicylate and acetaminophen levels WNL. BetaHCG neg. UDS negative. Pt medically cleared at this time. Psych hold orders and home med orders placed. Please see TTS notes for further documentation of care/dispo. PLEASE NOTE THAT PT IS HERE VOLUNTARILY AT THIS TIME, IF PT TRIES TO LEAVE THEY WOULD NEED IVC PAPERWORK TAKEN OUT. Pt stable at time of med clearance.     Final Clinical Impressions(s) / ED Diagnoses   Final diagnoses:  Suicidal ideation  Medical clearance for psychiatric admission    ED Discharge Orders    [address] [place], [place] 11/20/17 1930    Pattricia Boss, MD 11/25/17 1159

## 2017-11-21 ENCOUNTER — Other Ambulatory Visit: Payer: Self-pay

## 2017-11-21 ENCOUNTER — Inpatient Hospital Stay (HOSPITAL_COMMUNITY)
Admission: AD | Admit: 2017-11-21 | Discharge: 2017-11-25 | DRG: 885 | Disposition: A | Discharge status: Home / Self Care | Payer: Medicare Other | Source: Intra-hospital | Attending: Psychiatry | Admitting: Psychiatry

## 2017-11-21 ENCOUNTER — Encounter (HOSPITAL_COMMUNITY): Payer: Self-pay | Admitting: *Deleted

## 2017-11-21 DIAGNOSIS — T50902A Poisoning by unspecified drugs, medicaments and biological substances, intentional self-harm, initial encounter: Secondary | ICD-10-CM | POA: Diagnosis not present

## 2017-11-21 DIAGNOSIS — T1491XA Suicide attempt, initial encounter: Secondary | ICD-10-CM | POA: Diagnosis not present

## 2017-11-21 DIAGNOSIS — Z91410 Personal history of adult physical and sexual abuse: Secondary | ICD-10-CM

## 2017-11-21 DIAGNOSIS — Z883 Allergy status to other anti-infective agents status: Secondary | ICD-10-CM

## 2017-11-21 DIAGNOSIS — F333 Major depressive disorder, recurrent, severe with psychotic symptoms: Principal | ICD-10-CM | POA: Diagnosis present

## 2017-11-21 DIAGNOSIS — Z91048 Other nonmedicinal substance allergy status: Secondary | ICD-10-CM

## 2017-11-21 DIAGNOSIS — Z7982 Long term (current) use of aspirin: Secondary | ICD-10-CM | POA: Diagnosis not present

## 2017-11-21 DIAGNOSIS — J449 Chronic obstructive pulmonary disease, unspecified: Secondary | ICD-10-CM | POA: Diagnosis present

## 2017-11-21 DIAGNOSIS — Z888 Allergy status to other drugs, medicaments and biological substances status: Secondary | ICD-10-CM

## 2017-11-21 DIAGNOSIS — Z86010 Personal history of colonic polyps: Secondary | ICD-10-CM

## 2017-11-21 DIAGNOSIS — Z940 Kidney transplant status: Secondary | ICD-10-CM | POA: Diagnosis not present

## 2017-11-21 DIAGNOSIS — G4700 Insomnia, unspecified: Secondary | ICD-10-CM | POA: Diagnosis present

## 2017-11-21 DIAGNOSIS — R45851 Suicidal ideations: Secondary | ICD-10-CM | POA: Diagnosis present

## 2017-11-21 DIAGNOSIS — K219 Gastro-esophageal reflux disease without esophagitis: Secondary | ICD-10-CM | POA: Diagnosis present

## 2017-11-21 DIAGNOSIS — Z560 Unemployment, unspecified: Secondary | ICD-10-CM | POA: Diagnosis not present

## 2017-11-21 DIAGNOSIS — Z91018 Allergy to other foods: Secondary | ICD-10-CM | POA: Diagnosis not present

## 2017-11-21 DIAGNOSIS — Z841 Family history of disorders of kidney and ureter: Secondary | ICD-10-CM | POA: Diagnosis not present

## 2017-11-21 DIAGNOSIS — Z79899 Other long term (current) drug therapy: Secondary | ICD-10-CM

## 2017-11-21 DIAGNOSIS — R41843 Psychomotor deficit: Secondary | ICD-10-CM | POA: Diagnosis present

## 2017-11-21 DIAGNOSIS — E785 Hyperlipidemia, unspecified: Secondary | ICD-10-CM | POA: Diagnosis present

## 2017-11-21 DIAGNOSIS — E119 Type 2 diabetes mellitus without complications: Secondary | ICD-10-CM | POA: Diagnosis not present

## 2017-11-21 DIAGNOSIS — I10 Essential (primary) hypertension: Secondary | ICD-10-CM | POA: Diagnosis present

## 2017-11-21 DIAGNOSIS — Z23 Encounter for immunization: Secondary | ICD-10-CM

## 2017-11-21 DIAGNOSIS — F4310 Post-traumatic stress disorder, unspecified: Secondary | ICD-10-CM

## 2017-11-21 LAB — GLUCOSE, CAPILLARY
GLUCOSE-CAPILLARY: 235 mg/dL — AB (ref 70–99)
Glucose-Capillary: 171 mg/dL — ABNORMAL HIGH (ref 70–99)

## 2017-11-21 LAB — CBG MONITORING, ED
Glucose-Capillary: 150 mg/dL — ABNORMAL HIGH (ref 70–99)
Glucose-Capillary: 67 mg/dL — ABNORMAL LOW (ref 70–99)
Glucose-Capillary: 92 mg/dL (ref 70–99)

## 2017-11-21 LAB — HEMOGLOBIN A1C
Hgb A1c MFr Bld: 9.9 % — ABNORMAL HIGH (ref 4.8–5.6)
MEAN PLASMA GLUCOSE: 237.43 mg/dL

## 2017-11-21 MED ORDER — INSULIN ASPART 100 UNIT/ML ~~LOC~~ SOLN
10.0000 [IU] | Freq: Every day | SUBCUTANEOUS | Status: DC
  Administered 2017-11-22 – 2017-11-25 (×4): 10 [IU] via SUBCUTANEOUS

## 2017-11-21 MED ORDER — PANTOPRAZOLE SODIUM 40 MG PO TBEC
40.0000 mg | DELAYED_RELEASE_TABLET | Freq: Every day | ORAL | Status: DC
  Administered 2017-11-22 – 2017-11-25 (×4): 40 mg via ORAL
  Filled 2017-11-21 (×6): qty 1

## 2017-11-21 MED ORDER — INSULIN ASPART 100 UNIT/ML ~~LOC~~ SOLN
0.0000 [IU] | Freq: Every day | SUBCUTANEOUS | Status: DC
  Administered 2017-11-22 – 2017-11-24 (×2): 2 [IU] via SUBCUTANEOUS

## 2017-11-21 MED ORDER — SIMVASTATIN 10 MG PO TABS
10.0000 mg | ORAL_TABLET | Freq: Every day | ORAL | Status: DC
  Administered 2017-11-21 – 2017-11-24 (×4): 10 mg via ORAL
  Filled 2017-11-21 (×6): qty 1

## 2017-11-21 MED ORDER — CLONIDINE HCL 0.3 MG PO TABS
0.3000 mg | ORAL_TABLET | Freq: Three times a day (TID) | ORAL | Status: DC
  Administered 2017-11-21 – 2017-11-25 (×11): 0.3 mg via ORAL
  Filled 2017-11-21 (×17): qty 1

## 2017-11-21 MED ORDER — INSULIN GLARGINE 100 UNIT/ML ~~LOC~~ SOLN
26.0000 [IU] | Freq: Every day | SUBCUTANEOUS | Status: DC
  Administered 2017-11-21 – 2017-11-24 (×4): 26 [IU] via SUBCUTANEOUS

## 2017-11-21 MED ORDER — MAGNESIUM OXIDE 400 (241.3 MG) MG PO TABS
400.0000 mg | ORAL_TABLET | Freq: Every day | ORAL | Status: DC
  Administered 2017-11-22 – 2017-11-25 (×4): 400 mg via ORAL
  Filled 2017-11-21 (×5): qty 1

## 2017-11-21 MED ORDER — MONTELUKAST SODIUM 10 MG PO TABS
10.0000 mg | ORAL_TABLET | Freq: Every day | ORAL | Status: DC
  Administered 2017-11-21 – 2017-11-24 (×4): 10 mg via ORAL
  Filled 2017-11-21 (×6): qty 1

## 2017-11-21 MED ORDER — MYCOPHENOLATE SODIUM 180 MG PO TBEC
720.0000 mg | DELAYED_RELEASE_TABLET | Freq: Two times a day (BID) | ORAL | Status: DC
  Administered 2017-11-21 – 2017-11-25 (×8): 720 mg via ORAL
  Filled 2017-11-21 (×12): qty 4

## 2017-11-21 MED ORDER — INSULIN ASPART 100 UNIT/ML ~~LOC~~ SOLN
0.0000 [IU] | Freq: Three times a day (TID) | SUBCUTANEOUS | Status: DC
  Administered 2017-11-21 – 2017-11-22 (×3): 3 [IU] via SUBCUTANEOUS
  Administered 2017-11-23: 2 [IU] via SUBCUTANEOUS
  Administered 2017-11-23: 3 [IU] via SUBCUTANEOUS
  Administered 2017-11-23: 2 [IU] via SUBCUTANEOUS
  Administered 2017-11-24: 1 [IU] via SUBCUTANEOUS
  Administered 2017-11-24: 5 [IU] via SUBCUTANEOUS
  Administered 2017-11-24: 3 [IU] via SUBCUTANEOUS
  Administered 2017-11-25 (×2): 2 [IU] via SUBCUTANEOUS

## 2017-11-21 MED ORDER — AMLODIPINE BESYLATE 10 MG PO TABS
10.0000 mg | ORAL_TABLET | Freq: Every day | ORAL | Status: DC
  Administered 2017-11-22 – 2017-11-25 (×4): 10 mg via ORAL
  Filled 2017-11-21 (×6): qty 1

## 2017-11-21 MED ORDER — AMITRIPTYLINE HCL 10 MG PO TABS
10.0000 mg | ORAL_TABLET | Freq: Every day | ORAL | Status: DC
  Administered 2017-11-21 – 2017-11-24 (×4): 10 mg via ORAL
  Filled 2017-11-21 (×6): qty 1

## 2017-11-21 MED ORDER — SULFAMETHOXAZOLE-TRIMETHOPRIM 400-80 MG PO TABS
1.0000 | ORAL_TABLET | ORAL | Status: DC
  Administered 2017-11-23 – 2017-11-25 (×2): 1 via ORAL
  Filled 2017-11-21 (×2): qty 1

## 2017-11-21 MED ORDER — ALBUTEROL SULFATE HFA 108 (90 BASE) MCG/ACT IN AERS: 2.0000 | INHALATION_SPRAY | Freq: Four times a day (QID) | RESPIRATORY_TRACT | Status: DC | PRN

## 2017-11-21 MED ORDER — PNEUMOCOCCAL VAC POLYVALENT 25 MCG/0.5ML IJ INJ
0.5000 mL | INJECTION | INTRAMUSCULAR | Status: AC
  Administered 2017-11-22: 0.5 mL via INTRAMUSCULAR

## 2017-11-21 MED ORDER — GABAPENTIN 300 MG PO CAPS
300.0000 mg | ORAL_CAPSULE | Freq: Every day | ORAL | Status: DC
  Administered 2017-11-21 – 2017-11-24 (×4): 300 mg via ORAL
  Filled 2017-11-21 (×6): qty 1

## 2017-11-21 MED ORDER — ZOLPIDEM TARTRATE 5 MG PO TABS: 5.0000 mg | ORAL_TABLET | Freq: Every evening | ORAL | Status: DC | PRN

## 2017-11-21 MED ORDER — INSULIN ASPART 100 UNIT/ML ~~LOC~~ SOLN
14.0000 [IU] | Freq: Two times a day (BID) | SUBCUTANEOUS | Status: DC
  Administered 2017-11-21 – 2017-11-25 (×7): 14 [IU] via SUBCUTANEOUS

## 2017-11-21 MED ORDER — PREDNISONE 5 MG PO TABS
5.0000 mg | ORAL_TABLET | Freq: Every day | ORAL | Status: DC
  Administered 2017-11-22 – 2017-11-25 (×4): 5 mg via ORAL
  Filled 2017-11-21 (×6): qty 1

## 2017-11-21 MED ORDER — LORATADINE 10 MG PO TABS
10.0000 mg | ORAL_TABLET | Freq: Every day | ORAL | Status: DC
  Administered 2017-11-22 – 2017-11-25 (×4): 10 mg via ORAL
  Filled 2017-11-21 (×6): qty 1

## 2017-11-21 MED ORDER — MAGNESIUM HYDROXIDE 400 MG/5ML PO SUSP: 30.0000 mL | Freq: Every day | ORAL | Status: DC | PRN

## 2017-11-21 MED ORDER — FUROSEMIDE 20 MG PO TABS
20.0000 mg | ORAL_TABLET | Freq: Every day | ORAL | Status: DC
  Administered 2017-11-22 – 2017-11-25 (×4): 20 mg via ORAL
  Filled 2017-11-21 (×6): qty 1

## 2017-11-21 MED ORDER — CITALOPRAM HYDROBROMIDE 40 MG PO TABS
40.0000 mg | ORAL_TABLET | Freq: Every day | ORAL | Status: DC
  Administered 2017-11-22 – 2017-11-25 (×4): 40 mg via ORAL
  Filled 2017-11-21 (×6): qty 1

## 2017-11-21 MED ORDER — TACROLIMUS 1 MG PO CAPS
4.0000 mg | ORAL_CAPSULE | Freq: Two times a day (BID) | ORAL | Status: DC
  Administered 2017-11-21 – 2017-11-23 (×4): 4 mg via ORAL
  Filled 2017-11-21 (×6): qty 4

## 2017-11-21 MED ORDER — ASPIRIN EC 81 MG PO TBEC
81.0000 mg | DELAYED_RELEASE_TABLET | Freq: Every day | ORAL | Status: DC
  Administered 2017-11-22 – 2017-11-25 (×4): 81 mg via ORAL
  Filled 2017-11-21 (×6): qty 1

## 2017-11-21 MED ORDER — SODIUM BICARBONATE 650 MG PO TABS
1300.0000 mg | ORAL_TABLET | Freq: Two times a day (BID) | ORAL | Status: DC
  Administered 2017-11-21 – 2017-11-25 (×8): 1300 mg via ORAL
  Filled 2017-11-21 (×12): qty 2

## 2017-11-21 MED ORDER — ONDANSETRON HCL 4 MG PO TABS: 4.0000 mg | ORAL_TABLET | Freq: Three times a day (TID) | ORAL | Status: DC | PRN

## 2017-11-21 MED ORDER — LABETALOL HCL 200 MG PO TABS
400.0000 mg | ORAL_TABLET | Freq: Three times a day (TID) | ORAL | Status: DC
  Administered 2017-11-21 – 2017-11-25 (×12): 400 mg via ORAL
  Filled 2017-11-21 (×17): qty 2

## 2017-11-21 MED ORDER — ALUM & MAG HYDROXIDE-SIMETH 200-200-20 MG/5ML PO SUSP: 30.0000 mL | Freq: Four times a day (QID) | ORAL | Status: DC | PRN

## 2017-11-21 MED ORDER — BUPROPION HCL ER (XL) 300 MG PO TB24
300.0000 mg | ORAL_TABLET | Freq: Every day | ORAL | Status: DC
  Administered 2017-11-22: 300 mg via ORAL
  Filled 2017-11-21 (×3): qty 1

## 2017-11-21 NOTE — Tx Team (Signed)
Initial Treatment Plan 11/21/2017 2:36 PM SIEARA BREMER NOM:[id number]    PATIENT STRESSORS: Health problems Marital or family conflict   PATIENT STRENGTHS: Ability for insight Average or above average intelligence Capable of independent living General fund of knowledge   PATIENT IDENTIFIED PROBLEMS: Depression Anger Suicidal thoughts A/V hallucinations "help me deal with my depression better"                     DISCHARGE CRITERIA:  Ability to meet basic life and health needs Improved stabilization in mood, thinking, and/or behavior Reduction of life-threatening or endangering symptoms to within safe limits Verbal commitment to aftercare and medication compliance  PRELIMINARY DISCHARGE PLAN: Attend aftercare/continuing care group Return to previous living arrangement  PATIENT/FAMILY INVOLVEMENT: This treatment plan has been presented to and reviewed with the patient, Linda Ellison, and/or family member, .  The patient and family have been given the opportunity to ask questions and make suggestions.  Gwendelyn Lanting, [place], [place] 11/21/2017, 2:36 PM

## 2017-11-21 NOTE — Progress Notes (Signed)
Adult Psychoeducational Group Note  Date:  11/21/2017 Time:  9:07 PM  Group Topic/Focus:  Wrap-Up Group:   The focus of this group is to help patients review their daily goal of treatment and discuss progress on daily workbooks.  Participation Level:  Active  Participation Quality:  Appropriate  Affect:  Appropriate  Cognitive:  Appropriate  Insight: Appropriate  Engagement in Group:  Engaged  Modes of Intervention:  Discussion  Additional Comments:  The patient expressed that she rates today a 7.The patient also said that her goal is to not feel lonely.  Linda Ellison 11/21/2017, 9:07 PM

## 2017-11-21 NOTE — Progress Notes (Signed)
Patient ID: Linda Ellison, female   DOB: 04-Oct-1975, 42 y.o.   MRN: [id number] Per State regulations 482.30 this chart was reviewed for medical necessity with respect to the patient's admission/duration of stay.    Next review date: 11/25/17  Debarah Crape, BSN, RN-BC  Case Manager

## 2017-11-21 NOTE — Progress Notes (Signed)
[facility] called for transport to [facility]. Patient is stable at transfer.

## 2017-11-21 NOTE — BH Assessment (Addendum)
[facility] Assessment Progress Note  Per Patriciaann Clan, PA, this pt requires psychiatric hospitalization at this time.  Leonia Reader, RN, [facility] has assigned pt to [facility] Rm 502-2.  Pt has signed Voluntary Admission and Consent for Treatment, as well as Consent to Release Information to her providers at [facility], as well as her fiance, and a notification call has been placed to the providers.  Signed forms have been faxed to [facility].  Pt's nurse, Geni Bers, has been notified, and agrees to send original paperwork along with pt via [facility], and to call report to [phone].  Jalene Mullet, [place] [facility] Coordinator [phone]   Addendum:  Dimple Nanas reports that [facility] will be ready to receive pt at 13:00.  Geni Bers has been notified.  Jalene Mullet, [place] Coordinator [phone]

## 2017-11-21 NOTE — Progress Notes (Signed)
Linda Ellison is a 42 year old female pt admitted on voluntary basis. On admission, she reports that she has been feeling depressed and suicidal. She does report having an argument with her fiancee that she her off but does report on-going depression before then. She reports that she sees Patriciaann Clan, [place] for medication management and reports that she takes all her medications as prescribed. She denies any alcohol or drug usage. She reports that she lives with her fiancee and reports that she will return there once she is discharged. [name] was oriented to the unit and safety maintained.

## 2017-11-21 NOTE — Progress Notes (Signed)
Report given to [facility] nurse. Transport scheduled at 1300. Patient is stable.

## 2017-11-22 DIAGNOSIS — F4310 Post-traumatic stress disorder, unspecified: Secondary | ICD-10-CM

## 2017-11-22 DIAGNOSIS — F333 Major depressive disorder, recurrent, severe with psychotic symptoms: Principal | ICD-10-CM

## 2017-11-22 LAB — GLUCOSE, CAPILLARY
GLUCOSE-CAPILLARY: 202 mg/dL — AB (ref 70–99)
GLUCOSE-CAPILLARY: 52 mg/dL — AB (ref 70–99)
Glucose-Capillary: 131 mg/dL — ABNORMAL HIGH (ref 70–99)
Glucose-Capillary: 234 mg/dL — ABNORMAL HIGH (ref 70–99)
Glucose-Capillary: 231 mg/dL — ABNORMAL HIGH (ref 70–99)

## 2017-11-22 MED ORDER — ARIPIPRAZOLE 10 MG PO TABS
10.0000 mg | ORAL_TABLET | Freq: Every day | ORAL | Status: DC
  Administered 2017-11-22 – 2017-11-25 (×4): 10 mg via ORAL
  Filled 2017-11-22 (×6): qty 1

## 2017-11-22 MED ORDER — BUPROPION HCL ER (XL) 150 MG PO TB24
150.0000 mg | ORAL_TABLET | Freq: Every day | ORAL | Status: DC
  Administered 2017-11-23 – 2017-11-25 (×3): 150 mg via ORAL
  Filled 2017-11-22 (×5): qty 1
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
## 2017-11-22 NOTE — Plan of Care (Signed)
  Problem: Activity: Goal: Interest or engagement in activities will improve Outcome: Progressing-she attended evening wrap up group.

## 2017-11-22 NOTE — Plan of Care (Signed)
  Problem: Activity: Goal: Interest or engagement in activities will improve Outcome: Progressing   Problem: Safety: Goal: Periods of time without injury will increase Outcome: Progressing  DAR NOTE: Patient presents with anxious affect and depressed mood.  Denies suicidal thoughts, pain, auditory and visual hallucinations.  Described energy level as low and concentration as good.  Rates depression at 4, hopelessness at 5, and anxiety at 5.  Maintained on routine safety checks.  Medications given as prescribed.  Support and encouragement offered as needed.  Attended group and participated.  States goal for today is "feeling accepted and having hope."  Patient observed socializing with peers in the dayroom.  Offered no complaint.

## 2017-11-22 NOTE — Progress Notes (Signed)
Recreation Therapy Notes  Animal-Assisted Activity (AAA) Program Checklist/Progress Notes Patient Eligibility Criteria Checklist & Daily Group note for Rec TxIntervention  Date: 8.20.19 Time: 1430 Location: [address]   AAA/T Program Assumption of Risk Form signed by Teacher, music or Parent Legal Guardian YES   Patient is free of allergies or sever asthma YES   Patient reports no fear of animals  YES  Patient reports no history of cruelty to animals YES   Patient understands his/her participation is voluntary YES  Patient washes hands before animal contact YES   Patient washes hands after animal contact YES   Behavioral Response: Engaged  Education:Hand Washing, Appropriate Animal Interaction   Education Outcome: Acknowledges understanding/In group clarification offered/Needs additional education.   Clinical Observations/Feedback: Pt attended group activity.   Victorino Sparrow, LRT/CTRS        Victorino Sparrow A 11/22/2017 3:38 PM

## 2017-11-22 NOTE — Progress Notes (Signed)
Hypoglycemic Event  CBG: 52  Treatment: 15 GM carbohydrate snack  Symptoms: None  Follow-up CBG: Time:1245 CBG Result:131  Possible Reasons for Event: Unknown  Comments/MD notified:MD (Dr Nancy Fetter) made aware.  Patient is alert and oriented x 4.     Linda Ellison
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
## 2017-11-22 NOTE — Progress Notes (Signed)
Recreation Therapy Notes  INPATIENT RECREATION THERAPY ASSESSMENT  Patient Details Name: Linda Ellison MRN: [id number] DOB: March 16, 1976 Today's Date: 11/22/2017       Information Obtained From: Patient  Able to Participate in Assessment/Interview: Yes  Patient Presentation: Alert, Oriented  Reason for Admission (Per Patient): Suicide Attempt  Patient Stressors: Other (Comment)(Loneliness, depressed and hopeless)  Coping Skills:   Isolation, Self-Injury, TV, Arguments, Aggression, Music, Exercise, Talk, Art, Prayer, Avoidance, Dance, Hot Bath/Shower  Leisure Interests (2+):  Community - Travel (Comment), Community - Geneticist, molecular, Social - Family(Pt stated going out to eat with family)  Frequency of Recreation/Participation: Weekly(Pt stated she trys to travel yearly and goes to the movies every once in awhile)  Awareness of Community Resources:  Yes  Community Resources:  Patent examiner, Engineer, building services, Other (Psychiatric nurse)  Current Use: Yes  If no, Barriers?:    Expressed Interest in [place]: No  [employer] of Residence:  Investment banker, corporate  Patient Main Form of Transportation: Musician  Patient Strengths:  Dealing with people; Good listener  Patient Identified Areas of Improvement:  Anger; Self esteem  Patient Goal for Hospitalization:  "Help myself to know I am important and I am somebody regardless of what others say"  Current SI (including self-harm):  No  Current HI:  No  Current AVH: No  Staff Intervention Plan: Group Attendance, Collaborate with Interdisciplinary Treatment Team  Consent to Intern Participation: N/A    Victorino Sparrow, LRT/CTRS  Victorino Sparrow A 11/22/2017, 12:45 PM
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
## 2017-11-22 NOTE — H&P (Signed)
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 lifestyle to reduce recurrence of condition will improve and Ability to identify and develop effective coping behaviors will improve  Physician Treatment Plan for Secondary Diagnosis: Principal Problem:   MDD (major depressive disorder), recurrent, severe, with psychosis ([place]) Active Problems:   PTSD (post-traumatic stress disorder)  Long Term Goal(s): Improvement in symptoms so as ready for discharge  Short Term Goals: Ability to identify and develop effective coping behaviors will improve  I certify that inpatient services furnished can reasonably be expected to improve the patient's condition.    Pennelope Bracken, MD 8/20/20193:16 PM

## 2017-11-22 NOTE — Progress Notes (Signed)
D:  Linda Ellison was up and visible on the unit.  She is interacting well with female peers.  She attended evening wrap up group.  She denied SI/HI or A/V hallucinations.  She stated she had a pretty good day.  She denied any pain or discomfort and appeared to be in no physical distress.  She took her hs medications without difficulty.  No prn's required this shift.  She is currently resting with her eyes closed and appears to be asleep. A:  1:1 with RN for support and encouragement.  Medications as ordered.  Q 15 minute checks maintained for safety.  Encouraged participation in group and unit activities.   R:  Linda Ellison remains safe on the unit.  We will continue to monitor the progress towards her goals.

## 2017-11-22 NOTE — Progress Notes (Signed)
Recreation Therapy Notes  Date: 8.20.19 Time: 1000 Location: 500 Hall Dayroom  Group Topic: Wellness  Goal Area(s) Addresses:  Patient will define components of whole wellness. Patient will verbalize benefit of whole wellness.  Behavioral Response: Engaged  Intervention: Music  Activity: Exercise.  LRT lead patients in a series of stretches before starting the exercises.  Once stretched, each patient was given the opportunity to lead the group in an exercise.  The group completed a series of 3 rounds of exercises.  Education: Wellness, Dentist.   Education Outcome: Acknowledges education/In group clarification offered/Needs additional education.   Clinical Observations/Feedback: Pt stated wellness was "being healthy".  Pt completed the exercises and expressed at one point that her bones were "snapping and popping".  Pt was bright and engaged throughout.  Pt left early to meet with doctor.    Victorino Sparrow, LRT/CTRS     Victorino Sparrow A 11/22/2017 11:46 AM

## 2017-11-23 LAB — GLUCOSE, CAPILLARY
Glucose-Capillary: 215 mg/dL — ABNORMAL HIGH (ref 70–99)
Glucose-Capillary: 153 mg/dL — ABNORMAL HIGH (ref 70–99)
Glucose-Capillary: 174 mg/dL — ABNORMAL HIGH (ref 70–99)
Glucose-Capillary: 161 mg/dL — ABNORMAL HIGH (ref 70–99)

## 2017-11-23 MED ORDER — TACROLIMUS 1 MG PO CAPS
4.0000 mg | ORAL_CAPSULE | Freq: Two times a day (BID) | ORAL | Status: DC
  Administered 2017-11-23 – 2017-11-25 (×4): 4 mg via ORAL
  Filled 2017-11-23 (×8): qty 4
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
## 2017-11-23 NOTE — Progress Notes (Signed)
Adult Psychoeducational Group Note  Date:  11/23/2017 Time:  9:27 PM  Group Topic/Focus:  Wrap-Up Group:   The focus of this group is to help patients review their daily goal of treatment and discuss progress on daily workbooks.  Participation Level:  Active  Participation Quality:  Appropriate  Affect:  Appropriate  Cognitive:  Appropriate  Insight: Appropriate  Engagement in Group:  Engaged  Modes of Intervention:  Discussion  Additional Comments:  The patient expressed that she rates today a 8 and attended group.The patient also said that her goal was to prepare for discharge.  Linda Ellison 11/23/2017, 9:27 PM

## 2017-11-23 NOTE — Progress Notes (Signed)
Patient denies SI, HI and AVH this shift.  Patient was compliant with medications and had no incidents of behavioral dyscontrol.  Patient attended groups and engaged in unit activities.   Assess patient for safety, offer medications as prescribed, engage patient in 1:1 staff talks.   Patient able to contract for safety. Continue to monitor as planned.  

## 2017-11-23 NOTE — Tx Team (Signed)
Interdisciplinary Treatment and Diagnostic Plan Update  11/23/2017 Time of Session: 9:43 AM  Linda Ellison MRN: [id number]  Principal Diagnosis: MDD (major depressive disorder), recurrent, severe, with psychosis ([place])  Secondary Diagnoses: Principal Problem:   MDD (major depressive disorder), recurrent, severe, with psychosis ([place]) Active Problems:   PTSD (post-traumatic stress disorder)   Current Medications:  Current Facility-Administered Medications  Medication Dose Route Frequency Provider Last Rate Last Dose  . albuterol (PROVENTIL HFA;VENTOLIN HFA) 108 (90 Base) MCG/ACT inhaler 2 puff  2 puff Inhalation Q6H PRN Patrecia Pour, NP      . alum & mag hydroxide-simeth (MAALOX/MYLANTA) 200-200-20 MG/5ML suspension 30 mL  30 mL Oral Q6H PRN Patrecia Pour, NP      . amitriptyline (ELAVIL) tablet 10 mg  10 mg Oral QHS Patrecia Pour, NP   10 mg at 11/22/17 2211  . amLODipine (NORVASC) tablet 10 mg  10 mg Oral QPC breakfast Patrecia Pour, NP   10 mg at 11/23/17 0819  . ARIPiprazole (ABILIFY) tablet 10 mg  10 mg Oral Daily Pennelope Bracken, MD   10 mg at 11/23/17 3536  . aspirin EC tablet 81 mg  81 mg Oral QPC breakfast Patrecia Pour, NP   81 mg at 11/23/17 0817  . buPROPion (WELLBUTRIN XL) 24 hr tablet 150 mg  150 mg Oral QPC breakfast Pennelope Bracken, MD   150 mg at 11/23/17 1443  . citalopram (CELEXA) tablet 40 mg  40 mg Oral QPC breakfast Patrecia Pour, NP   40 mg at 11/23/17 0818  . cloNIDine (CATAPRES) tablet 0.3 mg  0.3 mg Oral TID Patrecia Pour, NP   0.3 mg at 11/23/17 1540  . furosemide (LASIX) tablet 20 mg  20 mg Oral Daily Patrecia Pour, NP   20 mg at 11/23/17 0867  . gabapentin (NEURONTIN) capsule 300 mg  300 mg Oral QHS Patrecia Pour, NP   300 mg at 11/22/17 2205  . insulin aspart (novoLOG) injection 0-5 Units  0-5 Units Subcutaneous QHS Derrill Center, NP   2 Units at 11/22/17 2212  . insulin aspart (novoLOG) injection 0-9 Units  0-9 Units  Subcutaneous TID WC Derrill Center, NP   3 Units at 11/23/17 [phone]  . insulin aspart (novoLOG) injection 10 Units  10 Units Subcutaneous Q breakfast Patrecia Pour, NP   10 Units at 11/23/17 [phone]  . insulin aspart (novoLOG) injection 14 Units  14 Units Subcutaneous BID AC Patrecia Pour, NP   14 Units at 11/22/17 1730  . insulin glargine (LANTUS) injection 26 Units  26 Units Subcutaneous QHS Patrecia Pour, NP   26 Units at 11/22/17 2212  . labetalol (NORMODYNE) tablet 400 mg  400 mg Oral TID Patrecia Pour, NP   400 mg at 11/23/17 0818  . loratadine (CLARITIN) tablet 10 mg  10 mg Oral Daily Patrecia Pour, NP   10 mg at 11/23/17 0819  . magnesium hydroxide (MILK OF MAGNESIA) suspension 30 mL  30 mL Oral Daily PRN Patrecia Pour, NP      . magnesium oxide (MAG-OX) tablet 400 mg  400 mg Oral Q1200 Patrecia Pour, NP   400 mg at 11/22/17 1256  . montelukast (SINGULAIR) tablet 10 mg  10 mg Oral QHS Patrecia Pour, NP   10 mg at 11/22/17 2207  . mycophenolate (MYFORTIC) EC tablet 720 mg  720 mg Oral BID Patrecia Pour, NP  720 mg at 11/23/17 0823  . ondansetron (ZOFRAN) tablet 4 mg  4 mg Oral Q8H PRN Patrecia Pour, NP      . pantoprazole (PROTONIX) EC tablet 40 mg  40 mg Oral Daily Patrecia Pour, NP   40 mg at 11/23/17 0819  . predniSONE (DELTASONE) tablet 5 mg  5 mg Oral Q breakfast Patrecia Pour, NP   5 mg at 11/23/17 0315  . simvastatin (ZOCOR) tablet 10 mg  10 mg Oral QHS Patrecia Pour, NP   10 mg at 11/22/17 2205  . sodium bicarbonate tablet 1,300 mg  1,300 mg Oral BID Patrecia Pour, NP   1,300 mg at 11/23/17 0816  . sulfamethoxazole-trimethoprim (BACTRIM,SEPTRA) 400-80 MG per tablet 1 tablet  1 tablet Oral Once per day on Mon Wed Fri Patrecia Pour, NP   1 tablet at 11/23/17 9458  . tacrolimus (PROGRAF) capsule 4 mg  4 mg Oral BID Patrecia Pour, NP   4 mg at 11/23/17 0816  . zolpidem (AMBIEN) tablet 5 mg  5 mg Oral QHS PRN Patrecia Pour, NP        PTA  Medications: Medications Prior to Admission  Medication Sig Dispense Refill Last Dose  . albuterol (PROVENTIL HFA;VENTOLIN HFA) 108 (90 Base) MCG/ACT inhaler Inhale 2 puffs every 6 (six) hours as needed into the lungs for wheezing or shortness of breath.   unk  . amitriptyline (ELAVIL) 10 MG tablet Take 10 mg at bedtime by mouth.   11/19/2017 at Unknown time  . amLODipine (NORVASC) 5 MG tablet Take 10 mg by mouth daily after breakfast.    11/20/2017 at Unknown time  . aspirin EC 81 MG tablet Take 81 mg by mouth daily after breakfast.    11/20/2017 at Unknown time  . B-D ULTRAFINE III SHORT PEN 31G X 8 MM MISC 1 each as directed.   5 Taking  . buPROPion (WELLBUTRIN XL) 300 MG 24 hr tablet Take 300 mg by mouth daily after breakfast.  3 11/20/2017 at Unknown time  . cetirizine (ZYRTEC) 10 MG tablet Take 10 mg by mouth 2 (two) times daily.    11/20/2017 at Unknown time  . citalopram (CELEXA) 40 MG tablet Take 40 mg by mouth daily after breakfast.    11/20/2017 at Unknown time  . cloNIDine (CATAPRES) 0.3 MG tablet Take 0.3 mg by mouth 3 (three) times daily.  11 11/20/2017 at Unknown time  . furosemide (LASIX) 40 MG tablet Take 40 mg by mouth 2 (two) times daily.    11/20/2017 at Unknown time  . gabapentin (NEURONTIN) 300 MG capsule Take 300 mg by mouth at bedtime.    11/19/2017 at Unknown time  . insulin aspart (NOVOLOG) 100 UNIT/ML FlexPen Inject 10-14 Units into the skin 3 (three) times daily with meals. 10 units with breakfast, 14 units with lunch and 14 units with supper per sliding scale   11/20/2017 at 1430  . Insulin Glargine (BASAGLAR KWIKPEN) 100 UNIT/ML SOPN Inject 26 Units into the skin at bedtime.  5 11/19/2017 at Unknown time  . labetalol (NORMODYNE) 200 MG tablet Take 400 mg by mouth 3 (three) times daily.   5 11/20/2017 at 1500  . magnesium oxide (MAG-OX) 400 MG tablet Take 400 mg daily by mouth. Take at noon   11/20/2017 at Unknown time  . montelukast (SINGULAIR) 10 MG tablet Take 10 mg at bedtime by  mouth.   11/19/2017 at Unknown time  . Mouthwash Compounding Base (MOUTHWASH-AF  PO) Take 30 mLs by mouth 4 (four) times daily as needed (pain). musositis mouthwash-viscous lidocaine    unk  . mycophenolate (MYFORTIC) 180 MG EC tablet Take 720 mg 2 (two) times daily by mouth.   11/20/2017 at Unknown time  . omeprazole (PRILOSEC) 20 MG capsule Take 20 mg by mouth 2 (two) times daily before a meal.    11/20/2017 at Unknown time  . predniSONE (DELTASONE) 5 MG tablet Take 5 mg daily with breakfast by mouth.   11/20/2017 at Unknown time  . simvastatin (ZOCOR) 10 MG tablet Take 10 mg by mouth at bedtime.  0 11/19/2017 at Unknown time  . sodium bicarbonate 650 MG tablet Take 1,300 mg by mouth 2 (two) times daily.    11/20/2017 at Unknown time  . sulfamethoxazole-trimethoprim (BACTRIM,SEPTRA) 400-80 MG tablet Take 1 tablet 3 (three) times a week by mouth. Monday, Wednesday, and Friday   11/18/2017  . tacrolimus (PROGRAF) 1 MG capsule Take 4 mg 2 (two) times daily by mouth.   11/20/2017 at Unknown time  . traMADol (ULTRAM) 50 MG tablet Take 1 tablet (50 mg total) by mouth every 6 (six) hours as needed for moderate pain or severe pain. 30 tablet 0 unk    Patient Stressors: Health problems Marital or family conflict  Patient Strengths: Ability for insight Average or above average intelligence Capable of independent living General fund of knowledge  Treatment Modalities: Medication Management, Group therapy, Case management,  1 to 1 session with clinician, Psychoeducation, Recreational therapy.   Physician Treatment Plan for Primary Diagnosis: MDD (major depressive disorder), recurrent, severe, with psychosis ([place]) Long Term Goal(s): Improvement in symptoms so as ready for discharge  Short Term Goals: Ability to identify changes in lifestyle to reduce recurrence of condition will improve Ability to identify and develop effective coping behaviors will improve Ability to identify and develop effective coping  behaviors will improve  Medication Management: Evaluate patient's response, side effects, and tolerance of medication regimen.  Therapeutic Interventions: 1 to 1 sessions, Unit Group sessions and Medication administration.  Evaluation of Outcomes: Progressing  Physician Treatment Plan for Secondary Diagnosis: Principal Problem:   MDD (major depressive disorder), recurrent, severe, with psychosis ([place]) Active Problems:   PTSD (post-traumatic stress disorder)   Long Term Goal(s): Improvement in symptoms so as ready for discharge  Short Term Goals: Ability to identify changes in lifestyle to reduce recurrence of condition will improve Ability to identify and develop effective coping behaviors will improve Ability to identify and develop effective coping behaviors will improve  Medication Management: Evaluate patient's response, side effects, and tolerance of medication regimen.  Therapeutic Interventions: 1 to 1 sessions, Unit Group sessions and Medication administration.  Evaluation of Outcomes: Progressing   RN Treatment Plan for Primary Diagnosis: MDD (major depressive disorder), recurrent, severe, with psychosis ([place]) Long Term Goal(s): Knowledge of disease and therapeutic regimen to maintain health will improve  Short Term Goals: Ability to identify and develop effective coping behaviors will improve and Compliance with prescribed medications will improve  Medication Management: RN will administer medications as ordered by provider, will assess and evaluate patient's response and provide education to patient for prescribed medication. RN will report any adverse and/or side effects to prescribing provider.  Therapeutic Interventions: 1 on 1 counseling sessions, Psychoeducation, Medication administration, Evaluate responses to treatment, Monitor vital signs and CBGs as ordered, Perform/monitor CIWA, COWS, AIMS and Fall Risk screenings as ordered, Perform wound care treatments as  ordered.  Evaluation of Outcomes: Progressing   LCSW  Treatment Plan for Primary Diagnosis: MDD (major depressive disorder), recurrent, severe, with psychosis ([place]) Long Term Goal(s): Safe transition to appropriate next level of care at discharge, Engage patient in therapeutic group addressing interpersonal concerns.  Short Term Goals: Engage patient in aftercare planning with referrals and resources  Therapeutic Interventions: Assess for all discharge needs, 1 to 1 time with Social worker, Explore available resources and support systems, Assess for adequacy in community support network, Educate family and significant other(s) on suicide prevention, Complete Psychosocial Assessment, Interpersonal group therapy.  Evaluation of Outcomes: Met  Return home, follow up at [facility] in Treatment: Attending groups: Yes Participating in groups: Yes Taking medication as prescribed: Yes Toleration medication: Yes, no side effects reported at this time Family/Significant other contact made: No Patient understands diagnosis: Yes AEB asking for help with depression, SI Discussing patient identified problems/goals with staff: Yes Medical problems stabilized or resolved: Yes Denies suicidal/homicidal ideation: Yes Issues/concerns per patient self-inventory: None Other: N/A  New problem(s) identified: None identified at this time.   New Short Term/Long Term Goal(s): "I want to be noticed and not being treated like a nobody, and I want to learn how to put me first."  Dr reframe is "working on coping skills so you can set boundaries with others, and working on Armed forces logistics/support/administrative officer in difficult situations."   Discharge Plan or Barriers:   Reason for Continuation of Hospitalization:  Depression Hallucinations Medication stabilization   Estimated Length of Stay: 8/26  Attendees: Patient: Linda Ellison 11/23/2017  9:43 AM  Physician: Maris Berger, MD 11/23/2017  9:43 AM   Nursing: Elesa Massed, RN 11/23/2017  9:43 AM  RN Care Manager: Lars Pinks, RN 11/23/2017  9:43 AM  Social Worker: Ripley Fraise 11/23/2017  9:43 AM  Recreational Therapist: Winfield Cunas 11/23/2017  9:43 AM  Other: Norberto Sorenson 11/23/2017  9:43 AM  Other:  11/23/2017  9:43 AM    Scribe for Treatment Team:  Roque Lias LCSW 11/23/2017 9:43 AM

## 2017-11-23 NOTE — BHH Group Notes (Signed)
LCSW Group Therapy Note  11/23/2017 1:15pm    Type of Therapy and Topic:  Group Therapy:  Who Am I?  Self Esteem, Self-Actualization and Understanding Self    Participation Level:  Active  Description of Group:    In this group patients will be asked to explore values, beliefs, truths, and morals as they relate to personal self.  Patients will be guided to discuss their thoughts, feelings, and behaviors related to what they identify as important to their true self. Patients will process together how values, beliefs and truths are connected to specific choices patients make every day. Each patient will be challenged to identify changes that they are motivated to make in order to improve self-esteem and self-actualization. This group will be process-oriented, with patients participating in exploration of their own experiences, giving and receiving support, and processing challenge from other group members.   Therapeutic Goals: 1. Patient will identify false beliefs that currently interfere with their self-esteem.  2. Patient will identify feelings, thought process, and behaviors related to self and will become aware of the uniqueness of themselves and of others.  3. Patient will be able to identify and verbalize values, morals, and beliefs as they relate to self. 4. Patient will begin to learn how to build self-esteem/self-awareness by expressing what is important and unique to them personally.   Summary of Patient Progress   Stayed the entire time, engaged throughout. Rated her self esteem below 5.  Able to identify her strengths kind, caring, loving and good dog mother.  Juxtaposed with that is her goal of "making myself number one."  Led to a discussion about dealing with others about setting firm boundaries and the challenges with that.    Therapeutic Modalities:   Cognitive Behavioral Therapy Solution Focused Therapy Motivational Interviewing Brief Therapy   Trish Mage,  LCSW 11/23/2017 11:23 AM

## 2017-11-23 NOTE — Progress Notes (Signed)
Recreation Therapy Notes  Date: 8.21.19 Time: 1000 Location: 500 Hall Dayroom  Group Topic: Communication  Goal Area(s) Addresses:  Patient will effectively communicate with peers in group.  Patient will verbalize benefit of healthy communication. Patient will verbalize positive effect of healthy communication on post d/c goals.  Patient will identify communication techniques that made activity effective for group.   Behavioral Response: Engaged  Intervention: Paper, pencils, geometrical shapes  Activity: Geometrical Designs.  Patients were split into groups of 2.  One person was the speaker and the other the listener.  The speaker had to describe the picture they were given to their partner.  Their partner would then draw the picture that was being described.  The listener was no allowed to ask any questions during the activity.  Education: Communication, Discharge Planning  Education Outcome: Acknowledges understanding/In group clarification offered/Needs additional education.   Clinical Observations/Feedback:  Pt stated listening was a part of communication.  Pt also stated sign language was a form of communication. Pt stated as the listener, her partner didn't give clear instructions.  Pt stated as the listener "I described the best I could and my partner wasn't concentrating".  Pt expressed she didn't like to talk to people and was encouraged to right her feelings down when she starts to feel frustrated instead of shutting down and then revisit the issue later.    Victorino Sparrow, LRT/CTRS    Victorino Sparrow A 11/23/2017 11:38 AM

## 2017-11-23 NOTE — Progress Notes (Signed)
Nursing Progress Note: 7p-7a D: Pt currently presents with a depressed/anxious affect and behavior. Pt states "I am ready to get back to school and my daughter. They keep me motivated to be better." Interacting appropriately with the milieu. Pt reports good sleep during the previous night with current medication regimen. Pt did attend wrap-up group.  A: Pt provided with medications per providers orders. Pt's labs and vitals were monitored throughout the night. Pt supported emotionally and encouraged to express concerns and questions. Pt educated on medications.  R: Pt's safety ensured with 15 minute and environmental checks. Pt currently denies SI, HI, and AVH. Pt verbally contracts to seek staff if SI,HI, or AVH occurs and to consult with staff before acting on any harmful thoughts. Will continue to monitor.

## 2017-11-23 NOTE — Progress Notes (Signed)
Patient ID: Linda Ellison, female   DOB: 1976/02/01, 43 y.o.   MRN: [id number] DAR Note: Pt continue to endorse moderate anxiety and depression. Pt at this time denied SI/HI of AVH; "I am not hearing any voices at this time." Pt observed in the dayroom interacting with peers. Pt was med compliant.  Support, encouragement, and safe environment provided.  15-minute safety checks continue.

## 2017-11-24 DIAGNOSIS — T1491XA Suicide attempt, initial encounter: Secondary | ICD-10-CM

## 2017-11-24 DIAGNOSIS — E785 Hyperlipidemia, unspecified: Secondary | ICD-10-CM

## 2017-11-24 DIAGNOSIS — Z560 Unemployment, unspecified: Secondary | ICD-10-CM

## 2017-11-24 DIAGNOSIS — E119 Type 2 diabetes mellitus without complications: Secondary | ICD-10-CM

## 2017-11-24 DIAGNOSIS — T50902A Poisoning by unspecified drugs, medicaments and biological substances, intentional self-harm, initial encounter: Secondary | ICD-10-CM

## 2017-11-24 DIAGNOSIS — J449 Chronic obstructive pulmonary disease, unspecified: Secondary | ICD-10-CM

## 2017-11-24 DIAGNOSIS — K219 Gastro-esophageal reflux disease without esophagitis: Secondary | ICD-10-CM

## 2017-11-24 DIAGNOSIS — I10 Essential (primary) hypertension: Secondary | ICD-10-CM

## 2017-11-24 DIAGNOSIS — G4700 Insomnia, unspecified: Secondary | ICD-10-CM

## 2017-11-24 LAB — GLUCOSE, CAPILLARY
GLUCOSE-CAPILLARY: 211 mg/dL — AB (ref 70–99)
GLUCOSE-CAPILLARY: 126 mg/dL — AB (ref 70–99)
GLUCOSE-CAPILLARY: 255 mg/dL — AB (ref 70–99)
Glucose-Capillary: 238 mg/dL — ABNORMAL HIGH (ref 70–99)

## 2017-11-24 NOTE — [facility] Suicide Risk Assessment (Signed)
[place] INPATIENT:  Family/Significant Other Suicide Prevention Education  Suicide Prevention Education:  Education Completed; [name], fiance, [phone] has been identified by the patient as the family member/significant other with whom the patient will be residing, and identified as the person(s) who will aid the patient in the event of a mental health crisis (suicidal ideations/suicide attempt).  With written consent from the patient, the family member/significant other has been provided the following suicide prevention education, prior to the and/or following the discharge of the patient.  The suicide prevention education provided includes the following:  Suicide risk factors  Suicide prevention and interventions  National Suicide Hotline telephone number  [facility] assessment telephone number  [facility] Emergency Assistance [place] and/or Residential Mobile Crisis Unit telephone number  Request made of family/significant other to:  Remove weapons (e.g., guns, rifles, knives), all items previously/currently identified as safety concern.    Remove drugs/medications (over-the-counter, prescriptions, illicit drugs), all items previously/currently identified as a safety concern.  The family member/significant other verbalizes understanding of the suicide prevention education information provided.  The family member/significant other agrees to remove the items of safety concern listed above.  [place] 11/24/2017, 11:09 AM

## 2017-11-24 NOTE — Progress Notes (Signed)
Recreation Therapy Notes  Date: 8.22.19 Time: 1000 Location: 500 Hall Dayroom  Group Topic: Self-Esteem  Goal Area(s) Addresses:  Patient will successfully identify positive attributes about themselves.  Patient will successfully identify benefit of improved self-esteem.   Behavioral Response: Engaged  Intervention: Magazines, scissors, glue sticks, Architect paper, music  Activity: Brochure About Me.  Patients were to create a brochure that described their uniqueness and any positive characteristics about them.  LRT played music in the background as patients worked on their brochures.   Education:  Self-Esteem, Dentist.   Education Outcome: Acknowledges education/In group clarification offered/Needs additional education  Clinical Observations/Feedback: Pt stated self-esteem is influenced by "other people and actresses getting plastic surgery to look good".  Pt also expressed most people tend to focus on the negative instead of the positive.  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  Pt also stated she tells herself she looks good in the mirror every morning.   Victorino Sparrow, LRT/CTRS     Victorino Sparrow A 11/24/2017 12:29 PM

## 2017-11-24 NOTE — Progress Notes (Signed)
Adult Psychoeducational Group Note  Date:  11/24/2017 Time:  9:08 PM  Group Topic/Focus:  Wrap-Up Group:   The focus of this group is to help patients review their daily goal of treatment and discuss progress on daily workbooks.  Participation Level:  Active  Participation Quality:  Appropriate  Affect:  Appropriate  Cognitive:  Appropriate  Insight: Appropriate  Engagement in Group:  Engaged  Modes of Intervention:  Discussion  Additional Comments:  The patient expressed that she rates today a 10.The patient also said that she attended groups and is preparing for discharge.  Linda Ellison 11/24/2017, 9:08 PM

## 2017-11-24 NOTE — Progress Notes (Signed)
Nursing note 7p-7a  Pt observed interacting with peers on unit this shift. Displayed a bright affect and pleasant mood upon interaction with this Probation officer. Pt denies pain ,denies SI/HI, and also denies any audio or visual hallucinations at this time. CBG 238 see MAR for sliding scale. Pt is able to verbally contract for safety with this RN. Goal: "to discharge" pt was able to list multiple coping skills for her to use when she is angry. Pt stated that her visit went well this shift. Pt is now resting in bed with eyes closed, with no signs or symptoms of pain or distress noted. Pt continues to remain safe on the unit and is observed by rounding every 15 min. RN will continue to monitor.

## 2017-11-24 NOTE — Plan of Care (Signed)
  Problem: Education: Goal: Knowledge of [facility] Education information/materials will improve Outcome: Progressing Goal: Emotional status will improve Outcome: Progressing Goal: Mental status will improve Outcome: Progressing Goal: Verbalization of understanding the information provided will improve Outcome: Progressing   Problem: Activity: Goal: Interest or engagement in activities will improve Outcome: Progressing Goal: Sleeping patterns will improve Outcome: Progressing   Problem: Coping: Goal: Ability to verbalize frustrations and anger appropriately will improve Outcome: Progressing Goal: Ability to demonstrate self-control will improve Outcome: Progressing   Problem: Health Behavior/Discharge Planning: Goal: Identification of resources available to assist in meeting health care needs will improve Outcome: Progressing Goal: Compliance with treatment plan for underlying cause of condition will improve Outcome: Progressing   Problem: Physical Regulation: Goal: Ability to maintain clinical measurements within normal limits will improve Outcome: Progressing   Problem: Safety: Goal: Periods of time without injury will increase Outcome: Progressing   Problem: Education: Goal: Ability to make informed decisions regarding treatment will improve Outcome: Progressing   Problem: Coping: Goal: Coping ability will improve Outcome: Progressing   Problem: Health Behavior/Discharge Planning: Goal: Identification of resources available to assist in meeting health care needs will improve Outcome: Progressing   Problem: Medication: Goal: Compliance with prescribed medication regimen will improve Outcome: Progressing   Problem: Self-Concept: Goal: Ability to disclose and discuss suicidal ideas will improve Outcome: Progressing Goal: Will verbalize positive feelings about self Outcome: Progressing   Problem: Education: Goal: Utilization of techniques to improve  thought processes will improve Outcome: Progressing Goal: Knowledge of the prescribed therapeutic regimen will improve Outcome: Progressing   Problem: Activity: Goal: Interest or engagement in leisure activities will improve Outcome: Progressing Goal: Imbalance in normal sleep/wake cycle will improve Outcome: Progressing   Problem: Coping: Goal: Coping ability will improve Outcome: Progressing Goal: Will verbalize feelings Outcome: Progressing   Problem: Health Behavior/Discharge Planning: Goal: Ability to make decisions will improve Outcome: Progressing Goal: Compliance with therapeutic regimen will improve Outcome: Progressing   Problem: Role Relationship: Goal: Will demonstrate positive changes in social behaviors and relationships Outcome: Progressing   Problem: Safety: Goal: Ability to disclose and discuss suicidal ideas will improve Outcome: Progressing Goal: Ability to identify and utilize support systems that promote safety will improve Outcome: Progressing   Problem: Self-Concept: Goal: Will verbalize positive feelings about self Outcome: Progressing Goal: Level of anxiety will decrease Outcome: Progressing   Problem: Education: Goal: Knowledge of [facility] Education information will improve Description Including pain rating scale, medication(s)/side effects and non-pharmacologic comfort measures Outcome: Progressing   Problem: Health Behavior/Discharge Planning: Goal: Ability to manage health-related needs will improve Outcome: Progressing   Problem: Coping: Goal: Level of anxiety will decrease Outcome: Progressing   Problem: Safety: Goal: Ability to remain free from injury will improve Outcome: Progressing

## 2017-11-24 NOTE — [facility] Group Notes (Signed)
LCSW Group Therapy Note  11/24/2017 1:15pm  Type of Therapy/Topic:  Group Therapy:  Balance in Life  Participation Level:  Active  Description of Group:    This group will address the concept of balance and how it feels and looks when one is unbalanced. Patients will be encouraged to process areas in their lives that are out of balance and identify reasons for remaining unbalanced. Facilitators will guide patients in utilizing problem-solving interventions to address and correct the stressor making their life unbalanced. Understanding and applying boundaries will be explored and addressed for obtaining and maintaining a balanced life. Patients will be encouraged to explore ways to assertively make their unbalanced needs known to significant others in their lives, using other group members and facilitator for support and feedback.  Therapeutic Goals: 1. Patient will identify two or more emotions or situations they have that consume much of in their lives. 2. Patient will identify signs/triggers that life has become out of balance:  3. Patient will identify two ways to set boundaries in order to achieve balance in their lives:  4. Patient will demonstrate ability to communicate their needs through discussion and/or role plays  Summary of Patient Progress:  Stayed the entire time, engaged throughout.  Stated she feels "mostly" emotionally balanced today.  "I'm a little nervous about going home, but I feel so much better than when I came in.  Able to identify where she feels anger in her body, and able to identify some coping skills and an exit plan.    Therapeutic Modalities:   Cognitive Behavioral Therapy Solution-Focused Therapy Assertiveness Training  Trish Mage, [place] 11/24/2017 4:24 PM

## 2017-11-24 NOTE — Plan of Care (Signed)
  Problem: Activity: Goal: Interest or engagement in activities will improve Outcome: Progressing   Problem: Safety: Goal: Periods of time without injury will increase Outcome: Progressing   Problem: Medication: Goal: Compliance with prescribed medication regimen will improve Outcome: Progressing  DAR NOTE: Patient presents with anxious affect and depressed mood.  Denies suicidal thoughts, pain, auditory and visual hallucinations.  Described energy level as normal and concentration as good.  Rates depression at 3, hopelessness at 4, and anxiety at 2.  Maintained on routine safety checks.  Medications given as prescribed.  Support and encouragement offered as needed.  Attended group and participated.  States goal for today is work on my anger."  Patient observed socializing with peers in the dayroom.  Offered no complaint.
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
## 2017-11-25 LAB — GLUCOSE, CAPILLARY
Glucose-Capillary: 190 mg/dL — ABNORMAL HIGH (ref 70–99)
Glucose-Capillary: 175 mg/dL — ABNORMAL HIGH (ref 70–99)

## 2017-11-25 MED ORDER — LABETALOL HCL 200 MG PO TABS: 400.0000 mg | ORAL_TABLET | Freq: Three times a day (TID) | ORAL | Status: DC

## 2017-11-25 MED ORDER — INSULIN ASPART 100 UNIT/ML ~~LOC~~ SOLN: 10.0000 [IU] | Freq: Every day | SUBCUTANEOUS | 11 refills | Status: DC

## 2017-11-25 MED ORDER — INSULIN ASPART 100 UNIT/ML ~~LOC~~ SOLN: 14.0000 [IU] | Freq: Two times a day (BID) | SUBCUTANEOUS | 11 refills | Status: DC

## 2017-11-25 MED ORDER — ZOLPIDEM TARTRATE 5 MG PO TABS: 5.0000 mg | ORAL_TABLET | Freq: Every evening | ORAL | 0 refills | Status: DC | PRN

## 2017-11-25 MED ORDER — SIMVASTATIN 10 MG PO TABS: 10.0000 mg | ORAL_TABLET | Freq: Every day | ORAL | 0 refills | Status: DC

## 2017-11-25 MED ORDER — MAGNESIUM OXIDE 400 (241.3 MG) MG PO TABS: 400.0000 mg | ORAL_TABLET | Freq: Every day | ORAL | Status: DC

## 2017-11-25 MED ORDER — FUROSEMIDE 20 MG PO TABS: 20.0000 mg | ORAL_TABLET | Freq: Every day | ORAL | 0 refills | Status: DC

## 2017-11-25 MED ORDER — ARIPIPRAZOLE 10 MG PO TABS: 10.0000 mg | ORAL_TABLET | Freq: Every day | ORAL | 0 refills | Status: DC

## 2017-11-25 MED ORDER — MYCOPHENOLATE SODIUM 360 MG PO TBEC: 720.0000 mg | DELAYED_RELEASE_TABLET | Freq: Two times a day (BID) | ORAL | Status: DC

## 2017-11-25 MED ORDER — PANTOPRAZOLE SODIUM 40 MG PO TBEC: 40.0000 mg | DELAYED_RELEASE_TABLET | Freq: Every day | ORAL | 0 refills | Status: DC

## 2017-11-25 MED ORDER — ALBUTEROL SULFATE HFA 108 (90 BASE) MCG/ACT IN AERS: 2.0000 | INHALATION_SPRAY | Freq: Four times a day (QID) | RESPIRATORY_TRACT | Status: DC | PRN

## 2017-11-25 MED ORDER — INSULIN GLARGINE 100 UNIT/ML ~~LOC~~ SOLN: 26.0000 [IU] | Freq: Every day | SUBCUTANEOUS | 11 refills | Status: DC

## 2017-11-25 MED ORDER — SULFAMETHOXAZOLE-TRIMETHOPRIM 400-80 MG PO TABS: 1.0000 | ORAL_TABLET | ORAL | Status: DC

## 2017-11-25 MED ORDER — BUPROPION HCL ER (XL) 150 MG PO TB24: 150.0000 mg | ORAL_TABLET | Freq: Every day | ORAL | 0 refills | Status: DC

## 2017-11-25 NOTE — Progress Notes (Signed)
Recreation Therapy Notes  INPATIENT RECREATION TR PLAN  Patient Details Name: Linda Ellison MRN: [id number] DOB: 1976-03-13 Today's Date: 11/25/2017  Rec Therapy Plan Is patient appropriate for Therapeutic Recreation?: Yes Treatment times per week: about 3 days  Estimated Length of Stay: 5-7 days TR Treatment/Interventions: Group participation (Comment)  Discharge Criteria Pt will be discharged from therapy if:: Discharged Treatment plan/goals/alternatives discussed and agreed upon by:: Patient/family  Discharge Summary Short term goals set: See patient care plan Short term goals met: Complete Progress toward goals comments: Groups attended Which groups?: Self-esteem, Wellness, AAA/T, Communication Reason goals not met: None Therapeutic equipment acquired: N/A Reason patient discharged from therapy: Discharge from hospital Pt/family agrees with progress & goals achieved: Yes Date patient discharged from therapy: 11/25/17   Linda Ellison, LRT/CTRS  Ria Comment, Tiffancy Moger A 11/25/2017, 12:25 PM

## 2017-11-25 NOTE — Progress Notes (Signed)
  [facility] Discharge Plan :  Will you be returning to the same living situation after discharge:  Yes,  home At discharge, do you have transportation home?: Yes,  family Do you have the ability to pay for your medications: Yes,  MCR  Release of information consent forms completed and in the chart;  Patient's signature needed at discharge.  Patient to Follow up at: [place] Follow up on 12/06/2017.   Specialty:  Behavioral Health Why:  Tuesday at noon with Remo Lipps.  Then, September 13, Friday, at 2:00 with Josepha Pigg information: [address] [place] [id number] [phone]           Next level of care provider has access to [place] and Suicide Prevention discussed: Yes,  yes  Have you used any form of tobacco in the last 30 days? (Cigarettes, Smokeless Tobacco, Cigars, and/or Pipes): No  Has patient been referred to the Quitline?: N/A patient is not a smoker  Patient has been referred for addiction treatment: [place], LCSW 11/25/2017, 9:23 AM
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
## 2017-11-25 NOTE — Progress Notes (Signed)
Patient ID: Linda Ellison, female   DOB: Oct 10, 1975, 42 y.o.   MRN: [id number] PER STATE REGULATIONS 482.30  THIS CHART WAS REVIEWED FOR MEDICAL NECESSITY WITH RESPECT TO THE PATIENT'S ADMISSION/DURATION OF STAY.  NEXT REVIEW DATE:11/29/17 Roma Schanz, RN, BSN CASE MANAGER

## 2017-11-25 NOTE — Plan of Care (Signed)
Patient was able to identify three positive traits about herself at the conclusion of recreation therapy group sessions.   Victorino Sparrow, LRT/CTRS

## 2017-11-25 NOTE — Progress Notes (Signed)
Patient discharged to lobby. Patient was stable and appreciative at that time. All papers and prescriptions were given and valuables returned. Verbal understanding expressed. Denies SI/HI and A/VH. Patient given opportunity to express concerns and ask questions.  
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
## 2018-01-24 ENCOUNTER — Encounter (HOSPITAL_COMMUNITY): Payer: Self-pay

## 2018-01-24 ENCOUNTER — Emergency Department (HOSPITAL_COMMUNITY): Payer: Medicare Other

## 2018-01-24 ENCOUNTER — Emergency Department (HOSPITAL_COMMUNITY)
Admission: EM | Admit: 2018-01-24 | Discharge: 2018-01-25 | Disposition: A | Discharge status: Home / Self Care | Payer: Medicare Other | Attending: Emergency Medicine | Admitting: Emergency Medicine

## 2018-01-24 DIAGNOSIS — M546 Pain in thoracic spine: Secondary | ICD-10-CM | POA: Insufficient documentation

## 2018-01-24 DIAGNOSIS — I120 Hypertensive chronic kidney disease with stage 5 chronic kidney disease or end stage renal disease: Secondary | ICD-10-CM | POA: Insufficient documentation

## 2018-01-24 DIAGNOSIS — I2510 Atherosclerotic heart disease of native coronary artery without angina pectoris: Secondary | ICD-10-CM | POA: Diagnosis not present

## 2018-01-24 DIAGNOSIS — J45909 Unspecified asthma, uncomplicated: Secondary | ICD-10-CM | POA: Insufficient documentation

## 2018-01-24 DIAGNOSIS — Z794 Long term (current) use of insulin: Secondary | ICD-10-CM | POA: Insufficient documentation

## 2018-01-24 DIAGNOSIS — Z992 Dependence on renal dialysis: Secondary | ICD-10-CM | POA: Insufficient documentation

## 2018-01-24 DIAGNOSIS — N186 End stage renal disease: Secondary | ICD-10-CM | POA: Insufficient documentation

## 2018-01-24 DIAGNOSIS — R079 Chest pain, unspecified: Secondary | ICD-10-CM | POA: Diagnosis not present

## 2018-01-24 DIAGNOSIS — Z79899 Other long term (current) drug therapy: Secondary | ICD-10-CM | POA: Diagnosis not present

## 2018-01-24 DIAGNOSIS — E1122 Type 2 diabetes mellitus with diabetic chronic kidney disease: Secondary | ICD-10-CM | POA: Insufficient documentation

## 2018-01-24 LAB — POCT I-STAT TROPONIN I: Troponin i, poc: 0 ng/mL (ref 0.00–0.08)

## 2018-01-24 LAB — BASIC METABOLIC PANEL
Anion gap: 8 (ref 5–15)
BUN: 20 mg/dL (ref 6–20)
CALCIUM: 9.2 mg/dL (ref 8.9–10.3)
CHLORIDE: 107 mmol/L (ref 98–111)
CO2: 22 mmol/L (ref 22–32)
CREATININE: 1.29 mg/dL — AB (ref 0.44–1.00)
GFR calc Af Amer: 58 mL/min — ABNORMAL LOW (ref >60)
GFR calc non Af Amer: 50 mL/min — ABNORMAL LOW (ref >60)
GLUCOSE: 304 mg/dL — AB (ref 70–99)
Potassium: 4.5 mmol/L (ref 3.5–5.1)
Sodium: 137 mmol/L (ref 135–145)

## 2018-01-24 LAB — CBC
HCT: 39.2 % (ref 36.0–46.0)
Hemoglobin: 11.4 g/dL — ABNORMAL LOW (ref 12.0–15.0)
MCH: 25.2 pg — AB (ref 26.0–34.0)
MCHC: 29.1 g/dL — ABNORMAL LOW (ref 30.0–36.0)
MCV: 86.5 fL (ref 80.0–100.0)
PLATELETS: 286 10*3/uL (ref 150–400)
RBC: 4.53 MIL/uL (ref 3.87–5.11)
RDW: 13.1 % (ref 11.5–15.5)
WBC: 9.5 10*3/uL (ref 4.0–10.5)
nRBC: 0 % (ref 0.0–0.2)

## 2018-01-24 LAB — I-STAT BETA HCG BLOOD, ED (NOT ORDERABLE): I-stat hCG, quantitative: <5 m[IU]/mL (ref <5)
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
## 2018-01-24 NOTE — ED Notes (Signed)
Pt ambulated to the bathroom with minimal assistance and is providing urine sample

## 2018-01-24 NOTE — ED Provider Notes (Signed)
[facility] DEPT Provider Note   CSN: [id number] Arrival date & time: 01/24/18  2049     History   Chief Complaint No chief complaint on file.   HPI Linda Ellison is a 42 y.o. female.  Patient presents to the emergency department for evaluation of chest pain.  Patient reports that her chest began hurting this morning when she was in class.  Over the course of the day the pain persisted.  She has not had any pain relief today.  She reports that when the pain came on she felt dizzy, got sweaty and then felt like she was going to pass out.  This has not reoccurred but the pain has persisted.  Patient reports that when the pain was severe at onset she had pain between her shoulder blades as well.  Back pain has improved but chest pain continues.  Patient reports that she has no history of heart disease, but is concerned because her mother died of a heart attack at age 103.     Past Medical History:  Diagnosis Date  . Allergy   . Anemia associated with chronic renal failure   . Anxiety   . Asthma   . Atypical chest pain    long-standing -- normal cardio cath 06-27-2012 and normal nuclear stress test 06-25-2016  . AV (arteriovenous fistula) (HCC)    for dialysis-- currently located left radiocephalic  . CAD (coronary artery disease) cardiologist-- dr Clydie Braun ([facility] in [place])   a. False positive stress echo 06/2012 at [facility] - cath with no obstructive CAD at [facility] (mild luminal irregularities in LAD, moderate diffuse disease in distal RCA).  . Depression   . Elevated lipids   . ESRD on hemodialysis [facility] NEPHROLOGIST-  DR MATTINGLY   started dialysis 01/29/11: [place] on MWF  . Gastroparesis   . GERD (gastroesophageal reflux disease)   . History of pneumonia    HCAP 04/ 2017  . Hyperlipidemia   . Hyperthyroidism   . Menorrhagia   . Other secondary hypertension    associated to diabetes  -- followed by cardiologist ( dr Clydie Braun)    . Peripheral neuropathy   . PONV (postoperative nausea and vomiting)   . Pre-transplant evaluation for kidney transplant    [facility]  . Secondary hyperparathyroidism of renal origin ([place])   . Type 2 diabetes mellitus, with long-term current use of insulin ([place])    dx 1985    Patient Active Problem List   Diagnosis Date Noted  . PTSD (post-traumatic stress disorder) 11/22/2017  . MDD (major depressive disorder), recurrent, severe, with psychosis ([place]) 11/21/2017  . Kidney transplant recipient 06/16/2017  . Steal syndrome of dialysis vascular access ([place]) 06/16/2017  . PNA (pneumonia) 09/08/2015  . Chest pain 07/12/2015  . Personal history of adenomatouscolonic polyps 10/26/2011  . Diabetes mellitus with nephropathy ([place]) 08/11/2011  . Diabetic nephropathy ([place]) 01/26/2011  . Obesity, unspecified 08/08/2008  . GERD 08/08/2008  . Essential hypertension 04/06/1999    Past Surgical History:  Procedure Laterality Date  . AV FISTULA PLACEMENT  08/2010   Left radiocephalic AVF  . AV FISTULA PLACEMENT Right 05/07/2014   Procedure: ARTERIOVENOUS (AV) FISTULA CREATION;  Surgeon: Elam Dutch, MD;  Location: [facility] OR;  Service: Vascular;  Laterality: Right;  . AV FISTULA PLACEMENT Right 07/02/2014   Procedure: INSERTION OF ARTERIOVENOUS (AV) GORE-TEX GRAFT ARM;  Surgeon: Elam Dutch, MD;  Location: [facility];  Service: Vascular;  Laterality: Right;  . CARDIOVASCULAR STRESS TEST  06/25/2016   [place]   normal nuclear perfusion study w/ no ischemia/  normal LV function and wall motion , ef 51%  . CESAREAN SECTION  03/22/2001   w/ Bilateral Tubal Ligation  . COLONOSCOPY    . COLONOSCOPY WITH ESOPHAGOGASTRODUODENOSCOPY (EGD)  10/19/2011  . DILATION AND CURETTAGE OF UTERUS  05/12/2000   w/ suction for missed ab  . DOBUTAMINE STRESS ECHO  06/04/2016    [facility]   normal stress echo w/ no chest pain or ischemia/  normal LV  function and wall motion , stress ef 60-65%  . ESOPHAGOGASTRODUODENOSCOPY  10/19/2011   Dr. Silvano Rusk  . FEMUR IM NAIL Left child   removed  . FOOT SURGERY Left 2014 approx.  Marland Kitchen HYSTEROSCOPY WITH NOVASURE N/A 08/19/2016   Procedure: HYSTEROSCOPY WITH NOVASURE;  Surgeon: Paula Compton, MD;  Location: [facility];  Service: Gynecology;  Laterality: N/A;  . KIDNEY TRANSPLANT  09/18/2016  . LAPAROSCOPIC CHOLECYSTECTOMY  1994  . LEFT HEART CATHETERIZATION WITH CORONARY ANGIOGRAM N/A 06/27/2012   Procedure: LEFT HEART CATHETERIZATION WITH CORONARY ANGIOGRAM;  Surgeon: Larey Dresser, MD;  Location: [facility] CATH LAB;  Service: Cardiovascular;  Laterality: N/A; No ostructive CAD, normal LVF, ef 55-60% (mild LAD luminal irregularities, moderate dRCA diffuse disease)  . LIGATION GORETEX FISTULA  01/04/11   Left AVF  . LIGATION OF ARTERIOVENOUS  FISTULA Left 08/21/2014   Procedure: LIGATION OF ARTERIOVENOUS  FISTULA;  Surgeon: Algernon Huxley, MD;  Location: [facility] ORS;  Service: Vascular;  Laterality: Left;  . LIGATION OF ARTERIOVENOUS  FISTULA Left 06/29/2017   Procedure: LIGATION OF ARTERIOVENOUS  FISTULA ( RADIOCEPHALIC );  Surgeon: Algernon Huxley, MD;  Location: [facility] ORS;  Service: Vascular;  Laterality: Left;  . PERIPHERAL VASCULAR CATHETERIZATION Left 08/08/2014   Procedure: Upper Extremity Angiography;  Surgeon: Algernon Huxley, MD;  Location: [facility] CV LAB;  Service: Cardiovascular;  Laterality: Left;  . PERIPHERAL VASCULAR CATHETERIZATION Left 08/08/2014   Procedure: Upper Extremity Intervention;  Surgeon: Algernon Huxley, MD;  Location: [facility] CV LAB;  Service: Cardiovascular;  Laterality: Left;  . PERIPHERAL VASCULAR CATHETERIZATION Left 03/08/2016   Procedure: A/V Fistulagram;  Surgeon: Algernon Huxley, MD;  Location: [facility] CV LAB;  Service: Cardiovascular;  Laterality: Left;  . POLYPECTOMY    . THROMBECTOMY AND REVISION OF ARTERIOVENTOUS (AV) GORETEX  GRAFT Right 07/16/2014    Procedure: THROMBECTOMY  Right  arm  ARTERIOVENOUS  GORETEX  GRAFT;  Surgeon: Elam Dutch, MD;  Location: [facility];  Service: Vascular;  Laterality: Right;  . TRANSTHORACIC ECHOCARDIOGRAM  06/04/2016   mild concentric LVH, ef 55-60%,  mild TR,  borderline LAE,  trivial MR and PR     OB History    Gravida  3   Para  1   Term      Preterm  1   AB  1   Living  2     SAB  1   TAB      Ectopic      Multiple      Live Births  2            Home Medications    Prior to Admission medications   Medication Sig Start Date End Date Taking? Authorizing Provider  amitriptyline (ELAVIL) 10 MG tablet Take 10 mg by mouth at bedtime.   Yes [provider]  amLODipine (NORVASC) 5 MG tablet Take  10 mg by mouth daily.   Yes [provider]  buPROPion (WELLBUTRIN XL) 150 MG 24 hr tablet Take 1 tablet (150 mg total) by mouth daily after breakfast. For depression 11/26/17  Yes Nwoko, Herbert Pun I, NP  cetirizine (ZYRTEC) 10 MG tablet Take 10 mg by mouth 2 (two) times daily.   Yes [provider]  citalopram (CELEXA) 40 MG tablet Take 40 mg by mouth daily.   Yes [provider]  cloNIDine (CATAPRES) 0.3 MG tablet Take 0.3 mg by mouth 3 (three) times daily.    Yes [provider]  furosemide (LASIX) 20 MG tablet Take 1 tablet (20 mg total) by mouth daily. For swellings Patient taking differently: Take 40 mg by mouth daily.  11/26/17  Yes Lindell Spar I, NP  gabapentin (NEURONTIN) 300 MG capsule Take 300 mg by mouth at bedtime as needed. pain   Yes [provider]  insulin aspart (NOVOLOG) 100 UNIT/ML injection Inject 10 Units into the skin daily with breakfast. For diabetes management Patient taking differently: Inject 6-11 Units into the skin See admin instructions. Base dose is 6. BS greater than 130 use sliding scale. 150-200 = 7 units; 201-250=8 units; 251-300=9 units; 301-350=10 units; 351-400=11 units and call the doctor 11/26/17  Yes Lindell Spar I, NP  Insulin Human (INSULIN PUMP) SOLN Inject 1 each into the skin See admin instructions.   Yes [provider]  labetalol (NORMODYNE) 200 MG tablet Take 2 tablets (400 mg total) by mouth 3 (three) times daily. For high blood pressure 11/25/17  Yes Nwoko, Agnes I, NP  magnesium oxide (MAG-OX) 400 (241.3 Mg) MG tablet Take 1 tablet (400 mg total) by mouth daily at 12 noon. For low Magnesium Patient taking differently: Take 400 mg by mouth daily at 12 noon.  11/25/17  Yes Lindell Spar I, NP  montelukast (SINGULAIR) 10 MG tablet Take 10 mg by mouth at bedtime.   Yes [provider]  mycophenolate (MYFORTIC) 360 MG TBEC EC tablet Take 2 tablets (720 mg total) by mouth 2 (two) times daily. For renal transplant 11/25/17  Yes Lindell Spar I, NP  omeprazole (PRILOSEC) 20 MG capsule Take 20 mg by mouth 2 (two) times daily before a meal.   Yes [provider]  predniSONE (DELTASONE) 5 MG tablet Take 5 mg by mouth daily with breakfast.   Yes [provider]  simvastatin (ZOCOR) 10 MG tablet Take 1 tablet (10 mg total) by mouth at bedtime. For high cholesterol 11/25/17  Yes Nwoko, Herbert Pun I, NP  sodium bicarbonate 650 MG tablet Take 1,300 mg by mouth 2 (two) times daily.   Yes [provider]  sulfamethoxazole-trimethoprim (BACTRIM,SEPTRA) 400-80 MG tablet Take 1 tablet by mouth 3 (three) times a week. For infection Patient taking differently: Take 1 tablet by mouth every Monday, Wednesday, and Friday. For infection 11/28/17  Yes Nwoko, Herbert Pun I, NP  tacrolimus (PROGRAF) 1 MG capsule Take 4 mg by mouth 2 (two) times daily.    Yes [provider]  zolpidem (AMBIEN) 5 MG tablet Take 1 tablet (5 mg total) by mouth at bedtime as needed for sleep. 11/25/17  Yes Lindell Spar I, NP  albuterol (PROVENTIL HFA;VENTOLIN HFA) 108 (90 Base) MCG/ACT inhaler Inhale 2 puffs into the lungs every 6 (six) hours as needed for wheezing or shortness of breath. 11/25/17   Lindell Spar I, NP  ARIPiprazole (ABILIFY) 10 MG tablet Take 1 tablet (10 mg total) by mouth daily. For mood control  Patient not taking: Reported on 01/25/2018 11/26/17   Lindell Spar I, NP  insulin aspart (NOVOLOG) 100 UNIT/ML injection Inject 14 Units into the skin 2 (two) times daily before lunch and supper. For diabetes management Patient not taking: Reported on 01/25/2018 11/25/17   Lindell Spar I, NP  insulin glargine (LANTUS) 100 UNIT/ML injection Inject 0.26 mLs (26 Units total) into the skin at bedtime. For diabetes management Patient not taking: Reported on 01/25/2018 11/25/17   Lindell Spar I, NP  pantoprazole (PROTONIX) 40 MG tablet Take 1 tablet (40 mg total) by mouth daily. For acid reflux Patient not taking: Reported on 01/25/2018 11/26/17   Encarnacion Slates, NP    Family History Family History  Problem Relation Age of Onset  . Heart disease Mother        heart attack at 79 y/o-infected valve  . Kidney disease Mother        was a dialysis patient  . Heart disease Brother   . Kidney disease Maternal Grandmother        pre-dialysis  . Diabetes Father   . Hypertension Father   . Colon cancer Paternal Grandmother   . Colon polyps Paternal Grandmother   . Esophageal cancer Paternal Grandmother   . Rectal cancer Paternal Grandmother   . Stomach cancer Paternal Grandmother   . Kidney failure Paternal Uncle   . Kidney failure Paternal Aunt   . Arthritis Brother   . Diabetes Maternal Aunt   . Hypertension Maternal Aunt   . Diabetes Paternal Aunt   . Heart disease Paternal Aunt   . Hypertension Paternal Aunt     Social History Social History   Tobacco Use  . Smoking status: Never Smoker  . Smokeless tobacco: Never Used  Substance Use Topics  . Alcohol use: No    Alcohol/week: 0.0 standard drinks  . Drug use: No     Allergies   Ciprofloxacin; Fluocinolone; Hydromorphone; Pineapple; Strawberry extract; Phenytoin sodium extended; Tylenol [acetaminophen]; Adhesive [tape];  Chlorhexidine gluconate; Clindamycin; Oxycodone; and Shrimp [shellfish allergy]   Review of Systems Review of Systems  Constitutional: Positive for diaphoresis.  Cardiovascular: Positive for chest pain.  Neurological: Positive for dizziness.  All other systems reviewed and are negative.    Physical Exam Updated Vital Signs BP (!) 144/94   Pulse 87   Temp 97.8 F (36.6 C) (Oral)   Resp 15   SpO2 98%   Physical Exam  Constitutional: She is oriented to person, place, and time. She appears well-developed and well-nourished. No distress.  HENT:  Head: Normocephalic and atraumatic.  Right Ear: Hearing normal.  Left Ear: Hearing normal.  Nose: Nose normal.  Mouth/Throat: Oropharynx is clear and moist and mucous membranes are normal.  Eyes: Pupils are equal, round, and reactive to light. Conjunctivae and EOM are normal.  Neck: Normal range of motion. Neck supple.  Cardiovascular: Regular rhythm, S1 normal and S2 normal. Exam reveals no gallop and no friction rub.  No murmur heard. Pulmonary/Chest: Effort normal and breath sounds normal. No respiratory distress. She exhibits no tenderness.  Abdominal: Soft. Normal appearance and bowel sounds are normal. There is no hepatosplenomegaly. There is no tenderness. There is no rebound, no guarding, no tenderness at McBurney's point and negative Murphy's sign. No hernia.  Musculoskeletal: Normal range of motion.  Neurological: She is alert and oriented to person, place, and time. She has normal strength. No cranial nerve deficit or sensory deficit. Coordination normal. GCS eye subscore is 4. GCS verbal subscore is  5. GCS motor subscore is 6.  Skin: Skin is warm, dry and intact. No rash noted. No cyanosis.  Psychiatric: She has a normal mood and affect. Her speech is normal and behavior is normal. Thought content normal.  Nursing note and vitals reviewed.    ED Treatments / Results  Labs (all labs ordered are listed, but only abnormal  results are displayed) Labs Reviewed  BASIC METABOLIC PANEL - Abnormal; Notable for the following components:      Result Value   Glucose, Bld 304 (*)    Creatinine, Ser 1.29 (*)    GFR calc non Af Amer 50 (*)    GFR calc Af Amer 58 (*)    All other components within normal limits  CBC - Abnormal; Notable for the following components:   Hemoglobin 11.4 (*)    MCH 25.2 (*)    MCHC 29.1 (*)    All other components within normal limits  D-DIMER, QUANTITATIVE (NOT AT [facility]) - Abnormal; Notable for the following components:   D-Dimer, Quant 1.02 (*)    All other components within normal limits  I-STAT TROPONIN, ED  I-STAT BETA HCG BLOOD, ED ([facility], [facility], [facility] ONLY)  POCT I-STAT TROPONIN I  I-STAT BETA HCG BLOOD, ED (NOT ORDERABLE)  POCT I-STAT TROPONIN I    EKG None   ED ECG REPORT   Date: 01/25/2018  Rate: 98  Rhythm: normal sinus rhythm  QRS Axis: right  Intervals: normal  ST/T Wave abnormalities: normal  Conduction Disutrbances:none  Narrative Interpretation:   Old EKG Reviewed: unchanged  I have personally reviewed the EKG tracing and agree with the computerized printout as noted.   Radiology Dg Chest 2 View  Result Date: 01/24/2018 CLINICAL DATA:  42 y/o  F; chest pain. EXAM: CHEST - 2 VIEW COMPARISON:  09/08/2015 chest radiograph FINDINGS: Stable heart size and mediastinal contours are within normal limits. Both lungs are clear. The visualized skeletal structures are unremarkable. Right upper quadrant surgical clips, presumably cholecystectomy. IMPRESSION: No acute pulmonary process identified. Electronically Signed   By: Kristine Garbe M.D.   On: 01/24/2018 21:40    Procedures Procedures (including critical care time)  Medications Ordered in ED Medications  labetalol (NORMODYNE) tablet 400 mg (400 mg Oral Given 01/25/18 0037)  labetalol (NORMODYNE,TRANDATE) injection 10 mg (10 mg Intravenous Given 01/25/18 0222)  morphine 4 MG/ML injection 4 mg (4 mg  Intravenous Given 01/25/18 0228)  ondansetron (ZOFRAN) injection 4 mg (4 mg Intravenous Given 01/25/18 0226)  diphenhydrAMINE (BENADRYL) injection 25 mg (25 mg Intravenous Given 01/25/18 0224)     Initial Impression / Assessment and Plan / ED Course  I have reviewed the triage vital signs and the nursing notes.  Pertinent labs & imaging results that were available during my care of the patient were reviewed by me and considered in my medical decision making (see chart for details).     Patient presents to the emergency department for evaluation of back pain and chest pain.  Symptoms began earlier today, have been present for most of the day.  EKG is unremarkable at arrival.  She has had troponin negative x2 during the evaluation here in the ER.  Patient was hypertensive, reports missing her nighttime medication.  She takes labetalol normally, was given her normal dose with additional IV labetalol with improvement of her blood pressure.  Patient's chest pain is now resolved but she continues to have right posterior back pain.  Upon repeat examination, the area is tender to the  touch.  She has improvement when she turns to the left and worsens when she turns the right, likely musculoskeletal.  Her d-dimer, however, was elevated.  Patient did not wish to undergo CT angiography because she is a renal transplant patient.  I was in agreement with this, patient will have a VQ scan.  If negative she will be appropriate for outpatient management of musculoskeletal pain, follow-up promptly with PCP.  Final Clinical Impressions(s) / ED Diagnoses   Final diagnoses:  Chest pain, unspecified type  Right-sided thoracic back pain, unspecified chronicity    ED Discharge Orders    None       Orpah Greek, MD 01/25/18 [phone]

## 2018-01-25 ENCOUNTER — Emergency Department (HOSPITAL_COMMUNITY): Payer: Medicare Other

## 2018-01-25 DIAGNOSIS — R079 Chest pain, unspecified: Secondary | ICD-10-CM | POA: Diagnosis not present

## 2018-01-25 LAB — POCT I-STAT TROPONIN I: Troponin i, poc: 0.01 ng/mL (ref 0.00–0.08)

## 2018-01-25 LAB — D-DIMER, QUANTITATIVE: D-Dimer, Quant: 1.02 ug/mL-FEU — ABNORMAL HIGH (ref 0.00–0.50)

## 2018-01-25 MED ORDER — LABETALOL HCL 5 MG/ML IV SOLN
10.0000 mg | Freq: Once | INTRAVENOUS | Status: AC
  Administered 2018-01-25: 10 mg via INTRAVENOUS
  Filled 2018-01-25: qty 4

## 2018-01-25 MED ORDER — DIPHENHYDRAMINE HCL 50 MG/ML IJ SOLN
25.0000 mg | Freq: Once | INTRAMUSCULAR | Status: AC
  Administered 2018-01-25: 25 mg via INTRAVENOUS
  Filled 2018-01-25: qty 1

## 2018-01-25 MED ORDER — ONDANSETRON HCL 4 MG/2ML IJ SOLN
4.0000 mg | Freq: Once | INTRAMUSCULAR | Status: AC
  Administered 2018-01-25: 4 mg via INTRAVENOUS
  Filled 2018-01-25: qty 2

## 2018-01-25 MED ORDER — TECHNETIUM TO 99M ALBUMIN AGGREGATED
4.4000 | Freq: Once | INTRAVENOUS | Status: AC | PRN
  Administered 2018-01-25: 4.4 via INTRAVENOUS

## 2018-01-25 MED ORDER — TECHNETIUM TC 99M DIETHYLENETRIAME-PENTAACETIC ACID
30.7000 | Freq: Once | INTRAVENOUS | Status: AC | PRN
  Administered 2018-01-25: 30.7 via INTRAVENOUS

## 2018-01-25 MED ORDER — LABETALOL HCL 200 MG PO TABS
400.0000 mg | ORAL_TABLET | Freq: Once | ORAL | Status: AC
  Administered 2018-01-25: 400 mg via ORAL
  Filled 2018-01-25: qty 2

## 2018-01-25 MED ORDER — MORPHINE SULFATE (PF) 4 MG/ML IV SOLN
4.0000 mg | Freq: Once | INTRAVENOUS | Status: AC
  Administered 2018-01-25: 4 mg via INTRAVENOUS
  Filled 2018-01-25: qty 1

## 2018-01-25 NOTE — ED Notes (Signed)
Patient transported to nuclear medicine 

## 2018-01-25 NOTE — ED Provider Notes (Signed)
Patient signed out to me by Dr. Betsey Holiday at 8 AM.  Patient with chest pain and awaiting VQ scan to rule out PE.  Patient had elevated d-dimer.  Has a history of renal transplant and would prefer to not perform CT angiography given that patient is stable.  Patient with unremarkable EKG.  Unremarkable troponins.  Suspicion is likely for musculoskeletal type pain.  Patient awaiting VQ scan.  Upon my evaluation patient is pain-free.  Overall well-appearing.  Will follow-up VQ scan.  VQ unremarkable. D.c in good condition, fu with PCP.    Lennice Sites, DO 01/25/18 2032
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
## 2018-01-25 NOTE — ED Notes (Signed)
Pt stated that she previously had a kidney transplant and was worried about the contrast affecting it. MD notified and will talk to pt regarding choices

## 2018-01-25 NOTE — ED Notes (Signed)
Spoke with Endo and pt will be seen around 10am

## 2018-02-13 IMAGING — DX DG CHEST 2V
2 series · 2 of 2 positions shown · non-contrast
Comparison: Radiographs and CT July 2015, radiographs 06/17/2015

CLINICAL DATA: Chest pain and pressure for 2 weeks, intermittent,
progressed today. Shortness of breath.

EXAM:
CHEST  2 VIEW

[w chest pa]
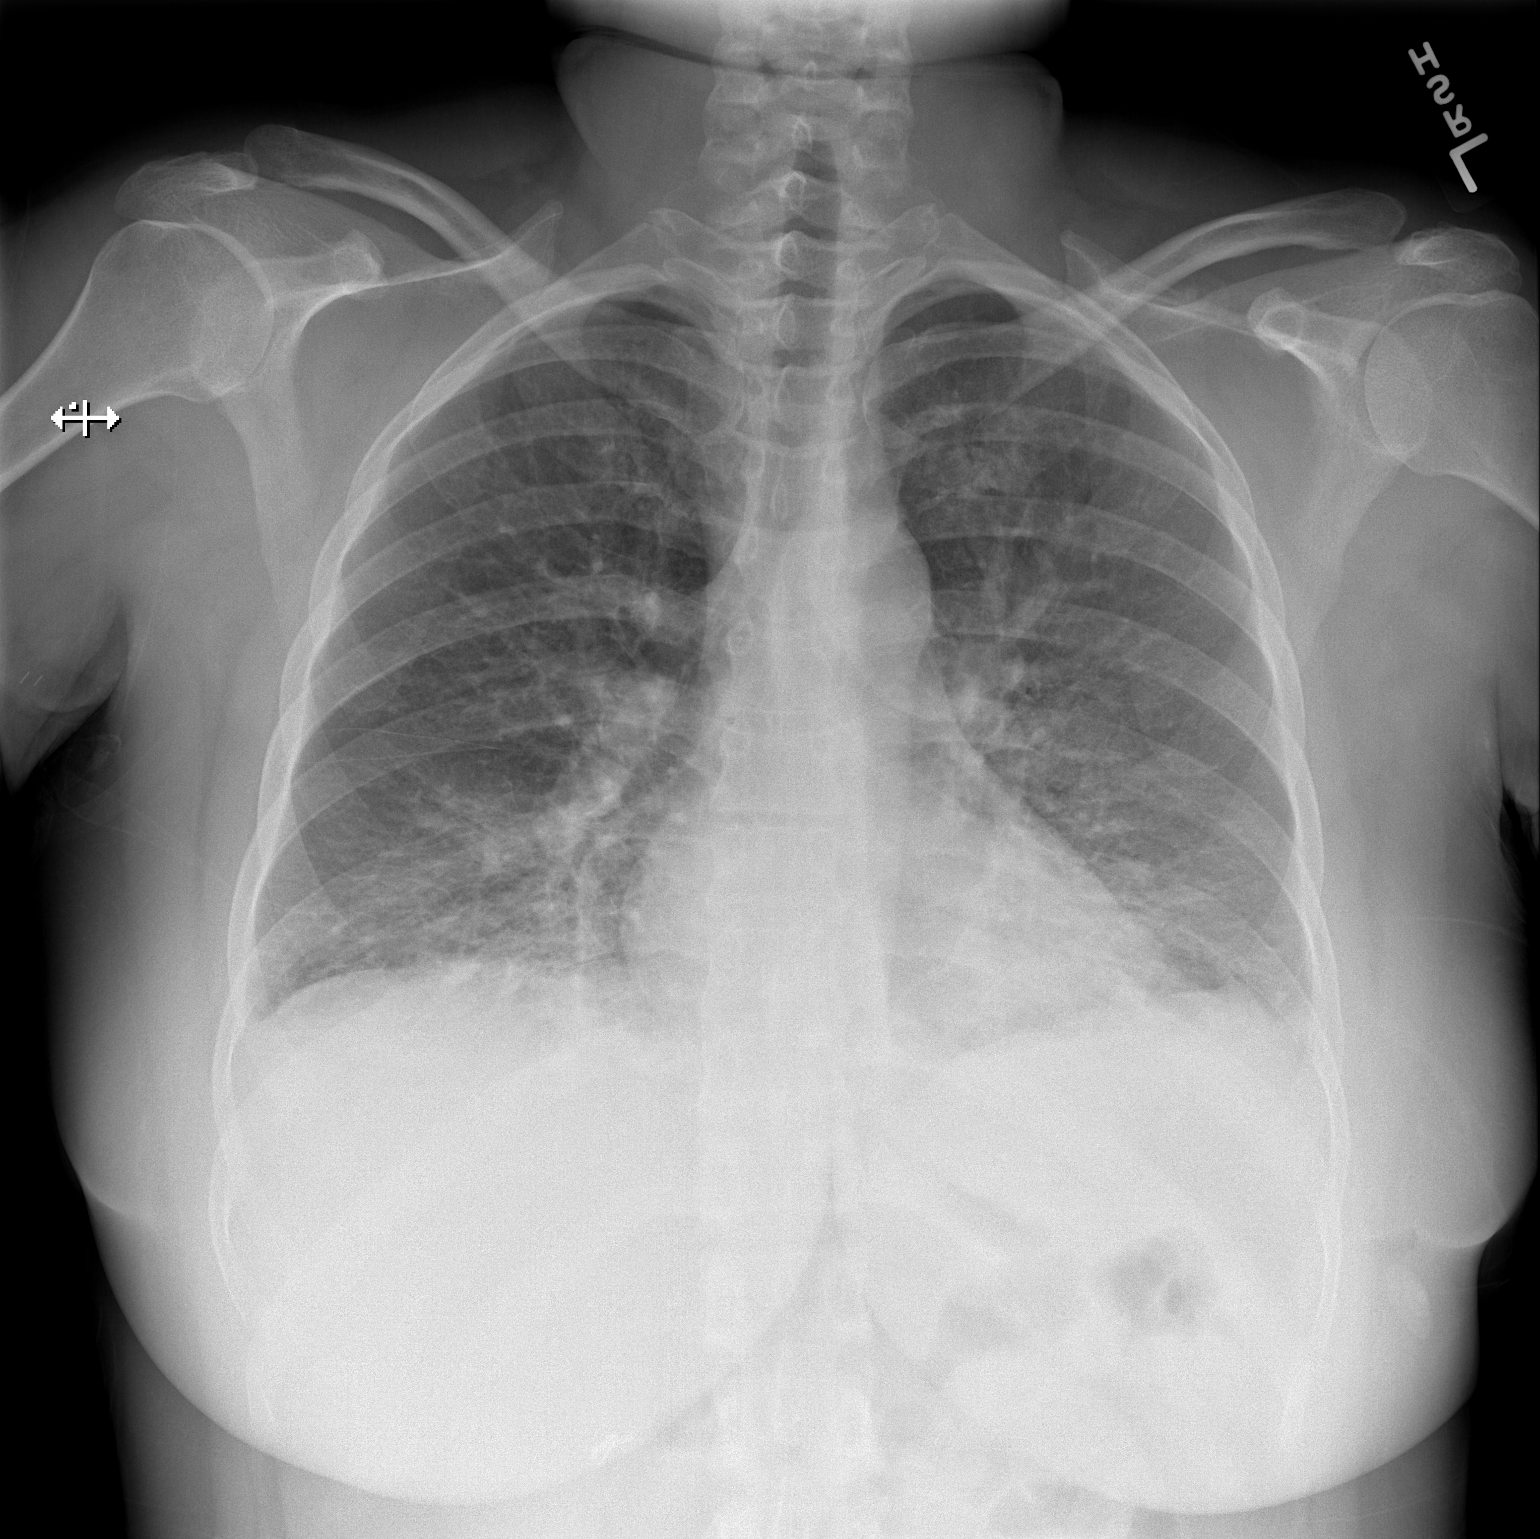

[w chest lat]
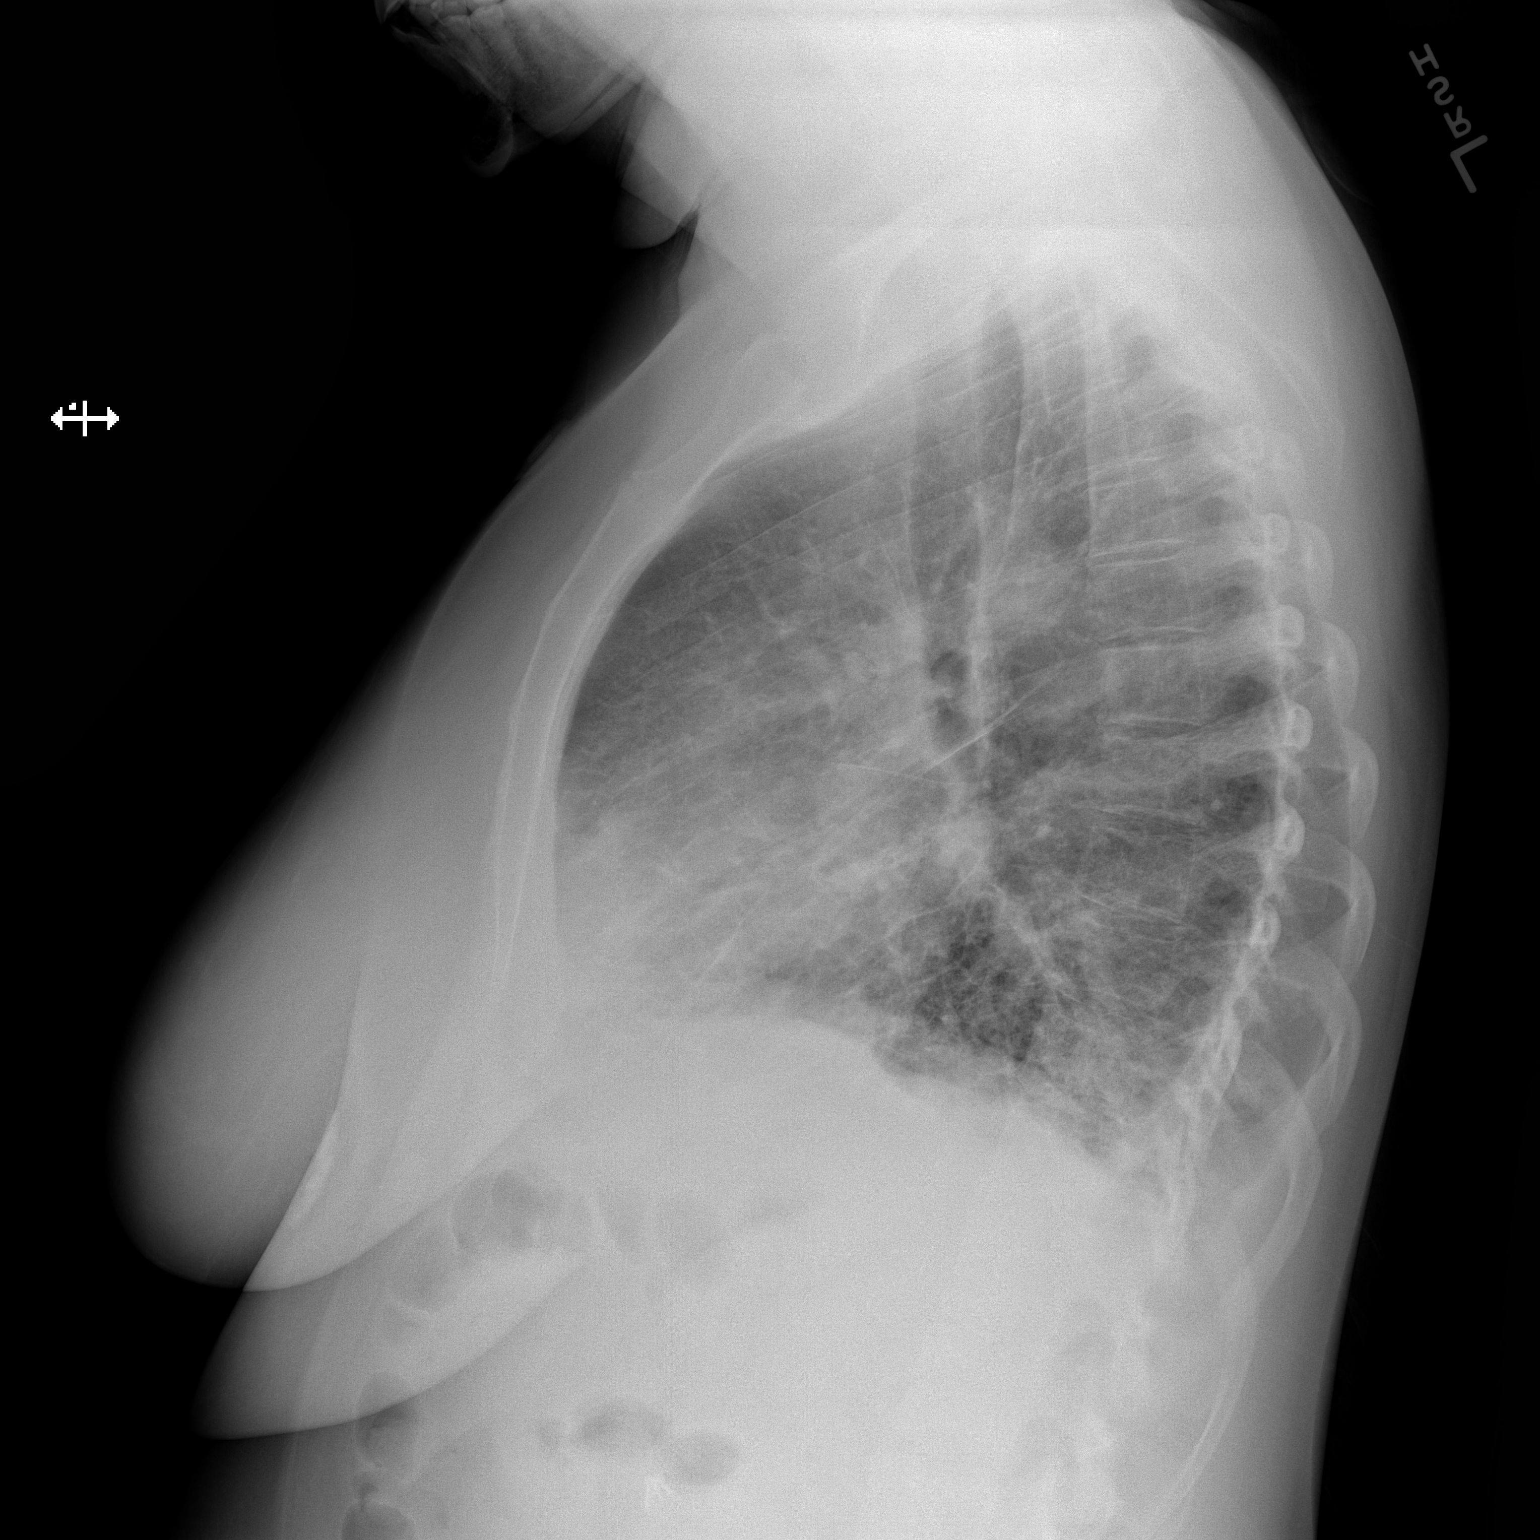

[2 of 2 positions shown; findings below may reference images not displayed]

FINDINGS: Worsening bibasilar interstitial opacities, likely pulmonary edema.
Small bilateral pleural effusions, left greater than right. The
heart size and mediastinal contours are unchanged. No confluent
airspace disease. No pneumothorax.
IMPRESSION: Interstitial bibasilar opacities consistent likely worsening
pulmonary edema. This appears waxing and waning within July 2015,
currently increased. Small pleural effusions again seen.

## 2018-02-20 IMAGING — CR DG CHEST 2V
2 series · 2 of 2 positions shown · non-contrast
Comparison: PA and lateral chest 09/01/2015, 07/13/2015 and
02/24/2015. CT chest 07/12/2015.

CLINICAL DATA: Cough, shortness of breath and chest tightness
beginning yesterday.

EXAM:
CHEST  2 VIEW

[w chest pa]
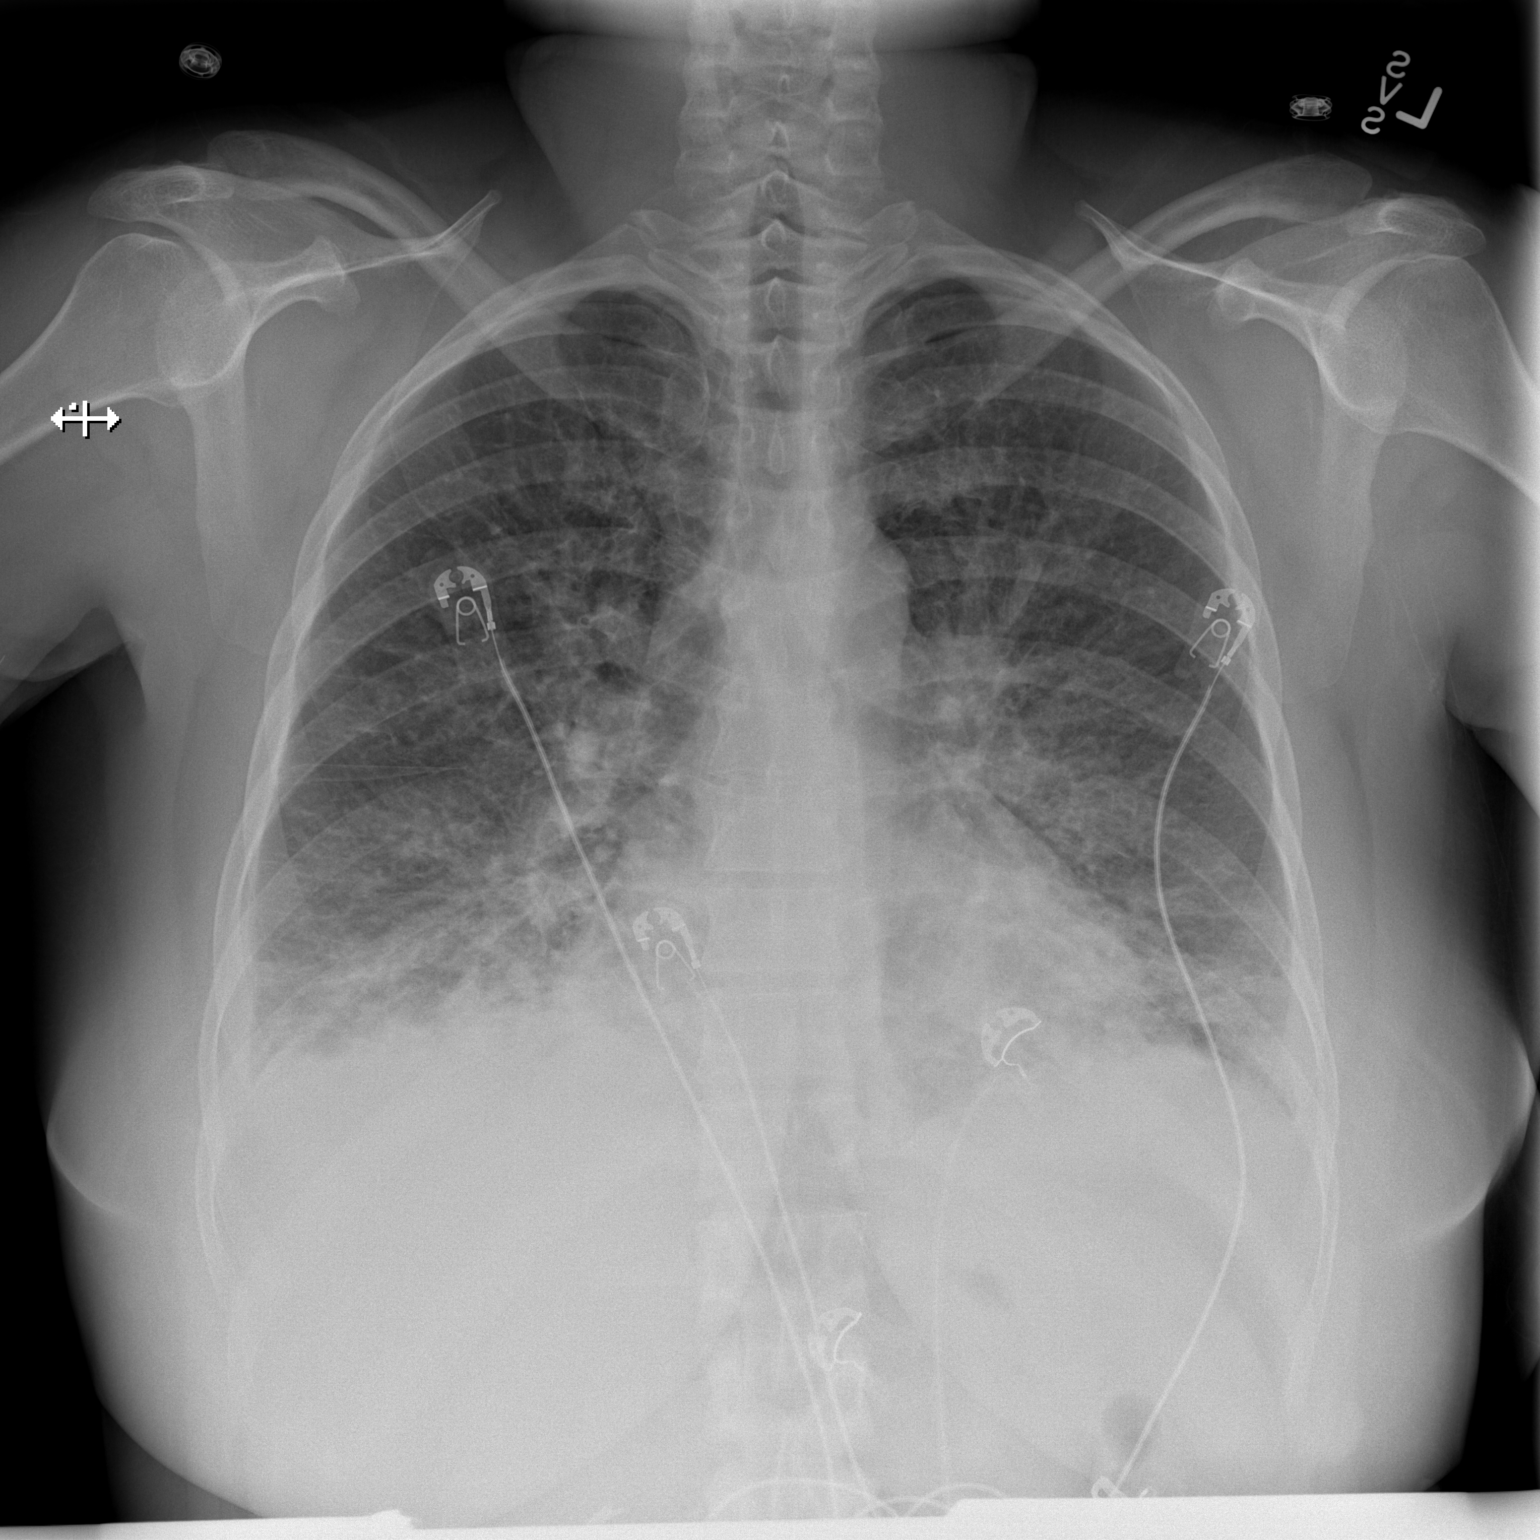

[w chest lat]
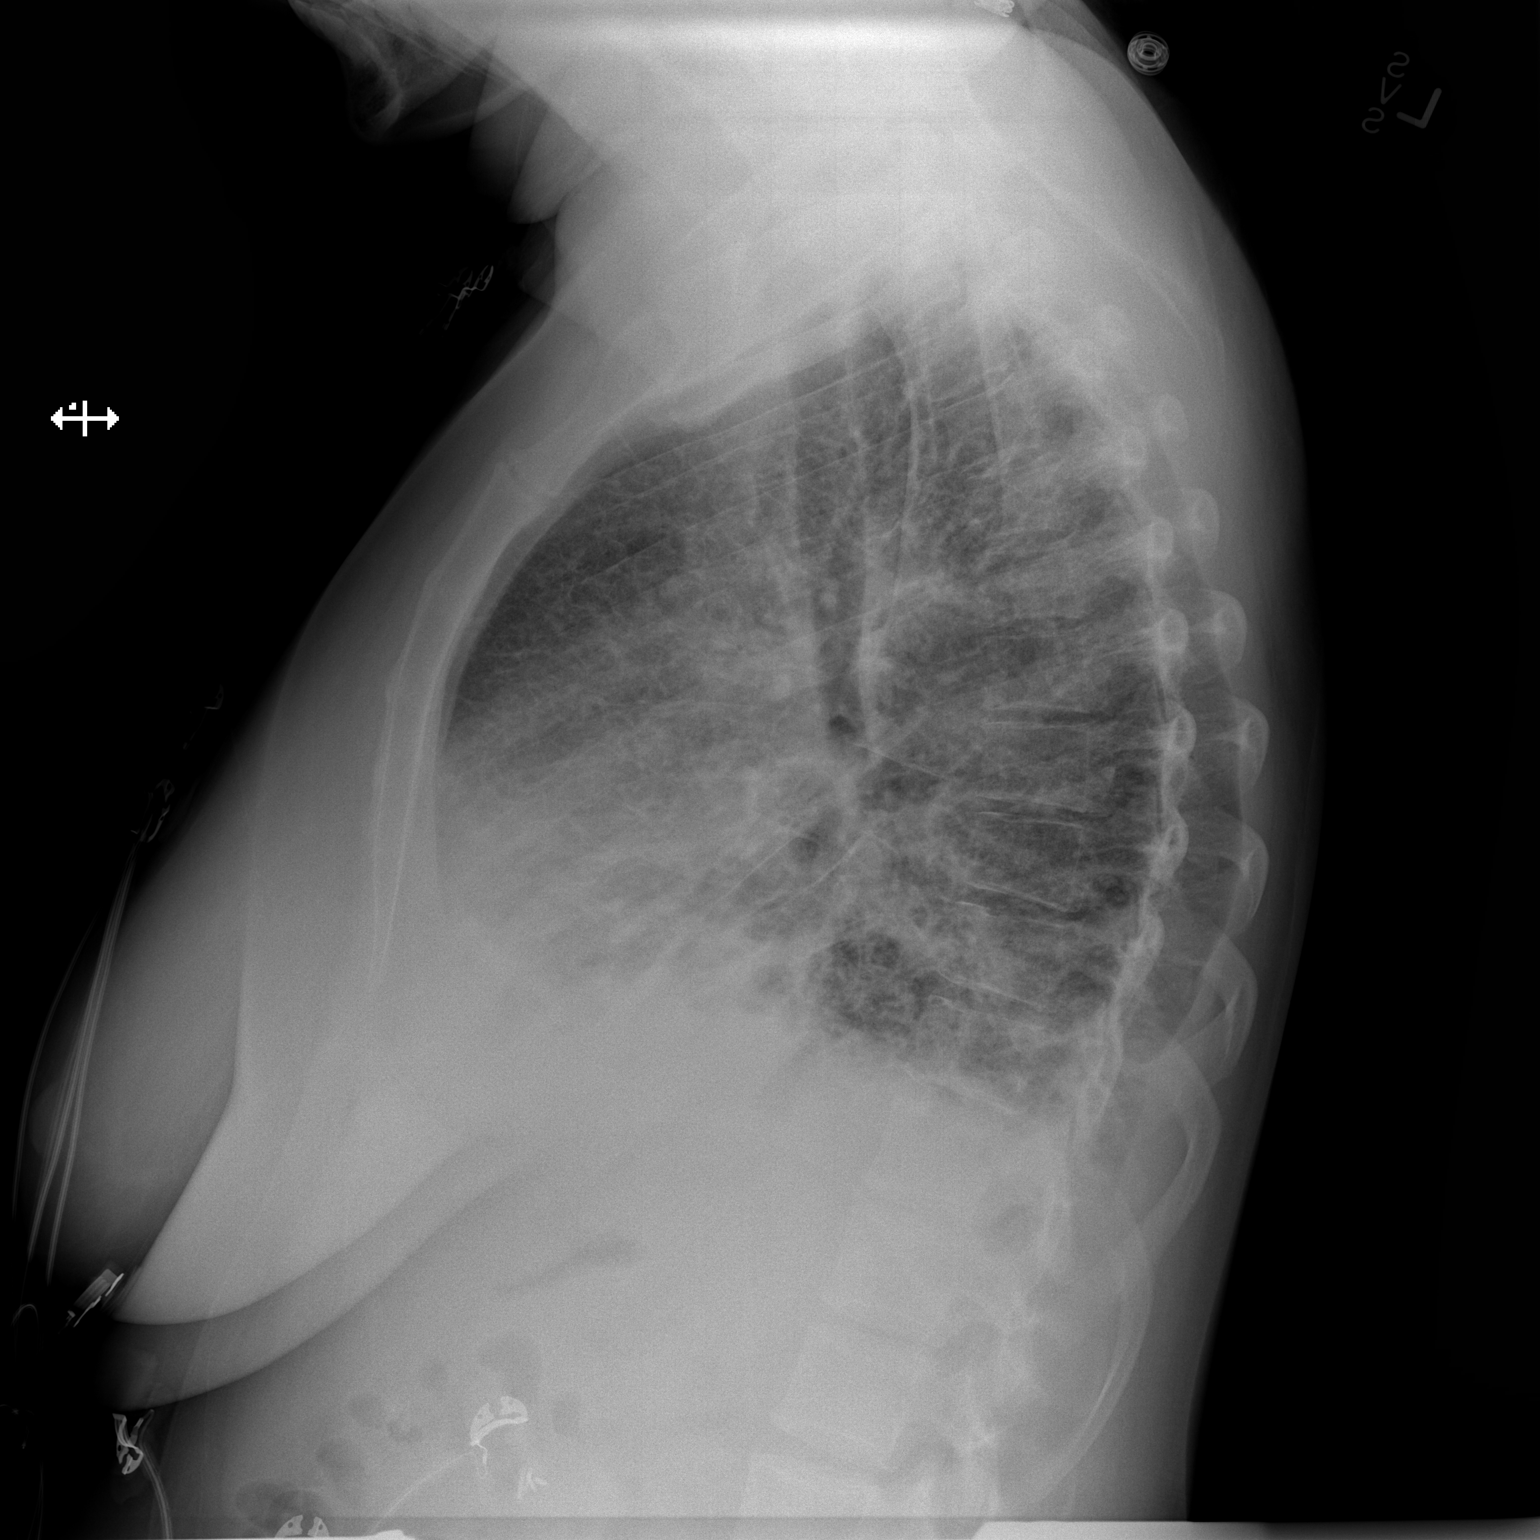

[2 of 2 positions shown; findings below may reference images not displayed]

FINDINGS: The patient has new small bilateral pleural effusions. Bilateral
airspace disease appearing worst the bases has increased since the
most recent examination. No pneumothorax is identified. Heart size
is mildly enlarged.
IMPRESSION: Increased bilateral airspace disease appearing worst in the bases
with small effusions could be due to pulmonary edema and/or
pneumonia.

## 2018-03-03 DIAGNOSIS — N939 Abnormal uterine and vaginal bleeding, unspecified: Secondary | ICD-10-CM | POA: Insufficient documentation

## 2018-03-03 DIAGNOSIS — N930 Postcoital and contact bleeding: Secondary | ICD-10-CM | POA: Insufficient documentation

## 2018-03-03 DIAGNOSIS — D649 Anemia, unspecified: Secondary | ICD-10-CM | POA: Insufficient documentation

## 2018-03-03 DIAGNOSIS — R87619 Unspecified abnormal cytological findings in specimens from cervix uteri: Secondary | ICD-10-CM | POA: Insufficient documentation

## 2018-04-26 ENCOUNTER — Emergency Department (HOSPITAL_BASED_OUTPATIENT_CLINIC_OR_DEPARTMENT_OTHER): Payer: Medicare Other

## 2018-04-26 ENCOUNTER — Encounter (HOSPITAL_BASED_OUTPATIENT_CLINIC_OR_DEPARTMENT_OTHER): Payer: Self-pay

## 2018-04-26 ENCOUNTER — Emergency Department (HOSPITAL_BASED_OUTPATIENT_CLINIC_OR_DEPARTMENT_OTHER)
Admission: EM | Admit: 2018-04-26 | Discharge: 2018-04-27 | Disposition: A | Discharge status: Home / Self Care | Payer: Medicare Other | Attending: Emergency Medicine | Admitting: Emergency Medicine

## 2018-04-26 ENCOUNTER — Other Ambulatory Visit: Payer: Self-pay

## 2018-04-26 DIAGNOSIS — N186 End stage renal disease: Secondary | ICD-10-CM | POA: Diagnosis not present

## 2018-04-26 DIAGNOSIS — B9789 Other viral agents as the cause of diseases classified elsewhere: Secondary | ICD-10-CM | POA: Insufficient documentation

## 2018-04-26 DIAGNOSIS — Z79899 Other long term (current) drug therapy: Secondary | ICD-10-CM | POA: Diagnosis not present

## 2018-04-26 DIAGNOSIS — R05 Cough: Secondary | ICD-10-CM

## 2018-04-26 DIAGNOSIS — J069 Acute upper respiratory infection, unspecified: Secondary | ICD-10-CM | POA: Diagnosis not present

## 2018-04-26 DIAGNOSIS — J45909 Unspecified asthma, uncomplicated: Secondary | ICD-10-CM | POA: Diagnosis not present

## 2018-04-26 DIAGNOSIS — Z940 Kidney transplant status: Secondary | ICD-10-CM | POA: Insufficient documentation

## 2018-04-26 DIAGNOSIS — R059 Cough, unspecified: Secondary | ICD-10-CM

## 2018-04-26 DIAGNOSIS — I2510 Atherosclerotic heart disease of native coronary artery without angina pectoris: Secondary | ICD-10-CM | POA: Diagnosis not present

## 2018-04-26 DIAGNOSIS — Z992 Dependence on renal dialysis: Secondary | ICD-10-CM | POA: Insufficient documentation

## 2018-04-26 DIAGNOSIS — I120 Hypertensive chronic kidney disease with stage 5 chronic kidney disease or end stage renal disease: Secondary | ICD-10-CM | POA: Insufficient documentation

## 2018-04-26 DIAGNOSIS — E1122 Type 2 diabetes mellitus with diabetic chronic kidney disease: Secondary | ICD-10-CM | POA: Diagnosis not present

## 2018-04-26 LAB — CBC WITH DIFFERENTIAL/PLATELET
ABS IMMATURE GRANULOCYTES: 0.11 10*3/uL — AB (ref 0.00–0.07)
BASOS ABS: 0 10*3/uL (ref 0.0–0.1)
Basophils Relative: 0 %
Eosinophils Absolute: 0.1 10*3/uL (ref 0.0–0.5)
Eosinophils Relative: 1 %
HCT: 41.1 % (ref 36.0–46.0)
HEMOGLOBIN: 12.2 g/dL (ref 12.0–15.0)
Immature Granulocytes: 1 %
LYMPHS PCT: 6 %
Lymphs Abs: 0.8 10*3/uL (ref 0.7–4.0)
MCH: 25.2 pg — ABNORMAL LOW (ref 26.0–34.0)
MCHC: 29.7 g/dL — ABNORMAL LOW (ref 30.0–36.0)
MCV: 84.9 fL (ref 80.0–100.0)
MONO ABS: 0.9 10*3/uL (ref 0.1–1.0)
Monocytes Relative: 7 %
NEUTROS ABS: 11.1 10*3/uL — AB (ref 1.7–7.7)
NRBC: 0 % (ref 0.0–0.2)
Neutrophils Relative %: 85 %
Platelets: 371 10*3/uL (ref 150–400)
RBC: 4.84 MIL/uL (ref 3.87–5.11)
RDW: 13 % (ref 11.5–15.5)
WBC: 13 10*3/uL — AB (ref 4.0–10.5)

## 2018-04-26 LAB — BASIC METABOLIC PANEL
Anion gap: 8 (ref 5–15)
BUN: 15 mg/dL (ref 6–20)
CHLORIDE: 105 mmol/L (ref 98–111)
CO2: 21 mmol/L — ABNORMAL LOW (ref 22–32)
Calcium: 9.2 mg/dL (ref 8.9–10.3)
Creatinine, Ser: 0.91 mg/dL (ref 0.44–1.00)
GFR calc non Af Amer: >60 mL/min (ref >60)
Glucose, Bld: 225 mg/dL — ABNORMAL HIGH (ref 70–99)
POTASSIUM: 4.2 mmol/L (ref 3.5–5.1)
SODIUM: 134 mmol/L — AB (ref 135–145)

## 2018-04-26 LAB — I-STAT CG4 LACTIC ACID, ED: LACTIC ACID, VENOUS: 1.17 mmol/L (ref 0.5–1.9)

## 2018-04-26 LAB — PREGNANCY, URINE: Preg Test, Ur: NEGATIVE

## 2018-04-26 LAB — GROUP A STREP BY PCR: Group A Strep by PCR: NOT DETECTED

## 2018-04-26 LAB — URINALYSIS, ROUTINE W REFLEX MICROSCOPIC
BILIRUBIN URINE: NEGATIVE
Glucose, UA: >=500 mg/dL — AB
KETONES UR: NEGATIVE mg/dL
Leukocytes, UA: NEGATIVE
NITRITE: NEGATIVE
Protein, ur: >300 mg/dL — AB
SPECIFIC GRAVITY, URINE: 1.02 (ref 1.005–1.030)
pH: 6.5 (ref 5.0–8.0)

## 2018-04-26 LAB — URINALYSIS, MICROSCOPIC (REFLEX)

## 2018-04-26 MED ORDER — SODIUM CHLORIDE 0.9 % IV BOLUS
500.0000 mL | Freq: Once | INTRAVENOUS | Status: AC
  Administered 2018-04-26: 500 mL via INTRAVENOUS

## 2018-04-26 MED ORDER — ACETAMINOPHEN 325 MG PO TABS
650.0000 mg | ORAL_TABLET | Freq: Once | ORAL | Status: AC
  Administered 2018-04-26: 650 mg via ORAL
  Filled 2018-04-26: qty 2

## 2018-04-26 NOTE — ED Triage Notes (Signed)
C/o flu like sx x 1 week-NAD-steady gait 

## 2018-04-26 NOTE — ED Notes (Signed)
PT states they have to pick up their child. Informed of provider will see them as soon as possible.

## 2018-04-26 NOTE — ED Notes (Signed)
ED Provider at bedside. 

## 2018-04-26 NOTE — ED Provider Notes (Signed)
[facility] EMERGENCY DEPARTMENT Provider Note   CSN: [id number] Arrival date & time: 04/26/18  1916     History   Chief Complaint Chief Complaint  Patient presents with  . Cough    HPI Linda Ellison is a 43 y.o. female.  HPI   KASSONDRA GEIL is a 43 y.o. female, with a history of asthma and kidney transplant, presenting to the ED with cough and congestion for the last 2 weeks.  Accompanied by subjective fever. Patient was seen at urgent care on January 14 and prescribed 10 days of Augmentin.  Patient developed a sore throat 2 days ago and stopped taking the Augmentin because it hurt her throat to swallow. Patient was also seen at urgent care earlier today.  Flu negative at that time. She endorses coughing that leads to gagging.  Denies shortness of breath, syncope, abdominal pain, chest pain, diarrhea, rash, neck pain/stiffness, decreased urination, peripheral edema, flank/back pain, urinary symptoms, or any other complaints.  Past Medical History:  Diagnosis Date  . Allergy   . Anemia associated with chronic renal failure   . Anxiety   . Asthma   . Atypical chest pain    long-standing -- normal cardio cath 06-27-2012 and normal nuclear stress test 06-25-2016  . AV (arteriovenous fistula) (HCC)    for dialysis-- currently located left radiocephalic  . CAD (coronary artery disease) cardiologist-- dr Clydie Braun ([facility] in [place])   a. False positive stress echo 06/2012 at [facility] - cath with no obstructive CAD at [facility] (mild luminal irregularities in LAD, moderate diffuse disease in distal RCA).  . Depression   . Elevated lipids   . ESRD on hemodialysis [facility]) NEPHROLOGIST-  DR MATTINGLY   started dialysis 01/29/11: [place] on MWF  . Gastroparesis   . GERD (gastroesophageal reflux disease)   . History of pneumonia    HCAP 04/ 2017  . Hyperlipidemia   . Hyperthyroidism   . Menorrhagia   . Other secondary  hypertension    associated to diabetes -- followed by cardiologist ( dr Clydie Braun)    . Peripheral neuropathy   . PONV (postoperative nausea and vomiting)   . Pre-transplant evaluation for kidney transplant    [facility]  . Secondary hyperparathyroidism of renal origin ([place])   . Type 2 diabetes mellitus, with long-term current use of insulin ([place])    dx 1985    Patient Active Problem List   Diagnosis Date Noted  . PTSD (post-traumatic stress disorder) 11/22/2017  . MDD (major depressive disorder), recurrent, severe, with psychosis ([place]) 11/21/2017  . Kidney transplant recipient 06/16/2017  . Steal syndrome of dialysis vascular access ([place]) 06/16/2017  . PNA (pneumonia) 09/08/2015  . Chest pain 07/12/2015  . Personal history of adenomatouscolonic polyps 10/26/2011  . Diabetes mellitus with nephropathy ([place]) 08/11/2011  . Diabetic nephropathy ([place]) 01/26/2011  . Obesity, unspecified 08/08/2008  . GERD 08/08/2008  . Essential hypertension 04/06/1999    Past Surgical History:  Procedure Laterality Date  . AV FISTULA PLACEMENT  08/2010   Left radiocephalic AVF  . AV FISTULA PLACEMENT Right 05/07/2014   Procedure: ARTERIOVENOUS (AV) FISTULA CREATION;  Surgeon: Elam Dutch, MD;  Location: [facility] OR;  Service: Vascular;  Laterality: Right;  . AV FISTULA PLACEMENT Right 07/02/2014   Procedure: INSERTION OF ARTERIOVENOUS (AV) GORE-TEX GRAFT ARM;  Surgeon: Elam Dutch, MD;  Location: [facility];  Service: Vascular;  Laterality: Right;  . CARDIOVASCULAR STRESS  TEST  06/25/2016   [place]   normal nuclear perfusion study w/ no ischemia/  normal LV function and wall motion , ef 51%  . CESAREAN SECTION  03/22/2001   w/ Bilateral Tubal Ligation  . COLONOSCOPY    . COLONOSCOPY WITH ESOPHAGOGASTRODUODENOSCOPY (EGD)  10/19/2011  . DILATION AND CURETTAGE OF UTERUS  05/12/2000   w/ suction for missed ab  . DOBUTAMINE STRESS ECHO  06/04/2016    [facility]   normal stress echo  w/ no chest pain or ischemia/  normal LV function and wall motion , stress ef 60-65%  . ESOPHAGOGASTRODUODENOSCOPY  10/19/2011   Dr. Silvano Rusk  . FEMUR IM NAIL Left child   removed  . FOOT SURGERY Left 2014 approx.  Marland Kitchen HYSTEROSCOPY WITH NOVASURE N/A 08/19/2016   Procedure: HYSTEROSCOPY WITH NOVASURE;  Surgeon: Paula Compton, MD;  Location: [facility];  Service: Gynecology;  Laterality: N/A;  . KIDNEY TRANSPLANT  09/18/2016  . LAPAROSCOPIC CHOLECYSTECTOMY  1994  . LEFT HEART CATHETERIZATION WITH CORONARY ANGIOGRAM N/A 06/27/2012   Procedure: LEFT HEART CATHETERIZATION WITH CORONARY ANGIOGRAM;  Surgeon: Larey Dresser, MD;  Location: [facility] CATH LAB;  Service: Cardiovascular;  Laterality: N/A; No ostructive CAD, normal LVF, ef 55-60% (mild LAD luminal irregularities, moderate dRCA diffuse disease)  . LIGATION GORETEX FISTULA  01/04/11   Left AVF  . LIGATION OF ARTERIOVENOUS  FISTULA Left 08/21/2014   Procedure: LIGATION OF ARTERIOVENOUS  FISTULA;  Surgeon: Algernon Huxley, MD;  Location: [facility] ORS;  Service: Vascular;  Laterality: Left;  . LIGATION OF ARTERIOVENOUS  FISTULA Left 06/29/2017   Procedure: LIGATION OF ARTERIOVENOUS  FISTULA ( RADIOCEPHALIC );  Surgeon: Algernon Huxley, MD;  Location: [facility] ORS;  Service: Vascular;  Laterality: Left;  . PERIPHERAL VASCULAR CATHETERIZATION Left 08/08/2014   Procedure: Upper Extremity Angiography;  Surgeon: Algernon Huxley, MD;  Location: [place] [facility] CV LAB;  Service: Cardiovascular;  Laterality: Left;  . PERIPHERAL VASCULAR CATHETERIZATION Left 08/08/2014   Procedure: Upper Extremity Intervention;  Surgeon: Algernon Huxley, MD;  Location: [facility] CV LAB;  Service: Cardiovascular;  Laterality: Left;  . PERIPHERAL VASCULAR CATHETERIZATION Left 03/08/2016   Procedure: A/V Fistulagram;  Surgeon: Algernon Huxley, MD;  Location: [facility] CV LAB;  Service: Cardiovascular;  Laterality: Left;  . POLYPECTOMY    . THROMBECTOMY AND REVISION OF ARTERIOVENTOUS  (AV) GORETEX  GRAFT Right 07/16/2014   Procedure: THROMBECTOMY  Right  arm  ARTERIOVENOUS  GORETEX  GRAFT;  Surgeon: Elam Dutch, MD;  Location: [facility];  Service: Vascular;  Laterality: Right;  . TRANSTHORACIC ECHOCARDIOGRAM  06/04/2016   mild concentric LVH, ef 55-60%,  mild TR,  borderline LAE,  trivial MR and PR     OB History    Gravida  3   Para  1   Term      Preterm  1   AB  1   Living  2     SAB  1   TAB      Ectopic      Multiple      Live Births  2            Home Medications    Prior to Admission medications   Medication Sig Start Date End Date Taking? Authorizing Provider  albuterol (PROVENTIL HFA;VENTOLIN HFA) 108 (90 Base) MCG/ACT inhaler Inhale 2 puffs into the lungs every 6 (six) hours as needed for wheezing or shortness of breath. 11/25/17   Encarnacion Slates, NP  amitriptyline (ELAVIL) 10 MG tablet Take 10 mg by mouth at bedtime.    [provider]  amLODipine (NORVASC) 5 MG tablet Take 10 mg by mouth daily.    [provider]  ARIPiprazole (ABILIFY) 10 MG tablet Take 1 tablet (10 mg total) by mouth daily. For mood control Patient not taking: Reported on 01/25/2018 11/26/17   Lindell Spar I, NP  buPROPion (WELLBUTRIN XL) 150 MG 24 hr tablet Take 1 tablet (150 mg total) by mouth daily after breakfast. For depression 11/26/17   Lindell Spar I, NP  cetirizine (ZYRTEC) 10 MG tablet Take 10 mg by mouth 2 (two) times daily.    [provider]  citalopram (CELEXA) 40 MG tablet Take 40 mg by mouth daily.    [provider]  cloNIDine (CATAPRES) 0.3 MG tablet Take 0.3 mg by mouth 3 (three) times daily.     [provider]  furosemide (LASIX) 20 MG tablet Take 1 tablet (20 mg total) by mouth daily. For swellings Patient taking differently: Take 40 mg by mouth daily.  11/26/17   Lindell Spar I, NP  gabapentin (NEURONTIN) 300 MG capsule Take 300 mg by mouth at bedtime as needed. pain    [provider]    insulin aspart (NOVOLOG) 100 UNIT/ML injection Inject 10 Units into the skin daily with breakfast. For diabetes management Patient taking differently: Inject 6-11 Units into the skin See admin instructions. Base dose is 6. BS greater than 130 use sliding scale. 150-200 = 7 units; 201-250=8 units; 251-300=9 units; 301-350=10 units; 351-400=11 units and call the doctor 11/26/17   Lindell Spar I, NP  insulin aspart (NOVOLOG) 100 UNIT/ML injection Inject 14 Units into the skin 2 (two) times daily before lunch and supper. For diabetes management Patient not taking: Reported on 01/25/2018 11/25/17   Lindell Spar I, NP  insulin glargine (LANTUS) 100 UNIT/ML injection Inject 0.26 mLs (26 Units total) into the skin at bedtime. For diabetes management Patient not taking: Reported on 01/25/2018 11/25/17   Lindell Spar I, NP  Insulin Human (INSULIN PUMP) SOLN Inject 1 each into the skin See admin instructions.    [provider]  labetalol (NORMODYNE) 200 MG tablet Take 2 tablets (400 mg total) by mouth 3 (three) times daily. For high blood pressure 11/25/17   Nwoko, Herbert Pun I, NP  magnesium oxide (MAG-OX) 400 (241.3 Mg) MG tablet Take 1 tablet (400 mg total) by mouth daily at 12 noon. For low Magnesium Patient taking differently: Take 400 mg by mouth daily at 12 noon.  11/25/17   Lindell Spar I, NP  montelukast (SINGULAIR) 10 MG tablet Take 10 mg by mouth at bedtime.    [provider]  mycophenolate (MYFORTIC) 360 MG TBEC EC tablet Take 2 tablets (720 mg total) by mouth 2 (two) times daily. For renal transplant 11/25/17   Lindell Spar I, NP  omeprazole (PRILOSEC) 20 MG capsule Take 20 mg by mouth 2 (two) times daily before a meal.    [provider]  pantoprazole (PROTONIX) 40 MG tablet Take 1 tablet (40 mg total) by mouth daily. For acid reflux Patient not taking: Reported on 01/25/2018 11/26/17   Lindell Spar I, NP  predniSONE (DELTASONE) 5 MG tablet Take 5 mg by mouth daily with  breakfast.    [provider]  simvastatin (ZOCOR) 10 MG tablet Take 1 tablet (10 mg total) by mouth at bedtime. For high cholesterol 11/25/17   Lindell Spar I, NP  sodium bicarbonate 650  MG tablet Take 1,300 mg by mouth 2 (two) times daily.    [provider]  sulfamethoxazole-trimethoprim (BACTRIM,SEPTRA) 400-80 MG tablet Take 1 tablet by mouth 3 (three) times a week. For infection Patient taking differently: Take 1 tablet by mouth every Monday, Wednesday, and Friday. For infection 11/28/17   Lindell Spar I, NP  tacrolimus (PROGRAF) 1 MG capsule Take 4 mg by mouth 2 (two) times daily.     [provider]  zolpidem (AMBIEN) 5 MG tablet Take 1 tablet (5 mg total) by mouth at bedtime as needed for sleep. 11/25/17   Encarnacion Slates, NP    Family History Family History  Problem Relation Age of Onset  . Heart disease Mother        heart attack at 40 y/o-infected valve  . Kidney disease Mother        was a dialysis patient  . Heart disease Brother   . Kidney disease Maternal Grandmother        pre-dialysis  . Diabetes Father   . Hypertension Father   . Colon cancer Paternal Grandmother   . Colon polyps Paternal Grandmother   . Esophageal cancer Paternal Grandmother   . Rectal cancer Paternal Grandmother   . Stomach cancer Paternal Grandmother   . Kidney failure Paternal Uncle   . Kidney failure Paternal Aunt   . Arthritis Brother   . Diabetes Maternal Aunt   . Hypertension Maternal Aunt   . Diabetes Paternal Aunt   . Heart disease Paternal Aunt   . Hypertension Paternal Aunt     Social History Social History   Tobacco Use  . Smoking status: Never Smoker  . Smokeless tobacco: Never Used  Substance Use Topics  . Alcohol use: No    Alcohol/week: 0.0 standard drinks  . Drug use: No     Allergies   Ciprofloxacin; Fluocinolone; Hydromorphone; Pineapple; Strawberry extract; Phenytoin sodium extended; Tylenol [acetaminophen]; Adhesive [tape]; Chlorhexidine  gluconate; Clindamycin; Oxycodone; and Shrimp [shellfish allergy]   Review of Systems Review of Systems  Constitutional: Positive for fever (subjective). Negative for diaphoresis.  HENT: Positive for congestion and sore throat. Negative for trouble swallowing and voice change.   Respiratory: Positive for cough. Negative for shortness of breath.   Cardiovascular: Negative for chest pain and leg swelling.  Gastrointestinal: Positive for nausea and vomiting (after coughing). Negative for abdominal pain and diarrhea.  Genitourinary: Negative for decreased urine volume, difficulty urinating, dysuria, frequency and hematuria.  Musculoskeletal: Negative for back pain, neck pain and neck stiffness.  Skin: Negative for rash.  Neurological: Negative for syncope and weakness.  All other systems reviewed and are negative.    Physical Exam Updated Vital Signs BP (!) 187/119 (BP Location: Left Arm) Comment: no meds today  Pulse (!) 121   Temp 99.4 F (37.4 C) (Oral)   Resp 20   Ht 5\' 6"  (1.676 m)   Wt 123.4 kg   SpO2 100%   BMI 43.90 kg/m   Physical Exam Vitals signs and nursing note reviewed.  Constitutional:      General: She is not in acute distress.    Appearance: She is well-developed. She is obese. She is not diaphoretic.  HENT:     Head: Normocephalic and atraumatic.     Right Ear: Tympanic membrane, ear canal and external ear normal.     Left Ear: Tympanic membrane, ear canal and external ear normal.     Nose: Mucosal edema and congestion present.     Mouth/Throat:  Mouth: Mucous membranes are moist.     Pharynx: Oropharynx is clear. Uvula midline.     Tonsils: No tonsillar exudate or tonsillar abscesses.     Comments: Handles oral secretions without difficulty.  No trismus.  No swelling noted into the soft tissues of the neck. Eyes:     Conjunctiva/sclera: Conjunctivae normal.  Neck:     Musculoskeletal: Neck supple.  Cardiovascular:     Rate and Rhythm: Regular  rhythm. Tachycardia present.     Pulses: Normal pulses.     Heart sounds: Normal heart sounds.  Pulmonary:     Effort: Pulmonary effort is normal. No respiratory distress.     Breath sounds: Normal breath sounds.  Abdominal:     Palpations: Abdomen is soft.     Tenderness: There is no abdominal tenderness. There is no guarding.  Musculoskeletal:     Right lower leg: No edema.     Left lower leg: No edema.  Lymphadenopathy:     Cervical: No cervical adenopathy.  Skin:    General: Skin is warm and dry.  Neurological:     Mental Status: She is alert.  Psychiatric:        Mood and Affect: Mood and affect normal.        Speech: Speech normal.        Behavior: Behavior normal.      ED Treatments / Results  Labs (all labs ordered are listed, but only abnormal results are displayed) Labs Reviewed  CBC WITH DIFFERENTIAL/PLATELET - Abnormal; Notable for the following components:      Result Value   WBC 13.0 (*)    MCH 25.2 (*)    MCHC 29.7 (*)    Neutro Abs 11.1 (*)    Abs Immature Granulocytes 0.11 (*)    All other components within normal limits  BASIC METABOLIC PANEL - Abnormal; Notable for the following components:   Sodium 134 (*)    CO2 21 (*)    Glucose, Bld 225 (*)    All other components within normal limits  URINALYSIS, ROUTINE W REFLEX MICROSCOPIC - Abnormal; Notable for the following components:   Glucose, UA >=500 (*)    Hgb urine dipstick MODERATE (*)    Protein, ur >300 (*)    All other components within normal limits  URINALYSIS, MICROSCOPIC (REFLEX) - Abnormal; Notable for the following components:   Bacteria, UA RARE (*)    All other components within normal limits  GROUP A STREP BY PCR  CULTURE, BLOOD (ROUTINE X 2)  CULTURE, BLOOD (ROUTINE X 2)  PREGNANCY, URINE  I-STAT CG4 LACTIC ACID, ED    EKG None  Radiology Dg Chest 2 View  Result Date: 04/26/2018 CLINICAL DATA:  Cough for a week.  Shortness of breath. EXAM: CHEST - 2 VIEW COMPARISON:   January 24, 2018 FINDINGS: The heart size and mediastinal contours are within normal limits. Both lungs are clear. The visualized skeletal structures are unremarkable. IMPRESSION: No active cardiopulmonary disease. Electronically Signed   By: Dorise Bullion III M.D   On: 04/26/2018 20:33    Procedures Procedures (including critical care time)  Medications Ordered in ED Medications  sodium chloride 0.9 % bolus 1,000 mL (1,000 mLs Intravenous New Bag/Given 04/27/18 0058)  sodium chloride 0.9 % bolus 500 mL (0 mLs Intravenous Stopped 04/26/18 2351)  acetaminophen (TYLENOL) tablet 650 mg (650 mg Oral Given 04/26/18 2327)  sodium chloride 0.9 % bolus 500 mL (0 mLs Intravenous Stopped 04/27/18 0101)  cloNIDine (CATAPRES)  tablet 0.3 mg (0.3 mg Oral Given 04/27/18 0059)  labetalol (NORMODYNE,TRANDATE) injection 10 mg (10 mg Intravenous Given 04/27/18 0100)     Initial Impression / Assessment and Plan / ED Course  I have reviewed the triage vital signs and the nursing notes.  Pertinent labs & imaging results that were available during my care of the patient were reviewed by me and considered in my medical decision making (see chart for details).     Patient presents with cough, congestion, sore throat. Patient is nontoxic appearing, afebrile, not tachypneic, not hypotensive, maintains excellent SPO2 on room air, and is in no apparent distress.  She is, however, tachycardic.  She denies chest pain or palpitations. No signs of bacterial infection on strep test, chest x-ray, or urine.  Influenza negative at urgent care. Patient notes she has not taken any of her medications today, which include labetalol and clonidine.  Rebound tachycardia could be a possible explanation for her persistent tachycardia. Another reason could be her hyperthyroidism with symptoms and heart rate typically treated with labetalol.  Patient states she was CMV positive in the beginning after transplant, however, repeat tests have  been negative.  Last CMV test noted in care everywhere was July 2019 and was negative.   Findings and plan of care discussed with Gareth Morgan, MD. Dr. Billy Fischer personally evaluated and examined this patient.  End of shift patient care handoff report given to Dr. Randal Buba.  Plan: Finish IV fluids, reevaluate heart rate. If heart rate has decreased to a more acceptable level, discharge patient.  Recommend close follow-up with PCP and transplant team.  Vitals:   04/26/18 2226 04/26/18 2226 04/27/18 0010 04/27/18 0059  BP: (!) 181/120  (!) 167/103 (!) 167/98  Pulse: (!) 116  (!) 119   Resp: 20  18   Temp: 99.8 F (37.7 C) 99.3 F (37.4 C) 98.5 F (36.9 C)   TempSrc: Oral Oral Oral   SpO2: 98%  98%   Weight:      Height:         Final Clinical Impressions(s) / ED Diagnoses   Final diagnoses:  Cough    ED Discharge Orders    None       Layla Maw 04/27/18 0114    Gareth Morgan, MD 04/28/18 2014

## 2018-04-27 ENCOUNTER — Other Ambulatory Visit: Payer: Self-pay

## 2018-04-27 DIAGNOSIS — J069 Acute upper respiratory infection, unspecified: Secondary | ICD-10-CM | POA: Diagnosis not present

## 2018-04-27 MED ORDER — SODIUM CHLORIDE 0.9 % IV BOLUS
1000.0000 mL | Freq: Once | INTRAVENOUS | Status: AC
  Administered 2018-04-27: 1000 mL via INTRAVENOUS

## 2018-04-27 MED ORDER — CLONIDINE HCL 0.1 MG PO TABS
0.3000 mg | ORAL_TABLET | Freq: Once | ORAL | Status: AC
  Administered 2018-04-27: 0.3 mg via ORAL
  Filled 2018-04-27: qty 3

## 2018-04-27 MED ORDER — LABETALOL HCL 200 MG PO TABS
400.0000 mg | ORAL_TABLET | Freq: Once | ORAL | Status: DC
  Filled 2018-04-27: qty 2

## 2018-04-27 MED ORDER — LABETALOL HCL 5 MG/ML IV SOLN
10.0000 mg | Freq: Once | INTRAVENOUS | Status: AC
  Administered 2018-04-27: 10 mg via INTRAVENOUS
  Filled 2018-04-27: qty 4

## 2018-04-27 NOTE — ED Notes (Signed)
PT states understanding of care given, follow up care. PT ambulated from ED to car with a steady gait.  

## 2018-05-02 LAB — CULTURE, BLOOD (ROUTINE X 2)
Culture: NO GROWTH
Culture: NO GROWTH
SPECIAL REQUESTS: ADEQUATE
Special Requests: ADEQUATE

## 2018-08-27 ENCOUNTER — Other Ambulatory Visit: Payer: Self-pay

## 2018-08-27 ENCOUNTER — Emergency Department (HOSPITAL_BASED_OUTPATIENT_CLINIC_OR_DEPARTMENT_OTHER): Payer: Medicare Other

## 2018-08-27 ENCOUNTER — Emergency Department (HOSPITAL_BASED_OUTPATIENT_CLINIC_OR_DEPARTMENT_OTHER)
Admission: EM | Admit: 2018-08-27 | Discharge: 2018-08-27 | Disposition: A | Discharge status: Home / Self Care | Payer: Medicare Other | Attending: Emergency Medicine | Admitting: Emergency Medicine

## 2018-08-27 ENCOUNTER — Encounter (HOSPITAL_BASED_OUTPATIENT_CLINIC_OR_DEPARTMENT_OTHER): Payer: Self-pay | Admitting: Emergency Medicine

## 2018-08-27 DIAGNOSIS — Z79899 Other long term (current) drug therapy: Secondary | ICD-10-CM | POA: Insufficient documentation

## 2018-08-27 DIAGNOSIS — N186 End stage renal disease: Secondary | ICD-10-CM | POA: Insufficient documentation

## 2018-08-27 DIAGNOSIS — Z794 Long term (current) use of insulin: Secondary | ICD-10-CM | POA: Insufficient documentation

## 2018-08-27 DIAGNOSIS — J45909 Unspecified asthma, uncomplicated: Secondary | ICD-10-CM | POA: Insufficient documentation

## 2018-08-27 DIAGNOSIS — R2243 Localized swelling, mass and lump, lower limb, bilateral: Secondary | ICD-10-CM | POA: Diagnosis not present

## 2018-08-27 DIAGNOSIS — I120 Hypertensive chronic kidney disease with stage 5 chronic kidney disease or end stage renal disease: Secondary | ICD-10-CM | POA: Insufficient documentation

## 2018-08-27 DIAGNOSIS — Z940 Kidney transplant status: Secondary | ICD-10-CM | POA: Diagnosis not present

## 2018-08-27 DIAGNOSIS — I2510 Atherosclerotic heart disease of native coronary artery without angina pectoris: Secondary | ICD-10-CM | POA: Diagnosis not present

## 2018-08-27 DIAGNOSIS — E119 Type 2 diabetes mellitus without complications: Secondary | ICD-10-CM | POA: Diagnosis not present

## 2018-08-27 DIAGNOSIS — R600 Localized edema: Secondary | ICD-10-CM

## 2018-08-27 LAB — COMPREHENSIVE METABOLIC PANEL
ALT: 19 U/L (ref 0–44)
AST: 15 U/L (ref 15–41)
Albumin: 3.6 g/dL (ref 3.5–5.0)
Alkaline Phosphatase: 84 U/L (ref 38–126)
Anion gap: 9 (ref 5–15)
BUN: 22 mg/dL — ABNORMAL HIGH (ref 6–20)
CO2: 24 mmol/L (ref 22–32)
Calcium: 9.1 mg/dL (ref 8.9–10.3)
Chloride: 105 mmol/L (ref 98–111)
Creatinine, Ser: 1.02 mg/dL — ABNORMAL HIGH (ref 0.44–1.00)
GFR calc Af Amer: >60 mL/min (ref >60)
GFR calc non Af Amer: >60 mL/min (ref >60)
Glucose, Bld: 186 mg/dL — ABNORMAL HIGH (ref 70–99)
Potassium: 3.8 mmol/L (ref 3.5–5.1)
Sodium: 138 mmol/L (ref 135–145)
Total Bilirubin: 0.5 mg/dL (ref 0.3–1.2)
Total Protein: 7.3 g/dL (ref 6.5–8.1)

## 2018-08-27 LAB — CBC WITH DIFFERENTIAL/PLATELET
Abs Immature Granulocytes: 0.05 10*3/uL (ref 0.00–0.07)
Basophils Absolute: 0 10*3/uL (ref 0.0–0.1)
Basophils Relative: 0 %
Eosinophils Absolute: 0.1 10*3/uL (ref 0.0–0.5)
Eosinophils Relative: 1 %
HCT: 43.6 % (ref 36.0–46.0)
Hemoglobin: 13 g/dL (ref 12.0–15.0)
Immature Granulocytes: 1 %
Lymphocytes Relative: 11 %
Lymphs Abs: 0.8 10*3/uL (ref 0.7–4.0)
MCH: 25.6 pg — ABNORMAL LOW (ref 26.0–34.0)
MCHC: 29.8 g/dL — ABNORMAL LOW (ref 30.0–36.0)
MCV: 86 fL (ref 80.0–100.0)
Monocytes Absolute: 0.6 10*3/uL (ref 0.1–1.0)
Monocytes Relative: 9 %
Neutro Abs: 5.4 10*3/uL (ref 1.7–7.7)
Neutrophils Relative %: 78 %
Platelets: 327 10*3/uL (ref 150–400)
RBC: 5.07 MIL/uL (ref 3.87–5.11)
RDW: 13.5 % (ref 11.5–15.5)
WBC: 6.9 10*3/uL (ref 4.0–10.5)
nRBC: 0 % (ref 0.0–0.2)

## 2018-08-27 LAB — BRAIN NATRIURETIC PEPTIDE: B Natriuretic Peptide: 24 pg/mL (ref 0.0–100.0)

## 2018-08-27 NOTE — ED Provider Notes (Signed)
[place] EMERGENCY DEPARTMENT Provider Note   CSN: [id number] Arrival date & time: 08/27/18  1225    History   Chief Complaint Chief Complaint  Patient presents with   Leg Swelling    HPI Linda Ellison is a 43 y.o. female.     Patient is a 43 year old female with multiple medical problems including end-stage renal disease status post transplant, hypertension, diabetes and nonobstructive coronary artery disease who presents today with lower extremity swelling and weeping.  Patient states that for the last 1 week she has had worsening swelling in bilateral lower extremities and yesterday noticed there was fluid leaking from her legs.  She states that she does check her weight regularly but her scale broke 3 days ago but prior to that her weight had not changed.  She took 120 mg of Lasix last night due to the worsening swelling but did not notice an increased urine output.  She has noted this week that she is slightly more short of breath with exertion but no shortness of breath at rest or with lying down.  Patient does have a history of renal artery stenosis given her transplant is from a 96-year-old child that required stenting last year.  She had a renal artery duplex done in January and reports that it was unchanged.  Patient states her blood pressure has remained in the 1 62-6 40 range systolic and has not changed.  She does check her blood pressure daily.  She has been taking all of her antirejection medication and has not missed any doses.  She denies any fever, cough, congestion, abdominal pain, dysuria frequency or urgency.  She spoke with the transplant office last night who recommended she come for further evaluation.  The history is provided by the patient.    Past Medical History:  Diagnosis Date   Allergy    Anemia associated with chronic renal failure    Anxiety    Asthma    Atypical chest pain    long-standing -- normal cardio cath 06-27-2012 and normal  nuclear stress test 06-25-2016   AV (arteriovenous fistula) ([place])    for dialysis-- currently located left radiocephalic   CAD (coronary artery disease) cardiologist-- dr Clydie Braun ([facility] in [place])   a. False positive stress echo 06/2012 at [facility] - cath with no obstructive CAD at [facility] (mild luminal irregularities in LAD, moderate diffuse disease in distal RCA).   Depression    Elevated lipids    ESRD on hemodialysis [facility] NEPHROLOGIST-  DR MATTINGLY   started dialysis 01/29/11: [place] on MWF   Gastroparesis    GERD (gastroesophageal reflux disease)    History of pneumonia    HCAP 04/ 2017   Hyperlipidemia    Hyperthyroidism    Menorrhagia    Other secondary hypertension    associated to diabetes -- followed by cardiologist ( dr Clydie Braun)     Peripheral neuropathy    PONV (postoperative nausea and vomiting)    Pre-transplant evaluation for kidney transplant    [facility]   Secondary hyperparathyroidism of renal origin [facility])    Type 2 diabetes mellitus, with long-term current use of insulin ([place])    dx 1985    Patient Active Problem List   Diagnosis Date Noted   PTSD (post-traumatic stress disorder) 11/22/2017   MDD (major depressive disorder), recurrent, severe, with psychosis ([place]) 11/21/2017   Kidney transplant recipient 06/16/2017   Steal syndrome of dialysis vascular  access ([place]) 06/16/2017   PNA (pneumonia) 09/08/2015   Chest pain 07/12/2015   Personal history of adenomatouscolonic polyps 10/26/2011   Diabetes mellitus with nephropathy ([place]) 08/11/2011   Diabetic nephropathy ([place]) 01/26/2011   Obesity, unspecified 08/08/2008   GERD 08/08/2008   Essential hypertension 04/06/1999    Past Surgical History:  Procedure Laterality Date   AV FISTULA PLACEMENT  08/2010   Left radiocephalic AVF   AV FISTULA PLACEMENT Right 05/07/2014   Procedure: ARTERIOVENOUS  (AV) FISTULA CREATION;  Surgeon: Elam Dutch, MD;  Location: [facility] OR;  Service: Vascular;  Laterality: Right;   AV FISTULA PLACEMENT Right 07/02/2014   Procedure: INSERTION OF ARTERIOVENOUS (AV) GORE-TEX GRAFT ARM;  Surgeon: Elam Dutch, MD;  Location: [place];  Service: Vascular;  Laterality: Right;   CARDIOVASCULAR STRESS TEST  06/25/2016   [place]   normal nuclear perfusion study w/ no ischemia/  normal LV function and wall motion , ef 51%   CESAREAN SECTION  03/22/2001   w/ Bilateral Tubal Ligation   COLONOSCOPY     COLONOSCOPY WITH ESOPHAGOGASTRODUODENOSCOPY (EGD)  10/19/2011   DILATION AND CURETTAGE OF UTERUS  05/12/2000   w/ suction for missed ab   DOBUTAMINE STRESS ECHO  06/04/2016    [facility]   normal stress echo w/ no chest pain or ischemia/  normal LV function and wall motion , stress ef 60-65%   ESOPHAGOGASTRODUODENOSCOPY  10/19/2011   Dr. Silvano Rusk   FEMUR IM NAIL Left child   removed   FOOT SURGERY Left 2014 approx.   HYSTEROSCOPY WITH NOVASURE N/A 08/19/2016   Procedure: HYSTEROSCOPY WITH NOVASURE;  Surgeon: Paula Compton, MD;  Location: [facility];  Service: Gynecology;  Laterality: N/A;   KIDNEY TRANSPLANT  09/18/2016   LAPAROSCOPIC CHOLECYSTECTOMY  1994   LEFT HEART CATHETERIZATION WITH CORONARY ANGIOGRAM N/A 06/27/2012   Procedure: LEFT HEART CATHETERIZATION WITH CORONARY ANGIOGRAM;  Surgeon: Larey Dresser, MD;  Location: [facility] CATH LAB;  Service: Cardiovascular;  Laterality: N/A; No ostructive CAD, normal LVF, ef 55-60% (mild LAD luminal irregularities, moderate dRCA diffuse disease)   LIGATION GORETEX FISTULA  01/04/11   Left AVF   LIGATION OF ARTERIOVENOUS  FISTULA Left 08/21/2014   Procedure: LIGATION OF ARTERIOVENOUS  FISTULA;  Surgeon: Algernon Huxley, MD;  Location: [facility] ORS;  Service: Vascular;  Laterality: Left;   LIGATION OF ARTERIOVENOUS  FISTULA Left 06/29/2017   Procedure: LIGATION OF ARTERIOVENOUS  FISTULA ( RADIOCEPHALIC );   Surgeon: Algernon Huxley, MD;  Location: [facility] ORS;  Service: Vascular;  Laterality: Left;   NEPHRECTOMY TRANSPLANTED ORGAN     PERIPHERAL VASCULAR CATHETERIZATION Left 08/08/2014   Procedure: Upper Extremity Angiography;  Surgeon: Algernon Huxley, MD;  Location: [place] CV LAB;  Service: Cardiovascular;  Laterality: Left;   PERIPHERAL VASCULAR CATHETERIZATION Left 08/08/2014   Procedure: Upper Extremity Intervention;  Surgeon: Algernon Huxley, MD;  Location: [place] CV LAB;  Service: Cardiovascular;  Laterality: Left;   PERIPHERAL VASCULAR CATHETERIZATION Left 03/08/2016   Procedure: A/V Fistulagram;  Surgeon: Algernon Huxley, MD;  Location: [place] CV LAB;  Service: Cardiovascular;  Laterality: Left;   POLYPECTOMY     THROMBECTOMY AND REVISION OF ARTERIOVENTOUS (AV) GORETEX  GRAFT Right 07/16/2014   Procedure: THROMBECTOMY  Right  arm  ARTERIOVENOUS  GORETEX  GRAFT;  Surgeon: Elam Dutch, MD;  Location: [facility] OR;  Service: Vascular;  Laterality: Right;   TRANSTHORACIC ECHOCARDIOGRAM  06/04/2016   mild concentric LVH, ef 55-60%,  mild TR,  borderline LAE,  trivial MR and PR     OB History    Gravida  3   Para  1   Term      Preterm  1   AB  1   Living  2     SAB  1   TAB      Ectopic      Multiple      Live Births  2            Home Medications    Prior to Admission medications   Medication Sig Start Date End Date Taking? Authorizing Provider  amitriptyline (ELAVIL) 10 MG tablet Take 10 mg by mouth at bedtime.   Yes [provider]  amLODipine (NORVASC) 5 MG tablet Take 10 mg by mouth daily.   Yes [provider]  buPROPion (WELLBUTRIN XL) 150 MG 24 hr tablet Take 1 tablet (150 mg total) by mouth daily after breakfast. For depression 11/26/17  Yes Nwoko, Herbert Pun I, NP  cetirizine (ZYRTEC) 10 MG tablet Take 10 mg by mouth 2 (two) times daily.   Yes [provider]  citalopram (CELEXA) 40 MG tablet Take 40 mg by mouth daily.   Yes  [provider]  cloNIDine (CATAPRES) 0.3 MG tablet Take 0.3 mg by mouth 3 (three) times daily.    Yes [provider]  furosemide (LASIX) 20 MG tablet Take 1 tablet (20 mg total) by mouth daily. For swellings Patient taking differently: Take 40 mg by mouth daily.  11/26/17  Yes Lindell Spar I, NP  gabapentin (NEURONTIN) 300 MG capsule Take 300 mg by mouth at bedtime as needed. pain   Yes [provider]  labetalol (NORMODYNE) 200 MG tablet Take 2 tablets (400 mg total) by mouth 3 (three) times daily. For high blood pressure 11/25/17  Yes Nwoko, Agnes I, NP  montelukast (SINGULAIR) 10 MG tablet Take 10 mg by mouth at bedtime.   Yes [provider]  mycophenolate (MYFORTIC) 360 MG TBEC EC tablet Take 2 tablets (720 mg total) by mouth 2 (two) times daily. For renal transplant 11/25/17  Yes Lindell Spar I, NP  omeprazole (PRILOSEC) 20 MG capsule Take 20 mg by mouth 2 (two) times daily before a meal.   Yes [provider]  pantoprazole (PROTONIX) 40 MG tablet Take 1 tablet (40 mg total) by mouth daily. For acid reflux 11/26/17  Yes Nwoko, Herbert Pun I, NP  simvastatin (ZOCOR) 10 MG tablet Take 1 tablet (10 mg total) by mouth at bedtime. For high cholesterol 11/25/17  Yes Nwoko, Herbert Pun I, NP  tacrolimus (PROGRAF) 1 MG capsule Take 4 mg by mouth 2 (two) times daily.    Yes [provider]  zolpidem (AMBIEN) 5 MG tablet Take 1 tablet (5 mg total) by mouth at bedtime as needed for sleep. 11/25/17  Yes Lindell Spar I, NP  albuterol (PROVENTIL HFA;VENTOLIN HFA) 108 (90 Base) MCG/ACT inhaler Inhale 2 puffs into the lungs every 6 (six) hours as needed for wheezing or shortness of breath. 11/25/17   Lindell Spar I, NP  ARIPiprazole (ABILIFY) 10 MG tablet Take 1 tablet (10 mg total) by mouth daily. For mood control Patient not taking: Reported on 01/25/2018 11/26/17   Lindell Spar I, NP  insulin aspart (NOVOLOG) 100 UNIT/ML injection Inject 10 Units into the skin daily with  breakfast. For diabetes management Patient taking differently: Inject 6-11 Units into the skin See admin instructions. Base dose is 6. BS greater  than 130 use sliding scale. 150-200 = 7 units; 201-250=8 units; 251-300=9 units; 301-350=10 units; 351-400=11 units and call the doctor 11/26/17   Lindell Spar I, NP  insulin aspart (NOVOLOG) 100 UNIT/ML injection Inject 14 Units into the skin 2 (two) times daily before lunch and supper. For diabetes management Patient not taking: Reported on 01/25/2018 11/25/17   Lindell Spar I, NP  insulin glargine (LANTUS) 100 UNIT/ML injection Inject 0.26 mLs (26 Units total) into the skin at bedtime. For diabetes management Patient not taking: Reported on 01/25/2018 11/25/17   Lindell Spar I, NP  Insulin Human (INSULIN PUMP) SOLN Inject 1 each into the skin See admin instructions.    [provider]  magnesium oxide (MAG-OX) 400 (241.3 Mg) MG tablet Take 1 tablet (400 mg total) by mouth daily at 12 noon. For low Magnesium Patient taking differently: Take 400 mg by mouth daily at 12 noon.  11/25/17   Lindell Spar I, NP  predniSONE (DELTASONE) 5 MG tablet Take 5 mg by mouth daily with breakfast.    [provider]  sodium bicarbonate 650 MG tablet Take 1,300 mg by mouth 2 (two) times daily.    [provider]  sulfamethoxazole-trimethoprim (BACTRIM,SEPTRA) 400-80 MG tablet Take 1 tablet by mouth 3 (three) times a week. For infection Patient taking differently: Take 1 tablet by mouth every Monday, Wednesday, and Friday. For infection 11/28/17   Lindell Spar I, NP    Family History Family History  Problem Relation Age of Onset   Heart disease Mother        heart attack at 44 y/o-infected valve   Kidney disease Mother        was a dialysis patient   Heart disease Brother    Kidney disease Maternal Grandmother        pre-dialysis   Diabetes Father    Hypertension Father    Colon cancer Paternal Grandmother    Colon polyps Paternal  Grandmother    Esophageal cancer Paternal Grandmother    Rectal cancer Paternal Grandmother    Stomach cancer Paternal Grandmother    Kidney failure Paternal Uncle    Kidney failure Paternal Aunt    Arthritis Brother    Diabetes Maternal Aunt    Hypertension Maternal Aunt    Diabetes Paternal Aunt    Heart disease Paternal Aunt    Hypertension Paternal Aunt     Social History Social History   Tobacco Use   Smoking status: Never Smoker   Smokeless tobacco: Never Used  Substance Use Topics   Alcohol use: No    Alcohol/week: 0.0 standard drinks   Drug use: No     Allergies   Ciprofloxacin; Fluocinolone; Hydromorphone; Pineapple; Strawberry extract; Phenytoin sodium extended; Tylenol [acetaminophen]; Adhesive [tape]; Chlorhexidine gluconate; Clindamycin; Oxycodone; and Shrimp [shellfish allergy]   Review of Systems Review of Systems  All other systems reviewed and are negative.    Physical Exam Updated Vital Signs BP (!) 160/105 (BP Location: Right Arm)    Pulse 95    Temp 98.1 F (36.7 C) (Oral)    Resp 14    Ht 5' 6.5" (1.689 m)    Wt 104.3 kg    SpO2 98%    BMI 36.57 kg/m   Physical Exam Vitals signs and nursing note reviewed.  Constitutional:      General: She is not in acute distress.    Appearance: She is well-developed. She is obese.  HENT:     Head: Normocephalic and atraumatic.  Eyes:     Pupils: Pupils are equal, round, and reactive to light.  Cardiovascular:     Rate and Rhythm: Normal rate and regular rhythm.     Heart sounds: Normal heart sounds. No murmur. No friction rub.  Pulmonary:     Effort: Pulmonary effort is normal.     Breath sounds: Normal breath sounds. No wheezing or rales.  Abdominal:     General: Bowel sounds are normal. There is no distension.     Palpations: Abdomen is soft.     Tenderness: There is no abdominal tenderness. There is no guarding or rebound.  Musculoskeletal: Normal range of motion.        General:  No tenderness.     Right lower leg: Edema present.     Left lower leg: Edema present.     Comments: Edema present in bilateral lower extremities up to the knee.  No weeping at this time or skin breakdown.  Skin:    General: Skin is warm and dry.     Findings: No rash.  Neurological:     General: No focal deficit present.     Mental Status: She is alert and oriented to person, place, and time. Mental status is at baseline.     Cranial Nerves: No cranial nerve deficit.  Psychiatric:        Mood and Affect: Mood normal.        Behavior: Behavior normal.        Thought Content: Thought content normal.      ED Treatments / Results  Labs (all labs ordered are listed, but only abnormal results are displayed) Labs Reviewed  CBC WITH DIFFERENTIAL/PLATELET - Abnormal; Notable for the following components:      Result Value   MCH 25.6 (*)    MCHC 29.8 (*)    All other components within normal limits  COMPREHENSIVE METABOLIC PANEL - Abnormal; Notable for the following components:   Glucose, Bld 186 (*)    BUN 22 (*)    Creatinine, Ser 1.02 (*)    All other components within normal limits  BRAIN NATRIURETIC PEPTIDE    EKG EKG Interpretation  Date/Time:  Sunday Aug 27 2018 13:18:47 EDT Ventricular Rate:  93 PR Interval:    QRS Duration: 79 QT Interval:  383 QTC Calculation: 477 R Axis:   3 Text Interpretation:  Sinus rhythm Consider right atrial enlargement LVH by voltage No significant change since last tracing Confirmed by ,  ([id number]) on 08/27/2018 1:21:21 PM   Radiology Dg Chest Port 1 View  Result Date: 08/27/2018 CLINICAL DATA:  Pt with swelling in both legs with fluid leakage in legs; nonsmoker; h/o asthma; small amount of SOB EXAM: PORTABLE CHEST 1 VIEW COMPARISON:  Chest x-ray dated 04/26/2018 FINDINGS: The heart size and mediastinal contours are within normal limits. Both lungs are clear. The visualized skeletal structures are unremarkable. IMPRESSION: No  active disease. Electronically Signed   By: Stan  Maynard M.D.   On: 08/27/2018 13:19    Procedures Procedures (including critical care time)  Medications Ordered in ED Medications - No data to display   Initial Impression / Assessment and Plan / ED Course  I have reviewed the triage vital signs and the nursing notes.  Pertinent labs & imaging results that were available during my care of the patient were reviewed by me and considered in my medical decision making (see chart for details).        42  year old female presenting today with  lower extremity edema and mild shortness of breath with exertion.  Patient is in no acute distress at this time and satting 98% on room air.  She is hypertensive here but states she just took her medications on the way up here.  She has not missed any of her antirejection medication.  She denies any infectious or urinary symptoms.  On exam patient is fluid overloaded with edema in bilateral lower extremities which she reports is much greater than baseline.  She has no weeping at this time but sounds like she was having weeping last night.  Concern for potential worsening of her known renal artery stenosis that already has a stent and worsening renal function.  Will recheck creatinine.  Patient is denying any chest pain.  EKG, BNP, CMP, CBC and chest x-ray pending  3:06 PM EKG, chest x-ray are without acute findings.  CBC within normal limits.  CMP with creatinine of 1.0 which is below her baseline of 1.4-1.7.  BNP within normal limits.  Spoke with Dr. Linward Foster with the transplant team and he wishes for patient to follow-up after Tuesday and at this time continue her 40 mg of Lasix a day we will also recommend using compression stockings.  Final Clinical Impressions(s) / ED Diagnoses   Final diagnoses:  Bilateral lower extremity edema    ED Discharge Orders    None       Blanchie Dessert, MD 08/27/18 1507

## 2018-08-27 NOTE — ED Triage Notes (Signed)
Pt reports bilateral leg swelling since yesterday. She states she noticed fluid coming from the calf area. She denies SOB, chest pain.

## 2019-01-31 DIAGNOSIS — G4733 Obstructive sleep apnea (adult) (pediatric): Secondary | ICD-10-CM | POA: Insufficient documentation

## 2019-04-02 DIAGNOSIS — R0683 Snoring: Secondary | ICD-10-CM | POA: Insufficient documentation

## 2021-10-19 ENCOUNTER — Other Ambulatory Visit: Payer: Self-pay | Admitting: Nephrology

## 2021-10-20 ENCOUNTER — Other Ambulatory Visit: Payer: Self-pay | Admitting: Nephrology

## 2021-10-20 DIAGNOSIS — Z940 Kidney transplant status: Secondary | ICD-10-CM

## 2021-10-30 ENCOUNTER — Inpatient Hospital Stay: Admission: RE | Admit: 2021-10-30 | Payer: Medicare (Managed Care) | Source: Ambulatory Visit

## 2021-11-05 ENCOUNTER — Other Ambulatory Visit: Payer: Medicare Other

## 2021-11-06 ENCOUNTER — Ambulatory Visit
Admission: RE | Admit: 2021-11-06 | Discharge: 2021-11-06 | Disposition: A | Discharge status: Home / Self Care | Payer: Medicare (Managed Care) | Source: Ambulatory Visit | Attending: Nephrology | Admitting: Nephrology

## 2021-11-06 DIAGNOSIS — Z940 Kidney transplant status: Secondary | ICD-10-CM

## 2022-01-03 DIAGNOSIS — T8619 Other complication of kidney transplant: Secondary | ICD-10-CM | POA: Insufficient documentation

## 2022-01-03 DIAGNOSIS — I701 Atherosclerosis of renal artery: Secondary | ICD-10-CM | POA: Insufficient documentation

## 2022-02-19 ENCOUNTER — Encounter: Payer: Self-pay | Admitting: Internal Medicine

## 2022-03-05 DIAGNOSIS — R87810 Cervical high risk human papillomavirus (HPV) DNA test positive: Secondary | ICD-10-CM | POA: Insufficient documentation

## 2022-03-05 DIAGNOSIS — N19 Unspecified kidney failure: Secondary | ICD-10-CM

## 2022-03-05 HISTORY — DX: Unspecified kidney failure: N19

## 2022-03-10 ENCOUNTER — Telehealth: Payer: Self-pay

## 2022-03-10 NOTE — Telephone Encounter (Signed)
Linda, Ellison is scheduled for a colonoscopy with Dr. Carlean Purl on 03/26/22. Her PV is on 03/16/22. Linda Ellison has an insulin pump and is going to need instructions from Dr. Amalia Greenhouse in [facility]. His fax number is [phone]. Thank you so much!   Linda Sheer, LPN

## 2022-03-10 NOTE — Telephone Encounter (Signed)
Patient was called to verify if she was still taking Plavix and on dialysis. Patient states she is no longer on Plavix and is not on kidney dialysis since she had a kidney transplant. Patient may proceed with colonoscopy at the [facility]. Patient was also ok'd by Jenny Reichmann.

## 2022-03-11 NOTE — Telephone Encounter (Signed)
Insulin pump letter faxed to Dr Amalia Greenhouse at fax # [phone]. Phone # [phone].

## 2022-03-17 NOTE — Telephone Encounter (Signed)
I spoke with Dr Serita Grit office and she couldn't tell if they have the insulin pump letter. It has been refaxed to them.

## 2022-03-18 ENCOUNTER — Other Ambulatory Visit: Payer: Self-pay

## 2022-03-18 DIAGNOSIS — Z86010 Personal history of colonic polyps: Secondary | ICD-10-CM

## 2022-03-18 NOTE — Progress Notes (Signed)
Pt missed previsit and wishes to r/s colon and pv for next month.  Pt verb understanding

## 2022-03-22 NOTE — Telephone Encounter (Signed)
They have sent another message to the Dr. However she said he's off until Jan 4th. She assures me that someone will get back in touch.

## 2022-03-22 NOTE — Telephone Encounter (Signed)
Received a fax from Dr. Providence Crosby Patel's office that they have spoken to the patient and instructed her in regards to her insulin pump.

## 2022-03-26 ENCOUNTER — Encounter: Payer: Medicare (Managed Care) | Admitting: Internal Medicine

## 2022-04-12 ENCOUNTER — Ambulatory Visit (AMBULATORY_SURGERY_CENTER): Payer: Medicare (Managed Care) | Admitting: *Deleted

## 2022-04-12 VITALS — Ht 66.0 in | Wt 220.0 lb

## 2022-04-12 DIAGNOSIS — Z86010 Personal history of colonic polyps: Secondary | ICD-10-CM

## 2022-04-12 MED ORDER — NA SULFATE-K SULFATE-MG SULF 17.5-3.13-1.6 GM/177ML PO SOLN: 1.0000 | Freq: Once | ORAL | 0 refills | Status: AC

## 2022-04-12 NOTE — Progress Notes (Signed)

## 2022-05-03 ENCOUNTER — Encounter: Payer: Self-pay | Admitting: Certified Registered Nurse Anesthetist

## 2022-05-10 ENCOUNTER — Encounter: Payer: Self-pay | Admitting: Internal Medicine

## 2022-05-10 ENCOUNTER — Ambulatory Visit (AMBULATORY_SURGERY_CENTER): Payer: Medicare (Managed Care) | Admitting: Internal Medicine

## 2022-05-10 VITALS — BP 145/81 | HR 85 | Temp 95.0°F | Resp 15 | Ht 66.5 in | Wt 262.0 lb

## 2022-05-10 DIAGNOSIS — Z86010 Personal history of colonic polyps: Secondary | ICD-10-CM | POA: Diagnosis not present

## 2022-05-10 DIAGNOSIS — Z09 Encounter for follow-up examination after completed treatment for conditions other than malignant neoplasm: Secondary | ICD-10-CM

## 2022-05-10 MED ORDER — DEXTROSE 5 % IV SOLN: Freq: Once | INTRAVENOUS | Status: DC

## 2022-05-10 MED ORDER — SODIUM CHLORIDE 0.9 % IV SOLN: 500.0000 mL | INTRAVENOUS | Status: DC

## 2022-05-10 MED ORDER — DEXTROSE 50 % IV SOLN
12.5000 g | Freq: Once | INTRAVENOUS | Status: AC
  Administered 2022-05-10: 12.5 g via INTRAVENOUS

## 2022-05-10 NOTE — Progress Notes (Signed)
Rechecked CBG it was 108. Restarted IV D5W at [facility]. Will recheck CBG on arrival to PACU.

## 2022-05-10 NOTE — Patient Instructions (Addendum)
Normal exam - no polyps.  Next routine colonoscopy or other screening test in 10 years - 2034.  I appreciate the opportunity to care for you. Gatha Mayer, MD, FACG   YOU HAD AN ENDOSCOPIC PROCEDURE TODAY AT [facility]:   Refer to the procedure report that was given to you for any specific questions about what was found during the examination.  If the procedure report does not answer your questions, please call your gastroenterologist to clarify.  If you requested that your care partner not be given the details of your procedure findings, then the procedure report has been included in a sealed envelope for you to review at your convenience later.  YOU SHOULD EXPECT: Some feelings of bloating in the abdomen. Passage of more gas than usual.  Walking can help get rid of the air that was put into your GI tract during the procedure and reduce the bloating. If you had a lower endoscopy (such as a colonoscopy or flexible sigmoidoscopy) you may notice spotting of blood in your stool or on the toilet paper. If you underwent a bowel prep for your procedure, you may not have a normal bowel movement for a few days.  Please Note:  You might notice some irritation and congestion in your nose or some drainage.  This is from the oxygen used during your procedure.  There is no need for concern and it should clear up in a day or so.  SYMPTOMS TO REPORT IMMEDIATELY:  Following lower endoscopy (colonoscopy or flexible sigmoidoscopy):  Excessive amounts of blood in the stool  Significant tenderness or worsening of abdominal pains  Swelling of the abdomen that is new, acute  Fever of 100F or higher  For urgent or emergent issues, a gastroenterologist can be reached at any hour by calling [phone]. Do not use MyChart messaging for urgent concerns.    DIET:  We do recommend a small meal at first, but then you may proceed to your regular diet.  Drink plenty of fluids but you should avoid  alcoholic beverages for 24 hours.  ACTIVITY:  You should plan to take it easy for the rest of today and you should NOT DRIVE or use heavy machinery until tomorrow (because of the sedation medicines used during the test).    FOLLOW UP: Our staff will call the number listed on your records the next business day following your procedure.  We will call around 7:15- 8:00 am to check on you and address any questions or concerns that you may have regarding the information given to you following your procedure. If we do not reach you, we will leave a message.      SIGNATURES/CONFIDENTIALITY: You and/or your care partner have signed paperwork which will be entered into your electronic medical record.  These signatures attest to the fact that that the information above on your After Visit Summary has been reviewed and is understood.  Full responsibility of the confidentiality of this discharge information lies with you and/or your care-partner.

## 2022-05-10 NOTE — Progress Notes (Signed)
[facility] History and Physical   Primary Care Physician:  Virginia Rochester, [place]   Reason for Procedure:   Hx colon polyps  Plan:    colonoscopy     HPI: Linda Ellison is a 47 y.o. female w/ hx DM and kidney transplant here for surveillance exam. She was hypoglycemic upon arrival and is better after turning off insulin pump + glucose Tx  6 mm serrated adenoma July 2013 02/28/2017 no polyps - recall 5 yrs (not 10 as is immunosuppressed after kidney transplant) Past Medical History:  Diagnosis Date   Allergy    Anemia associated with chronic renal failure    Anxiety    Asthma    Atypical chest pain    long-standing -- normal cardio cath 06-27-2012 and normal nuclear stress test 06-25-2016   AV (arteriovenous fistula) ([place])    for dialysis-- currently located left radiocephalic   CAD (coronary artery disease) cardiologist-- dr Clydie Braun ([facility] in [place])   a. False positive stress echo 06/2012 at [facility] - cath with no obstructive CAD at [facility] (mild luminal irregularities in LAD, moderate diffuse disease in distal RCA).   Depression    Elevated lipids    ESRD on hemodialysis [facility]) NEPHROLOGIST-  DR MATTINGLY   started dialysis 01/29/11: [facility] on MWF   Gastroparesis    GERD (gastroesophageal reflux disease)    History of pneumonia    HCAP 04/ 2017   Hyperlipidemia    Hyperthyroidism    Menorrhagia    Other secondary hypertension    associated to diabetes -- followed by cardiologist ( dr Clydie Braun)     Peripheral neuropathy    PONV (postoperative nausea and vomiting)    Pre-transplant evaluation for kidney transplant    [facility]   Renal failure syndrome 03/05/2022   Secondary hyperparathyroidism of renal origin [facility])    Sleep apnea    Type 2 diabetes mellitus, with long-term current use of insulin ([place])    dx 1985    Past Surgical History:  Procedure Laterality Date    AV FISTULA PLACEMENT  08/2010   Left radiocephalic AVF   AV FISTULA PLACEMENT Right 05/07/2014   Procedure: ARTERIOVENOUS (AV) FISTULA CREATION;  Surgeon: Elam Dutch, MD;  Location: [place];  Service: Vascular;  Laterality: Right;   AV FISTULA PLACEMENT Right 07/02/2014   Procedure: INSERTION OF ARTERIOVENOUS (AV) GORE-TEX GRAFT ARM;  Surgeon: Elam Dutch, MD;  Location: [place];  Service: Vascular;  Laterality: Right;   CARDIOVASCULAR STRESS TEST  06/25/2016   [place]   normal nuclear perfusion study w/ no ischemia/  normal LV function and wall motion , ef 51%   CESAREAN SECTION  03/22/2001   w/ Bilateral Tubal Ligation   COLONOSCOPY     COLONOSCOPY WITH ESOPHAGOGASTRODUODENOSCOPY (EGD)  10/19/2011   DILATION AND CURETTAGE OF UTERUS  05/12/2000   w/ suction for missed ab   DOBUTAMINE STRESS ECHO  06/04/2016    [facility]   normal stress echo w/ no chest pain or ischemia/  normal LV function and wall motion , stress ef 60-65%   ESOPHAGOGASTRODUODENOSCOPY  10/19/2011   Dr. Silvano Rusk   FEMUR IM NAIL Left child   removed   FOOT SURGERY Left 2014 approx.   HYSTEROSCOPY WITH NOVASURE N/A 08/19/2016   Procedure: HYSTEROSCOPY WITH NOVASURE;  Surgeon: Paula Compton, MD;  Location: [facility];  Service: Gynecology;  Laterality: N/A;   KIDNEY TRANSPLANT  09/18/2016  LAPAROSCOPIC CHOLECYSTECTOMY  1994   LEFT HEART CATHETERIZATION WITH CORONARY ANGIOGRAM N/A 06/27/2012   Procedure: LEFT HEART CATHETERIZATION WITH CORONARY ANGIOGRAM;  Surgeon: Larey Dresser, MD;  Location: [facility] CATH LAB;  Service: Cardiovascular;  Laterality: N/A; No ostructive CAD, normal LVF, ef 55-60% (mild LAD luminal irregularities, moderate dRCA diffuse disease)   LIGATION GORETEX FISTULA  01/04/11   Left AVF   LIGATION OF ARTERIOVENOUS  FISTULA Left 08/21/2014   Procedure: LIGATION OF ARTERIOVENOUS  FISTULA;  Surgeon: Algernon Huxley, MD;  Location: [facility] ORS;  Service: Vascular;  Laterality: Left;   LIGATION OF  ARTERIOVENOUS  FISTULA Left 06/29/2017   Procedure: LIGATION OF ARTERIOVENOUS  FISTULA ( RADIOCEPHALIC );  Surgeon: Algernon Huxley, MD;  Location: [facility] ORS;  Service: Vascular;  Laterality: Left;   NEPHRECTOMY TRANSPLANTED ORGAN     PERIPHERAL VASCULAR CATHETERIZATION Left 08/08/2014   Procedure: Upper Extremity Angiography;  Surgeon: Algernon Huxley, MD;  Location: [place] CV LAB;  Service: Cardiovascular;  Laterality: Left;   PERIPHERAL VASCULAR CATHETERIZATION Left 08/08/2014   Procedure: Upper Extremity Intervention;  Surgeon: Algernon Huxley, MD;  Location: [place] CV LAB;  Service: Cardiovascular;  Laterality: Left;   PERIPHERAL VASCULAR CATHETERIZATION Left 03/08/2016   Procedure: A/V Fistulagram;  Surgeon: Algernon Huxley, MD;  Location: [place] CV LAB;  Service: Cardiovascular;  Laterality: Left;   POLYPECTOMY     THROMBECTOMY AND REVISION OF ARTERIOVENTOUS (AV) GORETEX  GRAFT Right 07/16/2014   Procedure: THROMBECTOMY  Right  arm  ARTERIOVENOUS  GORETEX  GRAFT;  Surgeon: Elam Dutch, MD;  Location: [facility];  Service: Vascular;  Laterality: Right;   TRANSTHORACIC ECHOCARDIOGRAM  06/04/2016   mild concentric LVH, ef 55-60%,  mild TR,  borderline LAE,  trivial MR and PR    Prior to Admission medications   Medication Sig Start Date End Date Taking? Authorizing Provider  amLODipine (NORVASC) 10 MG tablet Take 1 tablet by mouth daily. 02/11/22  Yes [provider]  ARIPiprazole (ABILIFY) 30 MG tablet Take 30 mg by mouth daily. 01/26/22  Yes [provider]  atorvastatin (LIPITOR) 10 MG tablet Take 10 mg by mouth daily. 02/08/22  Yes [provider]  bumetanide (BUMEX) 2 MG tablet Take 1 tablet by mouth 2 (two) times daily. 08/06/21  Yes [provider]  buPROPion (WELLBUTRIN XL) 150 MG 24 hr tablet Take 1 tablet (150 mg total) by mouth daily after breakfast. For depression 11/26/17  Yes Nwoko, Herbert Pun I, NP  buPROPion (WELLBUTRIN XL) 300 MG 24 hr tablet Take  300 mg by mouth daily.   Yes [provider]  cetirizine (ZYRTEC) 10 MG tablet Take 10 mg by mouth 2 (two) times daily.   Yes [provider]  citalopram (CELEXA) 40 MG tablet Take 40 mg by mouth daily.   Yes [provider]  cloNIDine (CATAPRES) 0.3 MG tablet Take 0.3 mg by mouth 3 (three) times daily.    Yes [provider]  Dulaglutide 4.5 MG/0.5ML SOPN Inject into the skin once a week. 12/09/21  Yes [provider]  gabapentin (NEURONTIN) 300 MG capsule Take 300 mg by mouth at bedtime as needed. pain   Yes [provider]  hydrOXYzine (ATARAX) 25 MG tablet Take 25 mg by mouth at bedtime.   Yes [provider]  insulin aspart (NOVOLOG) 100 UNIT/ML injection USE UP TO 100 UNITS ONCE DAILY IN INSULIN PUMP. DX CODE W54.62 03/10/22  Yes [provider]  labetalol (NORMODYNE)  200 MG tablet Take 2 tablets (400 mg total) by mouth 3 (three) times daily. For high blood pressure 11/25/17  Yes Nwoko, Agnes I, NP  montelukast (SINGULAIR) 10 MG tablet Take 10 mg by mouth at bedtime.   Yes [provider]  mycophenolate (MYFORTIC) 360 MG TBEC EC tablet Take 2 tablets (720 mg total) by mouth 2 (two) times daily. For renal transplant 11/25/17  Yes Lindell Spar I, NP  Oxycodone HCl 10 MG TABS PLEASE SEE ATTACHED FOR DETAILED DIRECTIONS   Yes [provider]  pantoprazole (PROTONIX) 40 MG tablet Take 1 tablet (40 mg total) by mouth daily. For acid reflux 11/26/17  Yes Nwoko, Herbert Pun I, NP  prazosin (MINIPRESS) 1 MG capsule Take 1 mg by mouth at bedtime.   Yes [provider]  predniSONE (DELTASONE) 5 MG tablet Take 5 mg by mouth daily with breakfast.   Yes [provider]  simvastatin (ZOCOR) 10 MG tablet Take 1 tablet (10 mg total) by mouth at bedtime. For high cholesterol 11/25/17  Yes Nwoko, Herbert Pun I, NP  sodium bicarbonate 650 MG tablet Take 1,300 mg by mouth 2 (two) times daily.   Yes [provider]   sulfamethoxazole-trimethoprim (BACTRIM,SEPTRA) 400-80 MG tablet Take 1 tablet by mouth 3 (three) times a week. For infection Patient taking differently: Take 1 tablet by mouth every Monday, Wednesday, and Friday. For infection 11/28/17  Yes Nwoko, Herbert Pun I, NP  tacrolimus (PROGRAF) 1 MG capsule Take 4 mg by mouth 2 (two) times daily.    Yes [provider]  zolpidem (AMBIEN) 5 MG tablet Take 1 tablet (5 mg total) by mouth at bedtime as needed for sleep. 11/25/17  Yes Lindell Spar I, NP  albuterol (PROVENTIL HFA;VENTOLIN HFA) 108 (90 Base) MCG/ACT inhaler Inhale 2 puffs into the lungs every 6 (six) hours as needed for wheezing or shortness of breath. 11/25/17   Lindell Spar I, NP  amitriptyline (ELAVIL) 10 MG tablet Take 10 mg by mouth at bedtime. Patient not taking: Reported on 05/10/2022    [provider]  cinacalcet (SENSIPAR) 30 MG tablet Take 30 mg by mouth daily. Patient not taking: Reported on 05/10/2022 03/10/22   [provider]  furosemide (LASIX) 20 MG tablet Take 1 tablet (20 mg total) by mouth daily. For swellings Patient not taking: Reported on 04/12/2022 11/26/17   Lindell Spar I, NP  insulin aspart (NOVOLOG) 100 UNIT/ML injection Inject 10 Units into the skin daily with breakfast. For diabetes management Patient not taking: Reported on 05/10/2022 11/26/17   Lindell Spar I, NP  insulin aspart (NOVOLOG) 100 UNIT/ML injection Inject 14 Units into the skin 2 (two) times daily before lunch and supper. For diabetes management Patient not taking: Reported on 05/10/2022 11/25/17   Lindell Spar I, NP  insulin glargine (LANTUS) 100 UNIT/ML injection Inject 0.26 mLs (26 Units total) into the skin at bedtime. For diabetes management Patient not taking: Reported on 01/25/2018 11/25/17   Lindell Spar I, NP  Insulin Human (INSULIN PUMP) SOLN Inject 1 each into the skin See admin instructions. Patient not taking: Reported on 04/12/2022    [provider]  magnesium oxide (MAG-OX)  400 (241.3 Mg) MG tablet Take 1 tablet (400 mg total) by mouth daily at 12 noon. For low Magnesium Patient not taking: Reported on 05/10/2022 11/25/17   Lindell Spar I, NP  naloxone [facility]) nasal spray 4 mg/0.1 mL  10/28/21   [provider]  nystatin (MYCOSTATIN) 100000 UNIT/ML suspension Take by mouth.  11/02/21   [provider]  phentermine 15 MG capsule Take by mouth. Patient not taking: Reported on 04/12/2022 03/13/19   [provider]    Current Outpatient Medications  Medication Sig Dispense Refill   amLODipine (NORVASC) 10 MG tablet Take 1 tablet by mouth daily.     ARIPiprazole (ABILIFY) 30 MG tablet Take 30 mg by mouth daily.     atorvastatin (LIPITOR) 10 MG tablet Take 10 mg by mouth daily.     bumetanide (BUMEX) 2 MG tablet Take 1 tablet by mouth 2 (two) times daily.     buPROPion (WELLBUTRIN XL) 150 MG 24 hr tablet Take 1 tablet (150 mg total) by mouth daily after breakfast. For depression 30 tablet 0   buPROPion (WELLBUTRIN XL) 300 MG 24 hr tablet Take 300 mg by mouth daily.     cetirizine (ZYRTEC) 10 MG tablet Take 10 mg by mouth 2 (two) times daily.     citalopram (CELEXA) 40 MG tablet Take 40 mg by mouth daily.     cloNIDine (CATAPRES) 0.3 MG tablet Take 0.3 mg by mouth 3 (three) times daily.      Dulaglutide 4.5 MG/0.5ML SOPN Inject into the skin once a week.     gabapentin (NEURONTIN) 300 MG capsule Take 300 mg by mouth at bedtime as needed. pain     hydrOXYzine (ATARAX) 25 MG tablet Take 25 mg by mouth at bedtime.     insulin aspart (NOVOLOG) 100 UNIT/ML injection USE UP TO 100 UNITS ONCE DAILY IN INSULIN PUMP. DX CODE E11.42     labetalol (NORMODYNE) 200 MG tablet Take 2 tablets (400 mg total) by mouth 3 (three) times daily. For high blood pressure     montelukast (SINGULAIR) 10 MG tablet Take 10 mg by mouth at bedtime.     mycophenolate (MYFORTIC) 360 MG TBEC EC tablet Take 2 tablets (720 mg total) by mouth 2 (two) times daily. For renal transplant      Oxycodone HCl 10 MG TABS PLEASE SEE ATTACHED FOR DETAILED DIRECTIONS     pantoprazole (PROTONIX) 40 MG tablet Take 1 tablet (40 mg total) by mouth daily. For acid reflux 20 tablet 0   prazosin (MINIPRESS) 1 MG capsule Take 1 mg by mouth at bedtime.     predniSONE (DELTASONE) 5 MG tablet Take 5 mg by mouth daily with breakfast.     simvastatin (ZOCOR) 10 MG tablet Take 1 tablet (10 mg total) by mouth at bedtime. For high cholesterol  0   sodium bicarbonate 650 MG tablet Take 1,300 mg by mouth 2 (two) times daily.     sulfamethoxazole-trimethoprim (BACTRIM,SEPTRA) 400-80 MG tablet Take 1 tablet by mouth 3 (three) times a week. For infection (Patient taking differently: Take 1 tablet by mouth every Monday, Wednesday, and Friday. For infection)     tacrolimus (PROGRAF) 1 MG capsule Take 4 mg by mouth 2 (two) times daily.      zolpidem (AMBIEN) 5 MG tablet Take 1 tablet (5 mg total) by mouth at bedtime as needed for sleep. 7 tablet 0   albuterol (PROVENTIL HFA;VENTOLIN HFA) 108 (90 Base) MCG/ACT inhaler Inhale 2 puffs into the lungs every 6 (six) hours as needed for wheezing or shortness of breath.     amitriptyline (ELAVIL) 10 MG tablet Take 10 mg by mouth at bedtime. (Patient not taking: Reported on 05/10/2022)     cinacalcet (SENSIPAR) 30 MG tablet Take 30 mg by mouth daily. (Patient not taking: Reported on 05/10/2022)  furosemide (LASIX) 20 MG tablet Take 1 tablet (20 mg total) by mouth daily. For swellings (Patient not taking: Reported on 04/12/2022) 1 tablet 0   insulin aspart (NOVOLOG) 100 UNIT/ML injection Inject 10 Units into the skin daily with breakfast. For diabetes management (Patient not taking: Reported on 05/10/2022) 10 mL 11   insulin aspart (NOVOLOG) 100 UNIT/ML injection Inject 14 Units into the skin 2 (two) times daily before lunch and supper. For diabetes management (Patient not taking: Reported on 05/10/2022) 10 mL 11   insulin glargine (LANTUS) 100 UNIT/ML injection Inject 0.26 mLs (26  Units total) into the skin at bedtime. For diabetes management (Patient not taking: Reported on 01/25/2018) 10 mL 11   Insulin Human (INSULIN PUMP) SOLN Inject 1 each into the skin See admin instructions. (Patient not taking: Reported on 04/12/2022)     magnesium oxide (MAG-OX) 400 (241.3 Mg) MG tablet Take 1 tablet (400 mg total) by mouth daily at 12 noon. For low Magnesium (Patient not taking: Reported on 05/10/2022)     naloxone [facility]) nasal spray 4 mg/0.1 mL  (Patient not taking: Reported on 04/12/2022)     nystatin (MYCOSTATIN) 100000 UNIT/ML suspension Take by mouth.     phentermine 15 MG capsule Take by mouth. (Patient not taking: Reported on 04/12/2022)     No current facility-administered medications for this visit.    Allergies as of 05/10/2022 - Review Complete 05/10/2022  Allergen Reaction Noted   Ciprofloxacin Hives, Itching, and Nausea And Vomiting 06/09/2011   Fluocinolone Itching, Nausea And Vomiting, and Other (See Comments) 01/25/2014   Hydromorphone Hives 08/19/2016   Strawberry extract Itching and Other (See Comments) 12/21/2012   Hydromorphone hcl Hives 08/19/2016   Phenytoin sodium extended Itching 10/18/2016   Tylenol [acetaminophen] Itching 08/08/2015   Adhesive [tape] Itching and Rash 05/04/2014   Chlorhexidine gluconate Itching 07/02/2014   Clindamycin Diarrhea, Nausea And Vomiting, and Rash 07/01/2014   Oxycodone Nausea And Vomiting, Rash, and Other (See Comments) 05/09/2014   Shrimp [shellfish allergy] Rash 08/08/2014    Family History  Problem Relation Age of Onset   Heart disease Mother        heart attack at 50 y/o-infected valve   Kidney disease Mother        was a dialysis patient   Diabetes Father    Hypertension Father    Heart disease Brother    Arthritis Brother    Diabetes Maternal Aunt    Hypertension Maternal Aunt    Kidney failure Paternal Aunt    Diabetes Paternal Aunt    Heart disease Paternal Aunt    Hypertension Paternal Aunt    Kidney  failure Paternal Uncle    Kidney disease Maternal Grandmother        pre-dialysis   Colon cancer Neg Hx    Colon polyps Neg Hx    Crohn's disease Neg Hx    Esophageal cancer Neg Hx    Rectal cancer Neg Hx    Stomach cancer Neg Hx    Ulcerative colitis Neg Hx     Social History   Socioeconomic History   Marital status: Single    Spouse name: Not on file   Number of children: 2   Years of education: Not on file   Highest education level: Not on file  Occupational History   Occupation: Unemployed  Tobacco Use   Smoking status: Never   Smokeless tobacco: Never  Vaping Use   Vaping Use: Never used  Substance and Sexual Activity  Alcohol use: No    Alcohol/week: 0.0 standard drinks of alcohol   Drug use: No   Sexual activity: Not on file  Other Topics Concern   Not on file  Social History Narrative   Disabled 2/2 to dialysis   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  Physical Exam: Vital signs BP (!) 172/100   Pulse 86   Temp (!) 95 F (35 C) (Temporal)   Ht 5' 6.5" (1.689 m)   Wt 262 lb (118.8 kg)   SpO2 97%   BMI 41.65 kg/m   General:   Alert,  Well-developed, well-nourished, pleasant and cooperative in NAD Lungs:  Clear throughout to auscultation.   Heart:  Regular rate and rhythm; no murmurs, clicks, rubs,  or gallops. Abdomen:  Soft, nontender and nondistended. Normal bowel sounds.   Neuro/Psych:  Alert and cooperative. Normal mood and affect. A and O x 3   '@Adriona Kaney'$  Simonne Maffucci, MD, [facility] [phone] (pager) 05/10/2022  11:49 AM@

## 2022-05-10 NOTE — Op Note (Signed)
[place] Patient Name: Linda Ellison Procedure Date: 05/10/2022 11:51 AM MRN: [id number] Endoscopist: Gatha Mayer , MD, [id number] Age: 47 Referring MD:  Date of Birth: [birth year] Gender: Female Account #: [id number] Procedure:                Colonoscopy Indications:              High risk colon cancer surveillance: Personal                            history of sessile serrated colon polyp (less than                            10 mm in size) with no dysplasia, Last colonoscopy:                            2018 Medicines:                Monitored Anesthesia Care Procedure:                Pre-Anesthesia Assessment:                           - Prior to the procedure, a History and Physical                            was performed, and patient medications and                            allergies were reviewed. The patient's tolerance of                            previous anesthesia was also reviewed. The risks                            and benefits of the procedure and the sedation                            options and risks were discussed with the patient.                            All questions were answered, and informed consent                            was obtained. Prior Anticoagulants: The patient has                            taken no anticoagulant or antiplatelet agents. ASA                            Grade Assessment: III - A patient with severe                            systemic disease. After reviewing the risks and  benefits, the patient was deemed in satisfactory                            condition to undergo the procedure.                           After obtaining informed consent, the colonoscope                            was passed under direct vision. Throughout the                            procedure, the patient's blood pressure, pulse, and                            oxygen saturations were monitored continuously. The                             Olympus CF-HQ190L [id number]) Colonoscope was                            introduced through the anus and advanced to the the                            cecum, identified by appendiceal orifice and                            ileocecal valve. The colonoscopy was performed                            without difficulty. The patient tolerated the                            procedure well. The quality of the bowel                            preparation was adequate. The bowel preparation                            used was Miralax via extended prep with split dose                            instruction. The bowel preparation used was SUPREP                            via extended prep with split dose instruction. The                            ileocecal valve, appendiceal orifice, and rectum                            were photographed. Scope In: 11:59:47 AM Scope Out: 12:23:11 PM Scope Withdrawal Time: 0 hours 20 minutes 27 seconds  Total Procedure Duration: 0 hours 23 minutes 24  seconds  Findings:                 The perianal and digital rectal examinations were                            normal.                           The entire examined colon appeared normal on direct                            and retroflexion views. Complications:            No immediate complications. Estimated Blood Loss:     Estimated blood loss: none. Impression:               - The entire examined colon is normal on direct and                            retroflexion views.                           - No specimens collected.                           - Personal history of colonic polyps. 6 mm sessile                            serrated polyp 2013, none in 2018 Recommendation:           - Patient has a contact number available for                            emergencies. The signs and symptoms of potential                            delayed complications were discussed with the                             patient. Return to normal activities tomorrow.                            Written discharge instructions were provided to the                            patient.                           - Resume previous diet.                           - Continue present medications.                           - Repeat colonoscopy in 10 years. Gatha Mayer, MD 05/10/2022 12:29:44 PM This report has been signed electronically.

## 2022-05-10 NOTE — Progress Notes (Signed)
Report given to PACU, vss 

## 2022-05-10 NOTE — Progress Notes (Signed)
Arrives to check in sweaty and shaky, c/o low bg, cbg is 47, instructed pt to turn off insulin pump. Transport pt to get IV and vs, other vss, IV dextrose half amp given at 1104 AM.   CBG 174 at 1110- dextrose turned off and NS running open

## 2022-05-11 ENCOUNTER — Telehealth: Payer: Self-pay

## 2022-05-11 NOTE — Telephone Encounter (Signed)
  Follow up Call-     05/10/2022   10:50 AM  Call back number  Post procedure Call Back phone  # [phone]  Permission to leave phone message Yes     Patient questions:  Do you have a fever, pain , or abdominal swelling? No. Pain Score  0 *  Have you tolerated food without any problems? Yes.    Have you been able to return to your normal activities? Yes.    Do you have any questions about your discharge instructions: Diet   No. Medications  No. Follow up visit  No.  Do you have questions or concerns about your Care? No.  Actions: * If pain score is 4 or above: No action needed, pain <4.

## 2022-07-14 ENCOUNTER — Other Ambulatory Visit: Payer: Self-pay | Admitting: Nephrology

## 2022-07-14 DIAGNOSIS — Z940 Kidney transplant status: Secondary | ICD-10-CM

## 2022-08-26 ENCOUNTER — Inpatient Hospital Stay (HOSPITAL_COMMUNITY): Payer: Medicare (Managed Care)

## 2022-08-26 ENCOUNTER — Other Ambulatory Visit: Payer: Self-pay

## 2022-08-26 ENCOUNTER — Inpatient Hospital Stay (HOSPITAL_COMMUNITY)
Admission: EM | Admit: 2022-08-26 | Discharge: 2022-08-28 | DRG: 391 | Disposition: A | Discharge status: Home / Self Care | Payer: Medicare (Managed Care) | Attending: Internal Medicine | Admitting: Internal Medicine

## 2022-08-26 ENCOUNTER — Encounter (HOSPITAL_COMMUNITY): Payer: Self-pay | Admitting: Emergency Medicine

## 2022-08-26 DIAGNOSIS — E1121 Type 2 diabetes mellitus with diabetic nephropathy: Secondary | ICD-10-CM | POA: Diagnosis present

## 2022-08-26 DIAGNOSIS — K219 Gastro-esophageal reflux disease without esophagitis: Secondary | ICD-10-CM | POA: Diagnosis present

## 2022-08-26 DIAGNOSIS — A09 Infectious gastroenteritis and colitis, unspecified: Secondary | ICD-10-CM | POA: Diagnosis present

## 2022-08-26 DIAGNOSIS — E039 Hypothyroidism, unspecified: Secondary | ICD-10-CM | POA: Diagnosis present

## 2022-08-26 DIAGNOSIS — F333 Major depressive disorder, recurrent, severe with psychotic symptoms: Secondary | ICD-10-CM | POA: Diagnosis present

## 2022-08-26 DIAGNOSIS — E7800 Pure hypercholesterolemia, unspecified: Secondary | ICD-10-CM | POA: Diagnosis present

## 2022-08-26 DIAGNOSIS — Z91013 Allergy to seafood: Secondary | ICD-10-CM

## 2022-08-26 DIAGNOSIS — E860 Dehydration: Secondary | ICD-10-CM | POA: Diagnosis present

## 2022-08-26 DIAGNOSIS — E1143 Type 2 diabetes mellitus with diabetic autonomic (poly)neuropathy: Secondary | ICD-10-CM | POA: Diagnosis present

## 2022-08-26 DIAGNOSIS — Z940 Kidney transplant status: Secondary | ICD-10-CM

## 2022-08-26 DIAGNOSIS — Z992 Dependence on renal dialysis: Secondary | ICD-10-CM

## 2022-08-26 DIAGNOSIS — E876 Hypokalemia: Secondary | ICD-10-CM | POA: Diagnosis present

## 2022-08-26 DIAGNOSIS — J4489 Other specified chronic obstructive pulmonary disease: Secondary | ICD-10-CM | POA: Diagnosis present

## 2022-08-26 DIAGNOSIS — D631 Anemia in chronic kidney disease: Secondary | ICD-10-CM | POA: Diagnosis present

## 2022-08-26 DIAGNOSIS — R9431 Abnormal electrocardiogram [ECG] [EKG]: Secondary | ICD-10-CM | POA: Diagnosis not present

## 2022-08-26 DIAGNOSIS — E1142 Type 2 diabetes mellitus with diabetic polyneuropathy: Secondary | ICD-10-CM | POA: Diagnosis present

## 2022-08-26 DIAGNOSIS — Z833 Family history of diabetes mellitus: Secondary | ICD-10-CM

## 2022-08-26 DIAGNOSIS — I120 Hypertensive chronic kidney disease with stage 5 chronic kidney disease or end stage renal disease: Secondary | ICD-10-CM | POA: Diagnosis present

## 2022-08-26 DIAGNOSIS — K3184 Gastroparesis: Secondary | ICD-10-CM | POA: Diagnosis present

## 2022-08-26 DIAGNOSIS — Z6838 Body mass index (BMI) 38.0-38.9, adult: Secondary | ICD-10-CM

## 2022-08-26 DIAGNOSIS — G4733 Obstructive sleep apnea (adult) (pediatric): Secondary | ICD-10-CM | POA: Diagnosis present

## 2022-08-26 DIAGNOSIS — F419 Anxiety disorder, unspecified: Secondary | ICD-10-CM | POA: Diagnosis present

## 2022-08-26 DIAGNOSIS — T8610 Unspecified complication of kidney transplant: Secondary | ICD-10-CM | POA: Diagnosis present

## 2022-08-26 DIAGNOSIS — Z8249 Family history of ischemic heart disease and other diseases of the circulatory system: Secondary | ICD-10-CM

## 2022-08-26 DIAGNOSIS — Z8701 Personal history of pneumonia (recurrent): Secondary | ICD-10-CM

## 2022-08-26 DIAGNOSIS — Z79621 Long term (current) use of calcineurin inhibitor: Secondary | ICD-10-CM

## 2022-08-26 DIAGNOSIS — Z886 Allergy status to analgesic agent status: Secondary | ICD-10-CM

## 2022-08-26 DIAGNOSIS — I2510 Atherosclerotic heart disease of native coronary artery without angina pectoris: Secondary | ICD-10-CM | POA: Diagnosis present

## 2022-08-26 DIAGNOSIS — E1122 Type 2 diabetes mellitus with diabetic chronic kidney disease: Secondary | ICD-10-CM | POA: Diagnosis present

## 2022-08-26 DIAGNOSIS — R112 Nausea with vomiting, unspecified: Secondary | ICD-10-CM | POA: Diagnosis not present

## 2022-08-26 DIAGNOSIS — Z79899 Other long term (current) drug therapy: Secondary | ICD-10-CM

## 2022-08-26 DIAGNOSIS — E1165 Type 2 diabetes mellitus with hyperglycemia: Secondary | ICD-10-CM | POA: Diagnosis present

## 2022-08-26 DIAGNOSIS — Z7985 Long-term (current) use of injectable non-insulin antidiabetic drugs: Secondary | ICD-10-CM

## 2022-08-26 DIAGNOSIS — Z885 Allergy status to narcotic agent status: Secondary | ICD-10-CM

## 2022-08-26 DIAGNOSIS — Z7952 Long term (current) use of systemic steroids: Secondary | ICD-10-CM

## 2022-08-26 DIAGNOSIS — R197 Diarrhea, unspecified: Secondary | ICD-10-CM | POA: Diagnosis not present

## 2022-08-26 DIAGNOSIS — Z794 Long term (current) use of insulin: Secondary | ICD-10-CM | POA: Diagnosis not present

## 2022-08-26 DIAGNOSIS — N186 End stage renal disease: Secondary | ICD-10-CM | POA: Diagnosis present

## 2022-08-26 DIAGNOSIS — E109 Type 1 diabetes mellitus without complications: Secondary | ICD-10-CM | POA: Diagnosis present

## 2022-08-26 DIAGNOSIS — I10 Essential (primary) hypertension: Secondary | ICD-10-CM | POA: Diagnosis present

## 2022-08-26 DIAGNOSIS — Z9049 Acquired absence of other specified parts of digestive tract: Secondary | ICD-10-CM

## 2022-08-26 DIAGNOSIS — Z881 Allergy status to other antibiotic agents status: Secondary | ICD-10-CM

## 2022-08-26 DIAGNOSIS — E43 Unspecified severe protein-calorie malnutrition: Secondary | ICD-10-CM | POA: Insufficient documentation

## 2022-08-26 DIAGNOSIS — N2581 Secondary hyperparathyroidism of renal origin: Secondary | ICD-10-CM | POA: Diagnosis present

## 2022-08-26 DIAGNOSIS — Z91048 Other nonmedicinal substance allergy status: Secondary | ICD-10-CM

## 2022-08-26 DIAGNOSIS — Z9102 Food additives allergy status: Secondary | ICD-10-CM

## 2022-08-26 DIAGNOSIS — N179 Acute kidney failure, unspecified: Secondary | ICD-10-CM | POA: Diagnosis present

## 2022-08-26 DIAGNOSIS — Z841 Family history of disorders of kidney and ureter: Secondary | ICD-10-CM

## 2022-08-26 LAB — COMPREHENSIVE METABOLIC PANEL
ALT: 23 U/L (ref 0–44)
AST: 21 U/L (ref 15–41)
Albumin: 4 g/dL (ref 3.5–5.0)
Alkaline Phosphatase: 104 U/L (ref 38–126)
Anion gap: 19 — ABNORMAL HIGH (ref 5–15)
BUN: 62 mg/dL — ABNORMAL HIGH (ref 6–20)
CO2: 29 mmol/L (ref 22–32)
Calcium: 7.7 mg/dL — ABNORMAL LOW (ref 8.9–10.3)
Chloride: 88 mmol/L — ABNORMAL LOW (ref 98–111)
Creatinine, Ser: 3.46 mg/dL — ABNORMAL HIGH (ref 0.44–1.00)
GFR, Estimated: 16 mL/min — ABNORMAL LOW (ref >60)
Glucose, Bld: 83 mg/dL (ref 70–99)
Potassium: 2.3 mmol/L — CL (ref 3.5–5.1)
Sodium: 136 mmol/L (ref 135–145)
Total Bilirubin: 0.8 mg/dL (ref 0.3–1.2)
Total Protein: 7.9 g/dL (ref 6.5–8.1)

## 2022-08-26 LAB — URINALYSIS, ROUTINE W REFLEX MICROSCOPIC
Bilirubin Urine: NEGATIVE
Glucose, UA: NEGATIVE mg/dL
Hgb urine dipstick: NEGATIVE
Ketones, ur: NEGATIVE mg/dL
Nitrite: NEGATIVE
Protein, ur: NEGATIVE mg/dL
Specific Gravity, Urine: 1.013 (ref 1.005–1.030)
pH: 6 (ref 5.0–8.0)

## 2022-08-26 LAB — CBC WITH DIFFERENTIAL/PLATELET
Abs Immature Granulocytes: 0.05 10*3/uL (ref 0.00–0.07)
Basophils Absolute: 0 10*3/uL (ref 0.0–0.1)
Basophils Relative: 0 %
Eosinophils Absolute: 0.1 10*3/uL (ref 0.0–0.5)
Eosinophils Relative: 1 %
HCT: 39.4 % (ref 36.0–46.0)
Hemoglobin: 12.5 g/dL (ref 12.0–15.0)
Immature Granulocytes: 1 %
Lymphocytes Relative: 15 %
Lymphs Abs: 1.4 10*3/uL (ref 0.7–4.0)
MCH: 25.4 pg — ABNORMAL LOW (ref 26.0–34.0)
MCHC: 31.7 g/dL (ref 30.0–36.0)
MCV: 79.9 fL — ABNORMAL LOW (ref 80.0–100.0)
Monocytes Absolute: 0.9 10*3/uL (ref 0.1–1.0)
Monocytes Relative: 10 %
Neutro Abs: 6.8 10*3/uL (ref 1.7–7.7)
Neutrophils Relative %: 73 %
Platelets: 319 10*3/uL (ref 150–400)
RBC: 4.93 MIL/uL (ref 3.87–5.11)
RDW: 12.4 % (ref 11.5–15.5)
WBC: 9.3 10*3/uL (ref 4.0–10.5)
nRBC: 0 % (ref 0.0–0.2)

## 2022-08-26 LAB — CBG MONITORING, ED: Glucose-Capillary: 71 mg/dL (ref 70–99)

## 2022-08-26 LAB — I-STAT BETA HCG BLOOD, ED (MC, WL, AP ONLY): I-stat hCG, quantitative: <5 m[IU]/mL (ref <5)

## 2022-08-26 LAB — LIPASE, BLOOD: Lipase: 21 U/L (ref 11–51)

## 2022-08-26 LAB — MAGNESIUM: Magnesium: 1.2 mg/dL — ABNORMAL LOW (ref 1.7–2.4)

## 2022-08-26 LAB — LACTIC ACID, PLASMA: Lactic Acid, Venous: 1.1 mmol/L (ref 0.5–1.9)

## 2022-08-26 MED ORDER — TACROLIMUS 1 MG PO CAPS
5.0000 mg | ORAL_CAPSULE | Freq: Every day | ORAL | Status: DC
  Administered 2022-08-27 – 2022-08-28 (×2): 5 mg via ORAL
  Filled 2022-08-26 (×2): qty 5

## 2022-08-26 MED ORDER — POTASSIUM CHLORIDE 10 MEQ/100ML IV SOLN
10.0000 meq | INTRAVENOUS | Status: AC
  Administered 2022-08-26 – 2022-08-27 (×2): 10 meq via INTRAVENOUS
  Filled 2022-08-26 (×2): qty 100

## 2022-08-26 MED ORDER — LACTATED RINGERS IV BOLUS
2000.0000 mL | Freq: Once | INTRAVENOUS | Status: AC
  Administered 2022-08-26: 2000 mL via INTRAVENOUS

## 2022-08-26 MED ORDER — MAGNESIUM SULFATE 2 GM/50ML IV SOLN
2.0000 g | Freq: Once | INTRAVENOUS | Status: AC
  Administered 2022-08-26: 2 g via INTRAVENOUS
  Filled 2022-08-26: qty 50

## 2022-08-26 MED ORDER — INSULIN PUMP
SUBCUTANEOUS | Status: DC
  Filled 2022-08-26: qty 1

## 2022-08-26 NOTE — Assessment & Plan Note (Signed)
Allow permissive hypertension for tonight given dehydration

## 2022-08-26 NOTE — H&P (Signed)
Linda Ellison WGN:[id number] DOB: 14-Aug-1975 DOA: 08/26/2022     PCP: Daylene Katayama, PA   Outpatient Specialists:     NEphrology:  Dr. Thedore Mins   GI  Dr. Leone Payor [facility], [facility])    Patient arrived to ER on 08/26/22 at 1937 Referred by Attending Gwyneth Sprout, MD   Patient coming from:    home Lives  With family    Chief Complaint:   Chief Complaint  Patient presents with   Abnormal Lab    HPI: Linda Ellison is a 47 y.o. female with medical history significant of hx of Kidney Transplant, Dm2, GERD, HTN,  asthma    Presented with   nausea/vomiting diarrhea Patient comes in with nausea vomiting diarrhea for past 1 week noted at primary care office that potassium was down to 2.6 has been feeling lightheaded weak generally and abdominal cramping.  Also some chills and fevers at home.  No sick contacts patient is status post kidney transplant in 2018 No blood in stool Last week patient significant other was hospitalized for Salmonella and Campylobacter after eating at cookout the patient ate the same thing Patient is states that eating and drinking makes more abdominal pain and she has been limited. She is chronically on Bactrim 3 times a week Patient is also on Myfortic but that is been chronic And prednisone 5 mg a day as well as Prograf 4 mg twice a    Reports fever and chills at home Denies significant ETOH intake   Does not smoke   No results found for: "SARSCOV2NAA"      Regarding pertinent Chronic problems:     Hyperlipidemia -  on statins Zocor (simvastatin)  Lipid Panel     Component Value Date/Time   CHOL 120 06/27/2012 0340   TRIG 85 06/27/2012 0340   TRIG 143 12/09/2008 0000   HDL 49 06/27/2012 0340   CHOLHDL 2.4 06/27/2012 0340   VLDL 17 06/27/2012 0340   LDLCALC 54 06/27/2012 0340   LDLDIRECT 170.4 08/08/2008 0921     HTN on Norvasc clonidine Lasix       DM 2 -  Lab Results  Component Value Date   HGBA1C 9.9 (H) 11/21/2017   on insulin  pump      obesity-   BMI Readings from Last 1 Encounters:  08/26/22 38.95 kg/m       Asthma -well   controlled on home inhalers/ nebs           Sp renal trNSPLANT  Lab Results  Component Value Date   CREATININE 3.46 (H) 08/26/2022   CREATININE 1.02 (H) 08/27/2018   CREATININE 0.91 04/26/2018    While in ER:     Noted to be hypokalemic hypomagnesemic and also evidence of AKI with creatinine now up to 3.4 QTc prolongation on EKG    Lab Orders         Gastrointestinal Panel by PCR , Stool         C Difficile Quick Screen w PCR reflex         Urinalysis, Routine w reflex microscopic -Urine, Clean Catch         Lipase, blood         Comprehensive metabolic panel         CBC with Differential         Magnesium         Lactic acid, plasma         I-Stat beta hCG blood, ED  CBG monitoring, ED       CTabd/pelvis - ordered    Following Medications were ordered in ER: Medications  lactated ringers bolus 2,000 mL (has no administration in time range)  potassium chloride 10 mEq in 100 mL IVPB (has no administration in time range)    _______  ED Triage Vitals  Enc Vitals Group     BP 08/26/22 1948 121/83     Pulse Rate 08/26/22 1948 84     Resp 08/26/22 1948 18     Temp 08/26/22 1948 97.8 F (36.6 C)     Temp Source 08/26/22 1948 Oral     SpO2 08/26/22 1948 97 %     Weight 08/26/22 1950 245 lb (111.1 kg)     Height 08/26/22 1950 5' 6.5" (1.689 m)     Head Circumference --      Peak Flow --      Pain Score 08/26/22 1950 7     Pain Loc --      Pain Edu? --      Excl. in GC? --   TMAX(24)@     _________________________________________ Significant initial  Findings: Abnormal Labs Reviewed  URINALYSIS, ROUTINE W REFLEX MICROSCOPIC - Abnormal; Notable for the following components:      Result Value   APPearance HAZY (*)    Leukocytes,Ua TRACE (*)    Bacteria, UA RARE (*)    All other components within normal limits  COMPREHENSIVE METABOLIC PANEL -  Abnormal; Notable for the following components:   Potassium 2.3 (*)    Chloride 88 (*)    BUN 62 (*)    Creatinine, Ser 3.46 (*)    Calcium 7.7 (*)    GFR, Estimated 16 (*)    Anion gap 19 (*)    All other components within normal limits  CBC WITH DIFFERENTIAL/PLATELET - Abnormal; Notable for the following components:   MCV 79.9 (*)    MCH 25.4 (*)    All other components within normal limits  MAGNESIUM - Abnormal; Notable for the following components:   Magnesium 1.2 (*)    All other components within normal limits       No results for input(s): "CKTOTAL", "CKMB", "TROPONINIHS", "RELINDX" in the last 72 hours.   ECG: Ordered Personally reviewed and interpreted by me showing: HR : 85 Rhythm: Long QTc Normal sinus rhythm Right atrial enlargement Nonspecific ST abnormality new Prolonged QT QTC 606  BNP (last 3 results) No results for input(s): "BNP" in the last 8760 hours.   COVID-19 Labs  No results for input(s): "DDIMER", "FERRITIN", "LDH", "CRP" in the last 72 hours.  No results found for: "SARSCOV2NAA"      The recent clinical data is shown below. Vitals:   08/26/22 2030 08/26/22 2031 08/26/22 2100 08/26/22 2130  BP: (!) 149/93  (!) 153/92 (!) 143/87  Pulse: 86  85 87  Resp: 20  (!) 21 16  Temp:  (!) 97.3 F (36.3 C)    TempSrc:  Oral    SpO2: 96%  98% 94%  Weight:      Height:        WBC     Component Value Date/Time   WBC 9.3 08/26/2022 2003   LYMPHSABS 1.4 08/26/2022 2003   MONOABS 0.9 08/26/2022 2003   EOSABS 0.1 08/26/2022 2003   BASOSABS 0.0 08/26/2022 2003     Lactic Acid, Venous    Component Value Date/Time   LATICACIDVEN 1.1 08/26/2022 2003     UA  no evidence of UTI      Urine analysis:    Component Value Date/Time   COLORURINE YELLOW 08/26/2022 2008   APPEARANCEUR HAZY (A) 08/26/2022 2008   LABSPEC 1.013 08/26/2022 2008   PHURINE 6.0 08/26/2022 2008   GLUCOSEU NEGATIVE 08/26/2022 2008   GLUCOSEU >1000 06/08/2010 0000    HGBUR NEGATIVE 08/26/2022 2008   BILIRUBINUR NEGATIVE 08/26/2022 2008   KETONESUR NEGATIVE 08/26/2022 2008   PROTEINUR NEGATIVE 08/26/2022 2008   UROBILINOGEN 0.2 05/04/2014 1339   NITRITE NEGATIVE 08/26/2022 2008   LEUKOCYTESUR TRACE (A) 08/26/2022 2008    Results for orders placed or performed during the hospital encounter of 04/26/18  Group A Strep by PCR     Status: None   Collection Time: 04/26/18 10:00 PM   Specimen: Throat; Sterile Swab  Result Value Ref Range Status   Group A Strep by PCR NOT DETECTED NOT DETECTED Final    Comment: Performed at [facility], [address] [place], [place] [address]  Culture, blood (routine x 2)     Status: None   Collection Time: 04/26/18 10:25 PM   Specimen: BLOOD  Result Value Ref Range Status   Specimen Description   Final    BLOOD BLOOD RIGHT ARM Performed at [facility], [address] [place], [place] [address]    Special Requests   Final    BOTTLES DRAWN AEROBIC AND ANAEROBIC Blood Culture adequate volume Performed at [facility], [address] [place], [place] [address]    Culture   Final    NO GROWTH 5 DAYS Performed at [facility], [address] [place], [place] [address]    Report Status 05/02/2018 FINAL  Final  Culture, blood (routine x 2)     Status: None   Collection Time: 04/26/18 10:38 PM   Specimen: BLOOD  Result Value Ref Range Status   Specimen Description   Final    BLOOD BLOOD RIGHT HAND Performed at [facility], [address] [place], [place] [address]    Special Requests   Final    BOTTLES DRAWN AEROBIC AND ANAEROBIC Blood Culture adequate volume Performed at [facility], [address] [place], [place] [address]    Culture   Final    NO GROWTH 5 DAYS Performed at [facility], [address], [place], [place] [address]    Report Status 05/02/2018 FINAL  Final       __________________________________________________________ Recent Labs  Lab 08/26/22 2003  NA 136  K 2.3*  CO2 29  GLUCOSE 83  BUN 62*  CREATININE 3.46*  CALCIUM 7.7*  MG 1.2*    Cr  stable,    Lab Results  Component Value Date   CREATININE 3.46 (H) 08/26/2022   CREATININE 1.02 (H) 08/27/2018   CREATININE 0.91 04/26/2018    Recent Labs  Lab 08/26/22 2003  AST 21  ALT 23  ALKPHOS 104  BILITOT 0.8  PROT 7.9  ALBUMIN 4.0   Lab Results  Component Value Date   CALCIUM 7.7 (L) 08/26/2022   PHOS 5.9 (H) 09/11/2015    Plt: Lab Results  Component Value Date   PLT 319 08/26/2022       Recent Labs  Lab 08/26/22 2003  WBC 9.3  NEUTROABS 6.8  HGB 12.5  HCT 39.4  MCV 79.9*  PLT 319    HG/HCT   stable,  Component Value Date/Time   HGB 12.5 08/26/2022 2003   HCT 39.4 08/26/2022 2003   MCV 79.9 (L) 08/26/2022 2003     Recent Labs  Lab 08/26/22 2003  LIPASE 21    __________________________________ Hospitalist was called for admission for   Hypokalemia    AKI (acute kidney injury) (HCC)    Dehydration    Diarrhea of presumed infectious origin    The following Work up has been ordered so far:  Orders Placed This Encounter  Procedures   Gastrointestinal Panel by PCR , Stool   C Difficile Quick Screen w PCR reflex   Urinalysis, Routine w reflex microscopic -Urine, Clean Catch   Lipase, blood   Comprehensive metabolic panel   CBC with Differential   Magnesium   Lactic acid, plasma   Diet NPO time specified   Document Height and Actual Weight   Consult for Unassigned Medical Admission   Enteric precautions (UV disinfection) C difficile, Norovirus   I-Stat beta hCG blood, ED   CBG monitoring, ED   EKG 12-Lead   ED EKG     OTHER Significant initial  Findings:  labs showing:     DM  labs:  HbA1C: No results for input(s): "HGBA1C" in the last 8760 hours.     CBG (last 3)  Recent Labs    08/26/22 2010  GLUCAP 71           Cultures:    Component Value Date/Time   SDES  04/26/2018 2238    BLOOD BLOOD RIGHT HAND Performed at [facility], [address] [place], [place] [address]    [facility]  04/26/2018 2238    BOTTLES DRAWN AEROBIC AND ANAEROBIC Blood Culture adequate volume Performed at [facility], [address], [place], [place] [address]    CULT  04/26/2018 2238    NO GROWTH 5 DAYS Performed at [facility], [address], [place], [place] [address]    REPTSTATUS 05/02/2018 FINAL 04/26/2018 2238     Radiological Exams on Admission: No results found. _______________________________________________________________________________________________________ Latest  Blood pressure (!) 143/87, pulse 87, temperature (!) 97.3 F (36.3 C), temperature source Oral, resp. rate 16, height 5' 6.5" (1.689 m), weight 111.1 kg, SpO2 94 %.   Vitals  labs and radiology finding personally reviewed  Review of Systems:    Pertinent positives include:  abdominal pain, nausea, vomiting, diarrhea,  chills, fatigue Constitutional:  No weight loss, night sweats, Fevers,, weight loss  HEENT:  No headaches, Difficulty swallowing,Tooth/dental problems,Sore throat,  No sneezing, itching, ear ache, nasal congestion, post nasal drip,  Cardio-vascular:  No chest pain, Orthopnea, PND, anasarca, dizziness, palpitations.no Bilateral lower extremity swelling  GI:  No heartburn, indigestion,  change in bowel habits, loss of appetite, melena, blood in stool, hematemesis Resp:  no shortness of breath at rest. No dyspnea on exertion, No excess mucus, no productive cough, No non-productive cough, No coughing up of blood.No change in color of mucus.No wheezing. Skin:  no rash or lesions. No jaundice GU:  no dysuria, change in color of urine, no urgency or frequency. No straining to urinate.  No flank pain.  Musculoskeletal:  No joint pain or no joint swelling. No decreased range of motion. No  back pain.  Psych:  No change in mood or affect. No depression or anxiety. No memory loss.  Neuro: no localizing neurological complaints, no tingling, no weakness, no double vision, no gait abnormality, no slurred speech, no confusion  All systems reviewed and apart from HOPI all are negative _______________________________________________________________________________________________ Past Medical History:   Past Medical History:  Diagnosis Date   Allergy    Anemia associated with chronic renal failure    Anxiety    Asthma    Atypical chest pain    long-standing -- normal cardio cath 06-27-2012 and normal nuclear stress test 06-25-2016   AV (arteriovenous fistula) (HCC)    for dialysis-- currently located left radiocephalic   CAD (coronary artery disease) cardiologist-- dr Sharol Roussel [facility] [place] [facility] in [place])   a. False positive stress echo 06/2012 at [facility] - cath with no obstructive CAD at [facility] (mild luminal irregularities in LAD, moderate diffuse disease in distal RCA).   Depression    Elevated lipids    ESRD on hemodialysis [facility]) NEPHROLOGIST-  DR MATTINGLY   started dialysis 01/29/11: [facility] on MWF   Gastroparesis    GERD (gastroesophageal reflux disease)    History of pneumonia    HCAP 04/ 2017   Hyperlipidemia    Hyperthyroidism    Menorrhagia    Other secondary hypertension    associated to diabetes -- followed by cardiologist ( dr Sharol Roussel)     Peripheral neuropathy    PONV (postoperative nausea and vomiting)    Pre-transplant evaluation for kidney transplant    [facility]   Renal failure syndrome 03/05/2022   Secondary hyperparathyroidism of renal origin [facility])    Sleep apnea    Type 2 diabetes mellitus, with long-term current use of insulin (HCC)    dx 1985      Past Surgical History:  Procedure Laterality Date   AV FISTULA PLACEMENT  08/2010   Left radiocephalic AVF   AV FISTULA  PLACEMENT Right 05/07/2014   Procedure: ARTERIOVENOUS (AV) FISTULA CREATION;  Surgeon: Sherren Kerns, MD;  Location: [facility] OR;  Service: Vascular;  Laterality: Right;   AV FISTULA PLACEMENT Right 07/02/2014   Procedure: INSERTION OF ARTERIOVENOUS (AV) GORE-TEX GRAFT ARM;  Surgeon: Sherren Kerns, MD;  Location: [facility] OR;  Service: Vascular;  Laterality: Right;   CARDIOVASCULAR STRESS TEST  06/25/2016   [facility]   normal nuclear perfusion study w/ no ischemia/  normal LV function and wall motion , ef 51%   CESAREAN SECTION  03/22/2001   w/ Bilateral Tubal Ligation   COLONOSCOPY     COLONOSCOPY WITH ESOPHAGOGASTRODUODENOSCOPY (EGD)  10/19/2011   DILATION AND CURETTAGE OF UTERUS  05/12/2000   w/ suction for missed ab   DOBUTAMINE STRESS ECHO  06/04/2016    [facility]   normal stress echo w/ no chest pain or ischemia/  normal LV function and wall motion , stress ef 60-65%   ESOPHAGOGASTRODUODENOSCOPY  10/19/2011   Dr. Stan Head   FEMUR IM NAIL Left child   removed   FOOT SURGERY Left 2014 approx.   HYSTEROSCOPY WITH NOVASURE N/A 08/19/2016   Procedure: HYSTEROSCOPY WITH NOVASURE;  Surgeon: Huel Cote, MD;  Location: [facility];  Service: Gynecology;  Laterality: N/A;   KIDNEY TRANSPLANT  09/18/2016   LAPAROSCOPIC CHOLECYSTECTOMY  1994   LEFT HEART CATHETERIZATION WITH CORONARY ANGIOGRAM N/A 06/27/2012   Procedure: LEFT HEART CATHETERIZATION WITH CORONARY ANGIOGRAM;  Surgeon: Laurey Morale, MD;  Location: [facility] CATH LAB;  Service: Cardiovascular;  Laterality: N/A; No ostructive CAD, normal LVF, ef 55-60% (mild LAD luminal irregularities, moderate dRCA diffuse disease)   LIGATION GORETEX FISTULA  01/04/11   Left AVF   LIGATION OF ARTERIOVENOUS  FISTULA Left 08/21/2014   Procedure: LIGATION OF ARTERIOVENOUS  FISTULA;  Surgeon: Annice Needy, MD;  Location: [facility] ORS;  Service: Vascular;  Laterality: Left;   LIGATION OF ARTERIOVENOUS  FISTULA Left 06/29/2017   Procedure: LIGATION OF  ARTERIOVENOUS  FISTULA ( RADIOCEPHALIC );  Surgeon: Annice Needy, MD;  Location: [facility] ORS;  Service: Vascular;  Laterality: Left;   NEPHRECTOMY TRANSPLANTED ORGAN     PERIPHERAL VASCULAR CATHETERIZATION Left 08/08/2014   Procedure: Upper Extremity Angiography;  Surgeon: Annice Needy, MD;  Location: [facility] INVASIVE CV LAB;  Service: Cardiovascular;  Laterality: Left;   PERIPHERAL VASCULAR CATHETERIZATION Left 08/08/2014   Procedure: Upper Extremity Intervention;  Surgeon: Annice Needy, MD;  Location: [facility] INVASIVE CV LAB;  Service: Cardiovascular;  Laterality: Left;   PERIPHERAL VASCULAR CATHETERIZATION Left 03/08/2016   Procedure: A/V Fistulagram;  Surgeon: Annice Needy, MD;  Location: [facility] INVASIVE CV LAB;  Service: Cardiovascular;  Laterality: Left;   POLYPECTOMY     THROMBECTOMY AND REVISION OF ARTERIOVENTOUS (AV) GORETEX  GRAFT Right 07/16/2014   Procedure: THROMBECTOMY  Right  arm  ARTERIOVENOUS  GORETEX  GRAFT;  Surgeon: Sherren Kerns, MD;  Location: [facility] OR;  Service: Vascular;  Laterality: Right;   TRANSTHORACIC ECHOCARDIOGRAM  06/04/2016   mild concentric LVH, ef 55-60%,  mild TR,  borderline LAE,  trivial MR and PR    Social History:  Ambulatory   independently       reports that she has never smoked. She has never used smokeless tobacco. She reports that she does not drink alcohol and does not use drugs.   Family History:   Family History  Problem Relation Age of Onset   Heart disease Mother        heart attack at 64 y/o-infected valve   Kidney disease Mother        was a dialysis patient   Diabetes Father    Hypertension Father    Heart disease Brother    Arthritis Brother    Diabetes Maternal Aunt    Hypertension Maternal Aunt    Kidney failure Paternal Aunt    Diabetes Paternal Aunt    Heart disease Paternal Aunt    Hypertension Paternal Aunt    Kidney failure Paternal Uncle    Kidney disease Maternal Grandmother        pre-dialysis   Colon cancer Neg Hx    Colon polyps  Neg Hx    Crohn's disease Neg Hx    Esophageal cancer Neg Hx    Rectal cancer Neg Hx    Stomach cancer Neg Hx    Ulcerative colitis Neg Hx    ______________________________________________________________________________________________ Allergies: Allergies  Allergen Reactions   Ciprofloxacin Hives, Itching and Nausea And Vomiting   Fluocinolone Itching, Nausea And Vomiting and Other (See Comments)   Hydromorphone Hives   Strawberry Extract Itching and Other (See Comments)    Pt states that this medication makes her tongue raw.     Hydromorphone Hcl Hives   Phenytoin Sodium Extended Itching   Tylenol [Acetaminophen] Itching    04/26/18 patient states she does not know why this is in her file because to her knowledge she has never had a reaction to tylenol.   Adhesive [Tape] Itching and Rash   Chlorhexidine Gluconate Itching   Clindamycin Diarrhea, Nausea And Vomiting and Rash   Oxycodone Nausea And Vomiting, Rash and Other (See Comments)    Reaction:  Hallucinations    Shrimp [Shellfish Allergy] Rash  Prior to Admission medications   Medication Sig Start Date End Date Taking? Authorizing Provider  albuterol (PROVENTIL HFA;VENTOLIN HFA) 108 (90 Base) MCG/ACT inhaler Inhale 2 puffs into the lungs every 6 (six) hours as needed for wheezing or shortness of breath. 11/25/17   Armandina Stammer I, NP  amLODipine (NORVASC) 10 MG tablet Take 1 tablet by mouth daily. 02/11/22   [provider]  ARIPiprazole (ABILIFY) 30 MG tablet Take 30 mg by mouth daily. 01/26/22   [provider]  atorvastatin (LIPITOR) 10 MG tablet Take 10 mg by mouth daily. 02/08/22   [provider]  bumetanide (BUMEX) 2 MG tablet Take 1 tablet by mouth 2 (two) times daily. 08/06/21   [provider]  buPROPion (WELLBUTRIN XL) 150 MG 24 hr tablet Take 1 tablet (150 mg total) by mouth daily after breakfast. For depression 11/26/17   Armandina Stammer I, NP  buPROPion (WELLBUTRIN XL) 300 MG 24 hr  tablet Take 300 mg by mouth daily.    [provider]  cetirizine (ZYRTEC) 10 MG tablet Take 10 mg by mouth 2 (two) times daily.    [provider]  cinacalcet (SENSIPAR) 30 MG tablet Take 30 mg by mouth daily. Patient not taking: Reported on 05/10/2022 03/10/22   [provider]  citalopram (CELEXA) 40 MG tablet Take 40 mg by mouth daily.    [provider]  cloNIDine (CATAPRES) 0.3 MG tablet Take 0.3 mg by mouth 3 (three) times daily.     [provider]  Dulaglutide 4.5 MG/0.5ML SOPN Inject into the skin once a week. 12/09/21   [provider]  furosemide (LASIX) 20 MG tablet Take 1 tablet (20 mg total) by mouth daily. For swellings Patient not taking: Reported on 04/12/2022 11/26/17   Armandina Stammer I, NP  gabapentin (NEURONTIN) 300 MG capsule Take 300 mg by mouth at bedtime as needed. pain    [provider]  hydrOXYzine (ATARAX) 25 MG tablet Take 25 mg by mouth at bedtime.    [provider]  insulin aspart (NOVOLOG) 100 UNIT/ML injection USE UP TO 100 UNITS ONCE DAILY IN INSULIN PUMP. DX CODE E11.42 03/10/22   [provider]  Insulin Human (INSULIN PUMP) SOLN Inject 1 each into the skin See admin instructions. Patient not taking: Reported on 04/12/2022    [provider]  labetalol (NORMODYNE) 200 MG tablet Take 2 tablets (400 mg total) by mouth 3 (three) times daily. For high blood pressure 11/25/17   Nwoko, Nicole Kindred I, NP  montelukast (SINGULAIR) 10 MG tablet Take 10 mg by mouth at bedtime.    [provider]  mycophenolate (MYFORTIC) 360 MG TBEC EC tablet Take 2 tablets (720 mg total) by mouth 2 (two) times daily. For renal transplant 11/25/17   Armandina Stammer I, NP  naloxone [facility]) nasal spray 4 mg/0.1 mL  10/28/21   [provider]  nystatin (MYCOSTATIN) 100000 UNIT/ML suspension Take by mouth. 11/02/21   [provider]  Oxycodone HCl 10 MG TABS PLEASE SEE ATTACHED FOR DETAILED DIRECTIONS     [provider]  pantoprazole (PROTONIX) 40 MG tablet Take 1 tablet (40 mg total) by mouth daily. For acid reflux 11/26/17   Armandina Stammer I, NP  prazosin (MINIPRESS) 1 MG capsule Take 1 mg by mouth at bedtime.    [provider]  predniSONE (DELTASONE) 5 MG tablet Take 5 mg by mouth daily with breakfast.    [provider]  simvastatin (ZOCOR) 10 MG tablet  Take 1 tablet (10 mg total) by mouth at bedtime. For high cholesterol 11/25/17   Armandina Stammer I, NP  sodium bicarbonate 650 MG tablet Take 1,300 mg by mouth 2 (two) times daily.    [provider]  sulfamethoxazole-trimethoprim (BACTRIM,SEPTRA) 400-80 MG tablet Take 1 tablet by mouth 3 (three) times a week. For infection Patient taking differently: Take 1 tablet by mouth every Monday, Wednesday, and Friday. For infection 11/28/17   Armandina Stammer I, NP  tacrolimus (PROGRAF) 1 MG capsule Take 4 mg by mouth 2 (two) times daily.     [provider]  zolpidem (AMBIEN) 5 MG tablet Take 1 tablet (5 mg total) by mouth at bedtime as needed for sleep. 11/25/17   Armandina Stammer I, NP    ___________________________________________________________________________________________________ Physical Exam:    08/26/2022    9:30 PM 08/26/2022    9:00 PM 08/26/2022    8:30 PM  Vitals with BMI  Systolic 143 153 130  Diastolic 87 92 93  Pulse 87 85 86     1. General:  in No  Acute distress   Chronically ill-appearing 2. Psychological: Alert and   Oriented 3. Head/ENT:    Dry Mucous Membranes                          Head Non traumatic, neck supple                            Poor Dentition 4. SKIN:   decreased Skin turgor,  Skin clean Dry and intact no rash insulin pump in place    5. Heart: Regular rate and rhythm no  Murmur, no Rub or gallop 6. Lungs: y, no wheezes or crackles   7. Abdomen: Soft,  non-tender, Non distended   obese  bowel sounds present 8. Lower extremities: no clubbing, cyanosis, no  edema 9.  Neurologically Grossly intact, moving all 4 extremities equally   10. MSK: Normal range of motion    Chart has been reviewed  ______________________________________________________________________________________________  Assessment/Plan  47 y.o. female with medical history significant of hx of Kidney Transplant, Dm2, GERD, HTN,  asthma    Admitted for   Hypokalemia  AKI (acute kidney injury) (HCC)  Dehydration   Diarrhea of presumed infectious origin    Present on Admission:  AKI (acute kidney injury) (HCC)  MDD (major depressive disorder), recurrent, severe, with psychosis (HCC)  Diabetes mellitus with nephropathy (HCC)  Essential hypertension  Nausea vomiting and diarrhea  Hypokalemia  Hypomagnesemia  Dehydration  Prolonged QT interval     AKI (acute kidney injury) (HCC) In the setting of dehydration and but known history of renal transplant Discussed with nephrology obtain electrolytes Rehydrate and follow renal function Hold Myfortic for today patient is status postrenal transplant  MDD (major depressive disorder), recurrent, severe, with psychosis (HCC) Chronic stable continue Wellbutrin  Diabetes mellitus with nephropathy (HCC) Order sliding scale Cont insulin pump as bg has been well controlled  Essential hypertension Allow permissive hypertension for tonight given dehydration  Status post deceased-donor kidney transplantation Continue prednisone as 5 mg a day and Prograf Hold Myfortic given diarrhea  Nausea vomiting and diarrhea Possibly is secondary to exposure to Salmonella/Campylobacter.  Obtain CT to evaluate for presence of colitis. Obtain gastric studies Treat as necessary  Hypokalemia Replace and check magnesium and phosphate levels  Hypomagnesemia Will replace  Dehydration Rehydrate and check orthostatics prior to discharge  Prolonged QT interval - will monitor on tele avoid QT prolonging medications, rehydrate correct  electrolytes    Other plan as per orders.  DVT prophylaxis:  SCD    Code Status:    Code Status: Prior FULL CODE as per patient   I had personally discussed CODE STATUS with patient  ACP none   Family Communication:   Family not at  Bedside    Diet  Diet Orders (From admission, onward)     Start     Ordered   08/26/22 1955  Diet NPO time specified  Diet effective now        08/26/22 1955            Disposition Plan:    To home once workup is complete and patient is stable   Following barriers for discharge:                            Electrolytes corrected                                                            Pain controlled with PO medications                                                           Will need consultants to evaluate patient prior to discharge    Consults called: nephrology is aware   Admission status:  ED Disposition     ED Disposition  Admit   Condition  --   Comment  Hospital Area: [facility] [[id number]]  Level of Care: Telemetry Medical [[id number]]  May admit patient to [facility] or [facility] if equivalent level of care is available:: No  Covid Evaluation: Confirmed COVID Positive  Diagnosis: AKI (acute kidney injury) [facility]) [[id number]]  Admitting Physician: Therisa Doyne [[id number]]  Attending Physician: Therisa Doyne [[id number]]  Certification:: I certify this patient will need inpatient services for at least 2 midnights  Estimated Length of Stay: 2            inpatient     I Expect 2 midnight stay secondary to severity of patient's current illness need for inpatient interventions justified by the following:    Severe lab/radiological/exam abnormalities including:     and extensive comorbidities including: Hx of transplant  That are currently affecting medical management.   I expect  patient to be hospitalized for 2 midnights requiring inpatient medical care.  Patient is at high risk for adverse  outcome (such as loss of life or disability) if not treated.  Indication for inpatient stay as follows:     inability to maintain oral hydration      Need for  IV fluids,     Level of care     tele  For 12H     Nahome Bublitz 08/26/2022, 11:32 PM    [facility]     after 2 AM please page floor coverage PA If 7AM-7PM, please contact the day team taking care of the patient using [email or web address]

## 2022-08-26 NOTE — Assessment & Plan Note (Addendum)
Order sliding scale Cont insulin pump as bg has been well controlled

## 2022-08-26 NOTE — Assessment & Plan Note (Signed)
Chronic stable continue Wellbutrin

## 2022-08-26 NOTE — ED Triage Notes (Addendum)
Pt presents to ED following abnormal labs K 2.6 at PCP. Seeked appt with PCP d/t concerns for NVD x 1 weeks. Has now begun to feel generalized weakness and lower abdominal cramping. Endorses chills/fevers at home. No sick exposure. No international travel. Currently taking bactrim. No dialysis, kidney transplant 2018.

## 2022-08-26 NOTE — Assessment & Plan Note (Signed)
Possibly is secondary to exposure to Salmonella/Campylobacter.  Obtain CT to evaluate for presence of colitis. Obtain gastric studies Treat as necessary

## 2022-08-26 NOTE — Assessment & Plan Note (Signed)
Continue prednisone as 5 mg a day and Prograf Hold Myfortic given diarrhea

## 2022-08-26 NOTE — Assessment & Plan Note (Signed)
Rehydrate and check orthostatics prior to discharge

## 2022-08-26 NOTE — Assessment & Plan Note (Signed)
-   will monitor on tele avoid QT prolonging medications, rehydrate correct electrolytes  

## 2022-08-26 NOTE — Subjective & Objective (Signed)
Patient comes in with nausea vomiting diarrhea for past 1 week noted at primary care office that potassium was down to 2.6 has been feeling lightheaded weak generally and abdominal cramping.  Also some chills and fevers at home.  No sick contacts patient is status post kidney transplant in 2018 No blood in stool Last week patient significant other was hospitalized for Salmonella and Campylobacter after eating at cookout the patient ate the same thing Patient is states that eating and drinking makes more abdominal pain and she has been limited. She is chronically on Bactrim 3 times a week Patient is also on Myfortic but that is been chronic And prednisone 5 mg a day as well as Prograf 4 mg twice a

## 2022-08-26 NOTE — ED Provider Notes (Signed)
[place] EMERGENCY DEPARTMENT AT [facility] Provider Note   CSN: [id number] Arrival date & time: 08/26/22  1937     History  Chief Complaint  Patient presents with   Abnormal Lab    Linda Ellison is a 47 y.o. female.  Patient is a 47 year old female with a history of kidney transplant 6 years ago, CAD, diabetes, hypertension who is presenting today with a week of persistent diarrhea between 5-10 episodes per day and occasional vomiting.  She reports she is just trying to feel more more weak with worsening abdominal cramps.  No blood in the stool.  Patient's significant other reports that he was recently hospitalized a week and a half ago after having Salmonella and Campylobacter after eating at cookout.  He reports the patient ate the same thing he did.  She has not had fever, cough or shortness of breath.  The only discomfort she has in her abdomen is in the left lower side.  No pain over her renal transplant.  She saw her PCP today and was given a stool specimen kit but they did blood work and told her her potassium was low at 2.6 and told her she needed to come to the hospital.  She has been eating and drinking very little because it causes more abdominal cramping and diarrhea.  She does take Bactrim 3 times a week but that is the only antibiotic she has been on and this has been persistent for some time now.  The history is provided by the patient.  Abnormal Lab      Home Medications Prior to Admission medications   Medication Sig Start Date End Date Taking? Authorizing Provider  albuterol (PROVENTIL HFA;VENTOLIN HFA) 108 (90 Base) MCG/ACT inhaler Inhale 2 puffs into the lungs every 6 (six) hours as needed for wheezing or shortness of breath. 11/25/17   Armandina Stammer I, NP  amLODipine (NORVASC) 10 MG tablet Take 1 tablet by mouth daily. 02/11/22   [provider]  ARIPiprazole (ABILIFY) 30 MG tablet Take 30 mg by mouth daily. 01/26/22   [provider]   atorvastatin (LIPITOR) 10 MG tablet Take 10 mg by mouth daily. 02/08/22   [provider]  bumetanide (BUMEX) 2 MG tablet Take 1 tablet by mouth 2 (two) times daily. 08/06/21   [provider]  buPROPion (WELLBUTRIN XL) 150 MG 24 hr tablet Take 1 tablet (150 mg total) by mouth daily after breakfast. For depression 11/26/17   Armandina Stammer I, NP  buPROPion (WELLBUTRIN XL) 300 MG 24 hr tablet Take 300 mg by mouth daily.    [provider]  cetirizine (ZYRTEC) 10 MG tablet Take 10 mg by mouth 2 (two) times daily.    [provider]  cinacalcet (SENSIPAR) 30 MG tablet Take 30 mg by mouth daily. Patient not taking: Reported on 05/10/2022 03/10/22   [provider]  citalopram (CELEXA) 40 MG tablet Take 40 mg by mouth daily.    [provider]  cloNIDine (CATAPRES) 0.3 MG tablet Take 0.3 mg by mouth 3 (three) times daily.     [provider]  Dulaglutide 4.5 MG/0.5ML SOPN Inject into the skin once a week. 12/09/21   [provider]  furosemide (LASIX) 20 MG tablet Take 1 tablet (20 mg total) by mouth daily. For swellings Patient not taking: Reported on 04/12/2022 11/26/17   Armandina Stammer I, NP  gabapentin (NEURONTIN) 300 MG capsule Take 300 mg by mouth at bedtime as needed. pain  [provider]  hydrOXYzine (ATARAX) 25 MG tablet Take 25 mg by mouth at bedtime.    [provider]  insulin aspart (NOVOLOG) 100 UNIT/ML injection USE UP TO 100 UNITS ONCE DAILY IN INSULIN PUMP. DX CODE E11.42 03/10/22   [provider]  Insulin Human (INSULIN PUMP) SOLN Inject 1 each into the skin See admin instructions. Patient not taking: Reported on 04/12/2022    [provider]  labetalol (NORMODYNE) 200 MG tablet Take 2 tablets (400 mg total) by mouth 3 (three) times daily. For high blood pressure 11/25/17   Nwoko, Nicole Kindred I, NP  montelukast (SINGULAIR) 10 MG tablet Take 10 mg by mouth at bedtime.    [provider]   mycophenolate (MYFORTIC) 360 MG TBEC EC tablet Take 2 tablets (720 mg total) by mouth 2 (two) times daily. For renal transplant 11/25/17   Armandina Stammer I, NP  naloxone [facility]) nasal spray 4 mg/0.1 mL  10/28/21   [provider]  nystatin (MYCOSTATIN) 100000 UNIT/ML suspension Take by mouth. 11/02/21   [provider]  Oxycodone HCl 10 MG TABS PLEASE SEE ATTACHED FOR DETAILED DIRECTIONS    [provider]  pantoprazole (PROTONIX) 40 MG tablet Take 1 tablet (40 mg total) by mouth daily. For acid reflux 11/26/17   Armandina Stammer I, NP  prazosin (MINIPRESS) 1 MG capsule Take 1 mg by mouth at bedtime.    [provider]  predniSONE (DELTASONE) 5 MG tablet Take 5 mg by mouth daily with breakfast.    [provider]  simvastatin (ZOCOR) 10 MG tablet Take 1 tablet (10 mg total) by mouth at bedtime. For high cholesterol 11/25/17   Armandina Stammer I, NP  sodium bicarbonate 650 MG tablet Take 1,300 mg by mouth 2 (two) times daily.    [provider]  sulfamethoxazole-trimethoprim (BACTRIM,SEPTRA) 400-80 MG tablet Take 1 tablet by mouth 3 (three) times a week. For infection Patient taking differently: Take 1 tablet by mouth every Monday, Wednesday, and Friday. For infection 11/28/17   Armandina Stammer I, NP  tacrolimus (PROGRAF) 1 MG capsule Take 4 mg by mouth 2 (two) times daily.     [provider]  zolpidem (AMBIEN) 5 MG tablet Take 1 tablet (5 mg total) by mouth at bedtime as needed for sleep. 11/25/17   Armandina Stammer I, NP      Allergies    Ciprofloxacin, Fluocinolone, Hydromorphone, Strawberry extract, Hydromorphone hcl, Phenytoin sodium extended, Tylenol [acetaminophen], Adhesive [tape], Chlorhexidine gluconate, Clindamycin, Oxycodone, and Shrimp [shellfish allergy]    Review of Systems   Review of Systems  Physical Exam Updated Vital Signs BP (!) 143/87   Pulse 87   Temp (!) 97.3 F (36.3 C) (Oral)   Resp 16   Ht 5' 6.5" (1.689 m)   Wt 111.1  kg   SpO2 94%   BMI 38.95 kg/m  Physical Exam Vitals and nursing note reviewed.  Constitutional:      General: She is not in acute distress.    Appearance: She is well-developed.  HENT:     Head: Normocephalic and atraumatic.  Eyes:     Pupils: Pupils are equal, round, and reactive to light.  Cardiovascular:     Rate and Rhythm: Normal rate and regular rhythm.     Heart sounds: Normal heart sounds. No murmur heard.    No friction rub.  Pulmonary:     Effort: Pulmonary effort is normal.     Breath sounds: Normal breath sounds. No wheezing  or rales.  Abdominal:     General: Bowel sounds are normal. There is no distension.     Palpations: Abdomen is soft.     Tenderness: There is abdominal tenderness in the left lower quadrant. There is no guarding or rebound.     Comments: No pain over renal transplant  Musculoskeletal:        General: No tenderness. Normal range of motion.     Comments: No edema  Skin:    General: Skin is warm and dry.     Findings: No rash.  Neurological:     Mental Status: She is alert and oriented to person, place, and time. Mental status is at baseline.     Cranial Nerves: No cranial nerve deficit.  Psychiatric:        Mood and Affect: Mood normal.        Behavior: Behavior normal.     ED Results / Procedures / Treatments   Labs (all labs ordered are listed, but only abnormal results are displayed) Labs Reviewed  URINALYSIS, ROUTINE W REFLEX MICROSCOPIC - Abnormal; Notable for the following components:      Result Value   APPearance HAZY (*)    Leukocytes,Ua TRACE (*)    Bacteria, UA RARE (*)    All other components within normal limits  COMPREHENSIVE METABOLIC PANEL - Abnormal; Notable for the following components:   Potassium 2.3 (*)    Chloride 88 (*)    BUN 62 (*)    Creatinine, Ser 3.46 (*)    Calcium 7.7 (*)    GFR, Estimated 16 (*)    Anion gap 19 (*)    All other components within normal limits  CBC WITH DIFFERENTIAL/PLATELET -  Abnormal; Notable for the following components:   MCV 79.9 (*)    MCH 25.4 (*)    All other components within normal limits  MAGNESIUM - Abnormal; Notable for the following components:   Magnesium 1.2 (*)    All other components within normal limits  GASTROINTESTINAL PANEL BY PCR, STOOL (REPLACES STOOL CULTURE)  C DIFFICILE QUICK SCREEN W PCR REFLEX    LIPASE, BLOOD  LACTIC ACID, PLASMA  LACTIC ACID, PLASMA  I-STAT BETA HCG BLOOD, ED ([facility], [facility], [facility] ONLY)  CBG MONITORING, ED    EKG EKG Interpretation  Date/Time:  Thursday Aug 26 2022 19:50:20 EDT Ventricular Rate:  85 PR Interval:  148 QRS Duration: 84 QT Interval:  510 QTC Calculation: 606 R Axis:   1 Text Interpretation: Long QTc Normal sinus rhythm Right atrial enlargement Nonspecific ST abnormality new Prolonged QT When compared with ECG of 27-Aug-2018 13:18, PREVIOUS ECG IS PRESENT Reconfirmed by Gwyneth Sprout ([id number]) on 08/26/2022 10:21:23 PM  Radiology No results found.  Procedures Procedures    Medications Ordered in ED Medications  lactated ringers bolus 2,000 mL (has no administration in time range)  potassium chloride 10 mEq in 100 mL IVPB (has no administration in time range)    ED Course/ Medical Decision Making/ A&P                             Medical Decision Making Amount and/or Complexity of Data Reviewed Independent Historian: spouse External Data Reviewed: notes. Labs: ordered. Decision-making details documented in ED Course. ECG/medicine tests: ordered and independent interpretation performed. Decision-making details documented in ED Course.  Risk Prescription drug management. Decision regarding hospitalization.   Pt with multiple medical problems and comorbidities and presenting today  with a complaint that caries a high risk for morbidity and mortality.  Here today due to intermittent vomiting and persistent diarrhea.  Concern for foodborne illness given patient's family member also had  similar symptoms and was positive for Campylobacter and Salmonella after eating at a cookout.  Patient symptoms started in a similar timeframe.  She has been taking all of her antirejection medication and has kept them down.  Patient is nontoxic-appearing but per PCPs report potassium levels were low today.  Could be from the persistent diarrhea, poor oral intake and some vomiting.  She denies any bloody stools.  Will confirm hypokalemia but also need to confirm no AKI today.  GI pathogen panel was sent.  As well as magnesium levels. I independently interpreted patient's labs and EKG.  hCG is negative, CBG is 71, UA without acute findings, CBC within normal limits, CMP showing hypokalemia at 2.3 as well as new AKI with creatinine of 3.46 from her baseline of 1.5 and anion gap of 19 EKG with new prolonged QT.  Suspect AKI is related to dehydration and persistent diarrhea. Patient given IV fluids and potassium given her new prolonged QT.  Will admit for further care.  Findings discussed with the patient and her family member.  They are comfortable with this plan.Marland Kitchen  CRITICAL CARE Performed by: Halaina Vanduzer Total critical care time: 30 minutes Critical care time was exclusive of separately billable procedures and treating other patients. Critical care was necessary to treat or prevent imminent or life-threatening deterioration. Critical care was time spent personally by me on the following activities: development of treatment plan with patient and/or surrogate as well as nursing, discussions with consultants, evaluation of patient's response to treatment, examination of patient, obtaining history from patient or surrogate, ordering and performing treatments and interventions, ordering and review of laboratory studies, ordering and review of radiographic studies, pulse oximetry and re-evaluation of patient's condition.        Final Clinical Impression(s) / ED Diagnoses Final diagnoses:  Hypokalemia   AKI (acute kidney injury) (HCC)  Dehydration  Diarrhea of presumed infectious origin    Rx / DC Orders ED Discharge Orders     None         Gwyneth Sprout, MD 08/26/22 2224

## 2022-08-26 NOTE — Assessment & Plan Note (Signed)
Will replace ?

## 2022-08-26 NOTE — Assessment & Plan Note (Signed)
In the setting of dehydration and but known history of renal transplant Discussed with nephrology obtain electrolytes Rehydrate and follow renal function Hold Myfortic for today patient is status postrenal transplant

## 2022-08-26 NOTE — Assessment & Plan Note (Signed)
Replace and check magnesium and phosphate levels

## 2022-08-26 NOTE — ED Notes (Signed)
Per communication with Dr. Anitra Lauth, second lactic does not need to be drawn.

## 2022-08-27 ENCOUNTER — Other Ambulatory Visit: Payer: Self-pay

## 2022-08-27 ENCOUNTER — Encounter (HOSPITAL_COMMUNITY): Payer: Self-pay | Admitting: Internal Medicine

## 2022-08-27 DIAGNOSIS — N179 Acute kidney failure, unspecified: Secondary | ICD-10-CM | POA: Diagnosis not present

## 2022-08-27 LAB — GASTROINTESTINAL PANEL BY PCR, STOOL (REPLACES STOOL CULTURE)

## 2022-08-27 LAB — COMPREHENSIVE METABOLIC PANEL
ALT: 20 U/L (ref 0–44)
AST: 20 U/L (ref 15–41)
Albumin: 3.4 g/dL — ABNORMAL LOW (ref 3.5–5.0)
Alkaline Phosphatase: 90 U/L (ref 38–126)
Anion gap: 15 (ref 5–15)
BUN: 50 mg/dL — ABNORMAL HIGH (ref 6–20)
CO2: 29 mmol/L (ref 22–32)
Calcium: 7.5 mg/dL — ABNORMAL LOW (ref 8.9–10.3)
Chloride: 94 mmol/L — ABNORMAL LOW (ref 98–111)
Creatinine, Ser: 2.33 mg/dL — ABNORMAL HIGH (ref 0.44–1.00)
GFR, Estimated: 25 mL/min — ABNORMAL LOW (ref >60)
Glucose, Bld: 94 mg/dL (ref 70–99)
Potassium: 2.2 mmol/L — CL (ref 3.5–5.1)
Sodium: 138 mmol/L (ref 135–145)
Total Bilirubin: 0.8 mg/dL (ref 0.3–1.2)
Total Protein: 6.5 g/dL (ref 6.5–8.1)

## 2022-08-27 LAB — PHOSPHORUS
Phosphorus: 4.6 mg/dL (ref 2.5–4.6)
Phosphorus: 3.7 mg/dL (ref 2.5–4.6)

## 2022-08-27 LAB — CREATININE, URINE, RANDOM: Creatinine, Urine: 58 mg/dL

## 2022-08-27 LAB — GLUCOSE, CAPILLARY
Glucose-Capillary: 101 mg/dL — ABNORMAL HIGH (ref 70–99)
Glucose-Capillary: 83 mg/dL (ref 70–99)
Glucose-Capillary: 135 mg/dL — ABNORMAL HIGH (ref 70–99)
Glucose-Capillary: 164 mg/dL — ABNORMAL HIGH (ref 70–99)
Glucose-Capillary: 169 mg/dL — ABNORMAL HIGH (ref 70–99)

## 2022-08-27 LAB — BASIC METABOLIC PANEL
Anion gap: 9 (ref 5–15)
BUN: 39 mg/dL — ABNORMAL HIGH (ref 6–20)
CO2: 28 mmol/L (ref 22–32)
Calcium: 7.5 mg/dL — ABNORMAL LOW (ref 8.9–10.3)
Chloride: 100 mmol/L (ref 98–111)
Creatinine, Ser: 1.81 mg/dL — ABNORMAL HIGH (ref 0.44–1.00)
GFR, Estimated: 35 mL/min — ABNORMAL LOW (ref >60)
Glucose, Bld: 176 mg/dL — ABNORMAL HIGH (ref 70–99)
Potassium: 2.9 mmol/L — ABNORMAL LOW (ref 3.5–5.1)
Sodium: 137 mmol/L (ref 135–145)

## 2022-08-27 LAB — CBC
HCT: 35.8 % — ABNORMAL LOW (ref 36.0–46.0)
Hemoglobin: 11.6 g/dL — ABNORMAL LOW (ref 12.0–15.0)
MCH: 25.9 pg — ABNORMAL LOW (ref 26.0–34.0)
MCHC: 32.4 g/dL (ref 30.0–36.0)
MCV: 79.9 fL — ABNORMAL LOW (ref 80.0–100.0)
Platelets: 288 10*3/uL (ref 150–400)
RBC: 4.48 MIL/uL (ref 3.87–5.11)
RDW: 12.3 % (ref 11.5–15.5)
WBC: 7.4 10*3/uL (ref 4.0–10.5)
nRBC: 0 % (ref 0.0–0.2)

## 2022-08-27 LAB — TSH
TSH: 1.532 u[IU]/mL (ref 0.350–4.500)
TSH: 1.346 u[IU]/mL (ref 0.350–4.500)

## 2022-08-27 LAB — C DIFFICILE QUICK SCREEN W PCR REFLEX
C Diff antigen: NEGATIVE
C Diff interpretation: NOT DETECTED
C Diff toxin: NEGATIVE

## 2022-08-27 LAB — CBG MONITORING, ED: Glucose-Capillary: 89 mg/dL (ref 70–99)

## 2022-08-27 LAB — SODIUM, URINE, RANDOM: Sodium, Ur: 61 mmol/L

## 2022-08-27 LAB — OSMOLALITY: Osmolality: 306 mOsm/kg — ABNORMAL HIGH (ref 275–295)

## 2022-08-27 LAB — MAGNESIUM: Magnesium: 1.6 mg/dL — ABNORMAL LOW (ref 1.7–2.4)

## 2022-08-27 LAB — HIV ANTIBODY (ROUTINE TESTING W REFLEX): HIV Screen 4th Generation wRfx: NONREACTIVE

## 2022-08-27 LAB — OSMOLALITY, URINE: Osmolality, Ur: 291 mOsm/kg — ABNORMAL LOW (ref 300–900)

## 2022-08-27 LAB — LACTIC ACID, PLASMA: Lactic Acid, Venous: 1.1 mmol/L (ref 0.5–1.9)

## 2022-08-27 LAB — CK: Total CK: 371 U/L — ABNORMAL HIGH (ref 38–234)

## 2022-08-27 LAB — PREALBUMIN: Prealbumin: 25 mg/dL (ref 18–38)

## 2022-08-27 MED ORDER — POTASSIUM CHLORIDE CRYS ER 20 MEQ PO TBCR
40.0000 meq | EXTENDED_RELEASE_TABLET | ORAL | Status: AC
  Administered 2022-08-27 (×3): 40 meq via ORAL
  Filled 2022-08-27 (×3): qty 2

## 2022-08-27 MED ORDER — ARIPIPRAZOLE 10 MG PO TABS
30.0000 mg | ORAL_TABLET | Freq: Every day | ORAL | Status: DC
  Administered 2022-08-27 – 2022-08-28 (×2): 30 mg via ORAL
  Filled 2022-08-27 (×2): qty 3

## 2022-08-27 MED ORDER — SODIUM CHLORIDE 0.9 % IV SOLN: INTRAVENOUS | Status: DC

## 2022-08-27 MED ORDER — LABETALOL HCL 200 MG PO TABS
400.0000 mg | ORAL_TABLET | Freq: Three times a day (TID) | ORAL | Status: DC
  Administered 2022-08-27 – 2022-08-28 (×4): 400 mg via ORAL
  Filled 2022-08-27 (×4): qty 2

## 2022-08-27 MED ORDER — FENTANYL CITRATE PF 50 MCG/ML IJ SOSY: 12.5000 ug | PREFILLED_SYRINGE | INTRAMUSCULAR | Status: DC | PRN

## 2022-08-27 MED ORDER — BUPROPION HCL ER (XL) 150 MG PO TB24: 150.0000 mg | ORAL_TABLET | Freq: Every day | ORAL | Status: DC

## 2022-08-27 MED ORDER — POTASSIUM CHLORIDE CRYS ER 20 MEQ PO TBCR
40.0000 meq | EXTENDED_RELEASE_TABLET | Freq: Two times a day (BID) | ORAL | Status: DC
  Administered 2022-08-27: 40 meq via ORAL
  Filled 2022-08-27: qty 2

## 2022-08-27 MED ORDER — MAGNESIUM SULFATE 2 GM/50ML IV SOLN
2.0000 g | Freq: Once | INTRAVENOUS | Status: AC
  Administered 2022-08-27: 2 g via INTRAVENOUS
  Filled 2022-08-27: qty 50

## 2022-08-27 MED ORDER — SODIUM BICARBONATE 650 MG PO TABS
1300.0000 mg | ORAL_TABLET | Freq: Two times a day (BID) | ORAL | Status: DC
  Administered 2022-08-27 – 2022-08-28 (×4): 1300 mg via ORAL
  Filled 2022-08-27 (×4): qty 2

## 2022-08-27 MED ORDER — MONTELUKAST SODIUM 10 MG PO TABS
10.0000 mg | ORAL_TABLET | Freq: Every day | ORAL | Status: DC
  Administered 2022-08-27 (×2): 10 mg via ORAL
  Filled 2022-08-27 (×2): qty 1

## 2022-08-27 MED ORDER — BUPROPION HCL ER (XL) 150 MG PO TB24
450.0000 mg | ORAL_TABLET | Freq: Every day | ORAL | Status: DC
  Administered 2022-08-27 – 2022-08-28 (×2): 450 mg via ORAL
  Filled 2022-08-27 (×2): qty 3

## 2022-08-27 MED ORDER — HYDROXYZINE HCL 25 MG PO TABS
25.0000 mg | ORAL_TABLET | Freq: Every day | ORAL | Status: DC
  Administered 2022-08-27 (×2): 25 mg via ORAL
  Filled 2022-08-27 (×2): qty 1

## 2022-08-27 MED ORDER — TACROLIMUS 1 MG PO CAPS
4.0000 mg | ORAL_CAPSULE | Freq: Every day | ORAL | Status: DC
  Administered 2022-08-27 (×2): 4 mg via ORAL
  Filled 2022-08-27 (×2): qty 4

## 2022-08-27 MED ORDER — GABAPENTIN 300 MG PO CAPS: 300.0000 mg | ORAL_CAPSULE | Freq: Every evening | ORAL | Status: DC | PRN

## 2022-08-27 MED ORDER — PREDNISONE 5 MG PO TABS
5.0000 mg | ORAL_TABLET | Freq: Every day | ORAL | Status: DC
  Administered 2022-08-27 – 2022-08-28 (×2): 5 mg via ORAL
  Filled 2022-08-27 (×2): qty 1

## 2022-08-27 MED ORDER — ORAL CARE MOUTH RINSE: 15.0000 mL | OROMUCOSAL | Status: DC | PRN

## 2022-08-27 MED ORDER — ATORVASTATIN CALCIUM 10 MG PO TABS
10.0000 mg | ORAL_TABLET | Freq: Every day | ORAL | Status: DC
  Administered 2022-08-27 – 2022-08-28 (×2): 10 mg via ORAL
  Filled 2022-08-27 (×2): qty 1

## 2022-08-27 MED ORDER — POTASSIUM CHLORIDE CRYS ER 20 MEQ PO TBCR
40.0000 meq | EXTENDED_RELEASE_TABLET | Freq: Once | ORAL | Status: AC
  Administered 2022-08-27: 40 meq via ORAL
  Filled 2022-08-27: qty 2

## 2022-08-27 MED ORDER — BUPROPION HCL ER (XL) 150 MG PO TB24: 300.0000 mg | ORAL_TABLET | Freq: Every day | ORAL | Status: DC

## 2022-08-27 MED ORDER — LOPERAMIDE HCL 2 MG PO CAPS: 2.0000 mg | ORAL_CAPSULE | Freq: Four times a day (QID) | ORAL | Status: DC | PRN

## 2022-08-27 NOTE — Progress Notes (Signed)
Initial Nutrition Assessment  DOCUMENTATION CODES:   Severe malnutrition in context of acute illness/injury  INTERVENTION:  Discuss adequate nutrition   Discuss electrolyte replacement   Offer ONS-patient declines at this time    NUTRITION DIAGNOSIS:   Severe Malnutrition related to acute illness as evidenced by energy intake < or equal to 50% for > or equal to 5 days, percent weight loss.  GOAL:   Patient will meet greater than or equal to 90% of their needs  MONITOR:   PO intake, I & O's, Supplement acceptance, Labs, Weight trends, Skin  REASON FOR ASSESSMENT:   Consult Assessment of nutrition requirement/status  ASSESSMENT:   47 y.o. female with PMHx including kidney transplant in 2018, T2DM with neuropathy, GERD, HTN, asthma presents with N/V/D for 1 week and admitted for hypokalemia 2/2 AKI in setting of renal transplant  Patient likely had Salmonella/Campylobacter from eating at [employer].   Patient currently on clear liquid diet   Need new hga1c, last one in 2019?   Labs: K+ 2.2 (K+ 2.6), BUN 50, Cr 2.33, Mag 1.6, Meds: lipitor, magnesium sulfate, klor-con, deltasone, sodium bicarbonate, NS  Wt: 9.7 kg (7.7%) wt loss x 3 months (likely in shorter timeframe)  I/O's:  -275 ml   Visited patient at bedside who had [place] at bedside. She reports having an appetite and just going slow with what she is eating. Patient is beginning to feel better and denies N/V.   She wants to wait before trying an ONS. She endorses 20# wt loss in 1.5 weeks due to not tolerating po intake.    Diet Order:   Diet Order             Diet heart healthy/carb modified Room service appropriate? Yes; Fluid consistency: Thin  Diet effective now                   EDUCATION NEEDS:   Education needs have been addressed  Skin:  Skin Assessment: Reviewed RN Assessment  Last BM:  5/23 type 6  Height:   Ht Readings from Last 1 Encounters:  08/26/22 5' 6.5" (1.689 m)     Weight:   Wt Readings from Last 1 Encounters:  08/26/22 111.1 kg    Ideal Body Weight:     BMI:  Body mass index is 38.95 kg/m.  Estimated Nutritional Needs:   Kcal:  2000-2220  Protein:  111-133 g  Fluid:  >2L    Leodis Rains, RDN, LDN  Clinical Nutrition

## 2022-08-27 NOTE — ED Notes (Signed)
ED TO INPATIENT HANDOFF REPORT  ED Nurse Name and Phone #: Grant Fontana PM [phone]  S Name/Age/Gender Linda Ellison 47 y.o. female Room/Bed: 034C/034C  Code Status   Code Status: Full Code  Home/SNF/Other Home Patient oriented to: self, place, time, and situation Is this baseline? Yes   Triage Complete: Triage complete  Chief Complaint AKI (acute kidney injury) [facility]) [N17.9]  Triage Note Pt presents to ED following abnormal labs K 2.6 at PCP. Seeked appt with PCP d/t concerns for NVD x 1 weeks. Has now begun to feel generalized weakness and lower abdominal cramping. Endorses chills/fevers at home. No sick exposure. No international travel. Currently taking bactrim. No dialysis, kidney transplant 2018.     Allergies Allergies  Allergen Reactions   Ciprofloxacin Hives, Itching and Nausea And Vomiting   Fluocinolone Itching, Nausea And Vomiting and Other (See Comments)   Hydromorphone Hives   Strawberry Extract Itching and Other (See Comments)    Pt states that this medication makes her tongue raw.     Hydromorphone Hcl Hives   Phenytoin Sodium Extended Itching   Tylenol [Acetaminophen] Itching    04/26/18 patient states she does not know why this is in her file because to her knowledge she has never had a reaction to tylenol.   Adhesive [Tape] Itching and Rash   Chlorhexidine Gluconate Itching   Clindamycin Diarrhea, Nausea And Vomiting and Rash   Oxycodone Nausea And Vomiting, Rash and Other (See Comments)    Reaction:  Hallucinations    Shrimp [Shellfish Allergy] Rash    Level of Care/Admitting Diagnosis ED Disposition     ED Disposition  Admit   Condition  --   Comment  Hospital Area: [facility] [[id number]]  Level of Care: Telemetry Medical [[id number]]  May admit patient to [facility] or [facility] if equivalent level of care is available:: No  Covid Evaluation: Confirmed COVID Positive  Diagnosis: AKI (acute kidney injury) [facility]) [[id number]]   Admitting Physician: Therisa Doyne [[id number]]  Attending Physician: Therisa Doyne [[id number]]  Certification:: I certify this patient will need inpatient services for at least 2 midnights  Estimated Length of Stay: 2          B Medical/Surgery History Past Medical History:  Diagnosis Date   Allergy    Anemia associated with chronic renal failure    Anxiety    Asthma    Atypical chest pain    long-standing -- normal cardio cath 06-27-2012 and normal nuclear stress test 06-25-2016   AV (arteriovenous fistula) (HCC)    for dialysis-- currently located left radiocephalic   CAD (coronary artery disease) cardiologist-- dr Sharol Roussel [facility] [place] [facility] in [place])   a. False positive stress echo 06/2012 at [facility] - cath with no obstructive CAD at [facility] (mild luminal irregularities in LAD, moderate diffuse disease in distal RCA).   Depression    Elevated lipids    ESRD on hemodialysis [facility]) NEPHROLOGIST-  DR MATTINGLY   started dialysis 01/29/11: Foye Spurling [facility] on MWF   Gastroparesis    GERD (gastroesophageal reflux disease)    History of pneumonia    HCAP 04/ 2017   Hyperlipidemia    Hyperthyroidism    Menorrhagia    Other secondary hypertension    associated to diabetes -- followed by cardiologist ( dr Sharol Roussel)     Peripheral neuropathy    PONV (postoperative nausea and vomiting)    Pre-transplant evaluation for kidney transplant    [facility]   Renal failure syndrome 03/05/2022   Secondary hyperparathyroidism of renal origin [facility])    Sleep apnea    Type 2 diabetes mellitus, with long-term current use of insulin (HCC)    dx 1985   Past Surgical History:  Procedure Laterality Date   AV FISTULA PLACEMENT  08/2010   Left radiocephalic AVF   AV FISTULA PLACEMENT Right 05/07/2014   Procedure: ARTERIOVENOUS (AV) FISTULA CREATION;  Surgeon: Sherren Kerns, MD;  Location: [facility] OR;  Service: Vascular;  Laterality:  Right;   AV FISTULA PLACEMENT Right 07/02/2014   Procedure: INSERTION OF ARTERIOVENOUS (AV) GORE-TEX GRAFT ARM;  Surgeon: Sherren Kerns, MD;  Location: [facility] OR;  Service: Vascular;  Laterality: Right;   CARDIOVASCULAR STRESS TEST  06/25/2016   [facility]   normal nuclear perfusion study w/ no ischemia/  normal LV function and wall motion , ef 51%   CESAREAN SECTION  03/22/2001   w/ Bilateral Tubal Ligation   COLONOSCOPY     COLONOSCOPY WITH ESOPHAGOGASTRODUODENOSCOPY (EGD)  10/19/2011   DILATION AND CURETTAGE OF UTERUS  05/12/2000   w/ suction for missed ab   DOBUTAMINE STRESS ECHO  06/04/2016    [facility]   normal stress echo w/ no chest pain or ischemia/  normal LV function and wall motion , stress ef 60-65%   ESOPHAGOGASTRODUODENOSCOPY  10/19/2011   Dr. Stan Head   FEMUR IM NAIL Left child   removed   FOOT SURGERY Left 2014 approx.   HYSTEROSCOPY WITH NOVASURE N/A 08/19/2016   Procedure: HYSTEROSCOPY WITH NOVASURE;  Surgeon: Huel Cote, MD;  Location: [facility];  Service: Gynecology;  Laterality: N/A;   KIDNEY TRANSPLANT  09/18/2016   LAPAROSCOPIC CHOLECYSTECTOMY  1994   LEFT HEART CATHETERIZATION WITH CORONARY ANGIOGRAM N/A 06/27/2012   Procedure: LEFT HEART CATHETERIZATION WITH CORONARY ANGIOGRAM;  Surgeon: Laurey Morale, MD;  Location: [facility] CATH LAB;  Service: Cardiovascular;  Laterality: N/A; No ostructive CAD, normal LVF, ef 55-60% (mild LAD luminal irregularities, moderate dRCA diffuse disease)   LIGATION GORETEX FISTULA  01/04/11   Left AVF   LIGATION OF ARTERIOVENOUS  FISTULA Left 08/21/2014   Procedure: LIGATION OF ARTERIOVENOUS  FISTULA;  Surgeon: Annice Needy, MD;  Location: [facility] ORS;  Service: Vascular;  Laterality: Left;   LIGATION OF ARTERIOVENOUS  FISTULA Left 06/29/2017   Procedure: LIGATION OF ARTERIOVENOUS  FISTULA ( RADIOCEPHALIC );  Surgeon: Annice Needy, MD;  Location: [facility] ORS;  Service: Vascular;  Laterality: Left;   NEPHRECTOMY TRANSPLANTED ORGAN      PERIPHERAL VASCULAR CATHETERIZATION Left 08/08/2014   Procedure: Upper Extremity Angiography;  Surgeon: Annice Needy, MD;  Location: [facility] INVASIVE CV LAB;  Service: Cardiovascular;  Laterality: Left;   PERIPHERAL VASCULAR CATHETERIZATION Left 08/08/2014   Procedure: Upper Extremity Intervention;  Surgeon: Annice Needy, MD;  Location: [facility] INVASIVE CV LAB;  Service: Cardiovascular;  Laterality: Left;   PERIPHERAL VASCULAR CATHETERIZATION Left 03/08/2016   Procedure: A/V Fistulagram;  Surgeon: Annice Needy, MD;  Location: [facility] INVASIVE CV LAB;  Service: Cardiovascular;  Laterality: Left;   POLYPECTOMY     THROMBECTOMY AND REVISION OF ARTERIOVENTOUS (AV) GORETEX  GRAFT Right 07/16/2014   Procedure: THROMBECTOMY  Right  arm  ARTERIOVENOUS  GORETEX  GRAFT;  Surgeon: Sherren Kerns, MD;  Location: [facility] OR;  Service: Vascular;  Laterality: Right;   TRANSTHORACIC ECHOCARDIOGRAM  06/04/2016   mild concentric LVH, ef 55-60%,  mild TR,  borderline LAE,  trivial MR and PR  A IV Location/Drains/Wounds Patient Ellison/Drains/Airways Status     Active Line/Drains/Airways     Name Placement date Placement time Site Days   Peripheral IV 08/26/22 22 G 1" Right Forearm 08/26/22  --  Forearm  1   Vascular Access Left Forearm Arteriovenous fistula --  --  Forearm  --            Intake/Output Last 24 hours No intake or output data in the 24 hours ending 08/27/22 0009  Labs/Imaging Results for orders placed or performed during the hospital encounter of 08/26/22 (from the past 48 hour(s))  Lipase, blood     Status: None   Collection Time: 08/26/22  8:03 PM  Result Value Ref Range   Lipase 21 11 - 51 U/L    Comment: Performed at [facility] Lab, [address], [place], [place] [address]  Lactic acid, plasma     Status: None   Collection Time: 08/26/22  8:03 PM  Result Value Ref Range   Lactic Acid, Venous 1.1 0.5 - 1.9 mmol/L    Comment: Performed at [facility] Lab, [address],  [place], [place] [address]  Comprehensive metabolic panel     Status: Abnormal   Collection Time: 08/26/22  8:03 PM  Result Value Ref Range   Sodium 136 135 - 145 mmol/L   Potassium 2.3 (LL) 3.5 - 5.1 mmol/L    Comment: REPEATED TO VERIFY CRITICAL RESULT CALLED TO, READ BACK BY AND VERIFIED WITH Sondra Barges, RN, 2128, 08/26/22, EADEDOKUN    Chloride 88 (L) 98 - 111 mmol/L   CO2 29 22 - 32 mmol/L   Glucose, Bld 83 70 - 99 mg/dL    Comment: Glucose reference range applies only to samples taken after fasting for at least 8 hours.   BUN 62 (H) 6 - 20 mg/dL   Creatinine, Ser 2.44 (H) 0.44 - 1.00 mg/dL   Calcium 7.7 (L) 8.9 - 10.3 mg/dL   Total Protein 7.9 6.5 - 8.1 g/dL   Albumin 4.0 3.5 - 5.0 g/dL   AST 21 15 - 41 U/L   ALT 23 0 - 44 U/L   Alkaline Phosphatase 104 38 - 126 U/L   Total Bilirubin 0.8 0.3 - 1.2 mg/dL   GFR, Estimated 16 (L) >60 mL/min    Comment: (NOTE) Calculated using the CKD-EPI Creatinine Equation (2021)    Anion gap 19 (H) 5 - 15    Comment: Performed at [facility] Lab, [address], [place], [place] [address]  CBC with Differential     Status: Abnormal   Collection Time: 08/26/22  8:03 PM  Result Value Ref Range   WBC 9.3 4.0 - 10.5 K/uL   RBC 4.93 3.87 - 5.11 MIL/uL   Hemoglobin 12.5 12.0 - 15.0 g/dL   HCT 25.3 66.4 - 40.3 %   MCV 79.9 (L) 80.0 - 100.0 fL   MCH 25.4 (L) 26.0 - 34.0 pg   MCHC 31.7 30.0 - 36.0 g/dL   RDW 47.4 25.9 - 56.3 %   Platelets 319 150 - 400 K/uL   nRBC 0.0 0.0 - 0.2 %   Neutrophils Relative % 73 %   Neutro Abs 6.8 1.7 - 7.7 K/uL   Lymphocytes Relative 15 %   Lymphs Abs 1.4 0.7 - 4.0 K/uL   Monocytes Relative 10 %   Monocytes Absolute 0.9 0.1 - 1.0 K/uL   Eosinophils Relative 1 %   Eosinophils Absolute 0.1 0.0 - 0.5 K/uL  Basophils Relative 0 %   Basophils Absolute 0.0 0.0 - 0.1 K/uL   Immature Granulocytes 1 %   Abs Immature Granulocytes 0.05 0.00 - 0.07 K/uL    Comment: Performed at [facility], [address],  [place], [place] [address]  Magnesium     Status: Abnormal   Collection Time: 08/26/22  8:03 PM  Result Value Ref Range   Magnesium 1.2 (L) 1.7 - 2.4 mg/dL    Comment: Performed at [facility], [address], [place], [place] [address]  Urinalysis, Routine w reflex microscopic -Urine, Clean Catch     Status: Abnormal   Collection Time: 08/26/22  8:08 PM  Result Value Ref Range   Color, Urine YELLOW YELLOW   APPearance HAZY (A) CLEAR   Specific Gravity, Urine 1.013 1.005 - 1.030   pH 6.0 5.0 - 8.0   Glucose, UA NEGATIVE NEGATIVE mg/dL   Hgb urine dipstick NEGATIVE NEGATIVE   Bilirubin Urine NEGATIVE NEGATIVE   Ketones, ur NEGATIVE NEGATIVE mg/dL   Protein, ur NEGATIVE NEGATIVE mg/dL   Nitrite NEGATIVE NEGATIVE   Leukocytes,Ua TRACE (A) NEGATIVE   RBC / HPF 0-5 0 - 5 RBC/hpf   WBC, UA 0-5 0 - 5 WBC/hpf   Bacteria, UA RARE (A) NONE SEEN   Squamous Epithelial / HPF 6-10 0 - 5 /HPF   Hyaline Casts, UA PRESENT     Comment: Performed at [facility], [address] [place], [place] [address]  CBG monitoring, ED     Status: None   Collection Time: 08/26/22  8:10 PM  Result Value Ref Range   Glucose-Capillary 71 70 - 99 mg/dL    Comment: Glucose reference range applies only to samples taken after fasting for at least 8 hours.  I-Stat beta hCG blood, ED     Status: None   Collection Time: 08/26/22  8:11 PM  Result Value Ref Range   I-stat hCG, quantitative <5.0 <5 mIU/mL   Comment 3            Comment:   GEST. AGE      CONC.  (mIU/mL)   <=1 WEEK        5 - 50     2 WEEKS       50 - 500     3 WEEKS       100 - 10,000     4 WEEKS     1,000 - 30,000        FEMALE AND NON-PREGNANT FEMALE:     LESS THAN 5 mIU/mL    CT ABDOMEN PELVIS WO CONTRAST  Result Date: 08/27/2022 CLINICAL DATA:  Left lower quadrant pain. EXAM: CT ABDOMEN AND PELVIS WITHOUT CONTRAST TECHNIQUE: Multidetector CT imaging of the abdomen and pelvis was performed following the standard protocol without IV  contrast. RADIATION DOSE REDUCTION: This exam was performed according to the departmental dose-optimization program which includes automated exposure control, adjustment of the mA and/or kV according to patient size and/or use of iterative reconstruction technique. COMPARISON:  April 07, 2016 FINDINGS: Lower chest: No acute abnormality. Hepatobiliary: No focal liver abnormality is seen. Status post cholecystectomy. No biliary dilatation. Pancreas: Unremarkable. No pancreatic ductal dilatation or surrounding inflammatory changes. Spleen: Normal in size without focal abnormality. Adrenals/Urinary Tract: Adrenal glands are unremarkable. The native kidneys are atrophic in appearance, without renal calculi, focal lesion, or hydronephrosis. A normal appearing transplanted kidney is seen within the pelvis on the right bladder is unremarkable.  Stomach/Bowel: Stomach is within normal limits. Appendix appears normal. No evidence of bowel wall thickening, distention, or inflammatory changes. Vascular/Lymphatic: Aortic atherosclerosis. No enlarged abdominal or pelvic lymph nodes. Reproductive: The uterus and right adnexa are unremarkable. A cluster of ill-defined, subcentimeter cysts are seen within the left ovary. Other: No abdominal wall hernia or abnormality. No abdominopelvic ascites. Musculoskeletal: No acute or significant osseous findings. IMPRESSION: 1. Cluster of ill-defined, subcentimeter cysts within the left ovary. No follow-up imaging is recommended. This recommendation follows ACR consensus guidelines: White Paper of the ACR Incidental Findings Committee II on Adnexal Findings. J Am Coll Radiol 2013:10:675-681. 2. Evidence of prior cholecystectomy. 3. Normal appearing transplanted kidney within the pelvis on the right. 4. Aortic atherosclerosis. Aortic Atherosclerosis (ICD10-I70.0). Electronically Signed   By: Aram Candela M.D.   On: 08/27/2022 00:03    Pending Labs Unresulted Labs (From admission,  onward)     Start     Ordered   08/27/22 0500  Prealbumin  Tomorrow morning,   R        08/26/22 2300   08/27/22 0200  Basic metabolic panel  Once,   R        08/26/22 2331   08/26/22 2324  Creatinine, urine, random  Once,   R        08/26/22 2323   08/26/22 2324  Osmolality, urine  Once,   R        08/26/22 2323   08/26/22 2324  Osmolality  Add-on,   AD        08/26/22 2323   08/26/22 2324  Sodium, urine, random  Once,   R        08/26/22 2323   08/26/22 2301  CK  Add-on,   AD        08/26/22 2300   08/26/22 2301  Phosphorus  Add-on,   AD        08/26/22 2300   08/26/22 2301  TSH  Add-on,   AD        08/26/22 2300   08/26/22 2156  Lactic acid, plasma  Now then every 2 hours,   STAT        08/26/22 2152   08/26/22 2111  Gastrointestinal Panel by PCR , Stool  (Gastrointestinal Panel by PCR, Stool                                                                                                                                                     **Does Not include CLOSTRIDIUM DIFFICILE testing. **If CDIFF testing is needed, place order from the "C Difficile Testing" order set.**)  Once,   URGENT       Question:  Patient immune status  Answer:  Immunocompromised   08/26/22 2110   08/26/22 2111  C Difficile Quick Screen w PCR reflex  (C Difficile  quick screen w PCR reflex panel )  Once, for 24 hours,   URGENT       References:    CDiff Information Tool   08/26/22 2110   Signed and Held  HIV Antibody (routine testing w rflx)  (HIV Antibody (Routine testing w reflex) panel)  Once,   R        Signed and Held   Signed and Held  Magnesium  Tomorrow morning,   R        Signed and Held   Signed and Held  Phosphorus  Tomorrow morning,   R        Signed and Held   Signed and Held  Comprehensive metabolic panel  Tomorrow morning,   R       Question:  Release to patient  Answer:  Immediate   Signed and Held   Signed and Held  CBC  Tomorrow morning,   R       Question:  Release to patient   Answer:  Immediate   Signed and Held            Vitals/Pain Today's Vitals   08/26/22 2100 08/26/22 2130 08/26/22 2200 08/26/22 2315  BP: (!) 153/92 (!) 143/87 (!) 143/82 (!) 145/88  Pulse: 85 87 86 88  Resp: (!) 21 16 (!) 22 18  Temp:      TempSrc:      SpO2: 98% 94% 95% 94%  Weight:      Height:      PainSc:        Isolation Precautions Enteric precautions (UV disinfection)  Medications Medications  potassium chloride 10 mEq in 100 mL IVPB (10 mEq Intravenous New Bag/Given 08/26/22 2318)  magnesium sulfate IVPB 2 g 50 mL (2 g Intravenous New Bag/Given 08/26/22 2318)  tacrolimus (PROGRAF) capsule 4-5 mg (has no administration in time range)  insulin pump (has no administration in time range)  lactated ringers bolus 2,000 mL (2,000 mLs Intravenous New Bag/Given 08/26/22 2317)    Mobility walks     Focused Assessments Renal Assessment Handoff:  Hemodialysis Schedule:  Last Hemodialysis date and time:    Restricted appendage:     R Recommendations: See Admitting Provider Note  Report given to:   Additional Notes:  Former hemodialysis patient, kidney transplant in 2018. Decline in kidney function.

## 2022-08-27 NOTE — Progress Notes (Signed)
PROGRESS NOTE  Linda Ellison  DOB: 27-Sep-1975  PCP: Daylene Katayama, [place] WUJ:[id number]  DOA: 08/26/2022  LOS: 1 day  Hospital Day: 2  Brief narrative: Linda Ellison is a 47 y.o. female with PMH significant for ESRD due to diabetic nephropathy now s/p deceased donor renal transplant 10/2016 complicated by renal artery stenosis in 2019 s/p angioplasty and stent of right renal artery; also with DM2, HTN, HLD, obesity, OSA, CAD, hypothyroidism, peripheral neuropathy, gastroparesis, chronic anemia, anxiety/depression 2 weeks prior, patient and her significant other were in a cookout.  Following that, both of them started having nausea, vomiting, diarrhea averaging 5-10 episodes of diarrhea a day.  Her significant other was hospitalized and noted to have Salmonella and Campylobacter.  Patient also had lower abdominal pain and generalized weakness but her symptoms were less pronounced and hence she did not need hospitalization.  She however reports she has not been able to maintain hydration because of abdominal cramping and diarrhea. 5/22, patient was seen at the nephrologist's office.  Labs showed potassium low at 2.6 and creatinine elevated and hence sent to the ED.  In the ED, afebrile, heart rate in 80s, blood pressure 121/83 Initial labs with CBC unremarkable, CMP with potassium low at 2.3, magnesium low at 1.2, BUN/creatinine elevated to 62/3.46, lactic acid 1.1, CT abdomen pelvis did not show any acute intra-abdominal pathology. Patient was started on IV hydration Admitted to [facility] Nephrology consulted  Subjective: Patient was seen and examined this morning.  Pleasant obese African-American female.  Walking back from bathroom to the bed independently. Not in distress.  Had regular bowel movement this morning.  Vomiting and diarrhea had stopped prior to presentation Nephrology follow-up appreciated Chart reviewed. Overnight, afebrile, blood pressure in 140s mostly Most recent labs from  this morning with potassium low at 2.2, magnesium low at 1.6 BUN/creatinine improving but still elevated at 50/2.33  Assessment and plan: AKI on CKD Baseline creatinine normal.  Presented with creatinine elevated to 3.46 due to dehydration from GI loss. Gradually improving creatinine on IV fluid.  Continue to monitor. Continue sodium bicarb twice daily Nephrology following.   Recent Labs    08/26/22 2003 08/27/22 0345 08/27/22 1252  BUN 62* 50* 39*  CREATININE 3.46* 2.33* 1.81*   Renal transplant status 10/2016 PTA on immunosuppression with Myfortic twice daily,, Prograf twice daily, and prednisone 5 mg daily.  Also on Bactrim 3 times a week Currently continued on Prograf and prednisone. Myfortic and Bactrim are on hold. CT abdomen showed normal-appearing transplant kidney on the right pelvis. Noted plan from nephrology to obtain ultrasound duplex of transplant kidney.  Hypokalemia/hypomagnesemia Significantly low potassium and magnesium level.  Replacement given by nephrology. Repeat potassium this afternoon is at 2.9.  Every 4 hours replacement for next 3 doses IV magnesium given this morning Recent Labs  Lab 08/26/22 2003 08/27/22 0345 08/27/22 1252  K 2.3* 2.2* 2.9*  MG 1.2* 1.6*  --   PHOS 4.6 3.7  --    Essential hypertension Home list shows multiple medication including clonidine 0.3 mg 3 times daily, labetalol 400 mg 3 times daily, prazosin 1 mg daily, amlodipine 10 mg daily, chlorthalidone 25 mg daily, Bumex 2 mg twice daily??, Currently on labetalol 400 mg daily only. Blood pressure in 140s. Continue to monitor  Type 2 diabetes mellitus uncontrolled Diabetic neuropathy A1c 9.9 from 2019.  Repeat A1c PTA on Trulicity weekly, insulin pump.  Patient is able to tell the basal rate of insulin.  She has it  currently on. Will obtain diabetic coordinator consult. Neurontin nightly as needed Lab Results  Component Value Date   HGBA1C 9.9 (H) 11/21/2017   Recent Labs   Lab 08/26/22 2010 08/27/22 0025 08/27/22 0412 08/27/22 0734 08/27/22 1118  GLUCAP 71 89 101* 83 135*   CAD/HLD PTA on Lipitor 10 mg daily,  Chronic anemia GERD Continue PPI, Recent Labs    08/26/22 2003 08/27/22 0345  HGB 12.5 11.6*  MCV 79.9* 79.9*   Hypothyroidism I do not see Synthroid in her home list.  Obtain TSH level  Morbid Obesity  Body mass index is 38.95 kg/m. Patient has been advised to make an attempt to improve diet and exercise patterns to aid in weight loss.  OSA  COPD Bronchodilators  Anxiety/depression PTA on Abilify 30 mg daily, bupropion daily, Atarax nightly, Continue the same.   Mobility: Encourage ambulation  Goals of care   Code Status: Full Code     DVT prophylaxis:  SCDs Start: 08/27/22 0115   Antimicrobials: None currently Fluid: NS at 125 mill per hour to continue Consultants: Nephrology Family Communication: None at bedside  Status: Inpatient Level of care:  Telemetry Medical   Patient from: Home Anticipated d/c to: Home Needs to continue in-hospital care:  Continues to need IV fluid for now.    Diet:  Diet Order             Diet heart healthy/carb modified Room service appropriate? Yes; Fluid consistency: Thin  Diet effective now                   Scheduled Meds:  ARIPiprazole  30 mg Oral Daily   atorvastatin  10 mg Oral Daily   buPROPion  450 mg Oral Daily   hydrOXYzine  25 mg Oral QHS   insulin pump   Subcutaneous Q4H   labetalol  400 mg Oral TID   montelukast  10 mg Oral QHS   potassium chloride  40 mEq Oral Q4H   predniSONE  5 mg Oral Q breakfast   sodium bicarbonate  1,300 mg Oral BID   tacrolimus  4 mg Oral QHS   tacrolimus  5 mg Oral Daily    PRN meds: fentaNYL (SUBLIMAZE) injection, gabapentin, mouth rinse   Infusions:   sodium chloride       Antimicrobials: Anti-infectives (From admission, onward)    None       Nutritional status:  Body mass index is 38.95 kg/m.           Objective: Vitals:   08/27/22 0411 08/27/22 0743  BP: (!) 146/74 (!) 141/75  Pulse: 93 92  Resp: 20 18  Temp: 99.1 F (37.3 C) 98.5 F (36.9 C)  SpO2: 95% 98%    Intake/Output Summary (Last 24 hours) at 08/27/2022 1350 Last data filed at 08/27/2022 1100 Gross per 24 hour  Intake 625.24 ml  Output 1400 ml  Net -774.76 ml   Filed Weights   08/26/22 1950  Weight: 111.1 kg   Weight change:  Body mass index is 38.95 kg/m.   Physical Exam: General exam: Middle-aged African-American female. Skin: No rashes, lesions or ulcers. HEENT: Atraumatic, normocephalic, no obvious bleeding Lungs: Clear to auscultation bilaterally CVS: Regular rate and rhythm, no murmur GI/Abd soft, nontender, nondistended, bowel sound present CNS: Alert, awake, oriented x 3 Psychiatry: Mood appropriate Extremities: Chronic puffy legs, no calf tenderness  Data Review: I have personally reviewed the laboratory data and studies available.  F/u labs ordered [employer] (From  admission, onward)     Start     Ordered   08/28/22 0500  CBC with Differential/Platelet  Daily,   R     Question:  Specimen collection method  Answer:  Lab=Lab collect   08/27/22 0942   08/28/22 0500  Basic metabolic panel  Daily,   R     Question:  Specimen collection method  Answer:  Lab=Lab collect   08/27/22 0942   08/27/22 1348  TSH  Add-on,   AD       Question:  Specimen collection method  Answer:  Lab=Lab collect   08/27/22 1347   08/27/22 0944  Hemoglobin A1c  Add-on,   AD       Question:  Specimen collection method  Answer:  Lab=Lab collect   08/27/22 0943   08/26/22 2324  Creatinine, urine, random  Once,   R        08/26/22 2323   08/26/22 2324  Osmolality, urine  Once,   R        08/26/22 2323   08/26/22 2324  Sodium, urine, random  Once,   R        08/26/22 2323   08/26/22 2111  Gastrointestinal Panel by PCR , Stool  (Gastrointestinal Panel by PCR, Stool                                                                                                                                                      **Does Not include CLOSTRIDIUM DIFFICILE testing. **If CDIFF testing is needed, place order from the "C Difficile Testing" order set.**)  Once,   URGENT       Question:  Patient immune status  Answer:  Immunocompromised   08/26/22 2110            Total time spent in review of labs and imaging, patient evaluation, formulation of plan, documentation and communication with family: 55 minutes  Signed, Lorin Glass, MD [facility] 08/27/2022

## 2022-08-27 NOTE — Plan of Care (Signed)
  Problem: Education: Goal: Knowledge of General Education information will improve Description: Including pain rating scale, medication(s)/side effects and non-pharmacologic comfort measures Outcome: Completed/Met   

## 2022-08-27 NOTE — Progress Notes (Signed)
New Admission Note:  Arrival Method: Stretcher Mental Orientation: Alert and oriented x4 Telemetry: Box 11 Assessment: Completed Skin: Warm and and dry IV: NSL  Pain: Denies Tubes: Insulin pump Safety Measures: Safety Fall Prevention Plan initiated.  Admission: Completed 5 M  Orientation: Patient has been orientated to the room, unit and the staff. Welcome booklet given.  Family: N/A  Orders have been reviewed and implemented. Will continue to monitor the patient. Call light has been placed within reach and bed alarm has been activated.   Guilford Shi BSN, RN  Phone Number: [phone]

## 2022-08-27 NOTE — Progress Notes (Signed)
O2 sat 97-100 percent on R/A while ambulating, denies any SOB, no distress noted  Margarita Grizzle

## 2022-08-27 NOTE — Inpatient Diabetes Management (Signed)
Inpatient Diabetes Program Recommendations  AACE/ADA: New Consensus Statement on Inpatient Glycemic Control (2015)  Target Ranges:  Prepandial:   less than 140 mg/dL      Peak postprandial:   less than 180 mg/dL (1-2 hours)      Critically ill patients:  140 - 180 mg/dL    Latest Reference Range & Units 08/26/22 20:10 08/27/22 00:25 08/27/22 04:12 08/27/22 07:34  Glucose-Capillary 70 - 99 mg/dL 71 89 147 (H) 83  (H): Data is abnormally high   Admit with: N/V/D + Hypokalemia/ Acute Kidney Injury/ Dehydration  History: DM, Kidney Transplant  Home DM Meds: Insulin Pump        Trulicity 4.5 mg Qweek  Current Orders: Insulin Pump Q4 hours    ENDO: Dr. Allena Katz with [facility] Last Seen 03/10/2022 A1c was 7.2% at that visit [place] Insulin Pump + Dexcom CGM  Called pt by phone this AM.  This Diabetes RN is working on [employer] today and assisting with [facility] patients.  Pt A&O and able to answer all my questions.  Last site change was Tuesday, 05/21.  Pt told me she plans to change her site/tubing/ insulin/reservoir today--Needs to call family to bring pump supplies.  I asked pt what her basal settings, carb ratio, and correction factor are, but pt only able to tell me her Max bolus is 20 units.  She did not know how to navigate her pump to review the current settings.  I have called pt's ENDO office to see if office rep can review most recent settings with me.  I called RN caring for pt earlier this AM and reviewed documentation specifics for pts with insulin pumps.     --Will follow patient during hospitalization--  Ambrose Finland RN, MSN, CDCES Diabetes Coordinator Inpatient Glycemic Control Team Team Pager: [phone] (8a-5p)

## 2022-08-27 NOTE — Consult Note (Addendum)
[facility] Renal Consultation Note  Requesting MD: Dr. Adela Glimpse Indication for Consultation: AKI in setting of renal transplant   HPI:  Linda Ellison is a 47 y.o. 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 Patient reports she and a family member was at a cookout and ate the same food. Afterward, her family member was hospitalized for Salmonella and Campylobacter. Patient reports her symptoms all started around the same time. She has not noticed any blood in her stool. She has been taking all of her medications, including prednisone, tacrolimus, mycophenolate, and Bactrim. Her nephrologist is Dr. Thedore Mins with [facility] and she sees the transplant clinic at [facility]. During last follow-up with [facility] in 9/26, found to have findings suggestive of stenosis of transplant renal artery. She was subsequently hospitalized and underwent angiography 01/04/22, found to have mild proximal renal artery stenosis, treated with angioplasty. Last lab work before this acute illness, patient's creatinine 0.94 01/2022.   PMHx:   Past Medical History:  Diagnosis Date   Allergy    Anemia associated with chronic renal failure    Anxiety    Asthma    Atypical chest pain    long-standing -- normal cardio cath 06-27-2012 and normal nuclear stress test 06-25-2016   AV (arteriovenous fistula) (HCC)    for dialysis-- currently located left radiocephalic   CAD (coronary artery disease) cardiologist-- dr Sharol Roussel [facility] cardiology in [place])   a. False positive stress echo 06/2012 at [facility] - cath with no obstructive  CAD at [facility] (mild luminal irregularities in LAD, moderate diffuse disease in distal RCA).   Depression    Elevated lipids    ESRD on hemodialysis [facility]) NEPHROLOGIST-  DR MATTINGLY   started dialysis 01/29/11: Foye Spurling [facility] on MWF   Gastroparesis    GERD (gastroesophageal reflux disease)    History of pneumonia    HCAP 04/ 2017   Hyperlipidemia    Hyperthyroidism    Menorrhagia    Other secondary hypertension    associated to diabetes -- followed by cardiologist ( dr Sharol Roussel)     Peripheral neuropathy    PONV (postoperative nausea and vomiting)    Pre-transplant evaluation for kidney transplant    [facility]   Renal failure syndrome 03/05/2022   Secondary hyperparathyroidism of renal origin [facility])    Sleep apnea    Type 2 diabetes mellitus, with long-term current use of insulin (HCC)    dx 1985    Past Surgical History:  Procedure Laterality Date   AV FISTULA PLACEMENT  08/2010   Left radiocephalic AVF   AV FISTULA PLACEMENT Right 05/07/2014   Procedure: ARTERIOVENOUS (AV) FISTULA CREATION;  Surgeon: Sherren Kerns, MD;  Location[facility] OR;  Service: Vascular;  Laterality: Right;   AV FISTULA PLACEMENT Right 07/02/2014   Procedure: INSERTION OF ARTERIOVENOUS (AV) GORE-TEX GRAFT ARM;  Surgeon: Sherren Kerns, MD;  [facility]: [facility] OR;  Service: Vascular;  Laterality: Right;   CARDIOVASCULAR STRESS TEST  06/25/2016   [facility]   normal nuclear perfusion study w/ no ischemia/  normal LV function and wall motion , ef 51%   CESAREAN SECTION  03/22/2001   w/ Bilateral Tubal  Ligation   COLONOSCOPY     COLONOSCOPY WITH ESOPHAGOGASTRODUODENOSCOPY (EGD)  10/19/2011   DILATION AND CURETTAGE OF UTERUS  05/12/2000   w/ suction for missed ab   DOBUTAMINE STRESS ECHO  06/04/2016    [facility]   normal stress echo w/ no chest pain or ischemia/  normal LV function and wall motion , stress ef 60-65%   ESOPHAGOGASTRODUODENOSCOPY  10/19/2011   Dr. Stan Head   FEMUR IM NAIL Left child   removed   FOOT SURGERY Left 2014 approx.   HYSTEROSCOPY WITH NOVASURE N/A 08/19/2016   Procedure: HYSTEROSCOPY WITH NOVASURE;  Surgeon: Huel Cote, MD;  Location: [facility];  Service: Gynecology;  Laterality: N/A;   KIDNEY TRANSPLANT  09/18/2016   LAPAROSCOPIC CHOLECYSTECTOMY  1994   LEFT HEART CATHETERIZATION WITH CORONARY ANGIOGRAM N/A 06/27/2012   Procedure: LEFT HEART CATHETERIZATION WITH CORONARY ANGIOGRAM;  Surgeon: Laurey Morale, MD;  Location: [facility] CATH LAB;  Service: Cardiovascular;  Laterality: N/A; No ostructive CAD, normal LVF, ef 55-60% (mild LAD luminal irregularities, moderate dRCA diffuse disease)   LIGATION GORETEX FISTULA  01/04/11   Left AVF   LIGATION OF ARTERIOVENOUS  FISTULA Left 08/21/2014   Procedure: LIGATION OF ARTERIOVENOUS  FISTULA;  Surgeon: Annice Needy, MD;  Location: [facility] ORS;  Service: Vascular;  Laterality: Left;   LIGATION OF ARTERIOVENOUS  FISTULA Left 06/29/2017   Procedure: LIGATION OF ARTERIOVENOUS  FISTULA ( RADIOCEPHALIC );  Surgeon: Annice Needy, MD;  Location: [facility] ORS;  Service: Vascular;  Laterality: Left;   NEPHRECTOMY TRANSPLANTED ORGAN     PERIPHERAL VASCULAR CATHETERIZATION Left 08/08/2014   Procedure: Upper Extremity Angiography;  Surgeon: Annice Needy, MD;  Location: [facility] INVASIVE CV LAB;  Service: Cardiovascular;  Laterality: Left;   PERIPHERAL VASCULAR CATHETERIZATION Left 08/08/2014   Procedure: Upper Extremity Intervention;  Surgeon: Annice Needy, MD;  Location: [facility] INVASIVE CV LAB;  Service: Cardiovascular;  Laterality: Left;   PERIPHERAL VASCULAR CATHETERIZATION Left 03/08/2016   Procedure: A/V Fistulagram;  Surgeon: Annice Needy, MD;  Location: [facility] INVASIVE CV LAB;  Service: Cardiovascular;  Laterality: Left;   POLYPECTOMY     THROMBECTOMY AND REVISION OF ARTERIOVENTOUS (AV) GORETEX  GRAFT Right 07/16/2014   Procedure: THROMBECTOMY  Right  arm  ARTERIOVENOUS  GORETEX  GRAFT;  Surgeon:  Sherren Kerns, MD;  Location: [facility] OR;  Service: Vascular;  Laterality: Right;   TRANSTHORACIC ECHOCARDIOGRAM  06/04/2016   mild concentric LVH, ef 55-60%,  mild TR,  borderline LAE,  trivial MR and PR   Family Hx:  Family History  Problem Relation Age of Onset   Heart disease Mother        heart attack at 67 y/o-infected valve   Kidney disease Mother        was a dialysis patient   Diabetes Father    Hypertension Father    Heart disease Brother    Arthritis Brother    Diabetes Maternal Aunt    Hypertension Maternal Aunt    Kidney failure Paternal Aunt    Diabetes Paternal Aunt    Heart disease Paternal Aunt    Hypertension Paternal Aunt    Kidney failure Paternal Uncle    Kidney disease Maternal Grandmother        pre-dialysis   Colon cancer Neg Hx    Colon polyps Neg Hx    Crohn's disease Neg Hx    Esophageal cancer Neg Hx    Rectal cancer Neg Hx  Stomach cancer Neg Hx    Ulcerative colitis Neg Hx    Social History:  reports that she has never smoked. She has never used smokeless tobacco. She reports that she does not drink alcohol and does not use drugs.  Allergies:  Allergies  Allergen Reactions   Ciprofloxacin Hives, Itching and Nausea And Vomiting   Fluocinolone Itching, Nausea And Vomiting and Other (See Comments)   Hydromorphone Hives   Strawberry Extract Itching and Other (See Comments)    Pt states that this medication makes her tongue raw.   No reaction   Hydromorphone Hcl Hives   Phenytoin Sodium Extended Itching   Tylenol [Acetaminophen] Itching    04/26/18 patient states she does not know why this is in her file because to her knowledge she has never had a reaction to tylenol.  No reaction pls remove   Adhesive [Tape] Itching and Rash   Chlorhexidine Gluconate Itching   Clindamycin Diarrhea, Nausea And Vomiting and Rash   Oxycodone Nausea And Vomiting, Rash and Other (See Comments)    Reaction:  Hallucinations    Shrimp [Shellfish Allergy] Rash    Medications: Prior to Admission medications   Medication Sig Start Date End Date Taking? Authorizing Provider  albuterol (PROVENTIL HFA;VENTOLIN HFA) 108 (90 Base) MCG/ACT inhaler Inhale 2 puffs into the lungs every 6 (six) hours as needed for wheezing or shortness of breath. 11/25/17  Yes Nwoko, Agnes I, NP  amLODipine (NORVASC) 10 MG tablet Take 1 tablet by mouth daily. 02/11/22  Yes [provider]  ARIPiprazole (ABILIFY) 30 MG tablet Take 30 mg by mouth daily. 01/26/22  Yes [provider]  atorvastatin (LIPITOR) 10 MG tablet Take 10 mg by mouth daily. 02/08/22  Yes [provider]  bumetanide (BUMEX) 2 MG tablet Take 1 tablet by mouth 2 (two) times daily. 08/06/21  Yes [provider]  buPROPion (WELLBUTRIN XL) 150 MG 24 hr tablet Take 1 tablet (150 mg total) by mouth daily after breakfast. For depression Patient taking differently: Take 150 mg by mouth daily after breakfast. Take with 300mg  tablet for a total daily dose of 450mg  for depression 11/26/17  Yes Nwoko, Agnes I, NP  buPROPion (WELLBUTRIN XL) 300 MG 24 hr tablet Take 300 mg by mouth daily.   Yes [provider]  cetirizine (ZYRTEC) 10 MG tablet Take 10 mg by mouth 2 (two) times daily.   Yes [provider]  chlorthalidone (HYGROTON) 25 MG tablet Take 25 mg by mouth daily. 07/16/22  Yes [provider]  cinacalcet (SENSIPAR) 30 MG tablet Take 30 mg by mouth daily. 03/10/22  Yes [provider]  citalopram (CELEXA) 40 MG tablet Take 40 mg by mouth daily.   Yes [provider]  cloNIDine (CATAPRES) 0.3 MG tablet Take 0.3 mg by mouth 3 (three) times daily.    Yes [provider]  Dulaglutide 4.5 MG/0.5ML SOPN Inject 4.5 mg into the skin once a week. Every Tuesday 12/09/21  Yes [provider]  gabapentin (NEURONTIN) 300 MG capsule Take 300 mg by mouth at bedtime as needed. pain   Yes [provider]  hydrOXYzine (ATARAX) 25 MG tablet  Take 25 mg by mouth at bedtime.   Yes [provider]  insulin aspart (NOVOLOG) 100 UNIT/ML injection USE UP TO 100 UNITS ONCE DAILY IN INSULIN PUMP. DX CODE E11.42 03/10/22  Yes [provider]  Insulin Human (INSULIN PUMP) SOLN Inject 1 each into the skin See admin instructions.   Yes  [provider]  labetalol (NORMODYNE) 200 MG tablet Take 2 tablets (400 mg total) by mouth 3 (three) times daily. For high blood pressure 11/25/17  Yes Nwoko, Agnes I, NP  montelukast (SINGULAIR) 10 MG tablet Take 10 mg by mouth at bedtime.   Yes [provider]  mycophenolate (MYFORTIC) 360 MG TBEC EC tablet Take 2 tablets (720 mg total) by mouth 2 (two) times daily. For renal transplant Patient taking differently: Take 1,440 mg by mouth 2 (two) times daily. For renal transplant 11/25/17  Yes Armandina Stammer I, NP  Oxycodone HCl 10 MG TABS Take 10 mg by mouth See admin instructions. Take 1 tablet by mouth by mouth every 4 to 6 hours as needed for sciatic nerve and back pain   Yes [provider]  pantoprazole (PROTONIX) 40 MG tablet Take 1 tablet (40 mg total) by mouth daily. For acid reflux 11/26/17  Yes Nwoko, Nicole Kindred I, NP  prazosin (MINIPRESS) 1 MG capsule Take 1 mg by mouth at bedtime.   Yes [provider]  predniSONE (DELTASONE) 5 MG tablet Take 5 mg by mouth daily with breakfast.   Yes [provider]  simvastatin (ZOCOR) 10 MG tablet Take 1 tablet (10 mg total) by mouth at bedtime. For high cholesterol 11/25/17  Yes Nwoko, Nicole Kindred I, NP  sodium bicarbonate 650 MG tablet Take 1,300 mg by mouth 2 (two) times daily.   Yes [provider]  sulfamethoxazole-trimethoprim (BACTRIM,SEPTRA) 400-80 MG tablet Take 1 tablet by mouth 3 (three) times a week. For infection Patient taking differently: Take 1 tablet by mouth every Monday, Wednesday, and Friday. For infection 11/28/17  Yes Nwoko, Nicole Kindred I, NP  tacrolimus (PROGRAF) 1 MG capsule Take 4-5 mg by mouth See  admin instructions. Take 5 capsules by mouth in the morning and 4 capsules at night   Yes [provider]  zolpidem (AMBIEN) 5 MG tablet Take 1 tablet (5 mg total) by mouth at bedtime as needed for sleep. 11/25/17  Yes Armandina Stammer I, NP  naloxone [facility]) nasal spray 4 mg/0.1 mL  10/28/21   [provider]    I have reviewed the patient's current medications.  Labs:  Results for orders placed or performed during the hospital encounter of 08/26/22 (from the past 48 hour(s))  Lipase, blood     Status: None   Collection Time: 08/26/22  8:03 PM  Result Value Ref Range   Lipase 21 11 - 51 U/L    Comment: Performed at [facility] Lab, [address] [place], [place] [address]  Lactic acid, plasma     Status: None   Collection Time: 08/26/22  8:03 PM  Result Value Ref Range   Lactic Acid, Venous 1.1 0.5 - 1.9 mmol/L    Comment: Performed at [facility] Lab, [address] [place], [place] [address]  Comprehensive metabolic panel     Status: Abnormal   Collection Time: 08/26/22  8:03 PM  Result Value Ref Range   Sodium 136 135 - 145 mmol/L   Potassium 2.3 (LL) 3.5 - 5.1 mmol/L    Comment: REPEATED TO VERIFY CRITICAL RESULT CALLED TO, READ BACK BY AND VERIFIED WITH Sondra Barges, RN, 2128, 08/26/22, EADEDOKUN    Chloride 88 (L) 98 - 111 mmol/L   CO2 29 22 - 32 mmol/L   Glucose, Bld 83 70 - 99 mg/dL    Comment: Glucose reference range applies only to samples taken after fasting for at least 8 hours.  BUN 62 (H) 6 - 20 mg/dL   Creatinine, Ser 1.61 (H) 0.44 - 1.00 mg/dL   Calcium 7.7 (L) 8.9 - 10.3 mg/dL   Total Protein 7.9 6.5 - 8.1 g/dL   Albumin 4.0 3.5 - 5.0 g/dL   AST 21 15 - 41 U/L   ALT 23 0 - 44 U/L   Alkaline Phosphatase 104 38 - 126 U/L   Total Bilirubin 0.8 0.3 - 1.2 mg/dL   GFR, Estimated 16 (L) >60 mL/min    Comment: (NOTE) Calculated using the CKD-EPI Creatinine Equation (2021)    Anion gap 19 (H) 5 - 15    Comment: Performed at [facility]  Lab, [address], [place], [place] [address]  CBC with Differential     Status: Abnormal   Collection Time: 08/26/22  8:03 PM  Result Value Ref Range   WBC 9.3 4.0 - 10.5 K/uL   RBC 4.93 3.87 - 5.11 MIL/uL   Hemoglobin 12.5 12.0 - 15.0 g/dL   HCT 54.0 98.1 - 19.1 %   MCV 79.9 (L) 80.0 - 100.0 fL   MCH 25.4 (L) 26.0 - 34.0 pg   MCHC 31.7 30.0 - 36.0 g/dL   RDW 47.8 29.5 - 62.1 %   Platelets 319 150 - 400 K/uL   nRBC 0.0 0.0 - 0.2 %   Neutrophils Relative % 73 %   Neutro Abs 6.8 1.7 - 7.7 K/uL   Lymphocytes Relative 15 %   Lymphs Abs 1.4 0.7 - 4.0 K/uL   Monocytes Relative 10 %   Monocytes Absolute 0.9 0.1 - 1.0 K/uL   Eosinophils Relative 1 %   Eosinophils Absolute 0.1 0.0 - 0.5 K/uL   Basophils Relative 0 %   Basophils Absolute 0.0 0.0 - 0.1 K/uL   Immature Granulocytes 1 %   Abs Immature Granulocytes 0.05 0.00 - 0.07 K/uL    Comment: Performed at [facility] Lab, [address] [place], [place] [address]  Magnesium     Status: Abnormal   Collection Time: 08/26/22  8:03 PM  Result Value Ref Range   Magnesium 1.2 (L) 1.7 - 2.4 mg/dL    Comment: Performed at [facility] Lab, [address], [place], [place] [address]  CK     Status: Abnormal   Collection Time: 08/26/22  8:03 PM  Result Value Ref Range   Total CK 371 (H) 38 - 234 U/L    Comment: Performed at [facility] Lab, [address], [place], [place] [address]  Phosphorus     Status: None   Collection Time: 08/26/22  8:03 PM  Result Value Ref Range   Phosphorus 4.6 2.5 - 4.6 mg/dL    Comment: Performed at [facility] Lab, [address], [place], [place] [address]  TSH     Status: None   Collection Time: 08/26/22  8:03 PM  Result Value Ref Range   TSH 1.532 0.350 - 4.500 uIU/mL    Comment: Performed by a 3rd Generation assay with a functional sensitivity of <=0.01 uIU/mL. Performed at [facility] Lab, [address], [place], [place] [address]   Osmolality     Status: Abnormal   Collection Time:  08/26/22  8:03 PM  Result Value Ref Range   Osmolality 306 (H) 275 - 295 mOsm/kg    Comment: Performed at [facility], [address], [place], [place] [address]  Urinalysis, Routine w reflex microscopic -Urine, Clean Catch  Status: Abnormal   Collection Time: 08/26/22  8:08 PM  Result Value Ref Range   Color, Urine YELLOW YELLOW   APPearance HAZY (A) CLEAR   Specific Gravity, Urine 1.013 1.005 - 1.030   pH 6.0 5.0 - 8.0   Glucose, UA NEGATIVE NEGATIVE mg/dL   Hgb urine dipstick NEGATIVE NEGATIVE   Bilirubin Urine NEGATIVE NEGATIVE   Ketones, ur NEGATIVE NEGATIVE mg/dL   Protein, ur NEGATIVE NEGATIVE mg/dL   Nitrite NEGATIVE NEGATIVE   Leukocytes,Ua TRACE (A) NEGATIVE   RBC / HPF 0-5 0 - 5 RBC/hpf   WBC, UA 0-5 0 - 5 WBC/hpf   Bacteria, UA RARE (A) NONE SEEN   Squamous Epithelial / HPF 6-10 0 - 5 /HPF   Hyaline Casts, UA PRESENT     Comment: Performed at [facility] Lab, [address], [place], [place] [address]  CBG monitoring, ED     Status: None   Collection Time: 08/26/22  8:10 PM  Result Value Ref Range   Glucose-Capillary 71 70 - 99 mg/dL    Comment: Glucose reference range applies only to samples taken after fasting for at least 8 hours.  I-Stat beta hCG blood, ED     Status: None   Collection Time: 08/26/22  8:11 PM  Result Value Ref Range   I-stat hCG, quantitative <5.0 <5 mIU/mL   Comment 3            Comment:   GEST. AGE      CONC.  (mIU/mL)   <=1 WEEK        5 - 50     2 WEEKS       50 - 500     3 WEEKS       100 - 10,000     4 WEEKS     1,000 - 30,000        FEMALE AND NON-PREGNANT FEMALE:     LESS THAN 5 mIU/mL   CBG monitoring, ED     Status: None   Collection Time: 08/27/22 12:25 AM  Result Value Ref Range   Glucose-Capillary 89 70 - 99 mg/dL    Comment: Glucose reference range applies only to samples taken after fasting for at least 8 hours.  Lactic acid, plasma     Status: None   Collection Time: 08/27/22  3:45 AM  Result Value Ref Range    Lactic Acid, Venous 1.1 0.5 - 1.9 mmol/L    Comment: Performed at [facility] Lab, [address] [place], [place] [address]  Prealbumin     Status: None   Collection Time: 08/27/22  3:45 AM  Result Value Ref Range   Prealbumin 25 18 - 38 mg/dL    Comment: Performed at [facility] Lab, [address] [place], [place] [address]  Magnesium     Status: Abnormal   Collection Time: 08/27/22  3:45 AM  Result Value Ref Range   Magnesium 1.6 (L) 1.7 - 2.4 mg/dL    Comment: Performed at [facility] Lab, [address] [place], [place] [address]  Phosphorus     Status: None   Collection Time: 08/27/22  3:45 AM  Result Value Ref Range   Phosphorus 3.7 2.5 - 4.6 mg/dL    Comment: Performed at [facility] Lab, [address] [place], [place] [address]  Comprehensive metabolic panel     Status: Abnormal   Collection Time: 08/27/22  3:45 AM  Result Value Ref Range  Sodium 138 135 - 145 mmol/L   Potassium 2.2 (LL) 3.5 - 5.1 mmol/L    Comment: CRITICAL RESULT CALLED TO, READ BACK BY AND VERIFIED WITH E. CASCRO, RN, [phone], 08/27/22   Chloride 94 (L) 98 - 111 mmol/L   CO2 29 22 - 32 mmol/L   Glucose, Bld 94 70 - 99 mg/dL    Comment: Glucose reference range applies only to samples taken after fasting for at least 8 hours.   BUN 50 (H) 6 - 20 mg/dL   Creatinine, Ser 5.62 (H) 0.44 - 1.00 mg/dL   Calcium 7.5 (L) 8.9 - 10.3 mg/dL   Total Protein 6.5 6.5 - 8.1 g/dL   Albumin 3.4 (L) 3.5 - 5.0 g/dL   AST 20 15 - 41 U/L   ALT 20 0 - 44 U/L   Alkaline Phosphatase 90 38 - 126 U/L   Total Bilirubin 0.8 0.3 - 1.2 mg/dL   GFR, Estimated 25 (L) >60 mL/min    Comment: (NOTE) Calculated using the CKD-EPI Creatinine Equation (2021)    Anion gap 15 5 - 15    Comment: Performed at [facility] Lab, [address] [place], [place] [address]  CBC     Status: Abnormal   Collection Time: 08/27/22  3:45 AM  Result Value Ref Range   WBC 7.4 4.0 - 10.5 K/uL   RBC 4.48 3.87 - 5.11 MIL/uL    Hemoglobin 11.6 (L) 12.0 - 15.0 g/dL   HCT 57.8 (L) 46.9 - 62.9 %   MCV 79.9 (L) 80.0 - 100.0 fL   MCH 25.9 (L) 26.0 - 34.0 pg   MCHC 32.4 30.0 - 36.0 g/dL   RDW 52.8 41.3 - 24.4 %   Platelets 288 150 - 400 K/uL   nRBC 0.0 0.0 - 0.2 %    Comment: Performed at [facility] Lab, [address] [place], [place] [address]  Glucose, capillary     Status: Abnormal   Collection Time: 08/27/22  4:12 AM  Result Value Ref Range   Glucose-Capillary 101 (H) 70 - 99 mg/dL    Comment: Glucose reference range applies only to samples taken after fasting for at least 8 hours.  Glucose, capillary     Status: None   Collection Time: 08/27/22  7:34 AM  Result Value Ref Range   Glucose-Capillary 83 70 - 99 mg/dL    Comment: Glucose reference range applies only to samples taken after fasting for at least 8 hours.     ROS:  Pertinent items are noted in HPI.  Physical Exam: Vitals:   08/27/22 0411 08/27/22 0743  BP: (!) 146/74 (!) 141/75  Pulse: 93 92  Resp: 20 18  Temp: 99.1 F (37.3 C) 98.5 F (36.9 C)  SpO2: 95% 98%     General: Middle-aged person resting in bed comfortably in no acute distress HEENT: Normocephalic, atraumatic. Dry mucous membranes.  Eyes: Vision grossly in tact. No scleral icterus or conjunctival injection.  Neck: Supple, no jugular venous distention.  Heart: Regular rate, rhythm. No murmurs appreciated. Distal pulses 2+ bilaterally.  Lungs: Normal work of breathing on room air. Clear to auscultation bilaterally.  Abdomen:Soft, non-distended, non-tender. Normoactive bowel sounds.  Extremities: No peripheral edema noted.  Skin: Warm, dry. No rashes or lesions appreciated.  Neuro: Somnolent but easily arousable, conversing appropriately. Grossly non-focal.   Assessment/Plan:  Linda Ellison is a 46yo person on chronic immunosuppression due to renal transplant in 2018 complicated by renal artery stenosis of transplant  artery requiring multiple angioplasties admitted  overnight due to acute kidney injury in the setting of poor po intake and GI losses over the last week.   Acute kidney injury > Baseline renal function after transplantation appears to be normal w/ GFR >60, sCr 1 (last labs in 01/2022). She has been making urine as normal, denies any recent change in urine output. With her GI losses I suspect poor perfusion due to dehydration is the most likely cause. Creatinine and GFR have improved with IVF thus far, this morning sCr down to 2.3 and she continues to have good urine output. She says she has not had any bowel movements since she arrived and is able to take in some (although minimal) food thus far. Would continue with her immunosuppressive medications, re-start mycophenolate tomorrow, hold Bactrim for now. Continue with intravenous fluids. Urine studies were collected overnight, we will follow these up as well.  For complete work-up and given difficulties with stenosis in the past will also obtain ultrasound with dopplers.  Hypokalemia, hypomagnesemia > K 2.2, Mg 1.6. Will give more now.  GI illness > IVF, management per primary Type II diabetes mellitus > sugars at goal, keep sugars <180   Evlyn Kanner 08/27/2022, 9:19 AM

## 2022-08-27 NOTE — Progress Notes (Signed)
  Transition of Care [facility]) Screening Note   Patient Details  Name: Linda Ellison Date of Birth: 05/30/1975   Transition of Care [facility]) CM/SW Contact:    Tom-Johnson, Hershal Coria, RN Phone Number: 08/27/2022, 11:13 AM  Transition of Care Department [facility]) has reviewed patient and no TOC needs or recommendations have been identified at this time. TOC will continue to monitor patient advancement through interdisciplinary progression rounds. If new patient transition needs arise, please place a TOC consult.

## 2022-08-27 NOTE — Plan of Care (Signed)

## 2022-08-28 ENCOUNTER — Inpatient Hospital Stay (HOSPITAL_COMMUNITY): Payer: Medicare (Managed Care)

## 2022-08-28 DIAGNOSIS — N179 Acute kidney failure, unspecified: Secondary | ICD-10-CM | POA: Diagnosis not present

## 2022-08-28 DIAGNOSIS — E43 Unspecified severe protein-calorie malnutrition: Secondary | ICD-10-CM | POA: Insufficient documentation

## 2022-08-28 LAB — HEMOGLOBIN A1C
Hgb A1c MFr Bld: 6.7 % — ABNORMAL HIGH (ref 4.8–5.6)
Mean Plasma Glucose: 146 mg/dL

## 2022-08-28 LAB — CBC WITH DIFFERENTIAL/PLATELET
Abs Immature Granulocytes: 0.02 10*3/uL (ref 0.00–0.07)
Basophils Absolute: 0 10*3/uL (ref 0.0–0.1)
Basophils Relative: 0 %
Eosinophils Absolute: 0.2 10*3/uL (ref 0.0–0.5)
Eosinophils Relative: 2 %
HCT: 36.1 % (ref 36.0–46.0)
Hemoglobin: 11.3 g/dL — ABNORMAL LOW (ref 12.0–15.0)
Immature Granulocytes: 0 %
Lymphocytes Relative: 17 %
Lymphs Abs: 1.4 10*3/uL (ref 0.7–4.0)
MCH: 25.3 pg — ABNORMAL LOW (ref 26.0–34.0)
MCHC: 31.3 g/dL (ref 30.0–36.0)
MCV: 80.9 fL (ref 80.0–100.0)
Monocytes Absolute: 1.1 10*3/uL — ABNORMAL HIGH (ref 0.1–1.0)
Monocytes Relative: 14 %
Neutro Abs: 5.4 10*3/uL (ref 1.7–7.7)
Neutrophils Relative %: 67 %
Platelets: 283 10*3/uL (ref 150–400)
RBC: 4.46 MIL/uL (ref 3.87–5.11)
RDW: 12.5 % (ref 11.5–15.5)
WBC: 8.2 10*3/uL (ref 4.0–10.5)
nRBC: 0 % (ref 0.0–0.2)

## 2022-08-28 LAB — BASIC METABOLIC PANEL
Anion gap: 10 (ref 5–15)
BUN: 33 mg/dL — ABNORMAL HIGH (ref 6–20)
CO2: 24 mmol/L (ref 22–32)
Calcium: 7.6 mg/dL — ABNORMAL LOW (ref 8.9–10.3)
Chloride: 105 mmol/L (ref 98–111)
Creatinine, Ser: 1.57 mg/dL — ABNORMAL HIGH (ref 0.44–1.00)
GFR, Estimated: 41 mL/min — ABNORMAL LOW (ref >60)
Glucose, Bld: 183 mg/dL — ABNORMAL HIGH (ref 70–99)
Potassium: 3.3 mmol/L — ABNORMAL LOW (ref 3.5–5.1)
Sodium: 139 mmol/L (ref 135–145)

## 2022-08-28 LAB — GLUCOSE, CAPILLARY
Glucose-Capillary: 146 mg/dL — ABNORMAL HIGH (ref 70–99)
Glucose-Capillary: 163 mg/dL — ABNORMAL HIGH (ref 70–99)
Glucose-Capillary: 196 mg/dL — ABNORMAL HIGH (ref 70–99)
Glucose-Capillary: 206 mg/dL — ABNORMAL HIGH (ref 70–99)

## 2022-08-28 LAB — MAGNESIUM: Magnesium: 1.9 mg/dL (ref 1.7–2.4)

## 2022-08-28 MED ORDER — ACETAMINOPHEN 325 MG PO TABS
650.0000 mg | ORAL_TABLET | Freq: Four times a day (QID) | ORAL | Status: DC | PRN
  Administered 2022-08-28: 650 mg via ORAL

## 2022-08-28 MED ORDER — MYCOPHENOLATE SODIUM 180 MG PO TBEC
720.0000 mg | DELAYED_RELEASE_TABLET | Freq: Two times a day (BID) | ORAL | Status: DC
  Administered 2022-08-28: 720 mg via ORAL
  Filled 2022-08-28: qty 4

## 2022-08-28 MED ORDER — DIPHENHYDRAMINE HCL 25 MG PO CAPS
25.0000 mg | ORAL_CAPSULE | Freq: Four times a day (QID) | ORAL | Status: DC | PRN
  Administered 2022-08-28: 25 mg via ORAL
  Filled 2022-08-28: qty 1

## 2022-08-28 MED ORDER — ACETAMINOPHEN 325 MG PO TABS
ORAL_TABLET | ORAL | Status: AC
  Filled 2022-08-28: qty 2

## 2022-08-28 MED ORDER — POTASSIUM CHLORIDE CRYS ER 20 MEQ PO TBCR
40.0000 meq | EXTENDED_RELEASE_TABLET | ORAL | Status: AC
  Administered 2022-08-28 (×2): 40 meq via ORAL
  Filled 2022-08-28 (×2): qty 2

## 2022-08-28 MED ORDER — DIPHENHYDRAMINE HCL 50 MG/ML IJ SOLN
25.0000 mg | Freq: Once | INTRAMUSCULAR | Status: AC
  Administered 2022-08-28: 25 mg via INTRAVENOUS
  Filled 2022-08-28: qty 1

## 2022-08-28 NOTE — Discharge Summary (Signed)
Physician Discharge Summary  Linda Ellison ZOX:[id number] DOB: 1975-04-27 DOA: 08/26/2022  PCP: Daylene Katayama, PA  Admit date: 08/26/2022 Discharge date: 08/28/2022  Admitted From: Home Disposition:  Home  Recommendations for Outpatient Follow-up:  Follow up with PCP in 1 week Repeat BMP in 1 week Continue to drink plenty of water and avoid use of NSAIDs You can take Imodium as needed for diarrhea Follow-up with Dr. Thedore Mins in 2 weeks  Hold on Bumex and chlorthalidone until follow-up with Dr. Thedore Mins  Home Health: None Equipment/Devices: None Discharge Condition: Stable CODE STATUS: Full code Diet recommendation: Low-salt diet  Brief/Interim Summary: Linda Ellison is a 47 y.o. female with PMH significant for ESRD due to diabetic nephropathy now s/p deceased donor renal transplant 10/2016 complicated by renal artery stenosis in 2019 s/p angioplasty and stent of right renal artery; also with DM2, HTN, HLD, obesity, OSA, CAD, hypothyroidism, peripheral neuropathy, gastroparesis, chronic anemia, anxiety/depression 2 weeks prior, patient and her significant other were in a cookout.  Following that, both of them started having nausea, vomiting, diarrhea averaging 5-10 episodes of diarrhea a day.  Her significant other was hospitalized and noted to have Salmonella and Campylobacter.  Patient also had lower abdominal pain and generalized weakness but her symptoms were less pronounced and hence she did not need hospitalization.  She however reports she has not been able to maintain hydration because of abdominal cramping and diarrhea. 5/22, patient was seen at the nephrologist's office.  Labs showed potassium low at 2.6 and creatinine elevated and hence sent to the ED.   In the ED, afebrile, heart rate in 80s, blood pressure 121/83 Initial labs with CBC unremarkable, CMP with potassium low at 2.3, magnesium low at 1.2, BUN/creatinine elevated to 62/3.46, lactic acid 1.1, CT abdomen pelvis did not  show any acute intra-abdominal pathology. Patient was started on IV hydration. Admitted to [facility]. Nephrology consulted  AKI in the setting of dehydration in the setting of diarrhea due to norovirus History of renal transplant status 10/2016 -immunosuppression with Myfortic twice daily,, Prograf twice daily, and prednisone 5 mg daily.  Also on Bactrim 3 times a week -CT abdomen showed normal-appearing transplant kidney on the right pelvis. -Renal function improved after IV hydration.  Diarrhea improved. -Renal duplex ultrasound showed some volume loss in transplanted kidney however no hydronephrosis or renal artery stenosis.  IV fluids discontinued -okay to discharge from nephrology standpoint.  Resume immunosuppressant per nephrology.  Follow-up with Dr. Thedore Mins outpatient  Hypokalemia/hypomagnesemia -Replenished before discharge.  Repeat BMP with PCP outpatient   Essential hypertension Home list shows multiple medication including clonidine 0.3 mg 3 times daily, labetalol 400 mg 3 times daily, prazosin 1 mg daily, amlodipine 10 mg daily, chlorthalidone 25 mg daily, Bumex 2 mg twice daily -Continued clonidine, amlodipine, prazosin as well as labetalol. -Discussed with nephrology.  Will continue to hold on Bumex as well as chlorthalidone at the time of discharge until follow-up with nephrology.   Type 2 diabetes mellitus  -A1c 6.7.  Continued home medications.  Neurontin nightly as needed for neuropathy   Chronic anemia -Remained stable at baseline   GERD Continued PPI,    Morbid Obesity  Body mass index is 38.95 kg/m.  Associated with hypertension and diabetes.  Patient has been advised to make an attempt to improve diet and exercise patterns to aid in weight loss.   OSA -Continued CPAP   COPD Remained stable.  No wheezing.  On room air.  Continued bronchodilators   Anxiety/depression -Continued  Abilify 30 mg daily, bupropion daily, Atarax nightly,  Discharge Diagnoses:  Acute  kidney injury in the setting of transplanted kidney Hypokalemia Hypomagnesemia Essential hypertension Type 2 diabetes mellitus uncontrolled Diabetic neuropathy Coronary artery disease Hyperlipidemia Chronic anemia GERD Morbid obesity OSA COPD Anxiety/depression  Discharge Instructions  Discharge Instructions     Diet - low sodium heart healthy   Complete by: As directed    Increase activity slowly   Complete by: As directed       Allergies as of 08/28/2022       Reactions   Ciprofloxacin Hives, Itching, Nausea And Vomiting   Fluocinolone Itching, Nausea And Vomiting, Other (See Comments)   Hydromorphone Hives   Strawberry Extract Itching, Other (See Comments)   Pt states that this medication makes her tongue raw.   No reaction   Hydromorphone Hcl Hives   Phenytoin Sodium Extended Itching   Adhesive [tape] Itching, Rash   Chlorhexidine Gluconate Itching   Clindamycin Diarrhea, Nausea And Vomiting, Rash   Oxycodone Nausea And Vomiting, Rash, Other (See Comments)   Reaction:  Hallucinations    Shrimp [shellfish Allergy] Rash        Medication List     STOP taking these medications    bumetanide 2 MG tablet Commonly known as: BUMEX   chlorthalidone 25 MG tablet Commonly known as: HYGROTON       TAKE these medications    albuterol 108 (90 Base) MCG/ACT inhaler Commonly known as: VENTOLIN HFA Inhale 2 puffs into the lungs every 6 (six) hours as needed for wheezing or shortness of breath.   amLODipine 10 MG tablet Commonly known as: NORVASC Take 1 tablet by mouth daily.   ARIPiprazole 30 MG tablet Commonly known as: ABILIFY Take 30 mg by mouth daily.   atorvastatin 10 MG tablet Commonly known as: LIPITOR Take 10 mg by mouth daily.   buPROPion 300 MG 24 hr tablet Commonly known as: WELLBUTRIN XL Take 300 mg by mouth daily. What changed: Another medication with the same name was changed. Make sure you understand how and when to take each.    buPROPion 150 MG 24 hr tablet Commonly known as: WELLBUTRIN XL Take 1 tablet (150 mg total) by mouth daily after breakfast. For depression What changed: additional instructions   cetirizine 10 MG tablet Commonly known as: ZYRTEC Take 10 mg by mouth 2 (two) times daily.   cinacalcet 30 MG tablet Commonly known as: SENSIPAR Take 30 mg by mouth daily.   citalopram 40 MG tablet Commonly known as: CELEXA Take 40 mg by mouth daily.   cloNIDine 0.3 MG tablet Commonly known as: CATAPRES Take 0.3 mg by mouth 3 (three) times daily.   Dulaglutide 4.5 MG/0.5ML Sopn Inject 4.5 mg into the skin once a week. Every Tuesday   gabapentin 300 MG capsule Commonly known as: NEURONTIN Take 300 mg by mouth at bedtime as needed. pain   hydrOXYzine 25 MG tablet Commonly known as: ATARAX Take 25 mg by mouth at bedtime.   insulin aspart 100 UNIT/ML injection Commonly known as: novoLOG USE UP TO 100 UNITS ONCE DAILY IN INSULIN PUMP. DX CODE E11.42   insulin pump Soln Inject 1 each into the skin See admin instructions.   labetalol 200 MG tablet Commonly known as: NORMODYNE Take 2 tablets (400 mg total) by mouth 3 (three) times daily. For high blood pressure   montelukast 10 MG tablet Commonly known as: SINGULAIR Take 10 mg by mouth at bedtime.   mycophenolate 360 MG  Tbec EC tablet Commonly known as: MYFORTIC Take 2 tablets (720 mg total) by mouth 2 (two) times daily. For renal transplant What changed: how much to take   naloxone 4 MG/0.1ML Liqd nasal spray kit Commonly known as: NARCAN   Oxycodone HCl 10 MG Tabs Take 10 mg by mouth See admin instructions. Take 1 tablet by mouth by mouth every 4 to 6 hours as needed for sciatic nerve and back pain   pantoprazole 40 MG tablet Commonly known as: PROTONIX Take 1 tablet (40 mg total) by mouth daily. For acid reflux   prazosin 1 MG capsule Commonly known as: MINIPRESS Take 1 mg by mouth at bedtime.   predniSONE 5 MG tablet Commonly  known as: DELTASONE Take 5 mg by mouth daily with breakfast.   Prograf 1 MG capsule Generic drug: tacrolimus Take 4-5 mg by mouth See admin instructions. Take 5 capsules by mouth in the morning and 4 capsules at night   simvastatin 10 MG tablet Commonly known as: ZOCOR Take 1 tablet (10 mg total) by mouth at bedtime. For high cholesterol   sodium bicarbonate 650 MG tablet Take 1,300 mg by mouth 2 (two) times daily.   sulfamethoxazole-trimethoprim 400-80 MG tablet Commonly known as: BACTRIM Take 1 tablet by mouth 3 (three) times a week. For infection What changed: when to take this   zolpidem 5 MG tablet Commonly known as: AMBIEN Take 1 tablet (5 mg total) by mouth at bedtime as needed for sleep.        Follow-up Information     [place], [place], [place] Follow up in 1 week(s).   Specialty: Family Medicine Contact information: [address] [place] [address] [phone]         Anthony Sar, MD. Schedule an appointment as soon as possible for a visit in 2 week(s).   Specialty: Nephrology Contact information: [address] [place] [address] [phone]                Allergies  Allergen Reactions   Ciprofloxacin Hives, Itching and Nausea And Vomiting   Fluocinolone Itching, Nausea And Vomiting and Other (See Comments)   Hydromorphone Hives   Strawberry Extract Itching and Other (See Comments)    Pt states that this medication makes her tongue raw.   No reaction   Hydromorphone Hcl Hives   Phenytoin Sodium Extended Itching   Adhesive [Tape] Itching and Rash   Chlorhexidine Gluconate Itching   Clindamycin Diarrhea, Nausea And Vomiting and Rash   Oxycodone Nausea And Vomiting, Rash and Other (See Comments)    Reaction:  Hallucinations    Shrimp [Shellfish Allergy] Rash    Consultations: Nephrology   Procedures/Studies: US Renal Transplant w/Doppler  Result Date: 08/28/2022 CLINICAL DATA:  Acute kidney injury. EXAM: ULTRASOUND OF  RENAL TRANSPLANT WITH RENAL DOPPLER ULTRASOUND TECHNIQUE: Ultrasound examination of the renal transplant was performed with gray-scale, color and duplex doppler evaluation. COMPARISON:  11/06/2021 FINDINGS: Transplant kidney location: RLQ Transplant Kidney: Renal measurements: 10.6 x 6.5 x 6.5 cm = volume: 235 mL. There appears to be some potential interval volume loss of the transplant kidney with prior estimated volume of 315 mL. However, the kidney is not as well visualized on the current study compared to the prior study due to adjacent bowel gas and measurements may not be as accurate. No hydronephrosis, focal mass or adjacent fluid collection identified. Color flow in the main renal artery:  Yes Color flow in the main renal vein:  Yes  Duplex Doppler Evaluation: Main Renal Artery Velocity: 128 cm/sec Main Renal Artery Resistive Index: 0.68 Venous waveform in main renal vein:  Present Intrarenal resistive index in upper pole:  0.63 (normal 0.6-0.8; equivocal 0.8-0.9; abnormal >= 0.9) Intrarenal resistive index in lower pole: 0.63 (normal 0.6-0.8; equivocal 0.8-0.9; abnormal >= 0.9) Bladder: Normal for degree of bladder distention. Other findings:  None. IMPRESSION: There appears to be some potential interval volume loss of the transplant kidney. However, the kidney is not as well visualized on the current study due to adjacent bowel gas and measurements may not be as accurate. No hydronephrosis, focal mass or adjacent fluid collection identified. Measured transplant renal artery resistance indices are normal. Electronically Signed   By: Irish Lack M.D.   On: 08/28/2022 10:58   CT ABDOMEN PELVIS WO CONTRAST  Result Date: 08/27/2022 CLINICAL DATA:  Left lower quadrant pain. EXAM: CT ABDOMEN AND PELVIS WITHOUT CONTRAST TECHNIQUE: Multidetector CT imaging of the abdomen and pelvis was performed following the standard protocol without IV contrast. RADIATION DOSE REDUCTION: This exam was performed according  to the departmental dose-optimization program which includes automated exposure control, adjustment of the mA and/or kV according to patient size and/or use of iterative reconstruction technique. COMPARISON:  April 07, 2016 FINDINGS: Lower chest: No acute abnormality. Hepatobiliary: No focal liver abnormality is seen. Status post cholecystectomy. No biliary dilatation. Pancreas: Unremarkable. No pancreatic ductal dilatation or surrounding inflammatory changes. Spleen: Normal in size without focal abnormality. Adrenals/Urinary Tract: Adrenal glands are unremarkable. The native kidneys are atrophic in appearance, without renal calculi, focal lesion, or hydronephrosis. A normal appearing transplanted kidney is seen within the pelvis on the right bladder is unremarkable. Stomach/Bowel: Stomach is within normal limits. Appendix appears normal. No evidence of bowel wall thickening, distention, or inflammatory changes. Vascular/Lymphatic: Aortic atherosclerosis. No enlarged abdominal or pelvic lymph nodes. Reproductive: The uterus and right adnexa are unremarkable. A cluster of ill-defined, subcentimeter cysts are seen within the left ovary. Other: No abdominal wall hernia or abnormality. No abdominopelvic ascites. Musculoskeletal: No acute or significant osseous findings. IMPRESSION: 1. Cluster of ill-defined, subcentimeter cysts within the left ovary. No follow-up imaging is recommended. This recommendation follows ACR consensus guidelines: White Paper of the ACR Incidental Findings Committee II on Adnexal Findings. J Am Coll Radiol 2013:10:675-681. 2. Evidence of prior cholecystectomy. 3. Normal appearing transplanted kidney within the pelvis on the right. 4. Aortic atherosclerosis. Aortic Atherosclerosis (ICD10-I70.0). Electronically Signed   By: Aram Candela M.D.   On: 08/27/2022 00:03      Subjective: Patient seen and examined.  Reports improvement in diarrhea.  Had 2 episodes yesterday and one episode  this morning.  No abdominal pain, nausea, vomiting, fever, chills.  No acute events overnight.  Comfortable going home today.  Spouse at the bedside  Discharge Exam: Vitals:   08/28/22 0626 08/28/22 0918  BP: (!) 160/90 (!) 170/94  Pulse: 99 95  Resp: 20 19  Temp: 98.5 F (36.9 C) 98.5 F (36.9 C)  SpO2: 100% 100%   Vitals:   08/27/22 1539 08/27/22 2213 08/28/22 0626 08/28/22 0918  BP: (!) 161/81 (!) 153/84 (!) 160/90 (!) 170/94  Pulse: 86 92 99 95  Resp: 18 20 20 19   Temp: 98.3 F (36.8 C) 98.6 F (37 C) 98.5 F (36.9 C) 98.5 F (36.9 C)  TempSrc: Oral Oral Oral Oral  SpO2: 99% 98% 100% 100%  Weight:      Height:        General: Pt is alert, awake,  not in acute distress, on room air, communicating well Cardiovascular: RRR, S1/S2 +, no rubs, no gallops Respiratory: CTA bilaterally, no wheezing, no rhonchi Abdominal: Soft, NT, ND, bowel sounds + Extremities: no edema, no cyanosis    The results of significant diagnostics from this hospitalization (including imaging, microbiology, ancillary and laboratory) are listed below for reference.     Microbiology: Recent Results (from the past 240 hour(s))  Gastrointestinal Panel by PCR , Stool     Status: Abnormal   Collection Time: 08/26/22  9:11 PM   Specimen: Stool  Result Value Ref Range Status   Campylobacter species NOT DETECTED NOT DETECTED Final   Plesimonas shigelloides NOT DETECTED NOT DETECTED Final   Salmonella species NOT DETECTED NOT DETECTED Final   Yersinia enterocolitica NOT DETECTED NOT DETECTED Final   Vibrio species NOT DETECTED NOT DETECTED Final   Vibrio cholerae NOT DETECTED NOT DETECTED Final   Enteroaggregative E coli (EAEC) NOT DETECTED NOT DETECTED Final   Enteropathogenic E coli (EPEC) NOT DETECTED NOT DETECTED Final   Enterotoxigenic E coli (ETEC) NOT DETECTED NOT DETECTED Final   Shiga like toxin producing E coli (STEC) NOT DETECTED NOT DETECTED Final   Shigella/Enteroinvasive E coli (EIEC)  NOT DETECTED NOT DETECTED Final   Cryptosporidium NOT DETECTED NOT DETECTED Final   Cyclospora cayetanensis NOT DETECTED NOT DETECTED Final   Entamoeba histolytica NOT DETECTED NOT DETECTED Final   Giardia lamblia NOT DETECTED NOT DETECTED Final   Adenovirus F40/41 NOT DETECTED NOT DETECTED Final   Astrovirus NOT DETECTED NOT DETECTED Final   Norovirus GI/GII DETECTED (A) NOT DETECTED Final    Comment: RESULT CALLED TO, READ BACK BY AND VERIFIED WITH: DEE VRANCKX AT 1800 ON 08/27/22 BY SS    Rotavirus A NOT DETECTED NOT DETECTED Final   Sapovirus (I, II, IV, and V) NOT DETECTED NOT DETECTED Final    Comment: Performed at [facility], [address] [place], [place] [address]  C Difficile Quick Screen w PCR reflex     Status: None   Collection Time: 08/26/22  9:11 PM   Specimen: Stool  Result Value Ref Range Status   C Diff antigen NEGATIVE NEGATIVE Final   C Diff toxin NEGATIVE NEGATIVE Final   C Diff interpretation No C. difficile detected.  Final    Comment: Performed at [facility], [address] [place], [place] [address]     Labs: BNP (last 3 results) No results for input(s): "BNP" in the last 8760 hours. Basic Metabolic Panel: Recent Labs  Lab 08/26/22 2003 08/27/22 0345 08/27/22 1252 08/28/22 0237  NA 136 138 137 139  K 2.3* 2.2* 2.9* 3.3*  CL 88* 94* 100 105  CO2 29 29 28 24   GLUCOSE 83 94 176* 183*  BUN 62* 50* 39* 33*  CREATININE 3.46* 2.33* 1.81* 1.57*  CALCIUM 7.7* 7.5* 7.5* 7.6*  MG 1.2* 1.6*  --  1.9  PHOS 4.6 3.7  --   --    Liver Function Tests: Recent Labs  Lab 08/26/22 2003 08/27/22 0345  AST 21 20  ALT 23 20  ALKPHOS 104 90  BILITOT 0.8 0.8  PROT 7.9 6.5  ALBUMIN 4.0 3.4*   Recent Labs  Lab 08/26/22 2003  LIPASE 21   No results for input(s): "AMMONIA" in the last 168 hours. CBC: Recent Labs  Lab 08/26/22 2003 08/27/22 0345 08/28/22 0237  WBC 9.3 7.4 8.2  NEUTROABS 6.8  --  5.4  HGB 12.5 11.6* 11.3*  HCT  39.4 35.8* 36.1  MCV 79.9* 79.9* 80.9  PLT 319 288 283   Cardiac Enzymes: Recent Labs  Lab 08/26/22 2003  CKTOTAL 371*   BNP: Invalid input(s): "POCBNP" CBG: Recent Labs  Lab 08/27/22 1541 08/27/22 2215 08/28/22 0009 08/28/22 0422 08/28/22 0734  GLUCAP 164* 169* 196* 163* 146*   D-Dimer No results for input(s): "DDIMER" in the last 72 hours. Hgb A1c Recent Labs    08/27/22 0343  HGBA1C 6.7*   Lipid Profile No results for input(s): "CHOL", "HDL", "LDLCALC", "TRIG", "CHOLHDL", "LDLDIRECT" in the last 72 hours. Thyroid function studies Recent Labs    08/27/22 0343  TSH 1.346   Anemia work up No results for input(s): "VITAMINB12", "FOLATE", "FERRITIN", "TIBC", "IRON", "RETICCTPCT" in the last 72 hours. Urinalysis    Component Value Date/Time   COLORURINE YELLOW 08/26/2022 2008   APPEARANCEUR HAZY (A) 08/26/2022 2008   LABSPEC 1.013 08/26/2022 2008   PHURINE 6.0 08/26/2022 2008   GLUCOSEU NEGATIVE 08/26/2022 2008   GLUCOSEU >1000 06/08/2010 0000   HGBUR NEGATIVE 08/26/2022 2008   BILIRUBINUR NEGATIVE 08/26/2022 2008   KETONESUR NEGATIVE 08/26/2022 2008   PROTEINUR NEGATIVE 08/26/2022 2008   UROBILINOGEN 0.2 05/04/2014 1339   NITRITE NEGATIVE 08/26/2022 2008   LEUKOCYTESUR TRACE (A) 08/26/2022 2008   Sepsis Labs Recent Labs  Lab 08/26/22 2003 08/27/22 0345 08/28/22 0237  WBC 9.3 7.4 8.2   Microbiology Recent Results (from the past 240 hour(s))  Gastrointestinal Panel by PCR , Stool     Status: Abnormal   Collection Time: 08/26/22  9:11 PM   Specimen: Stool  Result Value Ref Range Status   Campylobacter species NOT DETECTED NOT DETECTED Final   Plesimonas shigelloides NOT DETECTED NOT DETECTED Final   Salmonella species NOT DETECTED NOT DETECTED Final   Yersinia enterocolitica NOT DETECTED NOT DETECTED Final   Vibrio species NOT DETECTED NOT DETECTED Final   Vibrio cholerae NOT DETECTED NOT DETECTED Final   Enteroaggregative E coli (EAEC) NOT  DETECTED NOT DETECTED Final   Enteropathogenic E coli (EPEC) NOT DETECTED NOT DETECTED Final   Enterotoxigenic E coli (ETEC) NOT DETECTED NOT DETECTED Final   Shiga like toxin producing E coli (STEC) NOT DETECTED NOT DETECTED Final   Shigella/Enteroinvasive E coli (EIEC) NOT DETECTED NOT DETECTED Final   Cryptosporidium NOT DETECTED NOT DETECTED Final   Cyclospora cayetanensis NOT DETECTED NOT DETECTED Final   Entamoeba histolytica NOT DETECTED NOT DETECTED Final   Giardia lamblia NOT DETECTED NOT DETECTED Final   Adenovirus F40/41 NOT DETECTED NOT DETECTED Final   Astrovirus NOT DETECTED NOT DETECTED Final   Norovirus GI/GII DETECTED (A) NOT DETECTED Final    Comment: RESULT CALLED TO, READ BACK BY AND VERIFIED WITH: DEE VRANCKX AT 1800 ON 08/27/22 BY SS    Rotavirus A NOT DETECTED NOT DETECTED Final   Sapovirus (I, II, IV, and V) NOT DETECTED NOT DETECTED Final    Comment: Performed at [facility], [address] [place], [place] [address]  C Difficile Quick Screen w PCR reflex     Status: None   Collection Time: 08/26/22  9:11 PM   Specimen: Stool  Result Value Ref Range Status   C Diff antigen NEGATIVE NEGATIVE Final   C Diff toxin NEGATIVE NEGATIVE Final   C Diff interpretation No C. difficile detected.  Final    Comment: Performed at [facility], [address] [place], [place] [address]     Time coordinating discharge: Over 30 minutes  SIGNED:   Ollen Bowl, MD  [facility] 08/28/2022, 11:31 AM Pager   If 7PM-7AM, please contact night-coverage [email or web address]

## 2022-08-28 NOTE — Progress Notes (Signed)
DISCHARGE NOTE HOME SHAQUERIA WILMOTH to be discharged Home per MD order. Diagnosis, treatment, medications and follow up appointments discussed with the patient who verbalized knowledge and an understanding. Medication list explained in detail. Patient verbalized understanding.VSS. No complaints of pain.  Skin clean, dry and intact without evidence of skin break down, no evidence of skin tears noted. IV catheter discontinued intact. Site without signs and symptoms of complications. Dressing and pressure applied. Pt denies pain at the site currently. No complaints noted.   An After Visit Summary (AVS) was printed and given to the patient. Patient escorted via wheelchair, and discharged home via private auto.  Tresa Endo, RN

## 2022-08-28 NOTE — Progress Notes (Signed)
[facility] NEPHROLOGY PROGRESS NOTE  Assessment/ Plan: Pt is a 47 y.o. yo female  ESRD status post kidney transplant back in 2018 presented with 4 to 5 days of nausea vomiting and diarrhea. It seems like the diarrhea was caused by food poisoning or GI illness. The labs showed acute kidney injury, hypokalemia.   # Acute kidney injury, nonoliguric in transplanted kidney likely hemodynamically mediated in the setting of severe dehydration caused by diarrhea, nausea vomiting.   Urinalysis is bland.  Kidney ultrasound pending. The creatinine level is significantly improved with IV hydration.  I will resume Myfortic and plan to continue prednisone and Prograf.  Since she is able to tolerate oral, I will DC IV fluid.  She will follow with Dr. Thedore Mins after discharge.  Please resume all of her transplant medication on discharge   #Hypokalemia: Due to GI loss.  Repleted potassium chloride.  #Diarrhea, nausea vomiting: Stool test negative for pathogens.  Clinically improving.  Able to tolerate oral.  Discontinue IV fluid.    # Hypomagnesemia: Improved with repletion.  Sign off, please call back with question.  Discussed with the primary team.  Subjective: Seen and examined at the bedside.  Patient reported feeling much better.  Denies nausea, vomiting, chest pain, shortness of breath.  Bowel movement improving.  Her fianc was at the bedside. Objective Vital signs in last 24 hours: Vitals:   08/27/22 1539 08/27/22 2213 08/28/22 0626 08/28/22 0918  BP: (!) 161/81 (!) 153/84 (!) 160/90 (!) 170/94  Pulse: 86 92 99 95  Resp: 18 20 20 19   Temp: 98.3 F (36.8 C) 98.6 F (37 C) 98.5 F (36.9 C) 98.5 F (36.9 C)  TempSrc: Oral Oral Oral Oral  SpO2: 99% 98% 100% 100%  Weight:      Height:       Weight change:   Intake/Output Summary (Last 24 hours) at 08/28/2022 1031 Last data filed at 08/28/2022 0900 Gross per 24 hour  Intake 2030.5 ml  Output 800 ml  Net 1230.5 ml        Labs: RENAL PANEL Recent Labs  Lab 08/26/22 2003 08/27/22 0345 08/27/22 1252 08/28/22 0237  NA 136 138 137 139  K 2.3* 2.2* 2.9* 3.3*  CL 88* 94* 100 105  CO2 29 29 28 24   GLUCOSE 83 94 176* 183*  BUN 62* 50* 39* 33*  CREATININE 3.46* 2.33* 1.81* 1.57*  CALCIUM 7.7* 7.5* 7.5* 7.6*  MG 1.2* 1.6*  --  1.9  PHOS 4.6 3.7  --   --   ALBUMIN 4.0 3.4*  --   --     Liver Function Tests: Recent Labs  Lab 08/26/22 2003 08/27/22 0345  AST 21 20  ALT 23 20  ALKPHOS 104 90  BILITOT 0.8 0.8  PROT 7.9 6.5  ALBUMIN 4.0 3.4*   Recent Labs  Lab 08/26/22 2003  LIPASE 21   No results for input(s): "AMMONIA" in the last 168 hours. CBC: Recent Labs    08/26/22 2003 08/27/22 0345 08/28/22 0237  HGB 12.5 11.6* 11.3*  MCV 79.9* 79.9* 80.9    Cardiac Enzymes: Recent Labs  Lab 08/26/22 2003  CKTOTAL 371*   CBG: Recent Labs  Lab 08/27/22 1541 08/27/22 2215 08/28/22 0009 08/28/22 0422 08/28/22 0734  GLUCAP 164* 169* 196* 163* 146*    Iron Studies: No results for input(s): "IRON", "TIBC", "TRANSFERRIN", "FERRITIN" in the last 72 hours. Studies/Results: CT ABDOMEN PELVIS WO CONTRAST  Result Date: 08/27/2022 CLINICAL DATA:  Left lower  quadrant pain. EXAM: CT ABDOMEN AND PELVIS WITHOUT CONTRAST TECHNIQUE: Multidetector CT imaging of the abdomen and pelvis was performed following the standard protocol without IV contrast. RADIATION DOSE REDUCTION: This exam was performed according to the departmental dose-optimization program which includes automated exposure control, adjustment of the mA and/or kV according to patient size and/or use of iterative reconstruction technique. COMPARISON:  April 07, 2016 FINDINGS: Lower chest: No acute abnormality. Hepatobiliary: No focal liver abnormality is seen. Status post cholecystectomy. No biliary dilatation. Pancreas: Unremarkable. No pancreatic ductal dilatation or surrounding inflammatory changes. Spleen: Normal in size without  focal abnormality. Adrenals/Urinary Tract: Adrenal glands are unremarkable. The native kidneys are atrophic in appearance, without renal calculi, focal lesion, or hydronephrosis. A normal appearing transplanted kidney is seen within the pelvis on the right bladder is unremarkable. Stomach/Bowel: Stomach is within normal limits. Appendix appears normal. No evidence of bowel wall thickening, distention, or inflammatory changes. Vascular/Lymphatic: Aortic atherosclerosis. No enlarged abdominal or pelvic lymph nodes. Reproductive: The uterus and right adnexa are unremarkable. A cluster of ill-defined, subcentimeter cysts are seen within the left ovary. Other: No abdominal wall hernia or abnormality. No abdominopelvic ascites. Musculoskeletal: No acute or significant osseous findings. IMPRESSION: 1. Cluster of ill-defined, subcentimeter cysts within the left ovary. No follow-up imaging is recommended. This recommendation follows ACR consensus guidelines: White Paper of the ACR Incidental Findings Committee II on Adnexal Findings. J Am Coll Radiol 2013:10:675-681. 2. Evidence of prior cholecystectomy. 3. Normal appearing transplanted kidney within the pelvis on the right. 4. Aortic atherosclerosis. Aortic Atherosclerosis (ICD10-I70.0). Electronically Signed   By: Aram Candela M.D.   On: 08/27/2022 00:03    Medications: Infusions:   Scheduled Medications:  ARIPiprazole  30 mg Oral Daily   atorvastatin  10 mg Oral Daily   buPROPion  450 mg Oral Daily   hydrOXYzine  25 mg Oral QHS   insulin pump   Subcutaneous Q4H   labetalol  400 mg Oral TID   montelukast  10 mg Oral QHS   mycophenolate  720 mg Oral BID   potassium chloride  40 mEq Oral Q4H   predniSONE  5 mg Oral Q breakfast   sodium bicarbonate  1,300 mg Oral BID   tacrolimus  4 mg Oral QHS   tacrolimus  5 mg Oral Daily    have reviewed scheduled and prn medications.  Physical Exam: General:NAD, comfortable Heart:RRR, s1s2 nl Lungs:clear  b/l, no crackle Abdomen:soft, Non-tender, non-distended Extremities:No edema Neurology: Alert, awake and following commands  Linda Ellison Prasad Solomon Skowronek 08/28/2022,10:31 AM  LOS: 2 days

## 2022-10-09 ENCOUNTER — Emergency Department (HOSPITAL_BASED_OUTPATIENT_CLINIC_OR_DEPARTMENT_OTHER): Payer: Medicare (Managed Care)

## 2022-10-09 ENCOUNTER — Encounter (HOSPITAL_BASED_OUTPATIENT_CLINIC_OR_DEPARTMENT_OTHER): Payer: Self-pay | Admitting: Emergency Medicine

## 2022-10-09 ENCOUNTER — Other Ambulatory Visit: Payer: Self-pay

## 2022-10-09 ENCOUNTER — Inpatient Hospital Stay (HOSPITAL_BASED_OUTPATIENT_CLINIC_OR_DEPARTMENT_OTHER)
Admission: EM | Admit: 2022-10-09 | Discharge: 2022-11-13 | Disposition: A | Discharge status: Home / Self Care | Payer: Medicare (Managed Care) | Source: Home / Self Care | Attending: Internal Medicine | Admitting: Internal Medicine

## 2022-10-09 DIAGNOSIS — T7840XA Allergy, unspecified, initial encounter: Secondary | ICD-10-CM | POA: Diagnosis present

## 2022-10-09 DIAGNOSIS — D84821 Immunodeficiency due to drugs: Secondary | ICD-10-CM | POA: Diagnosis not present

## 2022-10-09 DIAGNOSIS — N179 Acute kidney failure, unspecified: Secondary | ICD-10-CM | POA: Diagnosis present

## 2022-10-09 DIAGNOSIS — I509 Heart failure, unspecified: Secondary | ICD-10-CM | POA: Diagnosis present

## 2022-10-09 DIAGNOSIS — K219 Gastro-esophageal reflux disease without esophagitis: Secondary | ICD-10-CM | POA: Diagnosis present

## 2022-10-09 DIAGNOSIS — I10 Essential (primary) hypertension: Secondary | ICD-10-CM | POA: Insufficient documentation

## 2022-10-09 DIAGNOSIS — T8619 Other complication of kidney transplant: Secondary | ICD-10-CM | POA: Diagnosis present

## 2022-10-09 DIAGNOSIS — N170 Acute kidney failure with tubular necrosis: Secondary | ICD-10-CM | POA: Diagnosis not present

## 2022-10-09 DIAGNOSIS — E875 Hyperkalemia: Secondary | ICD-10-CM | POA: Diagnosis present

## 2022-10-09 DIAGNOSIS — F411 Generalized anxiety disorder: Secondary | ICD-10-CM | POA: Insufficient documentation

## 2022-10-09 DIAGNOSIS — E1143 Type 2 diabetes mellitus with diabetic autonomic (poly)neuropathy: Secondary | ICD-10-CM | POA: Diagnosis present

## 2022-10-09 DIAGNOSIS — Z79899 Other long term (current) drug therapy: Secondary | ICD-10-CM

## 2022-10-09 DIAGNOSIS — Z8701 Personal history of pneumonia (recurrent): Secondary | ICD-10-CM

## 2022-10-09 DIAGNOSIS — J80 Acute respiratory distress syndrome: Secondary | ICD-10-CM | POA: Diagnosis present

## 2022-10-09 DIAGNOSIS — Z6841 Body Mass Index (BMI) 40.0 and over, adult: Secondary | ICD-10-CM

## 2022-10-09 DIAGNOSIS — E109 Type 1 diabetes mellitus without complications: Secondary | ICD-10-CM | POA: Diagnosis present

## 2022-10-09 DIAGNOSIS — J188 Other pneumonia, unspecified organism: Secondary | ICD-10-CM | POA: Diagnosis present

## 2022-10-09 DIAGNOSIS — D631 Anemia in chronic kidney disease: Secondary | ICD-10-CM | POA: Diagnosis present

## 2022-10-09 DIAGNOSIS — R4189 Other symptoms and signs involving cognitive functions and awareness: Secondary | ICD-10-CM

## 2022-10-09 DIAGNOSIS — G9341 Metabolic encephalopathy: Secondary | ICD-10-CM | POA: Diagnosis not present

## 2022-10-09 DIAGNOSIS — R079 Chest pain, unspecified: Secondary | ICD-10-CM | POA: Diagnosis present

## 2022-10-09 DIAGNOSIS — K529 Noninfective gastroenteritis and colitis, unspecified: Secondary | ICD-10-CM | POA: Diagnosis not present

## 2022-10-09 DIAGNOSIS — J159 Unspecified bacterial pneumonia: Secondary | ICD-10-CM | POA: Diagnosis present

## 2022-10-09 DIAGNOSIS — E039 Hypothyroidism, unspecified: Secondary | ICD-10-CM | POA: Diagnosis present

## 2022-10-09 DIAGNOSIS — I158 Other secondary hypertension: Secondary | ICD-10-CM | POA: Diagnosis present

## 2022-10-09 DIAGNOSIS — E872 Acidosis, unspecified: Secondary | ICD-10-CM | POA: Diagnosis present

## 2022-10-09 DIAGNOSIS — J690 Pneumonitis due to inhalation of food and vomit: Secondary | ICD-10-CM | POA: Diagnosis not present

## 2022-10-09 DIAGNOSIS — T380X5A Adverse effect of glucocorticoids and synthetic analogues, initial encounter: Secondary | ICD-10-CM | POA: Diagnosis present

## 2022-10-09 DIAGNOSIS — K3184 Gastroparesis: Secondary | ICD-10-CM | POA: Diagnosis not present

## 2022-10-09 DIAGNOSIS — Z79621 Long term (current) use of calcineurin inhibitor: Secondary | ICD-10-CM

## 2022-10-09 DIAGNOSIS — Z751 Person awaiting admission to adequate facility elsewhere: Secondary | ICD-10-CM

## 2022-10-09 DIAGNOSIS — J90 Pleural effusion, not elsewhere classified: Secondary | ICD-10-CM | POA: Diagnosis present

## 2022-10-09 DIAGNOSIS — J189 Pneumonia, unspecified organism: Principal | ICD-10-CM | POA: Diagnosis present

## 2022-10-09 DIAGNOSIS — E1129 Type 2 diabetes mellitus with other diabetic kidney complication: Secondary | ICD-10-CM | POA: Diagnosis present

## 2022-10-09 DIAGNOSIS — J9601 Acute respiratory failure with hypoxia: Secondary | ICD-10-CM

## 2022-10-09 DIAGNOSIS — K567 Ileus, unspecified: Secondary | ICD-10-CM | POA: Diagnosis not present

## 2022-10-09 DIAGNOSIS — Z7969 Long term (current) use of other immunomodulators and immunosuppressants: Secondary | ICD-10-CM

## 2022-10-09 DIAGNOSIS — A419 Sepsis, unspecified organism: Secondary | ICD-10-CM | POA: Diagnosis not present

## 2022-10-09 DIAGNOSIS — E1065 Type 1 diabetes mellitus with hyperglycemia: Secondary | ICD-10-CM | POA: Diagnosis present

## 2022-10-09 DIAGNOSIS — I3139 Other pericardial effusion (noninflammatory): Secondary | ICD-10-CM | POA: Diagnosis present

## 2022-10-09 DIAGNOSIS — N186 End stage renal disease: Secondary | ICD-10-CM | POA: Diagnosis not present

## 2022-10-09 DIAGNOSIS — J9382 Other air leak: Secondary | ICD-10-CM | POA: Diagnosis not present

## 2022-10-09 DIAGNOSIS — Z794 Long term (current) use of insulin: Secondary | ICD-10-CM

## 2022-10-09 DIAGNOSIS — D6489 Other specified anemias: Secondary | ICD-10-CM | POA: Diagnosis present

## 2022-10-09 DIAGNOSIS — G629 Polyneuropathy, unspecified: Secondary | ICD-10-CM

## 2022-10-09 DIAGNOSIS — T451X5A Adverse effect of antineoplastic and immunosuppressive drugs, initial encounter: Secondary | ICD-10-CM | POA: Diagnosis not present

## 2022-10-09 DIAGNOSIS — R6521 Severe sepsis with septic shock: Secondary | ICD-10-CM | POA: Diagnosis not present

## 2022-10-09 DIAGNOSIS — G4733 Obstructive sleep apnea (adult) (pediatric): Secondary | ICD-10-CM | POA: Diagnosis present

## 2022-10-09 DIAGNOSIS — E876 Hypokalemia: Secondary | ICD-10-CM | POA: Diagnosis present

## 2022-10-09 DIAGNOSIS — D649 Anemia, unspecified: Secondary | ICD-10-CM | POA: Diagnosis present

## 2022-10-09 DIAGNOSIS — Z992 Dependence on renal dialysis: Secondary | ICD-10-CM

## 2022-10-09 DIAGNOSIS — Z881 Allergy status to other antibiotic agents status: Secondary | ICD-10-CM

## 2022-10-09 DIAGNOSIS — Z9049 Acquired absence of other specified parts of digestive tract: Secondary | ICD-10-CM

## 2022-10-09 DIAGNOSIS — E0590 Thyrotoxicosis, unspecified without thyrotoxic crisis or storm: Secondary | ICD-10-CM | POA: Diagnosis present

## 2022-10-09 DIAGNOSIS — G4700 Insomnia, unspecified: Secondary | ICD-10-CM | POA: Diagnosis present

## 2022-10-09 DIAGNOSIS — Z7952 Long term (current) use of systemic steroids: Secondary | ICD-10-CM

## 2022-10-09 DIAGNOSIS — E1042 Type 1 diabetes mellitus with diabetic polyneuropathy: Secondary | ICD-10-CM | POA: Diagnosis present

## 2022-10-09 DIAGNOSIS — I1A0 Resistant hypertension: Secondary | ICD-10-CM | POA: Diagnosis present

## 2022-10-09 DIAGNOSIS — I132 Hypertensive heart and chronic kidney disease with heart failure and with stage 5 chronic kidney disease, or end stage renal disease: Secondary | ICD-10-CM | POA: Diagnosis present

## 2022-10-09 DIAGNOSIS — R21 Rash and other nonspecific skin eruption: Secondary | ICD-10-CM | POA: Diagnosis not present

## 2022-10-09 DIAGNOSIS — R9431 Abnormal electrocardiogram [ECG] [EKG]: Secondary | ICD-10-CM

## 2022-10-09 DIAGNOSIS — Z9109 Other allergy status, other than to drugs and biological substances: Secondary | ICD-10-CM

## 2022-10-09 DIAGNOSIS — E871 Hypo-osmolality and hyponatremia: Secondary | ICD-10-CM | POA: Diagnosis present

## 2022-10-09 DIAGNOSIS — F32A Depression, unspecified: Secondary | ICD-10-CM | POA: Diagnosis not present

## 2022-10-09 DIAGNOSIS — N2581 Secondary hyperparathyroidism of renal origin: Secondary | ICD-10-CM | POA: Diagnosis present

## 2022-10-09 DIAGNOSIS — Z833 Family history of diabetes mellitus: Secondary | ICD-10-CM

## 2022-10-09 DIAGNOSIS — Z8679 Personal history of other diseases of the circulatory system: Secondary | ICD-10-CM

## 2022-10-09 DIAGNOSIS — Z1152 Encounter for screening for COVID-19: Secondary | ICD-10-CM

## 2022-10-09 DIAGNOSIS — Z9641 Presence of insulin pump (external) (internal): Secondary | ICD-10-CM | POA: Diagnosis present

## 2022-10-09 DIAGNOSIS — G250 Essential tremor: Secondary | ICD-10-CM | POA: Diagnosis present

## 2022-10-09 DIAGNOSIS — Z883 Allergy status to other anti-infective agents status: Secondary | ICD-10-CM

## 2022-10-09 DIAGNOSIS — E7800 Pure hypercholesterolemia, unspecified: Secondary | ICD-10-CM | POA: Diagnosis present

## 2022-10-09 DIAGNOSIS — Y830 Surgical operation with transplant of whole organ as the cause of abnormal reaction of the patient, or of later complication, without mention of misadventure at the time of the procedure: Secondary | ICD-10-CM | POA: Diagnosis present

## 2022-10-09 DIAGNOSIS — E1022 Type 1 diabetes mellitus with diabetic chronic kidney disease: Secondary | ICD-10-CM

## 2022-10-09 DIAGNOSIS — Z885 Allergy status to narcotic agent status: Secondary | ICD-10-CM

## 2022-10-09 DIAGNOSIS — E1043 Type 1 diabetes mellitus with diabetic autonomic (poly)neuropathy: Secondary | ICD-10-CM | POA: Diagnosis present

## 2022-10-09 DIAGNOSIS — Z841 Family history of disorders of kidney and ureter: Secondary | ICD-10-CM

## 2022-10-09 DIAGNOSIS — I2510 Atherosclerotic heart disease of native coronary artery without angina pectoris: Secondary | ICD-10-CM | POA: Diagnosis present

## 2022-10-09 DIAGNOSIS — Z888 Allergy status to other drugs, medicaments and biological substances status: Secondary | ICD-10-CM

## 2022-10-09 DIAGNOSIS — D849 Immunodeficiency, unspecified: Secondary | ICD-10-CM | POA: Diagnosis present

## 2022-10-09 DIAGNOSIS — Z91013 Allergy to seafood: Secondary | ICD-10-CM

## 2022-10-09 DIAGNOSIS — Z9851 Tubal ligation status: Secondary | ICD-10-CM

## 2022-10-09 DIAGNOSIS — I951 Orthostatic hypotension: Secondary | ICD-10-CM | POA: Diagnosis not present

## 2022-10-09 DIAGNOSIS — R001 Bradycardia, unspecified: Secondary | ICD-10-CM | POA: Diagnosis not present

## 2022-10-09 DIAGNOSIS — Z7985 Long-term (current) use of injectable non-insulin antidiabetic drugs: Secondary | ICD-10-CM

## 2022-10-09 DIAGNOSIS — Z8249 Family history of ischemic heart disease and other diseases of the circulatory system: Secondary | ICD-10-CM

## 2022-10-09 DIAGNOSIS — J45901 Unspecified asthma with (acute) exacerbation: Secondary | ICD-10-CM | POA: Diagnosis present

## 2022-10-09 DIAGNOSIS — Z91018 Allergy to other foods: Secondary | ICD-10-CM

## 2022-10-09 LAB — CBC WITH DIFFERENTIAL/PLATELET
Abs Immature Granulocytes: 0.37 10*3/uL — ABNORMAL HIGH (ref 0.00–0.07)
Basophils Absolute: 0 10*3/uL (ref 0.0–0.1)
Basophils Relative: 0 %
Eosinophils Absolute: 0 10*3/uL (ref 0.0–0.5)
Eosinophils Relative: 0 %
HCT: 29.8 % — ABNORMAL LOW (ref 36.0–46.0)
Hemoglobin: 9.9 g/dL — ABNORMAL LOW (ref 12.0–15.0)
Immature Granulocytes: 2 %
Lymphocytes Relative: 7 %
Lymphs Abs: 1.3 10*3/uL (ref 0.7–4.0)
MCH: 25.6 pg — ABNORMAL LOW (ref 26.0–34.0)
MCHC: 33.2 g/dL (ref 30.0–36.0)
MCV: 77 fL — ABNORMAL LOW (ref 80.0–100.0)
Monocytes Absolute: 1.7 10*3/uL — ABNORMAL HIGH (ref 0.1–1.0)
Monocytes Relative: 10 %
Neutro Abs: 13.5 10*3/uL — ABNORMAL HIGH (ref 1.7–7.7)
Neutrophils Relative %: 81 %
Platelets: 365 10*3/uL (ref 150–400)
RBC: 3.87 MIL/uL (ref 3.87–5.11)
RDW: 13.7 % (ref 11.5–15.5)
WBC: 16.9 10*3/uL — ABNORMAL HIGH (ref 4.0–10.5)
nRBC: 0.1 % (ref 0.0–0.2)

## 2022-10-09 LAB — RESP PANEL BY RT-PCR (RSV, FLU A&B, COVID)  RVPGX2
Influenza A by PCR: NEGATIVE
Influenza B by PCR: NEGATIVE
Resp Syncytial Virus by PCR: NEGATIVE
SARS Coronavirus 2 by RT PCR: NEGATIVE

## 2022-10-09 LAB — BASIC METABOLIC PANEL
Anion gap: 17 — ABNORMAL HIGH (ref 5–15)
BUN: 71 mg/dL — ABNORMAL HIGH (ref 6–20)
CO2: 20 mmol/L — ABNORMAL LOW (ref 22–32)
Calcium: 6.8 mg/dL — ABNORMAL LOW (ref 8.9–10.3)
Chloride: 94 mmol/L — ABNORMAL LOW (ref 98–111)
Creatinine, Ser: 5.05 mg/dL — ABNORMAL HIGH (ref 0.44–1.00)
GFR, Estimated: 10 mL/min — ABNORMAL LOW (ref >60)
Glucose, Bld: 138 mg/dL — ABNORMAL HIGH (ref 70–99)
Potassium: 2.5 mmol/L — CL (ref 3.5–5.1)
Sodium: 131 mmol/L — ABNORMAL LOW (ref 135–145)

## 2022-10-09 LAB — TROPONIN I (HIGH SENSITIVITY)
Troponin I (High Sensitivity): 16 ng/L (ref <18)
Troponin I (High Sensitivity): 14 ng/L (ref <18)

## 2022-10-09 LAB — MAGNESIUM: Magnesium: 1.5 mg/dL — ABNORMAL LOW (ref 1.7–2.4)

## 2022-10-09 MED ORDER — LACTATED RINGERS IV BOLUS
1000.0000 mL | Freq: Once | INTRAVENOUS | Status: AC
  Administered 2022-10-09: 1000 mL via INTRAVENOUS

## 2022-10-09 MED ORDER — SODIUM CHLORIDE 0.9 % IV SOLN: 1.0000 g | INTRAVENOUS | Status: DC

## 2022-10-09 MED ORDER — SODIUM CHLORIDE 0.9 % IV SOLN: 2.0000 g | INTRAVENOUS | Status: DC

## 2022-10-09 MED ORDER — SODIUM CHLORIDE 0.9 % IV SOLN
2.0000 g | Freq: Once | INTRAVENOUS | Status: AC
  Administered 2022-10-10: 2 g via INTRAVENOUS
  Filled 2022-10-09 (×2): qty 12.5

## 2022-10-09 MED ORDER — POTASSIUM CHLORIDE CRYS ER 20 MEQ PO TBCR
40.0000 meq | EXTENDED_RELEASE_TABLET | Freq: Once | ORAL | Status: AC
  Administered 2022-10-09: 40 meq via ORAL
  Filled 2022-10-09: qty 2

## 2022-10-09 MED ORDER — MAGNESIUM OXIDE -MG SUPPLEMENT 400 (240 MG) MG PO TABS
400.0000 mg | ORAL_TABLET | Freq: Once | ORAL | Status: AC
  Administered 2022-10-09: 400 mg via ORAL
  Filled 2022-10-09: qty 1

## 2022-10-09 MED ORDER — SODIUM CHLORIDE 0.9 % IV SOLN
100.0000 mg | Freq: Once | INTRAVENOUS | Status: AC
  Administered 2022-10-09: 100 mg via INTRAVENOUS
  Filled 2022-10-09: qty 100

## 2022-10-09 MED ORDER — SODIUM CHLORIDE 0.9 % IV SOLN: INTRAVENOUS | Status: DC | PRN

## 2022-10-09 MED ORDER — POTASSIUM CHLORIDE 10 MEQ/100ML IV SOLN
10.0000 meq | INTRAVENOUS | Status: AC
  Administered 2022-10-09 (×2): 10 meq via INTRAVENOUS
  Filled 2022-10-09 (×2): qty 100

## 2022-10-09 NOTE — ED Notes (Signed)
Report called to Travis, RN.

## 2022-10-09 NOTE — ED Notes (Signed)
[facility] on the unit, report & paperwork given, pt belongings labeling in bag and sent with [facility]

## 2022-10-09 NOTE — ED Notes (Signed)
Pt asked for UA sample, still reports unable to go, will wait for bolus to finish

## 2022-10-09 NOTE — ED Triage Notes (Signed)
Pt c/o CP, all over body pain x 5; sts feel similar to to when her K+ was low

## 2022-10-09 NOTE — ED Notes (Signed)
Attempted to pull Cefepime from Pyxis, shows as not available

## 2022-10-09 NOTE — ED Notes (Signed)
[facility] called for transport. 

## 2022-10-09 NOTE — Progress Notes (Signed)
Pharmacy Antibiotic Note  Linda Ellison is a 47 y.o. female for which pharmacy has been consulted for cefepime dosing for pneumonia.  ESRD WBC 16.9; T 97.9; HR 81; RR 20 COVID neg / flu neg  Plan: Cefepime 1g q24hr Monitor WBC, fever, renal function, cultures De-escalate when able  Height: 5' 6.5" (168.9 cm) Weight: 114.3 kg (252 lb) IBW/kg (Calculated) : 60.45  Temp (24hrs), Avg:97.9 F (36.6 C), Min:97.9 F (36.6 C), Max:97.9 F (36.6 C)  Recent Labs  Lab 10/09/22 2009  WBC 16.9*  CREATININE 5.05*    Estimated Creatinine Clearance: 18 mL/min (A) (by C-G formula based on SCr of 5.05 mg/dL (H)).    Allergies  Allergen Reactions   Ciprofloxacin Hives, Itching and Nausea And Vomiting   Fluocinolone Itching, Nausea And Vomiting and Other (See Comments)   Hydromorphone Hives   Strawberry Extract Itching and Other (See Comments)    Pt states that this medication makes her tongue raw.   No reaction   Hydromorphone Hcl Hives   Phenytoin Sodium Extended Itching   Adhesive [Tape] Itching and Rash   Chlorhexidine Gluconate Itching   Clindamycin Diarrhea, Nausea And Vomiting and Rash   Oxycodone Nausea And Vomiting, Rash and Other (See Comments)    Reaction:  Hallucinations    Shrimp [Shellfish Allergy] Rash    Microbiology results: Pending  Thank you for allowing pharmacy to be a part of this patient's care.  Delmar Landau, PharmD, BCPS 10/09/2022 8:40 PM ED Clinical Pharmacist -  [phone]

## 2022-10-09 NOTE — ED Provider Notes (Signed)
[facility] Provider Note   CSN: [id number] Arrival date & time: 10/09/22  7829     History  Chief Complaint  Patient presents with   Chest Pain    Linda Ellison is a 47 y.o. female with ESRD due to diabetic nephropathy now s/p deceased donor renal transplant 10/2016 complicated by renal artery stenosis in 2019 s/p angioplasty and stent of right renal artery, T1DM, GERD, migraine, OSA, prolonged QT, hypothyroidism, heart failure, h/o CMV viremia, presents with right-sided CP radiating to her back, dyspnea on exertion and shortness of breath, all over body pain x 5; sts feel similar to to when her K+ was low.  Denies any pain over her kidney transplant.  Has been compliant with her rejection meds.  Denies any nausea vomiting, abdominal pain, urinary symptoms, lower extremity edema.  Per chart review she was recently admitted from 08/26/2022 to 08/28/2022 in the setting of norovirus, hypokalemia/hypomagnesemia.  She is on Myfortic twice daily, Prograf twice daily, and prednisone 5 mg daily.  Also on Bactrim 3 times a week.    Chest Pain      Home Medications Prior to Admission medications   Medication Sig Start Date End Date Taking? Authorizing Provider  albuterol (PROVENTIL HFA;VENTOLIN HFA) 108 (90 Base) MCG/ACT inhaler Inhale 2 puffs into the lungs every 6 (six) hours as needed for wheezing or shortness of breath. 11/25/17   Armandina Stammer I, NP  amLODipine (NORVASC) 10 MG tablet Take 1 tablet by mouth daily. 02/11/22   [provider]  ARIPiprazole (ABILIFY) 30 MG tablet Take 30 mg by mouth daily. 01/26/22   [provider]  atorvastatin (LIPITOR) 10 MG tablet Take 10 mg by mouth daily. 02/08/22   [provider]  buPROPion (WELLBUTRIN XL) 150 MG 24 hr tablet Take 1 tablet (150 mg total) by mouth daily after breakfast. For depression Patient taking differently: Take 150 mg by mouth daily after breakfast. Take with 300mg   tablet for a total daily dose of 450mg  for depression 11/26/17   Armandina Stammer I, NP  buPROPion (WELLBUTRIN XL) 300 MG 24 hr tablet Take 300 mg by mouth daily.    [provider]  cetirizine (ZYRTEC) 10 MG tablet Take 10 mg by mouth 2 (two) times daily.    [provider]  cinacalcet (SENSIPAR) 30 MG tablet Take 30 mg by mouth daily. 03/10/22   [provider]  citalopram (CELEXA) 40 MG tablet Take 40 mg by mouth daily.    [provider]  cloNIDine (CATAPRES) 0.3 MG tablet Take 0.3 mg by mouth 3 (three) times daily.     [provider]  Dulaglutide 4.5 MG/0.5ML SOPN Inject 4.5 mg into the skin once a week. Every Tuesday 12/09/21   [provider]  gabapentin (NEURONTIN) 300 MG capsule Take 300 mg by mouth at bedtime as needed. pain    [provider]  hydrOXYzine (ATARAX) 25 MG tablet Take 25 mg by mouth at bedtime.    [provider]  insulin aspart (NOVOLOG) 100 UNIT/ML injection USE UP TO 100 UNITS ONCE DAILY IN INSULIN PUMP. DX CODE E11.42 03/10/22   [provider]  Insulin Human (INSULIN PUMP) SOLN Inject 1 each into the skin See admin instructions.    [provider]  labetalol (NORMODYNE) 200 MG tablet Take 2 tablets (400 mg total) by mouth 3 (three) times daily. For high blood pressure 11/25/17   Nwoko, Nelda Marseille, NP  montelukast (SINGULAIR)  10 MG tablet Take 10 mg by mouth at bedtime.    [provider]  mycophenolate (MYFORTIC) 180 MG EC tablet Take 720 mg by mouth 2 (two) times daily.    [provider]  naloxone [facility]) nasal spray 4 mg/0.1 mL  10/28/21   [provider]  Oxycodone HCl 10 MG TABS Take 10 mg by mouth See admin instructions. Take 1 tablet by mouth by mouth every 4 to 6 hours as needed for sciatic nerve and back pain    [provider]  pantoprazole (PROTONIX) 40 MG tablet Take 1 tablet (40 mg total) by mouth daily. For acid reflux 11/26/17   Armandina Stammer I,  NP  prazosin (MINIPRESS) 1 MG capsule Take 1 mg by mouth at bedtime.    [provider]  predniSONE (DELTASONE) 5 MG tablet Take 5 mg by mouth daily with breakfast.    [provider]  simvastatin (ZOCOR) 10 MG tablet Take 1 tablet (10 mg total) by mouth at bedtime. For high cholesterol 11/25/17   Armandina Stammer I, NP  sodium bicarbonate 650 MG tablet Take 1,300 mg by mouth 2 (two) times daily.    [provider]  sulfamethoxazole-trimethoprim (BACTRIM,SEPTRA) 400-80 MG tablet Take 1 tablet by mouth 3 (three) times a week. For infection Patient taking differently: Take 1 tablet by mouth every Monday, Wednesday, and Friday. For infection 11/28/17   Armandina Stammer I, NP  tacrolimus (PROGRAF) 1 MG capsule Take 4-5 mg by mouth See admin instructions. Take 5 capsules by mouth in the morning and 4 capsules at night    [provider]  zolpidem (AMBIEN) 5 MG tablet Take 1 tablet (5 mg total) by mouth at bedtime as needed for sleep. 11/25/17   Armandina Stammer I, NP      Allergies    Ciprofloxacin, Fluocinolone, Hydromorphone, Strawberry extract, Hydromorphone hcl, Phenytoin sodium extended, Adhesive [tape], Chlorhexidine gluconate, Clindamycin, Oxycodone, and Shrimp [shellfish allergy]    Review of Systems   Review of Systems  Cardiovascular:  Positive for chest pain.   A 10 point review of systems was performed and is negative unless otherwise reported in HPI.  Physical Exam Updated Vital Signs BP 127/61   Pulse 81   Temp 97.9 F (36.6 C) (Oral)   Resp 20   Ht 5' 6.5" (1.689 m)   Wt 114.3 kg   SpO2 94%   BMI 40.06 kg/m  Physical Exam General: Normal appearing female, lying in bed.  HEENT: PERRLA, Sclera anicteric, MMM, trachea midline.  Cardiology: RRR, no murmurs/rubs/gallops. BL radial and DP pulses equal bilaterally.  Resp: Normal respiratory rate and effort. CTAB, no wheezes, rhonchi, crackles.  Abd: Soft, non-tender, non-distended. No rebound tenderness or  guarding.  GU: Deferred. MSK: No peripheral edema or signs of trauma. Extremities without deformity or TTP. No cyanosis or clubbing. Skin: warm, dry. No rashes or lesions. Back: No CVA tenderness Neuro: A&Ox4, CNs II-XII grossly intact. MAEs. Sensation grossly intact.  Psych: Normal mood and affect.   ED Results / Procedures / Treatments   Labs (all labs ordered are listed, but only abnormal results are displayed) Labs Reviewed  BASIC METABOLIC PANEL - Abnormal; Notable for the following components:      Result Value   Sodium 131 (*)    Potassium 2.5 (*)    Chloride 94 (*)    CO2 20 (*)    Glucose, Bld 138 (*)    BUN 71 (*)    Creatinine, Ser 5.05 (*)  Calcium 6.8 (*)    GFR, Estimated 10 (*)    Anion gap 17 (*)    All other components within normal limits  CBC WITH DIFFERENTIAL/PLATELET - Abnormal; Notable for the following components:   WBC 16.9 (*)    Hemoglobin 9.9 (*)    HCT 29.8 (*)    MCV 77.0 (*)    MCH 25.6 (*)    Neutro Abs 13.5 (*)    Monocytes Absolute 1.7 (*)    Abs Immature Granulocytes 0.37 (*)    All other components within normal limits  MAGNESIUM - Abnormal; Notable for the following components:   Magnesium 1.5 (*)    All other components within normal limits  RESP PANEL BY RT-PCR (RSV, FLU A&B, COVID)  [id number]  CULTURE, BLOOD (ROUTINE X 2)  CULTURE, BLOOD (ROUTINE X 2)  PREGNANCY, URINE  URINALYSIS, W/ REFLEX TO CULTURE (INFECTION SUSPECTED)  TROPONIN I (HIGH SENSITIVITY)  TROPONIN I (HIGH SENSITIVITY)    EKG EKG Interpretation Date/Time:  Saturday October 09 2022 18:49:10 EDT Ventricular Rate:  80 PR Interval:  132 QRS Duration:  97 QT Interval:  546 QTC Calculation: 630 R Axis:   41  Text Interpretation: Sinus rhythm Prolonged QT interval Confirmed by Vivi Barrack [phone]) on 10/09/2022 6:53:15 PM  Radiology DG Chest 2 View  Result Date: 10/09/2022 CLINICAL DATA:  Chest pain and shortness of breath EXAM: CHEST - 2 VIEW COMPARISON:  Chest  x-ray 08/27/2018 FINDINGS: There is airspace disease in the inferior right upper lobe and right lower lobe. There is a small right pleural effusion. Cardiomediastinal silhouette is within normal limits. No acute fractures are seen. IMPRESSION: 1. Airspace disease in the inferior right upper lobe and right lower lobe, concerning for multifocal pneumonia. Follow-up imaging recommended in 4-6 weeks to confirm resolution. 2. Small right pleural effusion. Electronically Signed   By: Darliss Cheney M.D.   On: 10/09/2022 19:14    Procedures Procedures    Medications Ordered in ED Medications  potassium chloride 10 mEq in 100 mL IVPB (10 mEq Intravenous New Bag/Given 10/09/22 2103)  ceFEPIme (MAXIPIME) 1 g in sodium chloride 0.9 % 100 mL IVPB (has no administration in time range)  doxycycline (VIBRAMYCIN) 100 mg in sodium chloride 0.9 % 250 mL IVPB (has no administration in time range)  lactated ringers bolus 1,000 mL (1,000 mLs Intravenous New Bag/Given 10/09/22 2103)  potassium chloride SA (KLOR-CON M) CR tablet 40 mEq (40 mEq Oral Given 10/09/22 2104)  magnesium oxide (MAG-OX) tablet 400 mg (400 mg Oral Given 10/09/22 2104)    ED Course/ Medical Decision Making/ A&P                          Medical Decision Making Amount and/or Complexity of Data Reviewed Labs: ordered. Decision-making details documented in ED Course. Radiology: ordered. Decision-making details documented in ED Course.  Risk OTC drugs. Prescription drug management. Decision regarding hospitalization.    This patient presents to the ED for concern of chest pain, shortness of breath, this involves an extensive number of treatment options, and is a complaint that carries with it a high risk of complications and morbidity.  I considered the following differential and admission for this acute, potentially life threatening condition.  Patient is hypoxic on room air to 89% and is placed on 2 L nasal cannula now satting 93%.  MDM:     DDX for chest pain includes but is not limited to:  Patient with  right-sided chest pain found to have multifocal pneumonia in her right lung.  She is leukocytosis and also AKI in the setting of her renal transplant.  Will obtain renal ultrasound to evaluate the Doppler flow of the transplant; she has no pain over the transplant, could consider rejection as possible cause.  No abdominal pain or back pain/flank pain to raise concern for pyelonephritis/nephrolithiasis/ureterolithiasis. Pt also w/ concerns for low K and labs show hypokalemia 2.5.  Likely due to AKI and will replete K and mag.  Her hemoglobin has dropped approximately 1 point since last month as well, now hemoglobin 9.9, no report of any bleeding from anywhere.  She is afebrile and not tachycardic however her immunosuppressed status may not demonstrate SIRS signs and so we will obtain blood cultures to rule out bacteremia.  Will give her broad-spectrum IV antibiotics in consultation with pharmacy and given her immunosuppressed status, source of infection, and hypoxia, patient will be consulted to hospitalist for admission.   Further causes of chest pain, EKG does not demonstrate any signs of ischemia and initial Theodis Aguas is 16 and negative.  She has no asymmetric lower extremity edema and in her clinical context I have lower concern for PE or dissection.   Clinical Course as of 10/09/22 2116  Sat Oct 09, 2022  2014 DG Chest 2 View 1. Airspace disease in the inferior right upper lobe and right lower lobe, concerning for multifocal pneumonia. Follow-up imaging recommended in 4-6 weeks to confirm resolution. 2. Small right pleural effusion.   [HN]  2031 WBC(!): 16.9 +Leukocytosis with left shift [HN]  2051 Potassium(!!): 2.5 Will replete orally and IV. EKG w/ h/o prolonged QT and today is even more prolonged, 630 ms, likely d/t hypokalemia/hypomagnesemia. [HN]  2052 Creatinine(!): 5.05 AKI in s/o DDRT [HN]  2114 With prolonged QT,  will treat CAP with cefepime/doxycycline IV. Admitted to medicine. [HN]    Clinical Course User Index [HN] Loetta Rough, MD    Labs: I Ordered, and personally interpreted labs.  The pertinent results include:  those listed above  Imaging Studies ordered: I ordered imaging studies including CXR and renal ultrasound transplant, which is pending at time of admission I independently visualized and interpreted imaging. I agree with the radiologist interpretation  Additional history obtained from chart review.    Cardiac Monitoring: The patient was maintained on a cardiac monitor.  I personally viewed and interpreted the cardiac monitored which showed an underlying rhythm of: NSR  Reevaluation: After the interventions noted above, I reevaluated the patient and found that they have :stayed the same  Social Determinants of Health: Patient lives independently   Disposition:  Admit  Co morbidities that complicate the patient evaluation  Past Medical History:  Diagnosis Date   Allergy    Anemia associated with chronic renal failure    Anxiety    Asthma    Atypical chest pain    long-standing -- normal cardio cath 06-27-2012 and normal nuclear stress test 06-25-2016   AV (arteriovenous fistula) (HCC)    for dialysis-- currently located left radiocephalic   CAD (coronary artery disease) cardiologist-- dr Sharol Roussel [facility]- [facility] in [place])   a. False positive stress echo 06/2012 at [facility] - cath with no obstructive CAD at [facility] (mild luminal irregularities in LAD, moderate diffuse disease in distal RCA).   Depression    Elevated lipids    ESRD on hemodialysis [facility]) NEPHROLOGIST-  DR MATTINGLY   started dialysis 01/29/11: [facility] on  MWF   Gastroparesis    GERD (gastroesophageal reflux disease)    History of pneumonia    HCAP 04/ 2017   Hyperlipidemia    Hyperthyroidism    Menorrhagia    Other secondary hypertension     associated to diabetes -- followed by cardiologist ( dr Sharol Roussel)     Peripheral neuropathy    PONV (postoperative nausea and vomiting)    Pre-transplant evaluation for kidney transplant    [facility]   Renal failure syndrome 03/05/2022   Secondary hyperparathyroidism of renal origin [facility])    Sleep apnea    Type 2 diabetes mellitus, with long-term current use of insulin (HCC)    dx 1985     Medicines Meds ordered this encounter  Medications   DISCONTD: ceFEPIme (MAXIPIME) 2 g in sodium chloride 0.9 % 100 mL IVPB    Order Specific Question:   Antibiotic Indication:    Answer:   HCAP   lactated ringers bolus 1,000 mL   potassium chloride SA (KLOR-CON M) CR tablet 40 mEq   potassium chloride 10 mEq in 100 mL IVPB   ceFEPIme (MAXIPIME) 1 g in sodium chloride 0.9 % 100 mL IVPB    Order Specific Question:   Antibiotic Indication:    Answer:   HCAP   magnesium oxide (MAG-OX) tablet 400 mg   doxycycline (VIBRAMYCIN) 100 mg in sodium chloride 0.9 % 250 mL IVPB    Order Specific Question:   Antibiotic Indication:    Answer:   CAP    I have reviewed the patients home medicines and have made adjustments as needed  Problem List / ED Course: Problem List Items Addressed This Visit       Respiratory   * (Principal) Multifocal pneumonia - Primary   Relevant Medications   ceFEPIme (MAXIPIME) 1 g in sodium chloride 0.9 % 100 mL IVPB   doxycycline (VIBRAMYCIN) 100 mg in sodium chloride 0.9 % 250 mL IVPB (Start on 10/09/2022  9:30 PM)     Genitourinary   AKI (acute kidney injury) (HCC)     Other   Hypokalemia   Hypomagnesemia   Other Visit Diagnoses     Immunosuppressed status (HCC)       Prolonged Q-T interval on ECG                       This note was created using dictation software, which may contain spelling or grammatical errors.    Loetta Rough, MD 10/09/22 2116

## 2022-10-09 NOTE — ED Notes (Signed)
Critical potassium 2.5 reported to Dr. Jearld Fenton.

## 2022-10-10 ENCOUNTER — Encounter (HOSPITAL_COMMUNITY): Payer: Self-pay | Admitting: Internal Medicine

## 2022-10-10 ENCOUNTER — Observation Stay (HOSPITAL_COMMUNITY): Payer: Medicare (Managed Care)

## 2022-10-10 DIAGNOSIS — N186 End stage renal disease: Secondary | ICD-10-CM | POA: Diagnosis not present

## 2022-10-10 DIAGNOSIS — J189 Pneumonia, unspecified organism: Secondary | ICD-10-CM | POA: Diagnosis not present

## 2022-10-10 DIAGNOSIS — I25119 Atherosclerotic heart disease of native coronary artery with unspecified angina pectoris: Secondary | ICD-10-CM

## 2022-10-10 DIAGNOSIS — N179 Acute kidney failure, unspecified: Secondary | ICD-10-CM | POA: Diagnosis not present

## 2022-10-10 DIAGNOSIS — J45901 Unspecified asthma with (acute) exacerbation: Secondary | ICD-10-CM

## 2022-10-10 DIAGNOSIS — G9341 Metabolic encephalopathy: Secondary | ICD-10-CM | POA: Diagnosis not present

## 2022-10-10 DIAGNOSIS — R079 Chest pain, unspecified: Secondary | ICD-10-CM | POA: Diagnosis present

## 2022-10-10 DIAGNOSIS — J9601 Acute respiratory failure with hypoxia: Secondary | ICD-10-CM

## 2022-10-10 DIAGNOSIS — A419 Sepsis, unspecified organism: Secondary | ICD-10-CM | POA: Diagnosis not present

## 2022-10-10 DIAGNOSIS — Z1152 Encounter for screening for COVID-19: Secondary | ICD-10-CM | POA: Diagnosis not present

## 2022-10-10 DIAGNOSIS — T8619 Other complication of kidney transplant: Secondary | ICD-10-CM | POA: Diagnosis present

## 2022-10-10 DIAGNOSIS — D631 Anemia in chronic kidney disease: Secondary | ICD-10-CM | POA: Diagnosis present

## 2022-10-10 DIAGNOSIS — D849 Immunodeficiency, unspecified: Secondary | ICD-10-CM | POA: Diagnosis not present

## 2022-10-10 DIAGNOSIS — J80 Acute respiratory distress syndrome: Secondary | ICD-10-CM | POA: Diagnosis not present

## 2022-10-10 DIAGNOSIS — K3184 Gastroparesis: Secondary | ICD-10-CM | POA: Diagnosis present

## 2022-10-10 DIAGNOSIS — Y830 Surgical operation with transplant of whole organ as the cause of abnormal reaction of the patient, or of later complication, without mention of misadventure at the time of the procedure: Secondary | ICD-10-CM | POA: Diagnosis present

## 2022-10-10 DIAGNOSIS — I132 Hypertensive heart and chronic kidney disease with heart failure and with stage 5 chronic kidney disease, or end stage renal disease: Secondary | ICD-10-CM | POA: Diagnosis not present

## 2022-10-10 DIAGNOSIS — Z6841 Body Mass Index (BMI) 40.0 and over, adult: Secondary | ICD-10-CM | POA: Diagnosis not present

## 2022-10-10 DIAGNOSIS — I10 Essential (primary) hypertension: Secondary | ICD-10-CM | POA: Insufficient documentation

## 2022-10-10 DIAGNOSIS — E1022 Type 1 diabetes mellitus with diabetic chronic kidney disease: Secondary | ICD-10-CM

## 2022-10-10 DIAGNOSIS — N170 Acute kidney failure with tubular necrosis: Secondary | ICD-10-CM | POA: Diagnosis not present

## 2022-10-10 DIAGNOSIS — J690 Pneumonitis due to inhalation of food and vomit: Secondary | ICD-10-CM | POA: Diagnosis not present

## 2022-10-10 DIAGNOSIS — R6521 Severe sepsis with septic shock: Secondary | ICD-10-CM | POA: Diagnosis not present

## 2022-10-10 DIAGNOSIS — E039 Hypothyroidism, unspecified: Secondary | ICD-10-CM | POA: Diagnosis present

## 2022-10-10 DIAGNOSIS — G629 Polyneuropathy, unspecified: Secondary | ICD-10-CM

## 2022-10-10 DIAGNOSIS — K219 Gastro-esophageal reflux disease without esophagitis: Secondary | ICD-10-CM

## 2022-10-10 DIAGNOSIS — R0609 Other forms of dyspnea: Secondary | ICD-10-CM | POA: Diagnosis not present

## 2022-10-10 DIAGNOSIS — D649 Anemia, unspecified: Secondary | ICD-10-CM

## 2022-10-10 DIAGNOSIS — Z794 Long term (current) use of insulin: Secondary | ICD-10-CM

## 2022-10-10 DIAGNOSIS — E1121 Type 2 diabetes mellitus with diabetic nephropathy: Secondary | ICD-10-CM

## 2022-10-10 DIAGNOSIS — Z992 Dependence on renal dialysis: Secondary | ICD-10-CM | POA: Diagnosis not present

## 2022-10-10 DIAGNOSIS — E1042 Type 1 diabetes mellitus with diabetic polyneuropathy: Secondary | ICD-10-CM | POA: Diagnosis not present

## 2022-10-10 DIAGNOSIS — Z8679 Personal history of other diseases of the circulatory system: Secondary | ICD-10-CM

## 2022-10-10 DIAGNOSIS — F411 Generalized anxiety disorder: Secondary | ICD-10-CM | POA: Insufficient documentation

## 2022-10-10 DIAGNOSIS — E876 Hypokalemia: Secondary | ICD-10-CM | POA: Diagnosis not present

## 2022-10-10 DIAGNOSIS — D84821 Immunodeficiency due to drugs: Secondary | ICD-10-CM | POA: Diagnosis present

## 2022-10-10 DIAGNOSIS — F32A Depression, unspecified: Secondary | ICD-10-CM | POA: Diagnosis present

## 2022-10-10 DIAGNOSIS — K567 Ileus, unspecified: Secondary | ICD-10-CM | POA: Diagnosis not present

## 2022-10-10 DIAGNOSIS — J159 Unspecified bacterial pneumonia: Secondary | ICD-10-CM | POA: Diagnosis not present

## 2022-10-10 DIAGNOSIS — E0590 Thyrotoxicosis, unspecified without thyrotoxic crisis or storm: Secondary | ICD-10-CM | POA: Diagnosis present

## 2022-10-10 DIAGNOSIS — J90 Pleural effusion, not elsewhere classified: Secondary | ICD-10-CM | POA: Diagnosis present

## 2022-10-10 DIAGNOSIS — E872 Acidosis, unspecified: Secondary | ICD-10-CM

## 2022-10-10 LAB — GLUCOSE, CAPILLARY
Glucose-Capillary: 117 mg/dL — ABNORMAL HIGH (ref 70–99)
Glucose-Capillary: 145 mg/dL — ABNORMAL HIGH (ref 70–99)
Glucose-Capillary: 168 mg/dL — ABNORMAL HIGH (ref 70–99)
Glucose-Capillary: 161 mg/dL — ABNORMAL HIGH (ref 70–99)

## 2022-10-10 LAB — RESPIRATORY PANEL BY PCR

## 2022-10-10 LAB — CULTURE, RESPIRATORY W GRAM STAIN

## 2022-10-10 LAB — EXPECTORATED SPUTUM ASSESSMENT W GRAM STAIN, RFLX TO RESP C

## 2022-10-10 LAB — COMPREHENSIVE METABOLIC PANEL
ALT: 17 U/L (ref 0–44)
AST: 13 U/L — ABNORMAL LOW (ref 15–41)
Albumin: 2.6 g/dL — ABNORMAL LOW (ref 3.5–5.0)
Alkaline Phosphatase: 180 U/L — ABNORMAL HIGH (ref 38–126)
Anion gap: 20 — ABNORMAL HIGH (ref 5–15)
BUN: 67 mg/dL — ABNORMAL HIGH (ref 6–20)
CO2: 19 mmol/L — ABNORMAL LOW (ref 22–32)
Calcium: 7 mg/dL — ABNORMAL LOW (ref 8.9–10.3)
Chloride: 97 mmol/L — ABNORMAL LOW (ref 98–111)
Creatinine, Ser: 4.24 mg/dL — ABNORMAL HIGH (ref 0.44–1.00)
GFR, Estimated: 12 mL/min — ABNORMAL LOW (ref >60)
Glucose, Bld: 107 mg/dL — ABNORMAL HIGH (ref 70–99)
Potassium: 2.9 mmol/L — ABNORMAL LOW (ref 3.5–5.1)
Sodium: 136 mmol/L (ref 135–145)
Total Bilirubin: 0.6 mg/dL (ref 0.3–1.2)
Total Protein: 6.8 g/dL (ref 6.5–8.1)

## 2022-10-10 LAB — IRON AND TIBC
Iron: 14 ug/dL — ABNORMAL LOW (ref 28–170)
Saturation Ratios: 8 % — ABNORMAL LOW (ref 10.4–31.8)
TIBC: 186 ug/dL — ABNORMAL LOW (ref 250–450)
UIBC: 172 ug/dL

## 2022-10-10 LAB — BLOOD GAS, ARTERIAL
Acid-base deficit: 1.1 mmol/L (ref 0.0–2.0)
Bicarbonate: 22.4 mmol/L (ref 20.0–28.0)
Drawn by: 28340
O2 Saturation: 82.1 %
Patient temperature: 37
pCO2 arterial: 33 mmHg (ref 32–48)
pH, Arterial: 7.44 (ref 7.35–7.45)
pO2, Arterial: 48 mmHg — ABNORMAL LOW (ref 83–108)

## 2022-10-10 LAB — CBC
HCT: 29.3 % — ABNORMAL LOW (ref 36.0–46.0)
Hemoglobin: 9.6 g/dL — ABNORMAL LOW (ref 12.0–15.0)
MCH: 25.3 pg — ABNORMAL LOW (ref 26.0–34.0)
MCHC: 32.8 g/dL (ref 30.0–36.0)
MCV: 77.3 fL — ABNORMAL LOW (ref 80.0–100.0)
Platelets: 351 10*3/uL (ref 150–400)
RBC: 3.79 MIL/uL — ABNORMAL LOW (ref 3.87–5.11)
RDW: 13.8 % (ref 11.5–15.5)
WBC: 16.9 10*3/uL — ABNORMAL HIGH (ref 4.0–10.5)
nRBC: 0.2 % (ref 0.0–0.2)

## 2022-10-10 LAB — MAGNESIUM: Magnesium: 1.4 mg/dL — ABNORMAL LOW (ref 1.7–2.4)

## 2022-10-10 LAB — BASIC METABOLIC PANEL
Anion gap: 14 (ref 5–15)
BUN: 67 mg/dL — ABNORMAL HIGH (ref 6–20)
CO2: 21 mmol/L — ABNORMAL LOW (ref 22–32)
Calcium: 7 mg/dL — ABNORMAL LOW (ref 8.9–10.3)
Chloride: 99 mmol/L (ref 98–111)
Creatinine, Ser: 4.06 mg/dL — ABNORMAL HIGH (ref 0.44–1.00)
GFR, Estimated: 13 mL/min — ABNORMAL LOW (ref >60)
Glucose, Bld: 96 mg/dL (ref 70–99)
Potassium: 3.2 mmol/L — ABNORMAL LOW (ref 3.5–5.1)
Sodium: 134 mmol/L — ABNORMAL LOW (ref 135–145)

## 2022-10-10 LAB — MRSA NEXT GEN BY PCR, NASAL: MRSA by PCR Next Gen: NOT DETECTED

## 2022-10-10 LAB — TSH: TSH: 0.393 u[IU]/mL (ref 0.350–4.500)

## 2022-10-10 LAB — FOLATE: Folate: 5.6 ng/mL — ABNORMAL LOW (ref >5.9)

## 2022-10-10 LAB — LACTATE DEHYDROGENASE: LDH: 168 U/L (ref 98–192)

## 2022-10-10 LAB — VITAMIN B12: Vitamin B-12: 3573 pg/mL — ABNORMAL HIGH (ref 180–914)

## 2022-10-10 MED ORDER — DOXYCYCLINE HYCLATE 100 MG PO TABS
100.0000 mg | ORAL_TABLET | Freq: Two times a day (BID) | ORAL | Status: DC
  Administered 2022-10-10 – 2022-10-12 (×7): 100 mg via ORAL
  Filled 2022-10-10 (×7): qty 1

## 2022-10-10 MED ORDER — ALBUTEROL SULFATE (2.5 MG/3ML) 0.083% IN NEBU
3.0000 mL | INHALATION_SOLUTION | Freq: Four times a day (QID) | RESPIRATORY_TRACT | Status: DC | PRN
  Administered 2022-10-11 – 2022-11-10 (×15): 3 mL via RESPIRATORY_TRACT
  Filled 2022-10-10 (×5): qty 3
  Filled 2022-10-10: qty 9
  Filled 2022-10-10 (×6): qty 3
  Filled 2022-10-10: qty 9
  Filled 2022-10-10 (×4): qty 3

## 2022-10-10 MED ORDER — ACETAMINOPHEN 325 MG PO TABS
650.0000 mg | ORAL_TABLET | ORAL | Status: DC | PRN
  Administered 2022-10-10 (×2): 650 mg via ORAL
  Filled 2022-10-10 (×2): qty 2

## 2022-10-10 MED ORDER — BUPROPION HCL ER (XL) 300 MG PO TB24
450.0000 mg | ORAL_TABLET | Freq: Every day | ORAL | Status: DC
  Administered 2022-10-10 – 2022-10-12 (×3): 450 mg via ORAL
  Filled 2022-10-10 (×3): qty 3
  Filled 2022-10-10: qty 1

## 2022-10-10 MED ORDER — MYCOPHENOLATE SODIUM 180 MG PO TBEC
360.0000 mg | DELAYED_RELEASE_TABLET | Freq: Two times a day (BID) | ORAL | Status: DC
  Administered 2022-10-10 – 2022-10-12 (×7): 360 mg via ORAL
  Filled 2022-10-10 (×8): qty 2

## 2022-10-10 MED ORDER — CLONIDINE HCL 0.3 MG PO TABS: 0.3000 mg | ORAL_TABLET | Freq: Three times a day (TID) | ORAL | Status: DC

## 2022-10-10 MED ORDER — ATORVASTATIN CALCIUM 10 MG PO TABS
10.0000 mg | ORAL_TABLET | Freq: Every day | ORAL | Status: DC
  Administered 2022-10-10 – 2022-10-12 (×3): 10 mg via ORAL
  Filled 2022-10-10 (×3): qty 1

## 2022-10-10 MED ORDER — ARIPIPRAZOLE 10 MG PO TABS
30.0000 mg | ORAL_TABLET | Freq: Every day | ORAL | Status: DC
  Administered 2022-10-10 – 2022-10-12 (×3): 30 mg via ORAL
  Filled 2022-10-10 (×4): qty 3

## 2022-10-10 MED ORDER — PANTOPRAZOLE SODIUM 40 MG PO TBEC
40.0000 mg | DELAYED_RELEASE_TABLET | Freq: Every day | ORAL | Status: DC
  Administered 2022-10-10 – 2022-10-12 (×3): 40 mg via ORAL
  Filled 2022-10-10 (×3): qty 1

## 2022-10-10 MED ORDER — CALCIUM GLUCONATE-NACL 1-0.675 GM/50ML-% IV SOLN
1.0000 g | Freq: Once | INTRAVENOUS | Status: AC
  Administered 2022-10-10: 1000 mg via INTRAVENOUS
  Filled 2022-10-10: qty 50

## 2022-10-10 MED ORDER — POTASSIUM CHLORIDE CRYS ER 10 MEQ PO TBCR
40.0000 meq | EXTENDED_RELEASE_TABLET | Freq: Once | ORAL | Status: AC
  Administered 2022-10-10: 40 meq via ORAL
  Filled 2022-10-10: qty 4

## 2022-10-10 MED ORDER — BUPROPION HCL ER (XL) 150 MG PO TB24: 150.0000 mg | ORAL_TABLET | Freq: Every day | ORAL | Status: DC

## 2022-10-10 MED ORDER — SODIUM BICARBONATE 650 MG PO TABS
1300.0000 mg | ORAL_TABLET | Freq: Two times a day (BID) | ORAL | Status: DC
  Administered 2022-10-10 – 2022-10-12 (×7): 1300 mg via ORAL
  Filled 2022-10-10 (×7): qty 2

## 2022-10-10 MED ORDER — OXYCODONE HCL 5 MG PO TABS
5.0000 mg | ORAL_TABLET | Freq: Four times a day (QID) | ORAL | Status: DC | PRN
  Administered 2022-10-10 – 2022-10-13 (×4): 5 mg via ORAL
  Filled 2022-10-10 (×4): qty 1

## 2022-10-10 MED ORDER — TACROLIMUS 1 MG PO CAPS
5.0000 mg | ORAL_CAPSULE | Freq: Every day | ORAL | Status: DC
  Administered 2022-10-10 – 2022-10-12 (×3): 5 mg via ORAL
  Filled 2022-10-10 (×4): qty 5

## 2022-10-10 MED ORDER — GUAIFENESIN ER 600 MG PO TB12
1200.0000 mg | ORAL_TABLET | Freq: Two times a day (BID) | ORAL | Status: DC
  Administered 2022-10-10 – 2022-10-12 (×6): 1200 mg via ORAL
  Filled 2022-10-10 (×8): qty 2

## 2022-10-10 MED ORDER — LORATADINE 10 MG PO TABS
10.0000 mg | ORAL_TABLET | Freq: Every day | ORAL | Status: DC
  Administered 2022-10-10 – 2022-10-12 (×3): 10 mg via ORAL
  Filled 2022-10-10 (×3): qty 1

## 2022-10-10 MED ORDER — SODIUM CHLORIDE 0.9 % IV SOLN: 2.0000 g | INTRAVENOUS | Status: DC

## 2022-10-10 MED ORDER — DOXYCYCLINE HYCLATE 100 MG PO TABS: 100.0000 mg | ORAL_TABLET | Freq: Every day | ORAL | Status: DC

## 2022-10-10 MED ORDER — TAB-A-VITE/IRON PO TABS
1.0000 | ORAL_TABLET | Freq: Every day | ORAL | Status: DC
  Administered 2022-10-10 – 2022-10-12 (×3): 1 via ORAL
  Filled 2022-10-10 (×4): qty 1

## 2022-10-10 MED ORDER — CINACALCET HCL 30 MG PO TABS
30.0000 mg | ORAL_TABLET | Freq: Every day | ORAL | Status: DC
  Administered 2022-10-10 – 2022-10-12 (×3): 30 mg via ORAL
  Filled 2022-10-10 (×4): qty 1

## 2022-10-10 MED ORDER — SODIUM CHLORIDE 0.9 % IV SOLN
1.0000 g | INTRAVENOUS | Status: DC
  Administered 2022-10-10: 1 g via INTRAVENOUS
  Filled 2022-10-10: qty 10

## 2022-10-10 MED ORDER — HYDROXYZINE HCL 25 MG PO TABS
25.0000 mg | ORAL_TABLET | Freq: Every day | ORAL | Status: DC
  Administered 2022-10-10 – 2022-10-12 (×4): 25 mg via ORAL
  Filled 2022-10-10 (×4): qty 1

## 2022-10-10 MED ORDER — INSULIN PUMP
Freq: Three times a day (TID) | SUBCUTANEOUS | Status: DC
  Administered 2022-10-12: 12 via SUBCUTANEOUS
  Filled 2022-10-10: qty 1

## 2022-10-10 MED ORDER — HEPARIN SODIUM (PORCINE) 5000 UNIT/ML IJ SOLN
5000.0000 [IU] | Freq: Three times a day (TID) | INTRAMUSCULAR | Status: DC
  Administered 2022-10-10 – 2022-11-13 (×104): 5000 [IU] via SUBCUTANEOUS
  Filled 2022-10-10 (×102): qty 1

## 2022-10-10 MED ORDER — MONTELUKAST SODIUM 10 MG PO TABS
10.0000 mg | ORAL_TABLET | Freq: Every day | ORAL | Status: DC
  Administered 2022-10-10 – 2022-10-12 (×4): 10 mg via ORAL
  Filled 2022-10-10 (×4): qty 1

## 2022-10-10 MED ORDER — MAGNESIUM SULFATE 2 GM/50ML IV SOLN
2.0000 g | Freq: Once | INTRAVENOUS | Status: AC
  Administered 2022-10-10: 2 g via INTRAVENOUS
  Filled 2022-10-10: qty 50

## 2022-10-10 MED ORDER — BUPROPION HCL ER (XL) 150 MG PO TB24: 300.0000 mg | ORAL_TABLET | Freq: Every day | ORAL | Status: DC

## 2022-10-10 MED ORDER — FOLIC ACID 1 MG PO TABS
1.0000 mg | ORAL_TABLET | Freq: Every day | ORAL | Status: DC
  Administered 2022-10-10 – 2022-10-12 (×3): 1 mg via ORAL
  Filled 2022-10-10 (×3): qty 1

## 2022-10-10 MED ORDER — PREDNISONE 5 MG PO TABS: 5.0000 mg | ORAL_TABLET | Freq: Every day | ORAL | Status: DC

## 2022-10-10 MED ORDER — PREDNISONE 20 MG PO TABS
40.0000 mg | ORAL_TABLET | Freq: Every day | ORAL | Status: DC
  Administered 2022-10-10 – 2022-10-12 (×3): 40 mg via ORAL
  Filled 2022-10-10 (×3): qty 2

## 2022-10-10 MED ORDER — POTASSIUM CHLORIDE 2 MEQ/ML IV SOLN
INTRAVENOUS | Status: AC
  Filled 2022-10-10 (×3): qty 1000

## 2022-10-10 MED ORDER — TACROLIMUS 1 MG PO CAPS
4.0000 mg | ORAL_CAPSULE | Freq: Every day | ORAL | Status: DC
  Administered 2022-10-10 – 2022-10-12 (×3): 4 mg via ORAL
  Filled 2022-10-10 (×3): qty 4

## 2022-10-10 MED ORDER — INSULIN ASPART 100 UNIT/ML IJ SOLN: 0.0000 [IU] | Freq: Three times a day (TID) | INTRAMUSCULAR | Status: DC

## 2022-10-10 NOTE — Consult Note (Signed)
Reason for Consult: AKI Referring Physician: Janee Morn, MD  Linda Ellison is an 47 y.o. 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  She presented to [facility] with a 5 day history of right-sided chest pain radiating to her back, DOE, SOB, cough, and generalized body aches.  In the ED, temp 97.9, Bp 127/61, HR 81, SpO2 80%.  Labs were notable for Na 131, K 2.5, Cl 94, Co2 20, BUN 71, Cr 5.05, Ca 6.8, WBC 16.9, Hgb 9.9.  CXR with multifocal pneumonia.  She was admitted for IV antibiotics and we were consulted to further evaluate her AKI/CKD stage IIIa.  The trend in Scr is seen below.  She also reports nausea, poor po intake, and diarrhea for the past 5-6 days but no vomiting.  Her Scr had ranged 0.94-1.3 over the past 2 years until she was admitted on 08/26/22 with AKI due to diarrhea and norovirus infection.  Peak Scr of 3.46 but improved to 1.57 on 08/28/22.  No repeat labs drawn since her discharge.    Trend in Creatinine: Creatinine, Ser  Date/Time Value Ref Range Status  10/10/2022 06:36 AM 4.06 (H) 0.44 - 1.00 mg/dL Final  16/01/9603 54:09 AM 4.24 (H) 0.44 - 1.00 mg/dL Final  81/19/1478 29:56 PM 5.05 (H) 0.44 - 1.00 mg/dL Final  21/30/8657 84:69 AM 1.57 (H) 0.44 - 1.00 mg/dL Final  62/95/2841 32:44 PM 1.81 (H) 0.44 - 1.00 mg/dL Final  04/07/7251 66:44 AM 2.33 (H) 0.44 - 1.00 mg/dL Final  03/47/4259 56:38 PM 3.46 (H) 0.44 - 1.00 mg/dL Final  75/64/3329 51:88 AM 0.94 0.60 - 1.20 mg/dL Final  41/66/0630 16:01 PM 1.02 (H) 0.44 - 1.00 mg/dL Final  09/32/3557 32:20 PM 0.91 0.44 - 1.00 mg/dL Final  25/42/7062 37:62 PM 1.29 (H) 0.44 - 1.00 mg/dL Final  83/15/1761 60:73 PM 1.19  (H) 0.44 - 1.00 mg/dL Final  71/09/2692 85:46 AM 1.30 (H) 0.44 - 1.00 mg/dL Final  27/06/5007 38:18 AM 9.10 (H) 0.44 - 1.00 mg/dL Final  29/93/7169 67:89 PM 6.34 (H) 0.44 - 1.00 mg/dL Final  38/01/1750 02:58 AM 9.67 (H) 0.44 - 1.00 mg/dL Final  52/77/8242 35:36 AM 7.43 (H) 0.44 - 1.00 mg/dL Final  14/43/1540 08:67 PM 14.11 (H) 0.44 - 1.00 mg/dL Final  61/95/0932 67:12 AM 13.79 (H) 0.44 - 1.00 mg/dL Final  45/80/9983 38:25 PM 13.31 (H) 0.44 - 1.00 mg/dL Final  05/39/7673 41:93 AM 13.17 (H) 0.44 - 1.00 mg/dL Final  79/05/4095 35:32 PM 15.28 (H) 0.44 - 1.00 mg/dL Final  99/24/2683 41:96 AM 8.25 (H) 0.44 - 1.00 mg/dL Final  22/29/7989 21:19 AM 11.89 (H) 0.44 - 1.00 mg/dL Final  41/74/0814 48:18 PM 5.51 (H) 0.44 - 1.00 mg/dL Final  56/31/4970 26:37 AM 6.95 (H) 0.44 - 1.00 mg/dL Final  85/88/5027 74:12 PM 4.77 (H) 0.44 - 1.00 mg/dL Final  87/86/7672 09:47 AM 8.26 (H) 0.44 - 1.00 mg/dL Final  09/62/8366 29:47 AM 4.85 (H) 0.44 - 1.00 mg/dL Final  65/46/5035 46:56 PM 7.20 (H) 0.50 - 1.10 mg/dL Final  81/27/5170 01:74 PM 8.51 (H) 0.50 - 1.10 mg/dL Final  94/49/6759 16:38 PM 6.25 (H) 0.50 - 1.10 mg/dL Final  46/65/9935 70:17 PM 4.23 (H) 0.50 - 1.10 mg/dL Final  10/29/2013 04:12 PM 7.57 (H) 0.50 - 1.10 mg/dL Final  16/01/9603 54:09 AM 7.05 (H) 0.50 - 1.10 mg/dL Final  81/19/1478 29:56 PM 6.84 (H) 0.50 - 1.10 mg/dL Final  21/30/8657 84:69 PM 5.53 (H) 0.50 - 1.10 mg/dL Final  62/95/2841 32:44 PM 4.66 (H) 0.50 - 1.10 mg/dL Final  04/07/7251 66:44 AM 3.80 (H) 0.50 - 1.10 mg/dL Final  03/47/4259 56:38 AM 3.60 (H) 0.50 - 1.10 mg/dL Final  75/64/3329 51:88 PM 3.85 (H) 0.50 - 1.10 mg/dL Final  41/66/0630 16:01 PM 3.00 (H) 0.4 - 1.2 mg/dL Final  09/32/3557 32:20 AM 2.35 (H) 0.4 - 1.2 mg/dL Final  25/42/7062 37:62 AM 2.32 (H) 0.4 - 1.2 mg/dL Final  83/15/1761 60:73 AM 2.26 (H) 0.4 - 1.2 mg/dL Final  71/09/2692 85:46 AM 2.17 (H) 0.4 - 1.2 mg/dL Final  27/06/5007 38:18 AM 2.26 (H) 0.4 - 1.2 mg/dL Final   29/93/7169 67:89 AM 2.4 (H) 0.4 - 1.2 mg/dL Final  38/01/1750 02:58 AM 2.42 (H) 0.4 - 1.2 mg/dL Final  52/77/8242 35:36 AM 2.42 mg/dL   14/43/1540 08:67 PM 6.19  (H) 0.4 - 1.2 mg/dL Final  50/93/2671 24:58 AM 1.09  0.4 - 1.2 mg/dL Final  09/98/3382 50:53 PM 1.8 (H) 0.4 - 1.2 mg/dL Final    PMH:   Past Medical History:  Diagnosis Date   Allergy    Anemia associated with chronic renal failure    Anxiety    Asthma    Atypical chest pain    long-standing -- normal cardio cath 06-27-2012 and normal nuclear stress test 06-25-2016   AV (arteriovenous fistula) (HCC)    for dialysis-- currently located left radiocephalic   CAD (coronary artery disease) cardiologist-- dr Sharol Roussel [facility] cardiology in [place])   a. False positive stress echo 06[facility] - cath with no obstructive CAD at [facility] (mild luminal irregularities in LAD, moderate diffuse disease in distal RCA).   Depression    Elevated lipids    ESRD on hemodialysis [facility]) NEPHROLOGIST-  DR MATTINGLY   started dialysis 01/29/11: [facility] on MWF   Gastroparesis    GERD (gastroesophageal reflux disease)    History of pneumonia    HCAP 04/ 2017   Hyperlipidemia    Hyperthyroidism    Menorrhagia    Other secondary hypertension    associated to diabetes -- followed by cardiologist ( dr Sharol Roussel)     Peripheral neuropathy    PONV (postoperative nausea and vomiting)    Pre-transplant evaluation for kidney transplant    [facility]   Renal failure syndrome 03/05/2022   Secondary hyperparathyroidism of renal origin [facility])    Sleep apnea    Type 2 diabetes mellitus, with long-term current use of insulin (HCC)    dx 1985    PSH:   Past Surgical History:  Procedure Laterality Date   AV FISTULA PLACEMENT  08/2010   Left radiocephalic AVF   AV FISTULA PLACEMENT Right 05/07/2014   Procedure: ARTERIOVENOUS (AV) FISTULA CREATION;  Surgeon: Sherren Kerns, MD;   Location: [facility] OR;  Service: Vascular;  Laterality: Right;   AV FISTULA PLACEMENT Right 07/02/2014   Procedure: INSERTION OF ARTERIOVENOUS (AV) GORE-TEX GRAFT ARM;  Surgeon: Sherren Kerns, MD;  Location: [facility] OR;  Service: Vascular;  Laterality: Right;   CARDIOVASCULAR STRESS TEST  06/25/2016   [facility]   normal nuclear perfusion study w/ no ischemia/  normal LV function and wall motion , ef 51%  CESAREAN SECTION  03/22/2001   w/ Bilateral Tubal Ligation   COLONOSCOPY     COLONOSCOPY WITH ESOPHAGOGASTRODUODENOSCOPY (EGD)  10/19/2011   DILATION AND CURETTAGE OF UTERUS  05/12/2000   w/ suction for missed ab   DOBUTAMINE STRESS ECHO  06/04/2016    [facility]   normal stress echo w/ no chest pain or ischemia/  normal LV function and wall motion , stress ef 60-65%   ESOPHAGOGASTRODUODENOSCOPY  10/19/2011   Dr. Stan Head   FEMUR IM NAIL Left child   removed   FOOT SURGERY Left 2014 approx.   HYSTEROSCOPY WITH NOVASURE N/A 08/19/2016   Procedure: HYSTEROSCOPY WITH NOVASURE;  Surgeon: Huel Cote, MD;  Location: [facility];  Service: Gynecology;  Laterality: N/A;   KIDNEY TRANSPLANT  09/18/2016   LAPAROSCOPIC CHOLECYSTECTOMY  1994   LEFT HEART CATHETERIZATION WITH CORONARY ANGIOGRAM N/A 06/27/2012   Procedure: LEFT HEART CATHETERIZATION WITH CORONARY ANGIOGRAM;  Surgeon: Laurey Morale, MD;  Location: [facility] CATH LAB;  Service: Cardiovascular;  Laterality: N/A; No ostructive CAD, normal LVF, ef 55-60% (mild LAD luminal irregularities, moderate dRCA diffuse disease)   LIGATION GORETEX FISTULA  01/04/11   Left AVF   LIGATION OF ARTERIOVENOUS  FISTULA Left 08/21/2014   Procedure: LIGATION OF ARTERIOVENOUS  FISTULA;  Surgeon: Annice Needy, MD;  Location: [facility] ORS;  Service: Vascular;  Laterality: Left;   LIGATION OF ARTERIOVENOUS  FISTULA Left 06/29/2017   Procedure: LIGATION OF ARTERIOVENOUS  FISTULA ( RADIOCEPHALIC );  Surgeon: Annice Needy, MD;  Location: [facility] ORS;  Service: Vascular;   Laterality: Left;   NEPHRECTOMY TRANSPLANTED ORGAN     PERIPHERAL VASCULAR CATHETERIZATION Left 08/08/2014   Procedure: Upper Extremity Angiography;  Surgeon: Annice Needy, MD;  Location: [facility] INVASIVE CV LAB;  Service: Cardiovascular;  Laterality: Left;   PERIPHERAL VASCULAR CATHETERIZATION Left 08/08/2014   Procedure: Upper Extremity Intervention;  Surgeon: Annice Needy, MD;  Location: [facility] INVASIVE CV LAB;  Service: Cardiovascular;  Laterality: Left;   PERIPHERAL VASCULAR CATHETERIZATION Left 03/08/2016   Procedure: A/V Fistulagram;  Surgeon: Annice Needy, MD;  Location: [facility] INVASIVE CV LAB;  Service: Cardiovascular;  Laterality: Left;   POLYPECTOMY     THROMBECTOMY AND REVISION OF ARTERIOVENTOUS (AV) GORETEX  GRAFT Right 07/16/2014   Procedure: THROMBECTOMY  Right  arm  ARTERIOVENOUS  GORETEX  GRAFT;  Surgeon: Sherren Kerns, MD;  Location[facility] OR;  Service: Vascular;  Laterality: Right;   TRANSTHORACIC ECHOCARDIOGRAM  06/04/2016   mild concentric LVH, ef 55-60%,  mild TR,  borderline LAE,  trivial MR and PR    Allergies:  Allergies  Allergen Reactions   Ciprofloxacin Hives, Itching and Nausea And Vomiting   Fluocinolone Itching, Nausea And Vomiting and Other (See Comments)   Hydromorphone Hives   Strawberry Extract Itching and Other (See Comments)    Pt states that this medication makes her tongue raw.   No reaction   Hydromorphone Hcl Hives   Phenytoin Sodium Extended Itching   Adhesive [Tape] Itching and Rash   Chlorhexidine Gluconate Itching   Clindamycin Diarrhea, Nausea And Vomiting and Rash   Oxycodone Nausea And Vomiting, Rash and Other (See Comments)    Reaction:  Hallucinations    Shrimp [Shellfish Allergy] Rash    Medications:   Prior to Admission medications   Medication Sig Start Date End Date Taking? Authorizing Provider  albuterol (PROVENTIL HFA;VENTOLIN HFA) 108 (90 Base) MCG/ACT inhaler Inhale 2 puffs into the lungs every 6 (six) hours as  needed for wheezing or  shortness of breath. 11/25/17  Yes Nwoko, Nicole Kindred I, NP  amLODipine (NORVASC) 10 MG tablet Take 1 tablet by mouth daily. 02/11/22  Yes [provider]  ARIPiprazole (ABILIFY) 30 MG tablet Take 30 mg by mouth daily. 01/26/22  Yes [provider]  atorvastatin (LIPITOR) 10 MG tablet Take 10 mg by mouth daily. 02/08/22  Yes [provider]  buPROPion (WELLBUTRIN XL) 150 MG 24 hr tablet Take 1 tablet (150 mg total) by mouth daily after breakfast. For depression Patient taking differently: Take 150 mg by mouth daily after breakfast. Take with 300mg  for a total daily dose of 450mg  for depression 11/26/17  Yes Nwoko, Agnes I, NP  buPROPion (WELLBUTRIN XL) 300 MG 24 hr tablet Take 300 mg by mouth daily.   Yes [provider]  cetirizine (ZYRTEC) 10 MG tablet Take 10 mg by mouth 2 (two) times daily.   Yes [provider]  cinacalcet (SENSIPAR) 30 MG tablet Take 30 mg by mouth daily. 03/10/22  Yes [provider]  citalopram (CELEXA) 40 MG tablet Take 40 mg by mouth daily.   Yes [provider]  cloNIDine (CATAPRES) 0.3 MG tablet Take 0.3 mg by mouth 3 (three) times daily.    Yes [provider]  Dulaglutide 4.5 MG/0.5ML SOPN Inject 4.5 mg into the skin once a week. Every Tuesday 12/09/21  Yes [provider]  gabapentin (NEURONTIN) 300 MG capsule Take 300 mg by mouth at bedtime as needed. pain   Yes [provider]  hydrOXYzine (ATARAX) 25 MG tablet Take 25 mg by mouth at bedtime.   Yes [provider]  insulin aspart (NOVOLOG) 100 UNIT/ML injection USE UP TO 100 UNITS ONCE DAILY IN INSULIN PUMP. DX CODE E11.42 03/10/22  Yes [provider]  Insulin Human (INSULIN PUMP) SOLN Inject 1 each into the skin See admin instructions.   Yes [provider]  labetalol (NORMODYNE) 200 MG tablet Take 2 tablets (400 mg total) by mouth 3 (three) times daily. For high blood pressure 11/25/17  Yes Nwoko, Agnes I, NP   montelukast (SINGULAIR) 10 MG tablet Take 10 mg by mouth at bedtime.   Yes [provider]  mycophenolate (MYFORTIC) 180 MG EC tablet Take 720 mg by mouth 2 (two) times daily.   Yes [provider]  Oxycodone HCl 10 MG TABS Take 10 mg by mouth See admin instructions. Take 1 tablet by mouth by mouth every 4 to 6 hours as needed for sciatic nerve and back pain   Yes [provider]  pantoprazole (PROTONIX) 40 MG tablet Take 1 tablet (40 mg total) by mouth daily. For acid reflux 11/26/17  Yes Nwoko, Nicole Kindred I, NP  prazosin (MINIPRESS) 1 MG capsule Take 1 mg by mouth at bedtime.   Yes [provider]  predniSONE (DELTASONE) 5 MG tablet Take 5 mg by mouth daily with breakfast.   Yes [provider]  simvastatin (ZOCOR) 10 MG tablet Take 1 tablet (10 mg total) by mouth at bedtime. For high cholesterol Patient taking differently: Take 10 mg by mouth daily. For high cholesterol 11/25/17  Yes Nwoko, Nicole Kindred I, NP  sodium bicarbonate 650 MG tablet Take 1,300 mg by mouth 2 (two) times daily.   Yes [provider]  sulfamethoxazole-trimethoprim (BACTRIM,SEPTRA) 400-80 MG tablet Take 1 tablet by mouth 3 (three) times a week. For infection Patient taking differently: Take 1 tablet by mouth every Monday, Wednesday, and Friday. For infection 11/28/17  Yes Armandina Stammer I, NP  tacrolimus (PROGRAF) 1 MG capsule Take 4-5 mg by mouth See admin instructions. Take 5 capsules by mouth in the morning and 4 capsules at night   Yes [provider]  zolpidem (AMBIEN) 5 MG tablet Take 1 tablet (5 mg total) by mouth at bedtime as needed for sleep. Patient taking differently: Take 5 mg by mouth at bedtime. 11/25/17  Yes Sanjuana Kava, NP  naloxone [facility]) nasal spray 4 mg/0.1 mL  10/28/21   [provider]    Inpatient medications:  atorvastatin  10 mg Oral Daily   cinacalcet  30 mg Oral Q breakfast   doxycycline  100 mg Oral Q12H   folic acid  1 mg Oral Daily    guaiFENesin  1,200 mg Oral BID   heparin  5,000 Units Subcutaneous Q8H   hydrOXYzine  25 mg Oral QHS   loratadine  10 mg Oral Daily   montelukast  10 mg Oral QHS   multivitamins with iron  1 tablet Oral Daily   mycophenolate  360 mg Oral BID   pantoprazole  40 mg Oral Daily   predniSONE  40 mg Oral Q breakfast   sodium bicarbonate  1,300 mg Oral BID   tacrolimus  4 mg Oral QHS   tacrolimus  5 mg Oral Daily    Discontinued Meds:   Medications Discontinued During This Encounter  Medication Reason   ceFEPIme (MAXIPIME) 2 g in sodium chloride 0.9 % 100 mL IVPB    ceFEPIme (MAXIPIME) 1 g in sodium chloride 0.9 % 100 mL IVPB    insulin aspart (novoLOG) injection 0-6 Units    ceFEPIme (MAXIPIME) 2 g in sodium chloride 0.9 % 100 mL IVPB Entry Error   doxycycline (VIBRA-TABS) tablet 100 mg    cloNIDine (CATAPRES) tablet 0.3 mg    predniSONE (DELTASONE) tablet 5 mg     Social History:  reports that she has never smoked. She has never used smokeless tobacco. She reports that she does not drink alcohol and does not use drugs.  Family History:   Family History  Problem Relation Age of Onset   Heart disease Mother        heart attack at 50 y/o-infected valve   Kidney disease Mother        was a dialysis patient   Diabetes Father    Hypertension Father    Heart disease Brother    Arthritis Brother    Diabetes Maternal Aunt    Hypertension Maternal Aunt    Kidney failure Paternal Aunt    Diabetes Paternal Aunt    Heart disease Paternal Aunt    Hypertension Paternal Aunt    Kidney failure Paternal Uncle    Kidney disease Maternal Grandmother        pre-dialysis   Colon cancer Neg Hx    Colon polyps Neg Hx    Crohn's disease Neg Hx    Esophageal cancer Neg Hx    Rectal cancer Neg Hx    Stomach cancer Neg Hx    Ulcerative colitis Neg Hx     Pertinent items are noted in HPI. Weight change:   Intake/Output Summary (Last 24 hours) at 10/10/2022 1141 Last data filed at 10/10/2022  0900 Gross per 24 hour  Intake 1600 ml  Output 0 ml  Net 1600 ml   BP 122/61 (BP Location: Right Arm)   Pulse 90   Temp 98.4 F (36.9 C) (Oral)   Resp 20  Ht 5' 6.5" (1.689 m)   Wt 119 kg   SpO2 94%   BMI 41.71 kg/m  Vitals:   10/09/22 2300 10/10/22 0449 10/10/22 0606 10/10/22 0834  BP: (!) 125/55 (!) 120/58  122/61  Pulse: 84 89  90  Resp: 18 20  20   Temp: 98 F (36.7 C) 98.2 F (36.8 C)  98.4 F (36.9 C)  TempSrc: Oral Oral  Oral  SpO2: 93% 93%  94%  Weight:   119 kg   Height:         General appearance: fatigued and no distress Head: Normocephalic, without obvious abnormality, atraumatic Resp: rales bilaterally Cardio: regular rate and rhythm, S1, S2 normal, no murmur, click, rub or gallop GI: soft, non-tender; bowel sounds normal; no masses,  no organomegaly and renal allograft in RLQ, no tenderness or bruits. Extremities: edema trace pretibial edema bilaterally  Labs: Basic Metabolic Panel: Recent Labs  Lab 10/09/22 2009 10/10/22 0216 10/10/22 0636  NA 131* 136 134*  K 2.5* 2.9* 3.2*  CL 94* 97* 99  CO2 20* 19* 21*  GLUCOSE 138* 107* 96  BUN 71* 67* 67*  CREATININE 5.05* 4.24* 4.06*  ALBUMIN  --  2.6*  --   CALCIUM 6.8* 7.0* 7.0*   Liver Function Tests: Recent Labs  Lab 10/10/22 0216  AST 13*  ALT 17  ALKPHOS 180*  BILITOT 0.6  PROT 6.8  ALBUMIN 2.6*   No results for input(s): "LIPASE", "AMYLASE" in the last 168 hours. No results for input(s): "AMMONIA" in the last 168 hours. CBC: Recent Labs  Lab 10/09/22 2009 10/10/22 0216  WBC 16.9* 16.9*  NEUTROABS 13.5*  --   HGB 9.9* 9.6*  HCT 29.8* 29.3*  MCV 77.0* 77.3*  PLT 365 351   PT/INR: @LABRCNTIP (inr:5) Cardiac Enzymes: )No results for input(s): "CKTOTAL", "CKMB", "CKMBINDEX", "TROPONINI" in the last 168 hours. CBG: Recent Labs  Lab 10/10/22 0723  GLUCAP 117*    Iron Studies:  Recent Labs  Lab 10/10/22 0216  IRON 14*  TIBC 186*    Xrays/Other Studies: US Renal  Transplant w/Doppler  Result Date: 10/10/2022 CLINICAL DATA:  Acute kidney injury.  Status post renal transplant. EXAM: ULTRASOUND OF RENAL TRANSPLANT WITH RENAL DOPPLER ULTRASOUND TECHNIQUE: Ultrasound examination of the renal transplant was performed with gray-scale, color and duplex doppler evaluation. COMPARISON:  Renal transplant ultrasound 08/28/2022 and 11/06/2021. FINDINGS: Transplant kidney location: Right lower quadrant Transplant Kidney: Renal measurements: 11.6 x 7.5 x 7.3 cm = volume: . Normal in size and parenchymal echogenicity. No evidence of mass or hydronephrosis. No peri-transplant fluid collection seen. Color flow in the main renal artery:  Yes Color flow in the main renal vein:  Yes Duplex Doppler Evaluation: Main Renal Artery Velocity: 159 cm/sec Main Renal Artery Resistive Index: 0.8 Venous waveform in main renal vein:  Present Intrarenal resistive index in upper pole:  0.7 (normal 0.6-0.8; equivocal 0.8-0.9; abnormal >= 0.9) Intrarenal resistive index in lower pole: 0.6 (normal 0.6-0.8; equivocal 0.8-0.9; abnormal >= 0.9) Bladder: Normal for degree of bladder distention. Other findings:  None. IMPRESSION: Normal sonographic appearance of the transplant kidney. Electronically Signed   By: Darliss Cheney M.D.   On: 10/10/2022 02:27   DG Chest 2 View  Result Date: 10/09/2022 CLINICAL DATA:  Chest pain and shortness of breath EXAM: CHEST - 2 VIEW COMPARISON:  Chest x-ray 08/27/2018 FINDINGS: There is airspace disease in the inferior right upper lobe and right lower lobe. There is a small right pleural effusion. Cardiomediastinal silhouette is  within normal limits. No acute fractures are seen. IMPRESSION: 1. Airspace disease in the inferior right upper lobe and right lower lobe, concerning for multifocal pneumonia. Follow-up imaging recommended in 4-6 weeks to confirm resolution. 2. Small right pleural effusion. Electronically Signed   By: Darliss Cheney M.D.   On: 10/09/2022 19:14      Assessment/Plan:  AKI/CKD stage IIIa following DDKT- presumably due to ischemic ATN in setting of poor po intake and diarrhea for the past 5-6 days.  Scr has improved with IVF's, however UOP not measured.  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  No indication for dialysis at this time but will continue to follow closely.    Avoid nephrotoxic medications including NSAIDs and iodinated intravenous contrast exposure unless the latter is absolutely indicated.  Preferred narcotic agents for pain control are hydromorphone, fentanyl, and methadone. Morphine should not be used. Avoid Baclofen and avoid oral sodium phosphate and magnesium citrate based laxatives / bowel preps. Continue strict Input and Output monitoring. Will monitor the patient closely with you and intervene or adjust therapy as indicated by changes in clinical status/labs  Multifocal pneumonia - antibiotics per primary svc.  Dose for eGFR <20 Immunosuppressive therapy - normally on myfortic 720 mg bid, prograf 5 mg qam and 4 mg qpm, and prednisone 5 mg.  Agree with decreasing myfortic to 360 mg bid.  Will order tacrolimus trough level and may need to decrease this as well. AGMA - due to #1 and diarrhea.  Currently imprving with LR infusion.  Continue to follow. Hypokalemia - replete and follow closely given AKI Hypomagnesemia - given 2 grams IV x 1.  Continue to follow levels.  Acute hypoxic respiratory failure with acute asthma exacerbation - supplemental oxygen as needed.  Prednisone increased to 40 mg daily per primary svc. CAD - stable DM type 2 - per primary svc Anemia of CKD stage III - continue to follow and transfuse for Hgb <7   Julien Nordmann Dewey Viens 10/10/2022, 11:41 AM

## 2022-10-10 NOTE — H&P (Addendum)
History and Physical    Linda Ellison DVV:[id number] DOB: 1976/02/27 DOA: 10/09/2022  DOS: the patient was seen and examined on 10/10/2022  PCP: Daylene Katayama, PA   Patient coming from: Home   Chief Complaint:  Chief Complaint  Patient presents with   Chest Pain    HPI:  Linda Ellison is a 47 y.o. female with ESRD due to diabetic nephropathy s/p deceased donor renal transplant 10/2016 complicated by renal artery stenosis in 2019 s/p angioplasty and stent of right renal artery, insulin-dependent DM type II, hypertension, hyperlipidemia, obesity, OSA, CAD, hypothyroidism, peripheral neuropathy, gastroparesis, chronic anemia, anxiety, and depression presented to emergency department at [facility] with complaining of generalized weakness, fatigue, cough and shortness of breath.  Patient denies any fever, chill, diarrhea, headache, blurry vision, chest pain, and palpitation.  Per chart review she was recently admitted 08/26/2022 to 08/28/2022 in the setting of norovirus associated diarrhea, hypokalemia and hypomagnesemia.  Patient reported compliant with immunosuppressant at home. She is complaining me that for last 5 to 6 months she has mold inside her house.  She is has been developing progressive cough over the course of last few days with associated shortness of breath now.  No known sick contact and no recent travel.  ED Course:  Initial presentation to ED patient's vital found stable temperature 97, heart rate 81, respiratory 20, O2 sat 95% on room air however patient desatted to 80% and with  2 L improved to 93%. Lab work, BMP showed slightly low sodium 131, low potassium 2.5, low chloride 95, low bicarb 20, blood glucose 138, BUN 71, elevated creatinine 5.05 (baseline creatinine around 1.5), low calcium 6.8 and gap and an 17.  Low mag 1.5. Respiratory panel negative. CBC showed leukocytosis WBC 16.9, low H&H 9.9 and 29.8, platelet 365 WNL. High-sensitivity troponin 16  within normal range. In the ED patient got cefepime 2 g once, KCl total 60 meq, 1 dose of doxycycline 100 mg IV once, mag oxide 400 mg once, 1 L of LR bolus.  Chest x-ray showed airspace disease in the inferior right upper lobe, right lower lobe, concerning for multifocal pneumonia.  And small right pleural effusion.  Patient has been transferred to [facility] for nephrology evaluation and renal transplant ultrasound.  Review of Systems:  Review of Systems  Constitutional:  Negative for chills, fever, malaise/fatigue and weight loss.  HENT:  Negative for ear discharge and ear pain.   Respiratory:  Positive for cough and shortness of breath. Negative for hemoptysis, sputum production and wheezing.   Cardiovascular:  Negative for chest pain, palpitations, orthopnea and leg swelling.  Gastrointestinal:  Negative for abdominal pain, diarrhea, heartburn, nausea and vomiting.  Genitourinary:  Negative for dysuria, frequency and urgency.  Musculoskeletal:  Negative for back pain, joint pain, myalgias and neck pain.  Skin:  Negative for rash.  Neurological:  Negative for dizziness and headaches.  Endo/Heme/Allergies:  Negative for environmental allergies.    Past Medical History:  Diagnosis Date   Allergy    Anemia associated with chronic renal failure    Anxiety    Asthma    Atypical chest pain    long-standing -- normal cardio cath 06-27-2012 and normal nuclear stress test 06-25-2016   AV (arteriovenous fistula) (HCC)    for dialysis-- currently located left radiocephalic   CAD (coronary artery disease) cardiologist-- dr Sharol Roussel [facility] cardiology in [place])   a. False positive stress echo 06/2012 at [facility] - cath with  no obstructive CAD at [facility] (mild luminal irregularities in LAD, moderate diffuse disease in distal RCA).   Depression    Elevated lipids    ESRD on hemodialysis [facility]) NEPHROLOGIST-  DR MATTINGLY   started dialysis 01/29/11: Foye Spurling [facility] on MWF   Gastroparesis    GERD (gastroesophageal reflux disease)    History of pneumonia    HCAP 04/ 2017   Hyperlipidemia    Hyperthyroidism    Menorrhagia    Other secondary hypertension    associated to diabetes -- followed by cardiologist ( dr Sharol Roussel)     Peripheral neuropathy    PONV (postoperative nausea and vomiting)    Pre-transplant evaluation for kidney transplant    [facility]   Renal failure syndrome 03/05/2022   Secondary hyperparathyroidism of renal origin [facility])    Sleep apnea    Type 2 diabetes mellitus, with long-term current use of insulin (HCC)    dx 1985    Past Surgical History:  Procedure Laterality Date   AV FISTULA PLACEMENT  08/2010   Left radiocephalic AVF   AV FISTULA PLACEMENT Right 05/07/2014   Procedure: ARTERIOVENOUS (AV) FISTULA CREATION;  Surgeon: Sherren Kerns, MD;  Location: [facility] OR;  Service: Vascular;  Laterality: Right;   AV FISTULA PLACEMENT Right 07/02/2014   Procedure: INSERTION OF ARTERIOVENOUS (AV) GORE-TEX GRAFT ARM;  Surgeon: Sherren Kerns, MD;  Location: [facility] OR;  Service: Vascular;  Laterality: Right;   CARDIOVASCULAR STRESS TEST  06/25/2016   [facility]   normal nuclear perfusion study w/ no ischemia/  normal LV function and wall motion , ef 51%   CESAREAN SECTION  03/22/2001   w/ Bilateral Tubal Ligation   COLONOSCOPY     COLONOSCOPY WITH ESOPHAGOGASTRODUODENOSCOPY (EGD)  10/19/2011   DILATION AND CURETTAGE OF UTERUS  05/12/2000   w/ suction for missed ab   DOBUTAMINE STRESS ECHO  06/04/2016    [facility]   normal stress echo w/ no chest pain or ischemia/  normal LV function and wall motion , stress ef 60-65%   ESOPHAGOGASTRODUODENOSCOPY  10/19/2011   Dr. Stan Head   FEMUR IM NAIL Left child   removed   FOOT SURGERY Left 2014 approx.   HYSTEROSCOPY WITH NOVASURE N/A 08/19/2016   Procedure: HYSTEROSCOPY WITH NOVASURE;  Surgeon: Huel Cote, MD;  Location: [facility];   Service: Gynecology;  Laterality: N/A;   KIDNEY TRANSPLANT  09/18/2016   LAPAROSCOPIC CHOLECYSTECTOMY  1994   LEFT HEART CATHETERIZATION WITH CORONARY ANGIOGRAM N/A 06/27/2012   Procedure: LEFT HEART CATHETERIZATION WITH CORONARY ANGIOGRAM;  Surgeon: Laurey Morale, MD;  Location: [facility] CATH LAB;  Service: Cardiovascular;  Laterality: N/A; No ostructive CAD, normal LVF, ef 55-60% (mild LAD luminal irregularities, moderate dRCA diffuse disease)   LIGATION GORETEX FISTULA  01/04/11   Left AVF   LIGATION OF ARTERIOVENOUS  FISTULA Left 08/21/2014   Procedure: LIGATION OF ARTERIOVENOUS  FISTULA;  Surgeon: Annice Needy, MD;  Location: [facility] ORS;  Service: Vascular;  Laterality: Left;   LIGATION OF ARTERIOVENOUS  FISTULA Left 06/29/2017   Procedure: LIGATION OF ARTERIOVENOUS  FISTULA ( RADIOCEPHALIC );  Surgeon: Annice Needy, MD;  Location: [facility] ORS;  Service: Vascular;  Laterality: Left;   NEPHRECTOMY TRANSPLANTED ORGAN     PERIPHERAL VASCULAR CATHETERIZATION Left 08/08/2014   Procedure: Upper Extremity Angiography;  Surgeon: Annice Needy, MD;  Location: [facility] INVASIVE CV LAB;  Service: Cardiovascular;  Laterality: Left;   PERIPHERAL VASCULAR CATHETERIZATION  Left 08/08/2014   Procedure: Upper Extremity Intervention;  Surgeon: Annice Needy, MD;  Location[facility] INVASIVE CV LAB;  Service: Cardiovascular;  Laterality: Left;   PERIPHERAL VASCULAR CATHETERIZATION Left 03/08/2016   Procedure: A/V Fistulagram;  Surgeon: Annice Needy, MD;  Location: [facility] INVASIVE CV LAB;  Service: Cardiovascular;  Laterality: Left;   POLYPECTOMY     THROMBECTOMY AND REVISION OF ARTERIOVENTOUS (AV) GORETEX  GRAFT Right 07/16/2014   Procedure: THROMBECTOMY  Right  arm  ARTERIOVENOUS  GORETEX  GRAFT;  Surgeon: Sherren Kerns, MD;  Location: [facility] OR;  Service: Vascular;  Laterality: Right;   TRANSTHORACIC ECHOCARDIOGRAM  06/04/2016   mild concentric LVH, ef 55-60%,  mild TR,  borderline LAE,  trivial MR and PR     reports that she has never  smoked. She has never used smokeless tobacco. She reports that she does not drink alcohol and does not use drugs.  Allergies  Allergen Reactions   Ciprofloxacin Hives, Itching and Nausea And Vomiting   Fluocinolone Itching, Nausea And Vomiting and Other (See Comments)   Hydromorphone Hives   Strawberry Extract Itching and Other (See Comments)    Pt states that this medication makes her tongue raw.   No reaction   Hydromorphone Hcl Hives   Phenytoin Sodium Extended Itching   Adhesive [Tape] Itching and Rash   Chlorhexidine Gluconate Itching   Clindamycin Diarrhea, Nausea And Vomiting and Rash   Oxycodone Nausea And Vomiting, Rash and Other (See Comments)    Reaction:  Hallucinations    Shrimp [Shellfish Allergy] Rash    Family History  Problem Relation Age of Onset   Heart disease Mother        heart attack at 61 y/o-infected valve   Kidney disease Mother        was a dialysis patient   Diabetes Father    Hypertension Father    Heart disease Brother    Arthritis Brother    Diabetes Maternal Aunt    Hypertension Maternal Aunt    Kidney failure Paternal Aunt    Diabetes Paternal Aunt    Heart disease Paternal Aunt    Hypertension Paternal Aunt    Kidney failure Paternal Uncle    Kidney disease Maternal Grandmother        pre-dialysis   Colon cancer Neg Hx    Colon polyps Neg Hx    Crohn's disease Neg Hx    Esophageal cancer Neg Hx    Rectal cancer Neg Hx    Stomach cancer Neg Hx    Ulcerative colitis Neg Hx     Prior to Admission medications   Medication Sig Start Date End Date Taking? Authorizing Provider  albuterol (PROVENTIL HFA;VENTOLIN HFA) 108 (90 Base) MCG/ACT inhaler Inhale 2 puffs into the lungs every 6 (six) hours as needed for wheezing or shortness of breath. 11/25/17   Armandina Stammer I, NP  amLODipine (NORVASC) 10 MG tablet Take 1 tablet by mouth daily. 02/11/22   [provider]  ARIPiprazole (ABILIFY) 30 MG tablet Take 30 mg by mouth daily. 01/26/22    [provider]  atorvastatin (LIPITOR) 10 MG tablet Take 10 mg by mouth daily. 02/08/22   [provider]  buPROPion (WELLBUTRIN XL) 150 MG 24 hr tablet Take 1 tablet (150 mg total) by mouth daily after breakfast. For depression Patient taking differently: Take 150 mg by mouth daily after breakfast. Take with 300mg  tablet for a total daily dose of 450mg  for depression 11/26/17   Nwoko,  Nelda Marseille, NP  buPROPion (WELLBUTRIN XL) 300 MG 24 hr tablet Take 300 mg by mouth daily.    [provider]  cetirizine (ZYRTEC) 10 MG tablet Take 10 mg by mouth 2 (two) times daily.    [provider]  cinacalcet (SENSIPAR) 30 MG tablet Take 30 mg by mouth daily. 03/10/22   [provider]  citalopram (CELEXA) 40 MG tablet Take 40 mg by mouth daily.    [provider]  cloNIDine (CATAPRES) 0.3 MG tablet Take 0.3 mg by mouth 3 (three) times daily.     [provider]  Dulaglutide 4.5 MG/0.5ML SOPN Inject 4.5 mg into the skin once a week. Every Tuesday 12/09/21   [provider]  gabapentin (NEURONTIN) 300 MG capsule Take 300 mg by mouth at bedtime as needed. pain    [provider]  hydrOXYzine (ATARAX) 25 MG tablet Take 25 mg by mouth at bedtime.    [provider]  insulin aspart (NOVOLOG) 100 UNIT/ML injection USE UP TO 100 UNITS ONCE DAILY IN INSULIN PUMP. DX CODE E11.42 03/10/22   [provider]  Insulin Human (INSULIN PUMP) SOLN Inject 1 each into the skin See admin instructions.    [provider]  labetalol (NORMODYNE) 200 MG tablet Take 2 tablets (400 mg total) by mouth 3 (three) times daily. For high blood pressure 11/25/17   Nwoko, Nicole Kindred I, NP  montelukast (SINGULAIR) 10 MG tablet Take 10 mg by mouth at bedtime.    [provider]  mycophenolate (MYFORTIC) 180 MG EC tablet Take 720 mg by mouth 2 (two) times daily.    [provider]  naloxone [facility]) nasal spray 4 mg/0.1 mL  10/28/21    [provider]  Oxycodone HCl 10 MG TABS Take 10 mg by mouth See admin instructions. Take 1 tablet by mouth by mouth every 4 to 6 hours as needed for sciatic nerve and back pain    [provider]  pantoprazole (PROTONIX) 40 MG tablet Take 1 tablet (40 mg total) by mouth daily. For acid reflux 11/26/17   Armandina Stammer I, NP  prazosin (MINIPRESS) 1 MG capsule Take 1 mg by mouth at bedtime.    [provider]  predniSONE (DELTASONE) 5 MG tablet Take 5 mg by mouth daily with breakfast.    [provider]  simvastatin (ZOCOR) 10 MG tablet Take 1 tablet (10 mg total) by mouth at bedtime. For high cholesterol 11/25/17   Armandina Stammer I, NP  sodium bicarbonate 650 MG tablet Take 1,300 mg by mouth 2 (two) times daily.    [provider]  sulfamethoxazole-trimethoprim (BACTRIM,SEPTRA) 400-80 MG tablet Take 1 tablet by mouth 3 (three) times a week. For infection Patient taking differently: Take 1 tablet by mouth every Monday, Wednesday, and Friday. For infection 11/28/17   Armandina Stammer I, NP  tacrolimus (PROGRAF) 1 MG capsule Take 4-5 mg by mouth See admin instructions. Take 5 capsules by mouth in the morning and 4 capsules at night    [provider]  zolpidem (AMBIEN) 5 MG tablet Take 1 tablet (5 mg total) by mouth at bedtime as needed for sleep. 11/25/17   Sanjuana Kava, NP     Physical Exam: Vitals:   10/09/22 2200 10/09/22 2300 10/10/22 0449 10/10/22 0606  BP: 126/69 (!) 125/55 (!) 120/58   Pulse: 83 84 89   Resp: (!) 21 18 20    Temp:  98 F (36.7 C) 98.2 F (36.8 C)  TempSrc:  Oral Oral   SpO2: 94% 93% 93%   Weight:    119 kg  Height:        Physical Exam Constitutional:      General: She is not in acute distress.    Appearance: She is ill-appearing.  Cardiovascular:     Rate and Rhythm: Normal rate and regular rhythm.     Heart sounds: Normal heart sounds.  Pulmonary:     Effort: Pulmonary effort is normal. No accessory muscle usage  or respiratory distress.     Breath sounds: No stridor.  Abdominal:     General: Bowel sounds are normal.     Palpations: Abdomen is soft.  Musculoskeletal:     Right lower leg: No edema.     Left lower leg: No edema.  Skin:    General: Skin is warm.     Capillary Refill: Capillary refill takes less than 2 seconds.  Neurological:     Mental Status: She is oriented to person, place, and time.      Labs on Admission: I have personally reviewed following labs and imaging studies  CBC: Recent Labs  Lab 10/09/22 2009 10/10/22 0216  WBC 16.9* 16.9*  NEUTROABS 13.5*  --   HGB 9.9* 9.6*  HCT 29.8* 29.3*  MCV 77.0* 77.3*  PLT 365 351   Basic Metabolic Panel: Recent Labs  Lab 10/09/22 2009 10/10/22 0216  NA 131* 136  K 2.5* 2.9*  CL 94* 97*  CO2 20* 19*  GLUCOSE 138* 107*  BUN 71* 67*  CREATININE 5.05* 4.24*  CALCIUM 6.8* 7.0*  MG 1.5*  --    GFR: Estimated Creatinine Clearance: 22 mL/min (A) (by C-G formula based on SCr of 4.24 mg/dL (H)). Liver Function Tests: Recent Labs  Lab 10/10/22 0216  AST 13*  ALT 17  ALKPHOS 180*  BILITOT 0.6  PROT 6.8  ALBUMIN 2.6*   No results for input(s): "LIPASE", "AMYLASE" in the last 168 hours. No results for input(s): "AMMONIA" in the last 168 hours. Coagulation Profile: No results for input(s): "INR", "PROTIME" in the last 168 hours. Cardiac Enzymes: Recent Labs  Lab 10/09/22 2009 10/09/22 2209  TROPONINIHS 16 14   BNP (last 3 results) No results for input(s): "BNP" in the last 8760 hours. HbA1C: No results for input(s): "HGBA1C" in the last 72 hours. CBG: No results for input(s): "GLUCAP" in the last 168 hours. Lipid Profile: No results for input(s): "CHOL", "HDL", "LDLCALC", "TRIG", "CHOLHDL", "LDLDIRECT" in the last 72 hours. Thyroid Function Tests: Recent Labs    10/10/22 0216  TSH 0.393   Anemia Panel: Recent Labs    10/10/22 0216  FOLATE 5.6*   Urine analysis:    Component Value Date/Time    COLORURINE YELLOW 08/26/2022 2008   APPEARANCEUR HAZY (A) 08/26/2022 2008   LABSPEC 1.013 08/26/2022 2008   PHURINE 6.0 08/26/2022 2008   GLUCOSEU NEGATIVE 08/26/2022 2008   GLUCOSEU >1000 06/08/2010 0000   HGBUR NEGATIVE 08/26/2022 2008   BILIRUBINUR NEGATIVE 08/26/2022 2008   KETONESUR NEGATIVE 08/26/2022 2008   PROTEINUR NEGATIVE 08/26/2022 2008   UROBILINOGEN 0.2 05/04/2014 1339   NITRITE NEGATIVE 08/26/2022 2008   LEUKOCYTESUR TRACE (A) 08/26/2022 2008    Radiological Exams on Admission: I have personally reviewed images US Renal Transplant w/Doppler  Result Date: 10/10/2022 CLINICAL DATA:  Acute kidney injury.  Status post renal transplant. EXAM: ULTRASOUND OF RENAL TRANSPLANT WITH RENAL DOPPLER ULTRASOUND TECHNIQUE: Ultrasound examination of the renal transplant was performed with gray-scale,  color and duplex doppler evaluation. COMPARISON:  Renal transplant ultrasound 08/28/2022 and 11/06/2021. FINDINGS: Transplant kidney location: Right lower quadrant Transplant Kidney: Renal measurements: 11.6 x 7.5 x 7.3 cm = volume: . Normal in size and parenchymal echogenicity. No evidence of mass or hydronephrosis. No peri-transplant fluid collection seen. Color flow in the main renal artery:  Yes Color flow in the main renal vein:  Yes Duplex Doppler Evaluation: Main Renal Artery Velocity: 159 cm/sec Main Renal Artery Resistive Index: 0.8 Venous waveform in main renal vein:  Present Intrarenal resistive index in upper pole:  0.7 (normal 0.6-0.8; equivocal 0.8-0.9; abnormal >= 0.9) Intrarenal resistive index in lower pole: 0.6 (normal 0.6-0.8; equivocal 0.8-0.9; abnormal >= 0.9) Bladder: Normal for degree of bladder distention. Other findings:  None. IMPRESSION: Normal sonographic appearance of the transplant kidney. Electronically Signed   By: Darliss Cheney M.D.   On: 10/10/2022 02:27   DG Chest 2 View  Result Date: 10/09/2022 CLINICAL DATA:  Chest pain and shortness of breath EXAM: CHEST - 2  VIEW COMPARISON:  Chest x-ray 08/27/2018 FINDINGS: There is airspace disease in the inferior right upper lobe and right lower lobe. There is a small right pleural effusion. Cardiomediastinal silhouette is within normal limits. No acute fractures are seen. IMPRESSION: 1. Airspace disease in the inferior right upper lobe and right lower lobe, concerning for multifocal pneumonia. Follow-up imaging recommended in 4-6 weeks to confirm resolution. 2. Small right pleural effusion. Electronically Signed   By: Darliss Cheney M.D.   On: 10/09/2022 19:14    EKG: My personal interpretation of EKG shows: Normal sinus rhythm,  prolonged QT interval.  No ST and T wave abnormality.    Assessment/Plan: Principal Problem:   Multifocal pneumonia Active Problems:   AKI (acute kidney injury) of transplanted kidney (HCC)   Hypokalemia   Acute hypoxic respiratory failure (HCC)   Asthma, chronic, unspecified asthma severity, with acute exacerbation   GERD (gastroesophageal reflux disease)   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)   H/O renal artery stenosis s/p angioplasty and stent placement right renal artery   CAD (coronary artery disease)   DM (diabetes mellitus) type II controlled with renal manifestation (HCC)   Gastroparesis due to DM (HCC)   Chronic anemia   Metabolic acidosis   Essential hypertension   Hypothyroidism   Peripheral neuropathy   Generalized anxiety disorder   Hypocalcemia    Assessment and Plan: Multifocal pneumonia SIRS -Patient is immunocompromised at the baseline.  She is complaining about progressive cough and shortness of breath over the course of the last 2 to 3 days.  She has mold in her house for last 6 months. -Initial workup showed leukocytosis 16.9 -While in the ED O2 dropped to 80% on room air required nasal cannula oxygen 2 L to improve 93%.   - Initial chest x-ray showed airspace disease in the inferior right upper lobe and right lower lobe  concerning for multifocal pneumonia.  And a small right pleural effusion. -Negative respiratory panel. - Continue broad-spectrum antibiotic with cefepime 2 g IV daily and doxycycline 100 mg twice daily.  ( baseline creatinine around 1.7 and on presentation 5.5.  Cannot start vancomycin and Zosyn in setting of acute AKI) -Obtaining blood culture, sputum culture, urine streptococcal antigen, urine Legionella antigen and LDH.   Also there is concern for pneumocystis jiroveci pneumonia as patient is immunocompromised and developed acute hypoxic respiratory failure. - If feel appropriate please reach out to ID in the morning for  further recommendation. - Follow-up with blood culture and sputum culture for antibiotic guidance. - Continue supplemental oxygen goal O2 sat above 94% - Continue scheduled DuoNebs every 6 hours and as needed albuterol -Continue telemonitoring    Acute hypoxic respiratory failure Acute exacerbation of asthma - At home patient is on albuterol as needed, singular 10 mg daily and cetirizine 10 mg daily. -Patient is complaining about increased cough, shortness of breath and chest x-ray showed evidence of pneumonia.  Asthma exacerbation secondary from in the setting of pneumonia. -Continue DuoNebs scheduled and albuterol as needed - Starting prednisone 40 mg oral daily for 5 days.  Patient is allergic to azithromycin so giving doxycycline instead. 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  Currently follows transplant nephrology at [facility]. -Creatinine on presentation 5.05 (baseline creatinine between 1.3 to 1.8) and baseline GFR around 58 of the transplant kidney. - Patient is borderline hypotensive on initial presentation not exactly sure  the cause of acute kidney injury however it can be some intrarenal insult as well. - Checking UA, UPCR, urine creatinine and urine sodium. -Ordered ultrasound renal transplant and pending result. -Consulted with on-call nephrology Dr. Arrie Aran who recommended decrease the dose of Myfortic 720mg  to 360 mg twice daily, continue prednisone and tacrolimus at home dose.  He will evaluate patient in the morning. -In the ED patient got LR 1 L bolus.   -Continue gentle hydration LR 75 cc/h. - Placing an Foley, monitor strict I's/O -Checking BK, CMV and EBV quantitative PCR. - Continue Myfortic 360 mg twice daily, Prograf 5 mg in the morning and 4 mg in the evening .  Currently getting prednisone 40 mg daily for 5 days course for acute exacerbation of asthma.  After the 5 days course can resume regular prednisone 5 mg which is her immunosuppressant. - Avoid nephrotoxic agents when possible.   History of nonobstructive CAD - Patient currently not on any aspirin and Plavix. - Continue Lipitor 10 mg daily  Diabetes mellitus type 2 -Per chart review most recent A1c 6.7. - Patient is on insulin pump -Continue insulin pump while in the hospital, continue to check blood glucose 3 times daily with meals and at bedtime.  Chronic anemia -Hemoglobin 9.6.  Patient's baseline hemoglobin around 11.3 -No evidence of active bleeding - At home patient is not any iron supplement.  Checking anemia panel and starting oral iron supplement.   Anion gap metabolic acidosis Patient has history of chronic on and comfortably acidosis - Low bicarb 19 and elevated anion gap 20 - Checking ABG -Ordered sodium bicarb 1300 mg twice daily  Hypokalemia Hypomagnesemia -Potassium 2.6 on initial presentation.  Received total 80 mg.  No potassium and continuing LR with KCl 75 cc/h for 1 day. -Mag 1.5 on presentation.  Repleted. -Continue to monitor electrolytes and replete  Essential hypertension -Patient's home  blood pressure regimen includes amlodipine 10 mg daily, clonidine point 3 mg twice daily, labetalol 400 mg 3 times daily. -Holding blood pressure regimen as blood pressure is borderline soft.  Will resume when appropriate  Peripheral neuropathy Holding gabapentin as patient blood pressure is borderline soft  Generalized anxiety disorder Insomnia -Per chart review found out that patient is on Wellbutrin XL 300 mg daily, Celexa 40 mg daily, Atarax 25 mg at bedtime  and Ambien 5 mg at bedtime as needed patient unable to tell me which medication actually she is taking right now. Requested pharmacy for medication reconciliation. - Start as appropriate  Chronic hypocalcemia Chronic demented deficiency - Resumed home Cinacalcet 30 mg daily   DVT prophylaxis: Lovenox Code Status: Full Code Diet: Carb consistent diet Family Communication: Spoke with patient's sister over the phone at the bedside. Disposition Plan: Plan to discharge to home in 3 to 4 days. Consults: [facility] [place] [facility] Dr. Arrie Aran Admission status: Inpatient, Med-Surg  Severity of Illness: The appropriate patient status for this patient is INPATIENT. Inpatient status is judged to be reasonable and necessary in order to provide the required intensity of service to ensure the patient's safety. The patient's presenting symptoms, physical exam findings, and initial radiographic and laboratory data in the context of their chronic comorbidities is felt to place them at high risk for further clinical deterioration. Furthermore, it is not anticipated that the patient will be medically stable for discharge from the hospital within 2 midnights of admission.   * I certify that at the point of admission it is my clinical judgment that the patient will require inpatient hospital care spanning beyond 2 midnights from the point of admission due to high intensity of service, high risk for further deterioration and high frequency of  surveillance required.Marland Kitchen    Tereasa Coop MD [facility]  How to contact the [facility] Attending or Consulting provider 7A - 7P or covering provider during after hours 7P -7A, for this patient?   Check the care team in [facility] and look for a) attending/consulting TR[facility] provider listed and b) the [facility] team listed Log into [email or web address] and use [place]'s universal password to access. If you do not have the password, please contact the hospital operator. Locate the [facility] provider you are looking for under [facility] and page to a number that you can be directly reached. If you still have difficulty reaching the provider, please page the [facility] (Director on Call) for the Hospitalists listed on amion for assistance.  10/10/2022, 6:44 AM

## 2022-10-10 NOTE — Progress Notes (Signed)
ABG collected and sent to lab 

## 2022-10-10 NOTE — Progress Notes (Signed)
New Admission Note:    Arrival Method: Care link stretcher Mental Orientation: a/ox4 Telemetry: box 19 Assessment: completed Skin: intact IV: left Forearm, left AC Pain: n/a Tubes: n/a Safety Measures: non skid socks Admission: completed Orientation: Patient has been oriented to the room, unit and staff.  Family: n/a Belongings: kept at bedside  Orders have been reviewed and implemented. Will continue to monitor the patient. Call light has been placed within reach and bed alarm has been activated.   Fabian Sharp BSN, RN-BC Phone number: [phone]

## 2022-10-10 NOTE — Plan of Care (Signed)

## 2022-10-10 NOTE — Evaluation (Signed)
Clinical/Bedside Swallow Evaluation Patient Details  Name: Linda Ellison MRN: [id number] Date of Birth: [birth year]  Today's Date: 10/10/2022 Time: SLP Start Time (ACUTE ONLY): 4098 SLP Stop Time (ACUTE ONLY): 1007 SLP Time Calculation (min) (ACUTE ONLY): 11 min  Past Medical History:  Past Medical History:  Diagnosis Date   Allergy    Anemia associated with chronic renal failure    Anxiety    Asthma    Atypical chest pain    long-standing -- normal cardio cath 06-27-2012 and normal nuclear stress test 06-25-2016   AV (arteriovenous fistula) (HCC)    for dialysis-- currently located left radiocephalic   CAD (coronary artery disease) cardiologist-- dr Sharol Roussel [facility] in [place])   a. False positive stress echo 06/2012 at [facility] - cath with no obstructive CAD at [facility] (mild luminal irregularities in LAD, moderate diffuse disease in distal RCA).   Depression    Elevated lipids    ESRD on hemodialysis [facility]) NEPHROLOGIST-  DR MATTINGLY   started dialysis 01/29/11: Foye Spurling [facility] on MWF   Gastroparesis    GERD (gastroesophageal reflux disease)    History of pneumonia    HCAP 04/ 2017   Hyperlipidemia    Hyperthyroidism    Menorrhagia    Other secondary hypertension    associated to diabetes -- followed by cardiologist ( dr Sharol Roussel)     Peripheral neuropathy    PONV (postoperative nausea and vomiting)    Pre-transplant evaluation for kidney transplant    [facility]   Renal failure syndrome 03/05/2022   Secondary hyperparathyroidism of renal origin [facility])    Sleep apnea    Type 2 diabetes mellitus, with long-term current use of insulin (HCC)    dx 1985   Past Surgical History:  Past Surgical History:  Procedure Laterality Date   AV FISTULA PLACEMENT  08/2010   Left radiocephalic AVF   AV FISTULA PLACEMENT Right 05/07/2014   Procedure: ARTERIOVENOUS (AV) FISTULA CREATION;  Surgeon: Sherren Kerns, MD;   Location: [facility] OR;  Service: Vascular;  Laterality: Right;   AV FISTULA PLACEMENT Right 07/02/2014   Procedure: INSERTION OF ARTERIOVENOUS (AV) GORE-TEX GRAFT ARM;  Surgeon: Sherren Kerns, MD;  Location: [facility] OR;  Service: Vascular;  Laterality: Right;   CARDIOVASCULAR STRESS TEST  06/25/2016   [facility]   normal nuclear perfusion study w/ no ischemia/  normal LV function and wall motion , ef 51%   CESAREAN SECTION  03/22/2001   w/ Bilateral Tubal Ligation   COLONOSCOPY     COLONOSCOPY WITH ESOPHAGOGASTRODUODENOSCOPY (EGD)  10/19/2011   DILATION AND CURETTAGE OF UTERUS  05/12/2000   w/ suction for missed ab   DOBUTAMINE STRESS ECHO  06/04/2016    [facility]   normal stress echo w/ no chest pain or ischemia/  normal LV function and wall motion , stress ef 60-65%   ESOPHAGOGASTRODUODENOSCOPY  10/19/2011   Dr. Stan Head   FEMUR IM NAIL Left child   removed   FOOT SURGERY Left 2014 approx.   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   LAPAROSCOPIC CHOLECYSTECTOMY  1994   LEFT HEART CATHETERIZATION WITH CORONARY ANGIOGRAM N/A 06/27/2012   Procedure: LEFT HEART CATHETERIZATION WITH CORONARY ANGIOGRAM;  Surgeon: Laurey Morale, MD;  Location: [facility] CATH LAB;  Service: Cardiovascular;  Laterality: N/A;  No ostructive CAD, normal LVF, ef 55-60% (mild LAD luminal irregularities, moderate dRCA diffuse disease)   LIGATION GORETEX FISTULA  01/04/11   Left AVF   LIGATION OF ARTERIOVENOUS  FISTULA Left 08/21/2014   Procedure: LIGATION OF ARTERIOVENOUS  FISTULA;  Surgeon: Annice Needy, MD;  Location: [facility] ORS;  Service: Vascular;  Laterality: Left;   LIGATION OF ARTERIOVENOUS  FISTULA Left 06/29/2017   Procedure: LIGATION OF ARTERIOVENOUS  FISTULA ( RADIOCEPHALIC );  Surgeon: Annice Needy, MD;  Location: [facility] ORS;  Service: Vascular;   Laterality: Left;   NEPHRECTOMY TRANSPLANTED ORGAN     PERIPHERAL VASCULAR CATHETERIZATION Left 08/08/2014   Procedure: Upper Extremity Angiography;  Surgeon: Annice Needy, MD;  Location: [facility] INVASIVE CV LAB;  Service: Cardiovascular;  Laterality: Left;   PERIPHERAL VASCULAR CATHETERIZATION Left 08/08/2014   Procedure: Upper Extremity Intervention;  Surgeon: Annice Needy, MD;  Location: [facility] INVASIVE CV LAB;  Service: Cardiovascular;  Laterality: Left;   PERIPHERAL VASCULAR CATHETERIZATION Left 03/08/2016   Procedure: A/V Fistulagram;  Surgeon: Annice Needy, MD;  Location: [facility] INVASIVE CV LAB;  Service: Cardiovascular;  Laterality: Left;   POLYPECTOMY     THROMBECTOMY AND REVISION OF ARTERIOVENTOUS (AV) GORETEX  GRAFT Right 07/16/2014   Procedure: THROMBECTOMY  Right  arm  ARTERIOVENOUS  GORETEX  GRAFT;  Surgeon: Sherren Kerns, MD;  Location: [facility] OR;  Service: Vascular;  Laterality: Right;   TRANSTHORACIC ECHOCARDIOGRAM  06/04/2016   mild concentric LVH, ef 55-60%,  mild TR,  borderline LAE,  trivial MR and PR   HPI:  Pt is a 47 y.o. 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  Patient denies any fever, chill, diarrhea, headache, blurry vision, chest pain, and palpitation.  Per chart review she was recently admitted 08/26/2022 to 08/28/2022 in the setting of norovirus associated diarrhea, hypokalemia and hypomagnesemia. She is complaining me that for last 5 to 6 months she has mold inside her house. CXR yesterday indicated concerns multifocal pneumonia. Pt  reported difficulty with initiating a swallow and choking on thins in July of '23, pt reports that has not been a concern for a long time- unable to provide timeline.    Assessment / Plan / Recommendation  Clinical Impression  Pt seen for skilled ST evaluation for swallowing abilities. Pt is currently on a regular/thin liquid diet and was assessed with thin liquid, puree, and regular solids. The pts OME was [facility]. The pt consumed x3 sips of thin liquid, x3 spoonful's of puree, and x2 solids with no overt s/sx of aspiration. The pt had a slight throat clearance following an approx. two-minute delay from PO intake. The pt has a hx of GERD, which she says is well managed by medication. Pt swallow WFL. Safest diet remains regular/thin liquid with adherence to GERD precautions to reduce s/sx of reflux. SLP signing off, please reconsult if further needs arise. SLP Visit Diagnosis: Dysphagia, unspecified (R13.10)    Aspiration Risk  No limitations    Diet Recommendation Regular;Thin liquid    Medication Administration: Whole meds with liquid Supervision: Patient able to self feed Compensations: Small sips/bites;Slow rate Postural Changes: Seated upright at 90  degrees;Remain upright for at least 30 minutes after po intake    Other  Recommendations      Recommendations for follow up therapy are one component of a multi-disciplinary discharge planning process, led by the attending physician.  Recommendations may be updated based on patient status, additional functional criteria and insurance authorization.  Follow up Recommendations No SLP follow up      Assistance Recommended at Discharge    Functional Status Assessment Patient has had a recent decline in their functional status and demonstrates the ability to make significant improvements in function in a reasonable and predictable amount of time.  Frequency and Duration            Prognosis        Swallow Study   General Date of Onset:  10/10/22 HPI: Pt is a 47 y.o. 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  Patient denies any fever, chill, diarrhea, headache, blurry vision, chest pain, and palpitation.  Per chart review she was recently admitted 08/26/2022 to 08/28/2022 in the setting of norovirus associated diarrhea, hypokalemia and hypomagnesemia. She is complaining me that for last 5 to 6 months she has mold inside her house. CXR yesterday indicated concerns multifocal pneumonia. Pt reported difficulty with initiating a swallow and choking on thins in July of '23, pt reports that has not been a concern for a long time- unable to provide timeline. Type of Study: Bedside Swallow Evaluation Previous Swallow Assessment: n/a Diet Prior to this Study: Regular;Thin liquids (Level 0) Temperature Spikes Noted: No Respiratory Status: Room air History of Recent Intubation: No Behavior/Cognition: Cooperative;Pleasant mood;Lethargic/Drowsy Oral Cavity Assessment: Within Functional Limits Oral Care Completed by SLP: No Oral Cavity - Dentition: Adequate natural dentition Vision: Functional for self-feeding Self-Feeding Abilities: Able to feed self Patient Positioning: Upright in bed Baseline Vocal Quality: Normal Volitional Cough: Strong;Congested Volitional Swallow: Able to elicit    Oral/Motor/Sensory Function Overall Oral Motor/Sensory Function: Within functional limits   Ice Chips     Thin Liquid Thin Liquid: Within functional limits Presentation: Cup;Self Fed    Nectar Thick      Honey Thick     Puree Puree: Within functional limits Presentation: Self Fed;Spoon   Solid     Solid: Within functional limits Presentation: Self Fed      Dione Housekeeper M.S. CF-SLP

## 2022-10-10 NOTE — Progress Notes (Signed)
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  Patient noted to have been recently admitted 08/26/2022-08/28/2022 in the setting of norovirus diarrhea, hypokalemia and hypomagnesemia.  Patient endorsed compliance with immunosuppressive medications at home.  Patient seen in the ED noted to have desatted to 80% on room air which improved with nasal cannula, lab work done concerning for hypokalemia and hypomagnesemia.  CBC with a leukocytosis.  Patient also noted to be in acute on chronic renal failure with a creatinine of 5.05 on presentation and baseline approximately 1.5.  Chest x-ray done concerning for multifocal pneumonia.  Patient placed empirically on IV antibiotics.  Patient admitted for further evaluation and management.   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)   History of CAD (coronary artery disease)   CAD (coronary artery disease)   DM (diabetes mellitus) type II controlled with renal manifestation (HCC)   Gastroparesis due to DM (HCC)   Chronic  anemia   Metabolic acidosis   Essential hypertension   Peripheral neuropathy   Generalized anxiety disorder   Hypocalcemia  #1 acute respiratory failure with hypoxia likely secondary to multifocal pneumonia and acute asthma exacerbation/sepsis -Patient presenting with generalized fatigue, shortness of breath, noted to be hypoxic on admission with sats of 80% on room air requiring nasal cannula, patient with a leukocytosis on admission of 16.9, noted to be in acute renal failure and chest x-ray concerning for multifocal pneumonia. -Per admitting physician patient noted to have mold in the house for at least the past 6 months. -Respiratory viral panel negative. -SARS coronavirus 2 PCR negative, influenza A and B PCR negative. -Patient states no significant clinical improvement since admission. -Sputum Gram stain and culture pending.  Blood cultures pending. -Check MRSA PCR. -Urine Legionella antigen pending.  Urine pneumococcus antigen pending. -LDH within normal limits at 168. -Continue empiric antibiotics of doxycycline, cefepime. -Continue scheduled DuoNebs, prednisone. -Placed on Claritin, PPI, Mucinex. -Supportive care.  2.  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 -Creatinine on presentation noted at 5.05 with baseline creatinine 1.3-1.8. -Patient noted on admission to have borderline hypotension. -Acute kidney injury likely secondary to a prerenal azotemia in the setting of low blood pressure, GI losses, poor oral intake. -Transplant ultrasound negative for hydronephrosis. -Renal function slowly improving with hydration creatinine currently at 4.06 from 5.05 on admission. -Nephrology  consulted and feel no indication for dialysis at this time and to monitor closely with hydration. -Avoid nephrotoxins. -  Nephrology recommended decreasing patient's Myfortic to 360 mg twice daily, continuation of Prograf 5 mg every morning and 4 mg every afternoon.  Patient on steroids due to problem #1. -Per nephrology.  3.  Hypomagnesemia/hypokalemia -Likely secondary to GI losses. -Magnesium sulfate 2 g IV x 1. -K-Dur 40 mEq p.o. x 1.  4. insulin-dependent diabetes mellitus type 1 -Hemoglobin A1c 6.7. -Continue insulin pump.  5.  History of nonobstructive CAD -Patient not on any antiplatelet therapy. -Continue statin.  6.  Anemia of chronic disease -Follow H&H.  7.  Anion gap metabolic acidosis -Likely secondary to acute kidney injury.  Patient also noted to have had some recent diarrhea. -Improving with hydration.  8.  Hypertension -Continue to hold antihypertensive medications as blood pressure noted to be soft on admission.  9.  Peripheral neuropathy -Gabapentin on hold.  10.  General anxiety disorder/insomnia -Resume home regimen Abilify and Wellbutrin. -Continue to hold Celexa for now.  11.  Chronic hypocalcemia -Continue Cinacalcet. -   DVT prophylaxis: Heparin Code Status: Full Family Communication: Updated patient and family on speaker phone. Disposition: Likely home when clinically improved and renal function back to baseline  Status is: Inpatient The patient will require care spanning > 2 midnights and should be moved to inpatient because: Severity of illness   Consultants:  Nephrology: Dr. Arrie Aran 10/10/2022  Procedures:  -Chest x-ray 10/09/2022 Transplant ultrasound 10/10/2022    Antimicrobials:  Anti-infectives (From admission, onward)    Start     Dose/Rate Route Frequency Ordered Stop   10/11/22 1000  doxycycline (VIBRA-TABS) tablet 100 mg  Status:  Discontinued        100 mg Oral Daily 10/10/22 0117 10/10/22 0126   10/11/22 0000  ceFEPIme  (MAXIPIME) 2 g in sodium chloride 0.9 % 100 mL IVPB  Status:  Discontinued        2 g 200 mL/hr over 30 Minutes Intravenous Every 24 hours 10/10/22 0122 10/10/22 0123   10/11/22 0000  ceFEPIme (MAXIPIME) 1 g in sodium chloride 0.9 % 100 mL IVPB        1 g 200 mL/hr over 30 Minutes Intravenous Every 24 hours 10/10/22 0123     10/10/22 0215  doxycycline (VIBRA-TABS) tablet 100 mg        100 mg Oral Every 12 hours 10/10/22 0126 10/16/22 2159   10/09/22 2315  ceFEPIme (MAXIPIME) 2 g in sodium chloride 0.9 % 100 mL IVPB        2 g 200 mL/hr over 30 Minutes Intravenous  Once 10/09/22 2303 10/10/22 0135   10/09/22 2130  doxycycline (VIBRAMYCIN) 100 mg in sodium chloride 0.9 % 250 mL IVPB        100 mg 125 mL/hr over 120 Minutes Intravenous  Once 10/09/22 2115 10/10/22 0011   10/09/22 2053  ceFEPIme (MAXIPIME) 1 g in sodium chloride 0.9 % 100 mL IVPB  Status:  Discontinued        1 g 200 mL/hr over 30 Minutes Intravenous Every 24 hours 10/09/22 2054 10/09/22 2303   10/09/22 2045  ceFEPIme (MAXIPIME) 2 g in sodium chloride 0.9 % 100 mL IVPB  Status:  Discontinued        2 g 200 mL/hr over 30 Minutes Intravenous Every 24 hours 10/09/22 2040 10/09/22 2054         Subjective: Laying in bed.  States no significant improvement with shortness of breath or cough.  Complaining of chest pain with coughing.  Does not feel any significant improvement since admission.  Objective: Vitals:   10/10/22 0449 10/10/22 0606 10/10/22 0834 10/10/22 1150  BP: (!) 120/58  122/61 106/62  Pulse: 89  90 87  Resp: 20  20 18   Temp: 98.2 F (36.8 C)  98.4 F (36.9 C) 98.4 F (36.9 C)  TempSrc: Oral  Oral Oral  SpO2: 93%  94% (!) 88%  Weight:  119 kg    Height:        Intake/Output Summary (Last 24 hours) at 10/10/2022 1204 Last data filed at 10/10/2022 0900 Gross per 24 hour  Intake 1600 ml  Output 0 ml  Net 1600 ml   Filed Weights   10/09/22 1844 10/10/22 0606  Weight: 114.3 kg 119 kg     Examination:  General exam: Appears calm and comfortable  Respiratory system: Coarse BS on right > left.  No wheezing.  Fair air movement.  Speaking in full sentences.   Cardiovascular system: S1 & S2 heard, RRR. No JVD, murmurs, rubs, gallops or clicks.  1+ bilateral lower extremity edema. Gastrointestinal system: Abdomen is nondistended, soft and nontender. No organomegaly or masses felt. Normal bowel sounds heard. Central nervous system: Alert and oriented. No focal neurological deficits. Extremities: Symmetric 5 x 5 power. Skin: No rashes, lesions or ulcers Psychiatry: Judgement and insight appear normal. Mood & affect appropriate.     Data Reviewed: I have personally reviewed following labs and imaging studies  CBC: Recent Labs  Lab 10/09/22 2009 10/10/22 0216  WBC 16.9* 16.9*  NEUTROABS 13.5*  --   HGB 9.9* 9.6*  HCT 29.8* 29.3*  MCV 77.0* 77.3*  PLT 365 351    Basic Metabolic Panel: Recent Labs  Lab 10/09/22 2009 10/10/22 0216 10/10/22 0636  NA 131* 136 134*  K 2.5* 2.9* 3.2*  CL 94* 97* 99  CO2 20* 19* 21*  GLUCOSE 138* 107* 96  BUN 71* 67* 67*  CREATININE 5.05* 4.24* 4.06*  CALCIUM 6.8* 7.0* 7.0*  MG 1.5*  --  1.4*    GFR: Estimated Creatinine Clearance: 22.9 mL/min (A) (by C-G formula based on SCr of 4.06 mg/dL (H)).  Liver Function Tests: Recent Labs  Lab 10/10/22 0216  AST 13*  ALT 17  ALKPHOS 180*  BILITOT 0.6  PROT 6.8  ALBUMIN 2.6*    CBG: Recent Labs  Lab 10/10/22 0723 10/10/22 1147  GLUCAP 117* 145*     Recent Results (from the past 240 hour(s))  Resp panel by RT-PCR (RSV, Flu A&B, Covid) Anterior Nasal Swab     Status: None   Collection Time: 10/09/22  8:21 PM   Specimen: Anterior Nasal Swab  Result Value Ref Range Status   SARS Coronavirus 2 by RT PCR NEGATIVE NEGATIVE Final    Comment: (NOTE) SARS-CoV-2 target nucleic acids are NOT DETECTED.  The SARS-CoV-2 RNA is generally detectable in upper  respiratory specimens during the acute phase of infection. The lowest concentration of SARS-CoV-2 viral copies this assay can detect is 138 copies/mL. A negative result does not preclude SARS-Cov-2 infection and should not be used as the sole basis for treatment or other patient management decisions. A negative result may occur with  improper specimen collection/handling, submission of specimen other than nasopharyngeal swab, presence of viral mutation(s) within the areas targeted by this assay, and inadequate number of viral copies(<138 copies/mL). A negative result must be combined with clinical observations, patient history, and epidemiological information. The expected result is Negative.  Fact Sheet for Patients:  BloggerCourse.com  Fact Sheet for Healthcare Providers:  SeriousBroker.it  This test is no t yet approved or cleared by the [place] and  has been authorized for detection and/or diagnosis of SARS-CoV-2 by FDA under an Emergency Use Authorization (EUA). This EUA will remain  in effect (meaning this test can be used) for the duration of the COVID-19 declaration under Section 564(b)(1) of the Act, 21 U.S.C.section 360bbb-3(b)(1), unless the authorization is terminated  or revoked sooner.       Influenza A by PCR NEGATIVE NEGATIVE Final   Influenza B by PCR NEGATIVE NEGATIVE Final    Comment: (NOTE) The Xpert Xpress SARS-CoV-2/FLU/RSV plus assay is intended as an aid in the diagnosis of influenza from Nasopharyngeal swab specimens and should not be used as a sole basis for treatment. Nasal washings and aspirates are unacceptable for Xpert Xpress SARS-CoV-2/FLU/RSV testing.  Fact Sheet for Patients: BloggerCourse.com  Fact Sheet for Healthcare Providers: SeriousBroker.it  This test is not yet approved or cleared by the [place] FDA and has been  authorized for detection and/or diagnosis of SARS-CoV-2 by FDA under an Emergency Use Authorization (EUA). This EUA will remain in effect (meaning this test can be used) for the duration of the COVID-19 declaration under Section 564(b)(1) of the Act, 21 U.S.C. section 360bbb-3(b)(1), unless the authorization is terminated or revoked.     Resp Syncytial Virus by PCR NEGATIVE NEGATIVE Final    Comment: (NOTE) Fact Sheet for Patients: BloggerCourse.com  Fact Sheet for Healthcare Providers: SeriousBroker.it  This test is not yet approved or cleared by the [place] FDA and has been authorized for detection and/or diagnosis of SARS-CoV-2 by FDA under an Emergency Use Authorization (EUA). This EUA will remain in effect (meaning this test can be used) for the duration of the COVID-19 declaration under Section 564(b)(1) of the Act, 21 U.S.C. section 360bbb-3(b)(1), unless the authorization is terminated or revoked.  Performed at [facility], [address] [place], [place] [address]   Blood culture (routine x 2)     Status: None (Preliminary result)   Collection Time: 10/09/22  8:50 PM   Specimen: BLOOD LEFT HAND  Result Value Ref Range Status   Specimen Description   Final    BLOOD LEFT HAND Performed at [facility], [address], [place], [place] [address]    Special Requests   Final    BOTTLES DRAWN AEROBIC AND ANAEROBIC Blood Culture adequate volume Performed at [facility], [address] [place], [place] [address]    Culture PENDING  Incomplete   Report Status PENDING  Incomplete  Blood culture (routine x 2)     Status: None (Preliminary result)   Collection Time: 10/09/22  9:33 PM   Specimen: BLOOD RIGHT HAND  Result Value Ref Range Status   Specimen Description   Final    BLOOD RIGHT HAND Performed at [facility], [address] [place], [place] [address]    Special  Requests   Final    BOTTLES DRAWN AEROBIC AND ANAEROBIC Blood Culture adequate volume Performed at [facility], [address] [place], [place] [address]    Culture PENDING  Incomplete   Report Status PENDING  Incomplete  Respiratory (~20 pathogens) panel by PCR     Status: None   Collection Time: 10/10/22  3:10 AM   Specimen: Nasopharyngeal Swab; Respiratory  Result Value Ref Range Status   Adenovirus NOT DETECTED NOT DETECTED Final   Coronavirus 229E NOT DETECTED NOT  DETECTED Final    Comment: (NOTE) The Coronavirus on the Respiratory Panel, DOES NOT test for the novel  Coronavirus (2019 nCoV)    Coronavirus HKU1 NOT DETECTED NOT DETECTED Final   Coronavirus NL63 NOT DETECTED NOT DETECTED Final   Coronavirus OC43 NOT DETECTED NOT DETECTED Final   Metapneumovirus NOT DETECTED NOT DETECTED Final   Rhinovirus / Enterovirus NOT DETECTED NOT DETECTED Final   Influenza A NOT DETECTED NOT DETECTED Final   Influenza B NOT DETECTED NOT DETECTED Final   Parainfluenza Virus 1 NOT DETECTED NOT DETECTED Final   Parainfluenza Virus 2 NOT DETECTED NOT DETECTED Final   Parainfluenza Virus 3 NOT DETECTED NOT DETECTED Final   Parainfluenza Virus 4 NOT DETECTED NOT DETECTED Final   Respiratory Syncytial Virus NOT DETECTED NOT DETECTED Final   Bordetella pertussis NOT DETECTED NOT DETECTED Final   Bordetella Parapertussis NOT DETECTED NOT DETECTED Final   Chlamydophila pneumoniae NOT DETECTED NOT DETECTED Final   Mycoplasma pneumoniae NOT DETECTED NOT DETECTED Final    Comment: Performed at [facility], [address], [place], [place] [address]         Radiology Studies: US Renal Transplant w/Doppler  Result Date: 10/10/2022 CLINICAL DATA:  Acute kidney injury.  Status post renal transplant. EXAM: ULTRASOUND OF RENAL TRANSPLANT WITH RENAL DOPPLER ULTRASOUND TECHNIQUE: Ultrasound examination of the renal transplant was performed with gray-scale, color and duplex doppler  evaluation. COMPARISON:  Renal transplant ultrasound 08/28/2022 and 11/06/2021. FINDINGS: Transplant kidney location: Right lower quadrant Transplant Kidney: Renal measurements: 11.6 x 7.5 x 7.3 cm = volume: . Normal in size and parenchymal echogenicity. No evidence of mass or hydronephrosis. No peri-transplant fluid collection seen. Color flow in the main renal artery:  Yes Color flow in the main renal vein:  Yes Duplex Doppler Evaluation: Main Renal Artery Velocity: 159 cm/sec Main Renal Artery Resistive Index: 0.8 Venous waveform in main renal vein:  Present Intrarenal resistive index in upper pole:  0.7 (normal 0.6-0.8; equivocal 0.8-0.9; abnormal >= 0.9) Intrarenal resistive index in lower pole: 0.6 (normal 0.6-0.8; equivocal 0.8-0.9; abnormal >= 0.9) Bladder: Normal for degree of bladder distention. Other findings:  None. IMPRESSION: Normal sonographic appearance of the transplant kidney. Electronically Signed   By: Darliss Cheney M.D.   On: 10/10/2022 02:27   DG Chest 2 View  Result Date: 10/09/2022 CLINICAL DATA:  Chest pain and shortness of breath EXAM: CHEST - 2 VIEW COMPARISON:  Chest x-ray 08/27/2018 FINDINGS: There is airspace disease in the inferior right upper lobe and right lower lobe. There is a small right pleural effusion. Cardiomediastinal silhouette is within normal limits. No acute fractures are seen. IMPRESSION: 1. Airspace disease in the inferior right upper lobe and right lower lobe, concerning for multifocal pneumonia. Follow-up imaging recommended in 4-6 weeks to confirm resolution. 2. Small right pleural effusion. Electronically Signed   By: Darliss Cheney M.D.   On: 10/09/2022 19:14        Scheduled Meds:  atorvastatin  10 mg Oral Daily   cinacalcet  30 mg Oral Q breakfast   doxycycline  100 mg Oral Q12H   folic acid  1 mg Oral Daily   guaiFENesin  1,200 mg Oral BID   heparin  5,000 Units Subcutaneous Q8H   hydrOXYzine  25 mg Oral QHS   loratadine  10 mg Oral Daily    montelukast  10 mg Oral QHS   multivitamins with iron  1 tablet Oral Daily   mycophenolate  360 mg  Oral BID   pantoprazole  40 mg Oral Daily   predniSONE  40 mg Oral Q breakfast   sodium bicarbonate  1,300 mg Oral BID   tacrolimus  4 mg Oral QHS   tacrolimus  5 mg Oral Daily   Continuous Infusions:  sodium chloride 10 mL/hr at 10/09/22 2209   [START ON 10/11/2022] ceFEPime (MAXIPIME) IV     lactated ringers 1,000 mL with potassium chloride 40 mEq infusion 75 mL/hr at 10/10/22 9629   magnesium sulfate bolus IVPB 2 g (10/10/22 1116)     LOS: 0 days    Time spent: 40 minutes    Ramiro Harvest, MD [facility]   To contact the attending provider between 7A-7P or the covering provider during after hours 7P-7A, please log into the web site [email or web address] and access using universal [place] password for that web site. If you do not have the password, please call the hospital operator.  10/10/2022, 12:04 PM

## 2022-10-11 ENCOUNTER — Inpatient Hospital Stay (HOSPITAL_COMMUNITY): Payer: Medicare (Managed Care)

## 2022-10-11 DIAGNOSIS — J45901 Unspecified asthma with (acute) exacerbation: Secondary | ICD-10-CM | POA: Diagnosis not present

## 2022-10-11 DIAGNOSIS — I10 Essential (primary) hypertension: Secondary | ICD-10-CM | POA: Diagnosis not present

## 2022-10-11 DIAGNOSIS — J189 Pneumonia, unspecified organism: Secondary | ICD-10-CM | POA: Diagnosis not present

## 2022-10-11 DIAGNOSIS — E872 Acidosis, unspecified: Secondary | ICD-10-CM | POA: Diagnosis not present

## 2022-10-11 LAB — BRAIN NATRIURETIC PEPTIDE: B Natriuretic Peptide: 1202.6 pg/mL — ABNORMAL HIGH (ref 0.0–100.0)

## 2022-10-11 LAB — CMV DNA, QUANTITATIVE, PCR
CMV DNA Quant: NEGATIVE IU/mL
Log10 CMV Qn DNA Pl: UNDETERMINED log10 IU/mL

## 2022-10-11 LAB — GLUCOSE, CAPILLARY
Glucose-Capillary: 166 mg/dL — ABNORMAL HIGH (ref 70–99)
Glucose-Capillary: 186 mg/dL — ABNORMAL HIGH (ref 70–99)
Glucose-Capillary: 270 mg/dL — ABNORMAL HIGH (ref 70–99)
Glucose-Capillary: 379 mg/dL — ABNORMAL HIGH (ref 70–99)
Glucose-Capillary: 358 mg/dL — ABNORMAL HIGH (ref 70–99)

## 2022-10-11 LAB — RENAL FUNCTION PANEL
Albumin: 2.6 g/dL — ABNORMAL LOW (ref 3.5–5.0)
Anion gap: 16 — ABNORMAL HIGH (ref 5–15)
BUN: 72 mg/dL — ABNORMAL HIGH (ref 6–20)
CO2: 18 mmol/L — ABNORMAL LOW (ref 22–32)
Calcium: 7.5 mg/dL — ABNORMAL LOW (ref 8.9–10.3)
Chloride: 99 mmol/L (ref 98–111)
Creatinine, Ser: 2.21 mg/dL — ABNORMAL HIGH (ref 0.44–1.00)
GFR, Estimated: 27 mL/min — ABNORMAL LOW (ref >60)
Glucose, Bld: 236 mg/dL — ABNORMAL HIGH (ref 70–99)
Phosphorus: 4.3 mg/dL (ref 2.5–4.6)
Potassium: 4.4 mmol/L (ref 3.5–5.1)
Sodium: 133 mmol/L — ABNORMAL LOW (ref 135–145)

## 2022-10-11 LAB — CBC WITH DIFFERENTIAL/PLATELET
Abs Immature Granulocytes: 0.66 10*3/uL — ABNORMAL HIGH (ref 0.00–0.07)
Basophils Absolute: 0.1 10*3/uL (ref 0.0–0.1)
Basophils Relative: 0 %
Eosinophils Absolute: 0 10*3/uL (ref 0.0–0.5)
Eosinophils Relative: 0 %
HCT: 31.9 % — ABNORMAL LOW (ref 36.0–46.0)
Hemoglobin: 10.3 g/dL — ABNORMAL LOW (ref 12.0–15.0)
Immature Granulocytes: 3 %
Lymphocytes Relative: 3 %
Lymphs Abs: 0.7 10*3/uL (ref 0.7–4.0)
MCH: 26.3 pg (ref 26.0–34.0)
MCHC: 32.3 g/dL (ref 30.0–36.0)
MCV: 81.4 fL (ref 80.0–100.0)
Monocytes Absolute: 1.5 10*3/uL — ABNORMAL HIGH (ref 0.1–1.0)
Monocytes Relative: 6 %
Neutro Abs: 23.9 10*3/uL — ABNORMAL HIGH (ref 1.7–7.7)
Neutrophils Relative %: 88 %
Platelets: 411 10*3/uL — ABNORMAL HIGH (ref 150–400)
RBC: 3.92 MIL/uL (ref 3.87–5.11)
RDW: 14.3 % (ref 11.5–15.5)
Smear Review: INCREASED
WBC: 26.9 10*3/uL — ABNORMAL HIGH (ref 4.0–10.5)
nRBC: 0.1 % (ref 0.0–0.2)

## 2022-10-11 LAB — URINALYSIS, W/ REFLEX TO CULTURE (INFECTION SUSPECTED)
Bilirubin Urine: NEGATIVE
Glucose, UA: NEGATIVE mg/dL
Hgb urine dipstick: NEGATIVE
Ketones, ur: NEGATIVE mg/dL
Nitrite: NEGATIVE
Protein, ur: NEGATIVE mg/dL
Specific Gravity, Urine: 1.017 (ref 1.005–1.030)
pH: 5 (ref 5.0–8.0)

## 2022-10-11 LAB — PREGNANCY, URINE: Preg Test, Ur: NEGATIVE

## 2022-10-11 LAB — SODIUM, URINE, RANDOM: Sodium, Ur: 23 mmol/L

## 2022-10-11 LAB — CREATININE, URINE, RANDOM: Creatinine, Urine: 192 mg/dL

## 2022-10-11 LAB — MAGNESIUM: Magnesium: 1.8 mg/dL (ref 1.7–2.4)

## 2022-10-11 LAB — STREP PNEUMONIAE URINARY ANTIGEN: Strep Pneumo Urinary Antigen: NEGATIVE

## 2022-10-11 LAB — EPSTEIN BARR VRS(EBV DNA BY PCR): EBV DNA QN by PCR: NEGATIVE IU/mL

## 2022-10-11 MED ORDER — GUAIFENESIN 100 MG/5ML PO LIQD
5.0000 mL | ORAL | Status: DC | PRN
  Administered 2022-10-11 – 2022-10-13 (×3): 5 mL via ORAL
  Filled 2022-10-11 (×2): qty 5
  Filled 2022-10-11: qty 15

## 2022-10-11 MED ORDER — INSULIN ASPART 100 UNIT/ML IJ SOLN
0.0000 [IU] | Freq: Three times a day (TID) | INTRAMUSCULAR | Status: DC
  Administered 2022-10-11 – 2022-10-12 (×3): 9 [IU] via SUBCUTANEOUS
  Administered 2022-10-12: 7 [IU] via SUBCUTANEOUS

## 2022-10-11 MED ORDER — SODIUM CHLORIDE 0.9 % IV SOLN
2.0000 g | Freq: Two times a day (BID) | INTRAVENOUS | Status: DC
  Administered 2022-10-11 – 2022-10-12 (×3): 2 g via INTRAVENOUS
  Filled 2022-10-11 (×3): qty 12.5

## 2022-10-11 MED ORDER — FUROSEMIDE 10 MG/ML IJ SOLN: 40.0000 mg | Freq: Once | INTRAMUSCULAR | Status: DC

## 2022-10-11 MED ORDER — FUROSEMIDE 10 MG/ML IJ SOLN
40.0000 mg | Freq: Once | INTRAMUSCULAR | Status: AC
  Administered 2022-10-11: 40 mg via INTRAVENOUS
  Filled 2022-10-11: qty 4

## 2022-10-11 NOTE — Progress Notes (Addendum)
[facility] Progress Note   Subjective:   abd feels bloated = specifically RLQ where heparin injections done and some small ecchymosis there; DOE and orthopnea noted. +3L by I/Os for admission  Objective Vitals:   10/10/22 1615 10/10/22 2154 10/11/22 0345 10/11/22 0909  BP: (!) 145/72 134/70 (!) 144/70 129/72  Pulse: 92 97 96 98  Resp: 18 18 18 20   Temp: 97.7 F (36.5 C) 98.6 F (37 C) (!) 97.5 F (36.4 C) 98.6 F (37 C)  TempSrc: Oral  Oral   SpO2: 92% (!) 89% 94% (!) 81%  Weight:      Height:       Physical Exam General appearance: fatigued and no distress Head: Normocephalic, without obvious abnormality, atraumatic Resp: rales bilaterally, inc WOB moving around and talking Cardio: regular rate and rhythm, S1, S2 normal, no murmur, click, rub or gallop GI: soft, non-tender; bowel sounds normal; no masses,  no organomegaly and renal allograft in RLQ, no tenderness or bruits. Extremities: edema trace pretibial edema bilaterally  Additional Objective Labs: Basic Metabolic Panel: Recent Labs  Lab 10/10/22 0216 10/10/22 0636 10/11/22 0857  NA 136 134* 133*  K 2.9* 3.2* 4.4  CL 97* 99 99  CO2 19* 21* 18*  GLUCOSE 107* 96 236*  BUN 67* 67* 72*  CREATININE 4.24* 4.06* 2.21*  CALCIUM 7.0* 7.0* 7.5*  PHOS  --   --  4.3   Liver Function Tests: Recent Labs  Lab 10/10/22 0216 10/11/22 0857  AST 13*  --   ALT 17  --   ALKPHOS 180*  --   BILITOT 0.6  --   PROT 6.8  --   ALBUMIN 2.6* 2.6*   No results for input(s): "LIPASE", "AMYLASE" in the last 168 hours. CBC: Recent Labs  Lab 10/09/22 2009 10/10/22 0216 10/11/22 0857  WBC 16.9* 16.9* 26.9*  NEUTROABS 13.5*  --  PENDING  HGB 9.9* 9.6* 10.3*  HCT 29.8* 29.3* 31.9*  MCV 77.0* 77.3* 81.4  PLT 365 351 411*   Blood Culture    Component Value Date/Time   SDES EXPECTORATED SPUTUM 10/10/2022 0630   SDES EXPECTORATED SPUTUM 10/10/2022 0630   SPECREQUEST Immunocompromised 10/10/2022 0630    SPECREQUEST Immunocompromised Reflexed from [id number] 10/10/2022 0630   CULT  10/10/2022 0630    CULTURE REINCUBATED FOR BETTER GROWTH Performed at [facility], [address], [place], [place] [address]    REPTSTATUS 10/10/2022 FINAL 10/10/2022 0630   REPTSTATUS PENDING 10/10/2022 0630    Cardiac Enzymes: No results for input(s): "CKTOTAL", "CKMB", "CKMBINDEX", "TROPONINI" in the last 168 hours. CBG: Recent Labs  Lab 10/10/22 1147 10/10/22 1613 10/10/22 2207 10/11/22 0135 10/11/22 0724  GLUCAP 145* 168* 161* 166* 186*   Iron Studies:  Recent Labs    10/10/22 0216  IRON 14*  TIBC 186*   @lablastinr3 @ Studies/Results: US Renal Transplant w/Doppler  Result Date: 10/10/2022 CLINICAL DATA:  Acute kidney injury.  Status post renal transplant. EXAM: ULTRASOUND OF RENAL TRANSPLANT WITH RENAL DOPPLER ULTRASOUND TECHNIQUE: Ultrasound examination of the renal transplant was performed with gray-scale, color and duplex doppler evaluation. COMPARISON:  Renal transplant ultrasound 08/28/2022 and 11/06/2021. FINDINGS: Transplant kidney location: Right lower quadrant Transplant Kidney: Renal measurements: 11.6 x 7.5 x 7.3 cm = volume: . Normal in size and parenchymal echogenicity. No evidence of mass or hydronephrosis. No peri-transplant fluid collection seen. Color flow in the main renal artery:  Yes Color flow in the main renal vein:  Yes Duplex Doppler Evaluation: Main  Renal Artery Velocity: 159 cm/sec Main Renal Artery Resistive Index: 0.8 Venous waveform in main renal vein:  Present Intrarenal resistive index in upper pole:  0.7 (normal 0.6-0.8; equivocal 0.8-0.9; abnormal >= 0.9) Intrarenal resistive index in lower pole: 0.6 (normal 0.6-0.8; equivocal 0.8-0.9; abnormal >= 0.9) Bladder: Normal for degree of bladder distention. Other findings:  None. IMPRESSION: Normal sonographic appearance of the transplant kidney. Electronically Signed   By: Darliss Cheney M.D.   On: 10/10/2022 02:27    DG Chest 2 View  Result Date: 10/09/2022 CLINICAL DATA:  Chest pain and shortness of breath EXAM: CHEST - 2 VIEW COMPARISON:  Chest x-ray 08/27/2018 FINDINGS: There is airspace disease in the inferior right upper lobe and right lower lobe. There is a small right pleural effusion. Cardiomediastinal silhouette is within normal limits. No acute fractures are seen. IMPRESSION: 1. Airspace disease in the inferior right upper lobe and right lower lobe, concerning for multifocal pneumonia. Follow-up imaging recommended in 4-6 weeks to confirm resolution. 2. Small right pleural effusion. Electronically Signed   By: Darliss Cheney M.D.   On: 10/09/2022 19:14   Medications:  sodium chloride 10 mL/hr at 10/09/22 2209   ceFEPime (MAXIPIME) IV 1 g (10/10/22 2336)    ARIPiprazole  30 mg Oral Daily   atorvastatin  10 mg Oral Daily   buPROPion  450 mg Oral Daily   cinacalcet  30 mg Oral Q breakfast   doxycycline  100 mg Oral Q12H   folic acid  1 mg Oral Daily   guaiFENesin  1,200 mg Oral BID   heparin  5,000 Units Subcutaneous Q8H   hydrOXYzine  25 mg Oral QHS   insulin pump   Subcutaneous TID WC, HS, 0200   loratadine  10 mg Oral Daily   montelukast  10 mg Oral QHS   multivitamins with iron  1 tablet Oral Daily   mycophenolate  360 mg Oral BID   pantoprazole  40 mg Oral Daily   predniSONE  40 mg Oral Q breakfast   sodium bicarbonate  1,300 mg Oral BID   tacrolimus  4 mg Oral QHS   tacrolimus  5 mg Oral Daily    Assessment/Plan:  AKI/CKD stage IIIa following DDKT- presumably due to ischemic ATN in setting of poor po intake and diarrhea for the past 5-6 days.  Scr has improved with IVF's supporting prerenal etiology.  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  Cont daily labs and  max supportive care.   Avoid nephrotoxic medications including NSAIDs and iodinated intravenous contrast exposure unless the latter is absolutely indicated.  Preferred narcotic agents for pain control are  hydromorphone, fentanyl, and methadone. Morphine should not be used. Avoid Baclofen and avoid oral sodium phosphate and magnesium citrate based laxatives / bowel preps. Continue strict Input and Output monitoring. Will monitor the patient closely with you and intervene or adjust therapy as indicated by changes in clinical status/labs  Multifocal pneumonia - antibiotics per primary svc.  Dose for eGFR <20 Immunosuppressive therapy - normally on myfortic 720 mg bid, prograf 5 mg qam and 4 mg qpm, and prednisone 5 mg.  Agree with decreasing myfortic to 360 mg bid.  Tac level pending 7/7 at 21:50 - appears to have been appropriately collected.  AGMA - due to #1 and diarrhea.  S/p LR and on po bicarb now - acceptable Hypokalemia - normalized Hypomagnesemia - improved s/p supplement Acute hypoxic respiratory failure with acute asthma exacerbation  + pneumonia - supplemental oxygen  as needed.  Prednisone increased to 40 mg daily per primary svc. CXR 7/6 with possible infection - rechecking CXR today, if pulm edema will give IV last.   CAD - stable DM type 2 - per primary svc Anemia of CKD stage III - continue to follow and transfuse for Hgb <7  Remain inpatient for now.  Will follow closely.   Estill Bakes MD 10/11/2022, 11:31 AM  [facility] Pager: [phone]  ADDENDUM:  CXR reviewed - BL patchy opacities ~ same as prior, symptoms seem worse and she's net + 3L with some mild edema, will give one time lasix 40 IV given worsening dyspnea in case pulmonary edema is contributing.

## 2022-10-11 NOTE — TOC CM/SW Note (Signed)
Transition of Care [facility]) - Inpatient Brief Assessment   Patient Details  Name: Linda Ellison MRN: [id number] Date of Birth: [birth year]  Transition of Care [facility]) CM/SW Contact:    Tom-Johnson, Hershal Coria, RN Phone Number: 10/11/2022, 3:48 PM   Clinical Narrative:  Patient presented  to [facility] with C/O Generalized Weakness, Fatigue, Cough and SOB. Transferred to [facility].  Found to have Multifocal Pneumonia. Currently on IV abt. Patient has hx of Kidney transplant and states she is compliant with her treatments.   From home with Fiance and two children. Currently on disability. Independent with care and drive self. Does not have home DME's.   No TOC needs or recommendations noted at this time. CM will continue to follow as patient progresses with care towards discharge.    Transition of Care Asessment: Insurance and Status: Insurance coverage has been reviewed Patient has primary care physician: Yes Home environment has been reviewed: Yes Prior level of function:: Independent Prior/Current Home Services: No current home services Social Determinants of Health Reivew: SDOH reviewed no interventions necessary Readmission risk has been reviewed: Yes Transition of care needs: no transition of care needs at this time

## 2022-10-11 NOTE — Inpatient Diabetes Management (Addendum)
Inpatient Diabetes Program Recommendations  AACE/ADA: New Consensus Statement on Inpatient Glycemic Control (2015)  Target Ranges:  Prepandial:   less than 140 mg/dL      Peak postprandial:   less than 180 mg/dL (1-2 hours)      Critically ill patients:  140 - 180 mg/dL   Lab Results  Component Value Date   GLUCAP 270 (H) 10/11/2022   HGBA1C 6.7 (H) 08/27/2022    Latest Reference Range & Units 10/11/22 07:24 10/11/22 11:31  Glucose-Capillary 70 - 99 mg/dL 161 (H) 096 (H)  (H): Data is abnormally high  History: DM, Kidney Transplant   Home DM Meds: Insulin Pump                              Trulicity 4.5 mg Qweek   Current Orders: Insulin Pump Q4 hours  Inpatient Diabetes Program Recommendations:    DM coordinator spoke with patient regarding pump settings on 08/27/22 on prior admission. Patient sees Dr. Allena Katz with [facility] for endocrinology with last phone call 09/17/22 and office visit 03/10/22.  2:00 pm Spoke with patient @ bedside. Patient has insulin pump on but states her pump battery is dead and no charger with her. Site was changed Saturday so 2 days ago. Significant other states he will go and get her insulin pump charger and supplies and will return in 2 hrs. LPN Stacie Glaze made aware of insulin pump not working and is checking with Dr. Janee Morn about Novolog insulin correction until insulin pump supplies and charger arrives. If not restarted on insulin pump, will need to convert to subcutaneous insulin.   Thank you, Billy Fischer. Mikalia Fessel, RN, MSN, CDE  Diabetes Coordinator Inpatient Glycemic Control Team Team Pager [phone] (8am-5pm) 10/11/2022 12:28 PM

## 2022-10-11 NOTE — Progress Notes (Signed)
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  Patient noted to have been recently admitted 08/26/2022-08/28/2022 in the setting of norovirus diarrhea, hypokalemia and hypomagnesemia.  Patient endorsed compliance with immunosuppressive medications at home.  Patient seen in the ED noted to have desatted to 80% on room air which improved with nasal cannula, lab work done concerning for hypokalemia and hypomagnesemia.  CBC with a leukocytosis.  Patient also noted to be in acute on chronic renal failure with a creatinine of 5.05 on presentation and baseline approximately 1.5.  Chest x-ray done concerning for multifocal pneumonia.  Patient placed empirically on IV antibiotics.  Patient admitted for further evaluation and management.   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)   History of CAD (coronary artery disease)   CAD (coronary artery disease)   DM (diabetes mellitus) type II controlled with renal manifestation (HCC)   Gastroparesis due to DM (HCC)   Chronic  anemia   Metabolic acidosis   Essential hypertension   Peripheral neuropathy   Generalized anxiety disorder   Hypocalcemia   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 -Per admitting physician patient noted to have mold in the house for at least the past 6 months. -Respiratory viral panel negative. -SARS coronavirus 2 PCR negative, influenza A and B PCR negative. -Patient states no significant clinical improvement since admission. -Sputum Gram stain and culture preliminary results with moderate gram-positive cocci, rare gram-positive rods, rare gram-negative rods.  Blood cultures pending. -MRSA PCR negative. -Urine strep pneumococcus antigen negative.  Urine Legionella antigen pending. -LDH within normal limits at 168. -Repeat chest x-ray shows worsening bilateral infiltrates. -Lasix 40 mg IV x 1. -Check a BNP. -Check a CT chest without contrast. -Continue empiric antibiotics of doxycycline, cefepime. -Continue scheduled DuoNebs, prednisone, PPI, Mucinex, Claritin.. -Discussed with PCCM who will formally consult on patient in the AM. -Supportive care.  2.  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 -Creatinine on presentation noted at 5.05 with baseline creatinine  1.3-1.8. -Patient noted on admission to have borderline hypotension. -Acute kidney injury  likely secondary to a prerenal azotemia in the setting of low blood pressure, GI losses, poor oral intake. -Transplant ultrasound negative for hydronephrosis. -Renal function slowly improving with hydration creatinine currently at 2.21 from 4.06 from 5.05 on admission. -Nephrology consulted and feel no indication for dialysis at this time and to monitor closely with hydration. -Saline lock IV fluids. -Avoid nephrotoxins. -Nephrology recommended decreasing patient's Myfortic to 360 mg twice daily, continuation of Prograf 5 mg every morning and 4 mg every afternoon.  Patient on steroids due to problem #1. -Per nephrology.  3.  Hypomagnesemia/hypokalemia -Likely secondary to GI losses. -Magnesium currently at 1.8 today from 1.4.   -Potassium of 4.4.   -Repeat labs in the AM.    4. insulin-dependent diabetes mellitus type 1 -Hemoglobin A1c 6.7. -Patient noted not to have batteries on the insulin pump today. -Placed on sliding scale insulin, check CBGs before meals and at bedtime. -May need to start patient on long-acting insulin if insulin pump cannot be resumed.  5.  History of nonobstructive CAD -Patient not on any antiplatelet therapy. -Continue statin.  6.  Anemia of chronic disease -Follow H&H.  7.  Anion gap metabolic acidosis -Likely secondary to acute kidney injury.  Patient also noted to have had some recent diarrhea. -Stable.  8.  Hypertension -Home antihypertensive medications held due to soft blood pressure, BP improved.   -Patient to receive Lasix 40 mg IV x 1 and as such we will monitor blood pressure for now.    9.  Peripheral neuropathy -Gabapentin on hold.  10.  General anxiety disorder/insomnia -Continue home regimen Abilify and Wellbutrin.   -Continue to hold Celexa.   11.  Chronic hypocalcemia -Continue Cinacalcet. -   DVT prophylaxis: Heparin Code Status:  Full Family Communication: Updated patient and fianc at bedside.   Disposition: Likely home when clinically improved and renal function back to baseline  Status is: Inpatient The patient will require care spanning > 2 midnights and should be moved to inpatient because: Severity of illness   Consultants:  Nephrology: Dr. Arrie Aran 10/10/2022  Procedures:  -Chest x-ray 10/09/2022, 10/11/2022 Transplant ultrasound 10/10/2022    Antimicrobials:  Anti-infectives (From admission, onward)    Start     Dose/Rate Route Frequency Ordered Stop   10/11/22 1230  ceFEPIme (MAXIPIME) 2 g in sodium chloride 0.9 % 100 mL IVPB        2 g 200 mL/hr over 30 Minutes Intravenous Every 12 hours 10/11/22 1158     10/11/22 1000  doxycycline (VIBRA-TABS) tablet 100 mg  Status:  Discontinued        100 mg Oral Daily 10/10/22 0117 10/10/22 0126   10/11/22 0000  ceFEPIme (MAXIPIME) 2 g in sodium chloride 0.9 % 100 mL IVPB  Status:  Discontinued        2 g 200 mL/hr over 30 Minutes Intravenous Every 24 hours 10/10/22 0122 10/10/22 0123   10/11/22 0000  ceFEPIme (MAXIPIME) 1 g in sodium chloride 0.9 % 100 mL IVPB  Status:  Discontinued        1 g 200 mL/hr over 30 Minutes Intravenous Every 24 hours 10/10/22 0123 10/11/22 1158   10/10/22 0215  doxycycline (VIBRA-TABS) tablet 100 mg        100 mg Oral Every 12 hours 10/10/22 0126 10/16/22 2159   10/09/22 2315  ceFEPIme (MAXIPIME) 2 g in sodium chloride 0.9 % 100 mL IVPB        2 g 200 mL/hr over 30 Minutes Intravenous  Once 10/09/22 2303 10/10/22 0135   10/09/22 2130  doxycycline (VIBRAMYCIN) 100 mg in sodium chloride 0.9 % 250 mL IVPB        100 mg 125 mL/hr over 120 Minutes Intravenous  Once 10/09/22 2115 10/10/22 0011   10/09/22 2053  ceFEPIme (MAXIPIME) 1 g in sodium chloride 0.9 % 100 mL IVPB  Status:  Discontinued        1 g 200 mL/hr over 30 Minutes Intravenous Every 24 hours 10/09/22 2054 10/09/22 2303   10/09/22 2045  ceFEPIme (MAXIPIME) 2 g in sodium  chloride 0.9 % 100 mL IVPB  Status:  Discontinued        2 g 200 mL/hr over 30 Minutes Intravenous Every 24 hours 10/09/22 2040 10/09/22 2054         Subjective: Laying in bed.  States no significant change with her shortness of breath or cough.  Fianc at bedside.    Objective: Vitals:   10/10/22 1615 10/10/22 2154 10/11/22 0345 10/11/22 0909  BP: (!) 145/72 134/70 (!) 144/70 129/72  Pulse: 92 97 96 98  Resp: 18 18 18 20   Temp: 97.7 F (36.5 C) 98.6 F (37 C) (!) 97.5 F (36.4 C) 98.6 F (37 C)  TempSrc: Oral  Oral   SpO2: 92% (!) 89% 94% (!) 81%  Weight:      Height:        Intake/Output Summary (Last 24 hours) at 10/11/2022 1216 Last data filed at 10/10/2022 1700 Gross per 24 hour  Intake 1324.11 ml  Output 0 ml  Net 1324.11 ml    Filed Weights   10/09/22 1844 10/10/22 0606  Weight: 114.3 kg 119 kg    Examination:  General exam: Appears calm and comfortable  Respiratory system: Diffuse coarse BS R > L. Minimal wheezing,   Cardiovascular system: RRR no murmurs rubs or gallops.  Trace to 1+ bilateral lower extremity edema.  Gastrointestinal system: Abdomen is soft, nontender, nondistended, positive bowel sounds.  No rebound.  No guarding.   Central nervous system: Alert and oriented. No focal neurological deficits. Extremities: Symmetric 5 x 5 power. Skin: No rashes, lesions or ulcers Psychiatry: Judgement and insight appear normal. Mood & affect appropriate.     Data Reviewed: I have personally reviewed following labs and imaging studies  CBC: Recent Labs  Lab 10/09/22 2009 10/10/22 0216 10/11/22 0857  WBC 16.9* 16.9* 26.9*  NEUTROABS 13.5*  --  PENDING  HGB 9.9* 9.6* 10.3*  HCT 29.8* 29.3* 31.9*  MCV 77.0* 77.3* 81.4  PLT 365 351 411*     Basic Metabolic Panel: Recent Labs  Lab 10/09/22 2009 10/10/22 0216 10/10/22 0636 10/11/22 0857  NA 131* 136 134* 133*  K 2.5* 2.9* 3.2* 4.4  CL 94* 97* 99 99  CO2 20* 19* 21* 18*  GLUCOSE 138* 107* 96  236*  BUN 71* 67* 67* 72*  CREATININE 5.05* 4.24* 4.06* 2.21*  CALCIUM 6.8* 7.0* 7.0* 7.5*  MG 1.5*  --  1.4* 1.8  PHOS  --   --   --  4.3     GFR: Estimated Creatinine Clearance: 42.1 mL/min (A) (by C-G formula based on SCr of 2.21 mg/dL (H)).  Liver Function Tests: Recent Labs  Lab 10/10/22 0216 10/11/22 0857  AST 13*  --   ALT 17  --   ALKPHOS 180*  --   BILITOT 0.6  --   PROT 6.8  --   ALBUMIN 2.6* 2.6*     CBG: Recent Labs  Lab 10/10/22 1613 10/10/22 2207 10/11/22 0135 10/11/22 0724 10/11/22 1131  GLUCAP 168* 161* 166* 186* 270*      Recent Results (from the past 240 hour(s))  Resp panel by RT-PCR (RSV, Flu A&B, Covid) Anterior Nasal Swab     Status: None   Collection Time: 10/09/22  8:21 PM   Specimen: Anterior Nasal Swab  Result Value Ref Range Status   SARS Coronavirus 2 by RT PCR NEGATIVE NEGATIVE Final    Comment: (NOTE) SARS-CoV-2 target nucleic acids are NOT DETECTED.  The SARS-CoV-2 RNA is generally detectable in upper respiratory specimens during the acute phase of infection. The lowest concentration of SARS-CoV-2 viral copies this assay can detect is 138 copies/mL. A negative result does not preclude SARS-Cov-2 infection and should not be used as the sole basis for treatment or other patient management decisions. A negative result may occur with  improper specimen collection/handling, submission of specimen other than nasopharyngeal swab, presence of viral mutation(s) within the areas targeted by this assay, and inadequate number of viral copies(<138 copies/mL). A negative result must be combined with clinical observations, patient history, and epidemiological information. The expected result is Negative.  Fact Sheet for Patients:  BloggerCourse.com  Fact Sheet for Healthcare Providers:  SeriousBroker.it  This test is no t yet approved or cleared by the [place] FDA and  has been  authorized for detection and/or diagnosis of SARS-CoV-2 by FDA under an Emergency Use Authorization (EUA). This EUA will remain  in effect (meaning this test can be used) for the duration of the COVID-19 declaration under Section 564(b)(1) of the Act, 21 U.S.C.section 360bbb-3(b)(1), unless the authorization is terminated  or revoked sooner.       Influenza A by PCR NEGATIVE NEGATIVE Final   Influenza B by PCR NEGATIVE NEGATIVE Final    Comment: (NOTE) The Xpert Xpress SARS-CoV-2/FLU/RSV plus assay is intended as an aid in the diagnosis of influenza from Nasopharyngeal swab specimens and should not be used as a sole basis for treatment. Nasal washings and aspirates are unacceptable for Xpert Xpress SARS-CoV-2/FLU/RSV testing.  Fact Sheet for Patients: BloggerCourse.com  Fact Sheet for Healthcare Providers: SeriousBroker.it  This test is not yet approved or cleared by the [place] FDA and has been authorized for detection and/or diagnosis of SARS-CoV-2 by FDA under an Emergency Use Authorization (EUA). This EUA will remain in effect (meaning this test can be used) for the duration of the COVID-19 declaration under Section 564(b)(1) of the Act, 21 U.S.C. section 360bbb-3(b)(1), unless the authorization is terminated or revoked.     Resp Syncytial Virus by PCR NEGATIVE NEGATIVE Final    Comment: (NOTE) Fact Sheet for Patients: BloggerCourse.com  Fact Sheet for Healthcare Providers: SeriousBroker.it  This test is not yet approved or cleared by the [place] FDA and has been authorized for detection and/or diagnosis of SARS-CoV-2 by FDA under an Emergency Use Authorization (EUA). This EUA will remain in effect (meaning this test can be used) for the duration of the COVID-19 declaration under Section 564(b)(1) of the Act, 21 U.S.C. section 360bbb-3(b)(1), unless the  authorization is terminated or revoked.  Performed at [facility], [address] [place], [place] [address]   Blood culture (routine x 2)     Status: None (Preliminary result)   Collection Time: 10/09/22  8:50 PM   Specimen: BLOOD LEFT HAND  Result Value Ref Range Status   Specimen Description   Final    BLOOD LEFT HAND Performed  at [facility], [address] [place], [place] [address]    Special Requests   Final    BOTTLES DRAWN AEROBIC AND ANAEROBIC Blood Culture adequate volume Performed at [facility], [address] [place], [place] [address]    Culture   Final    NO GROWTH 1 DAY Performed at [facility], [address], [place], [place] [address]    Report Status PENDING  Incomplete  Blood culture (routine x 2)     Status: None (Preliminary result)   Collection Time: 10/09/22  9:33 PM   Specimen: BLOOD RIGHT HAND  Result Value Ref Range Status   Specimen Description   Final    BLOOD RIGHT HAND Performed at [facility], [address] [place], [place] [address]    Special Requests   Final    BOTTLES DRAWN AEROBIC AND ANAEROBIC Blood Culture adequate volume Performed at [facility], [address] [place], [place] [address]    Culture   Final    NO GROWTH 1 DAY Performed at [facility], [address] [place], [place] [address]    Report Status PENDING  Incomplete  Respiratory (~20 pathogens) panel by PCR     Status: None   Collection Time: 10/10/22  3:10 AM   Specimen: Nasopharyngeal Swab; Respiratory  Result Value Ref Range Status   Adenovirus NOT DETECTED NOT DETECTED Final   Coronavirus 229E NOT DETECTED NOT DETECTED Final    Comment: (NOTE) The Coronavirus on the Respiratory Panel, DOES NOT test for the novel  Coronavirus (2019 nCoV)    Coronavirus HKU1 NOT DETECTED NOT DETECTED Final   Coronavirus NL63 NOT DETECTED NOT DETECTED Final   Coronavirus OC43 NOT DETECTED NOT DETECTED Final    Metapneumovirus NOT DETECTED NOT DETECTED Final   Rhinovirus / Enterovirus NOT DETECTED NOT DETECTED Final   Influenza A NOT DETECTED NOT DETECTED Final   Influenza B NOT DETECTED NOT DETECTED Final   Parainfluenza Virus 1 NOT DETECTED NOT DETECTED Final   Parainfluenza Virus 2 NOT DETECTED NOT DETECTED Final   Parainfluenza Virus 3 NOT DETECTED NOT DETECTED Final   Parainfluenza Virus 4 NOT DETECTED NOT DETECTED Final   Respiratory Syncytial Virus NOT DETECTED NOT DETECTED Final   Bordetella pertussis NOT DETECTED NOT DETECTED Final   Bordetella Parapertussis NOT DETECTED NOT DETECTED Final   Chlamydophila pneumoniae NOT DETECTED NOT DETECTED Final   Mycoplasma pneumoniae NOT DETECTED NOT DETECTED Final    Comment: Performed at [facility], [address], [place], [place] [address]  Expectorated Sputum Assessment w Gram Stain, Rflx to Resp Cult     Status: None   Collection Time: 10/10/22  6:30 AM   Specimen: Expectorated Sputum  Result Value Ref Range Status   Specimen Description EXPECTORATED SPUTUM  Final   Special Requests Immunocompromised  Final   Sputum evaluation   Final    THIS SPECIMEN IS ACCEPTABLE FOR SPUTUM CULTURE Performed at [facility], [address] [place], [place] [address]    Report Status 10/10/2022 FINAL  Final  Culture, Respiratory w Gram Stain     Status: None (Preliminary result)   Collection Time: 10/10/22  6:30 AM  Result Value Ref Range Status   Specimen Description EXPECTORATED SPUTUM  Final   Special Requests Immunocompromised Reflexed from [id number]  Final   Gram Stain   Final    MODERATE WBC SEEN FEW SQUAMOUS  EPITHELIAL CELLS PRESENT MODERATE GRAM POSITIVE COCCI RARE GRAM POSITIVE RODS RARE GRAM NEGATIVE RODS    Culture   Final    CULTURE REINCUBATED FOR BETTER GROWTH Performed at [facility], [address] [place], [place] [address]    Report Status PENDING  Incomplete  MRSA Next Gen by PCR, Nasal     Status: None    Collection Time: 10/10/22  5:18 PM   Specimen: Nasal Mucosa; Nasal Swab  Result Value Ref Range Status   MRSA by PCR Next Gen NOT DETECTED NOT DETECTED Final    Comment: (NOTE) The GeneXpert MRSA Assay (FDA approved for NASAL specimens only), is one component of a comprehensive MRSA colonization surveillance program. It is not intended to diagnose MRSA infection nor to guide or monitor treatment for MRSA infections. Test performance is not FDA approved in patients less than 74 years old. Performed at [facility], [address], [place], [place] [address]          Radiology Studies: US Renal Transplant w/Doppler  Result Date: 10/10/2022 CLINICAL DATA:  Acute kidney injury.  Status post renal transplant. EXAM: ULTRASOUND OF RENAL TRANSPLANT WITH RENAL DOPPLER ULTRASOUND TECHNIQUE: Ultrasound examination of the renal transplant was performed with gray-scale, color and duplex doppler evaluation. COMPARISON:  Renal transplant ultrasound 08/28/2022 and 11/06/2021. FINDINGS: Transplant kidney location: Right lower quadrant Transplant Kidney: Renal measurements: 11.6 x 7.5 x 7.3 cm = volume: . Normal in size and parenchymal echogenicity. No evidence of mass or hydronephrosis. No peri-transplant fluid collection seen. Color flow in the main renal artery:  Yes Color flow in the main renal vein:  Yes Duplex Doppler Evaluation: Main Renal Artery Velocity: 159 cm/sec Main Renal Artery Resistive Index: 0.8 Venous waveform in main renal vein:  Present Intrarenal resistive index in upper pole:  0.7 (normal 0.6-0.8; equivocal 0.8-0.9; abnormal >= 0.9) Intrarenal resistive index in lower pole: 0.6 (normal 0.6-0.8; equivocal 0.8-0.9; abnormal >= 0.9) Bladder: Normal for degree of bladder distention. Other findings:  None. IMPRESSION: Normal sonographic appearance of the transplant kidney. Electronically Signed   By: Darliss Cheney M.D.   On: 10/10/2022 02:27   DG Chest 2 View  Result Date:  10/09/2022 CLINICAL DATA:  Chest pain and shortness of breath EXAM: CHEST - 2 VIEW COMPARISON:  Chest x-ray 08/27/2018 FINDINGS: There is airspace disease in the inferior right upper lobe and right lower lobe. There is a small right pleural effusion. Cardiomediastinal silhouette is within normal limits. No acute fractures are seen. IMPRESSION: 1. Airspace disease in the inferior right upper lobe and right lower lobe, concerning for multifocal pneumonia. Follow-up imaging recommended in 4-6 weeks to confirm resolution. 2. Small right pleural effusion. Electronically Signed   By: Darliss Cheney M.D.   On: 10/09/2022 19:14        Scheduled Meds:  ARIPiprazole  30 mg Oral Daily   atorvastatin  10 mg Oral Daily   buPROPion  450 mg Oral Daily   cinacalcet  30 mg Oral Q breakfast   doxycycline  100 mg Oral Q12H   folic acid  1 mg Oral Daily   guaiFENesin  1,200 mg Oral BID   heparin  5,000 Units Subcutaneous Q8H   hydrOXYzine  25 mg Oral QHS   insulin pump   Subcutaneous TID WC, HS, 0200   loratadine  10 mg Oral Daily   montelukast  10 mg Oral QHS   multivitamins with iron  1 tablet Oral Daily   mycophenolate  360 mg Oral BID   pantoprazole  40 mg Oral Daily   predniSONE  40 mg Oral Q breakfast   sodium bicarbonate  1,300 mg Oral BID   tacrolimus  4 mg Oral QHS   tacrolimus  5 mg Oral Daily   Continuous Infusions:  sodium chloride 10 mL/hr at 10/09/22 2209   ceFEPime (MAXIPIME) IV       LOS: 1 day    Time spent: 40 minutes    Ramiro Harvest, MD [facility]   To contact the attending provider between 7A-7P or the covering provider during after hours 7P-7A, please log into the web site [email or web address] and access using universal [place] password for that web site. If you do not have the password, please call the hospital operator.  10/11/2022, 12:16 PM

## 2022-10-11 NOTE — Progress Notes (Signed)
Pharmacy Antibiotic Note  Linda Ellison is a 47 y.o. female for which pharmacy has been consulted for cefepime dosing for pneumonia.  AKI on CKD 3a s/p renal transplant with improved renal function. Afebrile, WBC up 26.9 (on steroids), and SCr down to 2.21 (baseline ~1.3-1.8)  Plan: Change cefepime to 2 g IV q12h Monitor WBC, fever, renal function, cultures De-escalate when able  Height: 5' 6.5" (168.9 cm) Weight: 119 kg (262 lb 5.6 oz) IBW/kg (Calculated) : 60.45  Temp (24hrs), Avg:98.1 F (36.7 C), Min:97.5 F (36.4 C), Max:98.6 F (37 C)  Recent Labs  Lab 10/09/22 2009 10/10/22 0216 10/10/22 0636 10/11/22 0857  WBC 16.9* 16.9*  --  26.9*  CREATININE 5.05* 4.24* 4.06* 2.21*     Estimated Creatinine Clearance: 42.1 mL/min (A) (by C-G formula based on SCr of 2.21 mg/dL (H)).    Allergies  Allergen Reactions   Ciprofloxacin Hives, Itching and Nausea And Vomiting   Fluocinolone Itching, Nausea And Vomiting and Other (See Comments)   Hydromorphone Hives   Strawberry Extract Itching and Other (See Comments)    Pt states that this medication makes her tongue raw.   No reaction   Hydromorphone Hcl Hives   Phenytoin Sodium Extended Itching   Adhesive [Tape] Itching and Rash   Chlorhexidine Gluconate Itching   Clindamycin Diarrhea, Nausea And Vomiting and Rash   Shrimp [Shellfish Allergy] Rash    Microbiology results: 7/6 BCx: ngtd 7/7 Sputum: reinc 7/7 Resp PCR: negative 7/7 MRSA PCR neg  Thank you for involving pharmacy in this patient's care.  Loura Back, PharmD, BCPS Clinical Pharmacist Clinical phone for 10/11/2022 is [phone] 10/11/2022 11:51 AM

## 2022-10-12 ENCOUNTER — Inpatient Hospital Stay (HOSPITAL_COMMUNITY): Payer: Medicare (Managed Care)

## 2022-10-12 DIAGNOSIS — I10 Essential (primary) hypertension: Secondary | ICD-10-CM | POA: Diagnosis not present

## 2022-10-12 DIAGNOSIS — J189 Pneumonia, unspecified organism: Secondary | ICD-10-CM | POA: Diagnosis not present

## 2022-10-12 DIAGNOSIS — E872 Acidosis, unspecified: Secondary | ICD-10-CM | POA: Diagnosis not present

## 2022-10-12 DIAGNOSIS — J9601 Acute respiratory failure with hypoxia: Secondary | ICD-10-CM | POA: Diagnosis not present

## 2022-10-12 DIAGNOSIS — J45901 Unspecified asthma with (acute) exacerbation: Secondary | ICD-10-CM | POA: Diagnosis not present

## 2022-10-12 DIAGNOSIS — R0609 Other forms of dyspnea: Secondary | ICD-10-CM | POA: Diagnosis not present

## 2022-10-12 LAB — BASIC METABOLIC PANEL
Anion gap: 16 — ABNORMAL HIGH (ref 5–15)
BUN: 88 mg/dL — ABNORMAL HIGH (ref 6–20)
CO2: 18 mmol/L — ABNORMAL LOW (ref 22–32)
Calcium: 7.7 mg/dL — ABNORMAL LOW (ref 8.9–10.3)
Chloride: 100 mmol/L (ref 98–111)
Creatinine, Ser: 2.24 mg/dL — ABNORMAL HIGH (ref 0.44–1.00)
GFR, Estimated: 27 mL/min — ABNORMAL LOW (ref >60)
Glucose, Bld: 332 mg/dL — ABNORMAL HIGH (ref 70–99)
Potassium: 5.7 mmol/L — ABNORMAL HIGH (ref 3.5–5.1)
Sodium: 134 mmol/L — ABNORMAL LOW (ref 135–145)

## 2022-10-12 LAB — RENAL FUNCTION PANEL
Albumin: 2.6 g/dL — ABNORMAL LOW (ref 3.5–5.0)
Anion gap: 18 — ABNORMAL HIGH (ref 5–15)
BUN: 87 mg/dL — ABNORMAL HIGH (ref 6–20)
CO2: 15 mmol/L — ABNORMAL LOW (ref 22–32)
Calcium: 7.8 mg/dL — ABNORMAL LOW (ref 8.9–10.3)
Chloride: 100 mmol/L (ref 98–111)
Creatinine, Ser: 2.2 mg/dL — ABNORMAL HIGH (ref 0.44–1.00)
GFR, Estimated: 27 mL/min — ABNORMAL LOW (ref >60)
Glucose, Bld: 365 mg/dL — ABNORMAL HIGH (ref 70–99)
Phosphorus: 4.8 mg/dL — ABNORMAL HIGH (ref 2.5–4.6)
Potassium: 6.9 mmol/L (ref 3.5–5.1)
Sodium: 133 mmol/L — ABNORMAL LOW (ref 135–145)

## 2022-10-12 LAB — GLUCOSE, CAPILLARY
Glucose-Capillary: 143 mg/dL — ABNORMAL HIGH (ref 70–99)
Glucose-Capillary: 151 mg/dL — ABNORMAL HIGH (ref 70–99)
Glucose-Capillary: 156 mg/dL — ABNORMAL HIGH (ref 70–99)
Glucose-Capillary: 164 mg/dL — ABNORMAL HIGH (ref 70–99)
Glucose-Capillary: 197 mg/dL — ABNORMAL HIGH (ref 70–99)
Glucose-Capillary: 237 mg/dL — ABNORMAL HIGH (ref 70–99)
Glucose-Capillary: 265 mg/dL — ABNORMAL HIGH (ref 70–99)
Glucose-Capillary: 304 mg/dL — ABNORMAL HIGH (ref 70–99)
Glucose-Capillary: 331 mg/dL — ABNORMAL HIGH (ref 70–99)
Glucose-Capillary: 349 mg/dL — ABNORMAL HIGH (ref 70–99)
Glucose-Capillary: 353 mg/dL — ABNORMAL HIGH (ref 70–99)
Glucose-Capillary: 358 mg/dL — ABNORMAL HIGH (ref 70–99)

## 2022-10-12 LAB — CBC WITH DIFFERENTIAL/PLATELET
Abs Immature Granulocytes: 0.9 10*3/uL — ABNORMAL HIGH (ref 0.00–0.07)
Basophils Absolute: 0.1 10*3/uL (ref 0.0–0.1)
Basophils Relative: 0 %
Eosinophils Absolute: 0 10*3/uL (ref 0.0–0.5)
Eosinophils Relative: 0 %
HCT: 32.5 % — ABNORMAL LOW (ref 36.0–46.0)
Hemoglobin: 10.5 g/dL — ABNORMAL LOW (ref 12.0–15.0)
Immature Granulocytes: 4 %
Lymphocytes Relative: 3 %
Lymphs Abs: 0.7 10*3/uL (ref 0.7–4.0)
MCH: 26.1 pg (ref 26.0–34.0)
MCHC: 32.3 g/dL (ref 30.0–36.0)
MCV: 80.8 fL (ref 80.0–100.0)
Monocytes Absolute: 1.2 10*3/uL — ABNORMAL HIGH (ref 0.1–1.0)
Monocytes Relative: 5 %
Neutro Abs: 22.5 10*3/uL — ABNORMAL HIGH (ref 1.7–7.7)
Neutrophils Relative %: 88 %
Platelets: 397 10*3/uL (ref 150–400)
RBC: 4.02 MIL/uL (ref 3.87–5.11)
RDW: 14.6 % (ref 11.5–15.5)
WBC: 25.3 10*3/uL — ABNORMAL HIGH (ref 4.0–10.5)
nRBC: 0.3 % — ABNORMAL HIGH (ref 0.0–0.2)

## 2022-10-12 LAB — ECHOCARDIOGRAM COMPLETE
Area-P 1/2: 4.21 cm2
Height: 66.5 in
S' Lateral: 2.7 cm
Weight: 4197.56 oz

## 2022-10-12 LAB — BK QUANT PCR (PLASMA/SERUM): BK Quantitaion PCR: NEGATIVE IU/mL

## 2022-10-12 MED ORDER — INSULIN ASPART 100 UNIT/ML IJ SOLN
2.0000 [IU] | INTRAMUSCULAR | Status: DC
  Administered 2022-10-13: 4 [IU] via SUBCUTANEOUS
  Administered 2022-10-13: 6 [IU] via SUBCUTANEOUS

## 2022-10-12 MED ORDER — DEXTROSE IN LACTATED RINGERS 5 % IV SOLN: INTRAVENOUS | Status: DC

## 2022-10-12 MED ORDER — PIPERACILLIN-TAZOBACTAM 3.375 G IVPB
3.3750 g | Freq: Three times a day (TID) | INTRAVENOUS | Status: AC
  Administered 2022-10-12 – 2022-10-18 (×19): 3.375 g via INTRAVENOUS
  Filled 2022-10-12 (×21): qty 50

## 2022-10-12 MED ORDER — INSULIN ASPART 100 UNIT/ML IJ SOLN
10.0000 [IU] | Freq: Once | INTRAMUSCULAR | Status: AC
  Administered 2022-10-12: 10 [IU] via INTRAVENOUS

## 2022-10-12 MED ORDER — SODIUM ZIRCONIUM CYCLOSILICATE 10 G PO PACK
10.0000 g | PACK | Freq: Once | ORAL | Status: AC
  Administered 2022-10-12: 10 g via ORAL
  Filled 2022-10-12: qty 1

## 2022-10-12 MED ORDER — INSULIN REGULAR(HUMAN) IN NACL 100-0.9 UT/100ML-% IV SOLN
INTRAVENOUS | Status: DC
  Administered 2022-10-12: 14 [IU]/h via INTRAVENOUS
  Filled 2022-10-12: qty 100

## 2022-10-12 MED ORDER — LACTATED RINGERS IV SOLN: INTRAVENOUS | Status: DC

## 2022-10-12 MED ORDER — FUROSEMIDE 10 MG/ML IJ SOLN
40.0000 mg | Freq: Two times a day (BID) | INTRAMUSCULAR | Status: DC
  Administered 2022-10-12: 40 mg via INTRAVENOUS
  Filled 2022-10-12: qty 4

## 2022-10-12 MED ORDER — DEXTROSE 50 % IV SOLN: 0.0000 mL | INTRAVENOUS | Status: DC | PRN

## 2022-10-12 MED ORDER — FUROSEMIDE 10 MG/ML IJ SOLN
INTRAMUSCULAR | Status: AC
  Filled 2022-10-12: qty 4

## 2022-10-12 MED ORDER — FUROSEMIDE 10 MG/ML IJ SOLN
40.0000 mg | Freq: Once | INTRAMUSCULAR | Status: AC
  Administered 2022-10-12: 40 mg via INTRAVENOUS

## 2022-10-12 MED ORDER — INSULIN DETEMIR 100 UNIT/ML ~~LOC~~ SOLN
18.0000 [IU] | Freq: Every day | SUBCUTANEOUS | Status: DC
  Administered 2022-10-13: 18 [IU] via SUBCUTANEOUS
  Filled 2022-10-12 (×2): qty 0.18

## 2022-10-12 MED ORDER — LORAZEPAM 2 MG/ML IJ SOLN
1.0000 mg | Freq: Four times a day (QID) | INTRAMUSCULAR | Status: DC | PRN
  Administered 2022-10-12 – 2022-10-13 (×2): 1 mg via INTRAVENOUS
  Filled 2022-10-12 (×2): qty 1

## 2022-10-12 NOTE — Progress Notes (Signed)
"   Patient Contract for Insulin Pump Therapy" signed by patient and myself.  Put in chart.  Stacie Glaze LPN

## 2022-10-12 NOTE — Inpatient Diabetes Management (Addendum)
Inpatient Diabetes Program Recommendations  AACE/ADA: New Consensus Statement on Inpatient Glycemic Control (2015)  Target Ranges:  Prepandial:   less than 140 mg/dL      Peak postprandial:   less than 180 mg/dL (1-2 hours)      Critically ill patients:  140 - 180 mg/dL   Lab Results  Component Value Date   GLUCAP 349 (H) 10/12/2022   HGBA1C 6.7 (H) 08/27/2022    Review of Glycemic Control  Diabetes history: DM2 Outpatient Diabetes medications: Insulin pump with Novolog up to 100 units once daily, Trulicity 4.5 mg weekly Current orders for Inpatient glycemic control: Insulin pump, Novolog 0-9 TID  HgbA1C - 6.7% On Pred 40 with breakfast  Inpatient Diabetes Program Recommendations:    Discontinue insulin pump.  IV insulin per EndoTool for hyperglycemia  If not IV insulin,   Semglee 30 units Q24H Novolog 0-20 Q4H  Spoke with pt at bedside on 57M prior to transferring to 31M. Pt could not tell me pump settings. Last Endo visit 03/2022.  Waiting for call from Endo regarding insulin pump settings.  Will follow closely.  Thank you. Ailene Ards, RD, LDN, CDCES Inpatient Diabetes Coordinator [phone]  Addendum:  Pump setting per Endo:  Basal: MN-1800 - 1.5               1800 - 1.6 - Total 36.9 units/24H CF - 20 with Goal of 110 ICR: 1:4

## 2022-10-12 NOTE — Progress Notes (Signed)
Insulin infusion started per endo tool per orders . I have removed and discontinued patient's insulin pump per orders . Insulin pump bagged with belongings however  I have asked her family to take it home.

## 2022-10-12 NOTE — Progress Notes (Signed)
Critical lab result Potassium 6.9. MD on call notified.

## 2022-10-12 NOTE — Progress Notes (Signed)
IV therapy was only able to obtain one PIV on right arm . Left hand IV remains for use despite fistula to left arm that has not been used in 6 years . Instructions to use if unable to obtain other IV access per Dr Glenna Fellows and Dr Merrily Pew

## 2022-10-12 NOTE — Consult Note (Signed)
NAME:  Linda Ellison, MRN:  [id number], DOB:  1975-08-16, LOS: 2 ADMISSION DATE:  10/09/2022, CONSULTATION DATE: 10/12/2022 REFERRING MD: Triad, CHIEF COMPLAINT: Respiratory distress  History of Present Illness:  47 year old female with extensive past medical history is well-documented below who was admitted 2 days prior to this consult with multifocal pneumonia.  She has had increasing work of breathing with a respiratory rate of 46.  She had a CT scan that demonstrates bilateral multifocal pneumonia, elevated BNP, and increased work of breathing that will require her being transferred to the intensive care unit.  She has noted a nonproductive cough prior to admission.  Increasing shortness of breath prior to admission.  And increasing lower extremity edema.  She is status post cadaveric renal transplant and she is followed by nephrology.  Currently she is hypokalemic with creatinine in the 2.2 range.  Due to her increasing work of breathing impending respiratory failure she will be transferred to intensive care unit for further evaluation and treatment.  She has already been started on aggressive diuresis and steroids and empirical antimicrobial therapy.  Pertinent  Medical History   Past Medical History:  Diagnosis Date   Allergy    Anemia associated with chronic renal failure    Anxiety    Asthma    Atypical chest pain    long-standing -- normal cardio cath 06-27-2012 and normal nuclear stress test 06-25-2016   AV (arteriovenous fistula) (HCC)    for dialysis-- currently located left radiocephalic   CAD (coronary artery disease) cardiologist-- dr Sharol Roussel [facility] cardiology in [place])   a. False positive stress echo 06/2012 at [facility] - cath with no obstructive CAD at [facility] (mild luminal irregularities in LAD, moderate diffuse disease in distal RCA).   Depression    Elevated lipids    ESRD on hemodialysis [facility]) NEPHROLOGIST-  DR MATTINGLY   started dialysis 01/29/11:  Foye Spurling [facility] on MWF   Gastroparesis    GERD (gastroesophageal reflux disease)    History of pneumonia    HCAP 04/ 2017   Hyperlipidemia    Hyperthyroidism    Menorrhagia    Other secondary hypertension    associated to diabetes -- followed by cardiologist ( dr Sharol Roussel)     Peripheral neuropathy    PONV (postoperative nausea and vomiting)    Pre-transplant evaluation for kidney transplant    [facility]   Renal failure syndrome 03/05/2022   Secondary hyperparathyroidism of renal origin [facility])    Sleep apnea    Type 2 diabetes mellitus, with long-term current use of insulin (HCC)    dx 1985     Significant Hospital Events: Including procedures, antibiotic start and stop dates in addition to other pertinent events     Interim History / Subjective:  Increased work of breathing generalized respiratory failure  Objective   Blood pressure (!) 150/78, pulse 92, temperature (!) 97.5 F (36.4 C), temperature source Oral, resp. rate 18, height 5' 6.5" (1.689 m), weight 119 kg, SpO2 (!) 68 %.        Intake/Output Summary (Last 24 hours) at 10/12/2022 1049 Last data filed at 10/12/2022 1308 Gross per 24 hour  Intake 920.29 ml  Output 400 ml  Net 520.29 ml   Filed Weights   10/09/22 1844 10/10/22 0606  Weight: 114.3 kg 119 kg    Examination: General: Morbidly obese 47 year old female who is in obvious respiratory distress HENT: JVD is appreciated Lungs: Coarse rhonchi bilaterally component of  vocal cord dysfunction tachypneic and respiratory rate of 46 Cardiovascular: Heart sounds are distant Abdomen: Obese soft nontender Extremities: 3+ pitting edema lower extremities Neuro: Anxious but otherwise intact GU: 400 cc urine output in last 24 hours  Resolved Hospital Problem list     Assessment & Plan:  Acute respiratory distress in the setting of bilateral multifocal pneumonia and renal transplant patient who is  immunosuppressed with increased work of breathing, tachypnea. Transferred to intensive care unit O2 sat monitoring and O2 to keep sats greater than 92% Agree with empirical antimicrobial therapy Panculture May need intubation if so she will need bronchoscopy for culture data Continue diuretics as tolerated, she is being followed by renal Continue steroids Pulmonary toilet  Acute renal failure in the setting of cadaver renal transplant Electrolyte disarray Renal is following Diuresis as tolerated Continue immunosuppressants  Diabetes mellitus CBG (last 3)  Recent Labs    10/11/22 2144 10/12/22 0207 10/12/22 0723  GLUCAP 358* 353* 304*   Sliding-scale insulin coverage  Anxiety Anxiolytics as needed  Chronic anemia Recent Labs    10/11/22 0857 10/12/22 0318  HGB 10.3* 10.5*   Monitor  Best Practice (right click and "Reselect all SmartList Selections" daily)   Diet/type: Regular consistency (see orders) DVT prophylaxis: prophylactic heparin  GI prophylaxis: PPI Lines: N/A Foley:  N/A Code Status:  full code Last date of multidisciplinary goals of care discussion [tbd]  Labs   CBC: Recent Labs  Lab 10/09/22 2009 10/10/22 0216 10/11/22 0857 10/12/22 0318  WBC 16.9* 16.9* 26.9* 25.3*  NEUTROABS 13.5*  --  23.9* 22.5*  HGB 9.9* 9.6* 10.3* 10.5*  HCT 29.8* 29.3* 31.9* 32.5*  MCV 77.0* 77.3* 81.4 80.8  PLT 365 351 411* 397    Basic Metabolic Panel: Recent Labs  Lab 10/09/22 2009 10/10/22 0216 10/10/22 0636 10/11/22 0857 10/12/22 0318 10/12/22 0714  NA 131* 136 134* 133* 133* 134*  K 2.5* 2.9* 3.2* 4.4 6.9* 5.7*  CL 94* 97* 99 99 100 100  CO2 20* 19* 21* 18* 15* 18*  GLUCOSE 138* 107* 96 236* 365* 332*  BUN 71* 67* 67* 72* 87* 88*  CREATININE 5.05* 4.24* 4.06* 2.21* 2.20* 2.24*  CALCIUM 6.8* 7.0* 7.0* 7.5* 7.8* 7.7*  MG 1.5*  --  1.4* 1.8  --   --   PHOS  --   --   --  4.3 4.8*  --    GFR: Estimated Creatinine Clearance: 41.6 mL/min (A) (by  C-G formula based on SCr of 2.24 mg/dL (H)). Recent Labs  Lab 10/09/22 2009 10/10/22 0216 10/11/22 0857 10/12/22 0318  WBC 16.9* 16.9* 26.9* 25.3*    Liver Function Tests: Recent Labs  Lab 10/10/22 0216 10/11/22 0857 10/12/22 0318  AST 13*  --   --   ALT 17  --   --   ALKPHOS 180*  --   --   BILITOT 0.6  --   --   PROT 6.8  --   --   ALBUMIN 2.6* 2.6* 2.6*   No results for input(s): "LIPASE", "AMYLASE" in the last 168 hours. No results for input(s): "AMMONIA" in the last 168 hours.  ABG    Component Value Date/Time   PHART 7.44 10/10/2022 0213   PCO2ART 33 10/10/2022 0213   PO2ART 48 (L) 10/10/2022 0213   HCO3 22.4 10/10/2022 0213   TCO2 33 08/19/2016 0706   ACIDBASEDEF 1.1 10/10/2022 0213   O2SAT 82.1 10/10/2022 0213     Coagulation Profile: No results for input(s): "  INR", "PROTIME" in the last 168 hours.  Cardiac Enzymes: No results for input(s): "CKTOTAL", "CKMB", "CKMBINDEX", "TROPONINI" in the last 168 hours.  HbA1C: Hgb A1c MFr Bld  Date/Time Value Ref Range Status  08/27/2022 03:43 AM 6.7 (H) 4.8 - 5.6 % Final    Comment:    (NOTE)         Prediabetes: 5.7 - 6.4         Diabetes: >6.4         Glycemic control for adults with diabetes: <7.0   11/21/2017 06:49 PM 9.9 (H) 4.8 - 5.6 % Final    Comment:    (NOTE) Pre diabetes:          5.7%-6.4% Diabetes:              >6.4% Glycemic control for   <7.0% adults with diabetes     CBG: Recent Labs  Lab 10/11/22 1131 10/11/22 1644 10/11/22 2144 10/12/22 0207 10/12/22 0723  GLUCAP 270* 379* 358* 353* 304*    Review of Systems:   10 point review of system taken, please see HPI for positives and negatives.   Past Medical History:  She,  has a past medical history of Allergy, Anemia associated with chronic renal failure, Anxiety, Asthma, Atypical chest pain, AV (arteriovenous fistula) (HCC), CAD (coronary artery disease) (cardiologist-- dr Sharol Roussel [facility] [place] [facility] in [place] Depression, Elevated lipids, ESRD on hemodialysis (HCC) (NEPHROLOGIST-  DR MATTINGLY), Gastroparesis, GERD (gastroesophageal reflux disease), History of pneumonia, Hyperlipidemia, Hyperthyroidism, Menorrhagia, Other secondary hypertension, Peripheral neuropathy, PONV (postoperative nausea and vomiting), Pre-transplant evaluation for kidney transplant, Renal failure syndrome (03/05/2022), Secondary hyperparathyroidism of renal origin [facility]), Sleep apnea, and Type 2 diabetes mellitus, with long-term current use of insulin (HCC).   Surgical History:   Past Surgical History:  Procedure Laterality Date   AV FISTULA PLACEMENT  08/2010   Left radiocephalic AVF   AV FISTULA PLACEMENT Right 05/07/2014   Procedure: ARTERIOVENOUS (AV) FISTULA CREATION;  Surgeon: Sherren Kerns, MD;  Location: [facility] OR;  Service: Vascular;  Laterality: Right;   AV FISTULA PLACEMENT Right 07/02/2014   Procedure: INSERTION OF ARTERIOVENOUS (AV) GORE-TEX GRAFT ARM;  Surgeon: Sherren Kerns, MD;  Location: [facility] OR;  Service: Vascular;  Laterality: Right;   CARDIOVASCULAR STRESS TEST  06/25/2016   [facility]   normal nuclear perfusion study w/ no ischemia/  normal LV function and wall motion , ef 51%   CESAREAN SECTION  03/22/2001   w/ Bilateral Tubal Ligation   COLONOSCOPY     COLONOSCOPY WITH ESOPHAGOGASTRODUODENOSCOPY (EGD)  10/19/2011   DILATION AND CURETTAGE OF UTERUS  05/12/2000   w/ suction for missed ab   DOBUTAMINE STRESS ECHO  06/04/2016    [facility]   normal stress echo w/ no chest pain or ischemia/  normal LV function and wall motion , stress ef 60-65%   ESOPHAGOGASTRODUODENOSCOPY  10/19/2011   Dr. Stan Head   FEMUR IM NAIL Left child   removed   FOOT SURGERY Left 2014 approx.   HYSTEROSCOPY WITH NOVASURE N/A 08/19/2016   Procedure: HYSTEROSCOPY WITH NOVASURE;  Surgeon: Huel Cote, MD;  Location: [facility];  Service: Gynecology;  Laterality: N/A;   KIDNEY TRANSPLANT  09/18/2016    LAPAROSCOPIC CHOLECYSTECTOMY  1994   LEFT HEART CATHETERIZATION WITH CORONARY ANGIOGRAM N/A 06/27/2012   Procedure: LEFT HEART CATHETERIZATION WITH CORONARY ANGIOGRAM;  Surgeon: Laurey Morale, MD;  Location[facility] CATH LAB;  Service: Cardiovascular;  Laterality: N/A; No ostructive CAD, normal  LVF, ef 55-60% (mild LAD luminal irregularities, moderate dRCA diffuse disease)   LIGATION GORETEX FISTULA  01/04/11   Left AVF   LIGATION OF ARTERIOVENOUS  FISTULA Left 08/21/2014   Procedure: LIGATION OF ARTERIOVENOUS  FISTULA;  Surgeon: Annice Needy, MD;  Location: [facility] ORS;  Service: Vascular;  Laterality: Left;   LIGATION OF ARTERIOVENOUS  FISTULA Left 06/29/2017   Procedure: LIGATION OF ARTERIOVENOUS  FISTULA ( RADIOCEPHALIC );  Surgeon: Annice Needy, MD;  Location: [facility] ORS;  Service: Vascular;  Laterality: Left;   NEPHRECTOMY TRANSPLANTED ORGAN     PERIPHERAL VASCULAR CATHETERIZATION Left 08/08/2014   Procedure: Upper Extremity Angiography;  Surgeon: Annice Needy, MD;  Location: [facility] INVASIVE CV LAB;  Service: Cardiovascular;  Laterality: Left;   PERIPHERAL VASCULAR CATHETERIZATION Left 08/08/2014   Procedure: Upper Extremity Intervention;  Surgeon: Annice Needy, MD;  Location: [facility] INVASIVE CV LAB;  Service: Cardiovascular;  Laterality: Left;   PERIPHERAL VASCULAR CATHETERIZATION Left 03/08/2016   Procedure: A/V Fistulagram;  Surgeon: Annice Needy, MD;  Location: [facility] INVASIVE CV LAB;  Service: Cardiovascular;  Laterality: Left;   POLYPECTOMY     THROMBECTOMY AND REVISION OF ARTERIOVENTOUS (AV) GORETEX  GRAFT Right 07/16/2014   Procedure: THROMBECTOMY  Right  arm  ARTERIOVENOUS  GORETEX  GRAFT;  Surgeon: Sherren Kerns, MD;  Location: [facility] OR;  Service: Vascular;  Laterality: Right;   TRANSTHORACIC ECHOCARDIOGRAM  06/04/2016   mild concentric LVH, ef 55-60%,  mild TR,  borderline LAE,  trivial MR and PR     Social History:   reports that she has never smoked. She has never used smokeless tobacco. She reports  that she does not drink alcohol and does not use drugs.   Family History:  Her family history includes Arthritis in her brother; Diabetes in her father, maternal aunt, and paternal aunt; Heart disease in her brother, mother, and paternal aunt; Hypertension in her father, maternal aunt, and paternal aunt; Kidney disease in her maternal grandmother and mother; Kidney failure in her paternal aunt and paternal uncle. There is no history of Colon cancer, Colon polyps, Crohn's disease, Esophageal cancer, Rectal cancer, Stomach cancer, or Ulcerative colitis.   Allergies Allergies  Allergen Reactions   Ciprofloxacin Hives, Itching and Nausea And Vomiting   Fluocinolone Itching, Nausea And Vomiting and Other (See Comments)   Hydromorphone Hives   Strawberry Extract Itching and Other (See Comments)    Pt states that this medication makes her tongue raw.   No reaction   Hydromorphone Hcl Hives   Phenytoin Sodium Extended Itching   Adhesive [Tape] Itching and Rash   Chlorhexidine Gluconate Itching   Clindamycin Diarrhea, Nausea And Vomiting and Rash   Shrimp [Shellfish Allergy] Rash     Home Medications  Prior to Admission medications   Medication Sig Start Date End Date Taking? Authorizing Provider  albuterol (PROVENTIL HFA;VENTOLIN HFA) 108 (90 Base) MCG/ACT inhaler Inhale 2 puffs into the lungs every 6 (six) hours as needed for wheezing or shortness of breath. 11/25/17  Yes Nwoko, Agnes I, NP  amLODipine (NORVASC) 10 MG tablet Take 1 tablet by mouth daily. 02/11/22  Yes [provider]  ARIPiprazole (ABILIFY) 30 MG tablet Take 30 mg by mouth daily. 01/26/22  Yes [provider]  atorvastatin (LIPITOR) 10 MG tablet Take 10 mg by mouth daily. 02/08/22  Yes [provider]  buPROPion (WELLBUTRIN XL) 150 MG 24 hr tablet Take 1 tablet (150 mg total) by mouth daily after breakfast. For depression  Patient taking differently: Take 150 mg by mouth daily after breakfast. Take with  300mg  for a total daily dose of 450mg  for depression 11/26/17  Yes Nwoko, Agnes I, NP  buPROPion (WELLBUTRIN XL) 300 MG 24 hr tablet Take 300 mg by mouth daily.   Yes [provider]  cetirizine (ZYRTEC) 10 MG tablet Take 10 mg by mouth 2 (two) times daily.   Yes [provider]  cinacalcet (SENSIPAR) 30 MG tablet Take 30 mg by mouth daily. 03/10/22  Yes [provider]  citalopram (CELEXA) 40 MG tablet Take 40 mg by mouth daily.   Yes [provider]  cloNIDine (CATAPRES) 0.3 MG tablet Take 0.3 mg by mouth 3 (three) times daily.    Yes [provider]  Dulaglutide 4.5 MG/0.5ML SOPN Inject 4.5 mg into the skin once a week. Every Tuesday 12/09/21  Yes [provider]  gabapentin (NEURONTIN) 300 MG capsule Take 300 mg by mouth at bedtime as needed. pain   Yes [provider]  hydrOXYzine (ATARAX) 25 MG tablet Take 25 mg by mouth at bedtime.   Yes [provider]  insulin aspart (NOVOLOG) 100 UNIT/ML injection USE UP TO 100 UNITS ONCE DAILY IN INSULIN PUMP. DX CODE E11.42 03/10/22  Yes [provider]  Insulin Human (INSULIN PUMP) SOLN Inject 1 each into the skin See admin instructions.   Yes [provider]  labetalol (NORMODYNE) 200 MG tablet Take 2 tablets (400 mg total) by mouth 3 (three) times daily. For high blood pressure 11/25/17  Yes Nwoko, Agnes I, NP  montelukast (SINGULAIR) 10 MG tablet Take 10 mg by mouth at bedtime.   Yes [provider]  mycophenolate (MYFORTIC) 180 MG EC tablet Take 720 mg by mouth 2 (two) times daily.   Yes [provider]  Oxycodone HCl 10 MG TABS Take 10 mg by mouth See admin instructions. Take 1 tablet by mouth by mouth every 4 to 6 hours as needed for sciatic nerve and back pain   Yes [provider]  pantoprazole (PROTONIX) 40 MG tablet Take 1 tablet (40 mg total) by mouth daily. For acid reflux 11/26/17  Yes Nwoko, Nicole Kindred I, NP  prazosin (MINIPRESS) 1 MG  capsule Take 1 mg by mouth at bedtime.   Yes [provider]  predniSONE (DELTASONE) 5 MG tablet Take 5 mg by mouth daily with breakfast.   Yes [provider]  simvastatin (ZOCOR) 10 MG tablet Take 1 tablet (10 mg total) by mouth at bedtime. For high cholesterol Patient taking differently: Take 10 mg by mouth daily. For high cholesterol 11/25/17  Yes Nwoko, Nicole Kindred I, NP  sodium bicarbonate 650 MG tablet Take 1,300 mg by mouth 2 (two) times daily.   Yes [provider]  sulfamethoxazole-trimethoprim (BACTRIM,SEPTRA) 400-80 MG tablet Take 1 tablet by mouth 3 (three) times a week. For infection Patient taking differently: Take 1 tablet by mouth every Monday, Wednesday, and Friday. For infection 11/28/17  Yes Nwoko, Nicole Kindred I, NP  tacrolimus (PROGRAF) 1 MG capsule Take 4-5 mg by mouth See admin instructions. Take 5 capsules by mouth in the morning and 4 capsules at night   Yes [provider]  zolpidem (AMBIEN) 5 MG tablet Take 1 tablet (5 mg total) by mouth at bedtime as needed for sleep. Patient taking differently: Take 5 mg by mouth at bedtime. 11/25/17  Yes Armandina Stammer I, NP  naloxone [facility]) nasal spray 4 mg/0.1 mL  10/28/21   [provider]  Critical care time: 34 min    Brett Canales Lebaron Bautch ACNP Acute Care Nurse Practitioner [facility] Pulmonary/Critical Care Please consult Amion 10/12/2022, 10:50 AM

## 2022-10-12 NOTE — Progress Notes (Signed)
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  Patient noted to have been recently admitted 08/26/2022-08/28/2022 in the setting of norovirus diarrhea, hypokalemia and hypomagnesemia.  Patient endorsed compliance with immunosuppressive medications at home.  Patient seen in the ED noted to have desatted to 80% on room air which improved with nasal cannula, lab work done concerning for hypokalemia and hypomagnesemia.  CBC with a leukocytosis.  Patient also noted to be in acute on chronic renal failure with a creatinine of 5.05 on presentation and baseline approximately 1.5.  Chest x-ray done concerning for multifocal pneumonia.  Patient placed empirically on IV antibiotics.  Patient admitted for further evaluation and management.   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)   History of CAD (coronary artery disease)   CAD (coronary artery disease)   DM (diabetes mellitus) type II controlled with renal manifestation (HCC)   Gastroparesis due to DM (HCC)   Chronic  anemia   Metabolic acidosis   Essential hypertension   Peripheral neuropathy   Generalized anxiety disorder   Hypocalcemia   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 -Per admitting physician patient noted to have mold in the house for at least the past 6 months. -Respiratory viral panel negative. -SARS coronavirus 2 PCR negative, influenza A and B PCR negative. -Patient states no significant clinical improvement since admission. -Sputum Gram stain and culture preliminary results with moderate gram-positive cocci, rare gram-positive rods, rare gram-negative rods.  Blood cultures pending. -MRSA PCR negative. -Urine strep pneumococcus antigen negative.  Urine Legionella antigen pending. -LDH within normal limits at 168. -Repeat chest x-ray shows worsening bilateral infiltrates. -Lasix 40 mg IV x 1 given on 10/11/2022. -BNP noted to be elevated. -Patient with worsening respiratory status with no significant improvement. -.  CT chest done this morning with extensive patchy groundglass infiltrates noted in both lungs suggesting multifocal pneumonia, possibly atypical viral pneumonia.  Small bilateral pleural effusions.  Small pericardial effusion. -Continue empiric doxycycline, cefepime, scheduled DuoNebs, prednisone, PPI, Mucinex, Claritin. -Due to worsening respiratory status PCCM consulted to assess the patient and patient transferred to the ICU for closer monitoring and further management.  2.  Acute kidney injury of transplanted kidney/ESRD secondary to diabetic  nephropathy status post renal transplant 10/2016/history of renal artery stenosis status post  angioplasty and stent placement -Patient with history of renal transplant 2018 being followed by the transplant nephrology team at [facility]. -Creatinine on presentation noted at 5.05 with baseline creatinine 1.3-1.8. -Patient noted on admission to have borderline hypotension. -Acute kidney injury likely secondary to a prerenal azotemia in the setting of low blood pressure, GI losses, poor oral intake. -Transplant ultrasound negative for hydronephrosis. -Renal function slowly improving with hydration creatinine currently at 2.24 from 2.21 from 4.06 from 5.05 on admission. -IV fluids saline locked and patient received Lasix 40 mg IV x 1 on 10/11/2022. -Nephrology consulted and feel no indication for dialysis at this time and to monitor closely with hydration. -Saline lock IV fluids. -Avoid nephrotoxins. -Nephrology recommended decreasing patient's Myfortic to 360 mg twice daily, continuation of Prograf 5 mg every morning and 4 mg every afternoon.  Patient on steroids due to problem #1. -Per nephrology.  3.  Hypomagnesemia/hypokalemia -Likely secondary to GI losses. -Repleted. -Potassium at 5.7 this morning, magnesium at 1.8. -Repeat labs in the AM,.  4.  Hyperkalemia -Potassium of 5.7 today. -Lokelma 10 mg p.o. x 1.  5. insulin-dependent diabetes mellitus type 1 -Hemoglobin A1c 6.7. -Patient noted not to have batteries on the insulin pump on 10/11/2022. -Patient's fianc went and obtain the supplies and brought them back in and patient placed back in.. -Continue SSI.   -If blood sugars continue to remain elevated despite insulin pump may need to discontinue insulin pump and placed on long-acting insulin as well as SSI.    6.  History of nonobstructive CAD -Patient not on any antiplatelet therapy. -Statin.   7.  Anemia of chronic disease -Follow H&H.  8.  Anion  gap metabolic acidosis -Likely secondary to acute kidney injury.  Patient also noted to have had some recent diarrhea. -Stable.  9.  Hypertension -Home antihypertensive medications held due to soft blood pressure BP improved., BP improved.   -Patient received IV Lasix. -Once BP improves could likely resume home antihypertensive medications.  10.  Peripheral neuropathy -Gabapentin on hold.  11.  General anxiety disorder/insomnia -Continue Abilify, Wellbutrin.   -Celexa on hold.   12.  Chronic hypocalcemia -Continue Cinacalcet. -   DVT prophylaxis: Heparin Code Status: Full Family Communication: Updated patient and fianc at bedside.   Disposition: Transferred to ICU for closer monitoring.   Status is: Inpatient The patient will require care spanning > 2 midnights and should be moved to inpatient because: Severity of illness   Consultants:  Nephrology: Dr. Arrie Aran 10/10/2022 PCCM: Dr. Merrily Pew 10/12/2022  Procedures:  -Chest x-ray 10/09/2022, 10/11/2022 Transplant ultrasound 10/10/2022 CT chest 10/12/2022    Antimicrobials:  Anti-infectives (From admission, onward)    Start     Dose/Rate Route Frequency Ordered Stop   10/11/22 1230  ceFEPIme (MAXIPIME) 2 g in sodium chloride 0.9 % 100 mL IVPB        2 g 200 mL/hr over 30 Minutes Intravenous Every 12 hours 10/11/22 1158     10/11/22 1000  doxycycline (VIBRA-TABS) tablet 100 mg  Status:  Discontinued        100 mg Oral Daily 10/10/22 0117 10/10/22 0126   10/11/22 0000  ceFEPIme (MAXIPIME) 2 g in sodium chloride 0.9 % 100 mL IVPB  Status:  Discontinued        2 g 200 mL/hr over 30 Minutes Intravenous Every 24 hours 10/10/22 0122 10/10/22 0123   10/11/22 0000  ceFEPIme (MAXIPIME) 1 g in sodium chloride 0.9 % 100 mL IVPB  Status:  Discontinued        1 g 200 mL/hr over 30 Minutes Intravenous Every 24 hours 10/10/22 0123 10/11/22 1158   10/10/22 0215  doxycycline (VIBRA-TABS) tablet 100 mg        100 mg Oral Every 12 hours  10/10/22 0126 10/16/22 2159   10/09/22 2315  ceFEPIme (MAXIPIME) 2 g in sodium chloride 0.9 % 100 mL IVPB        2 g 200 mL/hr over 30 Minutes Intravenous  Once 10/09/22 2303 10/10/22 0135   10/09/22 2130  doxycycline (VIBRAMYCIN) 100 mg in sodium chloride 0.9 % 250 mL IVPB        100 mg 125 mL/hr over 120 Minutes Intravenous  Once 10/09/22 2115 10/10/22 0011   10/09/22 2053  ceFEPIme (MAXIPIME) 1 g in sodium chloride 0.9 % 100 mL IVPB  Status:  Discontinued        1 g 200 mL/hr over 30 Minutes Intravenous Every 24 hours 10/09/22 2054 10/09/22 2303   10/09/22 2045  ceFEPIme (MAXIPIME) 2 g in sodium chloride 0.9 % 100 mL IVPB  Status:  Discontinued        2 g 200 mL/hr over 30 Minutes Intravenous Every 24 hours 10/09/22 2040 10/09/22 2054         Subjective: Sitting up at the side of the bed just returned from CT.  States no improvement with shortness of breath and worsening shortness of breath.  No chest pain.  No abdominal pain.  Patient with a productive cough with greenish sputum.  Fianc at bedside.   Objective: Vitals:   10/11/22 1610 10/11/22 2049 10/12/22 0504 10/12/22 1012  BP: (!) 144/73 (!) 149/75 (!) 149/74 (!) 150/78  Pulse: 94 93 93 92  Resp: 18 (!) 24 (!) 22 18  Temp: 98.2 F (36.8 C) (!) 97.4 F (36.3 C)  (!) 97.5 F (36.4 C)  TempSrc:  Oral  Oral  SpO2: (!) 81% (!) 87% 97% (!) 68%  Weight:      Height:        Intake/Output Summary (Last 24 hours) at 10/12/2022 1039 Last data filed at 10/12/2022 0837 Gross per 24 hour  Intake 920.29 ml  Output 400 ml  Net 520.29 ml    Filed Weights   10/09/22 1844 10/10/22 0606  Weight: 114.3 kg 119 kg    Examination:  General exam: Appears calm and comfortable  Respiratory system: Diffuse coarse rhonchorous breath sounds with some gurgling noted.  Minimal wheezing.  Cardiovascular system: RRR no murmurs rubs or gallops.  1-2+ bilateral lower extremity edema.  Gastrointestinal system: Abdomen is soft, nontender,  nondistended, positive bowel sounds.  No rebound.  No guarding.  Central nervous system: Alert and oriented. No focal neurological deficits. Extremities: Symmetric 5 x 5 power. Skin: No rashes, lesions or ulcers Psychiatry: Judgement and insight appear normal. Mood & affect appropriate.     Data Reviewed: I have personally reviewed following labs and imaging studies  CBC: Recent Labs  Lab 10/09/22 2009 10/10/22 0216 10/11/22 0857 10/12/22 0318  WBC 16.9* 16.9* 26.9* 25.3*  NEUTROABS 13.5*  --  23.9* 22.5*  HGB 9.9* 9.6* 10.3* 10.5*  HCT 29.8* 29.3* 31.9* 32.5*  MCV 77.0* 77.3* 81.4 80.8  PLT 365 351 411* 397     Basic Metabolic Panel: Recent Labs  Lab 10/09/22 2009 10/10/22 0216 10/10/22 0636 10/11/22 0857 10/12/22 0318 10/12/22 0714  NA 131* 136 134* 133* 133* 134*  K 2.5* 2.9* 3.2* 4.4 6.9* 5.7*  CL 94* 97* 99 99 100 100  CO2 20* 19* 21* 18* 15* 18*  GLUCOSE 138* 107* 96 236* 365* 332*  BUN 71* 67* 67* 72* 87* 88*  CREATININE 5.05* 4.24* 4.06* 2.21* 2.20* 2.24*  CALCIUM 6.8* 7.0* 7.0* 7.5* 7.8* 7.7*  MG 1.5*  --  1.4* 1.8  --   --   PHOS  --   --   --  4.3 4.8*  --      GFR: Estimated Creatinine Clearance: 41.6 mL/min (A) (by C-G formula based on SCr of 2.24 mg/dL (H)).  Liver Function Tests: Recent Labs  Lab 10/10/22 0216 10/11/22 0857 10/12/22 0318  AST 13*  --   --   ALT 17  --   --   ALKPHOS 180*  --   --   BILITOT 0.6  --   --   PROT 6.8  --   --   ALBUMIN 2.6* 2.6* 2.6*     CBG: Recent Labs  Lab 10/11/22 1131 10/11/22 1644 10/11/22 2144 10/12/22 0207 10/12/22 0723  GLUCAP 270* 379* 358* 353* 304*      Recent Results (from the past 240 hour(s))  Resp panel by RT-PCR (RSV, Flu A&B, Covid) Anterior Nasal Swab     Status: None   Collection Time: 10/09/22  8:21 PM   Specimen: Anterior Nasal Swab  Result Value Ref Range Status   SARS Coronavirus 2 by RT PCR NEGATIVE NEGATIVE Final    Comment: (NOTE) SARS-CoV-2 target nucleic  acids are NOT DETECTED.  The SARS-CoV-2 RNA is generally detectable in upper respiratory specimens during the acute phase of infection. The lowest concentration of SARS-CoV-2 viral copies this assay can detect is 138 copies/mL. A negative result does not preclude SARS-Cov-2 infection and should not be used as the sole basis for treatment or other patient management decisions. A negative result may occur with  improper specimen collection/handling, submission of specimen other than nasopharyngeal swab, presence of viral mutation(s) within the areas targeted by this assay, and inadequate number of viral copies(<138 copies/mL). A negative result must be combined with clinical observations, patient history, and epidemiological information. The expected result is Negative.  Fact Sheet for Patients:  BloggerCourse.com  Fact Sheet for Healthcare Providers:  SeriousBroker.it  This test is no t yet approved or cleared by the [place] FDA and  has been authorized for detection and/or diagnosis of SARS-CoV-2 by FDA under an Emergency Use Authorization (EUA). This EUA will remain  in effect (meaning this test can be used) for the duration of the COVID-19 declaration under Section 564(b)(1) of the Act, 21 U.S.C.section 360bbb-3(b)(1), unless the authorization is terminated  or revoked sooner.       Influenza A by PCR NEGATIVE NEGATIVE Final   Influenza B by PCR NEGATIVE NEGATIVE Final    Comment: (NOTE) The Xpert Xpress SARS-CoV-2/FLU/RSV plus assay is intended as an aid in the diagnosis of influenza from Nasopharyngeal swab specimens and should not be used as a sole basis for treatment. Nasal washings and aspirates are unacceptable for Xpert Xpress SARS-CoV-2/FLU/RSV testing.  Fact Sheet for Patients: BloggerCourse.com  Fact Sheet for Healthcare Providers: SeriousBroker.it  This  test is not yet approved or cleared by the [place] FDA and has been authorized for detection and/or diagnosis of SARS-CoV-2 by FDA under an Emergency Use Authorization (EUA). This EUA will remain in effect (meaning this test can be used) for the duration of the COVID-19 declaration under Section 564(b)(1) of the Act, 21 U.S.C.  section 360bbb-3(b)(1), unless the authorization is terminated or revoked.     Resp Syncytial Virus by PCR NEGATIVE NEGATIVE Final    Comment: (NOTE) Fact Sheet for Patients: BloggerCourse.com  Fact Sheet for Healthcare Providers: SeriousBroker.it  This test is not yet approved or cleared by the [place] FDA and has been authorized for detection and/or diagnosis of SARS-CoV-2 by FDA under an Emergency Use Authorization (EUA). This EUA will remain in effect (meaning this test can be used) for the duration of the COVID-19 declaration under Section 564(b)(1) of the Act, 21 U.S.C. section 360bbb-3(b)(1), unless the authorization is terminated or revoked.  Performed at [facility], [address] [place], [place] [address]   Blood culture (routine x 2)     Status: None (Preliminary result)   Collection Time: 10/09/22  8:50 PM   Specimen: BLOOD LEFT HAND  Result Value Ref Range Status   Specimen Description   Final    BLOOD LEFT HAND Performed at [facility], [address], [place], [place] [address]    Special Requests   Final    BOTTLES DRAWN AEROBIC AND ANAEROBIC Blood Culture adequate volume Performed at [facility], [address] [place], [place] [address]    Culture   Final    NO GROWTH 2 DAYS Performed at [facility], [address], [place], [place] [address]    Report Status PENDING  Incomplete  Blood culture (routine x 2)     Status: None (Preliminary result)   Collection Time: 10/09/22  9:33 PM   Specimen: BLOOD RIGHT HAND  Result Value Ref  Range Status   Specimen Description   Final    BLOOD RIGHT HAND Performed at [facility], [address] [place], [place] [address]    Special Requests   Final    BOTTLES DRAWN AEROBIC AND ANAEROBIC Blood Culture adequate volume Performed at [facility], [address] [place], [place] [address]    Culture   Final    NO GROWTH 2 DAYS Performed at [facility], [address], [place], [place] [address]    Report Status PENDING  Incomplete  Respiratory (~20 pathogens) panel by PCR     Status: None   Collection Time: 10/10/22  3:10 AM   Specimen: Nasopharyngeal Swab; Respiratory  Result Value Ref Range Status   Adenovirus NOT DETECTED NOT DETECTED Final   Coronavirus 229E NOT DETECTED NOT DETECTED Final    Comment: (NOTE) The Coronavirus on the Respiratory Panel, DOES NOT test for the novel  Coronavirus (2019 nCoV)    Coronavirus HKU1 NOT DETECTED NOT DETECTED Final   Coronavirus NL63 NOT DETECTED NOT DETECTED Final   Coronavirus OC43 NOT DETECTED NOT DETECTED Final   Metapneumovirus NOT DETECTED NOT DETECTED Final   Rhinovirus / Enterovirus NOT DETECTED NOT DETECTED Final   Influenza A NOT DETECTED NOT DETECTED Final   Influenza B NOT DETECTED NOT DETECTED Final   Parainfluenza Virus 1 NOT DETECTED NOT DETECTED Final   Parainfluenza Virus 2 NOT DETECTED NOT DETECTED Final   Parainfluenza Virus 3 NOT DETECTED NOT DETECTED Final   Parainfluenza Virus 4 NOT DETECTED NOT DETECTED Final   Respiratory Syncytial Virus NOT DETECTED NOT DETECTED Final   Bordetella pertussis NOT DETECTED NOT DETECTED Final   Bordetella Parapertussis NOT DETECTED NOT DETECTED Final   Chlamydophila pneumoniae NOT DETECTED NOT DETECTED Final   Mycoplasma pneumoniae NOT DETECTED NOT  DETECTED Final    Comment: Performed at [facility], [address], [place], [place] [address]  Expectorated Sputum Assessment w Gram Stain, Rflx to Resp Cult     Status: None   Collection  Time: 10/10/22  6:30 AM   Specimen: Expectorated Sputum  Result Value Ref Range Status   Specimen Description EXPECTORATED SPUTUM  Final   Special Requests Immunocompromised  Final   Sputum evaluation   Final    THIS SPECIMEN IS ACCEPTABLE FOR SPUTUM CULTURE Performed at [facility], [address], [place], [place] [address]    Report Status 10/10/2022 FINAL  Final  Culture, Respiratory w Gram Stain     Status: None (Preliminary result)   Collection Time: 10/10/22  6:30 AM  Result Value Ref Range Status   Specimen Description EXPECTORATED SPUTUM  Final   Special Requests Immunocompromised Reflexed from [id number]  Final   Gram Stain   Final    MODERATE WBC SEEN FEW SQUAMOUS EPITHELIAL CELLS PRESENT MODERATE GRAM POSITIVE COCCI RARE GRAM POSITIVE RODS RARE GRAM NEGATIVE RODS    Culture   Final    CULTURE REINCUBATED FOR BETTER GROWTH Performed at [facility], [address] [place], [place] [address]    Report Status PENDING  Incomplete  MRSA Next Gen by PCR, Nasal     Status: None   Collection Time: 10/10/22  5:18 PM   Specimen: Nasal Mucosa; Nasal Swab  Result Value Ref Range Status   MRSA by PCR Next Gen NOT DETECTED NOT DETECTED Final    Comment: (NOTE) The GeneXpert MRSA Assay (FDA approved for NASAL specimens only), is one component of a comprehensive MRSA colonization surveillance program. It is not intended to diagnose MRSA infection nor to guide or monitor treatment for MRSA infections. Test performance is not FDA approved in patients less than 54 years old. Performed at [facility], [address], [place], [place] [address]          Radiology Studies: DG CHEST PORT 1 VIEW  Result Date: 10/11/2022 CLINICAL DATA:  Shortness of breath and wheezing.  Pneumonia. EXAM: PORTABLE CHEST 1 VIEW COMPARISON:  10/09/2022 FINDINGS: Widespread bilateral patchy pulmonary infiltrates persist and are slightly radiographically worsened. No detectable  effusion. No qualitatively new finding. IMPRESSION: Widespread bilateral patchy pulmonary infiltrates, slightly worsened. Electronically Signed   By: Paulina Fusi M.D.   On: 10/11/2022 12:42        Scheduled Meds:  ARIPiprazole  30 mg Oral Daily   atorvastatin  10 mg Oral Daily   buPROPion  450 mg Oral Daily   cinacalcet  30 mg Oral Q breakfast   doxycycline  100 mg Oral Q12H   folic acid  1 mg Oral Daily   furosemide  40 mg Intravenous Q12H   guaiFENesin  1,200 mg Oral BID   heparin  5,000 Units Subcutaneous Q8H   hydrOXYzine  25 mg Oral QHS   insulin aspart  0-9 Units Subcutaneous TID WC   insulin pump   Subcutaneous TID WC, HS, 0200   loratadine  10 mg Oral Daily   montelukast  10 mg Oral QHS   multivitamins with iron  1 tablet Oral Daily   mycophenolate  360 mg Oral BID   pantoprazole  40 mg Oral Daily   predniSONE  40 mg Oral Q breakfast   sodium bicarbonate  1,300 mg Oral BID   tacrolimus  4 mg Oral QHS   tacrolimus  5  mg Oral Daily   Continuous Infusions:  sodium chloride 10 mL/hr at 10/09/22 2209   ceFEPime (MAXIPIME) IV 2 g (10/12/22 0014)     LOS: 2 days    Time spent: 40 minutes    Ramiro Harvest, MD [facility]   To contact the attending provider between 7A-7P or the covering provider during after hours 7P-7A, please log into the web site [email or web address] and access using universal [place] password for that web site. If you do not have the password, please call the hospital operator.  10/12/2022, 10:39 AM

## 2022-10-12 NOTE — Progress Notes (Signed)
Nausea and vomiting at this time. Notified Lindajo Royal, RN. Awaiting new orders.

## 2022-10-12 NOTE — Progress Notes (Addendum)
eLink Physician-Brief Progress Note Patient Name: Linda Ellison DOB: August 01, 1975 MRN: [id number]   Date of Service  10/12/2022  HPI/Events of Note  Nausea/vomiting, Qtc 630  eICU Interventions  In the setting of prolonged qTC, Ativan PRN for Nausea   2315 -trend till, stable transition off of IV insulin.  Will initiate long-acting and short acting insulin.  Patient has an insulin pump which she will need to transition to prior to discharge. 0240 -persistent hypertension up to 170s-180s systolic.  Add on IV hydralazine/labetalol given ongoing nausea/vomiting  0325 -patient's work of breathing and respiratory rate have increased, pulmonary bedside.  I had a conversation with the patient who was able to produce full sentences but she does display increased nasal flaring, increased retractions on 35 L of oxygen.  High flow nasal cannula works more effectively by virtue of having higher flow-would encourage we utilize flows of 45-55 L while using a heated high flow device.  For now, would initiate BiPAP therapy and utilize heated high flow for rest intermittently.  Obtain chest radiograph also.  44 -continues to decline despite BiPAP.  Now respiratory rates squarely around 50.  Getting volumes of 600s on BiPAP 15/8.  Chest radiograph with worsening essentially diffuse bilateral opacities, seems like a progression from earlier CT.  Will request ground team evaluation.  Fortunately oxygenation is improved.  Intervention Category Minor Interventions: Routine modifications to care plan (e.g. PRN medications for pain, fever)  Reggie Bise 10/12/2022, 10:20 PM

## 2022-10-12 NOTE — Progress Notes (Signed)
[facility] Progress Note   Subjective:  Dyspnea worse, frequent coughing.  CT scan pending, transferring to ICU with pulm +3.4L by I/Os for admission. UOP recorded yesterday; rec'd lasix 40 IV x 1.   Objective Vitals:   10/11/22 1610 10/11/22 2049 10/12/22 0504 10/12/22 1012  BP: (!) 144/73 (!) 149/75 (!) 149/74 (!) 150/78  Pulse: 94 93 93 92  Resp: 18 (!) 24 (!) 22 18  Temp: 98.2 F (36.8 C) (!) 97.4 F (36.3 C)  (!) 97.5 F (36.4 C)  TempSrc:  Oral  Oral  SpO2: (!) 81% (!) 87% 97% (!) 68%  Weight:      Height:       Physical Exam General appearance: uncomfortable due to tachypnea Head: Normocephalic, without obvious abnormality, atraumatic Resp: ^ WOB, tachypnea, rhonchi throughout Cardio: regular rate and rhythm, S1, S2 normal, no murmur, click, rub or gallop GI: soft, non-tender; bowel sounds normal; no masses,  no organomegaly and renal allograft in RLQ, no tenderness or bruits. Extremities: edema trace pretibial edema bilaterally  Additional Objective Labs: Basic Metabolic Panel: Recent Labs  Lab 10/11/22 0857 10/12/22 0318 10/12/22 0714  NA 133* 133* 134*  K 4.4 6.9* 5.7*  CL 99 100 100  CO2 18* 15* 18*  GLUCOSE 236* 365* 332*  BUN 72* 87* 88*  CREATININE 2.21* 2.20* 2.24*  CALCIUM 7.5* 7.8* 7.7*  PHOS 4.3 4.8*  --     Liver Function Tests: Recent Labs  Lab 10/10/22 0216 10/11/22 0857 10/12/22 0318  AST 13*  --   --   ALT 17  --   --   ALKPHOS 180*  --   --   BILITOT 0.6  --   --   PROT 6.8  --   --   ALBUMIN 2.6* 2.6* 2.6*    No results for input(s): "LIPASE", "AMYLASE" in the last 168 hours. CBC: Recent Labs  Lab 10/09/22 2009 10/10/22 0216 10/11/22 0857 10/12/22 0318  WBC 16.9* 16.9* 26.9* 25.3*  NEUTROABS 13.5*  --  23.9* 22.5*  HGB 9.9* 9.6* 10.3* 10.5*  HCT 29.8* 29.3* 31.9* 32.5*  MCV 77.0* 77.3* 81.4 80.8  PLT 365 351 411* 397    Blood Culture    Component Value Date/Time   SDES EXPECTORATED SPUTUM  10/10/2022 0630   SDES EXPECTORATED SPUTUM 10/10/2022 0630   SPECREQUEST Immunocompromised 10/10/2022 0630   SPECREQUEST Immunocompromised Reflexed from [id number] 10/10/2022 0630   CULT  10/10/2022 0630    CULTURE REINCUBATED FOR BETTER GROWTH Performed at [facility], [address], [place], [place] [address]    REPTSTATUS 10/10/2022 FINAL 10/10/2022 0630   REPTSTATUS PENDING 10/10/2022 0630    Cardiac Enzymes: No results for input(s): "CKTOTAL", "CKMB", "CKMBINDEX", "TROPONINI" in the last 168 hours. CBG: Recent Labs  Lab 10/11/22 1131 10/11/22 1644 10/11/22 2144 10/12/22 0207 10/12/22 0723  GLUCAP 270* 379* 358* 353* 304*    Iron Studies:  Recent Labs    10/10/22 0216  IRON 14*  TIBC 186*    @lablastinr3 @ Studies/Results: DG CHEST PORT 1 VIEW  Result Date: 10/11/2022 CLINICAL DATA:  Shortness of breath and wheezing.  Pneumonia. EXAM: PORTABLE CHEST 1 VIEW COMPARISON:  10/09/2022 FINDINGS: Widespread bilateral patchy pulmonary infiltrates persist and are slightly radiographically worsened. No detectable effusion. No qualitatively new finding. IMPRESSION: Widespread bilateral patchy pulmonary infiltrates, slightly worsened. Electronically Signed   By: Paulina Fusi M.D.   On: 10/11/2022 12:42   Medications:  sodium chloride 10 mL/hr at 10/09/22  2209   ceFEPime (MAXIPIME) IV 2 g (10/12/22 0014)    ARIPiprazole  30 mg Oral Daily   atorvastatin  10 mg Oral Daily   buPROPion  450 mg Oral Daily   cinacalcet  30 mg Oral Q breakfast   doxycycline  100 mg Oral Q12H   folic acid  1 mg Oral Daily   furosemide  40 mg Intravenous Q12H   guaiFENesin  1,200 mg Oral BID   heparin  5,000 Units Subcutaneous Q8H   hydrOXYzine  25 mg Oral QHS   insulin aspart  0-9 Units Subcutaneous TID WC   insulin pump   Subcutaneous TID WC, HS, 0200   loratadine  10 mg Oral Daily   montelukast  10 mg Oral QHS   multivitamins with iron  1 tablet Oral Daily   mycophenolate  360 mg Oral BID    pantoprazole  40 mg Oral Daily   predniSONE  40 mg Oral Q breakfast   sodium bicarbonate  1,300 mg Oral BID   sodium zirconium cyclosilicate  10 g Oral Once   tacrolimus  4 mg Oral QHS   tacrolimus  5 mg Oral Daily    Assessment/Plan:  AKI/CKD stage IIIa following DDKT- Baseline in the 1.4-1.6 range it appears. Admission Cr 5 presumably due to ischemic ATN in setting of poor po intake and diarrhea for the past 5-6 days.  Scr has improved to 2.2 and stable c/w yesterday with IVF's supporting prerenal etiology.  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    Avoid nephrotoxic medications including NSAIDs and iodinated intravenous contrast exposure unless the latter is absolutely indicated.  Preferred narcotic agents for pain control are hydromorphone, fentanyl, and methadone. Morphine should not be used. Avoid Baclofen and avoid oral sodium phosphate and magnesium citrate based laxatives / bowel preps. Continue strict Input and Output monitoring. Will monitor the patient closely with you and intervene or adjust therapy as indicated by changes in clinical status/labs  Acute hypoxic respiratory failure with acute asthma exacerbation  + pneumonia - antibiotics per primary svc.  On prednisone as well.   Worsening status in past 24h.  Non contrasted CT chest pending, pulm c/s --> moving to ICU. Low threshhold for bronchoscopy given immunocompromised status.  Immunosuppressive therapy - normally on myfortic 720 mg bid, prograf 5 mg qam and 4 mg qpm, and prednisone 5 mg.  Agree with decreasing myfortic to 360 mg bid.  Tac level pending 7/7 at 21:50 - appears to have been appropriately collected.  AGMA - due to #1 and diarrhea.  S/p LR and on po bicarb now - acceptable Hypokalemia - normalized but now hyperkalemic and given lokelma.   Hypomagnesemia - improved s/p supplement CAD - not an acute issue. DM type 2 - per primary svc Anemia of CKD stage III - continue to follow and transfuse for Hgb  <7  Remain inpatient for now.  Will follow closely.   Estill Bakes MD 10/12/2022, 10:12 AM  [facility] Pager: [phone]

## 2022-10-12 NOTE — Progress Notes (Signed)
Pharmacy Antibiotic Note  Linda Ellison is a 47 y.o. female admitted on 10/09/2022 with bilateral multifocal pneumonia.  Pharmacy has been consulted for Zosyn dosing for aspiration PNA.  Plan: Zosyn 3.375gm IV q8h (4hr extended infusions) Follow up renal function, cultures, clinical course, duration of therapy  Height: 5' 6.5" (168.9 cm) Weight: 119 kg (262 lb 5.6 oz) IBW/kg (Calculated) : 60.45  Temp (24hrs), Avg:97.7 F (36.5 C), Min:97.4 F (36.3 C), Max:98.2 F (36.8 C)  Recent Labs  Lab 10/09/22 2009 10/10/22 0216 10/10/22 0636 10/11/22 0857 10/12/22 0318 10/12/22 0714  WBC 16.9* 16.9*  --  26.9* 25.3*  --   CREATININE 5.05* 4.24* 4.06* 2.21* 2.20* 2.24*    Estimated Creatinine Clearance: 41.6 mL/min (A) (by C-G formula based on SCr of 2.24 mg/dL (H)).    Allergies  Allergen Reactions   Ciprofloxacin Hives, Itching and Nausea And Vomiting   Fluocinolone Itching, Nausea And Vomiting and Other (See Comments)   Hydromorphone Hives   Strawberry Extract Itching and Other (See Comments)    Pt states that this medication makes her tongue raw.   No reaction   Hydromorphone Hcl Hives   Phenytoin Sodium Extended Itching   Adhesive [Tape] Itching and Rash   Chlorhexidine Gluconate Itching   Clindamycin Diarrhea, Nausea And Vomiting and Rash   Shrimp [Shellfish Allergy] Rash    Antimicrobials this admission: 7/7 Cefepime >> 7/9 7/7 Doxy >> 7/9 Zosyn >>  Dose adjustments this admission:  Microbiology results: 7/6 BCx: ngtd 7/7 Sputum: GNR, GPC, GNR - reincubating  Thank you for allowing pharmacy to be a part of this patient's care.  Loralee Pacas, PharmD, BCPS 10/12/2022 3:13 PM  Please check AMION for all [facility] Pharmacy phone numbers After 10:00 PM, call Main Pharmacy [phone]

## 2022-10-12 NOTE — Progress Notes (Signed)
  Echocardiogram 2D Echocardiogram has been performed.  Delcie Roch 10/12/2022, 3:01 PM

## 2022-10-13 ENCOUNTER — Inpatient Hospital Stay (HOSPITAL_COMMUNITY): Payer: Medicare (Managed Care)

## 2022-10-13 DIAGNOSIS — D849 Immunodeficiency, unspecified: Secondary | ICD-10-CM | POA: Diagnosis not present

## 2022-10-13 DIAGNOSIS — J189 Pneumonia, unspecified organism: Secondary | ICD-10-CM | POA: Diagnosis not present

## 2022-10-13 DIAGNOSIS — N179 Acute kidney failure, unspecified: Secondary | ICD-10-CM | POA: Diagnosis not present

## 2022-10-13 LAB — CBC
HCT: 30.9 % — ABNORMAL LOW (ref 36.0–46.0)
Hemoglobin: 10.1 g/dL — ABNORMAL LOW (ref 12.0–15.0)
MCH: 25.6 pg — ABNORMAL LOW (ref 26.0–34.0)
MCHC: 32.7 g/dL (ref 30.0–36.0)
MCV: 78.2 fL — ABNORMAL LOW (ref 80.0–100.0)
Platelets: 550 10*3/uL — ABNORMAL HIGH (ref 150–400)
RBC: 3.95 MIL/uL (ref 3.87–5.11)
RDW: 14.2 % (ref 11.5–15.5)
WBC: 33 10*3/uL — ABNORMAL HIGH (ref 4.0–10.5)
nRBC: 0.7 % — ABNORMAL HIGH (ref 0.0–0.2)

## 2022-10-13 LAB — POCT I-STAT 7, (LYTES, BLD GAS, ICA,H+H)
Acid-base deficit: 3 mmol/L — ABNORMAL HIGH (ref 0.0–2.0)
Acid-base deficit: 5 mmol/L — ABNORMAL HIGH (ref 0.0–2.0)
Bicarbonate: 19.7 mmol/L — ABNORMAL LOW (ref 20.0–28.0)
Bicarbonate: 21.4 mmol/L (ref 20.0–28.0)
Calcium, Ion: 0.96 mmol/L — ABNORMAL LOW (ref 1.15–1.40)
Calcium, Ion: 1.02 mmol/L — ABNORMAL LOW (ref 1.15–1.40)
HCT: 32 % — ABNORMAL LOW (ref 36.0–46.0)
HCT: 31 % — ABNORMAL LOW (ref 36.0–46.0)
Hemoglobin: 10.9 g/dL — ABNORMAL LOW (ref 12.0–15.0)
Hemoglobin: 10.5 g/dL — ABNORMAL LOW (ref 12.0–15.0)
O2 Saturation: 86 %
O2 Saturation: 85 %
Patient temperature: 99.2
Patient temperature: 98.5
Potassium: 4.7 mmol/L (ref 3.5–5.1)
Potassium: 4.9 mmol/L (ref 3.5–5.1)
Sodium: 137 mmol/L (ref 135–145)
Sodium: 137 mmol/L (ref 135–145)
TCO2: 20 mmol/L — ABNORMAL LOW (ref 22–32)
TCO2: 23 mmol/L (ref 22–32)
pCO2 arterial: 26.1 mmHg — ABNORMAL LOW (ref 32–48)
pCO2 arterial: 45 mmHg (ref 32–48)
pH, Arterial: 7.487 — ABNORMAL HIGH (ref 7.35–7.45)
pH, Arterial: 7.285 — ABNORMAL LOW (ref 7.35–7.45)
pO2, Arterial: 47 mmHg — ABNORMAL LOW (ref 83–108)
pO2, Arterial: 55 mmHg — ABNORMAL LOW (ref 83–108)

## 2022-10-13 LAB — GLUCOSE, CAPILLARY
Glucose-Capillary: 144 mg/dL — ABNORMAL HIGH (ref 70–99)
Glucose-Capillary: 146 mg/dL — ABNORMAL HIGH (ref 70–99)
Glucose-Capillary: 149 mg/dL — ABNORMAL HIGH (ref 70–99)
Glucose-Capillary: 176 mg/dL — ABNORMAL HIGH (ref 70–99)
Glucose-Capillary: 180 mg/dL — ABNORMAL HIGH (ref 70–99)
Glucose-Capillary: 211 mg/dL — ABNORMAL HIGH (ref 70–99)
Glucose-Capillary: 212 mg/dL — ABNORMAL HIGH (ref 70–99)
Glucose-Capillary: 253 mg/dL — ABNORMAL HIGH (ref 70–99)
Glucose-Capillary: 266 mg/dL — ABNORMAL HIGH (ref 70–99)
Glucose-Capillary: 274 mg/dL — ABNORMAL HIGH (ref 70–99)
Glucose-Capillary: 277 mg/dL — ABNORMAL HIGH (ref 70–99)
Glucose-Capillary: 292 mg/dL — ABNORMAL HIGH (ref 70–99)
Glucose-Capillary: 295 mg/dL — ABNORMAL HIGH (ref 70–99)
Glucose-Capillary: 306 mg/dL — ABNORMAL HIGH (ref 70–99)
Glucose-Capillary: 314 mg/dL — ABNORMAL HIGH (ref 70–99)

## 2022-10-13 LAB — RENAL FUNCTION PANEL
Albumin: 2.5 g/dL — ABNORMAL LOW (ref 3.5–5.0)
Anion gap: 14 (ref 5–15)
BUN: 93 mg/dL — ABNORMAL HIGH (ref 6–20)
CO2: 20 mmol/L — ABNORMAL LOW (ref 22–32)
Calcium: 7.7 mg/dL — ABNORMAL LOW (ref 8.9–10.3)
Chloride: 101 mmol/L (ref 98–111)
Creatinine, Ser: 2.14 mg/dL — ABNORMAL HIGH (ref 0.44–1.00)
GFR, Estimated: 28 mL/min — ABNORMAL LOW (ref >60)
Glucose, Bld: 148 mg/dL — ABNORMAL HIGH (ref 70–99)
Phosphorus: 3.9 mg/dL (ref 2.5–4.6)
Potassium: 4.4 mmol/L (ref 3.5–5.1)
Sodium: 135 mmol/L (ref 135–145)

## 2022-10-13 LAB — PHOSPHORUS: Phosphorus: 6.1 mg/dL — ABNORMAL HIGH (ref 2.5–4.6)

## 2022-10-13 LAB — PROCALCITONIN: Procalcitonin: 0.51 ng/mL

## 2022-10-13 LAB — MAGNESIUM: Magnesium: 1.9 mg/dL (ref 1.7–2.4)

## 2022-10-13 LAB — CULTURE, RESPIRATORY W GRAM STAIN

## 2022-10-13 MED ORDER — INSULIN REGULAR(HUMAN) IN NACL 100-0.9 UT/100ML-% IV SOLN
INTRAVENOUS | Status: DC
  Administered 2022-10-13: 14 [IU]/h via INTRAVENOUS
  Administered 2022-10-13: 27 [IU]/h via INTRAVENOUS
  Administered 2022-10-14: 21 [IU]/h via INTRAVENOUS
  Administered 2022-10-14: 22 [IU]/h via INTRAVENOUS
  Administered 2022-10-14: 9 [IU]/h via INTRAVENOUS
  Administered 2022-10-14: 9.5 [IU]/h via INTRAVENOUS
  Administered 2022-10-15: 6.5 [IU]/h via INTRAVENOUS
  Administered 2022-10-15: 10 [IU]/h via INTRAVENOUS
  Administered 2022-10-15: 17 [IU]/h via INTRAVENOUS
  Administered 2022-10-16: 11.5 [IU]/h via INTRAVENOUS
  Administered 2022-10-16: 13 [IU]/h via INTRAVENOUS
  Administered 2022-10-17: 8.5 [IU]/h via INTRAVENOUS
  Administered 2022-10-17: 10 [IU]/h via INTRAVENOUS
  Administered 2022-10-18: 3.2 [IU]/h via INTRAVENOUS
  Administered 2022-10-19: 3.8 [IU]/h via INTRAVENOUS
  Filled 2022-10-13 (×15): qty 100

## 2022-10-13 MED ORDER — ORAL CARE MOUTH RINSE
15.0000 mL | OROMUCOSAL | Status: DC
  Administered 2022-10-13 – 2022-10-26 (×160): 15 mL via OROMUCOSAL

## 2022-10-13 MED ORDER — AMLODIPINE BESYLATE 10 MG PO TABS
10.0000 mg | ORAL_TABLET | Freq: Every day | ORAL | Status: DC
  Administered 2022-10-13 – 2022-10-16 (×4): 10 mg
  Filled 2022-10-13 (×4): qty 1

## 2022-10-13 MED ORDER — TACROLIMUS 1 MG/ML ORAL SUSPENSION: 4.0000 mg | Freq: Every day | ORAL | Status: DC

## 2022-10-13 MED ORDER — DOCUSATE SODIUM 50 MG/5ML PO LIQD
100.0000 mg | Freq: Two times a day (BID) | ORAL | Status: DC
  Administered 2022-10-13 – 2022-10-17 (×10): 100 mg
  Filled 2022-10-13 (×11): qty 10

## 2022-10-13 MED ORDER — TACROLIMUS 1 MG PO CAPS
3.0000 mg | ORAL_CAPSULE | Freq: Every day | ORAL | Status: DC
  Administered 2022-10-13: 3 mg via SUBLINGUAL
  Filled 2022-10-13 (×2): qty 3

## 2022-10-13 MED ORDER — FENTANYL BOLUS VIA INFUSION
50.0000 ug | INTRAVENOUS | Status: DC | PRN
  Administered 2022-10-13 (×3): 50 ug via INTRAVENOUS
  Administered 2022-10-13 – 2022-10-14 (×3): 100 ug via INTRAVENOUS
  Administered 2022-10-15 (×3): 50 ug via INTRAVENOUS
  Administered 2022-10-16 – 2022-10-22 (×27): 100 ug via INTRAVENOUS

## 2022-10-13 MED ORDER — REVEFENACIN 175 MCG/3ML IN SOLN
175.0000 ug | Freq: Every day | RESPIRATORY_TRACT | Status: DC
  Administered 2022-10-13 – 2022-11-07 (×26): 175 ug via RESPIRATORY_TRACT
  Filled 2022-10-13 (×26): qty 3

## 2022-10-13 MED ORDER — LORATADINE 10 MG PO TABS
10.0000 mg | ORAL_TABLET | Freq: Every day | ORAL | Status: DC
  Administered 2022-10-13 – 2022-10-23 (×11): 10 mg
  Filled 2022-10-13 (×11): qty 1

## 2022-10-13 MED ORDER — CALCIUM GLUCONATE-NACL 2-0.675 GM/100ML-% IV SOLN
2.0000 g | Freq: Once | INTRAVENOUS | Status: AC
  Administered 2022-10-13: 2000 mg via INTRAVENOUS
  Filled 2022-10-13: qty 100

## 2022-10-13 MED ORDER — ATORVASTATIN CALCIUM 10 MG PO TABS
10.0000 mg | ORAL_TABLET | Freq: Every day | ORAL | Status: DC
  Administered 2022-10-13 – 2022-10-27 (×15): 10 mg
  Filled 2022-10-13 (×15): qty 1

## 2022-10-13 MED ORDER — MONTELUKAST SODIUM 10 MG PO TABS
10.0000 mg | ORAL_TABLET | Freq: Every day | ORAL | Status: DC
  Administered 2022-10-13 – 2022-10-26 (×13): 10 mg
  Filled 2022-10-13 (×13): qty 1

## 2022-10-13 MED ORDER — SODIUM BICARBONATE 650 MG PO TABS
1300.0000 mg | ORAL_TABLET | Freq: Two times a day (BID) | ORAL | Status: DC
  Administered 2022-10-13 – 2022-10-19 (×13): 1300 mg
  Filled 2022-10-13 (×13): qty 2

## 2022-10-13 MED ORDER — SULFAMETHOXAZOLE-TRIMETHOPRIM 400-80 MG/5ML IV SOLN
550.0000 mg | Freq: Four times a day (QID) | INTRAVENOUS | Status: DC
  Administered 2022-10-13 – 2022-10-15 (×8): 550 mg via INTRAVENOUS
  Filled 2022-10-13 (×10): qty 34.38

## 2022-10-13 MED ORDER — INSULIN GLARGINE-YFGN 100 UNIT/ML ~~LOC~~ SOLN: 30.0000 [IU] | Freq: Every day | SUBCUTANEOUS | Status: DC

## 2022-10-13 MED ORDER — GUAIFENESIN 100 MG/5ML PO LIQD: 5.0000 mL | ORAL | Status: DC | PRN

## 2022-10-13 MED ORDER — LABETALOL HCL 5 MG/ML IV SOLN
10.0000 mg | INTRAVENOUS | Status: DC | PRN
  Administered 2022-10-13 – 2022-10-29 (×16): 10 mg via INTRAVENOUS
  Filled 2022-10-13 (×16): qty 4

## 2022-10-13 MED ORDER — ETOMIDATE 2 MG/ML IV SOLN
20.0000 mg | Freq: Once | INTRAVENOUS | Status: AC
  Administered 2022-10-13: 20 mg via INTRAVENOUS
  Filled 2022-10-13: qty 10

## 2022-10-13 MED ORDER — ORAL CARE MOUTH RINSE: 15.0000 mL | OROMUCOSAL | Status: DC | PRN

## 2022-10-13 MED ORDER — ROCURONIUM BROMIDE 50 MG/5ML IV SOLN
100.0000 mg | Freq: Once | INTRAVENOUS | Status: DC
  Filled 2022-10-13: qty 10

## 2022-10-13 MED ORDER — FENTANYL CITRATE PF 50 MCG/ML IJ SOSY
PREFILLED_SYRINGE | INTRAMUSCULAR | Status: AC
  Administered 2022-10-13: 100 ug
  Filled 2022-10-13: qty 4

## 2022-10-13 MED ORDER — FUROSEMIDE 10 MG/ML IJ SOLN
60.0000 mg | Freq: Once | INTRAMUSCULAR | Status: AC
  Administered 2022-10-13: 60 mg via INTRAVENOUS
  Filled 2022-10-13: qty 6

## 2022-10-13 MED ORDER — FENTANYL 2500MCG IN NS 250ML (10MCG/ML) PREMIX INFUSION
0.0000 ug/h | INTRAVENOUS | Status: DC
  Administered 2022-10-13: 150 ug/h via INTRAVENOUS
  Administered 2022-10-13: 50 ug/h via INTRAVENOUS
  Administered 2022-10-14 (×3): 400 ug/h via INTRAVENOUS
  Administered 2022-10-14: 350 ug/h via INTRAVENOUS
  Administered 2022-10-15 – 2022-10-17 (×12): 400 ug/h via INTRAVENOUS
  Administered 2022-10-18: 200 ug/h via INTRAVENOUS
  Administered 2022-10-18: 400 ug/h via INTRAVENOUS
  Administered 2022-10-18: 200 ug/h via INTRAVENOUS
  Administered 2022-10-19: 300 ug/h via INTRAVENOUS
  Administered 2022-10-19: 100 ug/h via INTRAVENOUS
  Filled 2022-10-13 (×23): qty 250

## 2022-10-13 MED ORDER — MIDAZOLAM HCL 2 MG/2ML IJ SOLN
INTRAMUSCULAR | Status: AC
  Administered 2022-10-13: 2 mg
  Filled 2022-10-13: qty 4

## 2022-10-13 MED ORDER — BUPROPION HCL 75 MG PO TABS
225.0000 mg | ORAL_TABLET | Freq: Two times a day (BID) | ORAL | Status: DC
  Administered 2022-10-13 – 2022-10-27 (×28): 225 mg
  Filled 2022-10-13 (×32): qty 3

## 2022-10-13 MED ORDER — HYDRALAZINE HCL 20 MG/ML IJ SOLN
10.0000 mg | INTRAMUSCULAR | Status: DC | PRN
  Administered 2022-10-13 – 2022-10-31 (×26): 10 mg via INTRAVENOUS
  Filled 2022-10-13 (×26): qty 1

## 2022-10-13 MED ORDER — MIDAZOLAM HCL 2 MG/2ML IJ SOLN: 2.0000 mg | Freq: Once | INTRAMUSCULAR | Status: DC

## 2022-10-13 MED ORDER — ACETAMINOPHEN 160 MG/5ML PO SOLN: 650.0000 mg | ORAL | Status: DC | PRN

## 2022-10-13 MED ORDER — PHENYLEPHRINE 80 MCG/ML (10ML) SYRINGE FOR IV PUSH (FOR BLOOD PRESSURE SUPPORT)
PREFILLED_SYRINGE | INTRAVENOUS | Status: AC
  Filled 2022-10-13: qty 10

## 2022-10-13 MED ORDER — FAMOTIDINE 20 MG PO TABS
20.0000 mg | ORAL_TABLET | Freq: Every day | ORAL | Status: DC
  Administered 2022-10-13 – 2022-10-26 (×14): 20 mg
  Filled 2022-10-13 (×14): qty 1

## 2022-10-13 MED ORDER — TACROLIMUS 1 MG/ML ORAL SUSPENSION
5.0000 mg | Freq: Every day | ORAL | Status: DC
  Filled 2022-10-13: qty 5

## 2022-10-13 MED ORDER — PROPOFOL 1000 MG/100ML IV EMUL
0.0000 ug/kg/min | INTRAVENOUS | Status: DC
  Administered 2022-10-13 (×2): 50 ug/kg/min via INTRAVENOUS
  Administered 2022-10-13: 5 ug/kg/min via INTRAVENOUS
  Administered 2022-10-13 (×3): 50 ug/kg/min via INTRAVENOUS
  Administered 2022-10-13: 45 ug/kg/min via INTRAVENOUS
  Filled 2022-10-13 (×8): qty 100

## 2022-10-13 MED ORDER — FENTANYL CITRATE PF 50 MCG/ML IJ SOSY: 100.0000 ug | PREFILLED_SYRINGE | Freq: Once | INTRAMUSCULAR | Status: DC

## 2022-10-13 MED ORDER — PREDNISONE 20 MG PO TABS
40.0000 mg | ORAL_TABLET | Freq: Every day | ORAL | Status: DC
  Administered 2022-10-13: 40 mg
  Filled 2022-10-13: qty 2

## 2022-10-13 MED ORDER — DOXYCYCLINE HYCLATE 100 MG PO TABS: 100.0000 mg | ORAL_TABLET | Freq: Two times a day (BID) | ORAL | Status: DC

## 2022-10-13 MED ORDER — VITAL AF 1.2 CAL PO LIQD
1000.0000 mL | ORAL | Status: DC
  Administered 2022-10-13: 1000 mL

## 2022-10-13 MED ORDER — ADULT MULTIVITAMIN W/MINERALS CH
1.0000 | ORAL_TABLET | Freq: Every day | ORAL | Status: DC
  Administered 2022-10-13 – 2022-10-27 (×15): 1
  Filled 2022-10-13 (×15): qty 1

## 2022-10-13 MED ORDER — CINACALCET HCL 30 MG PO TABS
30.0000 mg | ORAL_TABLET | Freq: Every day | ORAL | Status: DC
  Filled 2022-10-13: qty 1

## 2022-10-13 MED ORDER — ARIPIPRAZOLE 10 MG PO TABS
30.0000 mg | ORAL_TABLET | Freq: Every day | ORAL | Status: DC
  Administered 2022-10-13 – 2022-10-27 (×15): 30 mg
  Filled 2022-10-13 (×15): qty 3

## 2022-10-13 MED ORDER — INSULIN ASPART 100 UNIT/ML IJ SOLN
0.0000 [IU] | INTRAMUSCULAR | Status: DC
  Administered 2022-10-13: 5 [IU] via SUBCUTANEOUS

## 2022-10-13 MED ORDER — TAB-A-VITE/IRON PO TABS
1.0000 | ORAL_TABLET | Freq: Every day | ORAL | Status: DC
  Filled 2022-10-13: qty 1

## 2022-10-13 MED ORDER — POLYETHYLENE GLYCOL 3350 17 G PO PACK
17.0000 g | PACK | Freq: Every day | ORAL | Status: DC
  Administered 2022-10-13 – 2022-10-14 (×2): 17 g
  Filled 2022-10-13 (×2): qty 1

## 2022-10-13 MED ORDER — MIDAZOLAM HCL 2 MG/2ML IJ SOLN
2.0000 mg | INTRAMUSCULAR | Status: DC | PRN
  Administered 2022-10-13 – 2022-10-20 (×8): 2 mg via INTRAVENOUS
  Administered 2022-10-20: 1 mg via INTRAVENOUS
  Administered 2022-10-20 – 2022-10-22 (×5): 2 mg via INTRAVENOUS
  Filled 2022-10-13 (×15): qty 2

## 2022-10-13 MED ORDER — FENTANYL CITRATE PF 50 MCG/ML IJ SOSY: 50.0000 ug | PREFILLED_SYRINGE | Freq: Once | INTRAMUSCULAR | Status: DC

## 2022-10-13 MED ORDER — ETOMIDATE 2 MG/ML IV SOLN
INTRAVENOUS | Status: AC
  Filled 2022-10-13: qty 20

## 2022-10-13 MED ORDER — TACROLIMUS 1 MG PO CAPS
2.0000 mg | ORAL_CAPSULE | Freq: Every day | ORAL | Status: DC
  Administered 2022-10-13: 2 mg via SUBLINGUAL
  Filled 2022-10-13: qty 2

## 2022-10-13 MED ORDER — ROCURONIUM BROMIDE 10 MG/ML (PF) SYRINGE
PREFILLED_SYRINGE | INTRAVENOUS | Status: AC
  Administered 2022-10-13: 100 mg
  Filled 2022-10-13: qty 10

## 2022-10-13 MED ORDER — INSULIN GLARGINE-YFGN 100 UNIT/ML ~~LOC~~ SOLN
12.0000 [IU] | Freq: Once | SUBCUTANEOUS | Status: AC
  Administered 2022-10-13: 12 [IU] via SUBCUTANEOUS
  Filled 2022-10-13: qty 0.12

## 2022-10-13 MED ORDER — SULFAMETHOXAZOLE-TRIMETHOPRIM 400-80 MG/5ML IV SOLN: 1000.0000 mg | Freq: Four times a day (QID) | INTRAVENOUS | Status: DC

## 2022-10-13 MED ORDER — PREDNISONE 20 MG PO TABS
40.0000 mg | ORAL_TABLET | Freq: Two times a day (BID) | ORAL | Status: DC
  Administered 2022-10-13 – 2022-10-14 (×2): 40 mg
  Filled 2022-10-13 (×2): qty 2

## 2022-10-13 MED ORDER — FOLIC ACID 1 MG PO TABS
1.0000 mg | ORAL_TABLET | Freq: Every day | ORAL | Status: DC
  Administered 2022-10-13 – 2022-10-27 (×15): 1 mg
  Filled 2022-10-13 (×15): qty 1

## 2022-10-13 MED ORDER — GUAIFENESIN 200 MG PO TABS
400.0000 mg | ORAL_TABLET | ORAL | Status: DC
  Administered 2022-10-13 – 2022-10-27 (×80): 400 mg
  Filled 2022-10-13 (×90): qty 2

## 2022-10-13 MED ORDER — ROCURONIUM BROMIDE 10 MG/ML (PF) SYRINGE
100.0000 mg | PREFILLED_SYRINGE | Freq: Once | INTRAVENOUS | Status: AC
  Administered 2022-10-13: 100 mg via INTRAVENOUS
  Filled 2022-10-13: qty 10

## 2022-10-13 MED ORDER — HYDROXYZINE HCL 25 MG PO TABS
25.0000 mg | ORAL_TABLET | Freq: Every day | ORAL | Status: DC
  Administered 2022-10-13 – 2022-10-26 (×13): 25 mg
  Filled 2022-10-13 (×13): qty 1

## 2022-10-13 MED ORDER — DEXTROSE 50 % IV SOLN: 0.0000 mL | INTRAVENOUS | Status: DC | PRN

## 2022-10-13 MED ORDER — ARFORMOTEROL TARTRATE 15 MCG/2ML IN NEBU
15.0000 ug | INHALATION_SOLUTION | Freq: Two times a day (BID) | RESPIRATORY_TRACT | Status: DC
  Administered 2022-10-13 – 2022-11-07 (×42): 15 ug via RESPIRATORY_TRACT
  Filled 2022-10-13 (×50): qty 2

## 2022-10-13 NOTE — Progress Notes (Signed)
OT Cancellation Note  Patient Details Name: Linda Ellison MRN: [id number] DOB: 13-Oct-1975   Cancelled Treatment:    Reason Eval/Treat Not Completed: Medical issues which prohibited therapy;Patient not medically ready (Now in ICU, intubated and on propofol)  Alyxandria Wentz D Causey 10/13/2022, 1:20 PM

## 2022-10-13 NOTE — Progress Notes (Signed)
Notified Lindajo Royal, RN of bp 178/86.

## 2022-10-13 NOTE — Progress Notes (Signed)
Dear Doctor:  This patient has been identified as a candidate for a central line for the following reason (s): poor veins/poor circulatory system (CHF, COPD, emphysema, diabetes, steroid use, IV drug abuse, etc.) If you agree, please write an order for the indicated device.   Thank you for supporting the early vascular access assessment program.

## 2022-10-13 NOTE — Progress Notes (Signed)
Patient started on BIPAP 14/8 75% and PRN albuterol treatment given. Patient reports that it feels better but her RR is still 45-50 bpm. Tried coaching patient on slow deep breaths but she continues to be tachypneic.

## 2022-10-13 NOTE — Inpatient Diabetes Management (Signed)
Inpatient Diabetes Program Recommendations  AACE/ADA: New Consensus Statement on Inpatient Glycemic Control (2015)  Target Ranges:  Prepandial:   less than 140 mg/dL      Peak postprandial:   less than 180 mg/dL (1-2 hours)      Critically ill patients:  140 - 180 mg/dL   Lab Results  Component Value Date   GLUCAP 212 (H) 10/13/2022   HGBA1C 6.7 (H) 08/27/2022    Review of Glycemic Control  Diabetes history: DM2 Outpatient Diabetes medications: Insulin pump with Novolog up to 100 units once daily, Trulicity 4.5 mg weekly Current orders for Inpatient glycemic control: Semglee 18 units every day, Novolog 2-6 q 4 hrs.  Inpatient Diabetes Program Recommendations:   Noted transition from IV insulin to subcutaneous. Please consider: -Increase Semglee to 30 units every day Consider add 12 units Semglee now and increase to 30 units in am.  Thank you, Darel Hong E. Fallon Haecker, RN, MSN, CDE  Diabetes Coordinator Inpatient Glycemic Control Team Team Pager [phone] (8am-5pm) 10/13/2022 8:33 AM

## 2022-10-13 NOTE — Progress Notes (Addendum)
Notified RT and Elink nurse of Rhonchi in all lobes and increased work of breathing. She is on HHFNC 35L/90% and sats 90%. Increased lethargy and sleepiness noted as well. RR 40s, HR 105 ST.   Nausea and vomiting subsided for now and have not been giving her PO meds b/c that may have been the reason for her nausea and vomiting earlier.

## 2022-10-13 NOTE — Progress Notes (Signed)
Initial Nutrition Assessment  DOCUMENTATION CODES:  Obesity unspecified  INTERVENTION:  Initiate tube feeding via OGT: Hold at trickle of 79mL/h until ok to advance per CCM Goal Vital 1.2 at 60 ml/h (1440 ml per day) Provides 1728 kcal, 108 gm protein, 1168 ml free water daily  NUTRITION DIAGNOSIS:   Inadequate oral intake related to inability to eat as evidenced by NPO status.  GOAL:   Provide needs based on ASPEN/SCCM guidelines  MONITOR:   TF tolerance, I & O's, Vent status, Labs  REASON FOR ASSESSMENT:   Rounds, Ventilator    ASSESSMENT:   Pt with hx of prior ESRD on HD now with renal transplant 2018, DM type 2, HTN, HLD, CAD, gastroparesis, and GERD presented to ED with weakness and SOB. Imaging suggestive of pneumonia.  7/6 - admitted 7/9 - worsening SOB, transferred to ICU 7/10 - intubated, bronchoscopy  Pt reported nausea, poor po intake, and diarrhea for 5-6 days prior to admission to nephrology team. Discussed in rounds, ok to start trickle feeds. No pressors at this time and propofol being used as a sedative.     Patient is currently intubated on ventilator support. OGT in place terminating at the pre-pyloric region. Family at bedside able to provide some nutrition hx. Reports that pt generally has a good appetite and her weight has appeared stable to them. Discussed the plan to start nutrition via OGT, no concerns at this time.   MV: 12.5 L/min Temp (24hrs), Avg:98.1 F (36.7 C), Min:96.5 F (35.8 C), Max:99.2 F (37.3 C)  Propofol: 35.6 ml/hr (940kcal/d)   Intake/Output Summary (Last 24 hours) at 10/13/2022 1302 Last data filed at 10/13/2022 1200 Gross per 24 hour  Intake 1734.96 ml  Output 200 ml  Net 1534.96 ml  Net IO Since Admission: 5,459.36 mL [10/13/22 1302]  Nutritionally Relevant Medications: Scheduled Meds:  atorvastatin  10 mg Per Tube Daily   docusate  100 mg Per Tube BID   famotidine  20 mg Per Tube Daily   folic acid  1 mg Per  Tube Daily   insulin aspart  0-9 Units Subcutaneous Q4H   [START ON 10/14/2022] insulin glargine-yfgn  30 Units Subcutaneous Daily   multivitamin with minerals  1 tablet Per Tube Daily   polyethylene glycol  17 g Per Tube Daily   predniSONE  40 mg Per Tube BID   sodium bicarbonate  1,300 mg Per Tube BID   sulfamethoxazole-trimethoprim  550 mg  Intravenous Q6H   Continuous Infusions:  piperacillin-tazobactam 12.5 mL/hr at 10/13/22 1100   propofol (DIPRIVAN) infusion 50 mcg/kg/min (10/13/22 1114)   Labs Reviewed: BUN 93, creatinine 2.14 CBG ranges from 144-277 mg/dL over the last 24 hours  NUTRITION - FOCUSED PHYSICAL EXAM: Flowsheet Row Most Recent Value  Orbital Region No depletion  Upper Arm Region No depletion  Thoracic and Lumbar Region No depletion  Buccal Region No depletion  Temple Region No depletion  Clavicle Bone Region No depletion  Clavicle and Acromion Bone Region No depletion  Scapular Bone Region No depletion  Dorsal Hand No depletion  Patellar Region No depletion  Anterior Thigh Region No depletion  Posterior Calf Region No depletion  Edema (RD Assessment) Moderate  Hair Reviewed  Eyes Reviewed  Mouth Reviewed  Skin Reviewed  Nails Reviewed    Diet Order:   Diet Order             Diet NPO time specified  Diet effective now  EDUCATION NEEDS:   Not appropriate for education at this time  Skin:  Skin Assessment: Reviewed RN Assessment  Last BM:  7/9  Height:   Ht Readings from Last 1 Encounters:  10/13/22 5\' 6"  (1.676 m)    Weight:   Wt Readings from Last 1 Encounters:  10/12/22 118.7 kg    Ideal Body Weight:  59.1 kg  BMI:  Body mass index is 42.24 kg/m.  Estimated Nutritional Needs:  Kcal:  1800-2100kcal/d Protein:  100-120g/d Fluid:  1.8L/d    Greig Castilla, RD, LDN Clinical Dietitian RD pager # available in AMION  After hours/weekend pager # available in [facility]

## 2022-10-13 NOTE — Progress Notes (Addendum)
At bedside and she is on bipap 75%. She remains tachypneic w/ RR 40s-50. Continuing to monitor Tomi Likens, RN w/ [facility] and requested order for ABG .  Contacted Radiology for CXR.

## 2022-10-13 NOTE — Progress Notes (Signed)
[name] Progress Note   Subjective:  Moved to ICU yesterday due to pulm status and intubated early this AM due to hypoxia and tachypnea. UOP yesterday.   Plans for bronch.  Objective Vitals:   10/13/22 0545 10/13/22 0600 10/13/22 0700 10/13/22 0715  BP:      Pulse: (!) 108 (!) 109 91 88  Resp: (!) 30 (!) 26 11 (!) 30  Temp: 99.2 F (37.3 C)     TempSrc: Axillary     SpO2: 98% 93% (!) 89% 99%  Weight:      Height:       Physical Exam General appearance: intubated and sedated Head: Normocephalic, without obvious abnormality, atraumatic Resp: on vent, rhonchi throughout Cardio: regular rate and rhythm, S1, S2 normal, no murmur, click, rub or gallop GI: soft, non-tender; bowel sounds normal; no masses,  no organomegaly and renal allograft in RLQ, no tenderness or bruits. Extremities: edema trace pretibial edema bilaterally  Additional Objective Labs: Basic Metabolic Panel: Recent Labs  Lab 10/11/22 0857 10/12/22 0318 10/12/22 0714 10/13/22 0129 10/13/22 0436 10/13/22 0641  NA 133* 133* 134* 135 137 137  K 4.4 6.9* 5.7* 4.4 4.7 4.9  CL 99 100 100 101  --   --   CO2 18* 15* 18* 20*  --   --   GLUCOSE 236* 365* 332* 148*  --   --   BUN 72* 87* 88* 93*  --   --   CREATININE 2.21* 2.20* 2.24* 2.14*  --   --   CALCIUM 7.5* 7.8* 7.7* 7.7*  --   --   PHOS 4.3 4.8*  --  3.9  --   --     Liver Function Tests: Recent Labs  Lab 10/10/22 0216 10/11/22 0857 10/12/22 0318 10/13/22 0129  AST 13*  --   --   --   ALT 17  --   --   --   ALKPHOS 180*  --   --   --   BILITOT 0.6  --   --   --   PROT 6.8  --   --   --   ALBUMIN 2.6* 2.6* 2.6* 2.5*    No results for input(s): "LIPASE", "AMYLASE" in the last 168 hours. CBC: Recent Labs  Lab 10/09/22 2009 10/10/22 0216 10/11/22 0857 10/12/22 0318 10/13/22 0123 10/13/22 0436 10/13/22 0641  WBC 16.9* 16.9* 26.9* 25.3* 33.0*  --   --   NEUTROABS 13.5*  --  23.9* 22.5*  --   --   --   HGB 9.9*  9.6* 10.3* 10.5* 10.1* 10.9* 10.5*  HCT 29.8* 29.3* 31.9* 32.5* 30.9* 32.0* 31.0*  MCV 77.0* 77.3* 81.4 80.8 78.2*  --   --   PLT 365 351 411* 397 550*  --   --     Blood Culture    Component Value Date/Time   SDES EXPECTORATED SPUTUM 10/10/2022 0630   SDES EXPECTORATED SPUTUM 10/10/2022 0630   SPECREQUEST Immunocompromised 10/10/2022 0630   SPECREQUEST Immunocompromised Reflexed from [id number] 10/10/2022 0630   CULT  10/10/2022 0630    CULTURE REINCUBATED FOR BETTER GROWTH Performed at [facility], [address] [place], [place] [address]    REPTSTATUS 10/10/2022 FINAL 10/10/2022 0630   REPTSTATUS PENDING 10/10/2022 0630    Cardiac Enzymes: No results for input(s): "CKTOTAL", "CKMB", "CKMBINDEX", "TROPONINI" in the last 168 hours. CBG: Recent Labs  Lab 10/12/22 2347 10/13/22 0037 10/13/22 0203 10/13/22 0324 10/13/22 0425  GLUCAP 164* 149*  144* 146* 176*    Iron Studies:  No results for input(s): "IRON", "TIBC", "TRANSFERRIN", "FERRITIN" in the last 72 hours.  @lablastinr3 @ Studies/Results: DG Abd Portable 1V  Result Date: 10/13/2022 CLINICAL DATA:  47 year old female status post orogastric tube placement. EXAM: PORTABLE ABDOMEN - 1 VIEW COMPARISON:  06/17/2015. FINDINGS: Tip of enteric tube is in the antral pre-pyloric region of the stomach. Surgical clips project over the right upper quadrant of the abdomen, likely from prior cholecystectomy. Lower pelvis is incompletely imaged. Within the visualized abdomen, there is no pathologic dilatation of small bowel or colon. No definite pneumoperitoneum noted on this single supine image. IMPRESSION: 1. Tip of enteric tube is in the antral pre-pyloric region of the stomach. 2. Nonobstructive bowel gas pattern. Electronically Signed   By: Trudie Reed M.D.   On: 10/13/2022 06:53   Portable Chest x-ray  Result Date: 10/13/2022 CLINICAL DATA:  47 year old female status post intubation. EXAM: PORTABLE CHEST 1 VIEW  COMPARISON:  Chest x-ray 10/13/2022. FINDINGS: An endotracheal tube is in place with tip 3.7 cm above the carina. Widespread but patchy and asymmetrically distributed interstitial and airspace disease noted throughout the lungs bilaterally, with overall worsening aeration, most compatible with severe multilobar bilateral pneumonia. Small right pleural effusion. No definite left pleural effusion. No pneumothorax. Pulmonary vasculature is obscured. Heart size is borderline enlarged, accentuated by patient's rotation to the right. IMPRESSION: 1. Support apparatus, as above. 2. Worsening severe multilobar bilateral pneumonia with small right pleural effusion. Electronically Signed   By: Trudie Reed M.D.   On: 10/13/2022 06:45   DG CHEST PORT 1 VIEW  Result Date: 10/13/2022 CLINICAL DATA:  200808 with hypoxia. EXAM: PORTABLE CHEST 1 VIEW COMPARISON:  Chest 10/11/2022, chest CT no contrast 10/12/2022 FINDINGS: 4:18 a.m. Multiple overlying monitor wires. Stable cardiomegaly and central vascular prominence. Extensive bilateral dense patchy airspace disease continues to be seen, with likely underlying interstitial component. Small pleural effusions are unchanged.  The mediastinum is stable. The overall aeration seems unchanged. No new or worsening findings. No acute osseous findings. IMPRESSION: No significant change from yesterday's CT. Extensive bilateral airspace disease with likely underlying interstitial component and small pleural effusions. Stable cardiomegaly and central vascular prominence. Electronically Signed   By: Almira Bar M.D.   On: 10/13/2022 06:23   ECHOCARDIOGRAM COMPLETE  Result Date: 10/12/2022    ECHOCARDIOGRAM REPORT   Patient Name:   Linda Ellison Date of Exam: 10/12/2022 Medical Rec #:  [id number]      Height:       66.5 in Accession #:    [id number]     Weight:       262.3 lb Date of Birth:  February 24, 1976      BSA:          2.257 m Patient Age:    46 years       BP:           142/81 mmHg  Patient Gender: F              HR:           96 bpm. Exam Location:  Inpatient Procedure: 2D Echo, Color Doppler and Cardiac Doppler Indications:    dyspnea  History:        Patient has prior history of Echocardiogram examinations, most                 recent 12/27/2013. CAD, end stage renal disease; Risk  Factors:Diabetes, Hypertension, Dyslipidemia and Sleep Apnea.  Sonographer:    Delcie Roch RDCS Referring Phys: [id number] DANIEL V THOMPSON  Sonographer Comments: Image acquisition challenging due to patient body habitus, Image acquisition challenging due to respiratory motion and dyspnea, high flow O2. IMPRESSIONS  1. Left ventricular ejection fraction, by estimation, is 65 to 70%. The left ventricle has hyperdynamic function. The left ventricle has no regional wall motion abnormalities. Left ventricular diastolic parameters are consistent with Grade II diastolic dysfunction (pseudonormalization). Elevated left atrial pressure.  2. Right ventricular systolic function is normal. The right ventricular size is mildly enlarged. There is moderately elevated pulmonary artery systolic pressure. The estimated right ventricular systolic pressure is 50.8 mmHg.  3. The mitral valve is normal in structure. Trivial mitral valve regurgitation.  4. Tricuspid valve regurgitation is mild to moderate.  5. The aortic valve is tricuspid. Aortic valve regurgitation is not visualized. No aortic stenosis is present.  6. The inferior vena cava is dilated in size with >50% respiratory variability, suggesting right atrial pressure of 8 mmHg. Comparison(s): Prior images unable to be directly viewed, comparison made by report only. FINDINGS  Left Ventricle: Left ventricular ejection fraction, by estimation, is 65 to 70%. The left ventricle has hyperdynamic function. The left ventricle has no regional wall motion abnormalities. The left ventricular internal cavity size was normal in size. There is borderline concentric left  ventricular hypertrophy. Left ventricular diastolic parameters are consistent with Grade II diastolic dysfunction (pseudonormalization). Elevated left atrial pressure. Right Ventricle: The right ventricular size is mildly enlarged. No increase in right ventricular wall thickness. Right ventricular systolic function is normal. There is moderately elevated pulmonary artery systolic pressure. The tricuspid regurgitant velocity is 3.27 m/s, and with an assumed right atrial pressure of 8 mmHg, the estimated right ventricular systolic pressure is 50.8 mmHg. Left Atrium: Left atrial size was normal in size. Right Atrium: Right atrial size was normal in size. Pericardium: Trivial pericardial effusion is present. Mitral Valve: The mitral valve is normal in structure. Trivial mitral valve regurgitation. Tricuspid Valve: The tricuspid valve is normal in structure. Tricuspid valve regurgitation is mild to moderate. Aortic Valve: The aortic valve is tricuspid. Aortic valve regurgitation is not visualized. No aortic stenosis is present. Pulmonic Valve: The pulmonic valve was grossly normal. Pulmonic valve regurgitation is not visualized. No evidence of pulmonic stenosis. Aorta: The aortic root and ascending aorta are structurally normal, with no evidence of dilitation. Venous: The inferior vena cava is dilated in size with greater than 50% respiratory variability, suggesting right atrial pressure of 8 mmHg. IAS/Shunts: No atrial level shunt detected by color flow Doppler.  LEFT VENTRICLE PLAX 2D LVIDd:         4.50 cm   Diastology LVIDs:         2.70 cm   LV e' medial:    7.83 cm/s LV PW:         1.14 cm   LV E/e' medial:  17.4 LV IVS:        1.10 cm   LV e' lateral:   10.40 cm/s LVOT diam:     1.90 cm   LV E/e' lateral: 13.1 LV SV:         67 LV SV Index:   30 LVOT Area:     2.84 cm  RIGHT VENTRICLE             IVC RV Basal diam:  2.90 cm     IVC diam: 2.40 cm RV S prime:  16.20 cm/s TAPSE (M-mode): 1.8 cm LEFT ATRIUM              Index        RIGHT ATRIUM           Index LA diam:        3.30 cm 1.46 cm/m   RA Area:     13.40 cm LA Vol (A2C):   53.4 ml 23.66 ml/m  RA Volume:   31.30 ml  13.87 ml/m LA Vol (A4C):   49.9 ml 22.11 ml/m LA Biplane Vol: 52.7 ml 23.35 ml/m  AORTIC VALVE LVOT Vmax:   119.00 cm/s LVOT Vmean:  83.600 cm/s LVOT VTI:    0.237 m  AORTA Ao Root diam: 2.80 cm Ao Asc diam:  3.10 cm MITRAL VALVE                TRICUSPID VALVE MV Area (PHT): 4.21 cm     TR Peak grad:   42.8 mmHg MV Decel Time: 180 msec     TR Vmax:        327.00 cm/s MV E velocity: 136.00 cm/s MV A velocity: 81.90 cm/s   SHUNTS MV E/A ratio:  1.66         Systemic VTI:  0.24 m                             Systemic Diam: 1.90 cm Rachelle Hora Croitoru MD Electronically signed by Thurmon Fair MD Signature Date/Time: 10/12/2022/3:51:33 PM    Final    CT CHEST WO CONTRAST  Result Date: 10/12/2022 CLINICAL DATA:  Respiratory illness, immunodeficiency EXAM: CT CHEST WITHOUT CONTRAST TECHNIQUE: Multidetector CT imaging of the chest was performed following the standard protocol without IV contrast. RADIATION DOSE REDUCTION: This exam was performed according to the departmental dose-optimization program which includes automated exposure control, adjustment of the mA and/or kV according to patient size and/or use of iterative reconstruction technique. COMPARISON:  Previous studies including chest radiograph done on 10/11/2022 and CT chest done on 07/12/2015 FINDINGS: Cardiovascular: Scattered coronary artery calcifications are seen. Small pericardial effusion is seen. Mediastinum/Nodes: There are enlarged lymph nodes in mediastinum largest in the subcarinal region measuring 1.5 cm in short axis. Lungs/Pleura: Extensive patchy ground-glass infiltrates are seen in both lungs. Ground-glass and alveolar infiltrates are seen in both lower lobes. Small bilateral pleural effusions are seen, more so on the left side. There is no pneumothorax. Upper Abdomen: There is  atrophy in the visualized upper pole of right kidney. Arterial calcifications are seen. Musculoskeletal: No acute findings are seen. IMPRESSION: Extensive patchy ground-glass infiltrates are noted in both lungs suggesting multifocal pneumonia, possibly atypical viral pneumonia. Small bilateral pleural effusions. Small pericardial effusion. There are scattered coronary artery calcifications suggesting coronary artery disease. Calcifications are noted in splenic artery suggesting arteriosclerosis. There is cortical thinning in the visualized upper pole of right kidney suggesting atrophy. Electronically Signed   By: Ernie Avena M.D.   On: 10/12/2022 13:41   DG CHEST PORT 1 VIEW  Result Date: 10/11/2022 CLINICAL DATA:  Shortness of breath and wheezing.  Pneumonia. EXAM: PORTABLE CHEST 1 VIEW COMPARISON:  10/09/2022 FINDINGS: Widespread bilateral patchy pulmonary infiltrates persist and are slightly radiographically worsened. No detectable effusion. No qualitatively new finding. IMPRESSION: Widespread bilateral patchy pulmonary infiltrates, slightly worsened. Electronically Signed   By: Paulina Fusi M.D.   On: 10/11/2022 12:42   Medications:  sodium chloride 10 mL/hr at 10/13/22 0700  fentaNYL infusion INTRAVENOUS 150 mcg/hr (10/13/22 0700)   insulin Stopped (10/13/22 0248)   piperacillin-tazobactam Stopped (10/13/22 0302)   propofol (DIPRIVAN) infusion 50 mcg/kg/min (10/13/22 0704)    arformoterol  15 mcg Nebulization BID   ARIPiprazole  30 mg Per Tube Daily   atorvastatin  10 mg Per Tube Daily   buPROPion  450 mg Oral Daily   cinacalcet  30 mg Oral Q breakfast   docusate  100 mg Per Tube BID   doxycycline  100 mg Oral Q12H   etomidate       fentaNYL (SUBLIMAZE) injection  100 mcg Intravenous Once   fentaNYL (SUBLIMAZE) injection  50 mcg Intravenous Once   folic acid  1 mg Oral Daily   guaiFENesin  1,200 mg Oral BID   heparin  5,000 Units Subcutaneous Q8H   hydrOXYzine  25 mg Oral QHS    insulin aspart  2-6 Units Subcutaneous Q4H   insulin detemir  18 Units Subcutaneous Daily   loratadine  10 mg Oral Daily   midazolam  2 mg Intravenous Once   montelukast  10 mg Oral QHS   multivitamins with iron  1 tablet Oral Daily   mycophenolate  360 mg Oral BID   pantoprazole  40 mg Oral Daily   polyethylene glycol  17 g Per Tube Daily   predniSONE  40 mg Oral Q breakfast   revefenacin  175 mcg Nebulization Daily   rocuronium  100 mg Intravenous Once   sodium bicarbonate  1,300 mg Oral BID   tacrolimus  4 mg Oral QHS   tacrolimus  5 mg Oral Daily    Assessment/Plan:  AKI/CKD stage IIIa following DDKT- Baseline in the 1.4-1.6 range it appears. Admission Cr 5 presumably due to ischemic ATN in setting of poor po intake and diarrhea for the past 5-6 days.  Scr has improved to low 2s.  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  Continue daily labs for now.  OK with PRN diuretics for volume - give 60 IV now so we don't get behind.   Avoid nephrotoxic medications including NSAIDs and iodinated intravenous contrast exposure unless the latter is absolutely indicated.  Preferred narcotic agents for pain control are hydromorphone, fentanyl, and methadone. Morphine should not be used. Avoid Baclofen and avoid oral sodium phosphate and magnesium citrate based laxatives / bowel preps. Continue strict Input and Output monitoring. Will monitor the patient closely with you and intervene or adjust therapy as indicated by changes in clinical status/labs  Acute hypoxic respiratory failure with acute asthma exacerbation  + pneumonia - broad spectrum antibiotics per primary svc.  On prednisone as well.   Worsening status in past 24h.  Non contrasted CT chest with extensive BL multifocal opacities. Plans for for bronchoscopy given immunocompromised status.   As above lasix 60 IV x 1 dose today.  Immunosuppressive therapy - normally on myfortic 720 mg bid, prograf 5 mg qam and 4 mg qpm, and prednisone 5 mg.   Holding myfortic in setting of severe illness.  Tac level pending 7/7 at 21:50 - appears to have been appropriately collected.  AGMA - due to #1 and diarrhea.  S/p LR and on po bicarb now - acceptable   CAD - not an acute issue. DM type 2 - per primary svc Anemia of CKD stage III - continue to follow and transfuse for Hgb <7  Will continue to follow, reach out with concerns.   Estill Bakes MD 10/13/2022, 7:32 AM  [facility] Pager: [phone]

## 2022-10-13 NOTE — Procedures (Signed)
Bronchoscopy Procedure Note  MILLER EDGINGTON  [id number]  03-28-1976  Date:10/13/22  Time:9:05 AM   Provider Performing:Dusty Wagoner   Procedure(s):  Flexible bronchoscopy with bronchial alveolar lavage (11914)  Indication(s) Acute respiratory failure  Consent Risks of the procedure as well as the alternatives and risks of each were explained to the patient and/or caregiver.  Consent for the procedure was obtained and is signed in the bedside chart  Anesthesia Etomidate   Time Out Verified patient identification, verified procedure, site/side was marked, verified correct patient position, special equipment/implants available, medications/allergies/relevant history reviewed, required imaging and test results available.   Sterile Technique Usual hand hygiene, masks, gowns, and gloves were used   Procedure Description Bronchoscope advanced through endotracheal tube and into airway.  Airways were examined down to subsegmental level with findings noted below.   Following diagnostic evaluation, BAL(s) performed in right lower lobe/left lower lobe with normal saline and return of greenish fluid  Findings: Normal pink bronchial mucosa, moderate amount of thin secretions noted in bilateral lower lobes left more than right, BAL was performed   Complications/Tolerance None; patient tolerated the procedure well. Chest X-ray is needed post procedure.   EBL Minimal   Specimen(s) BAL

## 2022-10-13 NOTE — Procedures (Signed)
Intubation Procedure Note  Linda Ellison  [id number]  1976-03-05  Date:10/13/22  Time:5:31 AM   Provider Performing:Tyrique Sporn R [name]    Procedure: Intubation (31500)  Indication(s) Respiratory Failure  Consent Risks of the procedure as well as the alternatives and risks of each were explained to the patient and/or caregiver.  Consent for the procedure was obtained and is signed in the bedside chart   Anesthesia Versed, Fentanyl, and Rocuronium   Time Out Verified patient identification, verified procedure, site/side was marked, verified correct patient position, special equipment/implants available, medications/allergies/relevant history reviewed, required imaging and test results available.   Sterile Technique Usual hand hygeine, masks, and gloves were used   Procedure Description Patient positioned in bed supine.  Sedation given as noted above.  Patient was intubated with endotracheal tube using Glidescope.  View was Grade 2 only posterior commissure .  Number of attempts was 1.  Colorimetric CO2 detector was consistent with tracheal placement.   Complications/Tolerance None; patient tolerated the procedure well. Chest X-ray is ordered to verify placement.   EBL none   Specimen(s) None

## 2022-10-13 NOTE — Progress Notes (Signed)
NAME:  Linda Ellison, MRN:  [id number], DOB:  23-Jan-1976, LOS: 3 ADMISSION DATE:  10/09/2022, CONSULTATION DATE: 10/12/2022 REFERRING MD: Triad, CHIEF COMPLAINT: Respiratory distress  History of Present Illness:  47 year old female with extensive past medical history is well-documented below who was admitted 2 days prior to this consult with multifocal pneumonia.  She has had increasing work of breathing with a respiratory rate of 46.  She had a CT scan that demonstrates bilateral multifocal pneumonia, elevated BNP, and increased work of breathing that will require her being transferred to the intensive care unit.  She has noted a nonproductive cough prior to admission.  Increasing shortness of breath prior to admission.  And increasing lower extremity edema.  She is status post cadaveric renal transplant and she is followed by nephrology.  Currently she is hypokalemic with creatinine in the 2.2 range.  Due to her increasing work of breathing impending respiratory failure she will be transferred to intensive care unit for further evaluation and treatment.  She has already been started on aggressive diuresis and steroids and empirical antimicrobial therapy.  Pertinent  Medical History   Past Medical History:  Diagnosis Date   Allergy    Anemia associated with chronic renal failure    Anxiety    Asthma    Atypical chest pain    long-standing -- normal cardio cath 06-27-2012 and normal nuclear stress test 06-25-2016   AV (arteriovenous fistula) (HCC)    for dialysis-- currently located left radiocephalic   CAD (coronary artery disease) cardiologist-- dr Sharol Roussel [facility] cardiology in [place])   a. False positive stress echo 06/2012 at [facility] - cath with no obstructive CAD at [facility] (mild luminal irregularities in LAD, moderate diffuse disease in distal RCA).   Depression    Elevated lipids    ESRD on hemodialysis [facility]) NEPHROLOGIST-  DR MATTINGLY   started dialysis 01/29/11:  Foye Spurling [facility] on MWF   Gastroparesis    GERD (gastroesophageal reflux disease)    History of pneumonia    HCAP 04/ 2017   Hyperlipidemia    Hyperthyroidism    Menorrhagia    Other secondary hypertension    associated to diabetes -- followed by cardiologist ( dr Sharol Roussel)     Peripheral neuropathy    PONV (postoperative nausea and vomiting)    Pre-transplant evaluation for kidney transplant    [facility]   Renal failure syndrome 03/05/2022   Secondary hyperparathyroidism of renal origin [facility])    Sleep apnea    Type 2 diabetes mellitus, with long-term current use of insulin (HCC)    dx 1985     Significant Hospital Events: Including procedures, antibiotic start and stop dates in addition to other pertinent events   07/09 Transferred to ICU  07/10: Intubated   Interim History / Subjective:  Overnight patient was intubated.   Patient evaluated bedside this morning.  Patient intubated and sedated.  Not arousable.  Objective   Blood pressure (!) 161/70, pulse 91, temperature 98.3 F (36.8 C), temperature source Axillary, resp. rate (!) 28, height 5\' 6"  (1.676 m), weight 118.7 kg, SpO2 100 %.    Vent Mode: PRVC FiO2 (%):  [75 %-100 %] 100 % Set Rate:  [0 bmp-30 bmp] 30 bmp Vt Set:  [470 mL] 470 mL PEEP:  [8 cmH20-12 cmH20] 12 cmH20 Pressure Support:  [6 cmH20] 6 cmH20 Plateau Pressure:  [29 cmH20] 29 cmH20   Intake/Output Summary (Last 24 hours) at 10/13/2022 1115  Last data filed at 10/13/2022 0700 Gross per 24 hour  Intake 1120.18 ml  Output 200 ml  Net 920.18 ml   Filed Weights   10/09/22 1844 10/10/22 0606 10/12/22 1630  Weight: 114.3 kg 119 kg 118.7 kg    Examination: General: Intubated and sedated, critically ill HENT: Normocephalic, atraumatic Lungs: Bilateral coarse ventilated breath sounds.  Decreased breath sounds throughout. Cardiovascular: Regular rate and rhythm, no murmurs, rubs, gallops Abdomen:  Soft,  nondistended Extremities: 2+ pitting edema bilaterally Neuro: Intubated and sedated GU: 200 mL out  Labs: Glucose: 144-176 RFP sodium 135, potassium 4.4, bicarb 20, creatinine 2.14, Pro-Cal 0.51 CBC: White count 33 (25), hemoglobin 10, platelet 550 Micro: Gram-positive rods, Gram positive cocci, gram-negative rods   Urine output Resolved Hospital Problem list     Assessment & Plan:   #Acute respiratory distress syndrome secondary to multifocal pneumonia #Immunocompromised on tacrolimus #Concern for PJP Overnight, patient became more tachypneic, requiring intubation.  Patient has extensive multifocal pneumonia.  Had bedside bronc today with BAL.  Patient was on broad-spectrum antibiotics, but transition to Bactrim and Zosyn.  Will follow cultures.  Will continue with serial ABGs. -Follow cultures -Continue Bactrim day 1 -Continue Zosyn day 3 -Continue prednisone 40 mg twice daily -Continue bronchodilators -Can wean off sedation starting tomorrow -VAP bundle  #History of renal transplant secondary to acute renal failure #Electrolyte derangements Nephrology is following.  Creatinine up to 2.14.  Patient baseline around 1.6-1.8.  Nephrology following. -Replete electrolytes as needed -Nephrology following, appreciate recommendations  #Type 1 diabetes Patient does have history of type 1 diabetes.  Patient on insulin pump.  Patient was having elevated blood sugars.  Transition to Endo tool, but now trends transition to basal bolus. -Continue to monitor closely -Glargine 30 units daily -Insulin sliding scale -Monitor CBGs every 4 hours  #Anemia of chronic disease Hemoglobin stable at this time. -No acute concern for bleeding -Monitor CBC  Best Practice (right click and "Reselect all SmartList Selections" daily)   Diet/type: Regular consistency (see orders) DVT prophylaxis: prophylactic heparin  GI prophylaxis: PPI Lines: N/A Foley:  N/A Code Status:  full  code Last date of multidisciplinary goals of care discussion [tbd]  Labs   CBC: Recent Labs  Lab 10/09/22 2009 10/10/22 0216 10/11/22 0857 10/12/22 0318 10/13/22 0123 10/13/22 0436 10/13/22 0641  WBC 16.9* 16.9* 26.9* 25.3* 33.0*  --   --   NEUTROABS 13.5*  --  23.9* 22.5*  --   --   --   HGB 9.9* 9.6* 10.3* 10.5* 10.1* 10.9* 10.5*  HCT 29.8* 29.3* 31.9* 32.5* 30.9* 32.0* 31.0*  MCV 77.0* 77.3* 81.4 80.8 78.2*  --   --   PLT 365 351 411* 397 550*  --   --     Basic Metabolic Panel: Recent Labs  Lab 10/09/22 2009 10/10/22 0216 10/10/22 0636 10/11/22 0857 10/12/22 0318 10/12/22 0714 10/13/22 0129 10/13/22 0436 10/13/22 0641  NA 131*   < > 134* 133* 133* 134* 135 137 137  K 2.5*   < > 3.2* 4.4 6.9* 5.7* 4.4 4.7 4.9  CL 94*   < > 99 99 100 100 101  --   --   CO2 20*   < > 21* 18* 15* 18* 20*  --   --   GLUCOSE 138*   < > 96 236* 365* 332* 148*  --   --   BUN 71*   < > 67* 72* 87* 88* 93*  --   --  CREATININE 5.05*   < > 4.06* 2.21* 2.20* 2.24* 2.14*  --   --   CALCIUM 6.8*   < > 7.0* 7.5* 7.8* 7.7* 7.7*  --   --   MG 1.5*  --  1.4* 1.8  --   --   --   --   --   PHOS  --   --   --  4.3 4.8*  --  3.9  --   --    < > = values in this interval not displayed.   GFR: Estimated Creatinine Clearance: 43.1 mL/min (A) (by C-G formula based on SCr of 2.14 mg/dL (H)). Recent Labs  Lab 10/10/22 0216 10/11/22 0857 10/12/22 0318 10/13/22 0123  PROCALCITON  --   --   --  0.51  WBC 16.9* 26.9* 25.3* 33.0*    Liver Function Tests: Recent Labs  Lab 10/10/22 0216 10/11/22 0857 10/12/22 0318 10/13/22 0129  AST 13*  --   --   --   ALT 17  --   --   --   ALKPHOS 180*  --   --   --   BILITOT 0.6  --   --   --   PROT 6.8  --   --   --   ALBUMIN 2.6* 2.6* 2.6* 2.5*   No results for input(s): "LIPASE", "AMYLASE" in the last 168 hours. No results for input(s): "AMMONIA" in the last 168 hours.  ABG    Component Value Date/Time   PHART 7.285 (L) 10/13/2022 0641   PCO2ART  45.0 10/13/2022 0641   PO2ART 55 (L) 10/13/2022 0641   HCO3 21.4 10/13/2022 0641   TCO2 23 10/13/2022 0641   ACIDBASEDEF 5.0 (H) 10/13/2022 0641   O2SAT 85 10/13/2022 0641     Coagulation Profile: No results for input(s): "INR", "PROTIME" in the last 168 hours.  Cardiac Enzymes: No results for input(s): "CKTOTAL", "CKMB", "CKMBINDEX", "TROPONINI" in the last 168 hours.  HbA1C: Hgb A1c MFr Bld  Date/Time Value Ref Range Status  08/27/2022 03:43 AM 6.7 (H) 4.8 - 5.6 % Final    Comment:    (NOTE)         Prediabetes: 5.7 - 6.4         Diabetes: >6.4         Glycemic control for adults with diabetes: <7.0   11/21/2017 06:49 PM 9.9 (H) 4.8 - 5.6 % Final    Comment:    (NOTE) Pre diabetes:          5.7%-6.4% Diabetes:              >6.4% Glycemic control for   <7.0% adults with diabetes     CBG: Recent Labs  Lab 10/13/22 0037 10/13/22 0203 10/13/22 0324 10/13/22 0425 10/13/22 0819  GLUCAP 149* 144* 146* 176* 212*    Review of Systems:   10 point review of system taken, please see HPI for positives and negatives.   Past Medical History:  She,  has a past medical history of Allergy, Anemia associated with chronic renal failure, Anxiety, Asthma, Atypical chest pain, AV (arteriovenous fistula) (HCC), CAD (coronary artery disease) (cardiologist-- dr Sharol Roussel [facility] cardiology in [place])), Depression, Elevated lipids, ESRD on hemodialysis (HCC) (NEPHROLOGIST-  DR MATTINGLY), Gastroparesis, GERD (gastroesophageal reflux disease), History of pneumonia, Hyperlipidemia, Hyperthyroidism, Menorrhagia, Other secondary hypertension, Peripheral neuropathy, PONV (postoperative nausea and vomiting), Pre-transplant evaluation for kidney transplant, Renal failure syndrome (03/05/2022), Secondary hyperparathyroidism of renal origin [facility]), Sleep apnea, and Type  2 diabetes mellitus, with long-term current use of insulin (HCC).   Surgical History:   Past Surgical History:   Procedure Laterality Date   AV FISTULA PLACEMENT  08/2010   Left radiocephalic AVF   AV FISTULA PLACEMENT Right 05/07/2014   Procedure: ARTERIOVENOUS (AV) FISTULA CREATION;  Surgeon: Sherren Kerns, MD;  Location: [facility] OR;  Service: Vascular;  Laterality: Right;   AV FISTULA PLACEMENT Right 07/02/2014   Procedure: INSERTION OF ARTERIOVENOUS (AV) GORE-TEX GRAFT ARM;  Surgeon: Sherren Kerns, MD;  Location: [facility] OR;  Service: Vascular;  Laterality: Right;   CARDIOVASCULAR STRESS TEST  06/25/2016   [facility]   normal nuclear perfusion study w/ no ischemia/  normal LV function and wall motion , ef 51%   CESAREAN SECTION  03/22/2001   w/ Bilateral Tubal Ligation   COLONOSCOPY     COLONOSCOPY WITH ESOPHAGOGASTRODUODENOSCOPY (EGD)  10/19/2011   DILATION AND CURETTAGE OF UTERUS  05/12/2000   w/ suction for missed ab   DOBUTAMINE STRESS ECHO  06/04/2016    [facility]   normal stress echo w/ no chest pain or ischemia/  normal LV function and wall motion , stress ef 60-65%   ESOPHAGOGASTRODUODENOSCOPY  10/19/2011   Dr. Stan Head   FEMUR IM NAIL Left child   removed   FOOT SURGERY Left 2014 approx.   HYSTEROSCOPY WITH NOVASURE N/A 08/19/2016   Procedure: HYSTEROSCOPY WITH NOVASURE;  Surgeon: Huel Cote, MD;  Location: [facility];  Service: Gynecology;  Laterality: N/A;   KIDNEY TRANSPLANT  09/18/2016   LAPAROSCOPIC CHOLECYSTECTOMY  1994   LEFT HEART CATHETERIZATION WITH CORONARY ANGIOGRAM N/A 06/27/2012   Procedure: LEFT HEART CATHETERIZATION WITH CORONARY ANGIOGRAM;  Surgeon: Laurey Morale, MD;  Location: [facility] CATH LAB;  Service: Cardiovascular;  Laterality: N/A; No ostructive CAD, normal LVF, ef 55-60% (mild LAD luminal irregularities, moderate dRCA diffuse disease)   LIGATION GORETEX FISTULA  01/04/11   Left AVF   LIGATION OF ARTERIOVENOUS  FISTULA Left 08/21/2014   Procedure: LIGATION OF ARTERIOVENOUS  FISTULA;  Surgeon: Annice Needy, MD;  Location: [facility] ORS;  Service: Vascular;   Laterality: Left;   LIGATION OF ARTERIOVENOUS  FISTULA Left 06/29/2017   Procedure: LIGATION OF ARTERIOVENOUS  FISTULA ( RADIOCEPHALIC );  Surgeon: Annice Needy, MD;  Location: [facility] ORS;  Service: Vascular;  Laterality: Left;   NEPHRECTOMY TRANSPLANTED ORGAN     PERIPHERAL VASCULAR CATHETERIZATION Left 08/08/2014   Procedure: Upper Extremity Angiography;  Surgeon: Annice Needy, MD;  Location: [facility] INVASIVE CV LAB;  Service: Cardiovascular;  Laterality: Left;   PERIPHERAL VASCULAR CATHETERIZATION Left 08/08/2014   Procedure: Upper Extremity Intervention;  Surgeon: Annice Needy, MD;  Location: [facility] INVASIVE CV LAB;  Service: Cardiovascular;  Laterality: Left;   PERIPHERAL VASCULAR CATHETERIZATION Left 03/08/2016   Procedure: A/V Fistulagram;  Surgeon: Annice Needy, MD;  Location: [facility] INVASIVE CV LAB;  Service: Cardiovascular;  Laterality: Left;   POLYPECTOMY     THROMBECTOMY AND REVISION OF ARTERIOVENTOUS (AV) GORETEX  GRAFT Right 07/16/2014   Procedure: THROMBECTOMY  Right  arm  ARTERIOVENOUS  GORETEX  GRAFT;  Surgeon: Sherren Kerns, MD;  Location: [facility] OR;  Service: Vascular;  Laterality: Right;   TRANSTHORACIC ECHOCARDIOGRAM  06/04/2016   mild concentric LVH, ef 55-60%,  mild TR,  borderline LAE,  trivial MR and PR     Social History:   reports that she has never smoked. She has never used smokeless tobacco. She reports that she does not  drink alcohol and does not use drugs.   Family History:  Her family history includes Arthritis in her brother; Diabetes in her father, maternal aunt, and paternal aunt; Heart disease in her brother, mother, and paternal aunt; Hypertension in her father, maternal aunt, and paternal aunt; Kidney disease in her maternal grandmother and mother; Kidney failure in her paternal aunt and paternal uncle. There is no history of Colon cancer, Colon polyps, Crohn's disease, Esophageal cancer, Rectal cancer, Stomach cancer, or Ulcerative colitis.   Allergies Allergies  Allergen  Reactions   Ciprofloxacin Hives, Itching and Nausea And Vomiting   Fluocinolone Itching, Nausea And Vomiting and Other (See Comments)   Hydromorphone Hives   Strawberry Extract Itching and Other (See Comments)    Pt states that this medication makes her tongue raw.   No reaction   Hydromorphone Hcl Hives   Phenytoin Sodium Extended Itching   Adhesive [Tape] Itching and Rash   Chlorhexidine Gluconate Itching   Clindamycin Diarrhea, Nausea And Vomiting and Rash   Shrimp [Shellfish Allergy] Rash     Home Medications  Prior to Admission medications   Medication Sig Start Date End Date Taking? Authorizing Provider  albuterol (PROVENTIL HFA;VENTOLIN HFA) 108 (90 Base) MCG/ACT inhaler Inhale 2 puffs into the lungs every 6 (six) hours as needed for wheezing or shortness of breath. 11/25/17  Yes Nwoko, Agnes I, NP  amLODipine (NORVASC) 10 MG tablet Take 1 tablet by mouth daily. 02/11/22  Yes [provider]  ARIPiprazole (ABILIFY) 30 MG tablet Take 30 mg by mouth daily. 01/26/22  Yes [provider]  atorvastatin (LIPITOR) 10 MG tablet Take 10 mg by mouth daily. 02/08/22  Yes [provider]  buPROPion (WELLBUTRIN XL) 150 MG 24 hr tablet Take 1 tablet (150 mg total) by mouth daily after breakfast. For depression Patient taking differently: Take 150 mg by mouth daily after breakfast. Take with 300mg  for a total daily dose of 450mg  for depression 11/26/17  Yes Nwoko, Agnes I, NP  buPROPion (WELLBUTRIN XL) 300 MG 24 hr tablet Take 300 mg by mouth daily.   Yes [provider]  cetirizine (ZYRTEC) 10 MG tablet Take 10 mg by mouth 2 (two) times daily.   Yes [provider]  cinacalcet (SENSIPAR) 30 MG tablet Take 30 mg by mouth daily. 03/10/22  Yes [provider]  citalopram (CELEXA) 40 MG tablet Take 40 mg by mouth daily.   Yes [provider]  cloNIDine (CATAPRES) 0.3 MG tablet Take 0.3 mg by mouth 3 (three) times daily.    Yes [provider]  Dulaglutide 4.5 MG/0.5ML SOPN Inject 4.5 mg into the skin once a week. Every Tuesday 12/09/21  Yes [provider]  gabapentin (NEURONTIN) 300 MG capsule Take 300 mg by mouth at bedtime as needed. pain   Yes [provider]  hydrOXYzine (ATARAX) 25 MG tablet Take 25 mg by mouth at bedtime.   Yes [provider]  insulin aspart (NOVOLOG) 100 UNIT/ML injection USE UP TO 100 UNITS ONCE DAILY IN INSULIN PUMP. DX CODE E11.42 03/10/22  Yes [provider]  Insulin Human (INSULIN PUMP) SOLN Inject 1 each into the skin See admin instructions.   Yes [provider]  labetalol (NORMODYNE) 200 MG tablet Take 2 tablets (400 mg total) by mouth 3 (three) times daily. For high blood pressure 11/25/17  Yes Nwoko, Agnes I, NP  montelukast (SINGULAIR) 10 MG tablet Take 10 mg by mouth at bedtime.   Yes [provider]  mycophenolate (MYFORTIC) 180 MG EC tablet Take 720 mg by mouth 2 (two) times daily.   Yes [provider]  Oxycodone HCl 10 MG TABS Take 10 mg by mouth See admin instructions. Take 1 tablet by mouth by mouth every 4 to 6 hours as needed for sciatic nerve and back pain   Yes [provider]  pantoprazole (PROTONIX) 40 MG tablet Take 1 tablet (40 mg total) by mouth daily. For acid reflux 11/26/17  Yes Nwoko, Nicole Kindred I, NP  prazosin (MINIPRESS) 1 MG capsule Take 1 mg by mouth at bedtime.   Yes [provider]  predniSONE (DELTASONE) 5 MG tablet Take 5 mg by mouth daily with breakfast.   Yes [provider]  simvastatin (ZOCOR) 10 MG tablet Take 1 tablet (10 mg total) by mouth at bedtime. For high cholesterol Patient taking differently: Take 10 mg by mouth daily. For high cholesterol 11/25/17  Yes Nwoko, Nicole Kindred I, NP  sodium bicarbonate 650 MG tablet Take 1,300 mg by mouth 2 (two) times daily.   Yes [provider]  sulfamethoxazole-trimethoprim (BACTRIM,SEPTRA) 400-80 MG tablet Take 1 tablet by mouth 3  (three) times a week. For infection Patient taking differently: Take 1 tablet by mouth every Monday, Wednesday, and Friday. For infection 11/28/17  Yes Nwoko, Nicole Kindred I, NP  tacrolimus (PROGRAF) 1 MG capsule Take 4-5 mg by mouth See admin instructions. Take 5 capsules by mouth in the morning and 4 capsules at night   Yes [provider]  zolpidem (AMBIEN) 5 MG tablet Take 1 tablet (5 mg total) by mouth at bedtime as needed for sleep. Patient taking differently: Take 5 mg by mouth at bedtime. 11/25/17  Yes Armandina Stammer I, NP  naloxone [facility]) nasal spray 4 mg/0.1 mL  10/28/21   [provider]     Critical care time: 34 min    Modena Slater DO Internal Medicine Resident PGY-2 [phone] 10/13/2022, 11:15 AM

## 2022-10-13 NOTE — Progress Notes (Addendum)
eLink Physician-Brief Progress Note Patient Name: Linda Ellison DOB: 30-Jun-1975 MRN: [id number]   Date of Service  10/13/2022  HPI/Events of Note  47 year old with end-stage renal disease status post renal transplant presented with diffuse multifocal airspace disease and respiratory failure.  She is on maximum fentanyl (200 mcg) and 50 mg with propofol with ongoing dyssynchrony with the ventilator.    eICU Interventions  Obtain ABG  Increase ceiling of fentanyl to 400 mcg   0025 -notified by respiratory team that the patient had ABG with the following results: pH: 7.34/pCO2 41/pO2 38/Bicarb 22/O2 sat 73 on the following settings: 80%/ 10 PEEP/ 30 RR/ 470.  PEEP was increased.  PF ratio <150.  Given her refractory hypoxemia, the patient should undergo prone ventilation.  There has been a small air leak present, 30-40 cc, since morning shift, but with increased pressure ventilation and prone ventilation, will have ground team assess whether the tube needs to be exchanged prior to prone ventilation. Enteric tube terminates in the stomach.  Adequate IV access.  Arterial line in place.  No contraindications to prone ventilation.  RASS -4.  Currently synchronous with the ventilator.  No indication for paralytics. 0230 -ground team evaluated the patient at bedside, no plans to change ET tube at this time.  Switch from propofol to Versed for deep sedation.  Add scheduled Lasix x3 doses.  Plan for prone ventilation after repeat ABG at higher PEEP. 0353 - 60%/ 14 PEEP/ 30 RR/ 470 -> . P:F ratio is still < 150, proceed with prone ventilation.  Intervention Category Intermediate Interventions: Respiratory distress - evaluation and management  Travis Mastel 10/13/2022, 10:50 PM

## 2022-10-13 NOTE — Procedures (Signed)
Arterial Catheter Insertion Procedure Note  OLETA GUNNOE  [id number]  04/07/1975  Date:10/13/22  Time:5:30 AM    Provider Performing: Darcella Gasman Margery Szostak    Procedure: Insertion of Arterial Line (21308) with US guidance (65784)   Indication(s) Blood pressure monitoring and/or need for frequent ABGs  Consent Unable to obtain consent due to emergent nature of procedure.  Anesthesia None   Time Out Verified patient identification, verified procedure, site/side was marked, verified correct patient position, special equipment/implants available, medications/allergies/relevant history reviewed, required imaging and test results available.   Sterile Technique Maximal sterile technique including full sterile barrier drape, hand hygiene, sterile gown, sterile gloves, mask, hair covering, sterile ultrasound probe cover (if used).   Procedure Description Area of catheter insertion was cleaned with chlorhexidine and draped in sterile fashion. With real-time ultrasound guidance an arterial catheter was placed into the right radial artery.  Appropriate arterial tracings confirmed on monitor.     Complications/Tolerance None; patient tolerated the procedure well.   EBL Minimal   Specimen(s) None  Darcella Gasman Kiela Shisler, PA-C

## 2022-10-13 NOTE — Progress Notes (Signed)
Retracted ET tube to 24cm per chest xray and MD Hunsucker. Radiology reshot xray and tube is in better position now.

## 2022-10-13 NOTE — Progress Notes (Signed)
Dr. Judeth Horn at bedside to assess.

## 2022-10-13 NOTE — Progress Notes (Signed)
PT Cancellation Note  Patient Details Name: Linda Ellison MRN: [id number] DOB: [birth year]   Cancelled Treatment:    Reason Eval/Treat Not Completed: Patient not medically ready. Pt transferred to ICU and now intubated since PT order written. Will monitor for readiness.    Angelina Ok [facility] 10/13/2022, 8:39 AM Skip Mayer PT Acute Rehabilitation Services Office [phone]

## 2022-10-13 NOTE — Inpatient Diabetes Management (Signed)
Inpatient Diabetes Program Recommendations  AACE/ADA: New Consensus Statement on Inpatient Glycemic Control (2015)  Target Ranges:  Prepandial:   less than 140 mg/dL      Peak postprandial:   less than 180 mg/dL (1-2 hours)      Critically ill patients:  140 - 180 mg/dL   Lab Results  Component Value Date   GLUCAP 295 (H) 10/13/2022   HGBA1C 6.7 (H) 08/27/2022    Review of Glycemic Control  Diabetes history: DM1 Outpatient Diabetes medications: Insulin Pump Pump setting per Endo:   Basal: MN-1800 - 1.5               1800 - 1.6 - Total 36.9 units/24H CF - 20 with Goal of 110 ICR: 1:4 Current orders for Inpatient glycemic control: IV insulin  Inpatient Diabetes Program Recommendations:   Noted CBGs increased and pharmacist plan of IV insulin restarting over night to accommodate starting tube feed. Please consider when transition: -Increase Semglee to 40 units every day -Add Novolog 3-4 units tube feed q 4 hrs. -Change Novolog correction to q 4 hrs.  Thank you, Linda Ellison. Allani Reber, RN, MSN, CDE  Diabetes Coordinator Inpatient Glycemic Control Team Team Pager [phone] (8am-5pm) 10/13/2022 2:10 PM

## 2022-10-14 ENCOUNTER — Inpatient Hospital Stay (HOSPITAL_COMMUNITY): Payer: Medicare (Managed Care)

## 2022-10-14 DIAGNOSIS — J9601 Acute respiratory failure with hypoxia: Secondary | ICD-10-CM | POA: Diagnosis not present

## 2022-10-14 DIAGNOSIS — A419 Sepsis, unspecified organism: Secondary | ICD-10-CM | POA: Diagnosis not present

## 2022-10-14 DIAGNOSIS — J189 Pneumonia, unspecified organism: Secondary | ICD-10-CM | POA: Diagnosis not present

## 2022-10-14 DIAGNOSIS — D849 Immunodeficiency, unspecified: Secondary | ICD-10-CM | POA: Diagnosis not present

## 2022-10-14 DIAGNOSIS — N179 Acute kidney failure, unspecified: Secondary | ICD-10-CM | POA: Diagnosis not present

## 2022-10-14 DIAGNOSIS — R6521 Severe sepsis with septic shock: Secondary | ICD-10-CM

## 2022-10-14 LAB — POCT I-STAT 7, (LYTES, BLD GAS, ICA,H+H)
Acid-base deficit: 3 mmol/L — ABNORMAL HIGH (ref 0.0–2.0)
Acid-base deficit: 3 mmol/L — ABNORMAL HIGH (ref 0.0–2.0)
Acid-base deficit: 4 mmol/L — ABNORMAL HIGH (ref 0.0–2.0)
Acid-base deficit: 5 mmol/L — ABNORMAL HIGH (ref 0.0–2.0)
Acid-base deficit: 4 mmol/L — ABNORMAL HIGH (ref 0.0–2.0)
Acid-base deficit: 4 mmol/L — ABNORMAL HIGH (ref 0.0–2.0)
Acid-base deficit: 2 mmol/L (ref 0.0–2.0)
Acid-base deficit: 3 mmol/L — ABNORMAL HIGH (ref 0.0–2.0)
Bicarbonate: 21.9 mmol/L (ref 20.0–28.0)
Bicarbonate: 22.5 mmol/L (ref 20.0–28.0)
Bicarbonate: 23.4 mmol/L (ref 20.0–28.0)
Bicarbonate: 21.4 mmol/L (ref 20.0–28.0)
Bicarbonate: 21.6 mmol/L (ref 20.0–28.0)
Bicarbonate: 22.1 mmol/L (ref 20.0–28.0)
Bicarbonate: 22.6 mmol/L (ref 20.0–28.0)
Bicarbonate: 22.6 mmol/L (ref 20.0–28.0)
Calcium, Ion: 1.01 mmol/L — ABNORMAL LOW (ref 1.15–1.40)
Calcium, Ion: 1.02 mmol/L — ABNORMAL LOW (ref 1.15–1.40)
Calcium, Ion: 1.08 mmol/L — ABNORMAL LOW (ref 1.15–1.40)
Calcium, Ion: 1 mmol/L — ABNORMAL LOW (ref 1.15–1.40)
Calcium, Ion: 0.99 mmol/L — ABNORMAL LOW (ref 1.15–1.40)
Calcium, Ion: 1.05 mmol/L — ABNORMAL LOW (ref 1.15–1.40)
Calcium, Ion: 1.09 mmol/L — ABNORMAL LOW (ref 1.15–1.40)
Calcium, Ion: 1.05 mmol/L — ABNORMAL LOW (ref 1.15–1.40)
HCT: 29 % — ABNORMAL LOW (ref 36.0–46.0)
HCT: 30 % — ABNORMAL LOW (ref 36.0–46.0)
HCT: 31 % — ABNORMAL LOW (ref 36.0–46.0)
HCT: 33 % — ABNORMAL LOW (ref 36.0–46.0)
HCT: 29 % — ABNORMAL LOW (ref 36.0–46.0)
HCT: 30 % — ABNORMAL LOW (ref 36.0–46.0)
HCT: 30 % — ABNORMAL LOW (ref 36.0–46.0)
HCT: 30 % — ABNORMAL LOW (ref 36.0–46.0)
Hemoglobin: 10.2 g/dL — ABNORMAL LOW (ref 12.0–15.0)
Hemoglobin: 10.5 g/dL — ABNORMAL LOW (ref 12.0–15.0)
Hemoglobin: 9.9 g/dL — ABNORMAL LOW (ref 12.0–15.0)
Hemoglobin: 11.2 g/dL — ABNORMAL LOW (ref 12.0–15.0)
Hemoglobin: 9.9 g/dL — ABNORMAL LOW (ref 12.0–15.0)
Hemoglobin: 10.2 g/dL — ABNORMAL LOW (ref 12.0–15.0)
Hemoglobin: 10.2 g/dL — ABNORMAL LOW (ref 12.0–15.0)
Hemoglobin: 10.2 g/dL — ABNORMAL LOW (ref 12.0–15.0)
O2 Saturation: 100 %
O2 Saturation: 73 %
O2 Saturation: 94 %
O2 Saturation: 90 %
O2 Saturation: 93 %
O2 Saturation: 97 %
O2 Saturation: 98 %
O2 Saturation: 99 %
Patient temperature: 96.3
Patient temperature: 96.3
Patient temperature: 98.3
Patient temperature: 97.7
Patient temperature: 97.7
Patient temperature: 97.1
Patient temperature: 35.2
Patient temperature: 34.6
Potassium: 4.5 mmol/L (ref 3.5–5.1)
Potassium: 4.5 mmol/L (ref 3.5–5.1)
Potassium: 4.8 mmol/L (ref 3.5–5.1)
Potassium: 4.6 mmol/L (ref 3.5–5.1)
Potassium: 4.4 mmol/L (ref 3.5–5.1)
Potassium: 4.4 mmol/L (ref 3.5–5.1)
Potassium: 4.6 mmol/L (ref 3.5–5.1)
Potassium: 4.3 mmol/L (ref 3.5–5.1)
Sodium: 132 mmol/L — ABNORMAL LOW (ref 135–145)
Sodium: 133 mmol/L — ABNORMAL LOW (ref 135–145)
Sodium: 136 mmol/L (ref 135–145)
Sodium: 132 mmol/L — ABNORMAL LOW (ref 135–145)
Sodium: 131 mmol/L — ABNORMAL LOW (ref 135–145)
Sodium: 132 mmol/L — ABNORMAL LOW (ref 135–145)
Sodium: 133 mmol/L — ABNORMAL LOW (ref 135–145)
Sodium: 131 mmol/L — ABNORMAL LOW (ref 135–145)
TCO2: 23 mmol/L (ref 22–32)
TCO2: 24 mmol/L (ref 22–32)
TCO2: 25 mmol/L (ref 22–32)
TCO2: 23 mmol/L (ref 22–32)
TCO2: 23 mmol/L (ref 22–32)
TCO2: 23 mmol/L (ref 22–32)
TCO2: 24 mmol/L (ref 22–32)
TCO2: 24 mmol/L (ref 22–32)
pCO2 arterial: 38.2 mmHg (ref 32–48)
pCO2 arterial: 41 mmHg (ref 32–48)
pCO2 arterial: 46.3 mmHg (ref 32–48)
pCO2 arterial: 42.7 mmHg (ref 32–48)
pCO2 arterial: 41.7 mmHg (ref 32–48)
pCO2 arterial: 39.9 mmHg (ref 32–48)
pCO2 arterial: 35.6 mmHg (ref 32–48)
pCO2 arterial: 39.1 mmHg (ref 32–48)
pH, Arterial: 7.31 — ABNORMAL LOW (ref 7.35–7.45)
pH, Arterial: 7.341 — ABNORMAL LOW (ref 7.35–7.45)
pH, Arterial: 7.359 (ref 7.35–7.45)
pH, Arterial: 7.306 — ABNORMAL LOW (ref 7.35–7.45)
pH, Arterial: 7.319 — ABNORMAL LOW (ref 7.35–7.45)
pH, Arterial: 7.346 — ABNORMAL LOW (ref 7.35–7.45)
pH, Arterial: 7.402 (ref 7.35–7.45)
pH, Arterial: 7.359 (ref 7.35–7.45)
pO2, Arterial: 236 mmHg — ABNORMAL HIGH (ref 83–108)
pO2, Arterial: 38 mmHg — CL (ref 83–108)
pO2, Arterial: 70 mmHg — ABNORMAL LOW (ref 83–108)
pO2, Arterial: 62 mmHg — ABNORMAL LOW (ref 83–108)
pO2, Arterial: 70 mmHg — ABNORMAL LOW (ref 83–108)
pO2, Arterial: 88 mmHg (ref 83–108)
pO2, Arterial: 105 mmHg (ref 83–108)
pO2, Arterial: 120 mmHg — ABNORMAL HIGH (ref 83–108)

## 2022-10-14 LAB — GLUCOSE, CAPILLARY
Glucose-Capillary: 109 mg/dL — ABNORMAL HIGH (ref 70–99)
Glucose-Capillary: 120 mg/dL — ABNORMAL HIGH (ref 70–99)
Glucose-Capillary: 135 mg/dL — ABNORMAL HIGH (ref 70–99)
Glucose-Capillary: 160 mg/dL — ABNORMAL HIGH (ref 70–99)
Glucose-Capillary: 160 mg/dL — ABNORMAL HIGH (ref 70–99)
Glucose-Capillary: 162 mg/dL — ABNORMAL HIGH (ref 70–99)
Glucose-Capillary: 163 mg/dL — ABNORMAL HIGH (ref 70–99)
Glucose-Capillary: 165 mg/dL — ABNORMAL HIGH (ref 70–99)
Glucose-Capillary: 169 mg/dL — ABNORMAL HIGH (ref 70–99)
Glucose-Capillary: 179 mg/dL — ABNORMAL HIGH (ref 70–99)
Glucose-Capillary: 182 mg/dL — ABNORMAL HIGH (ref 70–99)
Glucose-Capillary: 184 mg/dL — ABNORMAL HIGH (ref 70–99)
Glucose-Capillary: 194 mg/dL — ABNORMAL HIGH (ref 70–99)
Glucose-Capillary: 194 mg/dL — ABNORMAL HIGH (ref 70–99)
Glucose-Capillary: 210 mg/dL — ABNORMAL HIGH (ref 70–99)
Glucose-Capillary: 212 mg/dL — ABNORMAL HIGH (ref 70–99)
Glucose-Capillary: 217 mg/dL — ABNORMAL HIGH (ref 70–99)
Glucose-Capillary: 223 mg/dL — ABNORMAL HIGH (ref 70–99)
Glucose-Capillary: 231 mg/dL — ABNORMAL HIGH (ref 70–99)
Glucose-Capillary: 253 mg/dL — ABNORMAL HIGH (ref 70–99)
Glucose-Capillary: 253 mg/dL — ABNORMAL HIGH (ref 70–99)
Glucose-Capillary: 266 mg/dL — ABNORMAL HIGH (ref 70–99)

## 2022-10-14 LAB — BASIC METABOLIC PANEL WITH GFR
Anion gap: 13 (ref 5–15)
BUN: 84 mg/dL — ABNORMAL HIGH (ref 6–20)
CO2: 20 mmol/L — ABNORMAL LOW (ref 22–32)
Calcium: 7.3 mg/dL — ABNORMAL LOW (ref 8.9–10.3)
Chloride: 97 mmol/L — ABNORMAL LOW (ref 98–111)
Creatinine, Ser: 2.23 mg/dL — ABNORMAL HIGH (ref 0.44–1.00)
GFR, Estimated: 27 mL/min — ABNORMAL LOW
Glucose, Bld: 236 mg/dL — ABNORMAL HIGH (ref 70–99)
Potassium: 4.6 mmol/L (ref 3.5–5.1)
Sodium: 130 mmol/L — ABNORMAL LOW (ref 135–145)

## 2022-10-14 LAB — CBC
HCT: 29.2 % — ABNORMAL LOW (ref 36.0–46.0)
Hemoglobin: 9.3 g/dL — ABNORMAL LOW (ref 12.0–15.0)
MCH: 25.8 pg — ABNORMAL LOW (ref 26.0–34.0)
MCHC: 31.8 g/dL (ref 30.0–36.0)
MCV: 81.1 fL (ref 80.0–100.0)
Platelets: 483 10*3/uL — ABNORMAL HIGH (ref 150–400)
RBC: 3.6 MIL/uL — ABNORMAL LOW (ref 3.87–5.11)
RDW: 14.6 % (ref 11.5–15.5)
WBC: 25.2 10*3/uL — ABNORMAL HIGH (ref 4.0–10.5)
nRBC: 0.2 % (ref 0.0–0.2)

## 2022-10-14 LAB — BASIC METABOLIC PANEL
Anion gap: 14 (ref 5–15)
BUN: 79 mg/dL — ABNORMAL HIGH (ref 6–20)
CO2: 21 mmol/L — ABNORMAL LOW (ref 22–32)
Calcium: 7.6 mg/dL — ABNORMAL LOW (ref 8.9–10.3)
Chloride: 96 mmol/L — ABNORMAL LOW (ref 98–111)
Creatinine, Ser: 2.2 mg/dL — ABNORMAL HIGH (ref 0.44–1.00)
GFR, Estimated: 27 mL/min — ABNORMAL LOW (ref >60)
Glucose, Bld: 202 mg/dL — ABNORMAL HIGH (ref 70–99)
Potassium: 4.4 mmol/L (ref 3.5–5.1)
Sodium: 131 mmol/L — ABNORMAL LOW (ref 135–145)

## 2022-10-14 LAB — PHOSPHORUS
Phosphorus: 6 mg/dL — ABNORMAL HIGH (ref 2.5–4.6)
Phosphorus: 6.1 mg/dL — ABNORMAL HIGH (ref 2.5–4.6)

## 2022-10-14 LAB — MAGNESIUM
Magnesium: 2 mg/dL (ref 1.7–2.4)
Magnesium: 1.7 mg/dL (ref 1.7–2.4)

## 2022-10-14 LAB — TRIGLYCERIDES: Triglycerides: 139 mg/dL

## 2022-10-14 LAB — TACROLIMUS LEVEL: Tacrolimus (FK506) - LabCorp: 84.5 ng/mL — ABNORMAL HIGH (ref 2.0–20.0)

## 2022-10-14 LAB — LACTIC ACID, PLASMA
Lactic Acid, Venous: 1.4 mmol/L (ref 0.5–1.9)
Lactic Acid, Venous: 1.1 mmol/L (ref 0.5–1.9)

## 2022-10-14 MED ORDER — MIDAZOLAM-SODIUM CHLORIDE 100-0.9 MG/100ML-% IV SOLN
0.5000 mg/h | INTRAVENOUS | Status: DC
  Administered 2022-10-14: 7 mg/h via INTRAVENOUS
  Administered 2022-10-14: 0.5 mg/h via INTRAVENOUS
  Administered 2022-10-15: 9 mg/h via INTRAVENOUS
  Administered 2022-10-15: 8 mg/h via INTRAVENOUS
  Administered 2022-10-16 (×2): 10 mg/h via INTRAVENOUS
  Administered 2022-10-16: 9 mg/h via INTRAVENOUS
  Administered 2022-10-17 – 2022-10-18 (×4): 12 mg/h via INTRAVENOUS
  Administered 2022-10-19: 8 mg/h via INTRAVENOUS
  Filled 2022-10-14 (×12): qty 100

## 2022-10-14 MED ORDER — FUROSEMIDE 10 MG/ML IJ SOLN
40.0000 mg | Freq: Once | INTRAMUSCULAR | Status: AC
  Administered 2022-10-14: 40 mg via INTRAVENOUS
  Filled 2022-10-14: qty 4

## 2022-10-14 MED ORDER — ROCURONIUM BROMIDE 10 MG/ML (PF) SYRINGE
100.0000 mg | PREFILLED_SYRINGE | INTRAVENOUS | Status: AC
  Administered 2022-10-14: 100 mg via INTRAVENOUS
  Filled 2022-10-14: qty 10

## 2022-10-14 MED ORDER — CALCIUM GLUCONATE-NACL 2-0.675 GM/100ML-% IV SOLN
2.0000 g | Freq: Once | INTRAVENOUS | Status: AC
  Administered 2022-10-14: 2000 mg via INTRAVENOUS
  Filled 2022-10-14: qty 100

## 2022-10-14 MED ORDER — POLYETHYLENE GLYCOL 3350 17 G PO PACK
17.0000 g | PACK | Freq: Two times a day (BID) | ORAL | Status: DC
  Administered 2022-10-14 – 2022-10-19 (×11): 17 g
  Filled 2022-10-14 (×13): qty 1

## 2022-10-14 MED ORDER — FUROSEMIDE 10 MG/ML IJ SOLN
60.0000 mg | Freq: Four times a day (QID) | INTRAMUSCULAR | Status: DC
  Administered 2022-10-14: 60 mg via INTRAVENOUS
  Filled 2022-10-14: qty 6

## 2022-10-14 MED ORDER — METHYLPREDNISOLONE SODIUM SUCC 125 MG IJ SOLR
65.0000 mg | Freq: Two times a day (BID) | INTRAMUSCULAR | Status: DC
  Administered 2022-10-14 – 2022-10-17 (×6): 65 mg via INTRAVENOUS
  Filled 2022-10-14 (×6): qty 2

## 2022-10-14 MED ORDER — SENNA 8.6 MG PO TABS
1.0000 | ORAL_TABLET | Freq: Two times a day (BID) | ORAL | Status: DC
  Administered 2022-10-14 – 2022-10-15 (×3): 8.6 mg
  Filled 2022-10-14 (×3): qty 1

## 2022-10-14 MED ORDER — ROCURONIUM BROMIDE 10 MG/ML (PF) SYRINGE
120.0000 mg | PREFILLED_SYRINGE | Freq: Once | INTRAVENOUS | Status: AC
  Administered 2022-10-14: 120 mg via INTRAVENOUS
  Filled 2022-10-14: qty 20

## 2022-10-14 MED ORDER — ETOMIDATE 2 MG/ML IV SOLN
20.0000 mg | INTRAVENOUS | Status: AC
  Administered 2022-10-14: 20 mg via INTRAVENOUS
  Filled 2022-10-14: qty 10

## 2022-10-14 MED ORDER — MIDAZOLAM HCL 2 MG/2ML IJ SOLN
2.0000 mg | Freq: Once | INTRAMUSCULAR | Status: AC
  Administered 2022-10-14: 2 mg via INTRAVENOUS
  Filled 2022-10-14: qty 2

## 2022-10-14 NOTE — Progress Notes (Signed)
NAME:  Linda Ellison, MRN:  [id number], DOB:  02-04-1976, LOS: 4 ADMISSION DATE:  10/09/2022, CONSULTATION DATE: 10/12/2022 REFERRING MD: Triad, CHIEF COMPLAINT: Respiratory distress  History of Present Illness:  47 year old female with extensive past medical history is well-documented below who was admitted 2 days prior to this consult with multifocal pneumonia.  She has had increasing work of breathing with a respiratory rate of 46.  She had a CT scan that demonstrates bilateral multifocal pneumonia, elevated BNP, and increased work of breathing that will require her being transferred to the intensive care unit.  She has noted a nonproductive cough prior to admission.  Increasing shortness of breath prior to admission.  And increasing lower extremity edema.  She is status post cadaveric renal transplant and she is followed by nephrology.  Currently she is hypokalemic with creatinine in the 2.2 range.  Due to her increasing work of breathing impending respiratory failure she will be transferred to intensive care unit for further evaluation and treatment.  She has already been started on aggressive diuresis and steroids and empirical antimicrobial therapy.  Pertinent  Medical History   Past Medical History:  Diagnosis Date   Allergy    Anemia associated with chronic renal failure    Anxiety    Asthma    Atypical chest pain    long-standing -- normal cardio cath 06-27-2012 and normal nuclear stress test 06-25-2016   AV (arteriovenous fistula) (HCC)    for dialysis-- currently located left radiocephalic   CAD (coronary artery disease) cardiologist-- dr Sharol Roussel [facility] cardiology in [place])   a. False positive stress echo 06/2012 at [facility] - cath with no obstructive CAD at [facility] (mild luminal irregularities in LAD, moderate diffuse disease in distal RCA).   Depression    Elevated lipids    ESRD on hemodialysis [facility]) NEPHROLOGIST-  DR MATTINGLY   started dialysis 01/29/11:  F[facility] on MWF   Gastroparesis    GERD (gastroesophageal reflux disease)    History of pneumonia    HCAP 04/ 2017   Hyperlipidemia    Hyperthyroidism    Menorrhagia    Other secondary hypertension    associated to diabetes -- followed by cardiologist ( dr Sharol Roussel)     Peripheral neuropathy    PONV (postoperative nausea and vomiting)    Pre-transplant evaluation for kidney transplant    [facility]   Renal failure syndrome 03/05/2022   Secondary hyperparathyroidism of renal origin [facility])    Sleep apnea    Type 2 diabetes mellitus, with long-term current use of insulin (HCC)    dx 1985     Significant Hospital Events: Including procedures, antibiotic start and stop dates in addition to other pertinent events   07/09 Transferred to ICU  07/10: Intubated   Interim History / Subjective:  Overnight worsening respiratory status, multiple ABG showing significantly worsening oxygenation status.  Patient bedside this morning.  Not arousable.  Not following instructions.  Objective   Blood pressure (!) 173/71, pulse 85, temperature (!) 96.3 F (35.7 C), resp. rate (!) 30, height 5\' 6"  (1.676 m), weight 127.6 kg, SpO2 99%.  Full vent support.  On fentanyl, insulin, midazolam, Zosyn, Bactrim, propofol    Vent Mode: PRVC FiO2 (%):  [50 %-80 %] 70 % Set Rate:  [30 bmp] 30 bmp Vt Set:  [470 mL] 470 mL PEEP:  [10 cmH20-14 cmH20] 14 cmH20 Plateau Pressure:  [21 cmH20-27 cmH20] 27 cmH20   Intake/Output Summary (  Last 24 hours) at 10/14/2022 1240 Last data filed at 10/14/2022 1100 Gross per 24 hour  Intake 6150.01 ml  Output 1650 ml  Net 4500.01 ml   Filed Weights   10/10/22 0606 10/12/22 1630 10/14/22 0500  Weight: 119 kg 118.7 kg 127.6 kg    Examination: General: Intubated and sedated, critically ill HENT: Normocephalic, atraumatic Lungs: Bilateral coarse ventilated breath sounds.  Decreased breath sounds  throughout. Cardiovascular: Regular rate and rhythm, no murmurs, rubs, gallops Abdomen: Soft,  nondistended Extremities: 2+ pitting edema bilaterally Neuro: Intubated and sedated  Output: 350 mL  Labs: Glucose: 194-253 Blood gas pH 7.319, pCO2 41, pO2 70, Phosphorus 6.0 Magnesium 2.0 CBC white count 25.2, hemoglobin 9.3, platelet 43 BMP sodium 130, bicarb 20, creatinine 2.23,  Micro: Few normal respiratory flora.  No Staph aureus or Pseudomonas. BAL: No organisms so far  Resolved Hospital Problem list     Assessment & Plan:   #Acute respiratory distress syndrome secondary to multifocal pneumonia #Immunocompromised on tacrolimus #Concern for PJP Overnight, patient with worsening oxygenation.  Patient has PF ratio less than 150.  Could be related to volume overload.  Could also be worsening ARDS.  Patient is on Bactrim and Zosyn.  Cultures still not revealing much.  Will continue to follow.  Continue serial ABGs.  Given worsening respiratory status and PF ratio less than 150, will plan to prone today. -Continue to follow cultures -Bactrim day 2 -Zosyn day 4 -Continue prednisone 40 mg twice daily -Continue bronchodilators -Vap bundle -Prone today for 16 hours -Serial ABGs  #History of renal transplant secondary to acute renal failure #Electrolyte derangements Patient did start Bactrim yesterday, do anticipate that is why creatinine increased.  Patient still has good urine output.  Patient is having some volume retention.  Do anticipate this is playing some role in respiratory distress.  Will give Lasix.  Nephrology following. -Replete electrolytes as needed -Nephrology following, appreciate recommendations  -Follow tacrolimus level as it was supratherapeutic  #Type 1 diabetes Patient has a history of type 1 diabetes.  Patient currently on Endo tool to treat and maintain sugar levels. -Continue Endo tool -Diabetic coordinator following -Pharmacy following  -Glucose  measuring well  #Anemia of chronic disease No acute concern for drop in hemoglobin at this time. -Monitor CBC  Best Practice (right click and "Reselect all SmartList Selections" daily)   Diet/type: Regular consistency (see orders) DVT prophylaxis: prophylactic heparin  GI prophylaxis: PPI Lines: N/A Foley:  N/A Code Status:  full code Last date of multidisciplinary goals of care discussion [tbd]  Labs   CBC: Recent Labs  Lab 10/09/22 2009 10/10/22 0216 10/11/22 0857 10/12/22 0318 10/13/22 0123 10/13/22 0436 10/14/22 0221 10/14/22 0328 10/14/22 0627 10/14/22 1002 10/14/22 1214  WBC 16.9* 16.9* 26.9* 25.3* 33.0*  --  25.2*  --   --   --   --   NEUTROABS 13.5*  --  23.9* 22.5*  --   --   --   --   --   --   --   HGB 9.9* 9.6* 10.3* 10.5* 10.1*   < > 9.3* 11.2* 9.9* 10.2* 10.2*  HCT 29.8* 29.3* 31.9* 32.5* 30.9*   < > 29.2* 33.0* 29.0* 30.0* 30.0*  MCV 77.0* 77.3* 81.4 80.8 78.2*  --  81.1  --   --   --   --   PLT 365 351 411* 397 550*  --  483*  --   --   --   --    < > =  values in this interval not displayed.    Basic Metabolic Panel: Recent Labs  Lab 10/09/22 2009 10/10/22 0216 10/10/22 0636 10/11/22 8119 10/12/22 1478 10/12/22 2956 10/13/22 0129 10/13/22 0436 10/13/22 1600 10/14/22 0014 10/14/22 0221 10/14/22 0328 10/14/22 0627 10/14/22 1002 10/14/22 1214  NA 131*   < > 134* 133* 133* 134* 135   < >  --    < > 130* 132* 131* 132* 133*  K 2.5*   < > 3.2* 4.4 6.9* 5.7* 4.4   < >  --    < > 4.6 4.6 4.4 4.4 4.6  CL 94*   < > 99 99 100 100 101  --   --   --  97*  --   --   --   --   CO2 20*   < > 21* 18* 15* 18* 20*  --   --   --  20*  --   --   --   --   GLUCOSE 138*   < > 96 236* 365* 332* 148*  --   --   --  236*  --   --   --   --   BUN 71*   < > 67* 72* 87* 88* 93*  --   --   --  84*  --   --   --   --   CREATININE 5.05*   < > 4.06* 2.21* 2.20* 2.24* 2.14*  --   --   --  2.23*  --   --   --   --   CALCIUM 6.8*   < > 7.0* 7.5* 7.8* 7.7* 7.7*  --   --    --  7.3*  --   --   --   --   MG 1.5*  --  1.4* 1.8  --   --   --   --  1.9  --  2.0  --   --   --   --   PHOS  --   --   --  4.3 4.8*  --  3.9  --  6.1*  --  6.0*  --   --   --   --    < > = values in this interval not displayed.   GFR: Estimated Creatinine Clearance: 43.1 mL/min (A) (by C-G formula based on SCr of 2.23 mg/dL (H)). Recent Labs  Lab 10/11/22 0857 10/12/22 0318 10/13/22 0123 10/14/22 0221  PROCALCITON  --   --  0.51  --   WBC 26.9* 25.3* 33.0* 25.2*    Liver Function Tests: Recent Labs  Lab 10/10/22 0216 10/11/22 0857 10/12/22 0318 10/13/22 0129  AST 13*  --   --   --   ALT 17  --   --   --   ALKPHOS 180*  --   --   --   BILITOT 0.6  --   --   --   PROT 6.8  --   --   --   ALBUMIN 2.6* 2.6* 2.6* 2.5*   No results for input(s): "LIPASE", "AMYLASE" in the last 168 hours. No results for input(s): "AMMONIA" in the last 168 hours.  ABG    Component Value Date/Time   PHART 7.402 10/14/2022 1214   PCO2ART 35.6 10/14/2022 1214   PO2ART 105 10/14/2022 1214   HCO3 22.6 10/14/2022 1214   TCO2 24 10/14/2022 1214   ACIDBASEDEF 2.0 10/14/2022 1214   O2SAT 98 10/14/2022 1214  Coagulation Profile: No results for input(s): "INR", "PROTIME" in the last 168 hours.  Cardiac Enzymes: No results for input(s): "CKTOTAL", "CKMB", "CKMBINDEX", "TROPONINI" in the last 168 hours.  HbA1C: Hgb A1c MFr Bld  Date/Time Value Ref Range Status  08/27/2022 03:43 AM 6.7 (H) 4.8 - 5.6 % Final    Comment:    (NOTE)         Prediabetes: 5.7 - 6.4         Diabetes: >6.4         Glycemic control for adults with diabetes: <7.0   11/21/2017 06:49 PM 9.9 (H) 4.8 - 5.6 % Final    Comment:    (NOTE) Pre diabetes:          5.7%-6.4% Diabetes:              >6.4% Glycemic control for   <7.0% adults with diabetes     CBG: Recent Labs  Lab 10/14/22 0724 10/14/22 0854 10/14/22 1000 10/14/22 1107 10/14/22 1210  GLUCAP 210* 160* 135* 120* 109*    Review of Systems:    10 point review of system taken, please see HPI for positives and negatives.   Past Medical History:  She,  has a past medical history of Allergy, Anemia associated with chronic renal failure, Anxiety, Asthma, Atypical chest pain, AV (arteriovenous fistula) (HCC), CAD (coronary artery disease) (cardiologist-- dr Sharol Roussel [facility] cardiology in [place])), Depression, Elevated lipids, ESRD on hemodialysis (HCC) (NEPHROLOGIST-  DR MATTINGLY), Gastroparesis, GERD (gastroesophageal reflux disease), History of pneumonia, Hyperlipidemia, Hyperthyroidism, Menorrhagia, Other secondary hypertension, Peripheral neuropathy, PONV (postoperative nausea and vomiting), Pre-transplant evaluation for kidney transplant, Renal failure syndrome (03/05/2022), Secondary hyperparathyroidism of renal origin [facility]), Sleep apnea, and Type 2 diabetes mellitus, with long-term current use of insulin (HCC).   Surgical History:   Past Surgical History:  Procedure Laterality Date   AV FISTULA PLACEMENT  08/2010   Left radiocephalic AVF   AV FISTULA PLACEMENT Right 05/07/2014   Procedure: ARTERIOVENOUS (AV) FISTULA CREATION;  Surgeon: Sherren Kerns, MD;  Location: [facility] OR;  Service: Vascular;  Laterality: Right;   AV FISTULA PLACEMENT Right 07/02/2014   Procedure: INSERTION OF ARTERIOVENOUS (AV) GORE-TEX GRAFT ARM;  Surgeon: Sherren Kerns, MD;  Location: [facility] OR;  Service: Vascular;  Laterality: Right;   CARDIOVASCULAR STRESS TEST  06/25/2016   [facility]   normal nuclear perfusion study w/ no ischemia/  normal LV function and wall motion , ef 51%   CESAREAN SECTION  03/22/2001   w/ Bilateral Tubal Ligation   COLONOSCOPY     COLONOSCOPY WITH ESOPHAGOGASTRODUODENOSCOPY (EGD)  10/19/2011   DILATION AND CURETTAGE OF UTERUS  05/12/2000   w/ suction for missed ab   DOBUTAMINE STRESS ECHO  06/04/2016    [facility]   normal stress echo w/ no chest pain or ischemia/  normal LV function and wall motion , stress ef 60-65%    ESOPHAGOGASTRODUODENOSCOPY  10/19/2011   Dr. Stan Head   FEMUR IM NAIL Left child   removed   FOOT SURGERY Left 2014 approx.   HYSTEROSCOPY WITH NOVASURE N/A 08/19/2016   Procedure: HYSTEROSCOPY WITH NOVASURE;  Surgeon: Huel Cote, MD;  Location: [facility];  Service: Gynecology;  Laterality: N/A;   KIDNEY TRANSPLANT  09/18/2016   LAPAROSCOPIC CHOLECYSTECTOMY  1994   LEFT HEART CATHETERIZATION WITH CORONARY ANGIOGRAM N/A 06/27/2012   Procedure: LEFT HEART CATHETERIZATION WITH CORONARY ANGIOGRAM;  Surgeon: Laurey Morale, MD;  Location: [facility] CATH LAB;  Service: Cardiovascular;  Laterality: N/A; No ostructive CAD, normal LVF, ef 55-60% (mild LAD luminal irregularities, moderate dRCA diffuse disease)   LIGATION GORETEX FISTULA  01/04/11   Left AVF   LIGATION OF ARTERIOVENOUS  FISTULA Left 08/21/2014   Procedure: LIGATION OF ARTERIOVENOUS  FISTULA;  Surgeon: Annice Needy, MD;  Location: [facility] ORS;  Service: Vascular;  Laterality: Left;   LIGATION OF ARTERIOVENOUS  FISTULA Left 06/29/2017   Procedure: LIGATION OF ARTERIOVENOUS  FISTULA ( RADIOCEPHALIC );  Surgeon: Annice Needy, MD;  Location: [facility] ORS;  Service: Vascular;  Laterality: Left;   NEPHRECTOMY TRANSPLANTED ORGAN     PERIPHERAL VASCULAR CATHETERIZATION Left 08/08/2014   Procedure: Upper Extremity Angiography;  Surgeon: Annice Needy, MD;  Location: [facility] INVASIVE CV LAB;  Service: Cardiovascular;  Laterality: Left;   PERIPHERAL VASCULAR CATHETERIZATION Left 08/08/2014   Procedure: Upper Extremity Intervention;  Surgeon: Annice Needy, MD;  Location: [facility] INVASIVE CV LAB;  Service: Cardiovascular;  Laterality: Left;   PERIPHERAL VASCULAR CATHETERIZATION Left 03/08/2016   Procedure: A/V Fistulagram;  Surgeon: Annice Needy, MD;  Location: [facility] INVASIVE CV LAB;  Service: Cardiovascular;  Laterality: Left;   POLYPECTOMY     THROMBECTOMY AND REVISION OF ARTERIOVENTOUS (AV) GORETEX  GRAFT Right 07/16/2014   Procedure: THROMBECTOMY   Right  arm  ARTERIOVENOUS  GORETEX  GRAFT;  Surgeon: Sherren Kerns, MD;  Location: [facility] OR;  Service: Vascular;  Laterality: Right;   TRANSTHORACIC ECHOCARDIOGRAM  06/04/2016   mild concentric LVH, ef 55-60%,  mild TR,  borderline LAE,  trivial MR and PR     Social History:   reports that she has never smoked. She has never used smokeless tobacco. She reports that she does not drink alcohol and does not use drugs.   Family History:  Her family history includes Arthritis in her brother; Diabetes in her father, maternal aunt, and paternal aunt; Heart disease in her brother, mother, and paternal aunt; Hypertension in her father, maternal aunt, and paternal aunt; Kidney disease in her maternal grandmother and mother; Kidney failure in her paternal aunt and paternal uncle. There is no history of Colon cancer, Colon polyps, Crohn's disease, Esophageal cancer, Rectal cancer, Stomach cancer, or Ulcerative colitis.   Allergies Allergies  Allergen Reactions   Ciprofloxacin Hives, Itching and Nausea And Vomiting   Fluocinolone Itching, Nausea And Vomiting and Other (See Comments)   Hydromorphone Hives   Strawberry Extract Itching and Other (See Comments)    Pt states that this medication makes her tongue raw.   No reaction   Hydromorphone Hcl Hives   Phenytoin Sodium Extended Itching   Adhesive [Tape] Itching and Rash   Chlorhexidine Gluconate Itching   Clindamycin Diarrhea, Nausea And Vomiting and Rash   Shrimp [Shellfish Allergy] Rash     Home Medications  Prior to Admission medications   Medication Sig Start Date End Date Taking? Authorizing Provider  albuterol (PROVENTIL HFA;VENTOLIN HFA) 108 (90 Base) MCG/ACT inhaler Inhale 2 puffs into the lungs every 6 (six) hours as needed for wheezing or shortness of breath. 11/25/17  Yes Nwoko, Agnes I, NP  amLODipine (NORVASC) 10 MG tablet Take 1 tablet by mouth daily. 02/11/22  Yes [provider]  ARIPiprazole (ABILIFY) 30 MG tablet Take 30  mg by mouth daily. 01/26/22  Yes [provider]  atorvastatin (LIPITOR) 10 MG tablet Take 10 mg by mouth daily. 02/08/22  Yes [provider]  buPROPion (WELLBUTRIN XL) 150 MG 24 hr tablet Take 1 tablet (150 mg total)  by mouth daily after breakfast. For depression Patient taking differently: Take 150 mg by mouth daily after breakfast. Take with 300mg  for a total daily dose of 450mg  for depression 11/26/17  Yes Nwoko, Nicole Kindred I, NP  buPROPion (WELLBUTRIN XL) 300 MG 24 hr tablet Take 300 mg by mouth daily.   Yes [provider]  cetirizine (ZYRTEC) 10 MG tablet Take 10 mg by mouth 2 (two) times daily.   Yes [provider]  cinacalcet (SENSIPAR) 30 MG tablet Take 30 mg by mouth daily. 03/10/22  Yes [provider]  citalopram (CELEXA) 40 MG tablet Take 40 mg by mouth daily.   Yes [provider]  cloNIDine (CATAPRES) 0.3 MG tablet Take 0.3 mg by mouth 3 (three) times daily.    Yes [provider]  Dulaglutide 4.5 MG/0.5ML SOPN Inject 4.5 mg into the skin once a week. Every Tuesday 12/09/21  Yes [provider]  gabapentin (NEURONTIN) 300 MG capsule Take 300 mg by mouth at bedtime as needed. pain   Yes [provider]  hydrOXYzine (ATARAX) 25 MG tablet Take 25 mg by mouth at bedtime.   Yes [provider]  insulin aspart (NOVOLOG) 100 UNIT/ML injection USE UP TO 100 UNITS ONCE DAILY IN INSULIN PUMP. DX CODE E11.42 03/10/22  Yes [provider]  Insulin Human (INSULIN PUMP) SOLN Inject 1 each into the skin See admin instructions.   Yes [provider]  labetalol (NORMODYNE) 200 MG tablet Take 2 tablets (400 mg total) by mouth 3 (three) times daily. For high blood pressure 11/25/17  Yes Nwoko, Agnes I, NP  montelukast (SINGULAIR) 10 MG tablet Take 10 mg by mouth at bedtime.   Yes [provider]  mycophenolate (MYFORTIC) 180 MG EC tablet Take 720 mg by mouth 2 (two) times daily.   Yes [provider]  Oxycodone HCl 10 MG TABS Take 10 mg by mouth See admin instructions. Take 1 tablet by mouth by mouth every 4 to 6 hours as needed for sciatic nerve and back pain   Yes [provider]  pantoprazole (PROTONIX) 40 MG tablet Take 1 tablet (40 mg total) by mouth daily. For acid reflux 11/26/17  Yes Nwoko, Nicole Kindred I, NP  prazosin (MINIPRESS) 1 MG capsule Take 1 mg by mouth at bedtime.   Yes [provider]  predniSONE (DELTASONE) 5 MG tablet Take 5 mg by mouth daily with breakfast.   Yes [provider]  simvastatin (ZOCOR) 10 MG tablet Take 1 tablet (10 mg total) by mouth at bedtime. For high cholesterol Patient taking differently: Take 10 mg by mouth daily. For high cholesterol 11/25/17  Yes Nwoko, Nicole Kindred I, NP  sodium bicarbonate 650 MG tablet Take 1,300 mg by mouth 2 (two) times daily.   Yes [provider]  sulfamethoxazole-trimethoprim (BACTRIM,SEPTRA) 400-80 MG tablet Take 1 tablet by mouth 3 (three) times a week. For infection Patient taking differently: Take 1 tablet by mouth every Monday, Wednesday, and Friday. For infection 11/28/17  Yes Nwoko, Nicole Kindred I, NP  tacrolimus (PROGRAF) 1 MG capsule Take 4-5 mg by mouth See admin instructions. Take 5 capsules by mouth in the morning and 4 capsules at night   Yes [provider]  zolpidem (AMBIEN) 5 MG tablet Take 1 tablet (5 mg total) by mouth at bedtime as needed for sleep. Patient taking differently: Take 5 mg by mouth at bedtime. 11/25/17  Yes Armandina Stammer I, NP  naloxone [facility]) nasal spray 4 mg/0.1 mL  10/28/21   [provider]     Critical care time: 34 min    Modena Slater DO Internal Medicine Resident PGY-2 [phone] 10/14/2022, 12:40 PM

## 2022-10-14 NOTE — Inpatient Diabetes Management (Signed)
Inpatient Diabetes Program Recommendations  AACE/ADA: New Consensus Statement on Inpatient Glycemic Control (2015)  Target Ranges:  Prepandial:   less than 140 mg/dL      Peak postprandial:   less than 180 mg/dL (1-2 hours)      Critically ill patients:  140 - 180 mg/dL    Latest Reference Range & Units 10/14/22 00:00 10/14/22 01:10 10/14/22 02:13 10/14/22 03:15 10/14/22 04:47 10/14/22 06:12 10/14/22 07:24  Glucose-Capillary 70 - 99 mg/dL 161 (H) 096 (H) 045 (H) 217 (H) 194 (H) 212 (H) 210 (H)  (H): Data is abnormally high    Diabetes history: DM1  Outpatient Diabetes medications: Insulin Pump Pump setting per Endo:   Basal: MN-1800 - 1.5               1800 - 1.6 - Total 36.9 units/24H CF - 20 with Goal of 110 ICR: 1:4  Current orders: IV insulin    Given pt's current clinical picture and CBGs remain >180 with High IV Insulin Drip Rates, please leave pt on the IV Insulin Drip for now until condition improves and IV Insulin Drip rates drop significantly    --Will follow patient during hospitalization--  Ambrose Finland RN, MSN, CDCES Diabetes Coordinator Inpatient Glycemic Control Team Team Pager: [phone] (8a-5p)

## 2022-10-14 NOTE — Progress Notes (Signed)
ETT exchanged at this time by CCM. No complications noted. Patient placed back on ventilator at this time on same settings. Pt tolerated procedure well.

## 2022-10-14 NOTE — Procedures (Signed)
Intubation Procedure Note  Linda Ellison  [id number]  November 25, 1975  Date:10/14/22  Time:8:10 AM   Provider Performing:Daniella Dewberry    Procedure: Intubation (31500)  Indication(s) Respiratory Failure  Consent Risks of the procedure as well as the alternatives and risks of each were explained to the patient and/or caregiver.  Consent for the procedure was obtained and is signed in the bedside chart   Anesthesia Etomidate and Rocuronium   Time Out Verified patient identification, verified procedure, site/side was marked, verified correct patient position, special equipment/implants available, medications/allergies/relevant history reviewed, required imaging and test results available.   Sterile Technique Usual hand hygeine, masks, and gloves were used   Procedure Description Patient positioned in bed supine.  Sedation given as noted above.  Patient was intubated with endotracheal tube using Glidescope.  View was Grade 1 full glottis .  Number of attempts was 1.  Colorimetric CO2 detector was consistent with tracheal placement.   Complications/Tolerance None; patient tolerated the procedure well. Chest X-ray is ordered to verify placement.   EBL Minimal   Specimen(s) None

## 2022-10-14 NOTE — Progress Notes (Signed)
PT Cancellation Note  Patient Details Name: Linda Ellison MRN: [id number] DOB: 03-22-1976   Cancelled Treatment:    Reason Eval/Treat Not Completed: Patient not medically ready (remains intubated and awaiting proning. not currently appropriate and will sign off and await new order)   Linda Ellison 10/14/2022, 7:37 AM Merryl Hacker, PT Acute Rehabilitation Services Office: [phone]

## 2022-10-14 NOTE — Progress Notes (Signed)
[facility] Progress Note   Subjective:  Remains intubated.  Bronch yesterday, studies pending.  ETT leak, changing this AM.  UOP 1.3L yesterday but net + 10.9L for admit.  Objective Vitals:   10/14/22 0915 10/14/22 0930 10/14/22 0945 10/14/22 1000  BP:      Pulse: 95 95 85 85  Resp: (!) 30 (!) 30 (!) 30 (!) 30  Temp:      TempSrc:      SpO2: 100% 100% 100% 100%  Weight:      Height:       Physical Exam General appearance: intubated and sedated Resp: on vent, rhonchi throughout Cardio: regular rate and rhythm, S1, S2 normal, no murmur, click, rub or gallop GI: soft, non-tender; bowel sounds normal; no masses,  no organomegaly and renal allograft in RLQ, no tenderness or bruits. Extremities: 1+ diffuse edema  Additional Objective Labs: Basic Metabolic Panel: Recent Labs  Lab 10/12/22 0714 10/13/22 0129 10/13/22 0436 10/13/22 1600 10/14/22 0014 10/14/22 0221 10/14/22 0328 10/14/22 0627 10/14/22 1002  NA 134* 135   < >  --    < > 130* 132* 131* 132*  K 5.7* 4.4   < >  --    < > 4.6 4.6 4.4 4.4  CL 100 101  --   --   --  97*  --   --   --   CO2 18* 20*  --   --   --  20*  --   --   --   GLUCOSE 332* 148*  --   --   --  236*  --   --   --   BUN 88* 93*  --   --   --  84*  --   --   --   CREATININE 2.24* 2.14*  --   --   --  2.23*  --   --   --   CALCIUM 7.7* 7.7*  --   --   --  7.3*  --   --   --   PHOS  --  3.9  --  6.1*  --  6.0*  --   --   --    < > = values in this interval not displayed.   Liver Function Tests: Recent Labs  Lab 10/10/22 0216 10/11/22 0857 10/12/22 0318 10/13/22 0129  AST 13*  --   --   --   ALT 17  --   --   --   ALKPHOS 180*  --   --   --   BILITOT 0.6  --   --   --   PROT 6.8  --   --   --   ALBUMIN 2.6* 2.6* 2.6* 2.5*   No results for input(s): "LIPASE", "AMYLASE" in the last 168 hours. CBC: Recent Labs  Lab 10/09/22 2009 10/10/22 0216 10/11/22 0857 10/12/22 0318 10/13/22 0123 10/13/22 0436 10/14/22 0221  10/14/22 0328 10/14/22 0627 10/14/22 1002  WBC 16.9* 16.9* 26.9* 25.3* 33.0*  --  25.2*  --   --   --   NEUTROABS 13.5*  --  23.9* 22.5*  --   --   --   --   --   --   HGB 9.9* 9.6* 10.3* 10.5* 10.1*   < > 9.3* 11.2* 9.9* 10.2*  HCT 29.8* 29.3* 31.9* 32.5* 30.9*   < > 29.2* 33.0* 29.0* 30.0*  MCV 77.0* 77.3* 81.4 80.8 78.2*  --  81.1  --   --   --  PLT 365 351 411* 397 550*  --  483*  --   --   --    < > = values in this interval not displayed.   Blood Culture    Component Value Date/Time   SDES BRONCHIAL ALVEOLAR LAVAGE 10/13/2022 0905   SPECREQUEST NONE 10/13/2022 0905   CULT  10/13/2022 0905    NO GROWTH 1 DAY Performed at [facility], [address] [place], [place] [address]    REPTSTATUS PENDING 10/13/2022 2956    Cardiac Enzymes: No results for input(s): "CKTOTAL", "CKMB", "CKMBINDEX", "TROPONINI" in the last 168 hours. CBG: Recent Labs  Lab 10/14/22 0447 10/14/22 0612 10/14/22 0724 10/14/22 0854 10/14/22 1000  GLUCAP 194* 212* 210* 160* 135*   Iron Studies:  No results for input(s): "IRON", "TIBC", "TRANSFERRIN", "FERRITIN" in the last 72 hours.  @lablastinr3 @ Studies/Results: DG Abd Portable 1V  Result Date: 10/14/2022 CLINICAL DATA:  [id number] Encounter for intubation [id number] [phone] Encounter for imaging study to confirm orogastric (OG) tube placement [id number] EXAM: PORTABLE ABDOMEN - 1 VIEW COMPARISON:  Radiograph from 10/13/2022. FINDINGS: The bowel gas pattern is non-obstructive. There is paucity of bowel gas. Small amount of gas noted within the distal stomach. No evidence of pneumoperitoneum, within the limitations of a supine film. No acute osseous abnormalities. The soft tissues are within normal limits. Surgical changes, devices, tubes and lines: Enteric tube is seen with its tip and side hole overlying the left upper quadrant, within the distal stomach. There are surgical clips in the right upper quadrant, typical of a previous cholecystectomy.  IMPRESSION: 1. Enteric tube tip and side hole within the distal stomach. Electronically Signed   By: Jules Schick M.D.   On: 10/14/2022 08:31   DG CHEST PORT 1 VIEW  Result Date: 10/14/2022 CLINICAL DATA:  [id number] Encounter for intubation [id number] [phone] Encounter for imaging study to confirm orogastric (OG) tube placement [id number] EXAM: PORTABLE CHEST 1 VIEW COMPARISON:  Radiograph from earlier the same day. FINDINGS: Redemonstration of diffuse heterogeneous opacities throughout bilateral lungs without significant volume loss. No significant interval change. Differential diagnosis includes ARDS versus multi lobar pneumonia. Bilateral lateral costophrenic angles are clear. Stable cardio-mediastinal silhouette. No acute osseous abnormalities. The soft tissues are within normal limits. Tubes and lines: Endotracheal tube tip is 3.1 from the carina. An enteric tube extends below diaphragm, tip out of the view of the radiograph. IMPRESSION: 1. Endotracheal tube tip 3.1 from the carina. 2. Enteric tube extends below diaphragm, tip out of the view of the radiograph. 3. Stable diffuse heterogeneous opacities throughout bilateral lungs. Differential diagnosis includes ARDS versus multi lobar pneumonia. Electronically Signed   By: Jules Schick M.D.   On: 10/14/2022 08:30   DG CHEST PORT 1 VIEW  Result Date: 10/14/2022 CLINICAL DATA:  Hypoxia EXAM: PORTABLE CHEST 1 VIEW COMPARISON:  10/13/2022 FINDINGS: Endotracheal tube remains in place, unchanged NG tube is in the stomach. Severe diffuse bilateral airspace disease again noted, unchanged. No visible effusions or pneumothorax. Heart is borderline in size. IMPRESSION: Continued severe diffuse bilateral airspace disease, unchanged. Electronically Signed   By: Charlett Nose M.D.   On: 10/14/2022 00:55   DG Abd Portable 1V  Result Date: 10/13/2022 CLINICAL DATA:  47 year old female status post orogastric tube placement. EXAM: PORTABLE ABDOMEN - 1 VIEW COMPARISON:   06/17/2015. FINDINGS: Tip of enteric tube is in the antral pre-pyloric region of the stomach. Surgical clips project over the right upper quadrant of the abdomen, likely from prior cholecystectomy. Lower  pelvis is incompletely imaged. Within the visualized abdomen, there is no pathologic dilatation of small bowel or colon. No definite pneumoperitoneum noted on this single supine image. IMPRESSION: 1. Tip of enteric tube is in the antral pre-pyloric region of the stomach. 2. Nonobstructive bowel gas pattern. Electronically Signed   By: Trudie Reed M.D.   On: 10/13/2022 06:53   Portable Chest x-ray  Result Date: 10/13/2022 CLINICAL DATA:  47 year old female status post intubation. EXAM: PORTABLE CHEST 1 VIEW COMPARISON:  Chest x-ray 10/13/2022. FINDINGS: An endotracheal tube is in place with tip 3.7 cm above the carina. Widespread but patchy and asymmetrically distributed interstitial and airspace disease noted throughout the lungs bilaterally, with overall worsening aeration, most compatible with severe multilobar bilateral pneumonia. Small right pleural effusion. No definite left pleural effusion. No pneumothorax. Pulmonary vasculature is obscured. Heart size is borderline enlarged, accentuated by patient's rotation to the right. IMPRESSION: 1. Support apparatus, as above. 2. Worsening severe multilobar bilateral pneumonia with small right pleural effusion. Electronically Signed   By: Trudie Reed M.D.   On: 10/13/2022 06:45   DG CHEST PORT 1 VIEW  Result Date: 10/13/2022 CLINICAL DATA:  200808 with hypoxia. EXAM: PORTABLE CHEST 1 VIEW COMPARISON:  Chest 10/11/2022, chest CT no contrast 10/12/2022 FINDINGS: 4:18 a.m. Multiple overlying monitor wires. Stable cardiomegaly and central vascular prominence. Extensive bilateral dense patchy airspace disease continues to be seen, with likely underlying interstitial component. Small pleural effusions are unchanged.  The mediastinum is stable. The overall  aeration seems unchanged. No new or worsening findings. No acute osseous findings. IMPRESSION: No significant change from yesterday's CT. Extensive bilateral airspace disease with likely underlying interstitial component and small pleural effusions. Stable cardiomegaly and central vascular prominence. Electronically Signed   By: Almira Bar M.D.   On: 10/13/2022 06:23   ECHOCARDIOGRAM COMPLETE  Result Date: 10/12/2022    ECHOCARDIOGRAM REPORT   Patient Name:   Linda Ellison Date of Exam: 10/12/2022 Medical Rec #:  [id number]      Height:       66.5 in Accession #:    [id number]     Weight:       262.3 lb Date of Birth:  01-Apr-1976      BSA:          2.257 m Patient Age:    46 years       BP:           142/81 mmHg Patient Gender: F              HR:           96 bpm. Exam Location:  Inpatient Procedure: 2D Echo, Color Doppler and Cardiac Doppler Indications:    dyspnea  History:        Patient has prior history of Echocardiogram examinations, most                 recent 12/27/2013. CAD, end stage renal disease; Risk                 Factors:Diabetes, Hypertension, Dyslipidemia and Sleep Apnea.  Sonographer:    Delcie Roch RDCS Referring Phys: [id number] DANIEL V THOMPSON  Sonographer Comments: Image acquisition challenging due to patient body habitus, Image acquisition challenging due to respiratory motion and dyspnea, high flow O2. IMPRESSIONS  1. Left ventricular ejection fraction, by estimation, is 65 to 70%. The left ventricle has hyperdynamic function. The left ventricle has no regional wall motion abnormalities. Left ventricular diastolic parameters  are consistent with Grade II diastolic dysfunction (pseudonormalization). Elevated left atrial pressure.  2. Right ventricular systolic function is normal. The right ventricular size is mildly enlarged. There is moderately elevated pulmonary artery systolic pressure. The estimated right ventricular systolic pressure is 50.8 mmHg.  3. The mitral valve is normal in  structure. Trivial mitral valve regurgitation.  4. Tricuspid valve regurgitation is mild to moderate.  5. The aortic valve is tricuspid. Aortic valve regurgitation is not visualized. No aortic stenosis is present.  6. The inferior vena cava is dilated in size with >50% respiratory variability, suggesting right atrial pressure of 8 mmHg. Comparison(s): Prior images unable to be directly viewed, comparison made by report only. FINDINGS  Left Ventricle: Left ventricular ejection fraction, by estimation, is 65 to 70%. The left ventricle has hyperdynamic function. The left ventricle has no regional wall motion abnormalities. The left ventricular internal cavity size was normal in size. There is borderline concentric left ventricular hypertrophy. Left ventricular diastolic parameters are consistent with Grade II diastolic dysfunction (pseudonormalization). Elevated left atrial pressure. Right Ventricle: The right ventricular size is mildly enlarged. No increase in right ventricular wall thickness. Right ventricular systolic function is normal. There is moderately elevated pulmonary artery systolic pressure. The tricuspid regurgitant velocity is 3.27 m/s, and with an assumed right atrial pressure of 8 mmHg, the estimated right ventricular systolic pressure is 50.8 mmHg. Left Atrium: Left atrial size was normal in size. Right Atrium: Right atrial size was normal in size. Pericardium: Trivial pericardial effusion is present. Mitral Valve: The mitral valve is normal in structure. Trivial mitral valve regurgitation. Tricuspid Valve: The tricuspid valve is normal in structure. Tricuspid valve regurgitation is mild to moderate. Aortic Valve: The aortic valve is tricuspid. Aortic valve regurgitation is not visualized. No aortic stenosis is present. Pulmonic Valve: The pulmonic valve was grossly normal. Pulmonic valve regurgitation is not visualized. No evidence of pulmonic stenosis. Aorta: The aortic root and ascending aorta are  structurally normal, with no evidence of dilitation. Venous: The inferior vena cava is dilated in size with greater than 50% respiratory variability, suggesting right atrial pressure of 8 mmHg. IAS/Shunts: No atrial level shunt detected by color flow Doppler.  LEFT VENTRICLE PLAX 2D LVIDd:         4.50 cm   Diastology LVIDs:         2.70 cm   LV e' medial:    7.83 cm/s LV PW:         1.14 cm   LV E/e' medial:  17.4 LV IVS:        1.10 cm   LV e' lateral:   10.40 cm/s LVOT diam:     1.90 cm   LV E/e' lateral: 13.1 LV SV:         67 LV SV Index:   30 LVOT Area:     2.84 cm  RIGHT VENTRICLE             IVC RV Basal diam:  2.90 cm     IVC diam: 2.40 cm RV S prime:     16.20 cm/s TAPSE (M-mode): 1.8 cm LEFT ATRIUM             Index        RIGHT ATRIUM           Index LA diam:        3.30 cm 1.46 cm/m   RA Area:     13.40 cm LA Vol (A2C):   53.4  ml 23.66 ml/m  RA Volume:   31.30 ml  13.87 ml/m LA Vol (A4C):   49.9 ml 22.11 ml/m LA Biplane Vol: 52.7 ml 23.35 ml/m  AORTIC VALVE LVOT Vmax:   119.00 cm/s LVOT Vmean:  83.600 cm/s LVOT VTI:    0.237 m  AORTA Ao Root diam: 2.80 cm Ao Asc diam:  3.10 cm MITRAL VALVE                TRICUSPID VALVE MV Area (PHT): 4.21 cm     TR Peak grad:   42.8 mmHg MV Decel Time: 180 msec     TR Vmax:        327.00 cm/s MV E velocity: 136.00 cm/s MV A velocity: 81.90 cm/s   SHUNTS MV E/A ratio:  1.66         Systemic VTI:  0.24 m                             Systemic Diam: 1.90 cm Mihai Croitoru MD Electronically signed by Thurmon Fair MD Signature Date/Time: 10/12/2022/3:51:33 PM    Final    Medications:  sodium chloride 10 mL/hr at 10/14/22 1000   calcium gluconate     feeding supplement (VITAL AF 1.2 CAL) 20 mL/hr at 10/14/22 1000   fentaNYL infusion INTRAVENOUS 400 mcg/hr (10/14/22 1000)   insulin 10.5 Units/hr (10/14/22 1000)   midazolam 6 mg/hr (10/14/22 1000)   piperacillin-tazobactam 12.5 mL/hr at 10/14/22 1000    amLODipine  10 mg Per Tube Daily   arformoterol  15 mcg  Nebulization BID   ARIPiprazole  30 mg Per Tube Daily   atorvastatin  10 mg Per Tube Daily   buPROPion  225 mg Per Tube BID   docusate  100 mg Per Tube BID   famotidine  20 mg Per Tube Daily   folic acid  1 mg Per Tube Daily   furosemide  60 mg Intravenous Q6H   guaiFENesin  400 mg Per Tube Q4H   heparin  5,000 Units Subcutaneous Q8H   hydrOXYzine  25 mg Per Tube QHS   loratadine  10 mg Per Tube Daily   methylPREDNISolone (SOLU-MEDROL) injection  65 mg Intravenous Q12H   montelukast  10 mg Per Tube QHS   multivitamin with minerals  1 tablet Per Tube Daily   mouth rinse  15 mL Mouth Rinse Q2H   polyethylene glycol  17 g Per Tube BID   revefenacin  175 mcg Nebulization Daily   senna  1 tablet Per Tube BID   sodium bicarbonate  1,300 mg Per Tube BID   sulfamethoxazole-trimethoprim  550 mg of trimethoprim Intravenous Q6H    Assessment/Plan:  AKI/CKD stage IIIa following DDKT- Baseline in the 1.4-1.6 range it appears. Admission Cr 5 presumably due to ischemic ATN in setting of poor po intake and diarrhea for the past 5-6 days.  Scr has improved to low 2s and stable.  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  Continue daily labs for now.  OK with PRN diuretics for volume - give 40 IV now and if not response redose 80 IV at noon   Avoid nephrotoxic medications including NSAIDs and iodinated intravenous contrast exposure unless the latter is absolutely indicated.  Preferred narcotic agents for pain control are hydromorphone, fentanyl, and methadone. Morphine should not be used. Avoid Baclofen and avoid oral sodium phosphate and magnesium citrate based laxatives / bowel preps. Continue strict Input  and Output monitoring. Will monitor the patient closely with you and intervene or adjust therapy as indicated by changes in clinical status/labs  Acute hypoxic respiratory failure with acute asthma exacerbation  + pneumonia - broad spectrum antibiotics per primary svc.  On prednisone as well.    Non contrasted CT chest with extensive BL multifocal opacities. Required intubation 7/10; s/p bronch with studies pending   As above lasix today.  Immunosuppressive therapy - normally on myfortic 720 mg bid, prograf 5 mg qam and 4 mg qpm, and prednisone 5 mg.  Holding myfortic in setting of severe illness.  Tac level pending 7/7 at 21:50 - level 84.5?? Holding and having pharmacy check timing; resending this AM (hadn't got dose yet) and pharm to see if [facility] can expediate. AGMA - due to #1 and diarrhea.  S/p LR and on po bicarb now - acceptable   CAD - not an acute issue. DM type 2 - per primary svc Anemia of CKD stage III - continue to follow and transfuse for Hgb <7  Will continue to follow, reach out with concerns.   Estill Bakes MD 10/14/2022, 11:05 AM  [name] Pager: [phone]

## 2022-10-14 NOTE — Progress Notes (Signed)
OT Cancellation Note  Patient Details Name: Linda Ellison MRN: [id number] DOB: July 12, 1975   Cancelled Treatment:    Reason Eval/Treat Not Completed: Medical issues which prohibited therapy;Patient not medically ready. Remains intubated and proning.   Linda Ellison, OTR/L [facility] Office: [phone]   Myrla Halsted 10/14/2022, 2:20 PM

## 2022-10-14 NOTE — Procedures (Signed)
Central Venous Catheter Insertion Procedure Note  LORELEE Ellison  [id number]  [birth year]  Date:10/14/22  Time:11:39 AM   Provider Performing:Marlei Glomski Allena Katz   Procedure: Insertion of Non-tunneled Central Venous [phone]) with US guidance (56213)   Indication(s) Difficult access  Consent Risks of the procedure as well as the alternatives and risks of each were explained to the patient and/or caregiver.  Consent for the procedure was obtained and is signed in the bedside chart  Anesthesia Topical only with 1% lidocaine   Timeout Verified patient identification, verified procedure, site/side was marked, verified correct patient position, special equipment/implants available, medications/allergies/relevant history reviewed, required imaging and test results available.  Sterile Technique Maximal sterile technique including full sterile barrier drape, hand hygiene, sterile gown, sterile gloves, mask, hair covering, sterile ultrasound probe cover (if used).  Procedure Description Area of catheter insertion was cleaned with chlorhexidine and draped in sterile fashion.  With real-time ultrasound guidance a central venous catheter was placed into the left internal jugular vein. Nonpulsatile blood flow and easy flushing noted in all ports.  The catheter was sutured in place and sterile dressing applied.  Complications/Tolerance None; patient tolerated the procedure well. Chest X-ray is ordered to verify placement for internal jugular or subclavian cannulation.   Chest x-ray is not ordered for femoral cannulation.  EBL Minimal  Specimen(s) None

## 2022-10-14 NOTE — Progress Notes (Signed)
RT x2 taped pts ETT prior to proning pt. ETT secure at 24cm. No breakdown noted prior to placing tape.

## 2022-10-15 DIAGNOSIS — D849 Immunodeficiency, unspecified: Secondary | ICD-10-CM | POA: Diagnosis not present

## 2022-10-15 DIAGNOSIS — J189 Pneumonia, unspecified organism: Secondary | ICD-10-CM | POA: Diagnosis not present

## 2022-10-15 DIAGNOSIS — N179 Acute kidney failure, unspecified: Secondary | ICD-10-CM | POA: Diagnosis not present

## 2022-10-15 DIAGNOSIS — E1022 Type 1 diabetes mellitus with diabetic chronic kidney disease: Secondary | ICD-10-CM | POA: Diagnosis not present

## 2022-10-15 LAB — CBC WITH DIFFERENTIAL/PLATELET
Abs Immature Granulocytes: 0.9 10*3/uL — ABNORMAL HIGH (ref 0.00–0.07)
Basophils Absolute: 0 10*3/uL (ref 0.0–0.1)
Basophils Relative: 0 %
Eosinophils Absolute: 0 10*3/uL (ref 0.0–0.5)
Eosinophils Relative: 0 %
HCT: 27 % — ABNORMAL LOW (ref 36.0–46.0)
Hemoglobin: 8.6 g/dL — ABNORMAL LOW (ref 12.0–15.0)
Immature Granulocytes: 7 %
Lymphocytes Relative: 3 %
Lymphs Abs: 0.4 10*3/uL — ABNORMAL LOW (ref 0.7–4.0)
MCH: 24.8 pg — ABNORMAL LOW (ref 26.0–34.0)
MCHC: 31.9 g/dL (ref 30.0–36.0)
MCV: 77.8 fL — ABNORMAL LOW (ref 80.0–100.0)
Monocytes Absolute: 0.4 10*3/uL (ref 0.1–1.0)
Monocytes Relative: 3 %
Neutro Abs: 12.3 10*3/uL — ABNORMAL HIGH (ref 1.7–7.7)
Neutrophils Relative %: 87 %
Platelets: 454 10*3/uL — ABNORMAL HIGH (ref 150–400)
RBC: 3.47 MIL/uL — ABNORMAL LOW (ref 3.87–5.11)
RDW: 14.5 % (ref 11.5–15.5)
WBC: 13.9 10*3/uL — ABNORMAL HIGH (ref 4.0–10.5)
nRBC: 0.6 % — ABNORMAL HIGH (ref 0.0–0.2)

## 2022-10-15 LAB — POCT I-STAT 7, (LYTES, BLD GAS, ICA,H+H)
Acid-base deficit: 3 mmol/L — ABNORMAL HIGH (ref 0.0–2.0)
Acid-base deficit: 4 mmol/L — ABNORMAL HIGH (ref 0.0–2.0)
Bicarbonate: 24 mmol/L (ref 20.0–28.0)
Bicarbonate: 22.9 mmol/L (ref 20.0–28.0)
Calcium, Ion: 1.01 mmol/L — ABNORMAL LOW (ref 1.15–1.40)
Calcium, Ion: 1 mmol/L — ABNORMAL LOW (ref 1.15–1.40)
HCT: 30 % — ABNORMAL LOW (ref 36.0–46.0)
HCT: 31 % — ABNORMAL LOW (ref 36.0–46.0)
Hemoglobin: 10.2 g/dL — ABNORMAL LOW (ref 12.0–15.0)
Hemoglobin: 10.5 g/dL — ABNORMAL LOW (ref 12.0–15.0)
O2 Saturation: 99 %
O2 Saturation: 98 %
Patient temperature: 36.7
Patient temperature: 98.1
Potassium: 5.1 mmol/L (ref 3.5–5.1)
Potassium: 5.2 mmol/L — ABNORMAL HIGH (ref 3.5–5.1)
Sodium: 131 mmol/L — ABNORMAL LOW (ref 135–145)
Sodium: 129 mmol/L — ABNORMAL LOW (ref 135–145)
TCO2: 25 mmol/L (ref 22–32)
TCO2: 24 mmol/L (ref 22–32)
pCO2 arterial: 48.5 mmHg — ABNORMAL HIGH (ref 32–48)
pCO2 arterial: 50.1 mmHg — ABNORMAL HIGH (ref 32–48)
pH, Arterial: 7.3 — ABNORMAL LOW (ref 7.35–7.45)
pH, Arterial: 7.267 — ABNORMAL LOW (ref 7.35–7.45)
pO2, Arterial: 151 mmHg — ABNORMAL HIGH (ref 83–108)
pO2, Arterial: 125 mmHg — ABNORMAL HIGH (ref 83–108)

## 2022-10-15 LAB — CULTURE, BLOOD (ROUTINE X 2)
Culture: NO GROWTH
Culture: NO GROWTH
Special Requests: ADEQUATE
Special Requests: ADEQUATE

## 2022-10-15 LAB — CULTURE, BAL-QUANTITATIVE W GRAM STAIN

## 2022-10-15 LAB — GLUCOSE, CAPILLARY
Glucose-Capillary: 116 mg/dL — ABNORMAL HIGH (ref 70–99)
Glucose-Capillary: 123 mg/dL — ABNORMAL HIGH (ref 70–99)
Glucose-Capillary: 131 mg/dL — ABNORMAL HIGH (ref 70–99)
Glucose-Capillary: 135 mg/dL — ABNORMAL HIGH (ref 70–99)
Glucose-Capillary: 140 mg/dL — ABNORMAL HIGH (ref 70–99)
Glucose-Capillary: 150 mg/dL — ABNORMAL HIGH (ref 70–99)
Glucose-Capillary: 155 mg/dL — ABNORMAL HIGH (ref 70–99)
Glucose-Capillary: 156 mg/dL — ABNORMAL HIGH (ref 70–99)
Glucose-Capillary: 157 mg/dL — ABNORMAL HIGH (ref 70–99)
Glucose-Capillary: 162 mg/dL — ABNORMAL HIGH (ref 70–99)
Glucose-Capillary: 168 mg/dL — ABNORMAL HIGH (ref 70–99)
Glucose-Capillary: 176 mg/dL — ABNORMAL HIGH (ref 70–99)
Glucose-Capillary: 186 mg/dL — ABNORMAL HIGH (ref 70–99)
Glucose-Capillary: 190 mg/dL — ABNORMAL HIGH (ref 70–99)
Glucose-Capillary: 193 mg/dL — ABNORMAL HIGH (ref 70–99)
Glucose-Capillary: 201 mg/dL — ABNORMAL HIGH (ref 70–99)
Glucose-Capillary: 217 mg/dL — ABNORMAL HIGH (ref 70–99)
Glucose-Capillary: 217 mg/dL — ABNORMAL HIGH (ref 70–99)
Glucose-Capillary: 227 mg/dL — ABNORMAL HIGH (ref 70–99)
Glucose-Capillary: 228 mg/dL — ABNORMAL HIGH (ref 70–99)
Glucose-Capillary: 235 mg/dL — ABNORMAL HIGH (ref 70–99)
Glucose-Capillary: 235 mg/dL — ABNORMAL HIGH (ref 70–99)

## 2022-10-15 LAB — BASIC METABOLIC PANEL
Anion gap: 18 — ABNORMAL HIGH (ref 5–15)
BUN: 78 mg/dL — ABNORMAL HIGH (ref 6–20)
CO2: 19 mmol/L — ABNORMAL LOW (ref 22–32)
Calcium: 7.3 mg/dL — ABNORMAL LOW (ref 8.9–10.3)
Chloride: 93 mmol/L — ABNORMAL LOW (ref 98–111)
Creatinine, Ser: 2.23 mg/dL — ABNORMAL HIGH (ref 0.44–1.00)
GFR, Estimated: 27 mL/min — ABNORMAL LOW (ref >60)
Glucose, Bld: 194 mg/dL — ABNORMAL HIGH (ref 70–99)
Potassium: 4.9 mmol/L (ref 3.5–5.1)
Sodium: 130 mmol/L — ABNORMAL LOW (ref 135–145)

## 2022-10-15 LAB — PHOSPHORUS: Phosphorus: 6.1 mg/dL — ABNORMAL HIGH (ref 2.5–4.6)

## 2022-10-15 LAB — MAGNESIUM: Magnesium: 1.9 mg/dL (ref 1.7–2.4)

## 2022-10-15 MED ORDER — FUROSEMIDE 10 MG/ML IJ SOLN
40.0000 mg | Freq: Two times a day (BID) | INTRAMUSCULAR | Status: DC
  Administered 2022-10-15 – 2022-10-16 (×3): 40 mg via INTRAVENOUS
  Filled 2022-10-15 (×3): qty 4

## 2022-10-15 MED ORDER — BISACODYL 10 MG RE SUPP
10.0000 mg | Freq: Once | RECTAL | Status: AC
  Administered 2022-10-15: 10 mg via RECTAL
  Filled 2022-10-15: qty 1

## 2022-10-15 MED ORDER — SENNOSIDES-DOCUSATE SODIUM 8.6-50 MG PO TABS
1.0000 | ORAL_TABLET | Freq: Two times a day (BID) | ORAL | Status: DC
  Administered 2022-10-15 – 2022-10-19 (×9): 1
  Filled 2022-10-15 (×11): qty 1

## 2022-10-15 MED ORDER — VITAL AF 1.2 CAL PO LIQD
1000.0000 mL | ORAL | Status: DC
  Administered 2022-10-15 – 2022-10-17 (×3): 1000 mL

## 2022-10-15 MED ORDER — POLYETHYLENE GLYCOL 3350 17 G PO PACK: 17.0000 g | PACK | Freq: Every day | ORAL | Status: DC

## 2022-10-15 MED ORDER — CALCIUM GLUCONATE-NACL 2-0.675 GM/100ML-% IV SOLN
2.0000 g | Freq: Once | INTRAVENOUS | Status: AC
  Administered 2022-10-15: 2000 mg via INTRAVENOUS
  Filled 2022-10-15: qty 100

## 2022-10-15 MED ORDER — SULFAMETHOXAZOLE-TRIMETHOPRIM 400-80 MG/5ML IV SOLN
550.0000 mg | Freq: Four times a day (QID) | INTRAVENOUS | Status: DC
  Administered 2022-10-15 – 2022-10-16 (×4): 550 mg via INTRAVENOUS
  Filled 2022-10-15 (×6): qty 34.38

## 2022-10-15 MED ORDER — SULFAMETHOXAZOLE-TRIMETHOPRIM 400-80 MG/5ML IV SOLN
550.0000 mg | Freq: Three times a day (TID) | INTRAVENOUS | Status: DC
  Filled 2022-10-15 (×2): qty 34.38

## 2022-10-15 MED ORDER — FUROSEMIDE 10 MG/ML IJ SOLN
60.0000 mg | Freq: Once | INTRAMUSCULAR | Status: AC
  Administered 2022-10-15: 60 mg via INTRAVENOUS
  Filled 2022-10-15: qty 6

## 2022-10-15 NOTE — Inpatient Diabetes Management (Addendum)
Inpatient Diabetes Program Recommendations  AACE/ADA: New Consensus Statement on Inpatient Glycemic Control (2015)  Target Ranges:  Prepandial:   less than 140 mg/dL      Peak postprandial:   less than 180 mg/dL (1-2 hours)      Critically ill patients:  140 - 180 mg/dL    Latest Reference Range & Units 10/14/22 23:39 10/15/22 00:46 10/15/22 01:41 10/15/22 02:46 10/15/22 03:42 10/15/22 04:48 10/15/22 05:45  Glucose-Capillary 70 - 99 mg/dL 161 (H)  9.5 units/hr 096 (H)  22 units/hr 235 (H)  25 units/hr 201 (H)  19 units/hr 176 (H)  15 units/hr 156 (H)  11 units/hr 150 (H)  10 units/hr  (H): Data is abnormally high    Diabetes history: DM1   Outpatient Diabetes medications: Insulin Pump Pump setting per Endo:   Basal: MN-1800 - 1.5               1800 - 1.6 - Total 36.9 units/24H CF - 20 with Goal of 110 ICR: 1:4   Current orders: IV insulin     Solumedrol 65 mg BID       Given pt's current clinical picture and CBGs remain >180 with High IV Insulin Drip Rates:  Please leave pt on the IV Insulin Drip for now until condition improves and IV Insulin Drip rates drop significantly    --Will follow patient during hospitalization--  Ambrose Finland RN, MSN, CDCES Diabetes Coordinator Inpatient Glycemic Control Team Team Pager: [phone] (8a-5p)

## 2022-10-15 NOTE — Progress Notes (Signed)
Nutrition Follow-up  DOCUMENTATION CODES:  Obesity unspecified  INTERVENTION:  Tube feeding via OGT: Advance Vital AF 1.2 by 10 mL every 4 hours to a goal rate of 60 ml/h (1440 ml per day) Provides 1728 kcal, 108 gm protein, 1168 ml free water daily  NUTRITION DIAGNOSIS:  Inadequate oral intake related to inability to eat as evidenced by NPO status. - Ongoing   GOAL:  Provide needs based on ASPEN/SCCM guidelines - Ongoing   MONITOR:  TF tolerance, I & O's, Vent status, Labs  REASON FOR ASSESSMENT:  Rounds, Ventilator, Consult Enteral/tube feeding initiation and management  ASSESSMENT:  Pt with hx of prior ESRD on HD now with renal transplant 2018, DM type 2, HTN, HLD, CAD, gastroparesis, and GERD presented to ED with weakness and SOB. Imaging suggestive of pneumonia.  7/6 - admitted 7/9 - worsening SOB, transferred to ICU 7/10 - intubated, bronchoscopy; trickle TF started  TF running at trickle at time of RD visit. MD ok with titrating up TF.   Patient remains intubated on ventilator support. Use of endo tool to assist in CBG control.  MV: 9.3 L/min MAP (a-line): 100 Temp (24hrs), Avg:97.8 F (36.6 C), Min:94.8 F (34.9 C), Max:99.3 F (37.4 C)  Drips Fentanyl Insulin infusion Versed  Medications reviewed and include: Dulcolax, Colace, Pepcid, Folic acid, Solu-medrol, MVI, Miralax, Senokot-S, Bactrim, IV antibiotics  Labs reviewed: Sodium 131, Potassium 5.1, BUN 78, Creatinine 2.23, Phosphorus 6.1, Magnesium 1.9 CBG: 150-266 x 24 hrs  Diet Order:   Diet Order             Diet NPO time specified  Diet effective now                   EDUCATION NEEDS:   Not appropriate for education at this time  Skin:  Skin Assessment: Reviewed RN Assessment  Last BM:  7/9  Height:  Ht Readings from Last 1 Encounters:  10/13/22 5\' 6"  (1.676 m)   Weight:  Wt Readings from Last 1 Encounters:  10/15/22 128.1 kg   Ideal Body Weight:  59.1 kg  BMI:  Body mass  index is 45.58 kg/m.  Estimated Nutritional Needs:  Kcal:  1800-2100kcal/d Protein:  100-120g/d Fluid:  1.8L/d   Kirby Crigler RD, LDN Clinical Dietitian See Loretha Stapler for contact information.

## 2022-10-15 NOTE — Progress Notes (Signed)
[facility] Progress Note   Subjective:  Remains intubated.  Pulm status is improving.  I/Os yest 7.1 / 3.8 - getting lasix today.    Objective Vitals:   10/15/22 0630 10/15/22 0755 10/15/22 0927 10/15/22 1139  BP:   (!) 164/70 (!) 155/65  Pulse: 85 89  99  Resp: (!) 7 (!) 31  (!) 30  Temp: 97.9 F (36.6 C) 97.9 F (36.6 C)  98.1 F (36.7 C)  TempSrc:      SpO2: 100% 100%  100%  Weight:      Height:       Physical Exam General appearance: intubated and sedated Resp: on vent, rhonchi throughout Cardio: regular rate and rhythm, S1, S2 normal, no murmur, click, rub or gallop GI: soft, non-tender; bowel sounds normal; no masses,  no organomegaly and renal allograft in RLQ, no tenderness or bruits. Extremities: 1+ diffuse edema  Additional Objective Labs: Basic Metabolic Panel: Recent Labs  Lab 10/14/22 0221 10/14/22 0328 10/14/22 1422 10/14/22 1431 10/15/22 0243 10/15/22 0550  NA 130*   < > 131* 131* 130* 131*  K 4.6   < > 4.4 4.3 4.9 5.1  CL 97*  --  96*  --  93*  --   CO2 20*  --  21*  --  19*  --   GLUCOSE 236*  --  202*  --  194*  --   BUN 84*  --  79*  --  78*  --   CREATININE 2.23*  --  2.20*  --  2.23*  --   CALCIUM 7.3*  --  7.6*  --  7.3*  --   PHOS 6.0*  --  6.1*  --  6.1*  --    < > = values in this interval not displayed.   Liver Function Tests: Recent Labs  Lab 10/10/22 0216 10/11/22 0857 10/12/22 0318 10/13/22 0129  AST 13*  --   --   --   ALT 17  --   --   --   ALKPHOS 180*  --   --   --   BILITOT 0.6  --   --   --   PROT 6.8  --   --   --   ALBUMIN 2.6* 2.6* 2.6* 2.5*   No results for input(s): "LIPASE", "AMYLASE" in the last 168 hours. CBC: Recent Labs  Lab 10/11/22 0857 10/12/22 0318 10/13/22 0123 10/13/22 0436 10/14/22 0221 10/14/22 0328 10/14/22 1431 10/15/22 0243 10/15/22 0550  WBC 26.9* 25.3* 33.0*  --  25.2*  --   --  13.9*  --   NEUTROABS 23.9* 22.5*  --   --   --   --   --  12.3*  --   HGB 10.3* 10.5*  10.1*   < > 9.3*   < > 10.2* 8.6* 10.2*  HCT 31.9* 32.5* 30.9*   < > 29.2*   < > 30.0* 27.0* 30.0*  MCV 81.4 80.8 78.2*  --  81.1  --   --  77.8*  --   PLT 411* 397 550*  --  483*  --   --  454*  --    < > = values in this interval not displayed.   Blood Culture    Component Value Date/Time   SDES BRONCHIAL ALVEOLAR LAVAGE 10/13/2022 0905   SPECREQUEST NONE 10/13/2022 0905   CULT  10/13/2022 0905    NO GROWTH 1 DAY Performed at [facility], [address] [place], [place] [address]    REPTSTATUS PENDING 10/13/2022 1308    Cardiac Enzymes: No results for input(s): "CKTOTAL", "CKMB", "CKMBINDEX", "TROPONINI" in the last 168 hours. CBG: Recent Labs  Lab 10/15/22 0828 10/15/22 0924 10/15/22 1036 10/15/22 1122 10/15/22 1230  GLUCAP 193* 168* 135* 140* 116*   Iron Studies:  No results for input(s): "IRON", "TIBC", "TRANSFERRIN", "FERRITIN" in the last 72 hours.  @[email or web address] @ Studies/Results: DG CHEST PORT 1 VIEW  Result Date: 10/14/2022 CLINICAL DATA:  47 year old female central line placement. EXAM: PORTABLE CHEST 1 VIEW COMPARISON:  Portable chest 0808 hours today. FINDINGS: Portable AP supine view at 1143 hours. Left IJ approach central line has been placed, tip is at the cavoatrial junction level. Endotracheal tube tip remains in good position between the level the clavicles and carina. Enteric tube courses to the abdomen and the tip is not included. Stable lung volumes. Mediastinal contours remain normal. Coarse and confluent bilateral pulmonary opacity has not significantly changed. No pneumothorax or pleural effusion identified. Stable visualized osseous structures. IMPRESSION: 1. Left IJ approach central line tip at the cavoatrial junction, no adverse features. 2.  Otherwise stable lines and tubes. 3. Stable pulmonary ventilation, bilateral lung opacity since 0808 hours. Electronically Signed   By: Odessa Fleming M.D.   On: 10/14/2022 12:29   DG Abd Portable 1V  Result  Date: 10/14/2022 CLINICAL DATA:  [id number] Encounter for intubation [id number] [phone] Encounter for imaging study to confirm orogastric (OG) tube placement [id number] EXAM: PORTABLE ABDOMEN - 1 VIEW COMPARISON:  Radiograph from 10/13/2022. FINDINGS: The bowel gas pattern is non-obstructive. There is paucity of bowel gas. Small amount of gas noted within the distal stomach. No evidence of pneumoperitoneum, within the limitations of a supine film. No acute osseous abnormalities. The soft tissues are within normal limits. Surgical changes, devices, tubes and lines: Enteric tube is seen with its tip and side hole overlying the left upper quadrant, within the distal stomach. There are surgical clips in the right upper quadrant, typical of a previous cholecystectomy. IMPRESSION: 1. Enteric tube tip and side hole within the distal stomach. Electronically Signed   By: Jules Schick M.D.   On: 10/14/2022 08:31   DG CHEST PORT 1 VIEW  Result Date: 10/14/2022 CLINICAL DATA:  [id number] Encounter for intubation [phone] Encounter for imaging study to confirm orogastric (OG) tube placement [id number] EXAM: PORTABLE CHEST 1 VIEW COMPARISON:  Radiograph from earlier the same day. FINDINGS: Redemonstration of diffuse heterogeneous opacities throughout bilateral lungs without significant volume loss. No significant interval change. Differential diagnosis includes ARDS versus multi lobar pneumonia. Bilateral lateral costophrenic angles are clear. Stable cardio-mediastinal silhouette. No acute osseous abnormalities. The soft tissues are within normal limits. Tubes and lines: Endotracheal tube tip is 3.1 from the carina. An enteric tube extends below diaphragm, tip out of the view of the radiograph. IMPRESSION: 1. Endotracheal tube tip 3.1 from the carina. 2. Enteric tube extends below diaphragm, tip out of the view of the radiograph. 3. Stable diffuse heterogeneous opacities throughout bilateral lungs. Differential diagnosis includes ARDS  versus multi lobar pneumonia. Electronically Signed   By: Jules Schick M.D.   On: 10/14/2022 08:30   DG CHEST PORT 1 VIEW  Result Date: 10/14/2022 CLINICAL DATA:  Hypoxia EXAM: PORTABLE CHEST 1 VIEW COMPARISON:  10/13/2022 FINDINGS: Endotracheal tube remains in place, unchanged NG tube is in the stomach. Severe diffuse bilateral airspace disease again noted, unchanged. No visible effusions or pneumothorax. Heart is borderline in size. IMPRESSION: Continued severe diffuse  bilateral airspace disease, unchanged. Electronically Signed   By: Charlett Nose M.D.   On: 10/14/2022 00:55   Medications:  sodium chloride 10 mL/hr at 10/15/22 0700   feeding supplement (VITAL AF 1.2 CAL) 20 mL/hr at 10/15/22 0700   fentaNYL infusion INTRAVENOUS 400 mcg/hr (10/15/22 1115)   insulin 10 Units/hr (10/15/22 0700)   midazolam 9 mg/hr (10/15/22 1235)   piperacillin-tazobactam 3.375 g (10/15/22 0941)    amLODipine  10 mg Per Tube Daily   arformoterol  15 mcg Nebulization BID   ARIPiprazole  30 mg Per Tube Daily   atorvastatin  10 mg Per Tube Daily   buPROPion  225 mg Per Tube BID   docusate  100 mg Per Tube BID   famotidine  20 mg Per Tube Daily   folic acid  1 mg Per Tube Daily   furosemide  40 mg Intravenous BID   guaiFENesin  400 mg Per Tube Q4H   heparin  5,000 Units Subcutaneous Q8H   hydrOXYzine  25 mg Per Tube QHS   loratadine  10 mg Per Tube Daily   methylPREDNISolone (SOLU-MEDROL) injection  65 mg Intravenous Q12H   montelukast  10 mg Per Tube QHS   multivitamin with minerals  1 tablet Per Tube Daily   mouth rinse  15 mL Mouth Rinse Q2H   polyethylene glycol  17 g Per Tube BID   revefenacin  175 mcg Nebulization Daily   senna  1 tablet Per Tube BID   sodium bicarbonate  1,300 mg Per Tube BID   sulfamethoxazole-trimethoprim  550 mg of trimethoprim Intravenous Q6H    Assessment/Plan:  AKI/CKD stage IIIa following DDKT- Baseline in the 1.4-1.6 range it appears. Admission Cr 5 presumably due to  ischemic ATN in setting of poor po intake and diarrhea for the past 5-6 days.  Scr has improved to low 2s and stable.  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  Continue daily labs for now.  OK with PRN diuretics for volume - getting 40 IV now   Avoid nephrotoxic medications including NSAIDs and iodinated intravenous contrast exposure unless the latter is absolutely indicated.  Preferred narcotic agents for pain control are hydromorphone, fentanyl, and methadone. Morphine should not be used. Avoid Baclofen and avoid oral sodium phosphate and magnesium citrate based laxatives / bowel preps. Continue strict Input and Output monitoring. Will monitor the patient closely with you and intervene or adjust therapy as indicated by changes in clinical status/labs  Acute hypoxic respiratory failure with acute asthma exacerbation  + pneumonia - broad spectrum antibiotics per primary svc.  On prednisone as well.   Non contrasted CT chest with extensive BL multifocal opacities. Required intubation 7/10; s/p bronch with studies unrevealing to date.  On PJP and bacterial PNA tx.  As above lasix today.  Immunosuppressive therapy - normally on myfortic 720 mg bid, prograf 5 mg qam and 4 mg qpm, and prednisone 5 mg.  Holding myfortic in setting of severe illness.  Tac level pending 7/7 at 21:50 - level 84.5 - seemed to be timed correctly for a trough but clearly now.  Hold tac and resent level 7/11 (hadn't got dose yet) and pharm checked if [facility] can expediate but unfortunately they cannot. AGMA - due to #1 and diarrhea.  S/p LR and on po bicarb now - acceptable   CAD - not an acute issue. DM type 2 - per primary svc Anemia of CKD stage III - continue to follow and transfuse for  Hgb <7  Will continue to follow, reach out with concerns.   Estill Bakes MD 10/15/2022, 12:41 PM   [facility] Pager: [phone]

## 2022-10-15 NOTE — Progress Notes (Addendum)
eLink Physician-Brief Progress Note Patient Name: Linda Ellison DOB: 1975-07-12 MRN: [id number]   Date of Service  10/15/2022  HPI/Events of Note  Severe ARDS  eICU Interventions  Order ABG   0607 - ABG reviewed, could likely turn down peep, defer for now.No change  Intervention Category Minor Interventions: Clinical assessment - ordering diagnostic tests  Keelin Neville 10/15/2022, 5:30 AM

## 2022-10-15 NOTE — Progress Notes (Signed)
NAME:  Linda Ellison, MRN:  [id number], DOB:  19-May-1975, LOS: 5 ADMISSION DATE:  10/09/2022, CONSULTATION DATE: 10/12/2022 REFERRING MD: Triad, CHIEF COMPLAINT: Respiratory distress  History of Present Illness:  47 year old female with extensive past medical history is well-documented below who was admitted 2 days prior to this consult with multifocal pneumonia.  She has had increasing work of breathing with a respiratory rate of 46.  She had a CT scan that demonstrates bilateral multifocal pneumonia, elevated BNP, and increased work of breathing that will require her being transferred to the intensive care unit.  She has noted a nonproductive cough prior to admission.  Increasing shortness of breath prior to admission.  And increasing lower extremity edema.  She is status post cadaveric renal transplant and she is followed by nephrology.  Currently she is hypokalemic with creatinine in the 2.2 range.  Due to her increasing work of breathing impending respiratory failure she will be transferred to intensive care unit for further evaluation and treatment.  She has already been started on aggressive diuresis and steroids and empirical antimicrobial therapy.  Pertinent  Medical History   Past Medical History:  Diagnosis Date   Allergy    Anemia associated with chronic renal failure    Anxiety    Asthma    Atypical chest pain    long-standing -- normal cardio cath 06-27-2012 and normal nuclear stress test 06-25-2016   AV (arteriovenous fistula) (HCC)    for dialysis-- currently located left radiocephalic   CAD (coronary artery disease) cardiologist-- dr Sharol Roussel [facility], [facility] in [place])   a. False positive stress echo 06/2012 at [facility] - cath with no obstructive CAD at [facility] (mild luminal irregularities in LAD, moderate diffuse disease in distal RCA).   Depression    Elevated lipids    ESRD on hemodialysis [facility]) NEPHROLOGIST-  DR MATTINGLY   started dialysis 01/29/11:  Foye Spurling [facility] on MWF   Gastroparesis    GERD (gastroesophageal reflux disease)    History of pneumonia    HCAP 04/ 2017   Hyperlipidemia    Hyperthyroidism    Menorrhagia    Other secondary hypertension    associated to diabetes -- followed by cardiologist ( dr Sharol Roussel)     Peripheral neuropathy    PONV (postoperative nausea and vomiting)    Pre-transplant evaluation for kidney transplant    [facility]   Renal failure syndrome 03/05/2022   Secondary hyperparathyroidism of renal origin [facility])    Sleep apnea    Type 2 diabetes mellitus, with long-term current use of insulin (HCC)    dx 1985     Significant Hospital Events: Including procedures, antibiotic start and stop dates in addition to other pertinent events   07/09 Transferred to ICU  07/10: Intubated   Interim History / Subjective:  Overnight pO2 improving.  Patient evaluated bedside this morning.  Patient still intubated and sedated.  Not following any commands.  Objective   Blood pressure (!) 164/70, pulse 89, temperature 97.9 F (36.6 C), resp. rate (!) 31, height 5\' 6"  (1.676 m), weight 128.1 kg, SpO2 100%.      Vent Mode: PRVC FiO2 (%):  [50 %-70 %] 50 % Set Rate:  [30 bmp] 30 bmp Vt Set:  [400 mL-470 mL] 400 mL PEEP:  [14 cmH20] 14 cmH20 Plateau Pressure:  [25 cmH20-27 cmH20] 26 cmH20   Intake/Output Summary (Last 24 hours) at 10/15/2022 1020 Last data filed at 10/15/2022 0700 Gross per  24 hour  Intake 6560.64 ml  Output 3460 ml  Net 3100.64 ml   Filed Weights   10/12/22 1630 10/14/22 0500 10/15/22 0356  Weight: 118.7 kg 127.6 kg 128.1 kg    Examination: General: Intubated and sedated, critically ill HENT: Normocephalic, atraumatic Lungs: Bibasilar decreased lung sounds bilaterally. Cardiovascular: Regular rate and rhythm, no murmurs, rubs, gallops Abdomen: Soft,  nondistended, normal active bowel sounds Extremities: 2+ pitting edema Neuro:  Intubated and sedated  Output: Output of 3800 mL yesterday  Vitals: Temperature afebrile, pulse 85-97, respiration 5-7 on ventilator settings of PRVC, FiO2 50%, PEEP 14,  Labs: Labs reviewed: Glucose 150-228 ABG: pH 7.30, pCO2 48.5, pO2 151 CBC: White count 13.9, hemoglobin 8.6, platelet 454 BMP: Sodium 130, bicarb 19, anion gap 18, BUN 78  Micro: Few normal respiratory flora.  No Staph aureus or Pseudomonas. 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  This did improve her saturations.  Patient is now down to FiO2 of 50%.  PEEP of 14.  Most recent PF ratio greater than 300.  Patient out of ARDS at this time.  Will give another dose of Lasix today and then schedule 40 Lasix twice daily.  Was able to void proning.  BAL with no organisms so far.  Respiratory cultures with no signs of Staph aureus or Pseudomonas.  -Continue to follow cultures -Bactrim day 3 -Zosyn day 5 -Continue prednisone 40 mg twice daily -Continue bronchodilators -Vap bundle -Serial ABGs  #History of renal transplant secondary to acute renal failure #Electrolyte derangements Patient is on insulin infusion could be contributing to electrolyte changes. Patient is also on bactrim which is helping with hypokalemia. Patient has had great urine output. Creatinine still elevated at 2.23.  Will continue to monitor. -Replete electrolytes as needed -Nephrology following, appreciate recommendations  #Type 1 diabetes Patient has a history of type 1 diabetes.  Patient does have history of insulin pump, but not using an on Endo tool currently. -Continue Endo tool -Diabetic coordinator following  #Hypertension Patient has a past medical history of hypertension.  Patient has had elevated blood  pressures into the 160s.  Did resume home amlodipine 10 mg daily.  Patient also has labetalol pushes and hydralazine pushes. -Continue to monitor blood pressure closely. -Continue amlodipine 10 mg daily  #Anemia of chronic disease Hemoglobin stable at this moment.  No acute concerns for bleed. -Hemoglobin 8.6  Best Practice (right click and "Reselect all SmartList Selections" daily)   Diet/type: Regular consistency (see orders) DVT prophylaxis: prophylactic heparin  GI prophylaxis: PPI Lines: N/A Foley:  N/A Code Status:  full code Last date of multidisciplinary goals of care discussion [tbd]  Labs   CBC: Recent Labs  Lab 10/09/22 2009 10/10/22 0216 10/11/22 0857 10/12/22 0318 10/13/22 0123 10/13/22 0436 10/14/22 0221 10/14/22 0328 10/14/22 1002 10/14/22 1214 10/14/22 1431 10/15/22 0243 10/15/22 0550  WBC 16.9*   < > 26.9* 25.3* 33.0*  --  25.2*  --   --   --   --  13.9*  --   NEUTROABS 13.5*  --  23.9* 22.5*  --   --   --   --   --   --   --  12.3*  --   HGB 9.9*   < > 10.3* 10.5* 10.1*   < >  9.3*   < > 10.2* 10.2* 10.2* 8.6* 10.2*  HCT 29.8*   < > 31.9* 32.5* 30.9*   < > 29.2*   < > 30.0* 30.0* 30.0* 27.0* 30.0*  MCV 77.0*   < > 81.4 80.8 78.2*  --  81.1  --   --   --   --  77.8*  --   PLT 365   < > 411* 397 550*  --  483*  --   --   --   --  454*  --    < > = values in this interval not displayed.    Basic Metabolic Panel: Recent Labs  Lab 10/11/22 0857 10/12/22 0318 10/12/22 0714 10/13/22 0129 10/13/22 0436 10/13/22 1600 10/14/22 0014 10/14/22 0221 10/14/22 0328 10/14/22 1214 10/14/22 1422 10/14/22 1431 10/15/22 0243 10/15/22 0550  NA 133*   < > 134* 135   < >  --    < > 130*   < > 133* 131* 131* 130* 131*  K 4.4   < > 5.7* 4.4   < >  --    < > 4.6   < > 4.6 4.4 4.3 4.9 5.1  CL 99   < > 100 101  --   --   --  97*  --   --  96*  --  93*  --   CO2 18*   < > 18* 20*  --   --   --  20*  --   --  21*  --  19*  --   GLUCOSE 236*   < > 332* 148*  --   --    --  236*  --   --  202*  --  194*  --   BUN 72*   < > 88* 93*  --   --   --  84*  --   --  79*  --  78*  --   CREATININE 2.21*   < > 2.24* 2.14*  --   --   --  2.23*  --   --  2.20*  --  2.23*  --   CALCIUM 7.5*   < > 7.7* 7.7*  --   --   --  7.3*  --   --  7.6*  --  7.3*  --   MG 1.8  --   --   --   --  1.9  --  2.0  --   --  1.7  --  1.9  --   PHOS 4.3   < >  --  3.9  --  6.1*  --  6.0*  --   --  6.1*  --  6.1*  --    < > = values in this interval not displayed.   GFR: Estimated Creatinine Clearance: 43.2 mL/min (A) (by C-G formula based on SCr of 2.23 mg/dL (H)). Recent Labs  Lab 10/12/22 0318 10/13/22 0123 10/14/22 0221 10/14/22 1422 10/14/22 1707 10/15/22 0243  PROCALCITON  --  0.51  --   --   --   --   WBC 25.3* 33.0* 25.2*  --   --  13.9*  LATICACIDVEN  --   --   --  1.4 1.1  --     Liver Function Tests: Recent Labs  Lab 10/10/22 0216 10/11/22 0857 10/12/22 0318 10/13/22 0129  AST 13*  --   --   --   ALT 17  --   --   --  ALKPHOS 180*  --   --   --   BILITOT 0.6  --   --   --   PROT 6.8  --   --   --   ALBUMIN 2.6* 2.6* 2.6* 2.5*   No results for input(s): "LIPASE", "AMYLASE" in the last 168 hours. No results for input(s): "AMMONIA" in the last 168 hours.  ABG    Component Value Date/Time   PHART 7.300 (L) 10/15/2022 0550   PCO2ART 48.5 (H) 10/15/2022 0550   PO2ART 151 (H) 10/15/2022 0550   HCO3 24.0 10/15/2022 0550   TCO2 25 10/15/2022 0550   ACIDBASEDEF 3.0 (H) 10/15/2022 0550   O2SAT 99 10/15/2022 0550     Coagulation Profile: No results for input(s): "INR", "PROTIME" in the last 168 hours.  Cardiac Enzymes: No results for input(s): "CKTOTAL", "CKMB", "CKMBINDEX", "TROPONINI" in the last 168 hours.  HbA1C: Hgb A1c MFr Bld  Date/Time Value Ref Range Status  08/27/2022 03:43 AM 6.7 (H) 4.8 - 5.6 % Final    Comment:    (NOTE)         Prediabetes: 5.7 - 6.4         Diabetes: >6.4         Glycemic control for adults with diabetes: <7.0    11/21/2017 06:49 PM 9.9 (H) 4.8 - 5.6 % Final    Comment:    (NOTE) Pre diabetes:          5.7%-6.4% Diabetes:              >6.4% Glycemic control for   <7.0% adults with diabetes     CBG: Recent Labs  Lab 10/15/22 0342 10/15/22 0448 10/15/22 0545 10/15/22 0735 10/15/22 0828  GLUCAP 176* 156* 150* 228* 193*    Review of Systems:   10 point review of system taken, please see HPI for positives and negatives.   Past Medical History:  She,  has a past medical history of Allergy, Anemia associated with chronic renal failure, Anxiety, Asthma, Atypical chest pain, AV (arteriovenous fistula) (HCC), CAD (coronary artery disease) (cardiologist-- dr Sharol Roussel [facility] [place] in [place])), Depression, Elevated lipids, ESRD on hemodialysis (HCC) (NEPHROLOGIST-  DR MATTINGLY), Gastroparesis, GERD (gastroesophageal reflux disease), History of pneumonia, Hyperlipidemia, Hyperthyroidism, Menorrhagia, Other secondary hypertension, Peripheral neuropathy, PONV (postoperative nausea and vomiting), Pre-transplant evaluation for kidney transplant, Renal failure syndrome (03/05/2022), Secondary hyperparathyroidism of renal origin [facility]), Sleep apnea, and Type 2 diabetes mellitus, with long-term current use of insulin (HCC).   Surgical History:   Past Surgical History:  Procedure Laterality Date   AV FISTULA PLACEMENT  08/2010   Left radiocephalic AVF   AV FISTULA PLACEMENT Right 05/07/2014   Procedure: ARTERIOVENOUS (AV) FISTULA CREATION;  Surgeon: Sherren Kerns, MD;  Location: [facility] OR;  Service: Vascular;  Laterality: Right;   AV FISTULA PLACEMENT Right 07/02/2014   Procedure: INSERTION OF ARTERIOVENOUS (AV) GORE-TEX GRAFT ARM;  Surgeon: Sherren Kerns, MD;  Location[facility] OR;  Service: Vascular;  Laterality: Right;   CARDIOVASCULAR STRESS TEST  06/25/2016   [facility]   normal nuclear perfusion study w/ no ischemia/  normal LV function and wall motion , ef 51%   CESAREAN SECTION   03/22/2001   w/ Bilateral Tubal Ligation   COLONOSCOPY     COLONOSCOPY WITH ESOPHAGOGASTRODUODENOSCOPY (EGD)  10/19/2011   DILATION AND CURETTAGE OF UTERUS  05/12/2000   w/ suction for missed ab   DOBUTAMINE STRESS ECHO  06/04/2016    [facility]   normal  stress echo w/ no chest pain or ischemia/  normal LV function and wall motion , stress ef 60-65%   ESOPHAGOGASTRODUODENOSCOPY  10/19/2011   Dr. Stan Head   FEMUR IM NAIL Left child   removed   FOOT SURGERY Left 2014 approx.   HYSTEROSCOPY WITH NOVASURE N/A 08/19/2016   Procedure: HYSTEROSCOPY WITH NOVASURE;  Surgeon: Huel Cote, MD;  Location: [facility];  Service: Gynecology;  Laterality: N/A;   KIDNEY TRANSPLANT  09/18/2016   LAPAROSCOPIC CHOLECYSTECTOMY  1994   LEFT HEART CATHETERIZATION WITH CORONARY ANGIOGRAM N/A 06/27/2012   Procedure: LEFT HEART CATHETERIZATION WITH CORONARY ANGIOGRAM;  Surgeon: Laurey Morale, MD;  Location: [facility] CATH LAB;  Service: Cardiovascular;  Laterality: N/A; No ostructive CAD, normal LVF, ef 55-60% (mild LAD luminal irregularities, moderate dRCA diffuse disease)   LIGATION GORETEX FISTULA  01/04/11   Left AVF   LIGATION OF ARTERIOVENOUS  FISTULA Left 08/21/2014   Procedure: LIGATION OF ARTERIOVENOUS  FISTULA;  Surgeon: Annice Needy, MD;  Location: [facility] ORS;  Service: Vascular;  Laterality: Left;   LIGATION OF ARTERIOVENOUS  FISTULA Left 06/29/2017   Procedure: LIGATION OF ARTERIOVENOUS  FISTULA ( RADIOCEPHALIC );  Surgeon: Annice Needy, MD;  Location: [facility] ORS;  Service: Vascular;  Laterality: Left;   NEPHRECTOMY TRANSPLANTED ORGAN     PERIPHERAL VASCULAR CATHETERIZATION Left 08/08/2014   Procedure: Upper Extremity Angiography;  Surgeon: Annice Needy, MD;  Location: [facility] INVASIVE CV LAB;  Service: Cardiovascular;  Laterality: Left;   PERIPHERAL VASCULAR CATHETERIZATION Left 08/08/2014   Procedure: Upper Extremity Intervention;  Surgeon: Annice Needy, MD;  Location: [facility] INVASIVE CV LAB;  Service:  Cardiovascular;  Laterality: Left;   PERIPHERAL VASCULAR CATHETERIZATION Left 03/08/2016   Procedure: A/V Fistulagram;  Surgeon: Annice Needy, MD;  Location: [facility] INVASIVE CV LAB;  Service: Cardiovascular;  Laterality: Left;   POLYPECTOMY     THROMBECTOMY AND REVISION OF ARTERIOVENTOUS (AV) GORETEX  GRAFT Right 07/16/2014   Procedure: THROMBECTOMY  Right  arm  ARTERIOVENOUS  GORETEX  GRAFT;  Surgeon: Sherren Kerns, MD;  Location: [facility] OR;  Service: Vascular;  Laterality: Right;   TRANSTHORACIC ECHOCARDIOGRAM  06/04/2016   mild concentric LVH, ef 55-60%,  mild TR,  borderline LAE,  trivial MR and PR     Social History:   reports that she has never smoked. She has never used smokeless tobacco. She reports that she does not drink alcohol and does not use drugs.   Family History:  Her family history includes Arthritis in her brother; Diabetes in her father, maternal aunt, and paternal aunt; Heart disease in her brother, mother, and paternal aunt; Hypertension in her father, maternal aunt, and paternal aunt; Kidney disease in her maternal grandmother and mother; Kidney failure in her paternal aunt and paternal uncle. There is no history of Colon cancer, Colon polyps, Crohn's disease, Esophageal cancer, Rectal cancer, Stomach cancer, or Ulcerative colitis.   Allergies Allergies  Allergen Reactions   Ciprofloxacin Hives, Itching and Nausea And Vomiting   Fluocinolone Itching, Nausea And Vomiting and Other (See Comments)   Hydromorphone Hives   Strawberry Extract Itching and Other (See Comments)    Pt states that this medication makes her tongue raw.   No reaction   Hydromorphone Hcl Hives   Phenytoin Sodium Extended Itching   Adhesive [Tape] Itching and Rash   Chlorhexidine Gluconate Itching   Clindamycin Diarrhea, Nausea And Vomiting and Rash   Shrimp [Shellfish Allergy] Rash     Home  Medications  Prior to Admission medications   Medication Sig Start Date End Date Taking? Authorizing  Provider  albuterol (PROVENTIL HFA;VENTOLIN HFA) 108 (90 Base) MCG/ACT inhaler Inhale 2 puffs into the lungs every 6 (six) hours as needed for wheezing or shortness of breath. 11/25/17  Yes Nwoko, Agnes I, NP  amLODipine (NORVASC) 10 MG tablet Take 1 tablet by mouth daily. 02/11/22  Yes [provider]  ARIPiprazole (ABILIFY) 30 MG tablet Take 30 mg by mouth daily. 01/26/22  Yes [provider]  atorvastatin (LIPITOR) 10 MG tablet Take 10 mg by mouth daily. 02/08/22  Yes [provider]  buPROPion (WELLBUTRIN XL) 150 MG 24 hr tablet Take 1 tablet (150 mg total) by mouth daily after breakfast. For depression Patient taking differently: Take 150 mg by mouth daily after breakfast. Take with 300mg  for a total daily dose of 450mg  for depression 11/26/17  Yes Nwoko, Agnes I, NP  buPROPion (WELLBUTRIN XL) 300 MG 24 hr tablet Take 300 mg by mouth daily.   Yes [provider]  cetirizine (ZYRTEC) 10 MG tablet Take 10 mg by mouth 2 (two) times daily.   Yes [provider]  cinacalcet (SENSIPAR) 30 MG tablet Take 30 mg by mouth daily. 03/10/22  Yes [provider]  citalopram (CELEXA) 40 MG tablet Take 40 mg by mouth daily.   Yes [provider]  cloNIDine (CATAPRES) 0.3 MG tablet Take 0.3 mg by mouth 3 (three) times daily.    Yes [provider]  Dulaglutide 4.5 MG/0.5ML SOPN Inject 4.5 mg into the skin once a week. Every Tuesday 12/09/21  Yes [provider]  gabapentin (NEURONTIN) 300 MG capsule Take 300 mg by mouth at bedtime as needed. pain   Yes [provider]  hydrOXYzine (ATARAX) 25 MG tablet Take 25 mg by mouth at bedtime.   Yes [provider]  insulin aspart (NOVOLOG) 100 UNIT/ML injection USE UP TO 100 UNITS ONCE DAILY IN INSULIN PUMP. DX CODE E11.42 03/10/22  Yes [provider]  Insulin Human (INSULIN PUMP) SOLN Inject 1 each into the skin See admin instructions.   Yes [provider]   labetalol (NORMODYNE) 200 MG tablet Take 2 tablets (400 mg total) by mouth 3 (three) times daily. For high blood pressure 11/25/17  Yes Nwoko, Agnes I, NP  montelukast (SINGULAIR) 10 MG tablet Take 10 mg by mouth at bedtime.   Yes [provider]  mycophenolate (MYFORTIC) 180 MG EC tablet Take 720 mg by mouth 2 (two) times daily.   Yes [provider]  Oxycodone HCl 10 MG TABS Take 10 mg by mouth See admin instructions. Take 1 tablet by mouth by mouth every 4 to 6 hours as needed for sciatic nerve and back pain   Yes [provider]  pantoprazole (PROTONIX) 40 MG tablet Take 1 tablet (40 mg total) by mouth daily. For acid reflux 11/26/17  Yes Nwoko, Nicole Kindred I, NP  prazosin (MINIPRESS) 1 MG capsule Take 1 mg by mouth at bedtime.   Yes [provider]  predniSONE (DELTASONE) 5 MG tablet Take 5 mg by mouth daily with breakfast.   Yes [provider]  simvastatin (ZOCOR) 10 MG tablet Take 1 tablet (10 mg total) by mouth at bedtime. For high cholesterol Patient taking differently: Take 10 mg by mouth daily. For high cholesterol 11/25/17  Yes Nwoko, Nicole Kindred I, NP  sodium bicarbonate 650 MG tablet Take 1,300 mg by mouth 2 (two) times daily.   Yes  [provider]  sulfamethoxazole-trimethoprim (BACTRIM,SEPTRA) 400-80 MG tablet Take 1 tablet by mouth 3 (three) times a week. For infection Patient taking differently: Take 1 tablet by mouth every Monday, Wednesday, and Friday. For infection 11/28/17  Yes Nwoko, Nicole Kindred I, NP  tacrolimus (PROGRAF) 1 MG capsule Take 4-5 mg by mouth See admin instructions. Take 5 capsules by mouth in the morning and 4 capsules at night   Yes [provider]  zolpidem (AMBIEN) 5 MG tablet Take 1 tablet (5 mg total) by mouth at bedtime as needed for sleep. Patient taking differently: Take 5 mg by mouth at bedtime. 11/25/17  Yes Armandina Stammer I, NP  naloxone [facility]) nasal spray 4 mg/0.1 mL  10/28/21   [provider]      Critical care time: 34 min    Modena Slater DO Internal Medicine Resident PGY-2 [phone] 10/15/2022, 10:20 AM

## 2022-10-15 NOTE — Progress Notes (Signed)
OT Cancellation Note  Patient Details Name: Linda Ellison MRN: [id number] DOB: [birth year]   Cancelled Treatment:    Reason Eval/Treat Not Completed: Patient not medically ready. OT to sign off. Please re-consult if change in status/pt becomes medically appropriate.   Tyler Deis, OTR/L [facility] Acute Rehabilitation Office: [phone]  Linda Ellison 10/15/2022, 10:38 AM

## 2022-10-16 ENCOUNTER — Other Ambulatory Visit: Payer: Self-pay | Admitting: Pulmonary Disease

## 2022-10-16 ENCOUNTER — Inpatient Hospital Stay (HOSPITAL_COMMUNITY): Payer: Medicare (Managed Care)

## 2022-10-16 ENCOUNTER — Other Ambulatory Visit: Payer: Self-pay

## 2022-10-16 DIAGNOSIS — J189 Pneumonia, unspecified organism: Secondary | ICD-10-CM | POA: Diagnosis not present

## 2022-10-16 LAB — BASIC METABOLIC PANEL
Anion gap: 12 (ref 5–15)
BUN: 77 mg/dL — ABNORMAL HIGH (ref 6–20)
CO2: 21 mmol/L — ABNORMAL LOW (ref 22–32)
Calcium: 7.2 mg/dL — ABNORMAL LOW (ref 8.9–10.3)
Chloride: 93 mmol/L — ABNORMAL LOW (ref 98–111)
Creatinine, Ser: 2.14 mg/dL — ABNORMAL HIGH (ref 0.44–1.00)
GFR, Estimated: 28 mL/min — ABNORMAL LOW (ref >60)
Glucose, Bld: 227 mg/dL — ABNORMAL HIGH (ref 70–99)
Potassium: 5.3 mmol/L — ABNORMAL HIGH (ref 3.5–5.1)
Sodium: 126 mmol/L — ABNORMAL LOW (ref 135–145)

## 2022-10-16 LAB — POCT I-STAT 7, (LYTES, BLD GAS, ICA,H+H)
Acid-base deficit: 2 mmol/L (ref 0.0–2.0)
Acid-base deficit: 3 mmol/L — ABNORMAL HIGH (ref 0.0–2.0)
Bicarbonate: 23.7 mmol/L (ref 20.0–28.0)
Bicarbonate: 23.6 mmol/L (ref 20.0–28.0)
Calcium, Ion: 1.02 mmol/L — ABNORMAL LOW (ref 1.15–1.40)
Calcium, Ion: 1.02 mmol/L — ABNORMAL LOW (ref 1.15–1.40)
HCT: 27 % — ABNORMAL LOW (ref 36.0–46.0)
HCT: 29 % — ABNORMAL LOW (ref 36.0–46.0)
Hemoglobin: 9.2 g/dL — ABNORMAL LOW (ref 12.0–15.0)
Hemoglobin: 9.9 g/dL — ABNORMAL LOW (ref 12.0–15.0)
O2 Saturation: 98 %
O2 Saturation: 94 %
Patient temperature: 37.8
Potassium: 5.4 mmol/L — ABNORMAL HIGH (ref 3.5–5.1)
Potassium: 5.4 mmol/L — ABNORMAL HIGH (ref 3.5–5.1)
Sodium: 128 mmol/L — ABNORMAL LOW (ref 135–145)
Sodium: 128 mmol/L — ABNORMAL LOW (ref 135–145)
TCO2: 25 mmol/L (ref 22–32)
TCO2: 25 mmol/L (ref 22–32)
pCO2 arterial: 47.2 mmHg (ref 32–48)
pCO2 arterial: 47.3 mmHg (ref 32–48)
pH, Arterial: 7.312 — ABNORMAL LOW (ref 7.35–7.45)
pH, Arterial: 7.306 — ABNORMAL LOW (ref 7.35–7.45)
pO2, Arterial: 110 mmHg — ABNORMAL HIGH (ref 83–108)
pO2, Arterial: 79 mmHg — ABNORMAL LOW (ref 83–108)

## 2022-10-16 LAB — GLUCOSE, CAPILLARY
Glucose-Capillary: 132 mg/dL — ABNORMAL HIGH (ref 70–99)
Glucose-Capillary: 134 mg/dL — ABNORMAL HIGH (ref 70–99)
Glucose-Capillary: 146 mg/dL — ABNORMAL HIGH (ref 70–99)
Glucose-Capillary: 149 mg/dL — ABNORMAL HIGH (ref 70–99)
Glucose-Capillary: 150 mg/dL — ABNORMAL HIGH (ref 70–99)
Glucose-Capillary: 154 mg/dL — ABNORMAL HIGH (ref 70–99)
Glucose-Capillary: 160 mg/dL — ABNORMAL HIGH (ref 70–99)
Glucose-Capillary: 163 mg/dL — ABNORMAL HIGH (ref 70–99)
Glucose-Capillary: 167 mg/dL — ABNORMAL HIGH (ref 70–99)
Glucose-Capillary: 173 mg/dL — ABNORMAL HIGH (ref 70–99)
Glucose-Capillary: 175 mg/dL — ABNORMAL HIGH (ref 70–99)
Glucose-Capillary: 185 mg/dL — ABNORMAL HIGH (ref 70–99)
Glucose-Capillary: 187 mg/dL — ABNORMAL HIGH (ref 70–99)
Glucose-Capillary: 188 mg/dL — ABNORMAL HIGH (ref 70–99)
Glucose-Capillary: 188 mg/dL — ABNORMAL HIGH (ref 70–99)
Glucose-Capillary: 196 mg/dL — ABNORMAL HIGH (ref 70–99)
Glucose-Capillary: 205 mg/dL — ABNORMAL HIGH (ref 70–99)
Glucose-Capillary: 229 mg/dL — ABNORMAL HIGH (ref 70–99)
Glucose-Capillary: 231 mg/dL — ABNORMAL HIGH (ref 70–99)
Glucose-Capillary: 234 mg/dL — ABNORMAL HIGH (ref 70–99)

## 2022-10-16 LAB — CBC
HCT: 25.5 % — ABNORMAL LOW (ref 36.0–46.0)
Hemoglobin: 8.4 g/dL — ABNORMAL LOW (ref 12.0–15.0)
MCH: 25.9 pg — ABNORMAL LOW (ref 26.0–34.0)
MCHC: 32.9 g/dL (ref 30.0–36.0)
MCV: 78.7 fL — ABNORMAL LOW (ref 80.0–100.0)
Platelets: 422 10*3/uL — ABNORMAL HIGH (ref 150–400)
RBC: 3.24 MIL/uL — ABNORMAL LOW (ref 3.87–5.11)
RDW: 14.4 % (ref 11.5–15.5)
WBC: 10.4 10*3/uL (ref 4.0–10.5)
nRBC: 1.5 % — ABNORMAL HIGH (ref 0.0–0.2)

## 2022-10-16 LAB — CULTURE, BAL-QUANTITATIVE W GRAM STAIN: Culture: NO GROWTH

## 2022-10-16 LAB — TACROLIMUS LEVEL: Tacrolimus (FK506) - LabCorp: 8.4 ng/mL (ref 2.0–20.0)

## 2022-10-16 MED ORDER — STERILE WATER FOR INJECTION IJ SOLN
INTRAMUSCULAR | Status: AC
  Filled 2022-10-16: qty 10

## 2022-10-16 MED ORDER — METHYLPREDNISOLONE SODIUM SUCC 125 MG IJ SOLR
125.0000 mg | Freq: Once | INTRAMUSCULAR | Status: AC
  Administered 2022-10-16: 125 mg via INTRAVENOUS
  Filled 2022-10-16: qty 2

## 2022-10-16 MED ORDER — HYDROMORPHONE HCL 1 MG/ML IJ SOLN
INTRAMUSCULAR | Status: AC
  Filled 2022-10-16: qty 1

## 2022-10-16 MED ORDER — SULFAMETHOXAZOLE-TRIMETHOPRIM 400-80 MG/5ML IV SOLN
400.0000 mg | Freq: Four times a day (QID) | INTRAVENOUS | Status: DC
  Administered 2022-10-16 – 2022-10-17 (×4): 400 mg via INTRAVENOUS
  Filled 2022-10-16 (×6): qty 25

## 2022-10-16 MED ORDER — CLEVIDIPINE BUTYRATE 0.5 MG/ML IV EMUL
0.0000 mg/h | INTRAVENOUS | Status: DC
  Administered 2022-10-16: 2 mg/h via INTRAVENOUS
  Administered 2022-10-16: 4 mg/h via INTRAVENOUS
  Filled 2022-10-16 (×4): qty 50

## 2022-10-16 MED ORDER — FENTANYL CITRATE PF 50 MCG/ML IJ SOSY: 25.0000 ug | PREFILLED_SYRINGE | Freq: Once | INTRAMUSCULAR | Status: DC

## 2022-10-16 MED ORDER — BISACODYL 10 MG RE SUPP
10.0000 mg | Freq: Once | RECTAL | Status: AC
  Administered 2022-10-16: 10 mg via RECTAL
  Filled 2022-10-16: qty 1

## 2022-10-16 MED ORDER — SODIUM ZIRCONIUM CYCLOSILICATE 5 G PO PACK
5.0000 g | PACK | Freq: Once | ORAL | Status: AC
  Administered 2022-10-16: 5 g
  Filled 2022-10-16: qty 1

## 2022-10-16 MED ORDER — ARTIFICIAL TEARS OPHTHALMIC OINT
1.0000 | TOPICAL_OINTMENT | Freq: Three times a day (TID) | OPHTHALMIC | Status: DC
  Administered 2022-10-16 – 2022-10-20 (×12): 1 via OPHTHALMIC
  Filled 2022-10-16: qty 3.5

## 2022-10-16 MED ORDER — IPRATROPIUM-ALBUTEROL 0.5-2.5 (3) MG/3ML IN SOLN
3.0000 mL | Freq: Once | RESPIRATORY_TRACT | Status: AC
  Administered 2022-10-16: 3 mL via RESPIRATORY_TRACT

## 2022-10-16 MED ORDER — HYDROMORPHONE HCL 1 MG/ML IJ SOLN
1.0000 mg | Freq: Once | INTRAMUSCULAR | Status: AC | PRN
  Administered 2022-10-16: 1 mg via INTRAVENOUS

## 2022-10-16 MED ORDER — LACTULOSE 10 GM/15ML PO SOLN
20.0000 g | Freq: Two times a day (BID) | ORAL | Status: DC
  Administered 2022-10-16 – 2022-10-17 (×4): 20 g
  Filled 2022-10-16 (×5): qty 30

## 2022-10-16 MED ORDER — MIDAZOLAM BOLUS VIA INFUSION
1.0000 mg | INTRAVENOUS | Status: DC | PRN
  Administered 2022-10-16 – 2022-10-18 (×5): 2 mg via INTRAVENOUS

## 2022-10-16 MED ORDER — VECURONIUM BROMIDE 10 MG IV SOLR
10.0000 mg | INTRAVENOUS | Status: DC | PRN
  Administered 2022-10-16 – 2022-10-17 (×2): 10 mg via INTRAVENOUS
  Filled 2022-10-16 (×2): qty 10

## 2022-10-16 MED ORDER — FENTANYL 2500MCG IN NS 250ML (10MCG/ML) PREMIX INFUSION: 25.0000 ug/h | INTRAVENOUS | Status: DC

## 2022-10-16 NOTE — Progress Notes (Signed)
[facility] Progress Note   Subjective:  Remains intubated.  Pulm status is improving.  I/Os yest 6.7 / 3.7 - getting lasix BID.    Objective Vitals:   10/16/22 0930 10/16/22 0945 10/16/22 0957 10/16/22 1000  BP:   (!) 174/70   Pulse: 99 97  100  Resp: (!) 30 (!) 25  14  Temp: 99.3 F (37.4 C) 99.3 F (37.4 C)  99.3 F (37.4 C)  TempSrc:      SpO2: 99% 98%  99%  Weight:      Height:       Physical Exam General appearance: intubated and sedated Resp: on vent, rhonchi throughout Cardio: regular rate and rhythm, S1, S2 normal, no murmur, click, rub or gallop GI: soft, non-tender; bowel sounds normal; no masses,  no organomegaly and renal allograft in RLQ, no tenderness or bruits. Extremities: 1+ diffuse edema  Additional Objective Labs: Basic Metabolic Panel: Recent Labs  Lab 10/14/22 0221 10/14/22 0328 10/14/22 1422 10/14/22 1431 10/15/22 0243 10/15/22 0550 10/15/22 1501 10/16/22 0134 10/16/22 0355  NA 130*   < > 131*   < > 130*   < > 129* 126* 128*  K 4.6   < > 4.4   < > 4.9   < > 5.2* 5.3* 5.4*  CL 97*  --  96*  --  93*  --   --  93*  --   CO2 20*  --  21*  --  19*  --   --  21*  --   GLUCOSE 236*  --  202*  --  194*  --   --  227*  --   BUN 84*  --  79*  --  78*  --   --  77*  --   CREATININE 2.23*  --  2.20*  --  2.23*  --   --  2.14*  --   CALCIUM 7.3*  --  7.6*  --  7.3*  --   --  7.2*  --   PHOS 6.0*  --  6.1*  --  6.1*  --   --   --   --    < > = values in this interval not displayed.   Liver Function Tests: Recent Labs  Lab 10/10/22 0216 10/11/22 0857 10/12/22 0318 10/13/22 0129  AST 13*  --   --   --   ALT 17  --   --   --   ALKPHOS 180*  --   --   --   BILITOT 0.6  --   --   --   PROT 6.8  --   --   --   ALBUMIN 2.6* 2.6* 2.6* 2.5*   No results for input(s): "LIPASE", "AMYLASE" in the last 168 hours. CBC: Recent Labs  Lab 10/11/22 0857 10/12/22 0318 10/13/22 0123 10/13/22 0436 10/14/22 0221 10/14/22 0328  10/15/22 0243 10/15/22 0550 10/15/22 1501 10/16/22 0134 10/16/22 0355  WBC 26.9* 25.3* 33.0*  --  25.2*  --  13.9*  --   --  10.4  --   NEUTROABS 23.9* 22.5*  --   --   --   --  12.3*  --   --   --   --   HGB 10.3* 10.5* 10.1*   < > 9.3*   < > 8.6*   < > 10.5* 8.4* 9.2*  HCT 31.9* 32.5* 30.9*   < > 29.2*   < > 27.0*   < > 31.0*  25.5* 27.0*  MCV 81.4 80.8 78.2*  --  81.1  --  77.8*  --   --  78.7*  --   PLT 411* 397 550*  --  483*  --  454*  --   --  422*  --    < > = values in this interval not displayed.   Blood Culture    Component Value Date/Time   SDES BRONCHIAL ALVEOLAR LAVAGE 10/13/2022 0905   SPECREQUEST NONE 10/13/2022 0905   CULT  10/13/2022 0905    NO GROWTH 2 DAYS Performed at [facility], [address], [place], [place] [address]    REPTSTATUS PENDING 10/13/2022 6045    Cardiac Enzymes: No results for input(s): "CKTOTAL", "CKMB", "CKMBINDEX", "TROPONINI" in the last 168 hours. CBG: Recent Labs  Lab 10/16/22 0522 10/16/22 0621 10/16/22 0737 10/16/22 0837 10/16/22 0953  GLUCAP 150* 229* 196* 173* 146*   Iron Studies:  No results for input(s): "IRON", "TIBC", "TRANSFERRIN", "FERRITIN" in the last 72 hours.  @lablastinr3 @ Studies/Results: DG CHEST PORT 1 VIEW  Result Date: 10/14/2022 CLINICAL DATA:  47 year old female central line placement. EXAM: PORTABLE CHEST 1 VIEW COMPARISON:  Portable chest 0808 hours today. FINDINGS: Portable AP supine view at 1143 hours. Left IJ approach central line has been placed, tip is at the cavoatrial junction level. Endotracheal tube tip remains in good position between the level the clavicles and carina. Enteric tube courses to the abdomen and the tip is not included. Stable lung volumes. Mediastinal contours remain normal. Coarse and confluent bilateral pulmonary opacity has not significantly changed. No pneumothorax or pleural effusion identified. Stable visualized osseous structures. IMPRESSION: 1. Left IJ approach  central line tip at the cavoatrial junction, no adverse features. 2.  Otherwise stable lines and tubes. 3. Stable pulmonary ventilation, bilateral lung opacity since 0808 hours. Electronically Signed   By: Odessa Fleming M.D.   On: 10/14/2022 12:29   Medications:  sodium chloride 10 mL/hr at 10/16/22 0930   feeding supplement (VITAL AF 1.2 CAL) 40 mL/hr at 10/16/22 0930   fentaNYL infusion INTRAVENOUS 400 mcg/hr (10/16/22 0930)   insulin 6.5 Units/hr (10/16/22 0955)   midazolam 10 mg/hr (10/16/22 1015)   piperacillin-tazobactam 12.5 mL/hr at 10/16/22 0930    amLODipine  10 mg Per Tube Daily   arformoterol  15 mcg Nebulization BID   ARIPiprazole  30 mg Per Tube Daily   atorvastatin  10 mg Per Tube Daily   bisacodyl  10 mg Rectal Once   buPROPion  225 mg Per Tube BID   docusate  100 mg Per Tube BID   famotidine  20 mg Per Tube Daily   folic acid  1 mg Per Tube Daily   furosemide  40 mg Intravenous BID   guaiFENesin  400 mg Per Tube Q4H   heparin  5,000 Units Subcutaneous Q8H   hydrOXYzine  25 mg Per Tube QHS   lactulose  20 g Per Tube BID   loratadine  10 mg Per Tube Daily   methylPREDNISolone (SOLU-MEDROL) injection  65 mg Intravenous Q12H   montelukast  10 mg Per Tube QHS   multivitamin with minerals  1 tablet Per Tube Daily   mouth rinse  15 mL Mouth Rinse Q2H   polyethylene glycol  17 g Per Tube BID   revefenacin  175 mcg Nebulization Daily   senna-docusate  1 tablet Per Tube BID   sodium bicarbonate  1,300 mg Per Tube BID   sulfamethoxazole-trimethoprim  550 mg  of trimethoprim Intravenous Q6H    Assessment/Plan:  AKI/CKD stage IIIa following DDKT- Baseline in the 1.4-1.6 range it appears. Admission Cr 5 presumably due to ischemic ATN in setting of poor po intake and diarrhea for the past 5-6 days.  Scr has improved to low 2s and stable.  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  Continue daily labs for now.  OK with PRN diuretics for volume - getting 40 IV BID at this time.    Avoid nephrotoxic medications including NSAIDs and iodinated intravenous contrast exposure unless the latter is absolutely indicated.  Preferred narcotic agents for pain control are hydromorphone, fentanyl, and methadone. Morphine should not be used. Avoid Baclofen and avoid oral sodium phosphate and magnesium citrate based laxatives / bowel preps. Continue strict Input and Output monitoring. Will monitor the patient closely with you and intervene or adjust therapy as indicated by changes in clinical status/labs  Acute hypoxic respiratory failure with acute asthma exacerbation  + pneumonia - broad spectrum antibiotics per primary svc.  On prednisone as well.   Non contrasted CT chest with extensive BL multifocal opacities. Required intubation 7/10; s/p bronch with studies unrevealing to date.  On PJP and bacterial PNA tx.  As above lasix today.  Immunosuppressive therapy - normally on myfortic 720 mg bid, prograf 5 mg qam and 4 mg qpm, and prednisone 5 mg.  Holding myfortic in setting of severe illness.  Tac level pending 7/7 at 21:50 - level 84.5 - seemed to be timed correctly for a trough but clearly now.  Hold tac and resent level 7/11 (hadn't got dose yet) and pharm checked if [facility] can expediate but unfortunately they cannot. AGMA - due to #1 and diarrhea.  S/p LR and on po bicarb now - acceptable   CAD - not an acute issue. DM type 2 - per primary svc Anemia of CKD stage III - continue to follow and transfuse for Hgb <7  Will continue to follow, reach out with concerns.   Estill Bakes MD 10/16/2022, 10:33 AM  [facility] Pager: [phone]

## 2022-10-16 NOTE — Progress Notes (Signed)
Cleviprex ordered for uncontrolled hypertension

## 2022-10-16 NOTE — Progress Notes (Signed)
Pharmacy Antibiotic Note  Linda Ellison is a 47 y.o. female admitted on 10/09/2022 with bilateral multifocal pneumonia.  Pharmacy has been consulted for Zosyn dosing for aspiration PNA.  K trending up - 5.4. BMI >30 and will use ABW to adjust Bactrim dose to the 15-20 mg/kg/day trimethoprim goal for PJP PNA.   Plan: Adjust Bactrim to 400 mg IV q6 hours using ABW (~18 mg/kg/day trimethoprim).  Continue Zosyn 3.375gm IV q8h (4hr extended infusions) Follow up renal function, cultures, clinical course, duration of therapy  Height: 5\' 6"  (167.6 cm) Weight: 130.6 kg (287 lb 14.7 oz) IBW/kg (Calculated) : 59.3  Temp (24hrs), Avg:99 F (37.2 C), Min:97.2 F (36.2 C), Max:100 F (37.8 C)  Recent Labs  Lab 10/12/22 0318 10/12/22 0714 10/13/22 0123 10/13/22 0129 10/14/22 0221 10/14/22 1422 10/14/22 1707 10/15/22 0243 10/16/22 0134  WBC 25.3*  --  33.0*  --  25.2*  --   --  13.9* 10.4  CREATININE 2.20*   < >  --  2.14* 2.23* 2.20*  --  2.23* 2.14*  LATICACIDVEN  --   --   --   --   --  1.4 1.1  --   --    < > = values in this interval not displayed.    Estimated Creatinine Clearance: 45.5 mL/min (A) (by C-G formula based on SCr of 2.14 mg/dL (H)).    Allergies  Allergen Reactions   Ciprofloxacin Hives, Itching and Nausea And Vomiting   Fluocinolone Itching, Nausea And Vomiting and Other (See Comments)   Hydromorphone Hives   Strawberry Extract Itching and Other (See Comments)    Pt states that this medication makes her tongue raw.   No reaction   Hydromorphone Hcl Hives   Phenytoin Sodium Extended Itching   Adhesive [Tape] Itching and Rash   Chlorhexidine Gluconate Itching   Clindamycin Diarrhea, Nausea And Vomiting and Rash   Shrimp [Shellfish Allergy] Rash    Antimicrobials this admission: 7/7 Cefepime >> 7/9 7/7 Doxy >>7/10 7/9 Zosyn >> 7/10 Bactrim >>  Dose adjustments this admission:  Microbiology results: 7/6 BCx: negative 7/7 Sputum: normal flora 7/10 BAL  >> 7/10 PJP >> (send out)   Thank you for allowing pharmacy to be a part of this patient's care.  Link Snuffer, PharmD, BCPS, BCCCP Clinical Pharmacist 10/16/2022 10:36 AM  Please check AMION for all [facility] phone numbers After 10:00 PM, call Main Pharmacy [phone]

## 2022-10-16 NOTE — Progress Notes (Signed)
Significant ventilatory dyssynchrony  As needed vecuronium ordered

## 2022-10-16 NOTE — Progress Notes (Addendum)
eLink Physician-Brief Progress Note Patient Name: LAGRETTA COLLIGAN DOB: December 26, 1975 MRN: [id number]   Date of Service  10/16/2022  HPI/Events of Note  Notified of vent asynchrony despite versed at 10mg /hr and fentanyl at 472mcg/hr.  Pt is responding to pain so has not been given paralytic.  Pt has prolonged Qtc and EKG showed Qtc 490.    eICU Interventions  Hold off on propofol.  Increase versed gtt max to 12mg /hr.  Continue fentanyl.      Intervention Category Intermediate Interventions: Other:  Larinda Buttery 10/16/2022, 11:20 PM  4:52 AM Monitor was ringing out ST elevation.  EKG showing normal sinus rhythm, Qtc 459.  Non-specific ST-T segment changes.   Plan> Continue to monitor.

## 2022-10-16 NOTE — Progress Notes (Signed)
Dr. Wynona Neat notified of patient's persistent hypertension despite PRN antihypertensive administration.

## 2022-10-16 NOTE — Progress Notes (Signed)
NAME:  Linda Ellison, MRN:  [id number], DOB:  01-26-1976, LOS: 6 ADMISSION DATE:  10/09/2022, CONSULTATION DATE: 10/12/2022 REFERRING MD: Triad, CHIEF COMPLAINT: Respiratory distress  History of Present Illness:  47 year old female with extensive past medical history is well-documented below who was admitted 2 days prior to this consult with multifocal pneumonia.  She has had increasing work of breathing with a respiratory rate of 46.  She had a CT scan that demonstrates bilateral multifocal pneumonia, elevated BNP, and increased work of breathing that will require her being transferred to the intensive care unit.  She has noted a nonproductive cough prior to admission.  Increasing shortness of breath prior to admission.  And increasing lower extremity edema.  She is status post cadaveric renal transplant and she is followed by nephrology.  Currently she is hypokalemic with creatinine in the 2.2 range.  Due to her increasing work of breathing impending respiratory failure she will be transferred to intensive care unit for further evaluation and treatment.  She has already been started on aggressive diuresis and steroids and empirical antimicrobial therapy.  Pertinent  Medical History   Past Medical History:  Diagnosis Date   Allergy    Anemia associated with chronic renal failure    Anxiety    Asthma    Atypical chest pain    long-standing -- normal cardio cath 06-27-2012 and normal nuclear stress test 06-25-2016   AV (arteriovenous fistula) (HCC)    for dialysis-- currently located left radiocephalic   CAD (coronary artery disease) cardiologist-- dr Sharol Roussel [facility] in [place])   a. False positive stress echo 06/2012 at [facility] - cath with no obstructive CAD at [facility] (mild luminal irregularities in LAD, moderate diffuse disease in distal RCA).   Depression    Elevated lipids    ESRD on hemodialysis [facility]) NEPHROLOGIST-  DR MATTINGLY   started dialysis 01/29/11:  Foye Spurling [facility] on MWF   Gastroparesis    GERD (gastroesophageal reflux disease)    History of pneumonia    HCAP 04/ 2017   Hyperlipidemia    Hyperthyroidism    Menorrhagia    Other secondary hypertension    associated to diabetes -- followed by cardiologist ( dr Sharol Roussel)     Peripheral neuropathy    PONV (postoperative nausea and vomiting)    Pre-transplant evaluation for kidney transplant    [facility]   Renal failure syndrome 03/05/2022   Secondary hyperparathyroidism of renal origin [facility])    Sleep apnea    Type 2 diabetes mellitus, with long-term current use of insulin (HCC)    dx 1985     Significant Hospital Events: Including procedures, antibiotic start and stop dates in addition to other pertinent events   07/09 Transferred to ICU  07/10: Intubated  7/13-improving O2 requirement  Interim History / Subjective:  Improving O2 requirement No overnight events  Remains sedated, comfortable  Objective   Blood pressure (!) 164/67, pulse 98, temperature 99.3 F (37.4 C), resp. rate (!) 27, height 5\' 6"  (1.676 m), weight 130.6 kg, SpO2 99%.      Vent Mode: PRVC FiO2 (%):  [40 %-45 %] 40 % Set Rate:  [30 bmp] 30 bmp Vt Set:  [400 mL-470 mL] 470 mL PEEP:  [10 cmH20-12 cmH20] 10 cmH20 Plateau Pressure:  [24 cmH20-26 cmH20] 25 cmH20   Intake/Output Summary (Last 24 hours) at 10/16/2022 0805 Last data filed at 10/16/2022 0727 Gross per 24 hour  Intake 6941.37 ml  Output 3995 ml  Net 2946.37 ml   Filed Weights   10/14/22 0500 10/15/22 0356 10/16/22 0500  Weight: 127.6 kg 128.1 kg 130.6 kg    Examination: General: Intubated, sedated, chronically ill-appearing HENT: Moist oral mucosa, endotracheal tube in place Lungs: Bibasal rales, coarse breath sounds at the bases Cardiovascular: S1-S2 appreciated with no murmur  Abdomen: Abdomen is soft, bowel sounds appreciated Extremities: 2+ pitting edema Neuro: Intubated and  sedated  Improving fluid status on diuretics  On a PEEP of 10, PRVC, rate of 30, tidal volume 470  Labs: Labs reviewed: Sodium 128, potassium 5.4, BUN 77, creatinine 2.14  White count of 10.4-improving  micro: Few normal respiratory flora.  No Staph aureus or Pseudomonas. BAL: No organisms so far PJP PCR pending  Resolved Hospital Problem list     Assessment & Plan:   Acute hypoxemic respiratory failure secondary to multifocal pneumonia ARDS Concern for PJP pneumonia -Resolving Day 4 of Bactrim Day 6 of Zosyn On prednisone 40 mg twice daily Pneumocystis stain still pending results   Continue mechanical ventilation per ARDS protocol -Target TVol 6-8cc/kgIBW -Target Plateau Pressure < 30cm H20 -Target driving pressure less than 15 cm of water -Target PaO2 55-65: titrate PEEP/FiO2 per protocol -Ventilator associated pneumonia prevention protocol -Ventilator requirements improving  History of renal transplant secondary to acute renal failure S/p cadaveric renal transplantation on immunosuppressants Tacrolimus toxicity acute kidney injury on chronic kidney disease Stage III A -Trend electrolytes -Avoid nephrotoxic medications -Continue to monitor output  Type 1 diabetes -On insulin infusion -Appreciate diabetes coordinator input  Hypertension -Continue to monitor -Continue amlodipine  Anemia of chronic illness -Continue to trend   Best Practice (right click and "Reselect all SmartList Selections" daily)   Diet/type: tubefeeds DVT prophylaxis: prophylactic heparin  GI prophylaxis: PPI Lines: N/A Foley:  N/A Code Status:  full code Last date of multidisciplinary goals of care discussion [discussed with significant other at bedside]  Labs   CBC: Recent Labs  Lab 10/09/22 2009 10/10/22 0216 10/11/22 0857 10/12/22 0318 10/13/22 0123 10/13/22 0436 10/14/22 0221 10/14/22 0328 10/15/22 0243 10/15/22 0550 10/15/22 1501 10/16/22 0134 10/16/22 0355   WBC 16.9*   < > 26.9* 25.3* 33.0*  --  25.2*  --  13.9*  --   --  10.4  --   NEUTROABS 13.5*  --  23.9* 22.5*  --   --   --   --  12.3*  --   --   --   --   HGB 9.9*   < > 10.3* 10.5* 10.1*   < > 9.3*   < > 8.6* 10.2* 10.5* 8.4* 9.2*  HCT 29.8*   < > 31.9* 32.5* 30.9*   < > 29.2*   < > 27.0* 30.0* 31.0* 25.5* 27.0*  MCV 77.0*   < > 81.4 80.8 78.2*  --  81.1  --  77.8*  --   --  78.7*  --   PLT 365   < > 411* 397 550*  --  483*  --  454*  --   --  422*  --    < > = values in this interval not displayed.    Basic Metabolic Panel: Recent Labs  Lab 10/11/22 0857 10/12/22 0318 10/13/22 0129 10/13/22 0436 10/13/22 1600 10/14/22 0014 10/14/22 0221 10/14/22 0328 10/14/22 1422 10/14/22 1431 10/15/22 0243 10/15/22 0550 10/15/22 1501 10/16/22 0134 10/16/22 0355  NA 133*   < > 135   < >  --    < >  130*   < > 131*   < > 130* 131* 129* 126* 128*  K 4.4   < > 4.4   < >  --    < > 4.6   < > 4.4   < > 4.9 5.1 5.2* 5.3* 5.4*  CL 99   < > 101  --   --   --  97*  --  96*  --  93*  --   --  93*  --   CO2 18*   < > 20*  --   --   --  20*  --  21*  --  19*  --   --  21*  --   GLUCOSE 236*   < > 148*  --   --   --  236*  --  202*  --  194*  --   --  227*  --   BUN 72*   < > 93*  --   --   --  84*  --  79*  --  78*  --   --  77*  --   CREATININE 2.21*   < > 2.14*  --   --   --  2.23*  --  2.20*  --  2.23*  --   --  2.14*  --   CALCIUM 7.5*   < > 7.7*  --   --   --  7.3*  --  7.6*  --  7.3*  --   --  7.2*  --   MG 1.8  --   --   --  1.9  --  2.0  --  1.7  --  1.9  --   --   --   --   PHOS 4.3   < > 3.9  --  6.1*  --  6.0*  --  6.1*  --  6.1*  --   --   --   --    < > = values in this interval not displayed.   GFR: Estimated Creatinine Clearance: 45.5 mL/min (A) (by C-G formula based on SCr of 2.14 mg/dL (H)). Recent Labs  Lab 10/13/22 0123 10/14/22 0221 10/14/22 1422 10/14/22 1707 10/15/22 0243 10/16/22 0134  PROCALCITON 0.51  --   --   --   --   --   WBC 33.0* 25.2*  --   --  13.9* 10.4   LATICACIDVEN  --   --  1.4 1.1  --   --     Liver Function Tests: Recent Labs  Lab 10/10/22 0216 10/11/22 0857 10/12/22 0318 10/13/22 0129  AST 13*  --   --   --   ALT 17  --   --   --   ALKPHOS 180*  --   --   --   BILITOT 0.6  --   --   --   PROT 6.8  --   --   --   ALBUMIN 2.6* 2.6* 2.6* 2.5*   No results for input(s): "LIPASE", "AMYLASE" in the last 168 hours. No results for input(s): "AMMONIA" in the last 168 hours.  ABG    Component Value Date/Time   PHART 7.312 (L) 10/16/2022 0355   PCO2ART 47.2 10/16/2022 0355   PO2ART 110 (H) 10/16/2022 0355   HCO3 23.7 10/16/2022 0355   TCO2 25 10/16/2022 0355   ACIDBASEDEF 2.0 10/16/2022 0355   O2SAT 98 10/16/2022 0355     Coagulation Profile: No results for input(s): "  INR", "PROTIME" in the last 168 hours.  Cardiac Enzymes: No results for input(s): "CKTOTAL", "CKMB", "CKMBINDEX", "TROPONINI" in the last 168 hours.  HbA1C: Hgb A1c MFr Bld  Date/Time Value Ref Range Status  08/27/2022 03:43 AM 6.7 (H) 4.8 - 5.6 % Final    Comment:    (NOTE)         Prediabetes: 5.7 - 6.4         Diabetes: >6.4         Glycemic control for adults with diabetes: <7.0   11/21/2017 06:49 PM 9.9 (H) 4.8 - 5.6 % Final    Comment:    (NOTE) Pre diabetes:          5.7%-6.4% Diabetes:              >6.4% Glycemic control for   <7.0% adults with diabetes     CBG: Recent Labs  Lab 10/16/22 0320 10/16/22 0424 10/16/22 0522 10/16/22 0621 10/16/22 0737  GLUCAP 185* 163* 150* 229* 196*    Review of Systems:   10 point review of system taken, please see HPI for positives and negatives.   Past Medical History:  She,  has a past medical history of Allergy, Anemia associated with chronic renal failure, Anxiety, Asthma, Atypical chest pain, AV (arteriovenous fistula) (HCC), CAD (coronary artery disease) (cardiologist-- dr Sharol Roussel [facility] [place] [facility] in [place])), Depression, Elevated lipids, ESRD on hemodialysis (HCC)  (NEPHROLOGIST-  DR MATTINGLY), Gastroparesis, GERD (gastroesophageal reflux disease), History of pneumonia, Hyperlipidemia, Hyperthyroidism, Menorrhagia, Other secondary hypertension, Peripheral neuropathy, PONV (postoperative nausea and vomiting), Pre-transplant evaluation for kidney transplant, Renal failure syndrome (03/05/2022), Secondary hyperparathyroidism of renal origin [facility]), Sleep apnea, and Type 2 diabetes mellitus, with long-term current use of insulin (HCC).   Surgical History:   Past Surgical History:  Procedure Laterality Date   AV FISTULA PLACEMENT  08/2010   Left radiocephalic AVF   AV FISTULA PLACEMENT Right 05/07/2014   Procedure: ARTERIOVENOUS (AV) FISTULA CREATION;  Surgeon: Sherren Kerns, MD;  Location: [facility] OR;  Service: Vascular;  Laterality: Right;   AV FISTULA PLACEMENT Right 07/02/2014   Procedure: INSERTION OF ARTERIOVENOUS (AV) GORE-TEX GRAFT ARM;  Surgeon: Sherren Kerns, MD;  Location: [facility] OR;  Service: Vascular;  Laterality: Right;   CARDIOVASCULAR STRESS TEST  06/25/2016   [facility]   normal nuclear perfusion study w/ no ischemia/  normal LV function and wall motion , ef 51%   CESAREAN SECTION  03/22/2001   w/ Bilateral Tubal Ligation   COLONOSCOPY     COLONOSCOPY WITH ESOPHAGOGASTRODUODENOSCOPY (EGD)  10/19/2011   DILATION AND CURETTAGE OF UTERUS  05/12/2000   w/ suction for missed ab   DOBUTAMINE STRESS ECHO  06/04/2016    [facility]   normal stress echo w/ no chest pain or ischemia/  normal LV function and wall motion , stress ef 60-65%   ESOPHAGOGASTRODUODENOSCOPY  10/19/2011   Dr. Stan Head   FEMUR IM NAIL Left child   removed   FOOT SURGERY Left 2014 approx.   HYSTEROSCOPY WITH NOVASURE N/A 08/19/2016   Procedure: HYSTEROSCOPY WITH NOVASURE;  Surgeon: Huel Cote, MD;  Location: [facility];  Service: Gynecology;  Laterality: N/A;   KIDNEY TRANSPLANT  09/18/2016   LAPAROSCOPIC CHOLECYSTECTOMY  1994   LEFT HEART CATHETERIZATION WITH  CORONARY ANGIOGRAM N/A 06/27/2012   Procedure: LEFT HEART CATHETERIZATION WITH CORONARY ANGIOGRAM;  Surgeon: Laurey Morale, MD;  Location: [facility] CATH LAB;  Service: Cardiovascular;  Laterality: N/A; No ostructive CAD, normal  LVF, ef 55-60% (mild LAD luminal irregularities, moderate dRCA diffuse disease)   LIGATION GORETEX FISTULA  01/04/11   Left AVF   LIGATION OF ARTERIOVENOUS  FISTULA Left 08/21/2014   Procedure: LIGATION OF ARTERIOVENOUS  FISTULA;  Surgeon: Annice Needy, MD;  Location: [facility] ORS;  Service: Vascular;  Laterality: Left;   LIGATION OF ARTERIOVENOUS  FISTULA Left 06/29/2017   Procedure: LIGATION OF ARTERIOVENOUS  FISTULA ( RADIOCEPHALIC );  Surgeon: Annice Needy, MD;  Location: [facility] ORS;  Service: Vascular;  Laterality: Left;   NEPHRECTOMY TRANSPLANTED ORGAN     PERIPHERAL VASCULAR CATHETERIZATION Left 08/08/2014   Procedure: Upper Extremity Angiography;  Surgeon: Annice Needy, MD;  Location: [facility] INVASIVE CV LAB;  Service: Cardiovascular;  Laterality: Left;   PERIPHERAL VASCULAR CATHETERIZATION Left 08/08/2014   Procedure: Upper Extremity Intervention;  Surgeon: Annice Needy, MD;  Location: [facility] INVASIVE CV LAB;  Service: Cardiovascular;  Laterality: Left;   PERIPHERAL VASCULAR CATHETERIZATION Left 03/08/2016   Procedure: A/V Fistulagram;  Surgeon: Annice Needy, MD;  Location: [facility] INVASIVE CV LAB;  Service: Cardiovascular;  Laterality: Left;   POLYPECTOMY     THROMBECTOMY AND REVISION OF ARTERIOVENTOUS (AV) GORETEX  GRAFT Right 07/16/2014   Procedure: THROMBECTOMY  Right  arm  ARTERIOVENOUS  GORETEX  GRAFT;  Surgeon: Sherren Kerns, MD;  Location: [facility] OR;  Service: Vascular;  Laterality: Right;   TRANSTHORACIC ECHOCARDIOGRAM  06/04/2016   mild concentric LVH, ef 55-60%,  mild TR,  borderline LAE,  trivial MR and PR     Social History:   reports that she has never smoked. She has never used smokeless tobacco. She reports that she does not drink alcohol and does not use drugs.   Family  History:  Her family history includes Arthritis in her brother; Diabetes in her father, maternal aunt, and paternal aunt; Heart disease in her brother, mother, and paternal aunt; Hypertension in her father, maternal aunt, and paternal aunt; Kidney disease in her maternal grandmother and mother; Kidney failure in her paternal aunt and paternal uncle. There is no history of Colon cancer, Colon polyps, Crohn's disease, Esophageal cancer, Rectal cancer, Stomach cancer, or Ulcerative colitis.   Allergies Allergies  Allergen Reactions   Ciprofloxacin Hives, Itching and Nausea And Vomiting   Fluocinolone Itching, Nausea And Vomiting and Other (See Comments)   Hydromorphone Hives   Strawberry Extract Itching and Other (See Comments)    Pt states that this medication makes her tongue raw.   No reaction   Hydromorphone Hcl Hives   Phenytoin Sodium Extended Itching   Adhesive [Tape] Itching and Rash   Chlorhexidine Gluconate Itching   Clindamycin Diarrhea, Nausea And Vomiting and Rash   Shrimp [Shellfish Allergy] Rash    The patient is critically ill with multiple organ systems failure and requires high complexity decision making for assessment and support, frequent evaluation and titration of therapies, application of advanced monitoring technologies and extensive interpretation of multiple databases. Critical Care Time devoted to patient care services described in this note independent of APP/resident time (if applicable)  is 40 minutes.   Virl Diamond MD [facility] Personal pager: See Amion If unanswered, please page CCM On-call: #[phone]

## 2022-10-16 NOTE — Progress Notes (Addendum)
Asked to see patient, some been present chronic  Ordered 1 dose of Dilaudid Start nebulization treatment and 125 Solu-Medrol for bronchospasm  Will continue to monitor  If she does not continue to improve, may need as needed paralytics   ABG ordered  Critical care time-32 min

## 2022-10-17 ENCOUNTER — Other Ambulatory Visit: Payer: Self-pay

## 2022-10-17 ENCOUNTER — Inpatient Hospital Stay (HOSPITAL_COMMUNITY): Payer: Medicare (Managed Care)

## 2022-10-17 DIAGNOSIS — J189 Pneumonia, unspecified organism: Secondary | ICD-10-CM | POA: Diagnosis not present

## 2022-10-17 LAB — GLUCOSE, CAPILLARY
Glucose-Capillary: 135 mg/dL — ABNORMAL HIGH (ref 70–99)
Glucose-Capillary: 139 mg/dL — ABNORMAL HIGH (ref 70–99)
Glucose-Capillary: 148 mg/dL — ABNORMAL HIGH (ref 70–99)
Glucose-Capillary: 153 mg/dL — ABNORMAL HIGH (ref 70–99)
Glucose-Capillary: 163 mg/dL — ABNORMAL HIGH (ref 70–99)
Glucose-Capillary: 163 mg/dL — ABNORMAL HIGH (ref 70–99)
Glucose-Capillary: 168 mg/dL — ABNORMAL HIGH (ref 70–99)
Glucose-Capillary: 171 mg/dL — ABNORMAL HIGH (ref 70–99)
Glucose-Capillary: 174 mg/dL — ABNORMAL HIGH (ref 70–99)
Glucose-Capillary: 174 mg/dL — ABNORMAL HIGH (ref 70–99)
Glucose-Capillary: 178 mg/dL — ABNORMAL HIGH (ref 70–99)
Glucose-Capillary: 180 mg/dL — ABNORMAL HIGH (ref 70–99)
Glucose-Capillary: 180 mg/dL — ABNORMAL HIGH (ref 70–99)
Glucose-Capillary: 182 mg/dL — ABNORMAL HIGH (ref 70–99)
Glucose-Capillary: 190 mg/dL — ABNORMAL HIGH (ref 70–99)

## 2022-10-17 LAB — BASIC METABOLIC PANEL
Anion gap: 11 (ref 5–15)
BUN: 88 mg/dL — ABNORMAL HIGH (ref 6–20)
CO2: 23 mmol/L (ref 22–32)
Calcium: 7.6 mg/dL — ABNORMAL LOW (ref 8.9–10.3)
Chloride: 92 mmol/L — ABNORMAL LOW (ref 98–111)
Creatinine, Ser: 2.27 mg/dL — ABNORMAL HIGH (ref 0.44–1.00)
GFR, Estimated: 26 mL/min — ABNORMAL LOW (ref >60)
Glucose, Bld: 156 mg/dL — ABNORMAL HIGH (ref 70–99)
Potassium: 5.4 mmol/L — ABNORMAL HIGH (ref 3.5–5.1)
Sodium: 126 mmol/L — ABNORMAL LOW (ref 135–145)

## 2022-10-17 LAB — CBC
HCT: 27.1 % — ABNORMAL LOW (ref 36.0–46.0)
Hemoglobin: 8.8 g/dL — ABNORMAL LOW (ref 12.0–15.0)
MCH: 25.8 pg — ABNORMAL LOW (ref 26.0–34.0)
MCHC: 32.5 g/dL (ref 30.0–36.0)
MCV: 79.5 fL — ABNORMAL LOW (ref 80.0–100.0)
Platelets: 466 10*3/uL — ABNORMAL HIGH (ref 150–400)
RBC: 3.41 MIL/uL — ABNORMAL LOW (ref 3.87–5.11)
RDW: 14.7 % (ref 11.5–15.5)
WBC: 15.1 10*3/uL — ABNORMAL HIGH (ref 4.0–10.5)
nRBC: 0.9 % — ABNORMAL HIGH (ref 0.0–0.2)

## 2022-10-17 LAB — POCT I-STAT 7, (LYTES, BLD GAS, ICA,H+H)
Acid-base deficit: 2 mmol/L (ref 0.0–2.0)
Bicarbonate: 24.7 mmol/L (ref 20.0–28.0)
Calcium, Ion: 1.04 mmol/L — ABNORMAL LOW (ref 1.15–1.40)
HCT: 27 % — ABNORMAL LOW (ref 36.0–46.0)
Hemoglobin: 9.2 g/dL — ABNORMAL LOW (ref 12.0–15.0)
O2 Saturation: 93 %
Patient temperature: 36.5
Potassium: 5.5 mmol/L — ABNORMAL HIGH (ref 3.5–5.1)
Sodium: 126 mmol/L — ABNORMAL LOW (ref 135–145)
TCO2: 26 mmol/L (ref 22–32)
pCO2 arterial: 47.4 mmHg (ref 32–48)
pH, Arterial: 7.323 — ABNORMAL LOW (ref 7.35–7.45)
pO2, Arterial: 70 mmHg — ABNORMAL LOW (ref 83–108)

## 2022-10-17 LAB — PNEUMOCYSTIS PCR: Result Pneumocystis PCR: NEGATIVE

## 2022-10-17 LAB — MAGNESIUM: Magnesium: 2.4 mg/dL (ref 1.7–2.4)

## 2022-10-17 LAB — PHOSPHORUS: Phosphorus: 6.1 mg/dL — ABNORMAL HIGH (ref 2.5–4.6)

## 2022-10-17 MED ORDER — SODIUM ZIRCONIUM CYCLOSILICATE 10 G PO PACK
10.0000 g | PACK | Freq: Once | ORAL | Status: AC
  Administered 2022-10-17: 10 g
  Filled 2022-10-17: qty 1

## 2022-10-17 MED ORDER — MYCOPHENOLATE 200 MG/ML ORAL SUSPENSION
1000.0000 mg | Freq: Two times a day (BID) | ORAL | Status: DC
  Administered 2022-10-17 – 2022-10-27 (×20): 1000 mg
  Filled 2022-10-17 (×23): qty 5

## 2022-10-17 MED ORDER — MYCOPHENOLATE SODIUM 180 MG PO TBEC: 720.0000 mg | DELAYED_RELEASE_TABLET | Freq: Two times a day (BID) | ORAL | Status: DC

## 2022-10-17 MED ORDER — TACROLIMUS 1 MG PO CAPS
1.0000 mg | ORAL_CAPSULE | Freq: Two times a day (BID) | ORAL | Status: DC
  Administered 2022-10-17 – 2022-10-21 (×9): 1 mg via SUBLINGUAL
  Filled 2022-10-17 (×11): qty 1

## 2022-10-17 MED ORDER — TACROLIMUS 1 MG/ML ORAL SUSPENSION: 2.0000 mg | Freq: Two times a day (BID) | ORAL | Status: DC

## 2022-10-17 MED ORDER — FUROSEMIDE 10 MG/ML IJ SOLN
80.0000 mg | Freq: Three times a day (TID) | INTRAMUSCULAR | Status: AC
  Administered 2022-10-17 – 2022-10-18 (×3): 80 mg via INTRAVENOUS
  Filled 2022-10-17 (×3): qty 8

## 2022-10-17 MED ORDER — AMLODIPINE BESYLATE 10 MG PO TABS
10.0000 mg | ORAL_TABLET | Freq: Every day | ORAL | Status: DC
  Administered 2022-10-17 – 2022-10-27 (×11): 10 mg
  Filled 2022-10-17 (×11): qty 1

## 2022-10-17 MED ORDER — SULFAMETHOXAZOLE-TRIMETHOPRIM 400-80 MG PO TABS
1.0000 | ORAL_TABLET | ORAL | Status: DC
  Administered 2022-10-18 – 2022-10-27 (×5): 1
  Filled 2022-10-17 (×5): qty 1

## 2022-10-17 MED ORDER — METHYLPREDNISOLONE SODIUM SUCC 40 MG IJ SOLR
40.0000 mg | Freq: Two times a day (BID) | INTRAMUSCULAR | Status: DC
  Administered 2022-10-17: 40 mg via INTRAVENOUS
  Filled 2022-10-17 (×2): qty 1

## 2022-10-17 MED ORDER — CLEVIDIPINE BUTYRATE 0.5 MG/ML IV EMUL
0.0000 mg/h | INTRAVENOUS | Status: DC
  Administered 2022-10-17: 10 mg/h via INTRAVENOUS
  Administered 2022-10-17: 12 mg/h via INTRAVENOUS
  Administered 2022-10-17: 6 mg/h via INTRAVENOUS
  Administered 2022-10-17: 10 mg/h via INTRAVENOUS
  Administered 2022-10-18: 12 mg/h via INTRAVENOUS
  Administered 2022-10-18: 4 mg/h via INTRAVENOUS
  Administered 2022-10-18: 12 mg/h via INTRAVENOUS
  Administered 2022-10-18: 10 mg/h via INTRAVENOUS
  Administered 2022-10-19 (×2): 6 mg/h via INTRAVENOUS
  Administered 2022-10-19: 5 mg/h via INTRAVENOUS
  Administered 2022-10-20: 8 mg/h via INTRAVENOUS
  Administered 2022-10-20: 5 mg/h via INTRAVENOUS
  Administered 2022-10-20 (×2): 6 mg/h via INTRAVENOUS
  Administered 2022-10-21: 8 mg/h via INTRAVENOUS
  Administered 2022-10-21: 12 mg/h via INTRAVENOUS
  Administered 2022-10-21: 10 mg/h via INTRAVENOUS
  Administered 2022-10-21: 8 mg/h via INTRAVENOUS
  Administered 2022-10-22: 7 mg/h via INTRAVENOUS
  Administered 2022-10-22: 8 mg/h via INTRAVENOUS
  Administered 2022-10-22: 9 mg/h via INTRAVENOUS
  Administered 2022-10-22: 7 mg/h via INTRAVENOUS
  Administered 2022-10-23: 16 mg/h via INTRAVENOUS
  Administered 2022-10-23: 12 mg/h via INTRAVENOUS
  Administered 2022-10-23: 18 mg/h via INTRAVENOUS
  Administered 2022-10-23: 9 mg/h via INTRAVENOUS
  Administered 2022-10-23: 16 mg/h via INTRAVENOUS
  Administered 2022-10-23: 6 mg/h via INTRAVENOUS
  Administered 2022-10-24: 15 mg/h via INTRAVENOUS
  Administered 2022-10-24: 16 mg/h via INTRAVENOUS
  Administered 2022-10-24: 19 mg/h via INTRAVENOUS
  Administered 2022-10-24: 16 mg/h via INTRAVENOUS
  Administered 2022-10-24: 19 mg/h via INTRAVENOUS
  Administered 2022-10-24: 18 mg/h via INTRAVENOUS
  Administered 2022-10-24: 16 mg/h via INTRAVENOUS
  Administered 2022-10-24: 18 mg/h via INTRAVENOUS
  Administered 2022-10-25 (×2): 19 mg/h via INTRAVENOUS
  Administered 2022-10-25: 2 mg/h via INTRAVENOUS
  Administered 2022-10-25: 19 mg/h via INTRAVENOUS
  Administered 2022-10-26: 8 mg/h via INTRAVENOUS
  Administered 2022-10-26: 6 mg/h via INTRAVENOUS
  Administered 2022-10-26: 10 mg/h via INTRAVENOUS
  Administered 2022-10-26 – 2022-10-27 (×2): 8 mg/h via INTRAVENOUS
  Administered 2022-10-27: 4 mg/h via INTRAVENOUS
  Administered 2022-10-27: 6 mg/h via INTRAVENOUS
  Administered 2022-10-27: 10 mg/h via INTRAVENOUS
  Administered 2022-10-28: 4 mg/h via INTRAVENOUS
  Filled 2022-10-17: qty 100
  Filled 2022-10-17: qty 50
  Filled 2022-10-17 (×6): qty 100
  Filled 2022-10-17: qty 50
  Filled 2022-10-17 (×4): qty 100
  Filled 2022-10-17: qty 50
  Filled 2022-10-17 (×31): qty 100
  Filled 2022-10-17: qty 50
  Filled 2022-10-17 (×7): qty 100
  Filled 2022-10-17: qty 50

## 2022-10-17 NOTE — Progress Notes (Addendum)
[facility] Progress Note   Subjective:  Remains intubated.  Pulm status is improving.  Cleviprex gtt yesterday for HTN - BP fine this AM but still on gtt.   I/Os yest 6.4 / 2.7 - getting lasix 40 BID --- net + 20L for admit  Objective Vitals:   10/17/22 0630 10/17/22 0645 10/17/22 0700 10/17/22 0742  BP:    137/61  Pulse: 96 95 95   Resp: (!) 24 19 (!) 23   Temp: 97.7 F (36.5 C) 97.7 F (36.5 C) 97.7 F (36.5 C)   TempSrc:      SpO2: 99% 100% 100%   Weight:      Height:       Physical Exam General appearance: intubated and sedated Resp: on vent, rhonchi throughout Cardio: regular rate and rhythm, S1, S2 normal, no murmur, click, rub or gallop GI: soft, non-tender; bowel sounds normal; no masses,  no organomegaly and renal allograft in RLQ, no tenderness or bruits. Extremities: 3+ diffuse edema  Additional Objective Labs: Basic Metabolic Panel: Recent Labs  Lab 10/14/22 1422 10/14/22 1431 10/15/22 0243 10/15/22 0550 10/16/22 0134 10/16/22 0355 10/16/22 1558 10/17/22 0457 10/17/22 0540  NA 131*   < > 130*   < > 126*   < > 128* 126* 126*  K 4.4   < > 4.9   < > 5.3*   < > 5.4* 5.4* 5.5*  CL 96*  --  93*  --  93*  --   --  92*  --   CO2 21*  --  19*  --  21*  --   --  23  --   GLUCOSE 202*  --  194*  --  227*  --   --  156*  --   BUN 79*  --  78*  --  77*  --   --  88*  --   CREATININE 2.20*  --  2.23*  --  2.14*  --   --  2.27*  --   CALCIUM 7.6*  --  7.3*  --  7.2*  --   --  7.6*  --   PHOS 6.1*  --  6.1*  --   --   --   --  6.1*  --    < > = values in this interval not displayed.   Liver Function Tests: Recent Labs  Lab 10/11/22 0857 10/12/22 0318 10/13/22 0129  ALBUMIN 2.6* 2.6* 2.5*   No results for input(s): "LIPASE", "AMYLASE" in the last 168 hours. CBC: Recent Labs  Lab 10/11/22 0857 10/12/22 0318 10/13/22 0123 10/13/22 0436 10/14/22 0221 10/14/22 0328 10/15/22 0243 10/15/22 0550 10/16/22 0134 10/16/22 0355 10/16/22 1558  10/17/22 0457 10/17/22 0540  WBC 26.9* 25.3* 33.0*  --  25.2*  --  13.9*  --  10.4  --   --  15.1*  --   NEUTROABS 23.9* 22.5*  --   --   --   --  12.3*  --   --   --   --   --   --   HGB 10.3* 10.5* 10.1*   < > 9.3*   < > 8.6*   < > 8.4*   < > 9.9* 8.8* 9.2*  HCT 31.9* 32.5* 30.9*   < > 29.2*   < > 27.0*   < > 25.5*   < > 29.0* 27.1* 27.0*  MCV 81.4 80.8 78.2*  --  81.1  --  77.8*  --  78.7*  --   --  79.5*  --   PLT 411* 397 550*  --  483*  --  454*  --  422*  --   --  466*  --    < > = values in this interval not displayed.   Blood Culture    Component Value Date/Time   SDES BRONCHIAL ALVEOLAR LAVAGE 10/13/2022 0905   SPECREQUEST NONE 10/13/2022 0905   CULT  10/13/2022 0905    NO GROWTH 2 DAYS Performed at [facility], [address] [place], [place] [address]    REPTSTATUS 10/16/2022 FINAL 10/13/2022 4401    Cardiac Enzymes: No results for input(s): "CKTOTAL", "CKMB", "CKMBINDEX", "TROPONINI" in the last 168 hours. CBG: Recent Labs  Lab 10/17/22 0034 10/17/22 0253 10/17/22 0357 10/17/22 0456 10/17/22 0629  GLUCAP 171* 190* 180* 180* 168*   Iron Studies:  No results for input(s): "IRON", "TIBC", "TRANSFERRIN", "FERRITIN" in the last 72 hours.  @lablastinr3 @ Studies/Results: DG Abd 1 View  Result Date: 10/16/2022 CLINICAL DATA:  Constipation. EXAM: ABDOMEN - 1 VIEW COMPARISON:  Abdominal x-ray dated October 14, 2022. FINDINGS: Unchanged enteric tube in the stomach.  Normal bowel gas pattern. IMPRESSION: 1. No acute findings. Electronically Signed   By: Obie Dredge M.D.   On: 10/16/2022 12:53   Medications:  sodium chloride 10 mL/hr at 10/17/22 0700   clevidipine 10 mg/hr (10/17/22 0700)   feeding supplement (VITAL AF 1.2 CAL) 60 mL/hr at 10/17/22 0700   fentaNYL infusion INTRAVENOUS 400 mcg/hr (10/17/22 0700)   insulin 9 Units/hr (10/17/22 0700)   midazolam 12 mg/hr (10/17/22 0700)   piperacillin-tazobactam Stopped (10/17/22 0426)    sulfamethoxazole-trimethoprim 350 mL/hr at 10/17/22 0700    arformoterol  15 mcg Nebulization BID   ARIPiprazole  30 mg Per Tube Daily   artificial tears  1 Application Both Eyes Q8H   atorvastatin  10 mg Per Tube Daily   buPROPion  225 mg Per Tube BID   docusate  100 mg Per Tube BID   famotidine  20 mg Per Tube Daily   fentaNYL (SUBLIMAZE) injection  25 mcg Intravenous Once   folic acid  1 mg Per Tube Daily   furosemide  40 mg Intravenous BID   guaiFENesin  400 mg Per Tube Q4H   heparin  5,000 Units Subcutaneous Q8H   hydrOXYzine  25 mg Per Tube QHS   lactulose  20 g Per Tube BID   loratadine  10 mg Per Tube Daily   methylPREDNISolone (SOLU-MEDROL) injection  65 mg Intravenous Q12H   montelukast  10 mg Per Tube QHS   multivitamin with minerals  1 tablet Per Tube Daily   mouth rinse  15 mL Mouth Rinse Q2H   polyethylene glycol  17 g Per Tube BID   revefenacin  175 mcg Nebulization Daily   senna-docusate  1 tablet Per Tube BID   sodium bicarbonate  1,300 mg Per Tube BID    Assessment/Plan:  AKI/CKD stage IIIa following DDKT- Baseline in the 1.4-1.6 range it appears. Admission Cr 5 presumably due to ischemic ATN in setting of poor po intake and diarrhea for the past 5-6 days.  Scr has improved to low 2s and stable.  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  Continue daily labs for now.  OK with PRN diuretics for volume - still grossly net + with lasix 40 IV BID, increase to 80 TID.   Avoid nephrotoxic medications including NSAIDs and iodinated intravenous contrast exposure unless the latter is absolutely indicated.  Preferred narcotic agents for pain control are hydromorphone, fentanyl, and methadone. Morphine should not be used. Avoid Baclofen and avoid oral sodium phosphate and magnesium citrate based laxatives / bowel preps. Continue strict Input and Output monitoring. Will monitor the patient closely with you and intervene or adjust therapy as indicated by changes in clinical  status/labs  Acute hypoxic respiratory failure with acute asthma exacerbation  + pneumonia - broad spectrum antibiotics per primary svc.  On prednisone as well.   Non contrasted CT chest with extensive BL multifocal opacities. Required intubation 7/10; s/p bronch with studies unrevealing to date.  On PJP (result still pending) and bacterial PNA tx.  As above lasix today.  Immunosuppressive therapy - normally on myfortic 720 mg bid, prograf 5 mg qam and 4 mg qpm, and prednisone 5 mg.  Held myfortic in setting of severe illness.  Tac level pending 7/7 at 21:50 - level 84.5 - seemed to be timed correctly for a trough but clearly not.  Held tac and resent level 7/11 (hadn't got dose yet) with level 8.4.  Resume today at 2 BID and check level on 7/16 (ordered) addendum: per pharm no suspension available so giving sublingual route thus it'll be 1 BID.  Resume cellcept in place of myfortic which cannot be crushed; when taking po go back to myfortic.   Resume home dose pred after pred burst for pulm indications.  AGMA - due to #1 and diarrhea.  S/p LR and on po bicarb now - acceptable   CAD - not an acute issue. DM type 2 - per primary svc Anemia of CKD stage III - continue to follow and transfuse for Hgb <7 HTN -On cleviprex currently with BP 130s. Volume overloaded - diuresing.  Most of volume is coming from bactrim - pharm to try to switch to po/per tube.  Hyperkalemia - bactrim, AKI: lokelma PRN, dose today.    Will continue to follow, reach out with concerns.   Estill Bakes MD 10/17/2022, 8:18 AM  [facility] Pager: [phone]

## 2022-10-17 NOTE — Progress Notes (Signed)
NAME:  Linda Ellison, MRN:  [id number], DOB:  Nov 05, 1975, LOS: 7 ADMISSION DATE:  10/09/2022, CONSULTATION DATE: 10/12/2022 REFERRING MD: Triad, CHIEF COMPLAINT: Respiratory distress  History of Present Illness:  47 year old female with extensive past medical history is well-documented below who was admitted 2 days prior to this consult with multifocal pneumonia.  She has had increasing work of breathing with a respiratory rate of 46.  She had a CT scan that demonstrates bilateral multifocal pneumonia, elevated BNP, and increased work of breathing that will require her being transferred to the intensive care unit.  She has noted a nonproductive cough prior to admission.  Increasing shortness of breath prior to admission.  And increasing lower extremity edema.  She is status post cadaveric renal transplant and she is followed by nephrology.  Currently she is hypokalemic with creatinine in the 2.2 range.  Due to her increasing work of breathing impending respiratory failure she will be transferred to intensive care unit for further evaluation and treatment.  She has already been started on aggressive diuresis and steroids and empirical antimicrobial therapy.  Pertinent  Medical History   Past Medical History:  Diagnosis Date   Allergy    Anemia associated with chronic renal failure    Anxiety    Asthma    Atypical chest pain    long-standing -- normal cardio cath 06-27-2012 and normal nuclear stress test 06-25-2016   AV (arteriovenous fistula) (HCC)    for dialysis-- currently located left radiocephalic   CAD (coronary artery disease) cardiologist-- dr Sharol Roussel [facility] cardiology in [place])   a. False positive stress echo 06/2012 at [facility] - cath with no obstructive CAD at [facility] (mild luminal irregularities in LAD, moderate diffuse disease in distal RCA).   Depression    Elevated lipids    ESRD on hemodialysis [facility]) NEPHROLOGIST-  DR MATTINGLY   started dialysis 01/29/11:  [facility] on MWF   Gastroparesis    GERD (gastroesophageal reflux disease)    History of pneumonia    HCAP 04/ 2017   Hyperlipidemia    Hyperthyroidism    Menorrhagia    Other secondary hypertension    associated to diabetes -- followed by cardiologist ( dr Sharol Roussel)     Peripheral neuropathy    PONV (postoperative nausea and vomiting)    Pre-transplant evaluation for kidney transplant    [facility]   Renal failure syndrome 03/05/2022   Secondary hyperparathyroidism of renal origin [facility])    Sleep apnea    Type 2 diabetes mellitus, with long-term current use of insulin (HCC)    dx 1985     Significant Hospital Events: Including procedures, antibiotic start and stop dates in addition to other pertinent events   07/09 Transferred to ICU  07/10: Intubated  7/13-improving O2 requirement  Interim History / Subjective:  Significant ventilator dyssynchrony requiring pushes of vecuronium Uncontrolled hypertension-on Cleviprex  Objective   Blood pressure 137/61, pulse 95, temperature 97.7 F (36.5 C), resp. rate (!) 23, height 5\' 6"  (1.676 m), weight 129.6 kg, SpO2 100%.      Vent Mode: PRVC FiO2 (%):  [30 %-40 %] 40 % Set Rate:  [24 bmp-30 bmp] 24 bmp Vt Set:  [470 mL] 470 mL PEEP:  [5 cmH20] 5 cmH20 Plateau Pressure:  [20 cmH20-23 cmH20] 20 cmH20   Intake/Output Summary (Last 24 hours) at 10/17/2022 1008 Last data filed at 10/17/2022 0800 Gross per 24 hour  Intake 5189.82 ml  Output  1925 ml  Net 3264.82 ml   Filed Weights   10/15/22 0356 10/16/22 0500 10/17/22 0500  Weight: 128.1 kg 130.6 kg 129.6 kg    Examination: General: Middle-aged, sedated, chronically ill-appearing HENT: Moist oral mucosa, endotracheal tube in place Lungs: Bibasal rales, coarse breath sounds bilaterally Cardiovascular: S1-S2 appreciated abdomen is soft, bowel sounds appreciated Abdomen: Abdomen is soft, bowel sounds  appreciated Extremities: 2+ pitting edema Neuro: Intubated and sedated  Improving fluid status on diuretics  On a PEEP of 10, PRVC, rate of 30, tidal volume 470  Labs: Labs reviewed: Sodium 126, potassium 5.5, BUN 88, creatinine 2.27  White count of 15.1, hemoglobin 9.2, hematocrit 27 micro: Few normal respiratory flora.  No Staph aureus or Pseudomonas. BAL: No organisms so far PJP PCR pending  Resolved Hospital Problem list     Assessment & Plan:   Acute hypoxemic respiratory failure secondary to multifocal pneumonia ARDS Concern for PJP pneumonia -Improving oxygen requirement -Day 5 of Bactrim -Day 6 of Zosyn  -Pneumocystis PCR noted to be negative -Will discontinue Bactrim -Start weaning down Solu-Medrol, changed to Solu-Medrol 40 every 12  Continue mechanical ventilation per ARDS protocol -Target TVol 6-8cc/kgIBW -Target Plateau Pressure < 30cm H20 -Target driving pressure less than 15 cm of water -Target PaO2 55-65: titrate PEEP/FiO2 per protocol -Ventilator associated pneumonia prevention protocol -Ventilator requirements improving  -Limit paralytics as best as possible as long as we do not have significant ventilator dyssynchrony  History of renal transplant secondary to acute renal failure Status post cadaveric renal transplantation on immunosuppressants Tacrolimus toxicity Acute kidney injury on chronic kidney disease stage IIIa -Continue to trend electrolytes -Avoid nephrotoxic medications -Continue to monitor output  Type 1 diabetes -On insulin infusion  Hypertension -Was on amlodipine -Required to be started on Cleviprex -Will continue to wean  Anemia of chronic illness -Continue to trend  Remains very sick with risk of worsening  Obtain chest x-ray today  Best Practice (right click and "Reselect all SmartList Selections" daily)   Diet/type: tubefeeds DVT prophylaxis: prophylactic heparin  GI prophylaxis: PPI Lines: N/A Foley:   N/A Code Status:  full code Last date of multidisciplinary goals of care discussion [discussed with significant other at bedside]  Labs   CBC: Recent Labs  Lab 10/11/22 0857 10/12/22 0318 10/13/22 0123 10/13/22 0436 10/14/22 0221 10/14/22 0328 10/15/22 0243 10/15/22 0550 10/16/22 0134 10/16/22 0355 10/16/22 1558 10/17/22 0457 10/17/22 0540  WBC 26.9* 25.3* 33.0*  --  25.2*  --  13.9*  --  10.4  --   --  15.1*  --   NEUTROABS 23.9* 22.5*  --   --   --   --  12.3*  --   --   --   --   --   --   HGB 10.3* 10.5* 10.1*   < > 9.3*   < > 8.6*   < > 8.4* 9.2* 9.9* 8.8* 9.2*  HCT 31.9* 32.5* 30.9*   < > 29.2*   < > 27.0*   < > 25.5* 27.0* 29.0* 27.1* 27.0*  MCV 81.4 80.8 78.2*  --  81.1  --  77.8*  --  78.7*  --   --  79.5*  --   PLT 411* 397 550*  --  483*  --  454*  --  422*  --   --  466*  --    < > = values in this interval not displayed.    Basic Metabolic Panel: Recent Labs  Lab 10/13/22  1600 10/14/22 0014 10/14/22 0221 10/14/22 0328 10/14/22 1422 10/14/22 1431 10/15/22 0243 10/15/22 0550 10/16/22 0134 10/16/22 0355 10/16/22 1558 10/17/22 0457 10/17/22 0540  NA  --    < > 130*   < > 131*   < > 130*   < > 126* 128* 128* 126* 126*  K  --    < > 4.6   < > 4.4   < > 4.9   < > 5.3* 5.4* 5.4* 5.4* 5.5*  CL  --   --  97*  --  96*  --  93*  --  93*  --   --  92*  --   CO2  --   --  20*  --  21*  --  19*  --  21*  --   --  23  --   GLUCOSE  --   --  236*  --  202*  --  194*  --  227*  --   --  156*  --   BUN  --   --  84*  --  79*  --  78*  --  77*  --   --  88*  --   CREATININE  --   --  2.23*  --  2.20*  --  2.23*  --  2.14*  --   --  2.27*  --   CALCIUM  --   --  7.3*  --  7.6*  --  7.3*  --  7.2*  --   --  7.6*  --   MG 1.9  --  2.0  --  1.7  --  1.9  --   --   --   --  2.4  --   PHOS 6.1*  --  6.0*  --  6.1*  --  6.1*  --   --   --   --  6.1*  --    < > = values in this interval not displayed.   GFR: Estimated Creatinine Clearance: 42.7 mL/min (A) (by C-G formula  based on SCr of 2.27 mg/dL (H)). Recent Labs  Lab 10/13/22 0123 10/14/22 0221 10/14/22 1422 10/14/22 1707 10/15/22 0243 10/16/22 0134 10/17/22 0457  PROCALCITON 0.51  --   --   --   --   --   --   WBC 33.0* 25.2*  --   --  13.9* 10.4 15.1*  LATICACIDVEN  --   --  1.4 1.1  --   --   --     Liver Function Tests: Recent Labs  Lab 10/11/22 0857 10/12/22 0318 10/13/22 0129  ALBUMIN 2.6* 2.6* 2.5*   No results for input(s): "LIPASE", "AMYLASE" in the last 168 hours. No results for input(s): "AMMONIA" in the last 168 hours.  ABG    Component Value Date/Time   PHART 7.323 (L) 10/17/2022 0540   PCO2ART 47.4 10/17/2022 0540   PO2ART 70 (L) 10/17/2022 0540   HCO3 24.7 10/17/2022 0540   TCO2 26 10/17/2022 0540   ACIDBASEDEF 2.0 10/17/2022 0540   O2SAT 93 10/17/2022 0540     Coagulation Profile: No results for input(s): "INR", "PROTIME" in the last 168 hours.  Cardiac Enzymes: No results for input(s): "CKTOTAL", "CKMB", "CKMBINDEX", "TROPONINI" in the last 168 hours.  HbA1C: Hgb A1c MFr Bld  Date/Time Value Ref Range Status  08/27/2022 03:43 AM 6.7 (H) 4.8 - 5.6 % Final    Comment:    (NOTE)  Prediabetes: 5.7 - 6.4         Diabetes: >6.4         Glycemic control for adults with diabetes: <7.0   11/21/2017 06:49 PM 9.9 (H) 4.8 - 5.6 % Final    Comment:    (NOTE) Pre diabetes:          5.7%-6.4% Diabetes:              >6.4% Glycemic control for   <7.0% adults with diabetes     CBG: Recent Labs  Lab 10/17/22 0253 10/17/22 0357 10/17/22 0456 10/17/22 0629 10/17/22 0837  GLUCAP 190* 180* 180* 168* 174*    Review of Systems:   10 point review of system taken, please see HPI for positives and negatives.   Past Medical History:  She,  has a past medical history of Allergy, Anemia associated with chronic renal failure, Anxiety, Asthma, Atypical chest pain, AV (arteriovenous fistula) (HCC), CAD (coronary artery disease) (cardiologist-- dr Sharol Roussel  [facility] in [place])), Depression, Elevated lipids, ESRD on hemodialysis (HCC) (NEPHROLOGIST-  DR MATTINGLY), Gastroparesis, GERD (gastroesophageal reflux disease), History of pneumonia, Hyperlipidemia, Hyperthyroidism, Menorrhagia, Other secondary hypertension, Peripheral neuropathy, PONV (postoperative nausea and vomiting), Pre-transplant evaluation for kidney transplant, Renal failure syndrome (03/05/2022), Secondary hyperparathyroidism of renal origin [facility]), Sleep apnea, and Type 2 diabetes mellitus, with long-term current use of insulin (HCC).   Surgical History:   Past Surgical History:  Procedure Laterality Date   AV FISTULA PLACEMENT  08/2010   Left radiocephalic AVF   AV FISTULA PLACEMENT Right 05/07/2014   Procedure: ARTERIOVENOUS (AV) FISTULA CREATION;  Surgeon: Sherren Kerns, MD;  Location: [facility] OR;  Service: Vascular;  Laterality: Right;   AV FISTULA PLACEMENT Right 07/02/2014   Procedure: INSERTION OF ARTERIOVENOUS (AV) GORE-TEX GRAFT ARM;  Surgeon: Sherren Kerns, MD;  Location: [facility] OR;  Service: Vascular;  Laterality: Right;   CARDIOVASCULAR STRESS TEST  06/25/2016   [facility]   normal nuclear perfusion study w/ no ischemia/  normal LV function and wall motion , ef 51%   CESAREAN SECTION  03/22/2001   w/ Bilateral Tubal Ligation   COLONOSCOPY     COLONOSCOPY WITH ESOPHAGOGASTRODUODENOSCOPY (EGD)  10/19/2011   DILATION AND CURETTAGE OF UTERUS  05/12/2000   w/ suction for missed ab   DOBUTAMINE STRESS ECHO  06/04/2016    [facility]   normal stress echo w/ no chest pain or ischemia/  normal LV function and wall motion , stress ef 60-65%   ESOPHAGOGASTRODUODENOSCOPY  10/19/2011   Dr. Stan Head   FEMUR IM NAIL Left child   removed   FOOT SURGERY Left 2014 approx.   HYSTEROSCOPY WITH NOVASURE N/A 08/19/2016   Procedure: HYSTEROSCOPY WITH NOVASURE;  Surgeon: Huel Cote, MD;  Location: [facility];  Service: Gynecology;  Laterality: N/A;   KIDNEY  TRANSPLANT  09/18/2016   LAPAROSCOPIC CHOLECYSTECTOMY  1994   LEFT HEART CATHETERIZATION WITH CORONARY ANGIOGRAM N/A 06/27/2012   Procedure: LEFT HEART CATHETERIZATION WITH CORONARY ANGIOGRAM;  Surgeon: Laurey Morale, MD;  Location: [facility] CATH LAB;  Service: Cardiovascular;  Laterality: N/A; No ostructive CAD, normal LVF, ef 55-60% (mild LAD luminal irregularities, moderate dRCA diffuse disease)   LIGATION GORETEX FISTULA  01/04/11   Left AVF   LIGATION OF ARTERIOVENOUS  FISTULA Left 08/21/2014   Procedure: LIGATION OF ARTERIOVENOUS  FISTULA;  Surgeon: Annice Needy, MD;  Location: [facility] ORS;  Service: Vascular;  Laterality: Left;   LIGATION OF ARTERIOVENOUS  FISTULA  Left 06/29/2017   Procedure: LIGATION OF ARTERIOVENOUS  FISTULA ( RADIOCEPHALIC );  Surgeon: Annice Needy, MD;  Location: [facility] ORS;  Service: Vascular;  Laterality: Left;   NEPHRECTOMY TRANSPLANTED ORGAN     PERIPHERAL VASCULAR CATHETERIZATION Left 08/08/2014   Procedure: Upper Extremity Angiography;  Surgeon: Annice Needy, MD;  Location: [facility] INVASIVE CV LAB;  Service: Cardiovascular;  Laterality: Left;   PERIPHERAL VASCULAR CATHETERIZATION Left 08/08/2014   Procedure: Upper Extremity Intervention;  Surgeon: Annice Needy, MD;  Location: [facility] INVASIVE CV LAB;  Service: Cardiovascular;  Laterality: Left;   PERIPHERAL VASCULAR CATHETERIZATION Left 03/08/2016   Procedure: A/V Fistulagram;  Surgeon: Annice Needy, MD;  Location: [facility] INVASIVE CV LAB;  Service: Cardiovascular;  Laterality: Left;   POLYPECTOMY     THROMBECTOMY AND REVISION OF ARTERIOVENTOUS (AV) GORETEX  GRAFT Right 07/16/2014   Procedure: THROMBECTOMY  Right  arm  ARTERIOVENOUS  GORETEX  GRAFT;  Surgeon: Sherren Kerns, MD;  Location: [facility] OR;  Service: Vascular;  Laterality: Right;   TRANSTHORACIC ECHOCARDIOGRAM  06/04/2016   mild concentric LVH, ef 55-60%,  mild TR,  borderline LAE,  trivial MR and PR     Social History:   reports that she has never smoked. She has never used  smokeless tobacco. She reports that she does not drink alcohol and does not use drugs.   Family History:  Her family history includes Arthritis in her brother; Diabetes in her father, maternal aunt, and paternal aunt; Heart disease in her brother, mother, and paternal aunt; Hypertension in her father, maternal aunt, and paternal aunt; Kidney disease in her maternal grandmother and mother; Kidney failure in her paternal aunt and paternal uncle. There is no history of Colon cancer, Colon polyps, Crohn's disease, Esophageal cancer, Rectal cancer, Stomach cancer, or Ulcerative colitis.   Allergies Allergies  Allergen Reactions   Ciprofloxacin Hives, Itching and Nausea And Vomiting   Fluocinolone Itching, Nausea And Vomiting and Other (See Comments)   Hydromorphone Hives   Strawberry Extract Itching and Other (See Comments)    Pt states that this medication makes her tongue raw.   No reaction   Hydromorphone Hcl Hives   Phenytoin Sodium Extended Itching   Adhesive [Tape] Itching and Rash   Chlorhexidine Gluconate Itching   Clindamycin Diarrhea, Nausea And Vomiting and Rash   Shrimp [Shellfish Allergy] Rash    The patient is critically ill with multiple organ systems failure and requires high complexity decision making for assessment and support, frequent evaluation and titration of therapies, application of advanced monitoring technologies and extensive interpretation of multiple databases. Critical Care Time devoted to patient care services described in this note independent of APP/resident time (if applicable)  is 32 minutes.   Virl Diamond MD  Pulmonary Critical Care Personal pager: See Amion If unanswered, please page CCM On-call: #[phone]

## 2022-10-18 ENCOUNTER — Inpatient Hospital Stay (HOSPITAL_COMMUNITY): Payer: Medicare (Managed Care)

## 2022-10-18 DIAGNOSIS — J189 Pneumonia, unspecified organism: Secondary | ICD-10-CM | POA: Diagnosis not present

## 2022-10-18 LAB — GLUCOSE, CAPILLARY
Glucose-Capillary: 121 mg/dL — ABNORMAL HIGH (ref 70–99)
Glucose-Capillary: 123 mg/dL — ABNORMAL HIGH (ref 70–99)
Glucose-Capillary: 124 mg/dL — ABNORMAL HIGH (ref 70–99)
Glucose-Capillary: 124 mg/dL — ABNORMAL HIGH (ref 70–99)
Glucose-Capillary: 125 mg/dL — ABNORMAL HIGH (ref 70–99)
Glucose-Capillary: 129 mg/dL — ABNORMAL HIGH (ref 70–99)
Glucose-Capillary: 133 mg/dL — ABNORMAL HIGH (ref 70–99)
Glucose-Capillary: 135 mg/dL — ABNORMAL HIGH (ref 70–99)
Glucose-Capillary: 141 mg/dL — ABNORMAL HIGH (ref 70–99)
Glucose-Capillary: 142 mg/dL — ABNORMAL HIGH (ref 70–99)
Glucose-Capillary: 144 mg/dL — ABNORMAL HIGH (ref 70–99)
Glucose-Capillary: 145 mg/dL — ABNORMAL HIGH (ref 70–99)
Glucose-Capillary: 146 mg/dL — ABNORMAL HIGH (ref 70–99)
Glucose-Capillary: 147 mg/dL — ABNORMAL HIGH (ref 70–99)
Glucose-Capillary: 148 mg/dL — ABNORMAL HIGH (ref 70–99)
Glucose-Capillary: 154 mg/dL — ABNORMAL HIGH (ref 70–99)
Glucose-Capillary: 155 mg/dL — ABNORMAL HIGH (ref 70–99)
Glucose-Capillary: 158 mg/dL — ABNORMAL HIGH (ref 70–99)
Glucose-Capillary: 164 mg/dL — ABNORMAL HIGH (ref 70–99)

## 2022-10-18 LAB — CBC
HCT: 26.8 % — ABNORMAL LOW (ref 36.0–46.0)
Hemoglobin: 8.7 g/dL — ABNORMAL LOW (ref 12.0–15.0)
MCH: 25.4 pg — ABNORMAL LOW (ref 26.0–34.0)
MCHC: 32.5 g/dL (ref 30.0–36.0)
MCV: 78.1 fL — ABNORMAL LOW (ref 80.0–100.0)
Platelets: 461 10*3/uL — ABNORMAL HIGH (ref 150–400)
RBC: 3.43 MIL/uL — ABNORMAL LOW (ref 3.87–5.11)
RDW: 14.9 % (ref 11.5–15.5)
WBC: 23.1 10*3/uL — ABNORMAL HIGH (ref 4.0–10.5)
nRBC: 0.9 % — ABNORMAL HIGH (ref 0.0–0.2)

## 2022-10-18 LAB — BASIC METABOLIC PANEL
Anion gap: 13 (ref 5–15)
BUN: 121 mg/dL — ABNORMAL HIGH (ref 6–20)
CO2: 21 mmol/L — ABNORMAL LOW (ref 22–32)
Calcium: 7.7 mg/dL — ABNORMAL LOW (ref 8.9–10.3)
Chloride: 91 mmol/L — ABNORMAL LOW (ref 98–111)
Creatinine, Ser: 3.15 mg/dL — ABNORMAL HIGH (ref 0.44–1.00)
GFR, Estimated: 18 mL/min — ABNORMAL LOW (ref >60)
Glucose, Bld: 142 mg/dL — ABNORMAL HIGH (ref 70–99)
Potassium: 5.8 mmol/L — ABNORMAL HIGH (ref 3.5–5.1)
Sodium: 125 mmol/L — ABNORMAL LOW (ref 135–145)

## 2022-10-18 LAB — POTASSIUM: Potassium: 6.2 mmol/L — ABNORMAL HIGH (ref 3.5–5.1)

## 2022-10-18 LAB — TRIGLYCERIDES: Triglycerides: 161 mg/dL — ABNORMAL HIGH (ref <150)

## 2022-10-18 LAB — HEPATITIS B SURFACE ANTIGEN: Hepatitis B Surface Ag: NONREACTIVE

## 2022-10-18 MED ORDER — SODIUM ZIRCONIUM CYCLOSILICATE 10 G PO PACK
10.0000 g | PACK | Freq: Once | ORAL | Status: AC
  Administered 2022-10-18: 10 g
  Filled 2022-10-18: qty 1

## 2022-10-18 MED ORDER — PRAZOSIN HCL 1 MG PO CAPS
1.0000 mg | ORAL_CAPSULE | Freq: Every day | ORAL | Status: DC
  Administered 2022-10-18 – 2022-10-26 (×8): 1 mg
  Filled 2022-10-18 (×10): qty 1

## 2022-10-18 MED ORDER — PREDNISONE 20 MG PO TABS
40.0000 mg | ORAL_TABLET | Freq: Every day | ORAL | Status: DC
  Administered 2022-10-18 – 2022-10-19 (×2): 40 mg
  Filled 2022-10-18 (×2): qty 2

## 2022-10-18 MED ORDER — CLONAZEPAM 0.25 MG PO TBDP
1.0000 mg | ORAL_TABLET | Freq: Two times a day (BID) | ORAL | Status: DC
  Administered 2022-10-18: 1 mg
  Filled 2022-10-18: qty 4

## 2022-10-18 MED ORDER — LACTULOSE 10 GM/15ML PO SOLN
30.0000 g | Freq: Three times a day (TID) | ORAL | Status: DC
  Administered 2022-10-18 – 2022-10-20 (×7): 30 g
  Filled 2022-10-18 (×7): qty 45

## 2022-10-18 MED ORDER — DEXTROSE IN LACTATED RINGERS 5 % IV SOLN: INTRAVENOUS | Status: DC

## 2022-10-18 MED ORDER — OXYCODONE HCL 5 MG PO TABS
10.0000 mg | ORAL_TABLET | Freq: Four times a day (QID) | ORAL | Status: DC
  Administered 2022-10-18 – 2022-10-21 (×11): 10 mg
  Filled 2022-10-18 (×11): qty 2

## 2022-10-18 MED ORDER — SODIUM ZIRCONIUM CYCLOSILICATE 10 G PO PACK: 10.0000 g | PACK | Freq: Once | ORAL | Status: DC

## 2022-10-18 MED ORDER — CLONAZEPAM 1 MG PO TABS
1.0000 mg | ORAL_TABLET | Freq: Two times a day (BID) | ORAL | Status: DC
  Administered 2022-10-18 – 2022-10-20 (×5): 1 mg
  Filled 2022-10-18 (×5): qty 1

## 2022-10-18 MED ORDER — LABETALOL HCL 200 MG PO TABS
400.0000 mg | ORAL_TABLET | Freq: Three times a day (TID) | ORAL | Status: DC
  Administered 2022-10-18 – 2022-10-20 (×9): 400 mg
  Filled 2022-10-18 (×11): qty 2

## 2022-10-18 MED ORDER — CLONIDINE HCL 0.2 MG PO TABS
0.3000 mg | ORAL_TABLET | Freq: Three times a day (TID) | ORAL | Status: DC
  Administered 2022-10-18 – 2022-10-27 (×26): 0.3 mg
  Filled 2022-10-18 (×27): qty 2

## 2022-10-18 NOTE — Progress Notes (Signed)
   10/18/22 1950  Vitals  BP (!) 150/63  BP Method  (AL)  During Treatment Monitoring  Intra-Hemodialysis Comments Tx completed  Post Treatment  Dialyzer Clearance Lightly streaked  Duration of HD Treatment -hour(s) 3 hour(s)  Hemodialysis Intake (mL) 0 mL  Liters Processed 45  Fluid Removed (mL) 3000 mL  Tolerated HD Treatment Yes  Post-Hemodialysis Comments goal met.  Hemodialysis Catheter Right Internal jugular Triple lumen Temporary (Non-Tunneled)  Placement Date/Time: 10/18/22 1220   Placed prior to admission: No  Time Out: Correct patient;Correct site;Correct procedure  Maximum sterile barrier precautions: Hand hygiene;Cap;Mask;Sterile gown;Sterile gloves;Large sterile sheet  Site Prep: Chlorh...  Site Condition No complications  Blue Lumen Status Heparin locked  Red Lumen Status Heparin locked  Catheter fill solution Heparin 1000 units/ml  Catheter fill volume (Arterial) 1.2 cc  Catheter fill volume (Venous) 1.2  Dressing Type Transparent;Gauze/Drain sponge  Dressing Status Clean, Dry, Intact  Interventions New dressing  Drainage Description None  Post treatment catheter status Capped and Clamped

## 2022-10-18 NOTE — Progress Notes (Signed)
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 PCP, PCR neg Remains VDRF 40% FIO2  Off clevipine, BPs stalbe on amlod 10/d, furosemide 80 IV TID,  On Tac 1 SL BID, notes reviewd; also on MMF and inc dose steroids solumedrol 40 BID Fiance at bedside, updated and discussed status Diffusely volume overloaded 3.1L net positive yesterday, > 22L positive from admit; 22K up as well  07/14 0701 - 07/15 0700 In: 4072.2 [I.V.:2160; NG/GT:1440; IV Piggyback:472.2] Out: 925 [Urine:925]  Filed Weights   10/16/22 0500 10/17/22 0500 10/18/22 0500  Weight: 130.6 kg 129.6 kg 136 kg    Scheduled Meds:  amLODipine  10 mg Per Tube Daily   arformoterol  15 mcg Nebulization BID   ARIPiprazole  30 mg Per Tube Daily   artificial tears  1 Application Both Eyes Q8H   atorvastatin  10 mg Per Tube Daily   buPROPion  225 mg Per Tube BID   docusate  100 mg Per Tube BID   famotidine  20 mg Per Tube Daily   fentaNYL (SUBLIMAZE) injection  25 mcg Intravenous Once   folic acid  1 mg Per Tube Daily   furosemide  80 mg Intravenous TID   guaiFENesin  400 mg Per Tube Q4H   heparin  5,000 Units Subcutaneous Q8H   hydrOXYzine  25 mg Per Tube QHS   lactulose  20 g Per Tube BID   loratadine  10 mg Per Tube Daily   methylPREDNISolone (SOLU-MEDROL) injection  40 mg Intravenous Q12H   montelukast  10 mg Per Tube QHS   multivitamin with minerals  1 tablet Per Tube Daily   mycophenolate  1,000 mg Per Tube BID   mouth rinse  15 mL Mouth Rinse Q2H   polyethylene glycol  17 g Per Tube BID   revefenacin  175 mcg Nebulization Daily   senna-docusate  1 tablet Per Tube BID   sodium bicarbonate  1,300 mg Per Tube BID   sulfamethoxazole-trimethoprim  1 tablet Per Tube Once per day on Monday Wednesday Friday   tacrolimus  1 mg  Sublingual BID   Continuous Infusions:  sodium chloride 10 mL/hr at 10/18/22 0700   clevidipine 12 mg/hr (10/18/22 0700)   feeding supplement (VITAL AF 1.2 CAL) 60 mL/hr at 10/18/22 0700   fentaNYL infusion INTRAVENOUS 400 mcg/hr (10/18/22 0700)   insulin 5.5 Units/hr (10/18/22 0700)   midazolam 12 mg/hr (10/18/22 0700)   piperacillin-tazobactam 3.375 g (10/18/22 0837)   PRN Meds:.sodium chloride, acetaminophen, albuterol, dextrose, fentaNYL, guaiFENesin, hydrALAZINE, labetalol, midazolam, midazolam, mouth rinse, oxyCODONE, vecuronium  Current Labs: reviewed today  Physical Exam:  Blood pressure (!) 128/55, pulse 96, temperature 97.9 F (36.6 C), resp. rate 18, height 5\' 6"  (1.676 m), weight 136 kg, SpO2 100%. Intubated, sedated  A AKI on CKD3a in setting of ddKT 2018 (BL SCr 1.4-1.6) baseline IS Tac/MMF/pred UOP falling off in past 24h despite in diuretic dosing Azotemia worsening, multifactorial including AKI, steroids K remains mild elevated Azotemia, as above Hyperkalemia, eprsistent, med mgmt thus far AHRF / Acute asthma exacerbation PCP excluded, off TMP/SMX; ABX per CCM Inbutaed 7/10 IS, using Tac SL 1mg  BID (50% dose reduction from baseline), on cellcept now (go back to myfortic when tol PO); higher dose steriod from pulm  indications AGMA, on PO NaHCO3; stable/improved HTN: off clevipres,BPs stable on amlodipine Severe hypervolemia / anasarca  P With progressive volume overload, azotemia, and hyperkalemia initiation HD today CCM to assist with temp HD line, no identifed vascular access on exam 2K, 3h, 250 BFR, 3L UF Patient status and daily plan discussed with CCM Olalere MD and primary RN  Still hopeful for GFR recovery in time and supportive care All questions answered for fiance Medication Issues; Preferred narcotic agents for pain control are hydromorphone, fentanyl, and methadone. Morphine should not be used.  Baclofen should be avoided Avoid oral sodium  phosphate and magnesium citrate based laxatives / bowel preps    Sabra Heck MD 10/18/2022, 8:44 AM  Recent Labs  Lab 10/14/22 1422 10/14/22 1431 10/15/22 0243 10/15/22 0550 10/16/22 0134 10/16/22 0355 10/17/22 0457 10/17/22 0540 10/18/22 0440  NA 131*   < > 130*   < > 126*   < > 126* 126* 125*  K 4.4   < > 4.9   < > 5.3*   < > 5.4* 5.5* 5.8*  CL 96*  --  93*  --  93*  --  92*  --  91*  CO2 21*  --  19*  --  21*  --  23  --  21*  GLUCOSE 202*  --  194*  --  227*  --  156*  --  142*  BUN 79*  --  78*  --  77*  --  88*  --  121*  CREATININE 2.20*  --  2.23*  --  2.14*  --  2.27*  --  3.15*  CALCIUM 7.6*  --  7.3*  --  7.2*  --  7.6*  --  7.7*  PHOS 6.1*  --  6.1*  --   --   --  6.1*  --   --    < > = values in this interval not displayed.   Recent Labs  Lab 10/11/22 0857 10/12/22 0318 10/13/22 0123 10/15/22 0243 10/15/22 0550 10/16/22 0134 10/16/22 0355 10/17/22 0457 10/17/22 0540 10/18/22 0440  WBC 26.9* 25.3*   < > 13.9*  --  10.4  --  15.1*  --  23.1*  NEUTROABS 23.9* 22.5*  --  12.3*  --   --   --   --   --   --   HGB 10.3* 10.5*   < > 8.6*   < > 8.4*   < > 8.8* 9.2* 8.7*  HCT 31.9* 32.5*   < > 27.0*   < > 25.5*   < > 27.1* 27.0* 26.8*  MCV 81.4 80.8   < > 77.8*  --  78.7*  --  79.5*  --  78.1*  PLT 411* 397   < > 454*  --  422*  --  466*  --  461*   < > = values in this interval not displayed.

## 2022-10-18 NOTE — Progress Notes (Signed)
Nutrition Follow-up  DOCUMENTATION CODES:  Obesity unspecified  INTERVENTION:  Tube feeding via OGT: Advance Vital AF 1.2 by 10 mL every 4 hours to a goal rate of 60 ml/h (1440 ml per day) Provides 1728 kcal, 108 gm protein, 1168 ml free water daily  NUTRITION DIAGNOSIS:  Inadequate oral intake related to inability to eat as evidenced by NPO status. - Ongoing   GOAL:  Provide needs based on ASPEN/SCCM guidelines  - Ongoing   MONITOR:  TF tolerance, I & O's, Vent status, Labs  REASON FOR ASSESSMENT:  Rounds, Ventilator, Consult Enteral/tube feeding initiation and management  ASSESSMENT:  Pt with hx of prior ESRD on HD now with renal transplant 2018, DM type 2, HTN, HLD, CAD, gastroparesis, and GERD presented to ED with weakness and SOB. Imaging suggestive of pneumonia.  7/6 - admitted 7/9 - worsening SOB, transferred to ICU 7/10 - intubated, bronchoscopy; trickle TF started 7/12 - TF started advancing to goal 7/15 - TF held and OGT hooked to suction after TF noted in oral cavity, HD initiated  Patient remains intubated on ventilator support. Discussed in rounds, TF currently on hold and OGT to suction after TF noted in oral cavity 7/15. Almost 2L out since being hooked to suction. Imaging suggestive of ileus, started on reglan and pt with BM now and requiring a fecal management system. Per MD, will continue to hold feeds today and possible restart tomorrow.    Pt underwent initiation of HD yesterday. 3L fluid removed. Tolerated well. Planning to repeat today.   MV: 12.5 L/min Temp (24hrs), Avg:97.8 F (36.6 C), Min:96.8 F (36 C), Max:98.4 F (36.9 C)   Intake/Output Summary (Last 24 hours) at 10/19/2022 1442 Last data filed at 10/19/2022 1400 Gross per 24 hour  Intake 3057.04 ml  Output 5225 ml  Net -2167.96 ml  Net IO Since Admission: 21,109.92 mL [10/19/22 1442]  Nutritionally Relevant Medications: Scheduled Meds:  atorvastatin  10 mg Per Tube Daily   famotidine   20 mg Per Tube Daily   folic acid  1 mg Per Tube Daily   lactulose  30 g Per Tube TID   metoCLOPramide (REGLAN) injection  10 mg Intravenous Q6H   multivitamin with minerals  1 tablet Per Tube Daily   polyethylene glycol  17 g Per Tube BID   prazosin  1 mg Per Tube QHS   senna-docusate  1 tablet Per Tube BID   sulfamethoxazole-trimethoprim  1 tablet Per Tube Once per day on Monday Wednesday Friday   tacrolimus  1 mg Sublingual BID   Continuous Infusions:  clevidipine 6 mg/hr (10/19/22 1200)   dextrose 5% lactated ringers 50 mL/hr at 10/19/22 1200   feeding supplement (VITAL AF 1.2 CAL) Stopped (10/18/22 2300)   insulin 1.8 Units/hr (10/19/22 1200)   Labs Reviewed: Na 129, chloride 92 K 5.2 BUN 111, creatinine 3.13 Phosphorus 8.2 CBG ranges from 115-159 mg/dL over the last 24 hours HgbA1c 6.7% (5/24)  Diet Order:   Diet Order             Diet NPO time specified  Diet effective now                   EDUCATION NEEDS:   Not appropriate for education at this time  Skin:  Skin Assessment: Reviewed RN Assessment  Last BM:  7/16 - type 7, fecal containment device  Height:  Ht Readings from Last 1 Encounters:  10/13/22 5\' 6"  (1.676 m)   Weight:  Wt Readings from Last 1 Encounters:  10/19/22 130.1 kg   Ideal Body Weight:  59.1 kg  BMI:  Body mass index is 46.29 kg/m.  Estimated Nutritional Needs:  Kcal:  1800-2100kcal/d Protein:  100-120g/d Fluid:  1.8L/d   Greig Castilla, RD, LDN Clinical Dietitian RD pager # available in AMION  After hours/weekend pager # available in [facility]

## 2022-10-18 NOTE — Progress Notes (Signed)
eLink Physician-Brief Progress Note Patient Name: Linda Ellison DOB: 03-Jul-1975 MRN: [id number]   Date of Service  10/18/2022  HPI/Events of Note  On going hyperkalemia Nephrology on board. AKI/CKD stage IIIa following DDKT- Baseline in the 1.4-1.6 range it appears. Admission Cr 5 presumably due to ischemic ATN in setting of poor po intake and diarrhea for the past 5-6 days. Scr has improved to low 2s and stable. 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   eICU Interventions  - on lasix 80 q8 hrly. To give lasix now. - lokelma 10 mg ordered.  - Nephrology to follow up - follow Potassium at 10 AM.   Discussed with RN.     Intervention Category Intermediate Interventions: Electrolyte abnormality - evaluation and management  Ranee Gosselin 10/18/2022, 6:20 AM

## 2022-10-18 NOTE — Progress Notes (Signed)
eLink Physician-Brief Progress Note Patient Name: SHAQUEL JOSEPHSON DOB: [birth year] MRN: [id number]   Date of Service  10/18/2022  HPI/Events of Note  Tense abdomen.  Tube feeds coming out of mouth per nurse.  Evidence of ileus on abd film.  eICU Interventions  Tube feeds held Glucose source started     Intervention Category Intermediate Interventions: Other:  Henry Russel, Demetrius Charity 10/18/2022, 11:50 PM

## 2022-10-18 NOTE — Progress Notes (Addendum)
NAME:  Linda Ellison, MRN:  [id number], DOB:  31-Dec-1975, LOS: 8 ADMISSION DATE:  10/09/2022, CONSULTATION DATE: 10/12/2022 REFERRING MD: Triad, CHIEF COMPLAINT: Respiratory distress  History of Present Illness:  47 year old female with extensive past medical history is well-documented below who was admitted 2 days prior to this consult with multifocal pneumonia.  She has had increasing work of breathing with a respiratory rate of 46.  She had a CT scan that demonstrates bilateral multifocal pneumonia, elevated BNP, and increased work of breathing that will require her being transferred to the intensive care unit.  She has noted a nonproductive cough prior to admission.  Increasing shortness of breath prior to admission.  And increasing lower extremity edema.  She is status post cadaveric renal transplant and she is followed by nephrology.  Currently she is hypokalemic with creatinine in the 2.2 range.  Due to her increasing work of breathing impending respiratory failure she will be transferred to intensive care unit for further evaluation and treatment.  She has already been started on aggressive diuresis and steroids and empirical antimicrobial therapy.  Pertinent  Medical History   Past Medical History:  Diagnosis Date   Allergy    Anemia associated with chronic renal failure    Anxiety    Asthma    Atypical chest pain    long-standing -- normal cardio cath 06-27-2012 and normal nuclear stress test 06-25-2016   AV (arteriovenous fistula) (HCC)    for dialysis-- currently located left radiocephalic   CAD (coronary artery disease) cardiologist-- dr Sharol Roussel [facility] cardiology in [place])   a. False positive stress echo 06/2012 at [facility] - cath with no obstructive CAD at [facility] (mild luminal irregularities in LAD, moderate diffuse disease in distal RCA).   Depression    Elevated lipids    ESRD on hemodialysis [facility]) NEPHROLOGIST-  DR MATTINGLY   started dialysis 01/29/11:  Foye Spurling [facility] on MWF   Gastroparesis    GERD (gastroesophageal reflux disease)    History of pneumonia    HCAP 04/ 2017   Hyperlipidemia    Hyperthyroidism    Menorrhagia    Other secondary hypertension    associated to diabetes -- followed by cardiologist ( dr Sharol Roussel)     Peripheral neuropathy    PONV (postoperative nausea and vomiting)    Pre-transplant evaluation for kidney transplant    [facility]   Renal failure syndrome 03/05/2022   Secondary hyperparathyroidism of renal origin [facility])    Sleep apnea    Type 2 diabetes mellitus, with long-term current use of insulin (HCC)    dx 1985     Significant Hospital Events: Including procedures, antibiotic start and stop dates in addition to other pertinent events   07/09 Transferred to ICU  07/10: Intubated  7/13-improving O2 requirement 7/15 worsening renal function, fluid overloaded  Interim History / Subjective:  Due to ventilator dyssynchrony requiring pushes of vecuronium-last dose 7/14 at 0008 Uncontrolled hypertension on Cleviprex  Objective   Blood pressure (!) 128/55, pulse 96, temperature 98.2 F (36.8 C), resp. rate (!) 21, height 5\' 6"  (1.676 m), weight 136 kg, SpO2 100%.      Vent Mode: PRVC FiO2 (%):  [40 %] 40 % Set Rate:  [24 bmp] 24 bmp Vt Set:  [470 mL] 470 mL PEEP:  [5 cmH20] 5 cmH20 Plateau Pressure:  [15 cmH20-30 cmH20] 27 cmH20   Intake/Output Summary (Last 24 hours) at 10/18/2022 0815 Last data filed at 10/18/2022  0700 Gross per 24 hour  Intake 3599.42 ml  Output 700 ml  Net 2899.42 ml   Filed Weights   10/16/22 0500 10/17/22 0500 10/18/22 0500  Weight: 130.6 kg 129.6 kg 136 kg    Examination: General: Middle-age, sedated, chronically ill-appearing HENT: Moist oral mucosa, endotracheal tube in place Lungs: Coarse breath sounds, bilateral rails Cardiovascular: S1-S2 appreciated Abdomen: Abdomen is soft, bowel sounds  appreciated Extremities: Anasarca Neuro: Intubated and sedated  Fluid status has not improved significantly Worsening renal parameters  On a PEEP of 10, PRVC, rate of 40, tidal volume 470  Chest x-ray looks better than previous-reviewed by myself  Labs: Labs reviewed: ABG 7.32/47/70  White count 23.1, hemoglobin 8.7, hematocrit 26.8 BAL culture-no organisms PJP PCR negative  Resolved Hospital Problem list     Assessment & Plan:   Acute hypoxemic respiratory failure secondary multifocal pneumonia ARDS Concern for PJP pneumonia-negative testing -Oxygen requirement has remained stable -Chest x-ray improving -Now on Bactrim for PJP prophylaxis -Day 7 Zosyn- discontinue today  Leukocytosis likely related to Solu-Medrol -Solu-Medrol doses decreased to 40 every 12 -Will decrease to 40 daily -May switch to prednisone 10 mg daily, was on 5 mg at home, plan will be to decrease after 2 days to 5 mg daily  Continue mechanical ventilation -Target TVol 6-8cc/kgIBW -Target Plateau Pressure < 30cm H20 -Target driving pressure less than 15 cm of water -Target PaO2 55-65: titrate PEEP/FiO2 per protocol -Ventilator associated pneumonia prevention protocol  Sedation and agitation management -On high doses of fentanyl and Versed -Start oral oxycodone and Klonopin  -Limit paralytics as best as possible as long as we do not have significant ventilator dyssynchrony  History of renal transplant secondary to acute renal failure S/p cadaveric renal transplantation on immunosuppressants Tacrolimus toxicity Acute kidney injury on chronic kidney disease stage IIIa Discussed with renal this morning -Secondary to worsening renal parameters, fluid overloaded and not diuresing well -Will benefit from CRRT -Will place dialysis catheter, discussed with significant other at bedside -Continue to avoid nephrotoxic medications -Maintain renal perfusion  Type 1 diabetes -On insulin  infusion  Hypertension -Was on amlodipine, resumed -On Cleviprex -Try to wean off -add home BP medications-clonidine and labetalol  Anemia of chronic illness -Continue to trend  Respiratory status appears stable Renal parameters unfortunately was  Risk of decompensation remains significant  best Practice (right click and "Reselect all SmartList Selections" daily)   Diet/type: tubefeeds DVT prophylaxis: prophylactic heparin  GI prophylaxis: PPI Lines: N/A Foley:  N/A Code Status:  full code Last date of multidisciplinary goals of care discussion [discussed with significant other at bedside 7/15, 8:40 AM]  Labs   CBC: Recent Labs  Lab 10/11/22 0857 10/12/22 0318 10/13/22 0123 10/14/22 0221 10/14/22 0328 10/15/22 0243 10/15/22 0550 10/16/22 0134 10/16/22 0355 10/16/22 1558 10/17/22 0457 10/17/22 0540 10/18/22 0440  WBC 26.9* 25.3*   < > 25.2*  --  13.9*  --  10.4  --   --  15.1*  --  23.1*  NEUTROABS 23.9* 22.5*  --   --   --  12.3*  --   --   --   --   --   --   --   HGB 10.3* 10.5*   < > 9.3*   < > 8.6*   < > 8.4* 9.2* 9.9* 8.8* 9.2* 8.7*  HCT 31.9* 32.5*   < > 29.2*   < > 27.0*   < > 25.5* 27.0* 29.0* 27.1* 27.0* 26.8*  MCV 81.4 80.8   < >  81.1  --  77.8*  --  78.7*  --   --  79.5*  --  78.1*  PLT 411* 397   < > 483*  --  454*  --  422*  --   --  466*  --  461*   < > = values in this interval not displayed.    Basic Metabolic Panel: Recent Labs  Lab 10/13/22 1600 10/14/22 0014 10/14/22 0221 10/14/22 0328 10/14/22 1422 10/14/22 1431 10/15/22 0243 10/15/22 0550 10/16/22 0134 10/16/22 0355 10/16/22 1558 10/17/22 0457 10/17/22 0540 10/18/22 0440  NA  --    < > 130*   < > 131*   < > 130*   < > 126* 128* 128* 126* 126* 125*  K  --    < > 4.6   < > 4.4   < > 4.9   < > 5.3* 5.4* 5.4* 5.4* 5.5* 5.8*  CL  --   --  97*  --  96*  --  93*  --  93*  --   --  92*  --  91*  CO2  --   --  20*  --  21*  --  19*  --  21*  --   --  23  --  21*  GLUCOSE  --   --   236*  --  202*  --  194*  --  227*  --   --  156*  --  142*  BUN  --   --  84*  --  79*  --  78*  --  77*  --   --  88*  --  121*  CREATININE  --   --  2.23*  --  2.20*  --  2.23*  --  2.14*  --   --  2.27*  --  3.15*  CALCIUM  --   --  7.3*  --  7.6*  --  7.3*  --  7.2*  --   --  7.6*  --  7.7*  MG 1.9  --  2.0  --  1.7  --  1.9  --   --   --   --  2.4  --   --   PHOS 6.1*  --  6.0*  --  6.1*  --  6.1*  --   --   --   --  6.1*  --   --    < > = values in this interval not displayed.   GFR: Estimated Creatinine Clearance: 31.7 mL/min (A) (by C-G formula based on SCr of 3.15 mg/dL (H)). Recent Labs  Lab 10/13/22 0123 10/14/22 0221 10/14/22 1422 10/14/22 1707 10/15/22 0243 10/16/22 0134 10/17/22 0457 10/18/22 0440  PROCALCITON 0.51  --   --   --   --   --   --   --   WBC 33.0*   < >  --   --  13.9* 10.4 15.1* 23.1*  LATICACIDVEN  --   --  1.4 1.1  --   --   --   --    < > = values in this interval not displayed.    Liver Function Tests: Recent Labs  Lab 10/11/22 0857 10/12/22 0318 10/13/22 0129  ALBUMIN 2.6* 2.6* 2.5*   No results for input(s): "LIPASE", "AMYLASE" in the last 168 hours. No results for input(s): "AMMONIA" in the last 168 hours.  ABG    Component Value Date/Time   PHART 7.323 (L) 10/17/2022  0540   PCO2ART 47.4 10/17/2022 0540   PO2ART 70 (L) 10/17/2022 0540   HCO3 24.7 10/17/2022 0540   TCO2 26 10/17/2022 0540   ACIDBASEDEF 2.0 10/17/2022 0540   O2SAT 93 10/17/2022 0540     Coagulation Profile: No results for input(s): "INR", "PROTIME" in the last 168 hours.  Cardiac Enzymes: No results for input(s): "CKTOTAL", "CKMB", "CKMBINDEX", "TROPONINI" in the last 168 hours.  HbA1C: Hgb A1c MFr Bld  Date/Time Value Ref Range Status  08/27/2022 03:43 AM 6.7 (H) 4.8 - 5.6 % Final    Comment:    (NOTE)         Prediabetes: 5.7 - 6.4         Diabetes: >6.4         Glycemic control for adults with diabetes: <7.0   11/21/2017 06:49 PM 9.9 (H) 4.8 - 5.6 %  Final    Comment:    (NOTE) Pre diabetes:          5.7%-6.4% Diabetes:              >6.4% Glycemic control for   <7.0% adults with diabetes     CBG: Recent Labs  Lab 10/18/22 0152 10/18/22 0245 10/18/22 0445 10/18/22 0652 10/18/22 0738  GLUCAP 155* 158* 147* 144* 142*    Review of Systems:   10 point review of system taken, please see HPI for positives and negatives.   Past Medical History:  She,  has a past medical history of Allergy, Anemia associated with chronic renal failure, Anxiety, Asthma, Atypical chest pain, AV (arteriovenous fistula) (HCC), CAD (coronary artery disease) (cardiologist-- dr Sharol Roussel [facility] cardiology in [place])), Depression, Elevated lipids, ESRD on hemodialysis (HCC) (NEPHROLOGIST-  DR MATTINGLY), Gastroparesis, GERD (gastroesophageal reflux disease), History of pneumonia, Hyperlipidemia, Hyperthyroidism, Menorrhagia, Other secondary hypertension, Peripheral neuropathy, PONV (postoperative nausea and vomiting), Pre-transplant evaluation for kidney transplant, Renal failure syndrome (03/05/2022), Secondary hyperparathyroidism of renal origin [facility]), Sleep apnea, and Type 2 diabetes mellitus, with long-term current use of insulin (HCC).   Surgical History:   Past Surgical History:  Procedure Laterality Date   AV FISTULA PLACEMENT  08/2010   Left radiocephalic AVF   AV FISTULA PLACEMENT Right 05/07/2014   Procedure: ARTERIOVENOUS (AV) FISTULA CREATION;  Surgeon: Sherren Kerns, MD;  Location: [facility] OR;  Service: Vascular;  Laterality: Right;   AV FISTULA PLACEMENT Right 07/02/2014   Procedure: INSERTION OF ARTERIOVENOUS (AV) GORE-TEX GRAFT ARM;  Surgeon: Sherren Kerns, MD;  Location: [facility] OR;  Service: Vascular;  Laterality: Right;   CARDIOVASCULAR STRESS TEST  06/25/2016   [facility]   normal nuclear perfusion study w/ no ischemia/  normal LV function and wall motion , ef 51%   CESAREAN SECTION  03/22/2001   w/ Bilateral Tubal Ligation    COLONOSCOPY     COLONOSCOPY WITH ESOPHAGOGASTRODUODENOSCOPY (EGD)  10/19/2011   DILATION AND CURETTAGE OF UTERUS  05/12/2000   w/ suction for missed ab   DOBUTAMINE STRESS ECHO  06/04/2016    [facility]   normal stress echo w/ no chest pain or ischemia/  normal LV function and wall motion , stress ef 60-65%   ESOPHAGOGASTRODUODENOSCOPY  10/19/2011   Dr. Stan Head   FEMUR IM NAIL Left child   removed   FOOT SURGERY Left 2014 approx.   HYSTEROSCOPY WITH NOVASURE N/A 08/19/2016   Procedure: HYSTEROSCOPY WITH NOVASURE;  Surgeon: Huel Cote, MD;  Location[facility];  Service: Gynecology;  Laterality: N/A;   KIDNEY TRANSPLANT  09/18/2016   LAPAROSCOPIC CHOLECYSTECTOMY  1994   LEFT HEART CATHETERIZATION WITH CORONARY ANGIOGRAM N/A 06/27/2012   Procedure: LEFT HEART CATHETERIZATION WITH CORONARY ANGIOGRAM;  Surgeon: Laurey Morale, MD;  Location: [facility] CATH LAB;  Service: Cardiovascular;  Laterality: N/A; No ostructive CAD, normal LVF, ef 55-60% (mild LAD luminal irregularities, moderate dRCA diffuse disease)   LIGATION GORETEX FISTULA  01/04/11   Left AVF   LIGATION OF ARTERIOVENOUS  FISTULA Left 08/21/2014   Procedure: LIGATION OF ARTERIOVENOUS  FISTULA;  Surgeon: Annice Needy, MD;  Location: [facility] ORS;  Service: Vascular;  Laterality: Left;   LIGATION OF ARTERIOVENOUS  FISTULA Left 06/29/2017   Procedure: LIGATION OF ARTERIOVENOUS  FISTULA ( RADIOCEPHALIC );  Surgeon: Annice Needy, MD;  Location: [facility] ORS;  Service: Vascular;  Laterality: Left;   NEPHRECTOMY TRANSPLANTED ORGAN     PERIPHERAL VASCULAR CATHETERIZATION Left 08/08/2014   Procedure: Upper Extremity Angiography;  Surgeon: Annice Needy, MD;  Location: [facility] INVASIVE CV LAB;  Service: Cardiovascular;  Laterality: Left;   PERIPHERAL VASCULAR CATHETERIZATION Left 08/08/2014   Procedure: Upper Extremity Intervention;  Surgeon: Annice Needy, MD;  Location: [facility] INVASIVE CV LAB;  Service: Cardiovascular;  Laterality: Left;    PERIPHERAL VASCULAR CATHETERIZATION Left 03/08/2016   Procedure: A/V Fistulagram;  Surgeon: Annice Needy, MD;  Location: [facility] INVASIVE CV LAB;  Service: Cardiovascular;  Laterality: Left;   POLYPECTOMY     THROMBECTOMY AND REVISION OF ARTERIOVENTOUS (AV) GORETEX  GRAFT Right 07/16/2014   Procedure: THROMBECTOMY  Right  arm  ARTERIOVENOUS  GORETEX  GRAFT;  Surgeon: Sherren Kerns, MD;  Location: [facility] OR;  Service: Vascular;  Laterality: Right;   TRANSTHORACIC ECHOCARDIOGRAM  06/04/2016   mild concentric LVH, ef 55-60%,  mild TR,  borderline LAE,  trivial MR and PR     Social History:   reports that she has never smoked. She has never used smokeless tobacco. She reports that she does not drink alcohol and does not use drugs.   Family History:  Her family history includes Arthritis in her brother; Diabetes in her father, maternal aunt, and paternal aunt; Heart disease in her brother, mother, and paternal aunt; Hypertension in her father, maternal aunt, and paternal aunt; Kidney disease in her maternal grandmother and mother; Kidney failure in her paternal aunt and paternal uncle. There is no history of Colon cancer, Colon polyps, Crohn's disease, Esophageal cancer, Rectal cancer, Stomach cancer, or Ulcerative colitis.   Allergies Allergies  Allergen Reactions   Ciprofloxacin Hives, Itching and Nausea And Vomiting   Fluocinolone Itching, Nausea And Vomiting and Other (See Comments)   Hydromorphone Hives   Strawberry Extract Itching and Other (See Comments)    Pt states that this medication makes her tongue raw.   No reaction   Hydromorphone Hcl Hives   Phenytoin Sodium Extended Itching   Adhesive [Tape] Itching and Rash   Chlorhexidine Gluconate Itching   Clindamycin Diarrhea, Nausea And Vomiting and Rash   Shrimp [Shellfish Allergy] Rash    The patient is critically ill with multiple organ systems failure and requires high complexity decision making for assessment and support, frequent  evaluation and titration of therapies, application of advanced monitoring technologies and extensive interpretation of multiple databases. Critical Care Time devoted to patient care services described in this note independent of APP/resident time (if applicable)  is 45 minutes.   Virl Diamond MD [facility] Personal pager: See Amion If unanswered, please page CCM On-call: #[phone]

## 2022-10-18 NOTE — Procedures (Addendum)
Central Venous Catheter Insertion Procedure Note  SIRA ADSIT  [id number]  08/03/1975  Date:10/18/22  Time:12:28 PM   Provider Performing:Amar Allena Katz   Procedure: Insertion of Non-tunneled Central Venous [id number]) with US guidance ([id number])   Indication(s) Difficult access and Hemodialysis  Consent Risks of the procedure as well as the alternatives and risks of each were explained to the patient and/or caregiver.  Consent for the procedure was obtained and is signed in the bedside chart  Anesthesia Topical only with 1% lidocaine   Timeout Verified patient identification, verified procedure, site/side was marked, verified correct patient position, special equipment/implants available, medications/allergies/relevant history reviewed, required imaging and test results available.  Sterile Technique Maximal sterile technique including full sterile barrier drape, hand hygiene, sterile gown, sterile gloves, mask, hair covering, sterile ultrasound probe cover (if used).  Procedure Description Area of catheter insertion was cleaned with chlorhexidine and draped in sterile fashion.  With real-time ultrasound guidance a HD catheter was placed into the right internal jugular vein. Nonpulsatile blood flow and easy flushing noted in all ports.  The catheter was sutured in place and sterile dressing applied.  Complications/Tolerance None; patient tolerated the procedure well. Chest X-ray is ordered to verify placement for internal jugular or subclavian cannulation.   Chest x-ray is not ordered for femoral cannulation.  EBL Minimal  Specimen(s) None

## 2022-10-19 DIAGNOSIS — J189 Pneumonia, unspecified organism: Secondary | ICD-10-CM | POA: Diagnosis not present

## 2022-10-19 LAB — RENAL FUNCTION PANEL
Albumin: 2.6 g/dL — ABNORMAL LOW (ref 3.5–5.0)
Anion gap: 14 (ref 5–15)
BUN: 111 mg/dL — ABNORMAL HIGH (ref 6–20)
CO2: 23 mmol/L (ref 22–32)
Calcium: 8.3 mg/dL — ABNORMAL LOW (ref 8.9–10.3)
Chloride: 92 mmol/L — ABNORMAL LOW (ref 98–111)
Creatinine, Ser: 3.13 mg/dL — ABNORMAL HIGH (ref 0.44–1.00)
GFR, Estimated: 18 mL/min — ABNORMAL LOW (ref >60)
Glucose, Bld: 132 mg/dL — ABNORMAL HIGH (ref 70–99)
Phosphorus: 8.2 mg/dL — ABNORMAL HIGH (ref 2.5–4.6)
Potassium: 5.2 mmol/L — ABNORMAL HIGH (ref 3.5–5.1)
Sodium: 129 mmol/L — ABNORMAL LOW (ref 135–145)

## 2022-10-19 LAB — GLUCOSE, CAPILLARY
Glucose-Capillary: 115 mg/dL — ABNORMAL HIGH (ref 70–99)
Glucose-Capillary: 118 mg/dL — ABNORMAL HIGH (ref 70–99)
Glucose-Capillary: 121 mg/dL — ABNORMAL HIGH (ref 70–99)
Glucose-Capillary: 132 mg/dL — ABNORMAL HIGH (ref 70–99)
Glucose-Capillary: 136 mg/dL — ABNORMAL HIGH (ref 70–99)
Glucose-Capillary: 145 mg/dL — ABNORMAL HIGH (ref 70–99)
Glucose-Capillary: 151 mg/dL — ABNORMAL HIGH (ref 70–99)
Glucose-Capillary: 151 mg/dL — ABNORMAL HIGH (ref 70–99)
Glucose-Capillary: 151 mg/dL — ABNORMAL HIGH (ref 70–99)
Glucose-Capillary: 152 mg/dL — ABNORMAL HIGH (ref 70–99)
Glucose-Capillary: 156 mg/dL — ABNORMAL HIGH (ref 70–99)
Glucose-Capillary: 159 mg/dL — ABNORMAL HIGH (ref 70–99)
Glucose-Capillary: 159 mg/dL — ABNORMAL HIGH (ref 70–99)
Glucose-Capillary: 162 mg/dL — ABNORMAL HIGH (ref 70–99)
Glucose-Capillary: 162 mg/dL — ABNORMAL HIGH (ref 70–99)
Glucose-Capillary: 177 mg/dL — ABNORMAL HIGH (ref 70–99)
Glucose-Capillary: 181 mg/dL — ABNORMAL HIGH (ref 70–99)
Glucose-Capillary: 182 mg/dL — ABNORMAL HIGH (ref 70–99)
Glucose-Capillary: 183 mg/dL — ABNORMAL HIGH (ref 70–99)

## 2022-10-19 LAB — CBC
HCT: 27.6 % — ABNORMAL LOW (ref 36.0–46.0)
Hemoglobin: 9 g/dL — ABNORMAL LOW (ref 12.0–15.0)
MCH: 25.4 pg — ABNORMAL LOW (ref 26.0–34.0)
MCHC: 32.6 g/dL (ref 30.0–36.0)
MCV: 78 fL — ABNORMAL LOW (ref 80.0–100.0)
Platelets: 444 10*3/uL — ABNORMAL HIGH (ref 150–400)
RBC: 3.54 MIL/uL — ABNORMAL LOW (ref 3.87–5.11)
RDW: 14.9 % (ref 11.5–15.5)
WBC: 23.1 10*3/uL — ABNORMAL HIGH (ref 4.0–10.5)
nRBC: 2.4 % — ABNORMAL HIGH (ref 0.0–0.2)

## 2022-10-19 LAB — HEPATITIS B SURFACE ANTIBODY, QUANTITATIVE: Hep B S AB Quant (Post): <3.5 m[IU]/mL — ABNORMAL LOW

## 2022-10-19 LAB — TROPONIN I (HIGH SENSITIVITY): Troponin I (High Sensitivity): 29 ng/L — ABNORMAL HIGH (ref <18)

## 2022-10-19 MED ORDER — PREDNISONE 10 MG PO TABS
5.0000 mg | ORAL_TABLET | Freq: Every day | ORAL | Status: DC
  Administered 2022-10-20 – 2022-10-27 (×8): 5 mg
  Filled 2022-10-19 (×8): qty 1

## 2022-10-19 MED ORDER — HYDRALAZINE HCL 50 MG PO TABS
50.0000 mg | ORAL_TABLET | Freq: Three times a day (TID) | ORAL | Status: DC
  Administered 2022-10-19 – 2022-10-21 (×7): 50 mg
  Filled 2022-10-19 (×8): qty 1

## 2022-10-19 MED ORDER — METOCLOPRAMIDE HCL 5 MG/ML IJ SOLN
10.0000 mg | Freq: Four times a day (QID) | INTRAMUSCULAR | Status: DC
  Administered 2022-10-19 – 2022-10-25 (×24): 10 mg via INTRAVENOUS
  Filled 2022-10-19 (×23): qty 2

## 2022-10-19 NOTE — Progress Notes (Addendum)
eLink Physician-Brief Progress Note Patient Name: Linda Ellison DOB: [birth year] MRN: [id number]   Date of Service  10/19/2022  HPI/Events of Note  Notified of ST elevation alert on the monitor.  EKG done showing T wave inversions in the inferior and lateral leads but no ST elevation.   eICU Interventions  Check troponin now.  Get echo in the morning.  Will start on heparin gtt if troponin is elevated.     Intervention Category Intermediate Interventions: Diagnostic test evaluation  Larinda Buttery 10/19/2022, 9:58 PM  10:58 PM Troponin at 29.   Plan> Continue to trend for now.   3:24 AM Troponin downtrending from 29 --> 26.  Plan> Hold off on anticoagulation.

## 2022-10-19 NOTE — Progress Notes (Signed)
NAME:  Linda Ellison, MRN:  [id number], DOB:  [birth year], LOS: 9 ADMISSION DATE:  10/09/2022, CONSULTATION DATE: 10/12/2022 REFERRING MD: Triad, CHIEF COMPLAINT: Respiratory distress  History of Present Illness:  47 year old female with extensive past medical history is well-documented below who was admitted 2 days prior to this consult with multifocal pneumonia.  She has had increasing work of breathing with a respiratory rate of 46.  She had a CT scan that demonstrates bilateral multifocal pneumonia, elevated BNP, and increased work of breathing that will require her being transferred to the intensive care unit.  She has noted a nonproductive cough prior to admission.  Increasing shortness of breath prior to admission.  And increasing lower extremity edema.  She is status post cadaveric renal transplant and she is followed by nephrology.  Currently she is hypokalemic with creatinine in the 2.2 range.  Due to her increasing work of breathing impending respiratory failure she will be transferred to intensive care unit for further evaluation and treatment.  She has already been started on aggressive diuresis and steroids and empirical antimicrobial therapy.  Pertinent  Medical History   Past Medical History:  Diagnosis Date   Allergy    Anemia associated with chronic renal failure    Anxiety    Asthma    Atypical chest pain    long-standing -- normal cardio cath 06-27-2012 and normal nuclear stress test 06-25-2016   AV (arteriovenous fistula) (HCC)    for dialysis-- currently located left radiocephalic   CAD (coronary artery disease) cardiologist-- dr Sharol Roussel [facility]- [facility] in [place])   a. False positive stress echo 06/2012 at [facility] - cath with no obstructive CAD at [facility] (mild luminal irregularities in LAD, moderate diffuse disease in distal RCA).   Depression    Elevated lipids    ESRD on hemodialysis [facility]) NEPHROLOGIST-  DR MATTINGLY   started dialysis 01/29/11:  [facility] on MWF   Gastroparesis    GERD (gastroesophageal reflux disease)    History of pneumonia    HCAP 04/ 2017   Hyperlipidemia    Hyperthyroidism    Menorrhagia    Other secondary hypertension    associated to diabetes -- followed by cardiologist ( dr Sharol Roussel)     Peripheral neuropathy    PONV (postoperative nausea and vomiting)    Pre-transplant evaluation for kidney transplant    [facility]   Renal failure syndrome 03/05/2022   Secondary hyperparathyroidism of renal origin [facility])    Sleep apnea    Type 2 diabetes mellitus, with long-term current use of insulin (HCC)    dx 1985   Significant Hospital Events: Including procedures, antibiotic start and stop dates in addition to other pertinent events   07/09 Transferred to ICU  07/10: Intubated  7/13-improving O2 requirement 7/15 worsening renal function, fluid overloaded 7/15-tolerated hemodialysis   Interim History / Subjective:  Some ileus -Will start Reglan She is arousable  Objective   Blood pressure (!) 166/71, pulse 95, temperature 97.7 F (36.5 C), resp. rate (!) 22, height 5\' 6"  (1.676 m), weight 130.1 kg, SpO2 99%.      Vent Mode: PRVC FiO2 (%):  [40 %] 40 % Set Rate:  [24 bmp] 24 bmp Vt Set:  [470 mL] 470 mL PEEP:  [5 cmH20] 5 cmH20 Plateau Pressure:  [15 cmH20-31 cmH20] 21 cmH20   Intake/Output Summary (Last 24 hours) at 10/19/2022 0925 Last data filed at 10/19/2022 0800 Gross per 24 hour  Intake  2667.68 ml  Output 5205 ml  Net -2537.32 ml   Filed Weights   10/18/22 1703 10/18/22 1952 10/19/22 0447  Weight: 136 kg 133 kg 130.1 kg    Examination: General: Middle-age, sedated Arousable to name calling HENT: Moist oral mucosa, endotracheal tube in place Lungs:  coarse breath sounds Cardiovascular: S1-S2 appreciated Abdomen: Soft, bowel sounds hypoactive Extremities: Anasarca Neuro: Intubated and sedated  BUN 111, creatinine 3.13 WBC  23.1 hematocrit 27.6  BAL culture-no organisms PJP PCR negative  Ventilator requirements stable  Resolved Hospital Problem list     Assessment & Plan:   Acute hypoxemic respiratory failure Multilobar pneumonia ARDS Concern for PJP pneumonia-negative testing -Remains on vent Oxygen requirement improving On Bactrim PJP prophylaxis -Completed antibiotics-Zosyn  Leukocytosis felt to be related to Solu-Medrol -Dose decreased -Will place back on usual dose of prednisone at 5 mg  Continue mechanical ventilation -Target TVol 6-8cc/kgIBW -Target Plateau Pressure < 30cm H20 -Target driving pressure less than 15 cm of water -Target PaO2 55-65: titrate PEEP/FiO2 per protocol -Ventilator associated pneumonia prevention protocol  Sedation and agitation management -On high doses of fentanyl and Versed-this has been decreased -Continue oral oxycodone and Klonopin  Has not needed paralytics  History of renal transplant secondary to acute renal failure S/p cadaveric renal transplantation on immunosuppressants Tacrolimus toxicity  Acute kidney injury on chronic kidney disease stage IIIa Discussed with renal this morning 7/16 -Will have another dialysis session today -Continue to avoid nephrotoxic medications -Maintain renal perfusion  Type 1 diabetes -On insulin infusion  Ileitis -Start Reglan  Hypertension -Was on amlodipine, resumed -Added labetalol, clonidine, will add hydralazine today -On Cleviprex-Hope to wean off Cleviprex today  Anemia of chronic illness -Continue to trend  Respiratory status has remained stable  Risk of decompensation remains Not a candidate for extubation at present hopefully in the next 48 hours  best Practice (right click and "Reselect all SmartList Selections" daily)   Diet/type: tubefeeds-on hold at present secondary to ileus DVT prophylaxis: prophylactic heparin  GI prophylaxis: PPI Lines: N/A Foley:  N/A Code Status:  full  code Last date of multidisciplinary goals of care discussion [discussed with significant other at bedside 7/15, 8:40 AM]  Labs   CBC: Recent Labs  Lab 10/15/22 0243 10/15/22 0550 10/16/22 0134 10/16/22 0355 10/16/22 1558 10/17/22 0457 10/17/22 0540 10/18/22 0440 10/19/22 0338  WBC 13.9*  --  10.4  --   --  15.1*  --  23.1* 23.1*  NEUTROABS 12.3*  --   --   --   --   --   --   --   --   HGB 8.6*   < > 8.4*   < > 9.9* 8.8* 9.2* 8.7* 9.0*  HCT 27.0*   < > 25.5*   < > 29.0* 27.1* 27.0* 26.8* 27.6*  MCV 77.8*  --  78.7*  --   --  79.5*  --  78.1* 78.0*  PLT 454*  --  422*  --   --  466*  --  461* 444*   < > = values in this interval not displayed.    Basic Metabolic Panel: Recent Labs  Lab 10/13/22 1600 10/14/22 0014 10/14/22 0221 10/14/22 0328 10/14/22 1422 10/14/22 1431 10/15/22 0243 10/15/22 0550 10/16/22 0134 10/16/22 0355 10/16/22 1558 10/17/22 0457 10/17/22 0540 10/18/22 0440 10/18/22 0956 10/19/22 0338  NA  --    < > 130*   < > 131*   < > 130*   < > 126*   < >  128* 126* 126* 125*  --  129*  K  --    < > 4.6   < > 4.4   < > 4.9   < > 5.3*   < > 5.4* 5.4* 5.5* 5.8* 6.2* 5.2*  CL  --   --  97*  --  96*  --  93*  --  93*  --   --  92*  --  91*  --  92*  CO2  --   --  20*  --  21*  --  19*  --  21*  --   --  23  --  21*  --  23  GLUCOSE  --   --  236*  --  202*  --  194*  --  227*  --   --  156*  --  142*  --  132*  BUN  --   --  84*  --  79*  --  78*  --  77*  --   --  88*  --  121*  --  111*  CREATININE  --   --  2.23*  --  2.20*  --  2.23*  --  2.14*  --   --  2.27*  --  3.15*  --  3.13*  CALCIUM  --   --  7.3*  --  7.6*  --  7.3*  --  7.2*  --   --  7.6*  --  7.7*  --  8.3*  MG 1.9  --  2.0  --  1.7  --  1.9  --   --   --   --  2.4  --   --   --   --   PHOS 6.1*  --  6.0*  --  6.1*  --  6.1*  --   --   --   --  6.1*  --   --   --  8.2*   < > = values in this interval not displayed.   GFR: Estimated Creatinine Clearance: 31.1 mL/min (A) (by C-G formula based  on SCr of 3.13 mg/dL (H)). Recent Labs  Lab 10/13/22 0123 10/14/22 0221 10/14/22 1422 10/14/22 1707 10/15/22 0243 10/16/22 0134 10/17/22 0457 10/18/22 0440 10/19/22 0338  PROCALCITON 0.51  --   --   --   --   --   --   --   --   WBC 33.0*   < >  --   --    < > 10.4 15.1* 23.1* 23.1*  LATICACIDVEN  --   --  1.4 1.1  --   --   --   --   --    < > = values in this interval not displayed.    Liver Function Tests: Recent Labs  Lab 10/13/22 0129 10/19/22 0338  ALBUMIN 2.5* 2.6*   No results for input(s): "LIPASE", "AMYLASE" in the last 168 hours. No results for input(s): "AMMONIA" in the last 168 hours.  ABG    Component Value Date/Time   PHART 7.323 (L) 10/17/2022 0540   PCO2ART 47.4 10/17/2022 0540   PO2ART 70 (L) 10/17/2022 0540   HCO3 24.7 10/17/2022 0540   TCO2 26 10/17/2022 0540   ACIDBASEDEF 2.0 10/17/2022 0540   O2SAT 93 10/17/2022 0540     Coagulation Profile: No results for input(s): "INR", "PROTIME" in the last 168 hours.  Cardiac Enzymes: No results for input(s): "CKTOTAL", "CKMB", "CKMBINDEX", "TROPONINI" in the last 168 hours.  HbA1C: Hgb A1c MFr Bld  Date/Time Value Ref Range Status  08/27/2022 03:43 AM 6.7 (H) 4.8 - 5.6 % Final    Comment:    (NOTE)         Prediabetes: 5.7 - 6.4         Diabetes: >6.4         Glycemic control for adults with diabetes: <7.0   11/21/2017 06:49 PM 9.9 (H) 4.8 - 5.6 % Final    Comment:    (NOTE) Pre diabetes:          5.7%-6.4% Diabetes:              >6.4% Glycemic control for   <7.0% adults with diabetes     CBG: Recent Labs  Lab 10/19/22 0444 10/19/22 0549 10/19/22 0639 10/19/22 0739 10/19/22 0844  GLUCAP 136* 118* 115* 121* 151*    Review of Systems:   10 point review of system taken, please see HPI for positives and negatives.   Past Medical History:  She,  has a past medical history of Allergy, Anemia associated with chronic renal failure, Anxiety, Asthma, Atypical chest pain, AV  (arteriovenous fistula) (HCC), CAD (coronary artery disease) (cardiologist-- dr Sharol Roussel [facility]- [place] [facility] in [place])), Depression, Elevated lipids, ESRD on hemodialysis (HCC) (NEPHROLOGIST-  DR MATTINGLY), Gastroparesis, GERD (gastroesophageal reflux disease), History of pneumonia, Hyperlipidemia, Hyperthyroidism, Menorrhagia, Other secondary hypertension, Peripheral neuropathy, PONV (postoperative nausea and vomiting), Pre-transplant evaluation for kidney transplant, Renal failure syndrome (03/05/2022), Secondary hyperparathyroidism of renal origin [facility]), Sleep apnea, and Type 2 diabetes mellitus, with long-term current use of insulin (HCC).   Surgical History:   Past Surgical History:  Procedure Laterality Date   AV FISTULA PLACEMENT  08/2010   Left radiocephalic AVF   AV FISTULA PLACEMENT Right 05/07/2014   Procedure: ARTERIOVENOUS (AV) FISTULA CREATION;  Surgeon: Sherren Kerns, MD;  Location: [facility] OR;  Service: Vascular;  Laterality: Right;   AV FISTULA PLACEMENT Right 07/02/2014   Procedure: INSERTION OF ARTERIOVENOUS (AV) GORE-TEX GRAFT ARM;  Surgeon: Sherren Kerns, MD;  Location: [facility] OR;  Service: Vascular;  Laterality: Right;   CARDIOVASCULAR STRESS TEST  06/25/2016   [facility]   normal nuclear perfusion study w/ no ischemia/  normal LV function and wall motion , ef 51%   CESAREAN SECTION  03/22/2001   w/ Bilateral Tubal Ligation   COLONOSCOPY     COLONOSCOPY WITH ESOPHAGOGASTRODUODENOSCOPY (EGD)  10/19/2011   DILATION AND CURETTAGE OF UTERUS  05/12/2000   w/ suction for missed ab   DOBUTAMINE STRESS ECHO  06/04/2016    [facility]   normal stress echo w/ no chest pain or ischemia/  normal LV function and wall motion , stress ef 60-65%   ESOPHAGOGASTRODUODENOSCOPY  10/19/2011   Dr. Stan Head   FEMUR IM NAIL Left child   removed   FOOT SURGERY Left 2014 approx.   HYSTEROSCOPY WITH NOVASURE N/A 08/19/2016   Procedure: HYSTEROSCOPY WITH NOVASURE;  Surgeon: Huel Cote,  MD;  Location: [facility];  Service: Gynecology;  Laterality: N/A;   KIDNEY TRANSPLANT  09/18/2016   LAPAROSCOPIC CHOLECYSTECTOMY  1994   LEFT HEART CATHETERIZATION WITH CORONARY ANGIOGRAM N/A 06/27/2012   Procedure: LEFT HEART CATHETERIZATION WITH CORONARY ANGIOGRAM;  Surgeon: Laurey Morale, MD;  Location: [facility] CATH LAB;  Service: Cardiovascular;  Laterality: N/A; No ostructive CAD, normal LVF, ef 55-60% (mild LAD luminal irregularities, moderate dRCA diffuse disease)   LIGATION GORETEX FISTULA  01/04/11   Left AVF  LIGATION OF ARTERIOVENOUS  FISTULA Left 08/21/2014   Procedure: LIGATION OF ARTERIOVENOUS  FISTULA;  Surgeon: Annice Needy, MD;  Location: [facility] ORS;  Service: Vascular;  Laterality: Left;   LIGATION OF ARTERIOVENOUS  FISTULA Left 06/29/2017   Procedure: LIGATION OF ARTERIOVENOUS  FISTULA ( RADIOCEPHALIC );  Surgeon: Annice Needy, MD;  Location: [facility] ORS;  Service: Vascular;  Laterality: Left;   NEPHRECTOMY TRANSPLANTED ORGAN     PERIPHERAL VASCULAR CATHETERIZATION Left 08/08/2014   Procedure: Upper Extremity Angiography;  Surgeon: Annice Needy, MD;  Location: [facility] INVASIVE CV LAB;  Service: Cardiovascular;  Laterality: Left;   PERIPHERAL VASCULAR CATHETERIZATION Left 08/08/2014   Procedure: Upper Extremity Intervention;  Surgeon: Annice Needy, MD;  Location: [facility] INVASIVE CV LAB;  Service: Cardiovascular;  Laterality: Left;   PERIPHERAL VASCULAR CATHETERIZATION Left 03/08/2016   Procedure: A/V Fistulagram;  Surgeon: Annice Needy, MD;  Location: [facility] INVASIVE CV LAB;  Service: Cardiovascular;  Laterality: Left;   POLYPECTOMY     THROMBECTOMY AND REVISION OF ARTERIOVENTOUS (AV) GORETEX  GRAFT Right 07/16/2014   Procedure: THROMBECTOMY  Right  arm  ARTERIOVENOUS  GORETEX  GRAFT;  Surgeon: Sherren Kerns, MD;  Location: [facility] OR;  Service: Vascular;  Laterality: Right;   TRANSTHORACIC ECHOCARDIOGRAM  06/04/2016   mild concentric LVH, ef 55-60%,  mild TR,  borderline LAE,  trivial MR  and PR     Social History:   reports that she has never smoked. She has never used smokeless tobacco. She reports that she does not drink alcohol and does not use drugs.   Family History:  Her family history includes Arthritis in her brother; Diabetes in her father, maternal aunt, and paternal aunt; Heart disease in her brother, mother, and paternal aunt; Hypertension in her father, maternal aunt, and paternal aunt; Kidney disease in her maternal grandmother and mother; Kidney failure in her paternal aunt and paternal uncle. There is no history of Colon cancer, Colon polyps, Crohn's disease, Esophageal cancer, Rectal cancer, Stomach cancer, or Ulcerative colitis.   Allergies Allergies  Allergen Reactions   Ciprofloxacin Hives, Itching and Nausea And Vomiting   Fluocinolone Itching, Nausea And Vomiting and Other (See Comments)   Hydromorphone Hives   Strawberry Extract Itching and Other (See Comments)    Pt states that this medication makes her tongue raw.   No reaction   Hydromorphone Hcl Hives   Phenytoin Sodium Extended Itching   Adhesive [Tape] Itching and Rash   Chlorhexidine Gluconate Itching   Clindamycin Diarrhea, Nausea And Vomiting and Rash   Shrimp [Shellfish Allergy] Rash   The patient is critically ill with multiple organ systems failure and requires high complexity decision making for assessment and support, frequent evaluation and titration of therapies, application of advanced monitoring technologies and extensive interpretation of multiple databases. Critical Care Time devoted to patient care services described in this note independent of APP/resident time (if applicable)  is 35 minutes.   Virl Diamond MD [facility] Personal pager: See Amion If unanswered, please page CCM On-call: #[phone]

## 2022-10-19 NOTE — Progress Notes (Signed)
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  Subjective:  CCM placed R internal jugular Temp HD cath yesterday Rec HD, 3L UF tolerated well; ended up 1.7L negative yesterday This AM K 5.2, 40% FIO2 Only 0.6L UOP On Tac 1 SL BID, notes reviewd; tac level sent earlier today; also on MMF and inc dose steroids for pulm indications Fiance at bedside, updated and discussed status Diffusely volume overloaded continues  07/15 0701 - 07/16 0700 In: 2876 [I.V.:1616; NG/GT:1160; IV Piggyback:100] Out: 4605 [Urine:635; Emesis/NG output:925; Stool:45]  Filed Weights   10/18/22 1703 10/18/22 1952 10/19/22 0447  Weight: 136 kg 133 kg 130.1 kg    Scheduled Meds:  amLODipine  10 mg Per Tube Daily   arformoterol  15 mcg Nebulization BID   ARIPiprazole  30 mg Per Tube Daily   artificial tears  1 Application Both Eyes Q8H   atorvastatin  10 mg Per Tube Daily   buPROPion  225 mg Per Tube BID   clonazePAM  1 mg Per Tube BID   cloNIDine  0.3 mg Per Tube TID   famotidine  20 mg Per Tube Daily   fentaNYL (SUBLIMAZE) injection  25 mcg Intravenous Once   folic acid  1 mg Per Tube Daily   guaiFENesin  400 mg Per Tube Q4H   heparin  5,000 Units Subcutaneous Q8H   hydrOXYzine  25 mg Per Tube QHS   labetalol  400 mg Per Tube TID   lactulose  30 g Per Tube TID   loratadine  10 mg Per Tube Daily   metoCLOPramide (REGLAN) injection  10 mg Intravenous Q6H   montelukast  10 mg Per Tube QHS   multivitamin with minerals  1 tablet Per Tube Daily   mycophenolate  1,000 mg Per Tube BID   mouth rinse  15 mL Mouth Rinse Q2H   oxyCODONE  10 mg Per Tube Q6H   polyethylene glycol  17 g Per Tube BID   prazosin  1 mg Per Tube QHS   predniSONE  40 mg Per Tube Q breakfast   revefenacin  175 mcg Nebulization Daily   senna-docusate  1 tablet Per Tube BID   sodium bicarbonate  1,300 mg Per Tube BID   sulfamethoxazole-trimethoprim  1 tablet Per Tube Once per day on Monday Wednesday  Friday   tacrolimus  1 mg Sublingual BID   Continuous Infusions:  sodium chloride 10 mL/hr at 10/19/22 0800   clevidipine 6 mg/hr (10/19/22 0800)   dextrose 5% lactated ringers 50 mL/hr at 10/19/22 0800   feeding supplement (VITAL AF 1.2 CAL) Stopped (10/18/22 2300)   fentaNYL infusion INTRAVENOUS 150 mcg/hr (10/19/22 0800)   insulin 1.4 Units/hr (10/19/22 0800)   midazolam 3 mg/hr (10/19/22 0800)   PRN Meds:.sodium chloride, acetaminophen, albuterol, dextrose, fentaNYL, guaiFENesin, hydrALAZINE, labetalol, midazolam, midazolam, mouth rinse, oxyCODONE, vecuronium  Current Labs: reviewed today  Physical Exam:  Blood pressure (!) 166/71, pulse 95, temperature 97.7 F (36.5 C), resp. rate (!) 22, height 5\' 6"  (1.676 m), weight 130.1 kg, SpO2 99%. Intubated, sedated  A AKI on CKD3a in setting of ddKT 2018 (BL SCr 1.4-1.6) baseline IS Tac/MMF/pred Started HD 7/15 for refractory hypervolemia and hyperkalemia Using R internal jugular Temp HD cath placed 7/15 by CCM Hopefully this is a short term of RRT Azotemia, as above; BUN remains > 100 Hyperkalemia, persistent, trend with HD AHRF / Acute asthma exacerbation PCP excluded, off high doseTMP/SMX; ABX per CCM Inbutaed 7/10  IS, using Tac SL 1mg  BID (50% dose reduction from baseline), on cellcept now (go back to myfortic when tol PO); higher dose steriod from pulm indications; back on three times per week TMP/SMX AGMA, on PO NaHCO3, stop today now on HD HTN: off clevipres,BPs stable on amlodipine Severe hypervolemia / anasarca  P HD again today 3-4L UF.  2K bath.  Stop NaHCO3 2K, 3.5h, 250 BFR, 3-4L UF Patient status and daily plan discussed with CCM Olalere MD and primary RN  Still hopeful for GFR recovery in time and supportive care All questions answered for fiance Medication Issues; Preferred narcotic agents for pain control are hydromorphone, fentanyl, and methadone. Morphine should not be used.  Baclofen should be  avoided Avoid oral sodium phosphate and magnesium citrate based laxatives / bowel preps    Sabra Heck MD 10/19/2022, 9:40 AM  Recent Labs  Lab 10/15/22 0243 10/15/22 0550 10/17/22 0457 10/17/22 0540 10/18/22 0440 10/18/22 0956 10/19/22 0338  NA 130*   < > 126* 126* 125*  --  129*  K 4.9   < > 5.4* 5.5* 5.8* 6.2* 5.2*  CL 93*   < > 92*  --  91*  --  92*  CO2 19*   < > 23  --  21*  --  23  GLUCOSE 194*   < > 156*  --  142*  --  132*  BUN 78*   < > 88*  --  121*  --  111*  CREATININE 2.23*   < > 2.27*  --  3.15*  --  3.13*  CALCIUM 7.3*   < > 7.6*  --  7.7*  --  8.3*  PHOS 6.1*  --  6.1*  --   --   --  8.2*   < > = values in this interval not displayed.   Recent Labs  Lab 10/15/22 0243 10/15/22 0550 10/17/22 0457 10/17/22 0540 10/18/22 0440 10/19/22 0338  WBC 13.9*   < > 15.1*  --  23.1* 23.1*  NEUTROABS 12.3*  --   --   --   --   --   HGB 8.6*   < > 8.8* 9.2* 8.7* 9.0*  HCT 27.0*   < > 27.1* 27.0* 26.8* 27.6*  MCV 77.8*   < > 79.5*  --  78.1* 78.0*  PLT 454*   < > 466*  --  461* 444*   < > = values in this interval not displayed.

## 2022-10-20 ENCOUNTER — Other Ambulatory Visit: Payer: Self-pay | Admitting: Pulmonary Disease

## 2022-10-20 DIAGNOSIS — J9601 Acute respiratory failure with hypoxia: Secondary | ICD-10-CM | POA: Diagnosis not present

## 2022-10-20 DIAGNOSIS — J189 Pneumonia, unspecified organism: Secondary | ICD-10-CM | POA: Diagnosis not present

## 2022-10-20 DIAGNOSIS — N179 Acute kidney failure, unspecified: Secondary | ICD-10-CM | POA: Diagnosis not present

## 2022-10-20 DIAGNOSIS — D849 Immunodeficiency, unspecified: Secondary | ICD-10-CM | POA: Diagnosis not present

## 2022-10-20 LAB — GLUCOSE, CAPILLARY
Glucose-Capillary: 138 mg/dL — ABNORMAL HIGH (ref 70–99)
Glucose-Capillary: 131 mg/dL — ABNORMAL HIGH (ref 70–99)
Glucose-Capillary: 133 mg/dL — ABNORMAL HIGH (ref 70–99)
Glucose-Capillary: 131 mg/dL — ABNORMAL HIGH (ref 70–99)
Glucose-Capillary: 143 mg/dL — ABNORMAL HIGH (ref 70–99)
Glucose-Capillary: 153 mg/dL — ABNORMAL HIGH (ref 70–99)
Glucose-Capillary: 143 mg/dL — ABNORMAL HIGH (ref 70–99)
Glucose-Capillary: 163 mg/dL — ABNORMAL HIGH (ref 70–99)
Glucose-Capillary: 147 mg/dL — ABNORMAL HIGH (ref 70–99)
Glucose-Capillary: 141 mg/dL — ABNORMAL HIGH (ref 70–99)
Glucose-Capillary: 144 mg/dL — ABNORMAL HIGH (ref 70–99)
Glucose-Capillary: 160 mg/dL — ABNORMAL HIGH (ref 70–99)
Glucose-Capillary: 206 mg/dL — ABNORMAL HIGH (ref 70–99)

## 2022-10-20 LAB — CBC
HCT: 26.8 % — ABNORMAL LOW (ref 36.0–46.0)
Hemoglobin: 8.9 g/dL — ABNORMAL LOW (ref 12.0–15.0)
MCH: 25.9 pg — ABNORMAL LOW (ref 26.0–34.0)
MCHC: 33.2 g/dL (ref 30.0–36.0)
MCV: 77.9 fL — ABNORMAL LOW (ref 80.0–100.0)
Platelets: 422 10*3/uL — ABNORMAL HIGH (ref 150–400)
RBC: 3.44 MIL/uL — ABNORMAL LOW (ref 3.87–5.11)
RDW: 15.1 % (ref 11.5–15.5)
WBC: 20.5 10*3/uL — ABNORMAL HIGH (ref 4.0–10.5)
nRBC: 2 % — ABNORMAL HIGH (ref 0.0–0.2)

## 2022-10-20 LAB — RENAL FUNCTION PANEL
Albumin: 2.7 g/dL — ABNORMAL LOW (ref 3.5–5.0)
Anion gap: 17 — ABNORMAL HIGH (ref 5–15)
BUN: 133 mg/dL — ABNORMAL HIGH (ref 6–20)
CO2: 22 mmol/L (ref 22–32)
Calcium: 8.7 mg/dL — ABNORMAL LOW (ref 8.9–10.3)
Chloride: 92 mmol/L — ABNORMAL LOW (ref 98–111)
Creatinine, Ser: 3.86 mg/dL — ABNORMAL HIGH (ref 0.44–1.00)
GFR, Estimated: 14 mL/min — ABNORMAL LOW (ref >60)
Glucose, Bld: 134 mg/dL — ABNORMAL HIGH (ref 70–99)
Phosphorus: 9.7 mg/dL — ABNORMAL HIGH (ref 2.5–4.6)
Potassium: 5.2 mmol/L — ABNORMAL HIGH (ref 3.5–5.1)
Sodium: 131 mmol/L — ABNORMAL LOW (ref 135–145)

## 2022-10-20 LAB — TACROLIMUS LEVEL: Tacrolimus (FK506) - LabCorp: 0.8 ng/mL — ABNORMAL LOW (ref 2.0–20.0)

## 2022-10-20 LAB — TROPONIN I (HIGH SENSITIVITY): Troponin I (High Sensitivity): 26 ng/L — ABNORMAL HIGH (ref <18)

## 2022-10-20 MED ORDER — ETOMIDATE 2 MG/ML IV SOLN
INTRAVENOUS | Status: AC
  Filled 2022-10-20: qty 10

## 2022-10-20 MED ORDER — LACTULOSE 10 GM/15ML PO SOLN
20.0000 g | Freq: Two times a day (BID) | ORAL | Status: DC
  Administered 2022-10-20 – 2022-10-22 (×4): 20 g
  Filled 2022-10-20 (×5): qty 30

## 2022-10-20 MED ORDER — DEXTROSE 10 % IV SOLN: INTRAVENOUS | Status: DC | PRN

## 2022-10-20 MED ORDER — LIDOCAINE HCL (PF) 1 % IJ SOLN
10.0000 mL | Freq: Once | INTRAMUSCULAR | Status: AC
  Administered 2022-10-20: 10 mL

## 2022-10-20 MED ORDER — INSULIN DETEMIR 100 UNIT/ML ~~LOC~~ SOLN
8.0000 [IU] | Freq: Two times a day (BID) | SUBCUTANEOUS | Status: DC
  Administered 2022-10-20: 8 [IU] via SUBCUTANEOUS
  Filled 2022-10-20 (×2): qty 0.08

## 2022-10-20 MED ORDER — HEPARIN SODIUM (PORCINE) 1000 UNIT/ML IJ SOLN
INTRAMUSCULAR | Status: AC
  Administered 2022-10-20: 2400 [IU]
  Filled 2022-10-20: qty 3

## 2022-10-20 MED ORDER — INSULIN ASPART 100 UNIT/ML IJ SOLN
1.0000 [IU] | INTRAMUSCULAR | Status: DC
  Administered 2022-10-20: 2 [IU] via SUBCUTANEOUS
  Administered 2022-10-20: 3 [IU] via SUBCUTANEOUS

## 2022-10-20 MED ORDER — ANTICOAGULANT SODIUM CITRATE 4% (200MG/5ML) IV SOLN: 5.0000 mL | Status: DC | PRN

## 2022-10-20 MED ORDER — ETOMIDATE 2 MG/ML IV SOLN
20.0000 mg | Freq: Once | INTRAVENOUS | Status: AC
  Administered 2022-10-20: 10 mg via INTRAVENOUS

## 2022-10-20 MED ORDER — INSULIN ASPART 100 UNIT/ML IJ SOLN: 2.0000 [IU] | INTRAMUSCULAR | Status: DC

## 2022-10-20 MED ORDER — LIDOCAINE HCL (PF) 1 % IJ SOLN
INTRAMUSCULAR | Status: AC
  Filled 2022-10-20: qty 10

## 2022-10-20 MED ORDER — HEPARIN SODIUM (PORCINE) 1000 UNIT/ML DIALYSIS
1000.0000 [IU] | INTRAMUSCULAR | Status: DC | PRN
  Filled 2022-10-20: qty 1

## 2022-10-20 MED ORDER — ALTEPLASE 2 MG IJ SOLR: 2.0000 mg | Freq: Once | INTRAMUSCULAR | Status: DC | PRN

## 2022-10-20 MED ORDER — INSULIN DETEMIR 100 UNIT/ML ~~LOC~~ SOLN
8.0000 [IU] | Freq: Two times a day (BID) | SUBCUTANEOUS | Status: DC
  Filled 2022-10-20: qty 0.08

## 2022-10-20 MED ORDER — VITAL AF 1.2 CAL PO LIQD
1000.0000 mL | ORAL | Status: DC
  Administered 2022-10-20 – 2022-10-23 (×2): 1000 mL

## 2022-10-20 NOTE — Procedures (Signed)
HD Note:  Some information was entered later than the data was gathered due to patient care needs. The stated time with the data is accurate.  Patient treatment performed at bedside.  Patient on vent and non responsive.  Informed consent signed and in chart.   TX duration: 3.5 hours  Patient tolerated treatment well.   Access used: R temp internal jugular HD catheter Access issues: None  Total UF removed: 4000 ml  Hand-off given to patient's nurse.   Aleese Kamps L. Dareen Piano, RN Kidney Dialysis Unit.

## 2022-10-20 NOTE — Progress Notes (Addendum)
  Decision made to perform bronchoscopy to assess inability to pass suction catheter to endotracheal tube  Significant resistance met while trying to pass the bronchoscope  Could not pass the bronchoscope  Decision made to exchange endotracheal tube over bougie  -This was also unsuccessful  Endotracheal tube was exchanged for a size 7.5 tube. The tube that was taken out was examined showing caked immovable secretions in the distal end of the tube  Received 100 mg of fentanyl, 10 of etomidate and 1 of Versed for the intubation  Bronchoscope was introduced through the new tube advanced into both airways and washings performed in the left lower lobe.  Will be sent for analysis

## 2022-10-20 NOTE — Procedures (Signed)
Bronchoscopy Procedure Note  Linda Ellison  [id number]  06/14/1975  Date:10/20/22  Time:3:51 PM   Provider Performing:Chelby Salata [name]   Procedure(s):  Flexible Bronchoscopy [phone])  Indication(s) Difficulty passing suction catheter to endotracheal tube  Consent Verbal consent from significant other  Anesthesia 100 mg of fentanyl, 1 mg Versed, 10 mg etomidate   Time Out Verified patient identification, verified procedure, site/side was marked, verified correct patient position, special equipment/implants available, medications/allergies/relevant history reviewed, required imaging and test results available.   Sterile Technique Usual hand hygiene, masks, gowns, and gloves were used   Procedure Description Bronchoscope advanced through endotracheal tube and into airway.  Airways were examined down to subsegmental level with findings noted below.   Following diagnostic evaluation, BAL(s) performed in 60 cc with normal saline and return of 20 cc fluid  Findings: Airway was clear   Complications/Tolerance None; patient tolerated the procedure well. Chest X-ray is not needed post procedure.   EBL Minimal   Specimen(s) BAL left lower lobe sent for analysis

## 2022-10-20 NOTE — Procedures (Signed)
I was present at this session.  I have reviewed the session itself and made appropriate changes.  Louis Matte 7/17/20249:09 AM

## 2022-10-20 NOTE — Plan of Care (Signed)
  Problem: Nutritional: Goal: Maintenance of adequate nutrition will improve Outcome: Progressing Goal: Progress toward achieving an optimal weight will improve Outcome: Progressing   Problem: Clinical Measurements: Goal: Ability to maintain clinical measurements within normal limits will improve Outcome: Progressing Goal: Will remain free from infection Outcome: Progressing Goal: Diagnostic test results will improve Outcome: Progressing Goal: Respiratory complications will improve Outcome: Progressing Goal: Cardiovascular complication will be avoided Outcome: Progressing   Problem: Nutrition: Goal: Adequate nutrition will be maintained Outcome: Progressing   Problem: Elimination: Goal: Will not experience complications related to bowel motility Outcome: Progressing Goal: Will not experience complications related to urinary retention Outcome: Progressing

## 2022-10-20 NOTE — Progress Notes (Signed)
NAME:  Linda Ellison, MRN:  [id number], DOB:  Feb 27, 1976, LOS: 10 ADMISSION DATE:  10/09/2022, CONSULTATION DATE: 10/12/2022 REFERRING MD: Triad, CHIEF COMPLAINT: Respiratory distress  History of Present Illness:  47 year old female with extensive past medical history is well-documented below who was admitted 2 days prior to this consult with multifocal pneumonia.  She has had increasing work of breathing with a respiratory rate of 46.  She had a CT scan that demonstrates bilateral multifocal pneumonia, elevated BNP, and increased work of breathing that will require her being transferred to the intensive care unit.  She has noted a nonproductive cough prior to admission.  Increasing shortness of breath prior to admission.  And increasing lower extremity edema.  She is status post cadaveric renal transplant and she is followed by nephrology.  Currently she is hypokalemic with creatinine in the 2.2 range.  Due to her increasing work of breathing impending respiratory failure she will be transferred to intensive care unit for further evaluation and treatment.  She has already been started on aggressive diuresis and steroids and empirical antimicrobial therapy.  Pertinent  Medical History   Past Medical History:  Diagnosis Date   Allergy    Anemia associated with chronic renal failure    Anxiety    Asthma    Atypical chest pain    long-standing -- normal cardio cath 06-27-2012 and normal nuclear stress test 06-25-2016   AV (arteriovenous fistula) (HCC)    for dialysis-- currently located left radiocephalic   CAD (coronary artery disease) cardiologist-- dr Sharol Roussel [facility] cardiology in [place])   a. False positive stress echo 06/2012 at [facility] - cath with no obstructive CAD at [facility] (mild luminal irregularities in LAD, moderate diffuse disease in distal RCA).   Depression    Elevated lipids    ESRD on hemodialysis [facility]) NEPHROLOGIST-  DR MATTINGLY   started dialysis 01/29/11:  Foye Spurling [facility] on MWF   Gastroparesis    GERD (gastroesophageal reflux disease)    History of pneumonia    HCAP 04/ 2017   Hyperlipidemia    Hyperthyroidism    Menorrhagia    Other secondary hypertension    associated to diabetes -- followed by cardiologist ( dr Sharol Roussel)     Peripheral neuropathy    PONV (postoperative nausea and vomiting)    Pre-transplant evaluation for kidney transplant    [facility]   Renal failure syndrome 03/05/2022   Secondary hyperparathyroidism of renal origin [facility])    Sleep apnea    Type 2 diabetes mellitus, with long-term current use of insulin (HCC)    dx 1985   Significant Hospital Events: Including procedures, antibiotic start and stop dates in addition to other pertinent events   07/09 Transferred to ICU  07/10: Intubated  7/13-improving O2 requirement 7/15 worsening renal function, fluid overloaded 7/15-tolerated hemodialysis   Interim History / Subjective:  Some ileus -Started on Reglan Did move bowels yesterday She is arousable  -Appears very weak Objective   Blood pressure (!) 166/71, pulse 92, temperature 98.8 F (37.1 C), resp. rate (!) 27, height 5\' 6"  (1.676 m), weight 129.7 kg, SpO2 98%.      Vent Mode: PRVC FiO2 (%):  [40 %] 40 % Set Rate:  [24 bmp] 24 bmp Vt Set:  [470 mL] 470 mL PEEP:  [5 cmH20] 5 cmH20 Plateau Pressure:  [20 cmH20-36 cmH20] 34 cmH20   Intake/Output Summary (Last 24 hours) at 10/20/2022 8469 Last data filed at 10/20/2022  0600 Gross per 24 hour  Intake 2568.76 ml  Output 2945 ml  Net -376.24 ml   Filed Weights   10/18/22 1952 10/19/22 0447 10/20/22 0500  Weight: 133 kg 130.1 kg 129.7 kg    Examination: General: Middle-age, opens eyes to name calling, not able to follow commands HENT: Moist oral mucosa, endotracheal tube in place Lungs: Coarse breath sounds Cardiovascular: S1-S2 appreciated with no murmur Abdomen: Full, bowel sounds appreciated,  hypoactive Extremities: Anasarca Neuro: Intubated and sedated  BUN 133, creatinine 3.86 WBC 20.1-trending down, hematocrit 27.6  BAL culture-no organisms PJP PCR negative  Ventilator requirements stable -Rate of 24, tidal volume 470, PEEP of 5, FiO2 40%  Resolved Hospital Problem list   ARDS-result  Assessment & Plan:   Acute hypoxemic respiratory failure Multilobar pneumonia ARDS-resolved Negative testing for PJP pneumonia -Remains on full vent support -Oxygen requirement has stayed stable -On Bactrim PJP prophylaxis -Completed course of Zosyn  Anasarca -Receiving dialysis -Continue to pull off fluid  Leukocytosis -Felt to be related to steroids -Dose of steroids now at home dose of 5 mg of prednisone  Acute hypoxemic respiratory failure -Continue mechanical ventilation -Target TVol 6-8cc/kgIBW -Target Plateau Pressure < 30cm H20 -Target driving pressure less than 15 cm of water -Target PaO2 55-65: titrate PEEP/FiO2 per protocol -Ventilator associated pneumonia prevention protocol  Sedation and agitation management -Doses of fentanyl and Versed been weaned down -On oral oxycodone and Klonopin -She has eyes open and appears to respond to name calling but not following commands  History of renal transplant secondary to acute renal failure S/p cadaveric renal transplantation on immunosuppressants -Tacrolimus toxicity -Continue tacrolimus, continue CellCept  Acute kidney injury on chronic kidney disease stage IIIa -Appreciate renal follow-up -Avoid nephrotoxic medications -On dialysis  Type 1 diabetes -On insulin infusion  Ileus -On Reglan -Did move bowels yesterday  Difficult to control hypertension -On labetalol, clonidine, hydralazine added -Remains difficult to wean off Cleviprex  Anemia of chronic illness -Continue to trend  Respiratory status remains stable although getting to about 14 days of being on the ventilator Not a candidate for  extubation at present-still has ongoing acute problems Hopefully, we will be able to get more fluid off the system with dialysis  Difficulty passing suction catheter via endotracheal tube -Will plan for bronchoscopy today to assess -May need a tube exchange  best Practice (right click and "Reselect all SmartList Selections" daily)   Diet/type: tubefeeds-on hold at present secondary to ileus DVT prophylaxis: prophylactic heparin  GI prophylaxis: PPI Lines: N/A Foley:  N/A Code Status:  full code Last date of multidisciplinary goals of care discussion [discussed with significant other at bedside 7/15, 8:40 AM]  Labs   CBC: Recent Labs  Lab 10/15/22 0243 10/15/22 0550 10/16/22 0134 10/16/22 0355 10/17/22 0457 10/17/22 0540 10/18/22 0440 10/19/22 0338 10/20/22 0235  WBC 13.9*  --  10.4  --  15.1*  --  23.1* 23.1* 20.5*  NEUTROABS 12.3*  --   --   --   --   --   --   --   --   HGB 8.6*   < > 8.4*   < > 8.8* 9.2* 8.7* 9.0* 8.9*  HCT 27.0*   < > 25.5*   < > 27.1* 27.0* 26.8* 27.6* 26.8*  MCV 77.8*  --  78.7*  --  79.5*  --  78.1* 78.0* 77.9*  PLT 454*  --  422*  --  466*  --  461* 444* 422*   < > =  values in this interval not displayed.    Basic Metabolic Panel: Recent Labs  Lab 10/13/22 1600 10/14/22 0014 10/14/22 0221 10/14/22 0328 10/14/22 1422 10/14/22 1431 10/15/22 0243 10/15/22 0550 10/16/22 0134 10/16/22 0355 10/17/22 0457 10/17/22 0540 10/18/22 0440 10/18/22 0956 10/19/22 0338 10/20/22 0235  NA  --    < > 130*   < > 131*   < > 130*   < > 126*   < > 126* 126* 125*  --  129* 131*  K  --    < > 4.6   < > 4.4   < > 4.9   < > 5.3*   < > 5.4* 5.5* 5.8* 6.2* 5.2* 5.2*  CL  --    < > 97*  --  96*  --  93*  --  93*  --  92*  --  91*  --  92* 92*  CO2  --    < > 20*  --  21*  --  19*  --  21*  --  23  --  21*  --  23 22  GLUCOSE  --    < > 236*  --  202*  --  194*  --  227*  --  156*  --  142*  --  132* 134*  BUN  --    < > 84*  --  79*  --  78*  --  77*  --  88*   --  121*  --  111* 133*  CREATININE  --    < > 2.23*  --  2.20*  --  2.23*  --  2.14*  --  2.27*  --  3.15*  --  3.13* 3.86*  CALCIUM  --    < > 7.3*  --  7.6*  --  7.3*  --  7.2*  --  7.6*  --  7.7*  --  8.3* 8.7*  MG 1.9  --  2.0  --  1.7  --  1.9  --   --   --  2.4  --   --   --   --   --   PHOS 6.1*  --  6.0*  --  6.1*  --  6.1*  --   --   --  6.1*  --   --   --  8.2* 9.7*   < > = values in this interval not displayed.   GFR: Estimated Creatinine Clearance: 25.2 mL/min (A) (by C-G formula based on SCr of 3.86 mg/dL (H)). Recent Labs  Lab 10/14/22 1422 10/14/22 1707 10/15/22 0243 10/17/22 0457 10/18/22 0440 10/19/22 0338 10/20/22 0235  WBC  --   --    < > 15.1* 23.1* 23.1* 20.5*  LATICACIDVEN 1.4 1.1  --   --   --   --   --    < > = values in this interval not displayed.    Liver Function Tests: Recent Labs  Lab 10/19/22 0338 10/20/22 0235  ALBUMIN 2.6* 2.7*   No results for input(s): "LIPASE", "AMYLASE" in the last 168 hours. No results for input(s): "AMMONIA" in the last 168 hours.  ABG    Component Value Date/Time   PHART 7.323 (L) 10/17/2022 0540   PCO2ART 47.4 10/17/2022 0540   PO2ART 70 (L) 10/17/2022 0540   HCO3 24.7 10/17/2022 0540   TCO2 26 10/17/2022 0540   ACIDBASEDEF 2.0 10/17/2022 0540   O2SAT 93 10/17/2022 0540     Coagulation Profile:  No results for input(s): "INR", "PROTIME" in the last 168 hours.  Cardiac Enzymes: No results for input(s): "CKTOTAL", "CKMB", "CKMBINDEX", "TROPONINI" in the last 168 hours.  HbA1C: Hgb A1c MFr Bld  Date/Time Value Ref Range Status  08/27/2022 03:43 AM 6.7 (H) 4.8 - 5.6 % Final    Comment:    (NOTE)         Prediabetes: 5.7 - 6.4         Diabetes: >6.4         Glycemic control for adults with diabetes: <7.0   11/21/2017 06:49 PM 9.9 (H) 4.8 - 5.6 % Final    Comment:    (NOTE) Pre diabetes:          5.7%-6.4% Diabetes:              >6.4% Glycemic control for   <7.0% adults with diabetes      CBG: Recent Labs  Lab 10/20/22 0324 10/20/22 0421 10/20/22 0528 10/20/22 0619 10/20/22 0729  GLUCAP 133* 131* 143* 153* 143*    Review of Systems:   10 point review of system taken, please see HPI for positives and negatives.   Past Medical History:  She,  has a past medical history of Allergy, Anemia associated with chronic renal failure, Anxiety, Asthma, Atypical chest pain, AV (arteriovenous fistula) (HCC), CAD (coronary artery disease) (cardiologist-- dr Sharol Roussel [facility] cardiology in [place])), Depression, Elevated lipids, ESRD on hemodialysis (HCC) (NEPHROLOGIST-  DR MATTINGLY), Gastroparesis, GERD (gastroesophageal reflux disease), History of pneumonia, Hyperlipidemia, Hyperthyroidism, Menorrhagia, Other secondary hypertension, Peripheral neuropathy, PONV (postoperative nausea and vomiting), Pre-transplant evaluation for kidney transplant, Renal failure syndrome (03/05/2022), Secondary hyperparathyroidism of renal origin [facility]), Sleep apnea, and Type 2 diabetes mellitus, with long-term current use of insulin (HCC).   Surgical History:   Past Surgical History:  Procedure Laterality Date   AV FISTULA PLACEMENT  08/2010   Left radiocephalic AVF   AV FISTULA PLACEMENT Right 05/07/2014   Procedure: ARTERIOVENOUS (AV) FISTULA CREATION;  Surgeon: Sherren Kerns, MD;  Location: [facility] OR;  Service: Vascular;  Laterality: Right;   AV FISTULA PLACEMENT Right 07/02/2014   Procedure: INSERTION OF ARTERIOVENOUS (AV) GORE-TEX GRAFT ARM;  Surgeon: Sherren Kerns, MD;  Location: [facility] OR;  Service: Vascular;  Laterality: Right;   CARDIOVASCULAR STRESS TEST  06/25/2016   [facility]   normal nuclear perfusion study w/ no ischemia/  normal LV function and wall motion , ef 51%   CESAREAN SECTION  03/22/2001   w/ Bilateral Tubal Ligation   COLONOSCOPY     COLONOSCOPY WITH ESOPHAGOGASTRODUODENOSCOPY (EGD)  10/19/2011   DILATION AND CURETTAGE OF UTERUS  05/12/2000   w/ suction for missed  ab   DOBUTAMINE STRESS ECHO  06/04/2016    [facility]   normal stress echo w/ no chest pain or ischemia/  normal LV function and wall motion , stress ef 60-65%   ESOPHAGOGASTRODUODENOSCOPY  10/19/2011   Dr. Stan Head   FEMUR IM NAIL Left child   removed   FOOT SURGERY Left 2014 approx.   HYSTEROSCOPY WITH NOVASURE N/A 08/19/2016   Procedure: HYSTEROSCOPY WITH NOVASURE;  Surgeon: Huel Cote, MD;  Location: [facility];  Service: Gynecology;  Laterality: N/A;   KIDNEY TRANSPLANT  09/18/2016   LAPAROSCOPIC CHOLECYSTECTOMY  1994   LEFT HEART CATHETERIZATION WITH CORONARY ANGIOGRAM N/A 06/27/2012   Procedure: LEFT HEART CATHETERIZATION WITH CORONARY ANGIOGRAM;  Surgeon: Laurey Morale, MD;  Location: [facility] CATH LAB;  Service: Cardiovascular;  Laterality: N/A;  No ostructive CAD, normal LVF, ef 55-60% (mild LAD luminal irregularities, moderate dRCA diffuse disease)   LIGATION GORETEX FISTULA  01/04/11   Left AVF   LIGATION OF ARTERIOVENOUS  FISTULA Left 08/21/2014   Procedure: LIGATION OF ARTERIOVENOUS  FISTULA;  Surgeon: Annice Needy, MD;  Location: [facility] ORS;  Service: Vascular;  Laterality: Left;   LIGATION OF ARTERIOVENOUS  FISTULA Left 06/29/2017   Procedure: LIGATION OF ARTERIOVENOUS  FISTULA ( RADIOCEPHALIC );  Surgeon: Annice Needy, MD;  Location: [facility] ORS;  Service: Vascular;  Laterality: Left;   NEPHRECTOMY TRANSPLANTED ORGAN     PERIPHERAL VASCULAR CATHETERIZATION Left 08/08/2014   Procedure: Upper Extremity Angiography;  Surgeon: Annice Needy, MD;  Location: [facility] INVASIVE CV LAB;  Service: Cardiovascular;  Laterality: Left;   PERIPHERAL VASCULAR CATHETERIZATION Left 08/08/2014   Procedure: Upper Extremity Intervention;  Surgeon: Annice Needy, MD;  Location: [facility] INVASIVE CV LAB;  Service: Cardiovascular;  Laterality: Left;   PERIPHERAL VASCULAR CATHETERIZATION Left 03/08/2016   Procedure: A/V Fistulagram;  Surgeon: Annice Needy, MD;  Location: [facility] INVASIVE CV LAB;  Service:  Cardiovascular;  Laterality: Left;   POLYPECTOMY     THROMBECTOMY AND REVISION OF ARTERIOVENTOUS (AV) GORETEX  GRAFT Right 07/16/2014   Procedure: THROMBECTOMY  Right  arm  ARTERIOVENOUS  GORETEX  GRAFT;  Surgeon: Sherren Kerns, MD;  Location: [facility] OR;  Service: Vascular;  Laterality: Right;   TRANSTHORACIC ECHOCARDIOGRAM  06/04/2016   mild concentric LVH, ef 55-60%,  mild TR,  borderline LAE,  trivial MR and PR     Social History:   reports that she has never smoked. She has never used smokeless tobacco. She reports that she does not drink alcohol and does not use drugs.   Family History:  Her family history includes Arthritis in her brother; Diabetes in her father, maternal aunt, and paternal aunt; Heart disease in her brother, mother, and paternal aunt; Hypertension in her father, maternal aunt, and paternal aunt; Kidney disease in her maternal grandmother and mother; Kidney failure in her paternal aunt and paternal uncle. There is no history of Colon cancer, Colon polyps, Crohn's disease, Esophageal cancer, Rectal cancer, Stomach cancer, or Ulcerative colitis.   Allergies Allergies  Allergen Reactions   Ciprofloxacin Hives, Itching and Nausea And Vomiting   Fluocinolone Itching, Nausea And Vomiting and Other (See Comments)   Hydromorphone Hives   Strawberry Extract Itching and Other (See Comments)    Pt states that this medication makes her tongue raw.   No reaction   Hydromorphone Hcl Hives   Phenytoin Sodium Extended Itching   Adhesive [Tape] Itching and Rash   Chlorhexidine Gluconate Itching   Clindamycin Diarrhea, Nausea And Vomiting and Rash   Shrimp [Shellfish Allergy] Rash   The patient is critically ill with multiple organ systems failure and requires high complexity decision making for assessment and support, frequent evaluation and titration of therapies, application of advanced monitoring technologies and extensive interpretation of multiple databases. Critical Care Time  devoted to patient care services described in this note independent of APP/resident time (if applicable)  is 40 minutes.   Virl Diamond MD [place] [facility] Personal pager: See Amion If unanswered, please page CCM On-call: [phone]

## 2022-10-20 NOTE — Progress Notes (Signed)
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  Subjective:  Didn't rec HD overnight 2/2 pt census 0.6L UOP, BUN up to 133, K 5.2 40% FIO2 Starting HD this AM Fiance at bedside, updated and discussed status Diffusely volume overloaded continues  07/16 0701 - 07/17 0700 In: 2663.8 [I.V.:2013.8; NG/GT:650] Out: 3620 [Urine:620; Emesis/NG output:2000; Stool:1000]  Filed Weights   10/18/22 1952 10/19/22 0447 10/20/22 0500  Weight: 133 kg 130.1 kg 129.7 kg    Scheduled Meds:  heparin sodium (porcine)       amLODipine  10 mg Per Tube Daily   arformoterol  15 mcg Nebulization BID   ARIPiprazole  30 mg Per Tube Daily   artificial tears  1 Application Both Eyes Q8H   atorvastatin  10 mg Per Tube Daily   buPROPion  225 mg Per Tube BID   clonazePAM  1 mg Per Tube BID   cloNIDine  0.3 mg Per Tube TID   famotidine  20 mg Per Tube Daily   fentaNYL (SUBLIMAZE) injection  25 mcg Intravenous Once   folic acid  1 mg Per Tube Daily   guaiFENesin  400 mg Per Tube Q4H   heparin  5,000 Units Subcutaneous Q8H   hydrALAZINE  50 mg Per Tube Q8H   hydrOXYzine  25 mg Per Tube QHS   labetalol  400 mg Per Tube TID   lactulose  30 g Per Tube TID   loratadine  10 mg Per Tube Daily   metoCLOPramide (REGLAN) injection  10 mg Intravenous Q6H   montelukast  10 mg Per Tube QHS   multivitamin with minerals  1 tablet Per Tube Daily   mycophenolate  1,000 mg Per Tube BID   mouth rinse  15 mL Mouth Rinse Q2H   oxyCODONE  10 mg Per Tube Q6H   polyethylene glycol  17 g Per Tube BID   prazosin  1 mg Per Tube QHS   predniSONE  5 mg Per Tube Q breakfast   revefenacin  175 mcg Nebulization Daily   senna-docusate  1 tablet Per Tube BID   sulfamethoxazole-trimethoprim  1 tablet Per Tube Once per day on Monday Wednesday Friday   tacrolimus  1 mg Sublingual BID   Continuous Infusions:  sodium chloride 10 mL/hr at 10/20/22 0600   anticoagulant sodium citrate     clevidipine 8  mg/hr (10/20/22 0600)   dextrose 5% lactated ringers 50 mL/hr at 10/20/22 0600   feeding supplement (VITAL AF 1.2 CAL) Stopped (10/18/22 2300)   fentaNYL infusion INTRAVENOUS 100 mcg/hr (10/20/22 0600)   insulin 1.6 Units/hr (10/20/22 0600)   midazolam Stopped (10/19/22 1458)   PRN Meds:.heparin sodium (porcine), sodium chloride, acetaminophen, albuterol, alteplase, anticoagulant sodium citrate, dextrose, fentaNYL, guaiFENesin, heparin, hydrALAZINE, labetalol, midazolam, midazolam, mouth rinse, oxyCODONE, vecuronium  Current Labs: reviewed today  Physical Exam:  Blood pressure (!) 166/71, pulse 92, temperature 98.8 F (37.1 C), resp. rate (!) 27, height 5\' 6"  (1.676 m), weight 129.7 kg, SpO2 98%. Intubated, sedated  A AKI on CKD3a in setting of ddKT 2018 (BL SCr 1.4-1.6) baseline IS Tac/MMF/pred Started HD 7/15 for refractory hypervolemia and hyperkalemia Using R internal jugular Temp HD cath placed 7/15 by CCM Hopefully this is a short term of RRT Azotemia, as above; BUN remains > 100; labs consistent with very little GFR Hyperkalemia, persistent, trend with HD AHRF / Acute asthma exacerbation PCP excluded, off high doseTMP/SMX; ABX per CCM Inbutaed 7/10 IS, using Tac SL  1mg  BID (50% dose reduction from baseline), on cellcept now (go back to myfortic when tol PO); higher dose steriod from pulm indications; back on three times per week TMP/SMX AGMA, stable HTN: off clevipres,BPs stable on amlodipine, trend with UF Severe hypervolemia / anasarca as above  P HD today goal 4L UF.  2K bath.  Still hopeful for GFR recovery in time and supportive care Recent Tac level pending All questions answered for fiance Medication Issues; Preferred narcotic agents for pain control are hydromorphone, fentanyl, and methadone. Morphine should not be used.  Baclofen should be avoided Avoid oral sodium phosphate and magnesium citrate based laxatives / bowel preps   Patient status and daily plan  discussed with primary RN and HD RN   Sabra Heck MD 10/20/2022, 8:15 AM  Recent Labs  Lab 10/17/22 0457 10/17/22 0540 10/18/22 0440 10/18/22 0956 10/19/22 0338 10/20/22 0235  NA 126*   < > 125*  --  129* 131*  K 5.4*   < > 5.8* 6.2* 5.2* 5.2*  CL 92*  --  91*  --  92* 92*  CO2 23  --  21*  --  23 22  GLUCOSE 156*  --  142*  --  132* 134*  BUN 88*  --  121*  --  111* 133*  CREATININE 2.27*  --  3.15*  --  3.13* 3.86*  CALCIUM 7.6*  --  7.7*  --  8.3* 8.7*  PHOS 6.1*  --   --   --  8.2* 9.7*   < > = values in this interval not displayed.   Recent Labs  Lab 10/15/22 0243 10/15/22 0550 10/18/22 0440 10/19/22 0338 10/20/22 0235  WBC 13.9*   < > 23.1* 23.1* 20.5*  NEUTROABS 12.3*  --   --   --   --   HGB 8.6*   < > 8.7* 9.0* 8.9*  HCT 27.0*   < > 26.8* 27.6* 26.8*  MCV 77.8*   < > 78.1* 78.0* 77.9*  PLT 454*   < > 461* 444* 422*   < > = values in this interval not displayed.

## 2022-10-20 NOTE — Procedures (Signed)
Intubation Procedure Note  Linda Ellison  [id number]  11-30-1975  Date:10/20/22  Time:3:53 PM   Provider Performing:Therron Sells A Day Greb    Procedure: Intubation (31500)  Indication(s) Respiratory Failure  Consent Verbal consent from patient and significant other   Anesthesia Etomidate, Versed, and Fentanyl Tenoret Amidate, 100 of fentanyl, 1 of Versed  Time Out Verified patient identification, verified procedure, site/side was marked, verified correct patient position, special equipment/implants available, medications/allergies/relevant history reviewed, required imaging and test results available.   Sterile Technique Usual hand hygeine, masks, and gloves were used   Procedure Description Patient positioned in bed supine.  Sedation given as noted above.  Patient was intubated with endotracheal tube using Glidescope.  View was Grade 1 full glottis .  Number of attempts was 1.  Colorimetric CO2 detector was consistent with tracheal placement.   Complications/Tolerance None; patient tolerated the procedure well. X-ray not needed for verification, bronchoscopy was done immediately after intubation   EBL None   Specimen(s) None

## 2022-10-21 ENCOUNTER — Inpatient Hospital Stay (HOSPITAL_COMMUNITY): Payer: Medicare (Managed Care)

## 2022-10-21 DIAGNOSIS — J189 Pneumonia, unspecified organism: Secondary | ICD-10-CM | POA: Diagnosis not present

## 2022-10-21 LAB — CBC
HCT: 28.1 % — ABNORMAL LOW (ref 36.0–46.0)
Hemoglobin: 8.9 g/dL — ABNORMAL LOW (ref 12.0–15.0)
MCH: 25.1 pg — ABNORMAL LOW (ref 26.0–34.0)
MCHC: 31.7 g/dL (ref 30.0–36.0)
MCV: 79.2 fL — ABNORMAL LOW (ref 80.0–100.0)
Platelets: 389 10*3/uL (ref 150–400)
RBC: 3.55 MIL/uL — ABNORMAL LOW (ref 3.87–5.11)
RDW: 16.3 % — ABNORMAL HIGH (ref 11.5–15.5)
WBC: 17.3 10*3/uL — ABNORMAL HIGH (ref 4.0–10.5)
nRBC: 1 % — ABNORMAL HIGH (ref 0.0–0.2)

## 2022-10-21 LAB — GLUCOSE, CAPILLARY
Glucose-Capillary: 208 mg/dL — ABNORMAL HIGH (ref 70–99)
Glucose-Capillary: 207 mg/dL — ABNORMAL HIGH (ref 70–99)
Glucose-Capillary: 199 mg/dL — ABNORMAL HIGH (ref 70–99)
Glucose-Capillary: 157 mg/dL — ABNORMAL HIGH (ref 70–99)
Glucose-Capillary: 157 mg/dL — ABNORMAL HIGH (ref 70–99)
Glucose-Capillary: 177 mg/dL — ABNORMAL HIGH (ref 70–99)

## 2022-10-21 LAB — BASIC METABOLIC PANEL
Anion gap: 15 (ref 5–15)
BUN: 103 mg/dL — ABNORMAL HIGH (ref 6–20)
CO2: 22 mmol/L (ref 22–32)
Calcium: 8.5 mg/dL — ABNORMAL LOW (ref 8.9–10.3)
Chloride: 93 mmol/L — ABNORMAL LOW (ref 98–111)
Creatinine, Ser: 3.73 mg/dL — ABNORMAL HIGH (ref 0.44–1.00)
GFR, Estimated: 14 mL/min — ABNORMAL LOW (ref >60)
Glucose, Bld: 210 mg/dL — ABNORMAL HIGH (ref 70–99)
Potassium: 4.1 mmol/L (ref 3.5–5.1)
Sodium: 130 mmol/L — ABNORMAL LOW (ref 135–145)

## 2022-10-21 LAB — TACROLIMUS LEVEL: Tacrolimus (FK506) - LabCorp: 1.7 ng/mL — ABNORMAL LOW (ref 2.0–20.0)

## 2022-10-21 MED ORDER — INSULIN ASPART 100 UNIT/ML IJ SOLN
0.0000 [IU] | INTRAMUSCULAR | Status: DC
  Administered 2022-10-21 (×3): 3 [IU] via SUBCUTANEOUS
  Administered 2022-10-22: 2 [IU] via SUBCUTANEOUS
  Administered 2022-10-22: 5 [IU] via SUBCUTANEOUS
  Administered 2022-10-22: 3 [IU] via SUBCUTANEOUS
  Administered 2022-10-22: 5 [IU] via SUBCUTANEOUS
  Administered 2022-10-22 – 2022-10-23 (×5): 2 [IU] via SUBCUTANEOUS
  Administered 2022-10-23: 3 [IU] via SUBCUTANEOUS
  Administered 2022-10-24: 5 [IU] via SUBCUTANEOUS
  Administered 2022-10-24: 3 [IU] via SUBCUTANEOUS

## 2022-10-21 MED ORDER — ACETYLCYSTEINE 20 % IN SOLN
4.0000 mL | RESPIRATORY_TRACT | Status: AC
  Administered 2022-10-21: 4 mL via RESPIRATORY_TRACT
  Filled 2022-10-21: qty 4

## 2022-10-21 MED ORDER — FENTANYL CITRATE PF 50 MCG/ML IJ SOSY
PREFILLED_SYRINGE | INTRAMUSCULAR | Status: AC
  Filled 2022-10-21: qty 2

## 2022-10-21 MED ORDER — ACETAMINOPHEN 10 MG/ML IV SOLN: 1000.0000 mg | Freq: Three times a day (TID) | INTRAVENOUS | Status: AC | PRN

## 2022-10-21 MED ORDER — INSULIN DETEMIR 100 UNIT/ML ~~LOC~~ SOLN
8.0000 [IU] | Freq: Every day | SUBCUTANEOUS | Status: DC
  Administered 2022-10-21 – 2022-10-27 (×7): 8 [IU] via SUBCUTANEOUS
  Filled 2022-10-21 (×8): qty 0.08

## 2022-10-21 MED ORDER — OXYCODONE HCL 5 MG PO TABS
5.0000 mg | ORAL_TABLET | Freq: Four times a day (QID) | ORAL | Status: DC
  Administered 2022-10-21 (×2): 5 mg
  Filled 2022-10-21 (×3): qty 1

## 2022-10-21 MED ORDER — INSULIN ASPART 100 UNIT/ML IJ SOLN
1.0000 [IU] | INTRAMUSCULAR | Status: DC
  Administered 2022-10-21: 3 [IU] via SUBCUTANEOUS
  Administered 2022-10-21: 2 [IU] via SUBCUTANEOUS
  Administered 2022-10-21: 3 [IU] via SUBCUTANEOUS

## 2022-10-21 MED ORDER — CLONAZEPAM 0.5 MG PO TABS
0.5000 mg | ORAL_TABLET | Freq: Two times a day (BID) | ORAL | Status: DC
  Administered 2022-10-21: 0.5 mg
  Filled 2022-10-21 (×2): qty 1

## 2022-10-21 MED ORDER — METOPROLOL TARTRATE 5 MG/5ML IV SOLN
5.0000 mg | Freq: Four times a day (QID) | INTRAVENOUS | Status: DC
  Administered 2022-10-21 – 2022-10-24 (×11): 5 mg via INTRAVENOUS
  Filled 2022-10-21 (×12): qty 5

## 2022-10-21 NOTE — Procedures (Signed)
Bronchoscopy Procedure Note  LYN JOENS  [id number]  08/25/1975  Date:10/21/22  Time:9:25 AM   Provider Performing:Katrisha Segall A Onica Davidovich   Procedure(s):  Flexible Bronchoscopy (01027)  Indication(s) Mucus plugging  Consent Unable to obtain consent due to emergent nature of procedure.  Anesthesia 100 fentanyl, 2 mg versed   Time Out Verified patient identification, verified procedure, site/side was marked, verified correct patient position, special equipment/implants available, medications/allergies/relevant history reviewed, required imaging and test results available.   Sterile Technique Usual hand hygiene, masks, gowns, and gloves were used   Procedure Description Bronchoscope advanced through endotracheal tube and into airway.  Airways were examined down to subsegmental level with findings noted below.   Following diagnostic evaluation, BAL(s) performed in 50 with normal saline and return of 20 fluid  Findings: mucus plugs removed, multiple times. Multiple aliquots of saline instilled into the airway and suctioned effectively  4 cc of 20% Mucomyst mixed with 30 cc saline instilled into bilateral airways and suctioned effectively   Complications/Tolerance None; patient tolerated the procedure well. Chest X-ray is not needed post procedure.   EBL Minimal   Specimen(s) Bronchoalveolar lavage right lower lobe sent for Gram stain and cultures

## 2022-10-21 NOTE — Progress Notes (Signed)
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 confusing 0.3L UOP, cont to go down Following s ome commands  07/17 0701 - 07/18 0700 In: 2087.4 [I.V.:1582; NG/GT:505.3] Out: 5315 [Urine:315; Emesis/NG output:900; Stool:100]  Filed Weights   10/19/22 0447 10/20/22 0500 10/21/22 0317  Weight: 130.1 kg 129.7 kg 124.3 kg    Scheduled Meds:  amLODipine  10 mg Per Tube Daily   arformoterol  15 mcg Nebulization BID   ARIPiprazole  30 mg Per Tube Daily   atorvastatin  10 mg Per Tube Daily   buPROPion  225 mg Per Tube BID   clonazePAM  1 mg Per Tube BID   cloNIDine  0.3 mg Per Tube TID   famotidine  20 mg Per Tube Daily   folic acid  1 mg Per Tube Daily   guaiFENesin  400 mg Per Tube Q4H   heparin  5,000 Units Subcutaneous Q8H   hydrALAZINE  50 mg Per Tube Q8H   hydrOXYzine  25 mg Per Tube QHS   insulin aspart  1-3 Units Subcutaneous Q4H   insulin detemir  8 Units Subcutaneous Daily   labetalol  400 mg Per Tube TID   lactulose  20 g Per Tube BID   loratadine  10 mg Per Tube Daily   metoCLOPramide (REGLAN) injection  10 mg Intravenous Q6H   montelukast  10 mg Per Tube QHS   multivitamin with minerals  1 tablet Per Tube Daily   mycophenolate  1,000 mg Per Tube BID   mouth rinse  15 mL Mouth Rinse Q2H   oxyCODONE  10 mg Per Tube Q6H   polyethylene glycol  17 g Per Tube BID   prazosin  1 mg Per Tube QHS   predniSONE  5 mg Per Tube Q breakfast   revefenacin  175 mcg Nebulization Daily   senna-docusate  1 tablet Per Tube BID   sulfamethoxazole-trimethoprim  1 tablet Per Tube Once per day on Monday Wednesday Friday   tacrolimus  1 mg Sublingual BID   Continuous Infusions:  sodium chloride 10 mL/hr at 10/21/22 0600   anticoagulant sodium citrate     clevidipine 8 mg/hr (10/21/22 0600)   feeding supplement (VITAL AF 1.2  CAL) Stopped (10/21/22 0630)   fentaNYL infusion INTRAVENOUS Stopped (10/20/22 1601)   PRN Meds:.sodium chloride, acetaminophen, albuterol, alteplase, anticoagulant sodium citrate, fentaNYL, guaiFENesin, heparin, hydrALAZINE, labetalol, midazolam, mouth rinse, oxyCODONE  Current Labs: reviewed today  Physical Exam:  Blood pressure (!) 151/65, pulse 88, temperature 99.5 F (37.5 C), resp. rate (!) 28, height 5\' 6"  (1.676 m), weight 124.3 kg, SpO2 100%. Intubated, sedated  A AKI on CKD3a in setting of ddKT 2018 (BL SCr 1.4-1.6) baseline IS Tac/MMF/pred Started HD 7/15 for refractory hypervolemia and hyperkalemia Using R internal jugular Temp HD cath placed 7/15 by CCM Hopefully this is a short term of RRT, no clear e/o GFR recovery to date Azotemia, as above; BUN remains > 100 (steroids contributing); labs consistent with very little GFR Hyperkalemia, persistent, trend with HD AHRF / Acute asthma exacerbation PCP excluded, off high doseTMP/SMX; ABX per CCM Intubated 7/10 IS, using Tac SL 1mg  BID (50% dose reduction from baseline), on cellcept now (go back to myfortic when tol PO); higher dose steriod from pulm indications; back on three times per week  TMP/SMX.  Most recent tac level hard to understand. Cont current dosing.  Rpt value in process AGMA, stable HTN: off clevipres,BPs stable on amlodipine, trend with UF Severe hypervolemia / anasarca as above  P HD tomorrow goal 4L UF.  2K bath. No heparin Still hopeful for GFR recovery in time and supportive care Recent Tac level pending All questions answered for fiance Medication Issues; Preferred narcotic agents for pain control are hydromorphone, fentanyl, and methadone. Morphine should not be used.  Baclofen should be avoided Avoid oral sodium phosphate and magnesium citrate based laxatives / bowel preps   Patient status and daily plan discussed with primary RN, primary MD  Sabra Heck MD 10/21/2022, 7:34 AM  Recent Labs  Lab  10/17/22 0457 10/17/22 0540 10/19/22 0338 10/20/22 0235 10/21/22 0313  NA 126*   < > 129* 131* 130*  K 5.4*   < > 5.2* 5.2* 4.1  CL 92*   < > 92* 92* 93*  CO2 23   < > 23 22 22   GLUCOSE 156*   < > 132* 134* 210*  BUN 88*   < > 111* 133* 103*  CREATININE 2.27*   < > 3.13* 3.86* 3.73*  CALCIUM 7.6*   < > 8.3* 8.7* 8.5*  PHOS 6.1*  --  8.2* 9.7*  --    < > = values in this interval not displayed.   Recent Labs  Lab 10/15/22 0243 10/15/22 0550 10/19/22 0338 10/20/22 0235 10/21/22 0313  WBC 13.9*   < > 23.1* 20.5* 17.3*  NEUTROABS 12.3*  --   --   --   --   HGB 8.6*   < > 9.0* 8.9* 8.9*  HCT 27.0*   < > 27.6* 26.8* 28.1*  MCV 77.8*   < > 78.0* 77.9* 79.2*  PLT 454*   < > 444* 422* 389   < > = values in this interval not displayed.

## 2022-10-21 NOTE — Progress Notes (Signed)
NAME:  Linda Ellison, MRN:  [id number], DOB:  11/25/1975, LOS: 11 ADMISSION DATE:  10/09/2022, CONSULTATION DATE: 10/12/2022 REFERRING MD: [facility], CHIEF COMPLAINT: Respiratory distress  History of Present Illness:  47 year old female with extensive past medical history is well-documented below who was admitted 2 days prior to this consult with multifocal pneumonia.  She has had increasing work of breathing with a respiratory rate of 46.  She had a CT scan that demonstrates bilateral multifocal pneumonia, elevated BNP, and increased work of breathing that will require her being transferred to the intensive care unit.  She has noted a nonproductive cough prior to admission.  Increasing shortness of breath prior to admission.  And increasing lower extremity edema.  She is status post cadaveric renal transplant and she is followed by nephrology.  Currently she is hypokalemic with creatinine in the 2.2 range.  Due to her increasing work of breathing impending respiratory failure she will be transferred to intensive care unit for further evaluation and treatment.  She has already been started on aggressive diuresis and steroids and empirical antimicrobial therapy.  Pertinent  Medical History   Past Medical History:  Diagnosis Date   Allergy    Anemia associated with chronic renal failure    Anxiety    Asthma    Atypical chest pain    long-standing -- normal cardio cath 06-27-2012 and normal nuclear stress test 06-25-2016   AV (arteriovenous fistula) (HCC)    for dialysis-- currently located left radiocephalic   CAD (coronary artery disease) cardiologist-- dr Sharol Roussel [facility] cardiology in [place])   a. False positive stress echo 06/2012 at [facility] - cath with no obstructive CAD at [facility] (mild luminal irregularities in LAD, moderate diffuse disease in distal RCA).   Depression    Elevated lipids    ESRD on hemodialysis [facility]) NEPHROLOGIST-  DR MATTINGLY   started dialysis 01/29/11:  Foy[facility] Spurling [facility] on MWF   Gastroparesis    GERD (gastroesophageal reflux disease)    History of pneumonia    HCAP 04/ 2017   Hyperlipidemia    Hyperthyroidism    Menorrhagia    Other secondary hypertension    associated to diabetes -- followed by cardiologist ( dr Sharol Roussel)     Peripheral neuropathy    PONV (postoperative nausea and vomiting)    Pre-transplant evaluation for kidney transplant    [facility]   Renal failure syndrome 03/05/2022   Secondary hyperparathyroidism of renal origin [facility])    Sleep apnea    Type 2 diabetes mellitus, with long-term current use of insulin (HCC)    dx 1985   Significant Hospital Events: Including procedures, antibiotic start and stop dates in addition to other pertinent events   07/09 Transferred to ICU  07/10: Intubated  7/13-improving O2 requirement 7/15 worsening renal function, fluid overloaded 7/15-tolerated hemodialysis  7/17-endotracheal tube was exchanged for mucous plugging 7/18-emergent bronchoscopy for mucous plugging  Interim History / Subjective:  Mucous plugging noted this morning, required bagging off the vent and bronchoscopy Was able to respond to name calling, was able to squeeze hands bilateral upper extremities, did not wiggle toes  Objective   Blood pressure (!) 151/65, pulse 88, temperature 99.5 F (37.5 C), resp. rate (!) 28, height 5\' 6"  (1.676 m), weight 124.3 kg, SpO2 100%.      Vent Mode: PRVC FiO2 (%):  [40 %] 40 % Set Rate:  [24 bmp] 24 bmp Vt Set:  [470 mL] 470 mL PEEP:  [  5 cmH20] 5 cmH20 Plateau Pressure:  [22 cmH20-29 cmH20] 24 cmH20   Intake/Output Summary (Last 24 hours) at 10/21/2022 0752 Last data filed at 10/21/2022 0600 Gross per 24 hour  Intake 2087.37 ml  Output 5315 ml  Net -3227.63 ml   Filed Weights   10/19/22 0447 10/20/22 0500 10/21/22 0317  Weight: 130.1 kg 129.7 kg 124.3 kg    Examination: General: Middle-age, does not appear to be  in distress Opens eyes to name calling HENT: Moist oral mucosa, endotracheal tube in place Lungs: Coarse breath sounds bilaterally Cardiovascular: S1-S2 appreciated with no murmur Abdomen: Full, bowel sounds appreciated, hypoactive Extremities: Anasarca Neuro: Intubated and sedated  Labs reviewed  BAL culture-no organisms PJP PCR negative  Ventilator requirements stable -Rate of 24, tidal volume 470, PEEP of 5, FiO2 40%  Resolved Hospital Problem list   ARDS-result  Assessment & Plan:   Mucous plugging -S/p bronchoscopy that revealed multiple plugs -Aliquots of Mucomyst instilled into the airway and suctioned effectively -No significant inflammation noted in the airway  Acute hypoxemic respiratory failure Multilobar pneumonia ARDS-resolved Negative testing for PJP pneumonia -On Bactrim PJP prophylaxis -Completed course of Zosyn -Oxygen requirement remains low  Receiving dialysis for anasarca -Have been able to pull off about 3 L for the last 2 days of dialysis -Being considered for possible dialysis again today  Leukocytosis -Felt related to steroids -Now on home dose of prednisone at 5 mg  Acute hypoxemic respiratory failure -Continue mechanical ventilation -Target TVol 6-8cc/kgIBW -Target Plateau Pressure < 30cm H20 -Target driving pressure less than 15 cm of water -Target PaO2 55-65: titrate PEEP/FiO2 per protocol -Ventilator associated pneumonia prevention protocol  Sedation and agitation management -We have managed to wean off fentanyl and Versed -Receiving enteral oxycodone and Klonopin  Gastroparesis -Did have an episode of vomiting overnight and with feeding tube to intermittent suction, hide over 700 cc suctioned -KUB with significant gaseous pattern suggesting ileus -Please order for cortrak postpyloric to see whether we can feed fer  History of renal transplant secondary to acute renal failure S/p cadaveric renal transplantation on  immunosuppressants -Tacrolimus toxicity -Continue tacrolimus, continue CellCept  Acute kidney injury on chronic kidney disease stage IIIa -Appreciate renal follow-up -Avoid nephrotoxic medications -On dialysis  Type 1 diabetes -Was on insulin infusion -Transition to subcu  Difficult to control hypertension -On labetalol, clonidine, hydralazine added -Remains difficult to wean off Cleviprex  Anemia of chronic illness -Continue to trend  Respiratory status remained stable Day 11 of being on the ventilator Still reasonable to give her chance at extubation however have to stat discussions about possible need for tracheostomy  Hopefully with more fluid removal with dialysis, may be able to extubate  best Practice (right click and "Reselect all SmartList Selections" daily)   Diet/type: tubefeeds-on hold at present secondary to ileus DVT prophylaxis: prophylactic heparin  GI prophylaxis: PPI Lines: N/A Foley:  N/A Code Status:  full code Last date of multidisciplinary goals of care discussion [discussed with significant other at bedside 7/18,   Labs   CBC: Recent Labs  Lab 10/15/22 0243 10/15/22 0550 10/17/22 0457 10/17/22 0540 10/18/22 0440 10/19/22 0338 10/20/22 0235 10/21/22 0313  WBC 13.9*   < > 15.1*  --  23.1* 23.1* 20.5* 17.3*  NEUTROABS 12.3*  --   --   --   --   --   --   --   HGB 8.6*   < > 8.8* 9.2* 8.7* 9.0* 8.9* 8.9*  HCT 27.0*   < >  27.1* 27.0* 26.8* 27.6* 26.8* 28.1*  MCV 77.8*   < > 79.5*  --  78.1* 78.0* 77.9* 79.2*  PLT 454*   < > 466*  --  461* 444* 422* 389   < > = values in this interval not displayed.    Basic Metabolic Panel: Recent Labs  Lab 10/14/22 1422 10/14/22 1431 10/15/22 0243 10/15/22 0550 10/17/22 0457 10/17/22 0540 10/18/22 0440 10/18/22 0956 10/19/22 0338 10/20/22 0235 10/21/22 0313  NA 131*   < > 130*   < > 126* 126* 125*  --  129* 131* 130*  K 4.4   < > 4.9   < > 5.4* 5.5* 5.8* 6.2* 5.2* 5.2* 4.1  CL 96*  --  93*    < > 92*  --  91*  --  92* 92* 93*  CO2 21*  --  19*   < > 23  --  21*  --  23 22 22   GLUCOSE 202*  --  194*   < > 156*  --  142*  --  132* 134* 210*  BUN 79*  --  78*   < > 88*  --  121*  --  111* 133* 103*  CREATININE 2.20*  --  2.23*   < > 2.27*  --  3.15*  --  3.13* 3.86* 3.73*  CALCIUM 7.6*  --  7.3*   < > 7.6*  --  7.7*  --  8.3* 8.7* 8.5*  MG 1.7  --  1.9  --  2.4  --   --   --   --   --   --   PHOS 6.1*  --  6.1*  --  6.1*  --   --   --  8.2* 9.7*  --    < > = values in this interval not displayed.   GFR: Estimated Creatinine Clearance: 25.4 mL/min (A) (by C-G formula based on SCr of 3.73 mg/dL (H)). Recent Labs  Lab 10/14/22 1422 10/14/22 1707 10/15/22 0243 10/18/22 0440 10/19/22 0338 10/20/22 0235 10/21/22 0313  WBC  --   --    < > 23.1* 23.1* 20.5* 17.3*  LATICACIDVEN 1.4 1.1  --   --   --   --   --    < > = values in this interval not displayed.    Liver Function Tests: Recent Labs  Lab 10/19/22 0338 10/20/22 0235  ALBUMIN 2.6* 2.7*   No results for input(s): "LIPASE", "AMYLASE" in the last 168 hours. No results for input(s): "AMMONIA" in the last 168 hours.  ABG    Component Value Date/Time   PHART 7.323 (L) 10/17/2022 0540   PCO2ART 47.4 10/17/2022 0540   PO2ART 70 (L) 10/17/2022 0540   HCO3 24.7 10/17/2022 0540   TCO2 26 10/17/2022 0540   ACIDBASEDEF 2.0 10/17/2022 0540   O2SAT 93 10/17/2022 0540     Coagulation Profile: No results for input(s): "INR", "PROTIME" in the last 168 hours.  Cardiac Enzymes: No results for input(s): "CKTOTAL", "CKMB", "CKMBINDEX", "TROPONINI" in the last 168 hours.  HbA1C: Hgb A1c MFr Bld  Date/Time Value Ref Range Status  08/27/2022 03:43 AM 6.7 (H) 4.8 - 5.6 % Final    Comment:    (NOTE)         Prediabetes: 5.7 - 6.4         Diabetes: >6.4         Glycemic control for adults with diabetes: <7.0   11/21/2017 06:49 PM 9.9 (  H) 4.8 - 5.6 % Final    Comment:    (NOTE) Pre diabetes:          5.7%-6.4% Diabetes:               >6.4% Glycemic control for   <7.0% adults with diabetes     CBG: Recent Labs  Lab 10/20/22 1538 10/20/22 1937 10/20/22 2323 10/21/22 0325 10/21/22 0738  GLUCAP 144* 160* 206* 208* 207*    Review of Systems:   10 point review of system taken, please see HPI for positives and negatives.   Past Medical History:  She,  has a past medical history of Allergy, Anemia associated with chronic renal failure, Anxiety, Asthma, Atypical chest pain, AV (arteriovenous fistula) (HCC), CAD (coronary artery disease) (cardiologist-- dr Sharol Roussel [facility] [place] cardiology in [place])), Depression, Elevated lipids, ESRD on hemodialysis (HCC) (NEPHROLOGIST-  DR MATTINGLY), Gastroparesis, GERD (gastroesophageal reflux disease), History of pneumonia, Hyperlipidemia, Hyperthyroidism, Menorrhagia, Other secondary hypertension, Peripheral neuropathy, PONV (postoperative nausea and vomiting), Pre-transplant evaluation for kidney transplant, Renal failure syndrome (03/05/2022), Secondary hyperparathyroidism of renal origin [facility]), Sleep apnea, and Type 2 diabetes mellitus, with long-term current use of insulin (HCC).   Surgical History:   Past Surgical History:  Procedure Laterality Date   AV FISTULA PLACEMENT  08/2010   Left radiocephalic AVF   AV FISTULA PLACEMENT Right 05/07/2014   Procedure: ARTERIOVENOUS (AV) FISTULA CREATION;  Surgeon: Sherren Kerns, MD;  [facility]: [facility] OR;  Service: Vascular;  Laterality: Right;   AV FISTULA PLACEMENT Right 07/02/2014   Procedure: INSERTION OF ARTERIOVENOUS (AV) GORE-TEX GRAFT ARM;  Surgeon: Sherren Kerns, MD;  Location: [facility] OR;  Service: Vascular;  Laterality: Right;   CARDIOVASCULAR STRESS TEST  06/25/2016   [facility]   normal nuclear perfusion study w/ no ischemia/  normal LV function and wall motion , ef 51%   CESAREAN SECTION  03/22/2001   w/ Bilateral Tubal Ligation   COLONOSCOPY     COLONOSCOPY WITH ESOPHAGOGASTRODUODENOSCOPY (EGD)  10/19/2011    DILATION AND CURETTAGE OF UTERUS  05/12/2000   w/ suction for missed ab   DOBUTAMINE STRESS ECHO  06/04/2016    [facility]   normal stress echo w/ no chest pain or ischemia/  normal LV function and wall motion , stress ef 60-65%   ESOPHAGOGASTRODUODENOSCOPY  10/19/2011   Dr. Stan Head   FEMUR IM NAIL Left child   removed   FOOT SURGERY Left 2014 approx.   HYSTEROSCOPY WITH NOVASURE N/A 08/19/2016   Procedure: HYSTEROSCOPY WITH NOVASURE;  Surgeon: Huel Cote, MD;  Location: [facility];  Service: Gynecology;  Laterality: N/A;   KIDNEY TRANSPLANT  09/18/2016   LAPAROSCOPIC CHOLECYSTECTOMY  1994   LEFT HEART CATHETERIZATION WITH CORONARY ANGIOGRAM N/A 06/27/2012   Procedure: LEFT HEART CATHETERIZATION WITH CORONARY ANGIOGRAM;  Surgeon: Laurey Morale, MD;  Location: [facility] CATH LAB;  Service: Cardiovascular;  Laterality: N/A; No ostructive CAD, normal LVF, ef 55-60% (mild LAD luminal irregularities, moderate dRCA diffuse disease)   LIGATION GORETEX FISTULA  01/04/11   Left AVF   LIGATION OF ARTERIOVENOUS  FISTULA Left 08/21/2014   Procedure: LIGATION OF ARTERIOVENOUS  FISTULA;  Surgeon: Annice Needy, MD;  Location: [facility] ORS;  Service: Vascular;  Laterality: Left;   LIGATION OF ARTERIOVENOUS  FISTULA Left 06/29/2017   Procedure: LIGATION OF ARTERIOVENOUS  FISTULA ( RADIOCEPHALIC );  Surgeon: Annice Needy, MD;  Location: [facility] ORS;  Service: Vascular;  Laterality: Left;   NEPHRECTOMY TRANSPLANTED ORGAN  PERIPHERAL VASCULAR CATHETERIZATION Left 08/08/2014   Procedure: Upper Extremity Angiography;  Surgeon: Annice Needy, MD;  Location: [facility] INVASIVE CV LAB;  Service: Cardiovascular;  Laterality: Left;   PERIPHERAL VASCULAR CATHETERIZATION Left 08/08/2014   Procedure: Upper Extremity Intervention;  Surgeon: Annice Needy, MD;  Location: [facility] INVASIVE CV LAB;  Service: Cardiovascular;  Laterality: Left;   PERIPHERAL VASCULAR CATHETERIZATION Left 03/08/2016   Procedure: A/V Fistulagram;   Surgeon: Annice Needy, MD;  Location: [facility] INVASIVE CV LAB;  Service: Cardiovascular;  Laterality: Left;   POLYPECTOMY     THROMBECTOMY AND REVISION OF ARTERIOVENTOUS (AV) GORETEX  GRAFT Right 07/16/2014   Procedure: THROMBECTOMY  Right  arm  ARTERIOVENOUS  GORETEX  GRAFT;  Surgeon: Sherren Kerns, MD;  Location: [facility] OR;  Service: Vascular;  Laterality: Right;   TRANSTHORACIC ECHOCARDIOGRAM  06/04/2016   mild concentric LVH, ef 55-60%,  mild TR,  borderline LAE,  trivial MR and PR     Social History:   reports that she has never smoked. She has never used smokeless tobacco. She reports that she does not drink alcohol and does not use drugs.   Family History:  Her family history includes Arthritis in her brother; Diabetes in her father, maternal aunt, and paternal aunt; Heart disease in her brother, mother, and paternal aunt; Hypertension in her father, maternal aunt, and paternal aunt; Kidney disease in her maternal grandmother and mother; Kidney failure in her paternal aunt and paternal uncle. There is no history of Colon cancer, Colon polyps, Crohn's disease, Esophageal cancer, Rectal cancer, Stomach cancer, or Ulcerative colitis.   Allergies Allergies  Allergen Reactions   Ciprofloxacin Hives, Itching and Nausea And Vomiting   Fluocinolone Itching, Nausea And Vomiting and Other (See Comments)   Hydromorphone Hives   Strawberry Extract Itching and Other (See Comments)    Pt states that this medication makes her tongue raw.   No reaction   Hydromorphone Hcl Hives   Phenytoin Sodium Extended Itching   Adhesive [Tape] Itching and Rash   Chlorhexidine Gluconate Itching   Clindamycin Diarrhea, Nausea And Vomiting and Rash   Shrimp [Shellfish Allergy] Rash   The patient is critically ill with multiple organ systems failure and requires high complexity decision making for assessment and support, frequent evaluation and titration of therapies, application of advanced monitoring technologies and  extensive interpretation of multiple databases. Critical Care Time devoted to patient care services described in this note independent of APP/resident time (if applicable)  is 40 minutes.   Virl Diamond MD [facility] Personal pager: See Amion If unanswered, please page CCM On-call: #[phone]

## 2022-10-22 ENCOUNTER — Inpatient Hospital Stay (HOSPITAL_COMMUNITY): Payer: Medicare (Managed Care)

## 2022-10-22 DIAGNOSIS — J80 Acute respiratory distress syndrome: Secondary | ICD-10-CM

## 2022-10-22 DIAGNOSIS — J189 Pneumonia, unspecified organism: Secondary | ICD-10-CM | POA: Diagnosis not present

## 2022-10-22 LAB — CBC WITH DIFFERENTIAL/PLATELET
Abs Immature Granulocytes: 0.35 10*3/uL — ABNORMAL HIGH (ref 0.00–0.07)
Basophils Absolute: 0 10*3/uL (ref 0.0–0.1)
Basophils Relative: 0 %
Eosinophils Absolute: 0.1 10*3/uL (ref 0.0–0.5)
Eosinophils Relative: 0 %
HCT: 26.8 % — ABNORMAL LOW (ref 36.0–46.0)
Hemoglobin: 8.4 g/dL — ABNORMAL LOW (ref 12.0–15.0)
Immature Granulocytes: 2 %
Lymphocytes Relative: 3 %
Lymphs Abs: 0.5 10*3/uL — ABNORMAL LOW (ref 0.7–4.0)
MCH: 25.6 pg — ABNORMAL LOW (ref 26.0–34.0)
MCHC: 31.3 g/dL (ref 30.0–36.0)
MCV: 81.7 fL (ref 80.0–100.0)
Monocytes Absolute: 1.2 10*3/uL — ABNORMAL HIGH (ref 0.1–1.0)
Monocytes Relative: 6 %
Neutro Abs: 17.6 10*3/uL — ABNORMAL HIGH (ref 1.7–7.7)
Neutrophils Relative %: 89 %
Platelets: 321 10*3/uL (ref 150–400)
RBC: 3.28 MIL/uL — ABNORMAL LOW (ref 3.87–5.11)
RDW: 16.6 % — ABNORMAL HIGH (ref 11.5–15.5)
WBC: 19.7 10*3/uL — ABNORMAL HIGH (ref 4.0–10.5)
nRBC: 0.3 % — ABNORMAL HIGH (ref 0.0–0.2)

## 2022-10-22 LAB — CULTURE, BAL-QUANTITATIVE W GRAM STAIN: Culture: 20000 — AB

## 2022-10-22 LAB — GLUCOSE, CAPILLARY
Glucose-Capillary: 177 mg/dL — ABNORMAL HIGH (ref 70–99)
Glucose-Capillary: 191 mg/dL — ABNORMAL HIGH (ref 70–99)
Glucose-Capillary: 207 mg/dL — ABNORMAL HIGH (ref 70–99)
Glucose-Capillary: 125 mg/dL — ABNORMAL HIGH (ref 70–99)
Glucose-Capillary: 102 mg/dL — ABNORMAL HIGH (ref 70–99)

## 2022-10-22 LAB — RENAL FUNCTION PANEL
Albumin: 2.4 g/dL — ABNORMAL LOW (ref 3.5–5.0)
Anion gap: 16 — ABNORMAL HIGH (ref 5–15)
BUN: 117 mg/dL — ABNORMAL HIGH (ref 6–20)
CO2: 23 mmol/L (ref 22–32)
Calcium: 8.4 mg/dL — ABNORMAL LOW (ref 8.9–10.3)
Chloride: 95 mmol/L — ABNORMAL LOW (ref 98–111)
Creatinine, Ser: 2.7 mg/dL — ABNORMAL HIGH (ref 0.44–1.00)
GFR, Estimated: 21 mL/min — ABNORMAL LOW (ref >60)
Glucose, Bld: 184 mg/dL — ABNORMAL HIGH (ref 70–99)
Phosphorus: 7.5 mg/dL — ABNORMAL HIGH (ref 2.5–4.6)
Potassium: 3.2 mmol/L — ABNORMAL LOW (ref 3.5–5.1)
Sodium: 134 mmol/L — ABNORMAL LOW (ref 135–145)

## 2022-10-22 MED ORDER — MIDAZOLAM HCL (PF) 5 MG/ML IJ SOLN: 1.0000 mg | INTRAMUSCULAR | Status: DC | PRN

## 2022-10-22 MED ORDER — SODIUM CHLORIDE 3 % IN NEBU
4.0000 mL | INHALATION_SOLUTION | Freq: Once | RESPIRATORY_TRACT | Status: AC
  Administered 2022-10-22: 4 mL via RESPIRATORY_TRACT

## 2022-10-22 MED ORDER — MIDAZOLAM HCL 2 MG/2ML IJ SOLN
1.0000 mg | INTRAMUSCULAR | Status: DC | PRN
  Administered 2022-10-22 – 2022-10-23 (×4): 2 mg via INTRAVENOUS
  Administered 2022-10-24: 4 mg via INTRAVENOUS
  Administered 2022-10-24 (×2): 2 mg via INTRAVENOUS
  Administered 2022-10-24: 4 mg via INTRAVENOUS
  Administered 2022-10-24 – 2022-10-25 (×2): 2 mg via INTRAVENOUS
  Administered 2022-10-25 (×2): 4 mg via INTRAVENOUS
  Administered 2022-10-25: 2 mg via INTRAVENOUS
  Filled 2022-10-22: qty 4
  Filled 2022-10-22 (×2): qty 2
  Filled 2022-10-22: qty 4
  Filled 2022-10-22: qty 2
  Filled 2022-10-22: qty 4
  Filled 2022-10-22 (×4): qty 2
  Filled 2022-10-22: qty 4
  Filled 2022-10-22 (×2): qty 2

## 2022-10-22 MED ORDER — SENNOSIDES-DOCUSATE SODIUM 8.6-50 MG PO TABS
1.0000 | ORAL_TABLET | Freq: Every day | ORAL | Status: DC
  Administered 2022-10-22: 1
  Filled 2022-10-22: qty 1

## 2022-10-22 MED ORDER — DEXMEDETOMIDINE HCL IN NACL 400 MCG/100ML IV SOLN
INTRAVENOUS | Status: AC
  Administered 2022-10-22: 1.2 ug/kg/h
  Filled 2022-10-22: qty 100

## 2022-10-22 MED ORDER — SODIUM CHLORIDE 3 % IN NEBU
INHALATION_SOLUTION | RESPIRATORY_TRACT | Status: AC
  Filled 2022-10-22: qty 4

## 2022-10-22 MED ORDER — FENTANYL CITRATE PF 50 MCG/ML IJ SOSY
100.0000 ug | PREFILLED_SYRINGE | Freq: Once | INTRAMUSCULAR | Status: AC
  Administered 2022-10-22: 100 ug via INTRAVENOUS

## 2022-10-22 MED ORDER — HEPARIN SODIUM (PORCINE) 1000 UNIT/ML DIALYSIS
20.0000 [IU]/kg | INTRAMUSCULAR | Status: DC | PRN
  Administered 2022-10-22: 2500 [IU] via INTRAVENOUS_CENTRAL

## 2022-10-22 MED ORDER — FENTANYL CITRATE PF 50 MCG/ML IJ SOSY
PREFILLED_SYRINGE | INTRAMUSCULAR | Status: AC
  Filled 2022-10-22: qty 2

## 2022-10-22 MED ORDER — TACROLIMUS 1 MG PO CAPS
1.5000 mg | ORAL_CAPSULE | Freq: Two times a day (BID) | ORAL | Status: DC
  Administered 2022-10-22 – 2022-10-23 (×3): 1.5 mg via SUBLINGUAL
  Filled 2022-10-22 (×4): qty 1

## 2022-10-22 MED ORDER — DEXMEDETOMIDINE HCL IN NACL 400 MCG/100ML IV SOLN
INTRAVENOUS | Status: AC
  Filled 2022-10-22: qty 100

## 2022-10-22 MED ORDER — ACETYLCYSTEINE 20 % IN SOLN
4.0000 mL | Freq: Three times a day (TID) | RESPIRATORY_TRACT | Status: AC
  Administered 2022-10-22 (×3): 4 mL via RESPIRATORY_TRACT
  Filled 2022-10-22 (×3): qty 4

## 2022-10-22 MED ORDER — FENTANYL CITRATE PF 50 MCG/ML IJ SOSY
PREFILLED_SYRINGE | INTRAMUSCULAR | Status: AC
  Administered 2022-10-22: 100 ug
  Filled 2022-10-22: qty 2

## 2022-10-22 MED ORDER — FENTANYL CITRATE PF 50 MCG/ML IJ SOSY
50.0000 ug | PREFILLED_SYRINGE | INTRAMUSCULAR | Status: DC | PRN
  Administered 2022-10-22: 50 ug via INTRAVENOUS
  Administered 2022-10-23: 100 ug via INTRAVENOUS
  Administered 2022-10-23: 50 ug via INTRAVENOUS
  Administered 2022-10-23: 100 ug via INTRAVENOUS
  Administered 2022-10-23: 50 ug via INTRAVENOUS
  Administered 2022-10-23: 200 ug via INTRAVENOUS
  Administered 2022-10-23: 100 ug via INTRAVENOUS
  Administered 2022-10-24: 200 ug via INTRAVENOUS
  Administered 2022-10-24: 100 ug via INTRAVENOUS
  Administered 2022-10-24: 200 ug via INTRAVENOUS
  Administered 2022-10-24 (×4): 100 ug via INTRAVENOUS
  Administered 2022-10-24: 50 ug via INTRAVENOUS
  Administered 2022-10-25 (×3): 100 ug via INTRAVENOUS
  Administered 2022-10-25: 200 ug via INTRAVENOUS
  Administered 2022-10-25 (×2): 100 ug via INTRAVENOUS
  Administered 2022-10-26: 50 ug via INTRAVENOUS
  Filled 2022-10-22 (×5): qty 2
  Filled 2022-10-22 (×2): qty 4
  Filled 2022-10-22: qty 1
  Filled 2022-10-22 (×2): qty 2
  Filled 2022-10-22 (×2): qty 4
  Filled 2022-10-22 (×8): qty 2
  Filled 2022-10-22: qty 1
  Filled 2022-10-22 (×3): qty 2

## 2022-10-22 MED ORDER — HYDRALAZINE HCL 50 MG PO TABS: 100.0000 mg | ORAL_TABLET | Freq: Three times a day (TID) | ORAL | Status: DC

## 2022-10-22 MED ORDER — HYDRALAZINE HCL 50 MG PO TABS
100.0000 mg | ORAL_TABLET | Freq: Three times a day (TID) | ORAL | Status: DC
  Administered 2022-10-22 – 2022-10-27 (×16): 100 mg
  Filled 2022-10-22 (×15): qty 2

## 2022-10-22 MED ORDER — POLYETHYLENE GLYCOL 3350 17 G PO PACK
17.0000 g | PACK | Freq: Every day | ORAL | Status: DC
  Administered 2022-10-22: 17 g

## 2022-10-22 MED ORDER — DEXMEDETOMIDINE HCL IN NACL 400 MCG/100ML IV SOLN
0.0000 ug/kg/h | INTRAVENOUS | Status: DC
  Administered 2022-10-22: 1.2 ug/kg/h via INTRAVENOUS
  Administered 2022-10-22: 0.4 ug/kg/h via INTRAVENOUS
  Administered 2022-10-22: 1.2 ug/kg/h via INTRAVENOUS
  Administered 2022-10-22: 1 ug/kg/h via INTRAVENOUS
  Administered 2022-10-22 – 2022-10-25 (×19): 1.2 ug/kg/h via INTRAVENOUS
  Administered 2022-10-25: 1.5 ug/kg/h via INTRAVENOUS
  Administered 2022-10-25 (×3): 1.2 ug/kg/h via INTRAVENOUS
  Administered 2022-10-25: 1.5 ug/kg/h via INTRAVENOUS
  Administered 2022-10-26: 1.501 ug/kg/h via INTRAVENOUS
  Administered 2022-10-26: 0.5 ug/kg/h via INTRAVENOUS
  Administered 2022-10-26 (×3): 1.5 ug/kg/h via INTRAVENOUS
  Administered 2022-10-26: 0.5 ug/kg/h via INTRAVENOUS
  Administered 2022-10-26: 1.5 ug/kg/h via INTRAVENOUS
  Administered 2022-10-26 – 2022-10-27 (×2): 0.5 ug/kg/h via INTRAVENOUS
  Filled 2022-10-22 (×4): qty 100
  Filled 2022-10-22: qty 200
  Filled 2022-10-22 (×2): qty 100
  Filled 2022-10-22: qty 400
  Filled 2022-10-22 (×11): qty 100
  Filled 2022-10-22: qty 200
  Filled 2022-10-22 (×9): qty 100
  Filled 2022-10-22: qty 200
  Filled 2022-10-22 (×2): qty 100

## 2022-10-22 MED ORDER — SODIUM BICARBONATE 8.4 % IV SOLN
INTRAVENOUS | Status: AC
  Filled 2022-10-22: qty 50

## 2022-10-22 NOTE — Progress Notes (Signed)
NAME:  PERCY COMP, MRN:  [id number], DOB:  [birth year], LOS: 12 ADMISSION DATE:  10/09/2022, CONSULTATION DATE: 10/12/2022 REFERRING MD: Triad, CHIEF COMPLAINT: Respiratory distress  BRIEF  47 year old female with extensive past medical history is well-documented below who was admitted 2 days prior to this consult with multifocal pneumonia.  She has had increasing work of breathing with a respiratory rate of 46.  She had a CT scan that demonstrates bilateral multifocal pneumonia, elevated BNP, and increased work of breathing that will require her being transferred to the intensive care unit.  She has noted a nonproductive cough prior to admission.  Increasing shortness of breath prior to admission.  And increasing lower extremity edema.  She is status post cadaveric renal transplant and she is followed by nephrology.  Currently she is hypokalemic with creatinine in the 2.2 range.  Due to her increasing work of breathing impending respiratory failure she will be transferred to intensive care unit for further evaluation and treatment.  She has already been started on aggressive diuresis and steroids and empirical antimicrobial therapy.  Pertinent  Medical History    has a past medical history of Allergy, Anemia associated with chronic renal failure, Anxiety, Asthma, Atypical chest pain, AV (arteriovenous fistula) (HCC), CAD (coronary artery disease) (cardiologist-- dr Sharol Roussel [facility] [place] [facility] in [place])), Depression, Elevated lipids, ESRD on hemodialysis (HCC) (NEPHROLOGIST-  DR MATTINGLY), Gastroparesis, GERD (gastroesophageal reflux disease), History of pneumonia, Hyperlipidemia, Hyperthyroidism, Menorrhagia, Other secondary hypertension, Peripheral neuropathy, PONV (postoperative nausea and vomiting), Pre-transplant evaluation for kidney transplant, Renal failure syndrome (03/05/2022), Secondary hyperparathyroidism of renal origin [facility]), Sleep apnea, and Type 2 diabetes mellitus, with  long-term current use of insulin (HCC).   has a past surgical history that includes Cesarean section (03/22/2001); AV fistula placement (08/2010); Ligation goretex fistula (01/04/11); Esophagogastroduodenoscopy (10/19/2011); Foot surgery (Left, 2014 approx.); left heart catheterization with coronary angiogram (N/A, 06/27/2012); AV fistula placement (Right, 05/07/2014); AV fistula placement (Right, 07/02/2014); Thrombectomy and revision of arterioventous (av) goretex  graft (Right, 07/16/2014); Cardiac catheterization (Left, 08/08/2014); Cardiac catheterization (Left, 08/08/2014); Ligation of arteriovenous  fistula (Left, 08/21/2014); Cardiac catheterization (Left, 03/08/2016); transthoracic echocardiogram (06/04/2016); Dobutamine stress echo (06/04/2016    [facility]); Cardiovascular stress test (06/25/2016   [facility]); Dilation and curettage of uterus (05/12/2000); Colonoscopy with esophagogastroduodenoscopy (egd) (10/19/2011); Laparoscopic cholecystectomy (1994); Femur IM nail (Left, child); Hysteroscopy with novasure (N/A, 08/19/2016); Colonoscopy; Polypectomy; Kidney transplant (09/18/2016); Ligation of arteriovenous  fistula (Left, 06/29/2017); and Nephrectomy transplanted organ.     Significant Hospital Events: Including procedures, antibiotic start and stop dates in addition to other pertinent events   07/09 Transferred to ICU  07/10: Intubated  BAL 7/13-improving O2 requirement 7/15 worsening renal function, fluid overloaded 7/15-tolerated hemodialysis  7/17-endotracheal tube was exchanged for mucous plugging Trach aspirate 7/18-emergent bronchoscopy for mucous plugging  - Mucous plugging noted this morning, required bagging off the vent and bronchoscopy Was able to respond to name calling, was able to squeeze hands bilateral upper extremities, did not wiggle toes BAL  Interim History / Subjective:   7/19: Febilr since 7/17 but wbc improved. On vent. On cleviprex due to severe BP along with opral meds ->  cleviprex needs increaed. Per phmarcuy oon lot of PO BP medications. Fio2 30%. Culture negative so far.  On PRN fentanyl. SEdation gtt off due to attempts to fully wake her up/  [employer]  - ET Tube - Left internal jugular - R internal jugular HD ath - R radial art line - Foley - OG tube -  NG tube   Objective   Blood pressure (!) 168/66, pulse 94, temperature 99.1 F (37.3 C), resp. rate (!) 35, height 5\' 6"  (1.676 m), weight 123.1 kg, SpO2 100%.      Vent Mode: PRVC FiO2 (%):  [30 %] 30 % Set Rate:  [24 bmp] 24 bmp Vt Set:  [510 mL] 510 mL PEEP:  [5 cmH20] 5 cmH20 Plateau Pressure:  [24 cmH20-28 cmH20] 28 cmH20   Intake/Output Summary (Last 24 hours) at 10/22/2022 9811 Last data filed at 10/22/2022 0800 Gross per 24 hour  Intake 946.66 ml  Output 2925 ml  Net -1978.34 ml   Filed Weights   10/20/22 0500 10/21/22 0317 10/22/22 0500  Weight: 129.7 kg 124.3 kg 123.1 kg    Examination: General Appearance:  Looks criticall ill OBESE - + Head:  Normocephalic, without obvious abnormality, atraumatic Eyes:  PERRL - yes, conjunctiva/corneas - muddy     Ears:  Normal external ear canals, both ears Nose:  G tube - yes  Throat:  ETT TUBE - yes , OG tube - yes Neck:  Supple,  No enlargement/tenderness/nodules Lungs: Clear to auscultation bilaterally, Ventilator   Synchrony - when sedated yes Heart:  S1 and S2 normal, no murmur, CVP - no.  Pressors - no Abdomen:  Soft, no masses, no organomegaly Genitalia / Rectal:  Not done Extremities:  Extremities- intact Skin:  ntact in exposed areas . Sacral area - not examined Neurologic:  Sedation - fent prn -> RASS - -3 to +1 and vent dysnchrony . Moves all 4s - no. CAM-ICU - no . Orientation - no     Labs reviewed  BAL culture-no organisms PJP PCR negative  Ventilator requirements stable -Rate of 24, tidal volume 470, PEEP of 5, FiO2 40%  Resolved Hospital Problem list   ARDS-result  Assessment & Plan:   Multilobar  pneumonia Negative testing for PJP pneumonia -On Bactrim PJP prophylaxis -Completed course of Zosyn   Receiving dialysis for anasarca: as of 7/18-Have been able to pull off about 3 L for the last 2 days of dialysis  Plan  - per renal    Acute hypoxemic respiratory failure ARDS-resolved Mucus plugging needing bronch 10/21/22  10/22/2022 - > D12 ventilator. Does NOT meet criteria for SBT/Extubation in setting of Acute Respiratory Failure due to encepphlopathy though improved, severe hypertension and ventilator dysynchrony  Pllan -Continue mechanical ventilation PRVC - SBT when feasible  Acute obutnded encephalopathy Sedation and agitation management  7/19 : as of 7/18 managed to wean off fentanyl gtt and Versed gtt  Plan -DC  enteral oxycodone and Klonopin   - fent IV Prn  Versed prn IV -start precedex gtt (QTC ok)     Gastroparesis -Did have an episode of vomiting overnight and with feeding tube to intermittent suction, hide over 700 cc suctioned -KUB with significant gaseous pattern suggesting ileus -Please order for cortrak postpyloric to see whether we can feed fer  History of renal transplant secondary to acute renal failure S/p cadaveric renal transplantation on immunosuppressants -Tacrolimus toxicity  Plan - per renal -Continue tacrolimus, continue CellCept - Continue daily prednisone 5mg  per day  Acute kidney injury on chronic kidney disease stage IIIa -Appreciate renal follow-up -Avoid nephrotoxic medications -On dialysis  Type 1 diabetes -Was on insulin infusion -Transition to subcu  Difficult to control hypertension -On labetalol, clonidine, hydralazine added -Remains difficult to wean off Cleviprex  Anemia of chronic illness   7/19: No active bleeds  Plan  - - PRBC  for hgb </= 6.9gm%    - exceptions are   -  if ACS susepcted/confirmed then transfuse for hgb </= 8.0gm%,  or    -  active bleeding with hemodynamic instability, then  transfuse regardless of hemoglobin value   At at all times try to transfuse 1 unit prbc as possible with exception of active hemorrhage   best Practice (right click and "Reselect all SmartList Selections" daily)   Diet/type: tubefeeds-on hold at present secondary to ileus DVT prophylaxis: prophylactic heparin  GI prophylaxis: PPI Lines: N/A Foley:  N/A Code Status:  full code Last date of multidisciplinary goals of care discussion [discussed with significant other at bedside 7/18,   Family updatd  7/19: female member sleeping at bedside. RN updating     ATTESTATION & SIGNATURE   The patient VALERIE CONES is critically ill with multiple organ systems failure and requires high complexity decision making for assessment and support, frequent evaluation and titration of therapies, application of advanced monitoring technologies and extensive interpretation of multiple databases and discussion with other appropriate health care personnel such as bedside nurses, social workers, case Production designer, theatre/television/film, consultants, respiratory therapists, nutritionists, secretaries etc.,  Critical care time includes but is not restricted to just documentation time. Documentation can happen in parallel or sequential to care time depending on case mix urgency and priorities for the shift. So, overall critical Care Time devoted to patient care services described in this note is  30  Minutes.   This time reflects time of care of this signee Dr Kalman Shan which includ does not reflect procedure time, or teaching time or supervisory time of PA/NP/Med student/Med Resident etc but could involve care discussion time     Dr. Kalman Shan, M.D., [facility].C.P Pulmonary and Critical Care Medicine Staff Physician, [facility] Critical [facility] Pager: [phone], If no answer or between  15:00h - 7:00h: call [phone]  10/22/2022 10:05 AM    LABS    PULMONARY Recent Labs  Lab  10/15/22 1501 10/16/22 0355 10/16/22 1558 10/17/22 0540  PHART 7.267* 7.312* 7.306* 7.323*  PCO2ART 50.1* 47.2 47.3 47.4  PO2ART 125* 110* 79* 70*  HCO3 22.9 23.7 23.6 24.7  TCO2 24 25 25 26   O2SAT 98 98 94 93    CBC Recent Labs  Lab 10/19/22 0338 10/20/22 0235 10/21/22 0313  HGB 9.0* 8.9* 8.9*  HCT 27.6* 26.8* 28.1*  WBC 23.1* 20.5* 17.3*  PLT 444* 422* 389    COAGULATION No results for input(s): "INR" in the last 168 hours.  CARDIAC  No results for input(s): "TROPONINI" in the last 168 hours. No results for input(s): "PROBNP" in the last 168 hours.   CHEMISTRY Recent Labs  Lab 10/17/22 0457 10/17/22 0540 10/18/22 0440 10/18/22 0956 10/19/22 0338 10/20/22 0235 10/21/22 0313  NA 126* 126* 125*  --  129* 131* 130*  K 5.4* 5.5* 5.8*   < > 5.2* 5.2* 4.1  CL 92*  --  91*  --  92* 92* 93*  CO2 23  --  21*  --  23 22 22   GLUCOSE 156*  --  142*  --  132* 134* 210*  BUN 88*  --  121*  --  111* 133* 103*  CREATININE 2.27*  --  3.15*  --  3.13* 3.86* 3.73*  CALCIUM 7.6*  --  7.7*  --  8.3* 8.7* 8.5*  MG 2.4  --   --   --   --   --   --  PHOS 6.1*  --   --   --  8.2* 9.7*  --    < > = values in this interval not displayed.   Estimated Creatinine Clearance: 25.2 mL/min (A) (by C-G formula based on SCr of 3.73 mg/dL (H)).   LIVER Recent Labs  Lab 10/19/22 0338 10/20/22 0235  ALBUMIN 2.6* 2.7*     INFECTIOUS No results for input(s): "LATICACIDVEN", "PROCALCITON" in the last 168 hours.   ENDOCRINE CBG (last 3)  Recent Labs    10/21/22 2319 10/22/22 0323 10/22/22 0753  GLUCAP 177* 177* 191*         IMAGING x48h  - image(s) personally visualized  -   highlighted in bold DG Abd Portable 1V  Result Date: 10/22/2022 CLINICAL DATA:  [id number] Encounter for feeding tube placement [id number] EXAM: PORTABLE ABDOMEN - 1 VIEW. Right flank and abdomen collimated off view. COMPARISON:  X-ray abdomen 10/21/2022 FINDINGS: Enteric tube with tip overlying the  expected region of the proximal jejunum. Second enteric tube with tip in the second portion of the duodenum. Gaseous distension of the small and large bowel. No radio-opaque calculi or other significant radiographic abnormality are seen. IMPRESSION: 1. Enteric tube with tip overlying the expected region of the proximal jejunum. 2. Second enteric tube with tip in the second portion of the duodenum. 3. Gaseous distension of small and large bowel suggestive of ileus. Electronically Signed   By: Tish Frederickson M.D.   On: 10/22/2022 09:32   DG Abd 1 View  Result Date: 10/21/2022 CLINICAL DATA:  Ileus EXAM: ABDOMEN - 1 VIEW COMPARISON:  Abdominal radiographs, most recently October 18, 2022 FINDINGS: NG tube terminates in the stomach. Diffuse gaseous distention of loops of small and large bowel throughout the abdomen, similar in extent compared to October 16, 2022. Partially visualized central venous catheter, terminating in the right atrium. No acute osseous abnormality. IMPRESSION: Diffuse gaseous distention of loops of small and large bowel, not significantly changed compared to priors and suggestive of ileus. Electronically Signed   By: Jacob Moores M.D.   On: 10/21/2022 11:10

## 2022-10-22 NOTE — Procedures (Signed)
Cortrak  Person Inserting Tube:  Pitts, Heather C, RD Tube Type:  Cortrak - 43 inches Tube Size:  10 Tube Location:  Left nare Secured by: Bridle Technique Used to Measure Tube Placement:  Marking at nare/corner of mouth Cortrak Secured At:  90 cm   Cortrak Tube Team Note:  Consult received to place a Cortrak feeding tube.   X-ray is required, abdominal x-ray has been ordered by the Cortrak team. Please confirm tube placement before using the Cortrak tube.   If the tube becomes dislodged please keep the tube and contact the Cortrak team at [email or web address] for replacement.  If after hours and replacement cannot be delayed, place a NG tube and confirm placement with an abdominal x-ray.   Heather P., RD, LDN, CNSC See AMiON for contact information    

## 2022-10-22 NOTE — Progress Notes (Addendum)
1635: Called to bedside rapidly as patient started to have more trouble with the vent and was becoming bradycardic. Patient was in the 60s when I went into the room. Patient was being bagged with some difficulty. Concern for mucus plug. Patient was administered some saline with suction and given 1 amp of bicarb. Ordered for Q1h suctioning, as well as order of hypertonic saline nebs. Will also obtain stat Chest Xray. After interventions patient did have better saturations and HR back up in to th low 100s. Will increase some precedex to help with agitation. RT and nursing was at bed side as well as HD Nurse.   STAFF MD Attestation  - I personally supervised these efforts     SIGNATURE    Dr. Kalman Shan, M.D., F.C.C.P,  Pulmonary and Critical Care Medicine Staff Physician, [facility] Center Director - Interstitial Lung Disease  Program  Pulmonary Fibrosis [facility] [place], [place], [address]   Pager: [phone], If no answer  -> Check AMION or Try [phone] Telephone (clinical office): [phone] Telephone (research): [phone]  9:34 AM 10/23/2022

## 2022-10-22 NOTE — Progress Notes (Signed)
   10/22/22 1829  Vitals  Temp 98.2 F (36.8 C)  Pulse Rate 78  Resp (!) 25  BP (!) 165/63  SpO2 99 %  O2 Device Ventilator  Weight  (not available)  Type of Weight Post-Dialysis  Oxygen Therapy  FiO2 (%) 30 %  Patient Activity (if Appropriate) In bed  Pulse Oximetry Type Continuous  Oximetry Probe Site Changed No  Post Treatment  Dialyzer Clearance Lightly streaked  Duration of HD Treatment -hour(s) 3.5 hour(s)  Hemodialysis Intake (mL) 0 mL  Liters Processed 64  Fluid Removed (mL) 4000 mL  Tolerated HD Treatment Yes   Received patient in bed to unit.  Alert and oriented.  Informed consent signed and in chart.   TX duration:3.5  Patient tolerated well.    [name] given to patient's nurse.   Access used: RDLC---Neck--temporary Access issues: no issues  Total UF removed: 4000 Medication(s) given: none   [facility]

## 2022-10-22 NOTE — Progress Notes (Signed)
Pt is set to get a Coretrak on 7/19, so in order for the OG to continue to decompress patient's stomach, all PO meds are on hold for tonight's shift per MD order. Managing sedation and pain needs with IV PRNs.

## 2022-10-22 NOTE — Progress Notes (Addendum)
Nutrition Follow-up  DOCUMENTATION CODES:  Obesity unspecified  INTERVENTION:  Continue OGT to suction Tube feeding via post-pyloric cortrak: Vital AF 1.2 goal rate of 60 ml/h (1440 ml per day) Initiate at 56mL/h and hold at trickle rate to monitor for tolerance. If no issues with vomiting, begin to advance by 10mL q8h to goal. Provides 1728 kcal, 108 gm protein, 1168 ml free water daily If pt unable to tolerate enteral feeds, would recommend consideration of TPN as pt has only received ~3 days of nutrition since being admitted to the ICU.   NUTRITION DIAGNOSIS:  Inadequate oral intake related to inability to eat as evidenced by NPO status. - Ongoing   GOAL:  Provide needs based on ASPEN/SCCM guidelines  - Ongoing   MONITOR:  TF tolerance, I & O's, Vent status, Labs  REASON FOR ASSESSMENT:  Rounds, Ventilator, Consult Enteral/tube feeding initiation and management  ASSESSMENT:  Pt with hx of prior ESRD on HD now with renal transplant 2018, DM type 2, HTN, HLD, CAD, gastroparesis, and GERD presented to ED with weakness and SOB. Imaging suggestive of pneumonia.  7/6 - admitted 7/9 - worsening SOB, transferred to ICU 7/10 - intubated, bronchoscopy; trickle TF started 7/12 - TF started advancing to goal 7/15 - TF held and OGT hooked to suction after TF noted in oral cavity, HD initiated 7/17 - pt with vomiting again overnight, OGT back to suction 7/18 - bronchoscopy, mucus plugging 7/19 - cortrak placed post-pyloric (proximal jejunum)  Pt remains intubated on vent support. Significant other at bedside. Pt had cortrak placed this AM and terminates in the proximal jejunum. OGT remains in place to suction with 1.3L out in the last 24 hours.   Will attempt to restart trickle feeds. If pt is still unable to tolerate despite post-pyloric placement would likely benefit from initiation of TPN as she has had minimal nutrition since being in ICU (3 days of TF prior to intolerance).  Last  HD session 7/17 4L removed, planned to undergo HD today.  MV: 16.7 L/min Temp (24hrs), Avg:99.8 F (37.7 C), Min:85.8 F (29.9 C), Max:100.9 F (38.3 C)   Intake/Output Summary (Last 24 hours) at 10/22/2022 0931 Last data filed at 10/22/2022 0800 Gross per 24 hour  Intake 946.66 ml  Output 2925 ml  Net -1978.34 ml  Net IO Since Admission: 14,215.54 mL [10/22/22 0931]  Nutritionally Relevant Medications: Scheduled Meds:  atorvastatin  10 mg Daily   famotidine  20 mg Daily   folic acid  1 mg Daily   insulin aspart  0-15 Units Q4H   insulin detemir  8 Units Daily   lactulose  20 g BID   metoCLOPramide  injection  10 mg Q6H   multivitamin with minerals  1 tablet Daily   mycophenolate  1,000 mg BID   polyethylene glycol  17 g BID   prazosin  1 mg QHS   predniSONE  5 mg Q breakfast   senna-docusate  1 tablet BID   sulfamethoxazole-trimethoprim  1 tablet Once per day on Monday Wednesday Friday   tacrolimus  1.5 mg BID   Continuous Infusions:  clevidipine 8 mg/hr (10/22/22 0800)   feeding supplement (VITAL AF 1.2 CAL) Stopped (10/21/22 0630)   fentaNYL infusion INTRAVENOUS Stopped (10/20/22 1601)   PRN Meds: alteplase  Labs Reviewed: Na 130, chloride 93 BUN 103, creatinine 3.73 Phosphorus 9.7 CBG ranges from 157-208 mg/dL over the last 24 hours HgbA1c 6.7% (5/24)  Diet Order:   Diet Order  Diet NPO time specified  Diet effective now                   EDUCATION NEEDS:  Not appropriate for education at this time  Skin:  Skin Assessment: Reviewed RN Assessment  Last BM:  7/18 FMS: 1,124mL out in the last 24 hours  Height:  Ht Readings from Last 1 Encounters:  10/21/22 5\' 6"  (1.676 m)   Weight:  Wt Readings from Last 1 Encounters:  10/22/22 123.1 kg   Ideal Body Weight:  59.1 kg  BMI:  Body mass index is 43.8 kg/m.  Estimated Nutritional Needs:  Kcal:  1800-2100kcal/d Protein:  100-120g/d Fluid:  1.8L/d   Greig Castilla, RD,  LDN Clinical Dietitian RD pager # available in AMION  After hours/weekend pager # available in [facility]

## 2022-10-22 NOTE — Progress Notes (Signed)
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  Subjective:  1L UOP yesterday, good news For HD Rpt Tac 1.7 this as a true 12h trough  07/18 0701 - 07/19 0700 In: 982.3 [I.V.:682.3; NG/GT:300] Out: 3920 [Urine:1475; Emesis/NG output:1300; Stool:1145]  Filed Weights   10/20/22 0500 10/21/22 0317 10/22/22 0500  Weight: 129.7 kg 124.3 kg 123.1 kg    Scheduled Meds:  amLODipine  10 mg Per Tube Daily   arformoterol  15 mcg Nebulization BID   ARIPiprazole  30 mg Per Tube Daily   atorvastatin  10 mg Per Tube Daily   buPROPion  225 mg Per Tube BID   clonazePAM  0.5 mg Per Tube BID   cloNIDine  0.3 mg Per Tube TID   famotidine  20 mg Per Tube Daily   folic acid  1 mg Per Tube Daily   guaiFENesin  400 mg Per Tube Q4H   heparin  5,000 Units Subcutaneous Q8H   hydrALAZINE  50 mg Per Tube Q8H   hydrOXYzine  25 mg Per Tube QHS   insulin aspart  0-15 Units Subcutaneous Q4H   insulin detemir  8 Units Subcutaneous Daily   lactulose  20 g Per Tube BID   loratadine  10 mg Per Tube Daily   metoCLOPramide (REGLAN) injection  10 mg Intravenous Q6H   metoprolol tartrate  5 mg Intravenous Q6H   montelukast  10 mg Per Tube QHS   multivitamin with minerals  1 tablet Per Tube Daily   mycophenolate  1,000 mg Per Tube BID   mouth rinse  15 mL Mouth Rinse Q2H   oxyCODONE  5 mg Per Tube Q6H   polyethylene glycol  17 g Per Tube BID   prazosin  1 mg Per Tube QHS   predniSONE  5 mg Per Tube Q breakfast   revefenacin  175 mcg Nebulization Daily   senna-docusate  1 tablet Per Tube BID   sulfamethoxazole-trimethoprim  1 tablet Per Tube Once per day on Monday Wednesday Friday   tacrolimus  1 mg Sublingual BID   Continuous Infusions:  sodium chloride 10 mL/hr at 10/22/22 0600   acetaminophen     anticoagulant sodium citrate     clevidipine 7 mg/hr (10/22/22 0624)   feeding supplement (VITAL AF 1.2 CAL) Stopped (10/21/22 0630)   fentaNYL infusion INTRAVENOUS Stopped  (10/20/22 1601)   PRN Meds:.sodium chloride, acetaminophen, albuterol, alteplase, anticoagulant sodium citrate, fentaNYL, guaiFENesin, heparin, hydrALAZINE, labetalol, midazolam, mouth rinse, oxyCODONE  Current Labs: reviewed today  Physical Exam:  Blood pressure (!) 168/66, pulse 89, temperature 99.3 F (37.4 C), resp. rate (!) 34, height 5\' 6"  (1.676 m), weight 123.1 kg, SpO2 96%. Intubated, sedated  A AKI on CKD3a in setting of ddKT 2018 (BL SCr 1.4-1.6) baseline IS Tac/MMF/pred Started HD 7/15 for refractory hypervolemia and hyperkalemia Using R internal jugular Temp HD cath placed 7/15 by CCM Hopefully this is a short term of RRT UOP maybe picking up 7/19, positive development Azotemia, as above; BUN remains > 100; labs consistent with very little GFR Hyperkalemia, persistent, trend with HD AHRF / Acute asthma exacerbation PCP excluded, off high doseTMP/SMX; ABX per CCM Inbutaed 7/10 IS, using Tac SL 1.5mg  BID (inc 7/19 with low 12h trhough), on cellcept now (go back to myfortic when tol PO); higher dose steriod from pulm indications; back on three times per week TMP/SMX AGMA, stable HTN: on  clevipres, meds uptitrated by CCM, trend with UF Severe hypervolemia /  anasarca as above  P HD today goal 4L UF.  2K bath.  Still hopeful for GFR recovery in time and supportive care, watch UOP Inc SL tac to 1.5 BID with low level; rpt 12 trough d/w pharmacy All questions answered for fiance Medication Issues; Preferred narcotic agents for pain control are hydromorphone, fentanyl, and methadone. Morphine should not be used.  Baclofen should be avoided Avoid oral sodium phosphate and magnesium citrate based laxatives / bowel preps   Patient status and daily plan discussed with primary RN and pharmacist  Sabra Heck MD 10/22/2022, 8:17 AM  Recent Labs  Lab 10/17/22 0457 10/17/22 0540 10/19/22 0338 10/20/22 0235 10/21/22 0313  NA 126*   < > 129* 131* 130*  K 5.4*   < > 5.2*  5.2* 4.1  CL 92*   < > 92* 92* 93*  CO2 23   < > 23 22 22   GLUCOSE 156*   < > 132* 134* 210*  BUN 88*   < > 111* 133* 103*  CREATININE 2.27*   < > 3.13* 3.86* 3.73*  CALCIUM 7.6*   < > 8.3* 8.7* 8.5*  PHOS 6.1*  --  8.2* 9.7*  --    < > = values in this interval not displayed.   Recent Labs  Lab 10/19/22 0338 10/20/22 0235 10/21/22 0313  WBC 23.1* 20.5* 17.3*  HGB 9.0* 8.9* 8.9*  HCT 27.6* 26.8* 28.1*  MCV 78.0* 77.9* 79.2*  PLT 444* 422* 389

## 2022-10-23 ENCOUNTER — Inpatient Hospital Stay (HOSPITAL_COMMUNITY): Payer: Medicare (Managed Care)

## 2022-10-23 DIAGNOSIS — J9601 Acute respiratory failure with hypoxia: Secondary | ICD-10-CM | POA: Diagnosis not present

## 2022-10-23 DIAGNOSIS — J189 Pneumonia, unspecified organism: Secondary | ICD-10-CM | POA: Diagnosis not present

## 2022-10-23 LAB — COMPREHENSIVE METABOLIC PANEL
ALT: 44 U/L (ref 0–44)
AST: 50 U/L — ABNORMAL HIGH (ref 15–41)
Albumin: 2.4 g/dL — ABNORMAL LOW (ref 3.5–5.0)
Alkaline Phosphatase: 95 U/L (ref 38–126)
Anion gap: 12 (ref 5–15)
BUN: 66 mg/dL — ABNORMAL HIGH (ref 6–20)
CO2: 25 mmol/L (ref 22–32)
Calcium: 8.4 mg/dL — ABNORMAL LOW (ref 8.9–10.3)
Chloride: 97 mmol/L — ABNORMAL LOW (ref 98–111)
Creatinine, Ser: 1.88 mg/dL — ABNORMAL HIGH (ref 0.44–1.00)
GFR, Estimated: 33 mL/min — ABNORMAL LOW (ref >60)
Glucose, Bld: 122 mg/dL — ABNORMAL HIGH (ref 70–99)
Potassium: 3.1 mmol/L — ABNORMAL LOW (ref 3.5–5.1)
Sodium: 134 mmol/L — ABNORMAL LOW (ref 135–145)
Total Bilirubin: 0.6 mg/dL (ref 0.3–1.2)
Total Protein: 6.4 g/dL — ABNORMAL LOW (ref 6.5–8.1)

## 2022-10-23 LAB — POCT I-STAT 7, (LYTES, BLD GAS, ICA,H+H)
Acid-Base Excess: 2 mmol/L (ref 0.0–2.0)
Bicarbonate: 25.5 mmol/L (ref 20.0–28.0)
Calcium, Ion: 1.11 mmol/L — ABNORMAL LOW (ref 1.15–1.40)
HCT: 26 % — ABNORMAL LOW (ref 36.0–46.0)
Hemoglobin: 8.8 g/dL — ABNORMAL LOW (ref 12.0–15.0)
O2 Saturation: 94 %
Patient temperature: 98.8
Potassium: 3.5 mmol/L (ref 3.5–5.1)
Sodium: 133 mmol/L — ABNORMAL LOW (ref 135–145)
TCO2: 27 mmol/L (ref 22–32)
pCO2 arterial: 34.5 mmHg (ref 32–48)
pH, Arterial: 7.477 — ABNORMAL HIGH (ref 7.35–7.45)
pO2, Arterial: 67 mmHg — ABNORMAL LOW (ref 83–108)

## 2022-10-23 LAB — GLUCOSE, CAPILLARY
Glucose-Capillary: 121 mg/dL — ABNORMAL HIGH (ref 70–99)
Glucose-Capillary: 117 mg/dL — ABNORMAL HIGH (ref 70–99)
Glucose-Capillary: 151 mg/dL — ABNORMAL HIGH (ref 70–99)
Glucose-Capillary: 141 mg/dL — ABNORMAL HIGH (ref 70–99)
Glucose-Capillary: 133 mg/dL — ABNORMAL HIGH (ref 70–99)
Glucose-Capillary: 143 mg/dL — ABNORMAL HIGH (ref 70–99)

## 2022-10-23 LAB — BASIC METABOLIC PANEL
Anion gap: 20 — ABNORMAL HIGH (ref 5–15)
BUN: 67 mg/dL — ABNORMAL HIGH (ref 6–20)
CO2: 22 mmol/L (ref 22–32)
Calcium: 8.6 mg/dL — ABNORMAL LOW (ref 8.9–10.3)
Chloride: 92 mmol/L — ABNORMAL LOW (ref 98–111)
Creatinine, Ser: 1.76 mg/dL — ABNORMAL HIGH (ref 0.44–1.00)
GFR, Estimated: 36 mL/min — ABNORMAL LOW (ref >60)
Glucose, Bld: 150 mg/dL — ABNORMAL HIGH (ref 70–99)
Potassium: 3.6 mmol/L (ref 3.5–5.1)
Sodium: 134 mmol/L — ABNORMAL LOW (ref 135–145)

## 2022-10-23 LAB — MAGNESIUM: Magnesium: 2.4 mg/dL (ref 1.7–2.4)

## 2022-10-23 MED ORDER — ACETAMINOPHEN 325 MG PO TABS
650.0000 mg | ORAL_TABLET | Freq: Four times a day (QID) | ORAL | Status: DC | PRN
  Administered 2022-10-23 (×2): 650 mg

## 2022-10-23 MED ORDER — POTASSIUM CHLORIDE 20 MEQ PO PACK
40.0000 meq | PACK | Freq: Once | ORAL | Status: AC
  Administered 2022-10-23: 40 meq
  Filled 2022-10-23: qty 2

## 2022-10-23 MED ORDER — ACETAMINOPHEN 325 MG PO TABS
ORAL_TABLET | ORAL | Status: AC
  Filled 2022-10-23: qty 2

## 2022-10-23 MED ORDER — FLUCONAZOLE IN SODIUM CHLORIDE 200-0.9 MG/100ML-% IV SOLN
200.0000 mg | INTRAVENOUS | Status: DC
  Filled 2022-10-23: qty 100

## 2022-10-23 MED ORDER — SODIUM CHLORIDE 3 % IN NEBU
4.0000 mL | INHALATION_SOLUTION | RESPIRATORY_TRACT | Status: DC
  Administered 2022-10-23 – 2022-10-25 (×12): 4 mL via RESPIRATORY_TRACT
  Filled 2022-10-23 (×12): qty 4

## 2022-10-23 MED ORDER — FUROSEMIDE 10 MG/ML IJ SOLN
120.0000 mg | Freq: Two times a day (BID) | INTRAVENOUS | Status: DC
  Administered 2022-10-23 – 2022-10-26 (×7): 120 mg via INTRAVENOUS
  Filled 2022-10-23 (×5): qty 10
  Filled 2022-10-23: qty 12
  Filled 2022-10-23 (×2): qty 10

## 2022-10-23 MED ORDER — TACROLIMUS 1 MG PO CAPS
1.0000 mg | ORAL_CAPSULE | Freq: Two times a day (BID) | ORAL | Status: DC
  Filled 2022-10-23: qty 1

## 2022-10-23 MED ORDER — SCOPOLAMINE 1 MG/3DAYS TD PT72
1.0000 | MEDICATED_PATCH | TRANSDERMAL | Status: DC
  Administered 2022-10-23 – 2022-10-26 (×2): 1.5 mg via TRANSDERMAL
  Filled 2022-10-23 (×2): qty 1

## 2022-10-23 MED ORDER — TACROLIMUS 1 MG PO CAPS
1.5000 mg | ORAL_CAPSULE | Freq: Two times a day (BID) | ORAL | Status: DC
  Administered 2022-10-23 – 2022-10-27 (×8): 1.5 mg via SUBLINGUAL
  Filled 2022-10-23 (×9): qty 1

## 2022-10-23 NOTE — Progress Notes (Addendum)
eLink Physician-Brief Progress Note Patient Name: CHRISTEN WARDROP DOB: 1976/03/18 MRN: [id number]   Date of Service  10/23/2022  HPI/Events of Note  K+ 3.1, Cr 1.88.  eICU Interventions  KCL 40 meq PO x 1 ordered.        Thomasene Lot Shronda Boeh 10/23/2022, 5:25 AM

## 2022-10-23 NOTE — Progress Notes (Addendum)
NAME:  Linda Ellison, MRN:  [id number], DOB:  1975-07-06, LOS: 13 ADMISSION DATE:  10/09/2022, CONSULTATION DATE: 10/12/2022 REFERRING MD: Triad, CHIEF COMPLAINT: Respiratory distress  BRIEF  47 year old female with extensive past medical history is well-documented below who was admitted 2 days prior to this consult with multifocal pneumonia.  She has had increasing work of breathing with a respiratory rate of 46.  She had a CT scan that demonstrates bilateral multifocal pneumonia, elevated BNP, and increased work of breathing that will require her being transferred to the intensive care unit.  She has noted a nonproductive cough prior to admission.  Increasing shortness of breath prior to admission.  And increasing lower extremity edema.  She is status post cadaveric renal transplant and she is followed by nephrology.  Currently she is hypokalemic with creatinine in the 2.2 range.  Due to her increasing work of breathing impending respiratory failure she will be transferred to intensive care unit for further evaluation and treatment.  She has already been started on aggressive diuresis and steroids and empirical antimicrobial therapy.  Pertinent  Medical History    has a past medical history of Allergy, Anemia associated with chronic renal failure, Anxiety, Asthma, Atypical chest pain, AV (arteriovenous fistula) (HCC), CAD (coronary artery disease) (cardiologist-- dr Sharol Roussel [facility] [place] [facility] in [place])), Depression, Elevated lipids, ESRD on hemodialysis (HCC) (NEPHROLOGIST-  DR MATTINGLY), Gastroparesis, GERD (gastroesophageal reflux disease), History of pneumonia, Hyperlipidemia, Hyperthyroidism, Menorrhagia, Other secondary hypertension, Peripheral neuropathy, PONV (postoperative nausea and vomiting), Pre-transplant evaluation for kidney transplant, Renal failure syndrome (03/05/2022), Secondary hyperparathyroidism of renal origin [facility]), Sleep apnea, and Type 2 diabetes mellitus, with  long-term current use of insulin (HCC).   has a past surgical history that includes Cesarean section (03/22/2001); AV fistula placement (08/2010); Ligation goretex fistula (01/04/11); Esophagogastroduodenoscopy (10/19/2011); Foot surgery (Left, 2014 approx.); left heart catheterization with coronary angiogram (N/A, 06/27/2012); AV fistula placement (Right, 05/07/2014); AV fistula placement (Right, 07/02/2014); Thrombectomy and revision of arterioventous (av) goretex  graft (Right, 07/16/2014); Cardiac catheterization (Left, 08/08/2014); Cardiac catheterization (Left, 08/08/2014); Ligation of arteriovenous  fistula (Left, 08/21/2014); Cardiac catheterization (Left, 03/08/2016); transthoracic echocardiogram (06/04/2016); Dobutamine stress echo (06/04/2016    [facility]); Cardiovascular stress test (06/25/2016   [facility]); Dilation and curettage of uterus (05/12/2000); Colonoscopy with esophagogastroduodenoscopy (egd) (10/19/2011); Laparoscopic cholecystectomy (1994); Femur IM nail (Left, child); Hysteroscopy with novasure (N/A, 08/19/2016); Colonoscopy; Polypectomy; Kidney transplant (09/18/2016); Ligation of arteriovenous  fistula (Left, 06/29/2017); and Nephrectomy transplanted organ.     Significant Hospital Events: Including procedures, antibiotic start and stop dates in addition to other pertinent events   10/09/2022 - ADMIT Respiratory culture no growth MRSA PCR negative Blood culture negative Respiratory virus panel: Negative 07/09 Transferred to ICU  07/10: Intubated  BAL: neg as of 7/20 7/13-improving O2 requirement 7/15 worsening renal function, fluid overloaded 7/15-tolerated hemodialysis  7/17-endotracheal tube was exchanged for mucous plugging Trach aspirate-20,000 colonies of Candida albicans [immunosuppressed patient] 7/18-emergent bronchoscopy for mucous plugging  - Mucous plugging noted this morning, required bagging off the vent and bronchoscopy Was able to respond to name calling, was able to squeeze  hands bilateral upper extremities, did not wiggle toes BAL -  7/19: Febilr since 7/17 but wbc improved. On vent. On cleviprex due to severe BP along with opral meds -> cleviprex needs increaed. Per phmarcuy oon lot of PO BP medications. Fio2 30%. Culture negative so far.  On PRN fentanyl. SEdation gtt off due to attempts to fully wake her up/ 3.5 hours of dialysis. Mucous  plugging noted again at around 4:30 PM despite Mucomyst.  -> ordere  increased to fET tube requent suctioning  Interim History / Subjective:    10/23/2022: Respiratory reported spontaneous breathing trial attempt but patient continues to have mucous plugs and agitation with WUA ( though followed commands).  Currently on 50% FiO2.  On Cleviprex and Precedex.  Fever continues.  White count increased to 19.7 K. Diarrhea ++ - RN has stiopped lacutlose. No HD today per renal. Giving lasix gtt per renal  [employer]  - ET Tube 10/13/22 - Left internal jugular - R internal jugular HD ath - R radial art line - Foley 10/10/22 approx - OG tube - NG tube - Flexiseal 10/19/22   Objective   Blood pressure (!) 170/64, pulse 84, temperature (!) 100.5 F (38.1 C), temperature source Axillary, resp. rate (!) 31, height 5\' 6"  (1.676 m), weight 117.7 kg, SpO2 100%.      Vent Mode: PRVC FiO2 (%):  [30 %-50 %] 50 % Set Rate:  [24 bmp] 24 bmp Vt Set:  [510 mL] 510 mL PEEP:  [5 cmH20] 5 cmH20 Plateau Pressure:  [16 cmH20-28 cmH20] 16 cmH20   Intake/Output Summary (Last 24 hours) at 10/23/2022 6578 Last data filed at 10/23/2022 4696 Gross per 24 hour  Intake 1986.1 ml  Output 5845 ml  Net -3858.9 ml   Filed Weights   10/22/22 0500 10/22/22 1430 10/23/22 0500  Weight: 123.1 kg 123.1 kg 117.7 kg    Examination: General Appearance:  Looks criticall ill OBESE - + Head:  Normocephalic, without obvious abnormality, atraumatic Eyes:  PERRL - yes, conjunctiva/corneas - muddy     Ears:  Normal external ear canals, both ears Nose:  G tube - yes   Throat:  ETT TUBE - yes , OG tube - yes Neck:  Supple,  No enlargement/tenderness/nodules Lungs: Clear to auscultation bilaterally, Ventilator   Synchrony - when sedated yes Heart:  S1 and S2 normal, no murmur, CVP - no.  Pressors - no Abdomen:  Soft, no masses, no organomegaly Genitalia / Rectal:  Not done Extremities:  Extremities- intact Skin:  ntact in exposed areas . Sacral area - not examined Neurologic:  Sedation - fent prn -> RASS - -3 to +1 and vent dysnchrony . Moves all 4s - no. CAM-ICU - no . Orientation - no     Resolved Hospital Problem list   ARDS-result  Assessment & Plan:   Acute hypoxemic respiratory failure ARDS-resolved Mucus plugging needing bronch 10/21/22 and 7/19/2  10/23/2022 - > D13 ventilator.  Has done SBT but Does NOT meet criteria for Extubation in setting of Acute Respiratory Failure due to encepphlopathy though improved, severe hypertension and ventilator dysynchrony and mucuc plugg  Pllan -Continue mechanical ventilation PRVC - SBT as toleratd; no extubation -Continue Mucomyst since 10/21/2022 - STart 3% saline nebs q4h on 10/23/2022  -Continue every hour ET tube suctioning since 10/22/2022 Start scopolamine patch- 10/23/2022 -Await improvements from Diflucan and Lasix  Immunosuppressed status due to CellCept and chronic prednisone and tacrolimus; on Bactrim prophylaxis =- Prior to & Present on Admit Multilobar pneumonia idiopathic   Present on admit (empiric Rx)Completd course for Zosyn  -7/20: Having rising white count and new onset fever.  Repeated mucous plugs.  BAL most recently positive for Candida  Plan - Given immunosuppressed status start Diflucan [request QTc monitoring] -> ADDENDU 3:04 PM d/w ID and likely colonizer so will cancel (d/w Pharmacy) - await other cultures   Acute obutnded encephalopathy ? Onsent  date and fent and versed gtt stpped by 10/21/22   7/19 24 beginning to follow commands but having severe ventilator synchrony  necessitating stopping fentanyl.  Stop oral opioids and benzos 10/23/22: Intermittently follows commands during wake up  Plan -DC  enteral oxycodone and Klonopin since 10/22/2022  - fent IV Prn  Versed prn IV -Continue  precedex gtt (QTC ok) since 10/22/2022   -Continue Abilify - Continue Wellbutrin  Severe Hypertension  7/20: On Cleviprex infusion since 10/17/2022.  On clonidine oral, scheduled along with prazosin scheduled.  Hydralazine as needed, labetalol as needed and Lopressor as needed  Plan  - cotninue BP contril  History of renal transplant secondary to acute renal failure S/p cadaveric renal transplantation on immunosuppressants -Tacrolimus toxicity Acute kidney injury on chronic kidney disease stage IIIa:  Receiving dialysis for anasarca: With volume removal   10/23/22: Most recent dialysis was 10/22/2022.  Renal starting Lasix infusion.  Making urine  Plan - per renal -Continue tacrolimus, continue CellCept - Continue daily prednisone 5mg  per day - -On Bactrim PJP prophylaxis -Discontinue Foley catheter and place pure wick -Monitor urine output through Lasix infusion.   Type 1 diabetes  PLAN  - SSI   Gastroparesis due to DM Ileus onset 10/18/22 and perssent 10/22/22  10/23/22: No vomiting. Not on TF sinc e 7/18 and restarrted 10/23/22. Also, having diarrhea and Flexi-Seal placed 10/19/2022 and noticed to be on antidiarrheals   Plan  - monitor   - need to decide on trickle feds - since 10/19/2022 Continue Reglan -DC lactulose - DC MiraLAX - DC senna docusate - IF No improvement, stool GI Panel PCR  Anemia of chronic illness   7/19 and 7/20: No active bleeds  Plan  - - PRBC for hgb </= 6.9gm%    - exceptions are   -  if ACS susepcted/confirmed then transfuse for hgb </= 8.0gm%,  or    -  active bleeding with hemodynamic instability, then transfuse regardless of hemoglobin value   At at all times try to transfuse 1 unit prbc as possible with exception of  active hemorrhage  At risjk for QT prolongation with Precedex and also Diflucan and also Reglan  Plan - Monitor EKG QTc interval   best Practice (right click and "Reselect all SmartList Selections" daily)   Diet/type: tubefeeds- restarted and maintain trickle 10/23/22 DVT prophylaxis: prophylactic heparin  GI prophylaxis: PPI Lines: N/A Foley:  N/A Code Status:  full code Last date of multidisciplinary goals of care discussion [discussed with significant other at bedside 7/18,   Family updatd  7/19: female member sleeping at bedside. RN updating 7/20 Molly Maduro:  [phone]:      ATTESTATION & SIGNATURE   The patient Linda Ellison is critically ill with multiple organ systems failure and requires high complexity decision making for assessment and support, frequent evaluation and titration of therapies, application of advanced monitoring technologies and extensive interpretation of multiple databases and discussion with other appropriate health care personnel such as bedside nurses, social workers, case Production designer, theatre/television/film, consultants, respiratory therapists, nutritionists, secretaries etc.,  Critical care time includes but is not restricted to just documentation time. Documentation can happen in parallel or sequential to care time depending on case mix urgency and priorities for the shift. So, overall critical Care Time devoted to patient care services described in this note is  45  Minutes.   This time reflects time of care of this signee Dr Kalman Shan which includ does not reflect procedure time,  or teaching time or supervisory time of PA/NP/Med student/Med Resident etc but could involve care discussion time     Dr. Kalman Shan, M.D., [facility].C.P Pulmonary and Critical Care Medicine Staff Physician, [facility] [place] Pulmonary and Critical Care Pager: [phone], If no answer or between  15:00h - 7:00h: call [phone]  10/23/2022 10:43 AM     LABS     PULMONARY Recent Labs  Lab 10/16/22 1558 10/17/22 0540 10/23/22 0602  PHART 7.306* 7.323* 7.477*  PCO2ART 47.3 47.4 34.5  PO2ART 79* 70* 67*  HCO3 23.6 24.7 25.5  TCO2 25 26 27   O2SAT 94 93 94    CBC Recent Labs  Lab 10/20/22 0235 10/21/22 0313 10/22/22 1500 10/23/22 0602  HGB 8.9* 8.9* 8.4* 8.8*  HCT 26.8* 28.1* 26.8* 26.0*  WBC 20.5* 17.3* 19.7*  --   PLT 422* 389 321  --     COAGULATION No results for input(s): "INR" in the last 168 hours.  CARDIAC  No results for input(s): "TROPONINI" in the last 168 hours. No results for input(s): "PROBNP" in the last 168 hours.   CHEMISTRY Recent Labs  Lab 10/17/22 0457 10/17/22 0540 10/19/22 0338 10/20/22 0235 10/21/22 0313 10/22/22 1500 10/23/22 0354 10/23/22 0602  NA 126*   < > 129* 131* 130* 134* 134* 133*  K 5.4*   < > 5.2* 5.2* 4.1 3.2* 3.1* 3.5  CL 92*   < > 92* 92* 93* 95* 97*  --   CO2 23   < > 23 22 22 23 25   --   GLUCOSE 156*   < > 132* 134* 210* 184* 122*  --   BUN 88*   < > 111* 133* 103* 117* 66*  --   CREATININE 2.27*   < > 3.13* 3.86* 3.73* 2.70* 1.88*  --   CALCIUM 7.6*   < > 8.3* 8.7* 8.5* 8.4* 8.4*  --   MG 2.4  --   --   --   --   --  2.4  --   PHOS 6.1*  --  8.2* 9.7*  --  7.5*  --   --    < > = values in this interval not displayed.   Estimated Creatinine Clearance: 48.8 mL/min (A) (by C-G formula based on SCr of 1.88 mg/dL (H)).   LIVER Recent Labs  Lab 10/19/22 0338 10/20/22 0235 10/22/22 1500 10/23/22 0354  AST  --   --   --  50*  ALT  --   --   --  44  ALKPHOS  --   --   --  95  BILITOT  --   --   --  0.6  PROT  --   --   --  6.4*  ALBUMIN 2.6* 2.7* 2.4* 2.4*     INFECTIOUS No results for input(s): "LATICACIDVEN", "PROCALCITON" in the last 168 hours.   ENDOCRINE CBG (last 3)  Recent Labs    10/22/22 2326 10/23/22 0317 10/23/22 0734  GLUCAP 102* 121* 117*         IMAGING x48h  - image(s) personally visualized  -   highlighted in bold DG CHEST PORT 1  VIEW  Result Date: 10/22/2022 CLINICAL DATA:  Shortness of breath EXAM: PORTABLE CHEST 1 VIEW COMPARISON:  Previous studies including the examination of 10/18/2022 FINDINGS: Transverse diameter of heart is increased. Central pulmonary vessels are prominent. There are patchy alveolar densities in both perihilar regions and both lower lung  fields suggesting pulmonary edema or multifocal pneumonia. There is some improvement in aeration in right mid and right lower lung fields. Lateral CP angles are clear. There is no pneumothorax. Tip of the endotracheal tube is 5.6 cm above the carina. Enteric tube is noted traversing the esophagus. Tip of left IJ central venous catheter is seen in right atrium. Tip of right IJ central venous catheter is seen in superior vena cava. IMPRESSION: There is prominence of central pulmonary vessels with alveolar densities in both lungs suggesting pulmonary edema or multifocal pneumonia. There is interval improvement in aeration in right parahilar region and right lower lung field suggesting decreasing atelectasis. Support devices as described above. Electronically Signed   By: Ernie Avena M.D.   On: 10/22/2022 17:18   DG Abd Portable 1V  Result Date: 10/22/2022 CLINICAL DATA:  [id number] Encounter for feeding tube placement [id number] EXAM: PORTABLE ABDOMEN - 1 VIEW. Right flank and abdomen collimated off view. COMPARISON:  X-ray abdomen 10/21/2022 FINDINGS: Enteric tube with tip overlying the expected region of the proximal jejunum. Second enteric tube with tip in the second portion of the duodenum. Gaseous distension of the small and large bowel. No radio-opaque calculi or other significant radiographic abnormality are seen. IMPRESSION: 1. Enteric tube with tip overlying the expected region of the proximal jejunum. 2. Second enteric tube with tip in the second portion of the duodenum. 3. Gaseous distension of small and large bowel suggestive of ileus. Electronically Signed   By:  Tish Frederickson M.D.   On: 10/22/2022 09:32   DG Abd 1 View  Result Date: 10/21/2022 CLINICAL DATA:  Ileus EXAM: ABDOMEN - 1 VIEW COMPARISON:  Abdominal radiographs, most recently October 18, 2022 FINDINGS: NG tube terminates in the stomach. Diffuse gaseous distention of loops of small and large bowel throughout the abdomen, similar in extent compared to October 16, 2022. Partially visualized central venous catheter, terminating in the right atrium. No acute osseous abnormality. IMPRESSION: Diffuse gaseous distention of loops of small and large bowel, not significantly changed compared to priors and suggestive of ileus. Electronically Signed   By: Jacob Moores M.D.   On: 10/21/2022 11:10

## 2022-10-23 NOTE — Progress Notes (Signed)
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  Subjective:  HD yesterday 4L UF tolerated well 1.3L UOP yesterday  07/19 0701 - 07/20 0700 In: 1546.4 [I.V.:1306.4; NG/GT:240] Out: 5360 [Urine:1260; Stool:100]  Filed Weights   10/22/22 0500 10/22/22 1430 10/23/22 0500  Weight: 123.1 kg 123.1 kg 117.7 kg    Scheduled Meds:  amLODipine  10 mg Per Tube Daily   arformoterol  15 mcg Nebulization BID   ARIPiprazole  30 mg Per Tube Daily   atorvastatin  10 mg Per Tube Daily   buPROPion  225 mg Per Tube BID   cloNIDine  0.3 mg Per Tube TID   famotidine  20 mg Per Tube Daily   folic acid  1 mg Per Tube Daily   guaiFENesin  400 mg Per Tube Q4H   heparin  5,000 Units Subcutaneous Q8H   hydrALAZINE  100 mg Per Tube Q8H   hydrOXYzine  25 mg Per Tube QHS   insulin aspart  0-15 Units Subcutaneous Q4H   insulin detemir  8 Units Subcutaneous Daily   lactulose  20 g Per Tube BID   metoCLOPramide (REGLAN) injection  10 mg Intravenous Q6H   metoprolol tartrate  5 mg Intravenous Q6H   montelukast  10 mg Per Tube QHS   multivitamin with minerals  1 tablet Per Tube Daily   mycophenolate  1,000 mg Per Tube BID   mouth rinse  15 mL Mouth Rinse Q2H   polyethylene glycol  17 g Per Tube Daily   prazosin  1 mg Per Tube QHS   predniSONE  5 mg Per Tube Q breakfast   revefenacin  175 mcg Nebulization Daily   scopolamine  1 patch Transdermal Q72H   senna-docusate  1 tablet Per Tube QHS   sodium chloride HYPERTONIC  4 mL Nebulization Q4H   sulfamethoxazole-trimethoprim  1 tablet Per Tube Once per day on Monday Wednesday Friday   tacrolimus  1.5 mg Sublingual BID   Continuous Infusions:  sodium chloride Stopped (10/23/22 0733)   anticoagulant sodium citrate     clevidipine 8 mg/hr (10/23/22 0928)   dexmedetomidine (PRECEDEX) IV infusion 1.2 mcg/kg/hr (10/23/22 0928)   feeding supplement (VITAL AF 1.2 CAL) Stopped (10/21/22 0630)   PRN Meds:.sodium chloride,  acetaminophen, albuterol, alteplase, anticoagulant sodium citrate, fentaNYL (SUBLIMAZE) injection, guaiFENesin, heparin, heparin, hydrALAZINE, labetalol, midazolam, mouth rinse, oxyCODONE  Current Labs: reviewed today  Physical Exam:  Blood pressure (!) 170/64, pulse 84, temperature (!) 100.5 F (38.1 C), temperature source Axillary, resp. rate (!) 31, height 5\' 6"  (1.676 m), weight 117.7 kg, SpO2 100%. Intubated, sedated  A AKI on CKD3a in setting of ddKT 2018 (BL SCr 1.4-1.6) baseline IS Tac/MMF/pred Started HD 7/15 for refractory hypervolemia and hyperkalemia, last Tx 7/19 Using R internal jugular Temp HD cath placed 7/15 by CCM Hopefully this is a short term of RRT UOP maybe picking up 7/19, positive development;  Watch UOP and labs over next 48h for GFR recovery Azotemia, as above; improved with HD Hyperkalemia,resolved with HD AHRF / Acute asthma exacerbation PCP excluded, off high doseTMP/SMX; ABX per CCM Inbutaed 7/10, prolonged  IS, using Tac SL 1.5mg  BID (inc 7/19 with low 12h trhough), on cellcept now (go back to myfortic when tol PO); higher dose steriod from pulm indications; back on three times per week TMP/SMX AGMA, resolved HTN: on  clevipres, meds uptitrated by CCM, trend with UF Severe hypervolemia / anasarca as above  P Trial of BID  lasix today 120 IV  Still hopeful for GFR recovery in time and supportive care, watch UOP All questions answered for mother Medication Issues; Preferred narcotic agents for pain control are hydromorphone, fentanyl, and methadone. Morphine should not be used.  Baclofen should be avoided Avoid oral sodium phosphate and magnesium citrate based laxatives / bowel preps   Patient status and daily plan discussed with Dr. Merrily Pew CCM Sabra Heck MD 10/23/2022, 10:09 AM  Recent Labs  Lab 10/19/22 1610 10/20/22 0235 10/21/22 0313 10/22/22 1500 10/23/22 0354 10/23/22 0602  NA 129* 131* 130* 134* 134* 133*  K 5.2* 5.2* 4.1 3.2* 3.1* 3.5   CL 92* 92* 93* 95* 97*  --   CO2 23 22 22 23 25   --   GLUCOSE 132* 134* 210* 184* 122*  --   BUN 111* 133* 103* 117* 66*  --   CREATININE 3.13* 3.86* 3.73* 2.70* 1.88*  --   CALCIUM 8.3* 8.7* 8.5* 8.4* 8.4*  --   PHOS 8.2* 9.7*  --  7.5*  --   --    Recent Labs  Lab 10/20/22 0235 10/21/22 0313 10/22/22 1500 10/23/22 0602  WBC 20.5* 17.3* 19.7*  --   NEUTROABS  --   --  17.6*  --   HGB 8.9* 8.9* 8.4* 8.8*  HCT 26.8* 28.1* 26.8* 26.0*  MCV 77.9* 79.2* 81.7  --   PLT 422* 389 321  --

## 2022-10-24 ENCOUNTER — Inpatient Hospital Stay (HOSPITAL_COMMUNITY): Payer: Medicare (Managed Care)

## 2022-10-24 DIAGNOSIS — J189 Pneumonia, unspecified organism: Secondary | ICD-10-CM | POA: Diagnosis not present

## 2022-10-24 DIAGNOSIS — J80 Acute respiratory distress syndrome: Secondary | ICD-10-CM | POA: Diagnosis not present

## 2022-10-24 LAB — COMPREHENSIVE METABOLIC PANEL
ALT: 48 U/L — ABNORMAL HIGH (ref 0–44)
AST: 43 U/L — ABNORMAL HIGH (ref 15–41)
Albumin: 2.3 g/dL — ABNORMAL LOW (ref 3.5–5.0)
Alkaline Phosphatase: 132 U/L — ABNORMAL HIGH (ref 38–126)
Anion gap: 19 — ABNORMAL HIGH (ref 5–15)
BUN: 72 mg/dL — ABNORMAL HIGH (ref 6–20)
CO2: 21 mmol/L — ABNORMAL LOW (ref 22–32)
Calcium: 8.4 mg/dL — ABNORMAL LOW (ref 8.9–10.3)
Chloride: 94 mmol/L — ABNORMAL LOW (ref 98–111)
Creatinine, Ser: 1.62 mg/dL — ABNORMAL HIGH (ref 0.44–1.00)
GFR, Estimated: 39 mL/min — ABNORMAL LOW (ref >60)
Glucose, Bld: 210 mg/dL — ABNORMAL HIGH (ref 70–99)
Potassium: 3.6 mmol/L (ref 3.5–5.1)
Sodium: 134 mmol/L — ABNORMAL LOW (ref 135–145)
Total Bilirubin: 1 mg/dL (ref 0.3–1.2)
Total Protein: 6.3 g/dL — ABNORMAL LOW (ref 6.5–8.1)

## 2022-10-24 LAB — BRAIN NATRIURETIC PEPTIDE: B Natriuretic Peptide: 737 pg/mL — ABNORMAL HIGH (ref 0.0–100.0)

## 2022-10-24 LAB — MAGNESIUM: Magnesium: 2.3 mg/dL (ref 1.7–2.4)

## 2022-10-24 LAB — GLUCOSE, CAPILLARY
Glucose-Capillary: 202 mg/dL — ABNORMAL HIGH (ref 70–99)
Glucose-Capillary: 176 mg/dL — ABNORMAL HIGH (ref 70–99)
Glucose-Capillary: 195 mg/dL — ABNORMAL HIGH (ref 70–99)
Glucose-Capillary: 182 mg/dL — ABNORMAL HIGH (ref 70–99)
Glucose-Capillary: 149 mg/dL — ABNORMAL HIGH (ref 70–99)
Glucose-Capillary: 156 mg/dL — ABNORMAL HIGH (ref 70–99)

## 2022-10-24 LAB — CBC
HCT: 27.1 % — ABNORMAL LOW (ref 36.0–46.0)
Hemoglobin: 8.4 g/dL — ABNORMAL LOW (ref 12.0–15.0)
MCH: 26.1 pg (ref 26.0–34.0)
MCHC: 31 g/dL (ref 30.0–36.0)
MCV: 84.2 fL (ref 80.0–100.0)
Platelets: 275 10*3/uL (ref 150–400)
RBC: 3.22 MIL/uL — ABNORMAL LOW (ref 3.87–5.11)
RDW: 16.5 % — ABNORMAL HIGH (ref 11.5–15.5)
WBC: 12.2 10*3/uL — ABNORMAL HIGH (ref 4.0–10.5)
nRBC: 0.2 % (ref 0.0–0.2)

## 2022-10-24 LAB — CULTURE, RESPIRATORY W GRAM STAIN

## 2022-10-24 LAB — LACTIC ACID, PLASMA: Lactic Acid, Venous: 0.9 mmol/L (ref 0.5–1.9)

## 2022-10-24 LAB — PHOSPHORUS: Phosphorus: 4.6 mg/dL (ref 2.5–4.6)

## 2022-10-24 MED ORDER — ACETAMINOPHEN 160 MG/5ML PO SOLN
650.0000 mg | Freq: Four times a day (QID) | ORAL | Status: DC | PRN
  Administered 2022-10-24: 650 mg
  Filled 2022-10-24: qty 20.3

## 2022-10-24 MED ORDER — POTASSIUM CHLORIDE 20 MEQ PO PACK
40.0000 meq | PACK | Freq: Every day | ORAL | Status: DC
  Administered 2022-10-24 – 2022-10-26 (×3): 40 meq
  Filled 2022-10-24 (×4): qty 2

## 2022-10-24 MED ORDER — INSULIN ASPART 100 UNIT/ML IJ SOLN
0.0000 [IU] | INTRAMUSCULAR | Status: DC
  Administered 2022-10-24 (×3): 4 [IU] via SUBCUTANEOUS
  Administered 2022-10-24: 3 [IU] via SUBCUTANEOUS
  Administered 2022-10-25 (×2): 4 [IU] via SUBCUTANEOUS
  Administered 2022-10-25: 3 [IU] via SUBCUTANEOUS
  Administered 2022-10-25: 7 [IU] via SUBCUTANEOUS
  Administered 2022-10-25: 3 [IU] via SUBCUTANEOUS
  Administered 2022-10-26 – 2022-10-27 (×8): 4 [IU] via SUBCUTANEOUS
  Administered 2022-10-27: 3 [IU] via SUBCUTANEOUS
  Administered 2022-10-27: 11 [IU] via SUBCUTANEOUS
  Administered 2022-10-27: 4 [IU] via SUBCUTANEOUS
  Administered 2022-10-27: 7 [IU] via SUBCUTANEOUS
  Administered 2022-10-28 (×3): 11 [IU] via SUBCUTANEOUS
  Administered 2022-10-28: 15 [IU] via SUBCUTANEOUS
  Administered 2022-10-28: 11 [IU] via SUBCUTANEOUS
  Administered 2022-10-28: 7 [IU] via SUBCUTANEOUS
  Administered 2022-10-29: 11 [IU] via SUBCUTANEOUS
  Administered 2022-10-29: 7 [IU] via SUBCUTANEOUS
  Administered 2022-10-29: 11 [IU] via SUBCUTANEOUS
  Administered 2022-10-29: 4 [IU] via SUBCUTANEOUS
  Administered 2022-10-29: 11 [IU] via SUBCUTANEOUS
  Administered 2022-10-29: 7 [IU] via SUBCUTANEOUS
  Administered 2022-10-30: 4 [IU] via SUBCUTANEOUS
  Administered 2022-10-30 (×2): 3 [IU] via SUBCUTANEOUS

## 2022-10-24 MED ORDER — CARVEDILOL 12.5 MG PO TABS
12.5000 mg | ORAL_TABLET | Freq: Two times a day (BID) | ORAL | Status: DC
  Administered 2022-10-24 – 2022-10-27 (×7): 12.5 mg
  Filled 2022-10-24 (×7): qty 1

## 2022-10-24 MED ORDER — VITAL AF 1.2 CAL PO LIQD
1000.0000 mL | ORAL | Status: DC
  Administered 2022-10-24: 1000 mL

## 2022-10-24 MED ORDER — ACETAMINOPHEN 325 MG PO TABS: 650.0000 mg | ORAL_TABLET | Freq: Four times a day (QID) | ORAL | Status: DC | PRN

## 2022-10-24 NOTE — Progress Notes (Signed)
NAME:  Linda Ellison, MRN:  [id number], DOB:  1975-09-01, LOS: 14 ADMISSION DATE:  10/09/2022, CONSULTATION DATE: 10/12/2022 REFERRING MD: [facility]ad, CHIEF COMPLAINT: Respiratory distress  BRIEF  47 year old female with extensive past medical history is well-documented below who was admitted 2 days prior to this consult with multifocal pneumonia.  She has had increasing work of breathing with a respiratory rate of 46.  She had a CT scan that demonstrates bilateral multifocal pneumonia, elevated BNP, and increased work of breathing that will require her being transferred to the intensive care unit.  She has noted a nonproductive cough prior to admission.  Increasing shortness of breath prior to admission.  And increasing lower extremity edema.  She is status post cadaveric renal transplant and she is followed by nephrology.  Currently she is hypokalemic with creatinine in the 2.2 range.  Due to her increasing work of breathing impending respiratory failure she will be transferred to intensive care unit for further evaluation and treatment.  She has already been started on aggressive diuresis and steroids and empirical antimicrobial therapy.  Pertinent  Medical History    has a past medical history of Allergy, Anemia associated with chronic renal failure, Anxiety, Asthma, Atypical chest pain, AV (arteriovenous fistula) (HCC), CAD (coronary artery disease) (cardiologist-- dr Sharol Roussel [facility] [place] [facility] in [place])), Depression, Elevated lipids, ESRD on hemodialysis (HCC) (NEPHROLOGIST-  DR MATTINGLY), Gastroparesis, GERD (gastroesophageal reflux disease), History of pneumonia, Hyperlipidemia, Hyperthyroidism, Menorrhagia, Other secondary hypertension, Peripheral neuropathy, PONV (postoperative nausea and vomiting), Pre-transplant evaluation for kidney transplant, Renal failure syndrome (03/05/2022), Secondary hyperparathyroidism of renal origin [facility]), Sleep apnea, and Type 2 diabetes mellitus, with  long-term current use of insulin (HCC).   has a past surgical history that includes Cesarean section (03/22/2001); AV fistula placement (08/2010); Ligation goretex fistula (01/04/11); Esophagogastroduodenoscopy (10/19/2011); Foot surgery (Left, 2014 approx.); left heart catheterization with coronary angiogram (N/A, 06/27/2012); AV fistula placement (Right, 05/07/2014); AV fistula placement (Right, 07/02/2014); Thrombectomy and revision of arterioventous (av) goretex  graft (Right, 07/16/2014); Cardiac catheterization (Left, 08/08/2014); Cardiac catheterization (Left, 08/08/2014); Ligation of arteriovenous  fistula (Left, 08/21/2014); Cardiac catheterization (Left, 03/08/2016); transthoracic echocardiogram (06/04/2016); Dobutamine stress echo (06/04/2016    [facility]); Cardiovascular stress test (06/25/2016   [facility]); Dilation and curettage of uterus (05/12/2000); Colonoscopy with esophagogastroduodenoscopy (egd) (10/19/2011); Laparoscopic cholecystectomy (1994); Femur IM nail (Left, child); Hysteroscopy with novasure (N/A, 08/19/2016); Colonoscopy; Polypectomy; Kidney transplant (09/18/2016); Ligation of arteriovenous  fistula (Left, 06/29/2017); and Nephrectomy transplanted organ.     Significant Hospital Events: Including procedures, antibiotic start and stop dates in addition to other pertinent events   10/09/2022 - ADMIT Respiratory culture no growth MRSA PCR negative Blood culture negative Respiratory virus panel: Negative 07/09 Transferred to ICU  07/10: Intubated  BAL: neg as of 7/20 7/13-improving O2 requirement 7/15 worsening renal function, fluid overloaded 7/15-tolerated hemodialysis  7/17-endotracheal tube was exchanged for mucous plugging Trach aspirate-20,000 colonies of Candida albicans [immunosuppressed patient] - 7/18-emergent bronchoscopy for mucous plugging  - Mucous plugging noted this morning, required bagging off the vent and bronchoscopy Was able to respond to name calling, was able to  squeeze hands bilateral upper extremities, did not wiggle toes BAL -rare yeast 7/19: Febilr since 7/17 but wbc improved. On vent. On cleviprex due to severe BP along with opral meds -> cleviprex needs increaed. Per phmarcuy oon lot of PO BP medications. Fio2 30%. Culture negative so far.  On PRN fentanyl. SEdation gtt off due to attempts to fully wake her up/ 3.5 hours of dialysis.  Mucous plugging noted again at around 4:30 PM despite Mucomyst.  -> ordere  increased to fET tube requent suctioning  10/23/2022: Respiratory reported spontaneous breathing trial attempt but patient continues to have mucous plugs and agitation with WUA ( though followed commands).  Currently on 50% FiO2.  On Cleviprex and Precedex.  Fever continues.  White count increased to 19.7 K. Diarrhea ++ - RN has stiopped lacutlose. No HD today per renal. Giving lasix gtt per renal Started 3% saline neb and scopolamine patch.  Off Mucomyst.  Interim History / Subjective:   10/24/22: Remains on both Cleviprex and Precedex.  On the ventilator FiO2 40% getting Lasix per renal.  Also on tube feeds.  Low-grade fever persists but improved.  White count improved to 12,200. Making urine 1.4L UOP with lasixs. Creat better.  Still having copious respiratory secretions and only controlled by 3% saline neb and also by frequent ET tube suctioning.   - ? Has new rash on chest  [employer]  - ET Tube 10/13/22 - Left internal jugular - R internal jugular HD ath - R radial art line - Foley 10/10/22 approx - OG tube - NG tube - Flexiseal 10/19/22   Objective   Blood pressure (!) 143/66, pulse 88, temperature 99.6 F (37.6 C), temperature source Oral, resp. rate (!) 24, height 5\' 6"  (1.676 m), weight 118.4 kg, SpO2 98%.      Vent Mode: PRVC FiO2 (%):  [40 %-50 %] 40 % Set Rate:  [24 bmp] 24 bmp Vt Set:  [510 mL] 510 mL PEEP:  [5 cmH20] 5 cmH20 Plateau Pressure:  [15 cmH20-26 cmH20] 24 cmH20   Intake/Output Summary (Last 24 hours) at  10/24/2022 1126 Last data filed at 10/24/2022 1610 Gross per 24 hour  Intake 2746.96 ml  Output 1510 ml  Net 1236.96 ml   Filed Weights   10/22/22 1430 10/23/22 0500 10/24/22 0400  Weight: 123.1 kg 117.7 kg 118.4 kg    Examination: General Appearance:  Looks criticall ill OBESE - + Head:  Normocephalic, without obvious abnormality, atraumatic Eyes:  PERRL - yes, conjunctiva/corneas - muddy     Ears:  Normal external ear canals, both ears Nose:  G tube - yes  Throat:  ETT TUBE - yes , OG tube - yes Neck:  Supple,  No enlargement/tenderness/nodules Lungs: Clear to auscultation bilaterally, Ventilator   Synchrony - when sedated yes Heart:  S1 and S2 normal, no murmur, CVP - no.  Pressors - no Abdomen:  Soft, no masses, no organomegaly Genitalia / Rectal:  Not done Extremities:  Extremities- intact Skin:  ntact in exposed areas . Sacral area - not examined. HAS PUNCTATE RED 1mm lesion scattered on anteiior chest (mother in law say  it is not baseline) Neurologic:  Sedation - fent prn -> RASS - 1to +1 and looks better. Moves all 4s - no. CAM-ICU - no . Orientation - no     Resolved Hospital Problem list   ARDS-result  Assessment & Plan:   Acute hypoxemic respiratory failure ARDS-resolved Mucus plugging needing bronch 10/21/22 and 10/22/22 (s/p mucomst x 2 days ending 7/20)  10/24/2022 - > D11 ventilator.  Has done SBT but Does NOT meet criteria for Extubation in setting of Acute Respiratory Failure due to encepphlopathy though improved, severe hypertension and ventilator dysynchrony and mucuc plugoing and volume overload. Mucus pluggin improved with frequent suctioning and 3% saline neb  Pllan -Continue mechanical ventilation PRVC; cnsider tracheostomy depending on course - SBT as toleratd; no extubation -  3% saline nebs q4h since  10/23/2022  -Continue every hour ET tube suctioning since 10/22/2022 Continue  scopolamine patch- 10/23/2022 -Await improvements from  Lasix  Immunosuppressed status due to CellCept and chronic prednisone and tacrolimus; on Bactrim prophylaxis =- Prior to & Present on Admit Multilobar pneumonia idiopathic   Present on admit (empiric Rx)Completd course for Zosyn  -7/20: Having rising white count and new onset fever.  Repeated mucous plugs.  BAL most recently positive for Candida likely candiat  Plan - Given immunosuppressed status start Diflucan [request QTc monitoring] ->  - await other cultures   - consider antifungal if repeatedl yeast or high colony count   Acute obutnded encephalopathy ? Onsent date and fent and versed gtt stpped by 10/21/22   10/24/22: much improved . Opens eyes and nods  Plan -Off enteral oxycodone and Klonopin since 10/22/2022  - fent IV Prn  Versed prn IV -Continue  precedex gtt (QTC ok) since 10/22/2022   -Continue Abilify - Continue Wellbutrin  Severe Hypertension  7/21: On Cleviprex infusion since 10/17/2022.  On clonidine oral, scheduled along with prazosin scheduled.  Hydralazine as needed, labetalol as needed and Lopressor as needed/ . Per renal still hypervolemic and BP wil be tough to control  Plan  - cotninue BP contril - change lopressor to coreg - cotninue cloninidine oral - continue parazosin oral - cotninue high dose lasix   History of renal transplant secondary to acute renal failure S/p cadaveric renal transplantation on immunosuppressants -Tacrolimus toxicity Acute kidney injury on chronic kidney disease stage IIIa:  Receiving dialysis for anasarca: With volume removal   10/13/22: Most recent dialysis was 10/22/2022.  On high dose lasix since 10/23/22 Plan - per renal -Continue tacrolimus, continue CellCept - Continue daily prednisone 5mg  per day - -On Bactrim PJP prophylaxis -Discontinue Foley catheter and place pure wick -Monitor urine output through Lasix hig dose   Type 1 diabetes  PLAN  - SSI   Gastroparesis due to DM Ileus onset 10/18/22 and perssent  10/22/22 Diarrhea - s/p flexiseal on 10/19/22 - anti-diarrheals held 10/23/22  10/24/22: No vomiting x 48h. TF restarrted 10/23/22 and tolerating. Diarrhea still ongoind despite dc laxatives   Plan  - monitor   -increaes TF to 30cc/h and maintain - Reglan since 10/19/22 -DC lactulose since 10/2022 - DC MiraLAX since 10/23/22 - DC senna docusate somce 10/23/22 - Check , stool GI Panel PCR  Anemia of chronic illness   10/24/22:  No active bleeds  Plan  - - PRBC for hgb </= 6.9gm%    - exceptions are   -  if ACS susepcted/confirmed then transfuse for hgb </= 8.0gm%,  or    -  active bleeding with hemodynamic instability, then transfuse regardless of hemoglobin value   At at all times try to transfuse 1 unit prbc as possible with exception of active hemorrhage  At risjk for QT prolongation with Precedex and Reglan  10/23/22: QTc 470smec  Plan - Monitor EKG QTc interval as needed  SKIN  ?new rash chest 10/24/22  Plan   - mnitor - consider biopsy depending on course   best Practice (right click and "Reselect all SmartList Selections" daily)   Diet/type: tubefeeds- restarted  7/20/240 increaes to 30cc/h on 7/21 DVT prophylaxis: prophylactic heparin  GI prophylaxis: PPI Lines: N/A Foley:  N/A Code Status:  full code Last date of multidisciplinary goals of care discussion [discussed with significant other at bedside 7/18,   Family updatd  7/19: female member sleeping at  bedside. RN updating 7/20 Molly Maduro:  [phone]:  10/24/22: Frankey Poot updated     ATTESTATION & SIGNATURE   The patient Linda Ellison is critically ill with multiple organ systems failure and requires high complexity decision making for assessment and support, frequent evaluation and titration of therapies, application of advanced monitoring technologies and extensive interpretation of multiple databases and discussion with other appropriate health care personnel such as bedside nurses, social  workers, case Production designer, theatre/television/film, consultants, respiratory therapists, nutritionists, secretaries etc.,  Critical care time includes but is not restricted to just documentation time. Documentation can happen in parallel or sequential to care time depending on case mix urgency and priorities for the shift. So, overall critical Care Time devoted to patient care services described in this note is  35  Minutes.   This time reflects time of care of this signee Dr Kalman Shan which includ does not reflect procedure time, or teaching time or supervisory time of PA/NP/Med student/Med Resident etc but could involve care discussion time     Dr. Kalman Shan, M.D., [facility].C.P Pulmonary and Critical Care Medicine Staff Physician, [facility] Pager: [phone], If no answer or between  15:00h - 7:00h: call [phone]  10/24/2022 11:51 AM      LABS    PULMONARY Recent Labs  Lab 10/23/22 0602  PHART 7.477*  PCO2ART 34.5  PO2ART 67*  HCO3 25.5  TCO2 27  O2SAT 94    CBC Recent Labs  Lab 10/21/22 0313 10/22/22 1500 10/23/22 0602 10/24/22 0403  HGB 8.9* 8.4* 8.8* 8.4*  HCT 28.1* 26.8* 26.0* 27.1*  WBC 17.3* 19.7*  --  12.2*  PLT 389 321  --  275    COAGULATION No results for input(s): "INR" in the last 168 hours.  CARDIAC  No results for input(s): "TROPONINI" in the last 168 hours. No results for input(s): "PROBNP" in the last 168 hours.   CHEMISTRY Recent Labs  Lab 10/19/22 0338 10/20/22 0235 10/21/22 0313 10/22/22 1500 10/23/22 0354 10/23/22 0602 10/23/22 1029 10/24/22 0403  NA 129* 131* 130* 134* 134* 133* 134* 134*  K 5.2* 5.2* 4.1 3.2* 3.1* 3.5 3.6 3.6  CL 92* 92* 93* 95* 97*  --  92* 94*  CO2 23 22 22 23 25   --  22 21*  GLUCOSE 132* 134* 210* 184* 122*  --  150* 210*  BUN 111* 133* 103* 117* 66*  --  67* 72*  CREATININE 3.13* 3.86* 3.73* 2.70* 1.88*  --  1.76* 1.62*  CALCIUM 8.3* 8.7* 8.5* 8.4* 8.4*  --  8.6* 8.4*   MG  --   --   --   --  2.4  --   --  2.3  PHOS 8.2* 9.7*  --  7.5*  --   --   --  4.6   Estimated Creatinine Clearance: 56.8 mL/min (A) (by C-G formula based on SCr of 1.62 mg/dL (H)).   LIVER Recent Labs  Lab 10/19/22 0338 10/20/22 0235 10/22/22 1500 10/23/22 0354 10/24/22 0403  AST  --   --   --  50* 43*  ALT  --   --   --  44 48*  ALKPHOS  --   --   --  95 132*  BILITOT  --   --   --  0.6 1.0  PROT  --   --   --  6.4* 6.3*  ALBUMIN 2.6* 2.7* 2.4*  2.4* 2.3*     INFECTIOUS Recent Labs  Lab 10/24/22 0403  LATICACIDVEN 0.9     ENDOCRINE CBG (last 3)  Recent Labs    10/23/22 2319 10/24/22 0321 10/24/22 0734  GLUCAP 143* 202* 176*         IMAGING x48h  - image(s) personally visualized  -   highlighted in bold DG CHEST PORT 1 VIEW  Result Date: 10/24/2022 CLINICAL DATA:  Chest pain. EXAM: PORTABLE CHEST 1 VIEW COMPARISON:  10/23/2022 FINDINGS: ET tube tip is 3.6 cm above the carina. Left IJ catheter is noted with tip in the right atrium. Right IJ catheter tip is in the distal SVC. There is a feeding tube with tip coursing below the GE junction. Stable cardiomediastinal contours. Persistent bilateral pulmonary opacities which are compatible with edema and or infection. IMPRESSION: 1. Persistent bilateral pulmonary opacities compatible with edema and or infection. 2. Lines and tubes as described above. Electronically Signed   By: Signa Kell M.D.   On: 10/24/2022 09:00   DG CHEST PORT 1 VIEW  Result Date: 10/23/2022 CLINICAL DATA:  Endotracheal tube present. EXAM: PORTABLE CHEST 1 VIEW COMPARISON:  Chest radiograph 10/22/2022 FINDINGS: An endotracheal tube remains in place and terminates approximately 5 cm above the carina. Bilateral jugular venous catheters are unchanged. A feeding tube terminates in the abdomen near the ligament of Treitz. The cardiac silhouette remains enlarged. Lung volumes remain low with similar appearance of pulmonary vascular congestion and  bilateral interstitial and patchy airspace opacities which are greatest in the lower lungs. No large pleural effusion or pneumothorax is identified. IMPRESSION: 1. Support devices as above. 2. Unchanged bilateral lung opacities which may reflect edema or pneumonia. Electronically Signed   By: Sebastian Ache M.D.   On: 10/23/2022 13:06   DG CHEST PORT 1 VIEW  Result Date: 10/22/2022 CLINICAL DATA:  Shortness of breath EXAM: PORTABLE CHEST 1 VIEW COMPARISON:  Previous studies including the examination of 10/18/2022 FINDINGS: Transverse diameter of heart is increased. Central pulmonary vessels are prominent. There are patchy alveolar densities in both perihilar regions and both lower lung fields suggesting pulmonary edema or multifocal pneumonia. There is some improvement in aeration in right mid and right lower lung fields. Lateral CP angles are clear. There is no pneumothorax. Tip of the endotracheal tube is 5.6 cm above the carina. Enteric tube is noted traversing the esophagus. Tip of left IJ central venous catheter is seen in right atrium. Tip of right IJ central venous catheter is seen in superior vena cava. IMPRESSION: There is prominence of central pulmonary vessels with alveolar densities in both lungs suggesting pulmonary edema or multifocal pneumonia. There is interval improvement in aeration in right parahilar region and right lower lung field suggesting decreasing atelectasis. Support devices as described above. Electronically Signed   By: Ernie Avena M.D.   On: 10/22/2022 17:18

## 2022-10-24 NOTE — Progress Notes (Signed)
Pt initially suspected of having c.diff, however no stool noted this shift and patient does not meet criteria for c.diff testing at this time. Discussed with Dr. Marchelle Gearing and GI panel/enteric precautions discontinued.

## 2022-10-24 NOTE — Progress Notes (Signed)
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 GFR recovery  07/20 0701 - 07/21 0700 In: 3068.5 [I.V.:1734.4; NG/GT:1170; IV Piggyback:124] Out: 1995 [Urine:1355; Stool:640]  Filed Weights   10/22/22 1430 10/23/22 0500 10/24/22 0400  Weight: 123.1 kg 117.7 kg 118.4 kg    Scheduled Meds:  amLODipine  10 mg Per Tube Daily   arformoterol  15 mcg Nebulization BID   ARIPiprazole  30 mg Per Tube Daily   atorvastatin  10 mg Per Tube Daily   buPROPion  225 mg Per Tube BID   cloNIDine  0.3 mg Per Tube TID   famotidine  20 mg Per Tube Daily   folic acid  1 mg Per Tube Daily   guaiFENesin  400 mg Per Tube Q4H   heparin  5,000 Units Subcutaneous Q8H   hydrALAZINE  100 mg Per Tube Q8H   hydrOXYzine  25 mg Per Tube QHS   insulin aspart  0-15 Units Subcutaneous Q4H   insulin detemir  8 Units Subcutaneous Daily   metoCLOPramide (REGLAN) injection  10 mg Intravenous Q6H   metoprolol tartrate  5 mg Intravenous Q6H   montelukast  10 mg Per Tube QHS   multivitamin with minerals  1 tablet Per Tube Daily   mycophenolate  1,000 mg Per Tube BID   mouth rinse  15 mL Mouth Rinse Q2H   prazosin  1 mg Per Tube QHS   predniSONE  5 mg Per Tube Q breakfast   revefenacin  175 mcg Nebulization Daily   scopolamine  1 patch Transdermal Q72H   sodium chloride HYPERTONIC  4 mL Nebulization Q4H   sulfamethoxazole-trimethoprim  1 tablet Per Tube Once per day on Monday Wednesday Friday   tacrolimus  1.5 mg Sublingual BID   Continuous Infusions:  sodium chloride 10 mL/hr at 10/24/22 0800   anticoagulant sodium citrate     clevidipine 15 mg/hr (10/24/22 0800)   dexmedetomidine (PRECEDEX) IV infusion 1.2 mcg/kg/hr (10/24/22 0921)   feeding supplement (VITAL AF 1.2 CAL) 20 mL/hr at 10/24/22 0800   furosemide 120 mg (10/24/22 0810)   PRN Meds:.sodium chloride, acetaminophen,  albuterol, alteplase, anticoagulant sodium citrate, fentaNYL (SUBLIMAZE) injection, guaiFENesin, heparin, heparin, hydrALAZINE, labetalol, midazolam, mouth rinse, oxyCODONE  Current Labs: reviewed today  Physical Exam:  Blood pressure (!) 143/66, pulse 88, temperature 99.6 F (37.6 C), temperature source Oral, resp. rate (!) 24, height 5\' 6"  (1.676 m), weight 118.4 kg, SpO2 98%. Intubated, sedated Regular Coarse bs b/l Diffuse edema  HD cath c/d/I S/nt  A AKI on CKD3a in setting of ddKT 2018 (BL SCr 1.4-1.6) baseline IS Tac/MMF/pred Started HD 7/15 for refractory hypervolemia and hyperkalemia, last Tx 7/19 Using R internal jugular Temp HD cath placed 7/15 by CCM Hopefully this is a short term of RRT -- maybe recovering GFR.  No RRT needed. Follow for another 24-48h, cont lasix Azotemia, as above; improved with HD Hyperkalemia,resolved with HD AHRF / Acute asthma exacerbation PCP excluded, off high doseTMP/SMX; ABX per CCM Inbutaed 7/10, prolonged  IS, using Tac SL 1.5mg  BID (inc 7/19 with low 12h trhough), on cellcept now (go back to myfortic when tol PO); higher dose steriod from pulm indications; back on three times per week TMP/SMX AGMA, cont HD HTN: on  clevipres, meds uptitrated by CCM, trend with UF Severe hypervolemia / anasarca as above  P Ad above,  cont BID lasix today 120 IV  All questions answered for mother Medication Issues; Preferred narcotic agents for pain control are hydromorphone, fentanyl, and methadone. Morphine should not be used.  Baclofen should be avoided Avoid oral sodium phosphate and magnesium citrate based laxatives / bowel preps    Sabra Heck MD 10/24/2022, 9:36 AM  Recent Labs  Lab 10/20/22 0235 10/21/22 0313 10/22/22 1500 10/23/22 0354 10/23/22 0602 10/23/22 1029 10/24/22 0403  NA 131*   < > 134* 134* 133* 134* 134*  K 5.2*   < > 3.2* 3.1* 3.5 3.6 3.6  CL 92*   < > 95* 97*  --  92* 94*  CO2 22   < > 23 25  --  22 21*  GLUCOSE 134*    < > 184* 122*  --  150* 210*  BUN 133*   < > 117* 66*  --  67* 72*  CREATININE 3.86*   < > 2.70* 1.88*  --  1.76* 1.62*  CALCIUM 8.7*   < > 8.4* 8.4*  --  8.6* 8.4*  PHOS 9.7*  --  7.5*  --   --   --  4.6   < > = values in this interval not displayed.   Recent Labs  Lab 10/21/22 0313 10/22/22 1500 10/23/22 0602 10/24/22 0403  WBC 17.3* 19.7*  --  12.2*  NEUTROABS  --  17.6*  --   --   HGB 8.9* 8.4* 8.8* 8.4*  HCT 28.1* 26.8* 26.0* 27.1*  MCV 79.2* 81.7  --  84.2  PLT 389 321  --  275

## 2022-10-25 ENCOUNTER — Inpatient Hospital Stay (HOSPITAL_COMMUNITY): Payer: Medicare (Managed Care)

## 2022-10-25 DIAGNOSIS — J189 Pneumonia, unspecified organism: Secondary | ICD-10-CM | POA: Diagnosis not present

## 2022-10-25 DIAGNOSIS — J9601 Acute respiratory failure with hypoxia: Secondary | ICD-10-CM | POA: Diagnosis not present

## 2022-10-25 DIAGNOSIS — N179 Acute kidney failure, unspecified: Secondary | ICD-10-CM | POA: Diagnosis not present

## 2022-10-25 LAB — COMPREHENSIVE METABOLIC PANEL
ALT: 51 U/L — ABNORMAL HIGH (ref 0–44)
AST: 41 U/L (ref 15–41)
Albumin: 2.1 g/dL — ABNORMAL LOW (ref 3.5–5.0)
Alkaline Phosphatase: 157 U/L — ABNORMAL HIGH (ref 38–126)
Anion gap: 11 (ref 5–15)
BUN: 67 mg/dL — ABNORMAL HIGH (ref 6–20)
CO2: 23 mmol/L (ref 22–32)
Calcium: 8.2 mg/dL — ABNORMAL LOW (ref 8.9–10.3)
Chloride: 101 mmol/L (ref 98–111)
Creatinine, Ser: 1.23 mg/dL — ABNORMAL HIGH (ref 0.44–1.00)
GFR, Estimated: 55 mL/min — ABNORMAL LOW (ref >60)
Glucose, Bld: 183 mg/dL — ABNORMAL HIGH (ref 70–99)
Potassium: 3 mmol/L — ABNORMAL LOW (ref 3.5–5.1)
Sodium: 135 mmol/L (ref 135–145)
Total Bilirubin: 0.8 mg/dL (ref 0.3–1.2)
Total Protein: 6 g/dL — ABNORMAL LOW (ref 6.5–8.1)

## 2022-10-25 LAB — CBC
HCT: 27 % — ABNORMAL LOW (ref 36.0–46.0)
Hemoglobin: 8.2 g/dL — ABNORMAL LOW (ref 12.0–15.0)
MCH: 25.1 pg — ABNORMAL LOW (ref 26.0–34.0)
MCHC: 30.4 g/dL (ref 30.0–36.0)
MCV: 82.6 fL (ref 80.0–100.0)
Platelets: 288 10*3/uL (ref 150–400)
RBC: 3.27 MIL/uL — ABNORMAL LOW (ref 3.87–5.11)
RDW: 16.4 % — ABNORMAL HIGH (ref 11.5–15.5)
WBC: 9.6 10*3/uL (ref 4.0–10.5)
nRBC: 0 % (ref 0.0–0.2)

## 2022-10-25 LAB — PHOSPHORUS: Phosphorus: 4 mg/dL (ref 2.5–4.6)

## 2022-10-25 LAB — GLUCOSE, CAPILLARY
Glucose-Capillary: 150 mg/dL — ABNORMAL HIGH (ref 70–99)
Glucose-Capillary: 168 mg/dL — ABNORMAL HIGH (ref 70–99)
Glucose-Capillary: 205 mg/dL — ABNORMAL HIGH (ref 70–99)
Glucose-Capillary: 186 mg/dL — ABNORMAL HIGH (ref 70–99)
Glucose-Capillary: 139 mg/dL — ABNORMAL HIGH (ref 70–99)
Glucose-Capillary: 178 mg/dL — ABNORMAL HIGH (ref 70–99)

## 2022-10-25 LAB — MAGNESIUM: Magnesium: 2.1 mg/dL (ref 1.7–2.4)

## 2022-10-25 MED ORDER — METOCLOPRAMIDE HCL 5 MG/ML IJ SOLN
5.0000 mg | Freq: Three times a day (TID) | INTRAMUSCULAR | Status: DC
  Administered 2022-10-25 – 2022-10-26 (×4): 5 mg via INTRAVENOUS
  Filled 2022-10-25 (×4): qty 2

## 2022-10-25 MED ORDER — SPIRONOLACTONE 25 MG PO TABS
25.0000 mg | ORAL_TABLET | Freq: Every day | ORAL | Status: DC
  Administered 2022-10-25 – 2022-10-26 (×2): 25 mg
  Filled 2022-10-25 (×2): qty 1

## 2022-10-25 MED ORDER — VITAL AF 1.2 CAL PO LIQD
1000.0000 mL | ORAL | Status: DC
  Administered 2022-10-25 – 2022-10-26 (×2): 1000 mL

## 2022-10-25 MED ORDER — POTASSIUM CHLORIDE 10 MEQ/50ML IV SOLN
10.0000 meq | INTRAVENOUS | Status: AC
  Administered 2022-10-25 (×4): 10 meq via INTRAVENOUS
  Filled 2022-10-25 (×4): qty 50

## 2022-10-25 MED ORDER — OXYCODONE HCL 5 MG PO TABS
5.0000 mg | ORAL_TABLET | Freq: Four times a day (QID) | ORAL | Status: DC | PRN
  Filled 2022-10-25 (×2): qty 1

## 2022-10-25 MED ORDER — POTASSIUM CHLORIDE 20 MEQ PO PACK
60.0000 meq | PACK | Freq: Once | ORAL | Status: AC
  Administered 2022-10-25: 60 meq
  Filled 2022-10-25: qty 3

## 2022-10-25 NOTE — Progress Notes (Signed)
This RN responded to low arterial line pressure alarm in pts room.  Found arterial line to be very positional and no longer able to draw back blood return.  Waveform on the monitor was severely dampened.  Consulted with RT and the decision was made to pull the line as it was no longer functioning.  Peripheral cuff pressure obtained and result consistent with previous measurements.  Art line in tact when removed.  Pressure held and hemostasis achieved.  Gauze dressing applied with tegaderm.  Will continue to monitor.

## 2022-10-25 NOTE — Progress Notes (Signed)
NAME:  Linda Ellison, MRN:  [id number], DOB:  1975-09-02, LOS: 15 ADMISSION DATE:  10/09/2022, CONSULTATION DATE: 10/12/2022 REFERRING MD: Triad, CHIEF COMPLAINT: Respiratory distress  BRIEF  47 year old female with refractory hypertension, admitted with community-acquired pneumonia.  She is status post cadaveric renal transplant and she is followed by nephrology.  Admission creatinine was 2.2, course complicated by mechanical ventilation and AKI requiring transient dialysis  Pertinent  Medical History    has a past medical history of Allergy, Anemia associated with chronic renal failure, Anxiety, Asthma, Atypical chest pain, AV (arteriovenous fistula) (HCC), CAD (coronary artery disease) (cardiologist-- dr Sharol Roussel [facility] in [place])), Depression, Elevated lipids, ESRD on hemodialysis (HCC) (NEPHROLOGIST-  DR MATTINGLY), Gastroparesis, GERD (gastroesophageal reflux disease), History of pneumonia, Hyperlipidemia, Hyperthyroidism, Menorrhagia, Other secondary hypertension, Peripheral neuropathy, PONV (postoperative nausea and vomiting), Pre-transplant evaluation for kidney transplant, Renal failure syndrome (03/05/2022), Secondary hyperparathyroidism of renal origin [facility]), Sleep apnea, and Type 2 diabetes mellitus, with long-term current use of insulin (HCC).   has a past surgical history that includes Cesarean section (03/22/2001); AV fistula placement (08/2010); Ligation goretex fistula (01/04/11); Esophagogastroduodenoscopy (10/19/2011); Foot surgery (Left, 2014 approx.); left heart catheterization with coronary angiogram (N/A, 06/27/2012); AV fistula placement (Right, 05/07/2014); AV fistula placement (Right, 07/02/2014); Thrombectomy and revision of arterioventous (av) goretex  graft (Right, 07/16/2014); Cardiac catheterization (Left, 08/08/2014); Cardiac catheterization (Left, 08/08/2014); Ligation of arteriovenous  fistula (Left, 08/21/2014); Cardiac catheterization (Left, 03/08/2016);  transthoracic echocardiogram (06/04/2016); Dobutamine stress echo (06/04/2016    [facility]); Cardiovascular stress test (06/25/2016   [facility]); Dilation and curettage of uterus (05/12/2000); Colonoscopy with esophagogastroduodenoscopy (egd) (10/19/2011); Laparoscopic cholecystectomy (1994); Femur IM nail (Left, child); Hysteroscopy with novasure (N/A, 08/19/2016); Colonoscopy; Polypectomy; Kidney transplant (09/18/2016); Ligation of arteriovenous  fistula (Left, 06/29/2017); and Nephrectomy transplanted organ.     Significant Hospital Events: Including procedures, antibiotic start and stop dates in addition to other pertinent events   10/09/2022 - ADMIT Respiratory culture no growth MRSA PCR negative Blood culture negative Respiratory virus panel: Negative 07/09 Transferred to ICU  07/10: Intubated  BAL: neg as of 7/20 7/13-improving O2 requirement 7/15 worsening renal function, fluid overloaded 7/15-tolerated hemodialysis  7/17-endotracheal tube was exchanged for mucous plugging Trach aspirate-20,000 colonies of Candida albicans [immunosuppressed patient] - 7/18-emergent bronchoscopy for mucous plugging  - Mucous plugging noted this morning, required bagging off the vent and bronchoscopy Was able to respond to name calling, was able to squeeze hands bilateral upper extremities, did not wiggle toes BAL -rare yeast 7/19: Febilr since 7/17 but wbc improved. On vent. On cleviprex due to severe BP along with opral meds -> cleviprex needs increaed. Per phmarcuy oon lot of PO BP medications. Fio2 30%. Culture negative so far.  On PRN fentanyl. SEdation gtt off due to attempts to fully wake her up/ 3.5 hours of dialysis. Mucous plugging noted again at around 4:30 PM despite Mucomyst.  -> ordere  increased to fET tube requent suctioning  10/23/2022: Respiratory reported spontaneous breathing trial attempt but patient continues to have mucous plugs and agitation with WUA ( though followed commands).   Currently on 50% FiO2.  On Cleviprex and Precedex.  Fever continues.  White count increased to 19.7 K. Diarrhea ++ - RN has stiopped lacutlose. No HD today per renal. Giving lasix gtt per renal Started 3% saline neb and scopolamine patch.  Off Mucomyst. 7/21 twice daily Lasix 120  Interim History / Subjective:   Remains critically ill On Precedex and Cleviprex drips Good urine output with Lasix  Afebrile    Objective   Blood pressure (!) 156/78, pulse 89, temperature 99.6 F (37.6 C), temperature source Oral, resp. rate 19, height 5\' 6"  (1.676 m), weight 120.8 kg, SpO2 99%.      Vent Mode: CPAP;PSV FiO2 (%):  [40 %] 40 % Set Rate:  [24 bmp] 24 bmp Vt Set:  [510 mL] 510 mL PEEP:  [5 cmH20] 5 cmH20 Pressure Support:  [12 cmH20] 12 cmH20 Plateau Pressure:  [21 cmH20-24 cmH20] 21 cmH20   Intake/Output Summary (Last 24 hours) at 10/25/2022 1020 Last data filed at 10/25/2022 0600 Gross per 24 hour  Intake 2431.1 ml  Output 1515 ml  Net 916.1 ml   Filed Weights   10/23/22 0500 10/24/22 0400 10/25/22 0500  Weight: 117.7 kg 118.4 kg 120.8 kg    Examination: General Appearance:  Looks criticall ill OBESE - + Head:  Normocephalic, without obvious abnormality, atraumatic Eyes:  PERRL - yes, conjunctiva/corneas - muddy     Ears:  Normal external ear canals, both ears Nose:  G tube - yes  Throat:  ETT TUBE - yes , OG tube - yes Neck:  Supple,  No enlargement/tenderness/nodules Lungs: Bilateral ventilated breath sounds, no accessory muscle use Heart:  S1 and S2 normal, no murmur, CVP - no.  Pressors - no Abdomen:  Soft, no masses, no organomegaly Genitalia / Rectal:  Not done Extremities:  Extremities- intact Skin:  intact in exposed areas . Neurologic: Anxious affect, RASS +1, follows one-step commands   Chest x-ray independently reviewed 7/22 shows bilateral multifocal patchy airspace disease and increased edema pattern Labs show hypokalemia, decreased BUN/creatinine to 67/1.2,  no leukocytosis, stable anemia  Resolved Hospital Problem list   ARDS-result  Assessment & Plan:   Acute hypoxemic respiratory failure ARDS-resolved Mucus plugging needing bronch 10/21/22 and 10/22/22 (s/p mucomst x 2 days ending 7/20)  Weans well on SBT with good tidal volumes but tachypneic, 1 episode of vomiting and increased suction requirements  Pllan -Continue mechanical ventilation , weaning and hopeful for extubation but hold off today -Suction as needed, DC hypertonic saline Continue  scopolamine patch- 10/23/2022   Immunosuppressed status due to CellCept and chronic prednisone and tacrolimus; on Bactrim prophylaxis =- Prior to & Present on Admit Multilobar pneumonia idiopathic   Present on admit (empiric Rx)Completd course for Zosyn  -7/20: Having rising white count and new onset fever.  Repeated mucous plugs.  BAL most recently positive for Candida likely candiat  Plan -Low threshold to restart antibiotics if fever or leukocytosis -Holding off on Diflucan   Acute  encephalopathy -improved  Plan -Off enteral oxycodone and Klonopin since 10/22/2022  - fent IV Prn  Versed prn IV -Continue  precedex gtt , goal RASS 0 to -1   -Continue Abilify - Continue Wellbutrin  Severe Hypertension  7/21: On Cleviprex infusion since 10/17/2022.  On clonidine oral, scheduled along with prazosin scheduled.  Hydralazine as needed, labetalol as needed and Lopressor as needed/ . Per renal still hypervolemic and BP wil be tough to control  Plan  - Taper  Cleviprex to off - change lopressor to coreg - cotninue cloninidine oral - continue parazosin oral - cotninue high dose lasix   History of renal transplant secondary to acute renal failure S/p cadaveric renal transplantation on immunosuppressants -Tacrolimus toxicity Acute kidney injury on chronic kidney disease stage IIIa:  Receiving dialysis for anasarca: With volume removal   Last dialysis was 10/22/2022.  On high dose lasix  since 10/23/22 Plan - per renal -  Continue tacrolimus, continue CellCept - Continue daily prednisone 5mg  per day - -On Bactrim PJP prophylaxis -Monitor urine output through Lasix hig dose   Type 1 diabetes  PLAN  - SSI   Gastroparesis due to DM Ileus onset 10/18/22 and persistent 10/22/22 Diarrhea - s/p flexiseal on 10/19/22 - anti-diarrheals held 10/23/22  10/24/22: No vomiting x 48h. TF restarrted 10/23/22 and tolerating. Diarrhea still ongoind despite dc laxatives 7/20   Plan   -increaes TF to 30cc/h and maintain - Reglan since 10/19/22    Anemia of chronic illness    Plan  - - PRBC for hgb </= 6.9gm%      At risk for QT prolongation with Precedex and Reglan   Plan - Monitor EKG QTc interval as needed -Reduce Reglan to 5 every 8  SKIN  ?new rash chest 10/24/22, stable 7/22  Plan   - monitor    best Practice (right click and "Reselect all SmartList Selections" daily)   Diet/type: tubefeeds DVT prophylaxis: prophylactic heparin  GI prophylaxis: PPI Lines: N/A Foley:  N/A Code Status:  full code Last date of multidisciplinary goals of care discussion [discussed with significant other at bedside 7/18,   Fianc updated at bedside     ATTESTATION & SIGNATURE   The patient Linda Ellison is critically ill with multiple organ systems failure and requires high complexity decision making for assessment and support, frequent evaluation and titration of therapies, application of advanced monitoring technologies and extensive interpretation of multiple databases and discussion with other appropriate health care personnel such as bedside nurses, social workers, case Production designer, theatre/television/film, consultants, respiratory therapists, nutritionists, secretaries etc.,  Critical care time includes but is not restricted to just documentation time. Documentation can happen in parallel or sequential to care time depending on case mix urgency and priorities for the shift.   My independent  critical care time was 38 minutes   Cyril Mourning MD. FCCP. [facility] Pager : [phone]  If no response to pager , please call [phone] until 7 pm After 7:00 pm call Elink  [phone]      10/25/2022 10:20 AM      LABS    PULMONARY Recent Labs  Lab 10/23/22 0602  PHART 7.477*  PCO2ART 34.5  PO2ART 67*  HCO3 25.5  TCO2 27  O2SAT 94    CBC Recent Labs  Lab 10/22/22 1500 10/23/22 0602 10/24/22 0403 10/25/22 0405  HGB 8.4* 8.8* 8.4* 8.2*  HCT 26.8* 26.0* 27.1* 27.0*  WBC 19.7*  --  12.2* 9.6  PLT 321  --  275 288    COAGULATION No results for input(s): "INR" in the last 168 hours.  CARDIAC  No results for input(s): "TROPONINI" in the last 168 hours. No results for input(s): "PROBNP" in the last 168 hours.   CHEMISTRY Recent Labs  Lab 10/19/22 0338 10/20/22 0235 10/21/22 0313 10/22/22 1500 10/23/22 0354 10/23/22 0602 10/23/22 1029 10/24/22 0403 10/25/22 0405  NA 129* 131*   < > 134* 134* 133* 134* 134* 135  K 5.2* 5.2*   < > 3.2* 3.1* 3.5 3.6 3.6 3.0*  CL 92* 92*   < > 95* 97*  --  92* 94* 101  CO2 23 22   < > 23 25  --  22 21* 23  GLUCOSE 132* 134*   < > 184* 122*  --  150* 210* 183*  BUN 111* 133*   < > 117* 66*  --  67* 72* 67*  CREATININE 3.13* 3.86*   < > 2.70* 1.88*  --  1.76* 1.62* 1.23*  CALCIUM 8.3* 8.7*   < > 8.4* 8.4*  --  8.6* 8.4* 8.2*  MG  --   --   --   --  2.4  --   --  2.3 2.1  PHOS 8.2* 9.7*  --  7.5*  --   --   --  4.6 4.0   < > = values in this interval not displayed.   Estimated Creatinine Clearance: 75.7 mL/min (A) (by C-G formula based on SCr of 1.23 mg/dL (H)).   LIVER Recent Labs  Lab 10/20/22 0235 10/22/22 1500 10/23/22 0354 10/24/22 0403 10/25/22 0405  AST  --   --  50* 43* 41  ALT  --   --  44 48* 51*  ALKPHOS  --   --  95 132* 157*  BILITOT  --   --  0.6 1.0 0.8  PROT  --   --  6.4* 6.3* 6.0*  ALBUMIN 2.7* 2.4* 2.4* 2.3* 2.1*     INFECTIOUS Recent Labs  Lab  10/24/22 0403  LATICACIDVEN 0.9     ENDOCRINE CBG (last 3)  Recent Labs    10/24/22 2321 10/25/22 0324 10/25/22 0727  GLUCAP 156* 150* 168*

## 2022-10-25 NOTE — Progress Notes (Signed)
Nutrition Follow-up  DOCUMENTATION CODES:  Obesity unspecified  INTERVENTION:  Continue OGT to suction Tube feeding via post-pyloric cortrak: Vital AF 1.2 goal rate of 60 ml/h (1440 ml per day) Increase to 45mL/h and hold. If no issues with vomiting, advance to goal 7/23 At goal, provides 1728 kcal, 108 gm protein, 1168 ml free water daily  NUTRITION DIAGNOSIS:  Inadequate oral intake related to inability to eat as evidenced by NPO status. - Ongoing   GOAL:  Provide needs based on ASPEN/SCCM guidelines  - Ongoing   MONITOR:  TF tolerance, I & O's, Vent status, Labs  REASON FOR ASSESSMENT:  Rounds, Ventilator, Consult Enteral/tube feeding initiation and management  ASSESSMENT:  Pt with hx of prior ESRD on HD now with renal transplant 2018, DM type 2, HTN, HLD, CAD, gastroparesis, and GERD presented to ED with weakness and SOB. Imaging suggestive of pneumonia.  7/6 - admitted 7/9 - worsening SOB, transferred to ICU 7/10 - intubated, bronchoscopy; trickle TF started 7/12 - TF started advancing to goal 7/15 - TF held and OGT hooked to suction after TF noted in oral cavity, HD initiated 7/17 - pt with vomiting again overnight, OGT back to suction 7/18 - bronchoscopy, mucus plugging 7/19 - cortrak placed post-pyloric (proximal jejunum)  Pt remains intubated on vent support. Pt with TF infusing at 33mL/h and has been tolerating the increase from trickles over the weekend. Discussed with RN, states that abdomen does not have worsening distention and seems to be tolerating well. Discussed with MD, ok to increase TF rate. If pt tolerates, will advance to goal tomorrow. OGT currently clamped.   Last HD: 7/19 - 4L UF  MV: 11.1 L/min Temp (24hrs), Avg:99.6 F (37.6 C), Min:98.2 F (36.8 C), Max:101 F (38.3 C)   Intake/Output Summary (Last 24 hours) at 10/25/2022 1309 Last data filed at 10/25/2022 1610 Gross per 24 hour  Intake 2140.83 ml  Output 1550 ml  Net 590.83 ml  Net IO  Since Admission: 12,419.89 mL [10/25/22 1309]  Nutritionally Relevant Medications: Scheduled Meds:  atorvastatin  10 mg Daily   famotidine  20 mg Daily   folic acid  1 mg Daily   insulin aspart  0-20 Units Q4H   insulin detemir  8 Units Daily   metoCLOPramide (REGLAN) injection  5 mg Q8H   multivitamin with minerals  1 tablet Daily   mycophenolate  1,000 mg BID   potassium chloride  40 mEq Daily   predniSONE  5 mg Q breakfast   spironolactone  25 mg Daily   sulfamethoxazole-trimethoprim  1 tablet Once daily Monday Wednesday Friday   tacrolimus  1.5 mg BID   Continuous Infusions:  clevidipine 19 mg/hr (10/25/22 0618)   dexmedetomidine (PRECEDEX) IV infusion 1.2 mcg/kg/hr (10/25/22 0600)   feeding supplement (VITAL AF 1.2 CAL) 30 mL/hr at 10/25/22 0600   furosemide 120 mg (10/25/22 0913)   Labs Reviewed: K 3.0 BUN 67, creatinine 1.23 CBG ranges from 149-202 mg/dL over the last 24 hours HgbA1c 6.7% (5/24)  Diet Order:   Diet Order             Diet NPO time specified  Diet effective now                   EDUCATION NEEDS:  Not appropriate for education at this time  Skin:  Skin Assessment: Reviewed RN Assessment  Last BM:  7/21 - type 6 FMS: 65mL out in the last 24 hours  Height:  Ht  Readings from Last 1 Encounters:  10/21/22 5\' 6"  (1.676 m)   Weight:  Wt Readings from Last 1 Encounters:  10/25/22 120.8 kg   Ideal Body Weight:  59.1 kg  BMI:  Body mass index is 42.98 kg/m.  Estimated Nutritional Needs:  Kcal:  1800-2100kcal/d Protein:  100-120g/d Fluid:  1.8L/d   Greig Castilla, RD, LDN Clinical Dietitian RD pager # available in [facility]  After hours/weekend pager # available in [facility]

## 2022-10-25 NOTE — Progress Notes (Signed)
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  Subjective:  Last HD 7/19 1.8L UOP augmented with lasix Scr is at baseline  07/21 0701 - 07/22 0700 In: 2817.8 [I.V.:1854.6; NG/GT:809.3; IV Piggyback:123.9] Out: 1815 [Urine:1750; Stool:65]  Filed Weights   10/23/22 0500 10/24/22 0400 10/25/22 0500  Weight: 117.7 kg 118.4 kg 120.8 kg    Scheduled Meds:  amLODipine  10 mg Per Tube Daily   arformoterol  15 mcg Nebulization BID   ARIPiprazole  30 mg Per Tube Daily   atorvastatin  10 mg Per Tube Daily   buPROPion  225 mg Per Tube BID   carvedilol  12.5 mg Per Tube Q12H   cloNIDine  0.3 mg Per Tube TID   famotidine  20 mg Per Tube Daily   folic acid  1 mg Per Tube Daily   guaiFENesin  400 mg Per Tube Q4H   heparin  5,000 Units Subcutaneous Q8H   hydrALAZINE  100 mg Per Tube Q8H   hydrOXYzine  25 mg Per Tube QHS   insulin aspart  0-20 Units Subcutaneous Q4H   insulin detemir  8 Units Subcutaneous Daily   metoCLOPramide (REGLAN) injection  10 mg Intravenous Q6H   montelukast  10 mg Per Tube QHS   multivitamin with minerals  1 tablet Per Tube Daily   mycophenolate  1,000 mg Per Tube BID   mouth rinse  15 mL Mouth Rinse Q2H   potassium chloride  40 mEq Per Tube Daily   prazosin  1 mg Per Tube QHS   predniSONE  5 mg Per Tube Q breakfast   revefenacin  175 mcg Nebulization Daily   scopolamine  1 patch Transdermal Q72H   spironolactone  25 mg Per Tube Daily   sulfamethoxazole-trimethoprim  1 tablet Per Tube Once per day on Monday Wednesday Friday   tacrolimus  1.5 mg Sublingual BID   Continuous Infusions:  sodium chloride 10 mL/hr at 10/25/22 0600   anticoagulant sodium citrate     clevidipine 19 mg/hr (10/25/22 0618)   dexmedetomidine (PRECEDEX) IV infusion 1.2 mcg/kg/hr (10/25/22 0600)   feeding supplement (VITAL AF 1.2 CAL) 30 mL/hr at 10/25/22 0600   furosemide 120 mg (10/25/22 0913)   potassium chloride 10 mEq (10/25/22 0954)   PRN  Meds:.sodium chloride, acetaminophen (TYLENOL) oral liquid 160 mg/5 mL, albuterol, alteplase, anticoagulant sodium citrate, fentaNYL (SUBLIMAZE) injection, guaiFENesin, heparin, heparin, hydrALAZINE, labetalol, midazolam, mouth rinse, oxyCODONE  Current Labs: reviewed today  Physical Exam:  Blood pressure (!) 156/78, pulse 89, temperature 99.6 F (37.6 C), temperature source Oral, resp. rate 19, height 5\' 6"  (1.676 m), weight 120.8 kg, SpO2 99%. GEN: chronically ill appearing, lying in bed, nad ENT: no nasal discharge, mmm EYES: no scleral icterus, eomi CV: normal rate, no rub PULM: ventilated, bilateral chest rise ABD: NABS, non-distended SKIN: no rashes or jaundice EXT: diffuse edema, warm and well perfused   A AKI on CKD3a in setting of ddKT 2018 (BL SCr 1.4-1.6) baseline IS Tac/MMF/pred Started HD 7/15 for refractory hypervolemia and hyperkalemia, last Tx 7/19 Using R internal jugular Temp HD cath placed 7/15 by CCM Continue holding HD Azotemia, as above; improved with HD, now crt recovering Hyperkalemia,resolved with HD Hypokalemia AHRF / Acute asthma exacerbation PCP excluded, off high doseTMP/SMX; ABX per CCM Inbutaed 7/10, prolonged  IS, using Tac SL 1.5mg  BID (inc 7/19 with low 12h trhough), on cellcept now (go back to myfortic when tol PO); higher dose steriod from pulm  indications; back on three times per week TMP/SMX AGMA, cont HD HTN: on  clevipres, meds uptitrated by CCM, trend with UF Severe hypervolemia / anasarca as above  P Ad above, cont BID lasix today 120 IV Consider increase dose tomorrow Add on metolazone once K improves Start spironolactone 25mg  daily; uptitrate as needed Likely remove HD cath tomorrow All questions answered for mother Medication Issues; Preferred narcotic agents for pain control are hydromorphone, fentanyl, and methadone. Morphine should not be used.  Baclofen should be avoided Avoid oral sodium phosphate and magnesium citrate based  laxatives / bowel preps    Darnell Level  10/25/2022, 10:31 AM  Recent Labs  Lab 10/22/22 1500 10/23/22 0354 10/23/22 1029 10/24/22 0403 10/25/22 0405  NA 134*   < > 134* 134* 135  K 3.2*   < > 3.6 3.6 3.0*  CL 95*   < > 92* 94* 101  CO2 23   < > 22 21* 23  GLUCOSE 184*   < > 150* 210* 183*  BUN 117*   < > 67* 72* 67*  CREATININE 2.70*   < > 1.76* 1.62* 1.23*  CALCIUM 8.4*   < > 8.6* 8.4* 8.2*  PHOS 7.5*  --   --  4.6 4.0   < > = values in this interval not displayed.   Recent Labs  Lab 10/22/22 1500 10/23/22 0602 10/24/22 0403 10/25/22 0405  WBC 19.7*  --  12.2* 9.6  NEUTROABS 17.6*  --   --   --   HGB 8.4* 8.8* 8.4* 8.2*  HCT 26.8* 26.0* 27.1* 27.0*  MCV 81.7  --  84.2 82.6  PLT 321  --  275 288

## 2022-10-25 NOTE — Progress Notes (Signed)
eLink Physician-Brief Progress Note Patient Name: Linda Ellison DOB: [birth year] MRN: [id number]   Date of Service  10/25/2022  HPI/Events of Note  47 year old female that presented with ARDS in the setting of hypoxic respiratory failure and multilobar pneumonia.  Potassium 3.0.  eICU Interventions  KCl ordered via IV and enteral tube     Intervention Category Minor Interventions: Electrolytes abnormality - evaluation and management  Tasheika Kitzmiller 10/25/2022, 6:33 AM

## 2022-10-25 NOTE — Plan of Care (Signed)
  Problem: Metabolic: Goal: Ability to maintain appropriate glucose levels will improve Outcome: Progressing   Problem: Skin Integrity: Goal: Risk for impaired skin integrity will decrease Outcome: Progressing   Problem: Elimination: Goal: Will not experience complications related to urinary retention Outcome: Progressing   Problem: Pain Managment: Goal: General experience of comfort will improve Outcome: Progressing

## 2022-10-26 DIAGNOSIS — N179 Acute kidney failure, unspecified: Secondary | ICD-10-CM | POA: Diagnosis not present

## 2022-10-26 DIAGNOSIS — J189 Pneumonia, unspecified organism: Secondary | ICD-10-CM | POA: Diagnosis not present

## 2022-10-26 DIAGNOSIS — J9601 Acute respiratory failure with hypoxia: Secondary | ICD-10-CM | POA: Diagnosis not present

## 2022-10-26 LAB — BASIC METABOLIC PANEL
Anion gap: 11 (ref 5–15)
BUN: 44 mg/dL — ABNORMAL HIGH (ref 6–20)
CO2: 24 mmol/L (ref 22–32)
Calcium: 8.3 mg/dL — ABNORMAL LOW (ref 8.9–10.3)
Chloride: 106 mmol/L (ref 98–111)
Creatinine, Ser: 0.87 mg/dL (ref 0.44–1.00)
GFR, Estimated: >60 mL/min (ref >60)
Glucose, Bld: 180 mg/dL — ABNORMAL HIGH (ref 70–99)
Potassium: 3.5 mmol/L (ref 3.5–5.1)
Sodium: 141 mmol/L (ref 135–145)

## 2022-10-26 LAB — RENAL FUNCTION PANEL
Albumin: 2.1 g/dL — ABNORMAL LOW (ref 3.5–5.0)
Anion gap: 9 (ref 5–15)
BUN: 48 mg/dL — ABNORMAL HIGH (ref 6–20)
CO2: 23 mmol/L (ref 22–32)
Calcium: 8.1 mg/dL — ABNORMAL LOW (ref 8.9–10.3)
Chloride: 106 mmol/L (ref 98–111)
Creatinine, Ser: 0.9 mg/dL (ref 0.44–1.00)
GFR, Estimated: >60 mL/min (ref >60)
Glucose, Bld: 199 mg/dL — ABNORMAL HIGH (ref 70–99)
Phosphorus: 3.4 mg/dL (ref 2.5–4.6)
Potassium: 3.3 mmol/L — ABNORMAL LOW (ref 3.5–5.1)
Sodium: 138 mmol/L (ref 135–145)

## 2022-10-26 LAB — GLUCOSE, CAPILLARY
Glucose-Capillary: 121 mg/dL — ABNORMAL HIGH (ref 70–99)
Glucose-Capillary: 123 mg/dL — ABNORMAL HIGH (ref 70–99)
Glucose-Capillary: 163 mg/dL — ABNORMAL HIGH (ref 70–99)
Glucose-Capillary: 169 mg/dL — ABNORMAL HIGH (ref 70–99)
Glucose-Capillary: 181 mg/dL — ABNORMAL HIGH (ref 70–99)
Glucose-Capillary: 181 mg/dL — ABNORMAL HIGH (ref 70–99)

## 2022-10-26 LAB — TACROLIMUS LEVEL: Tacrolimus (FK506) - LabCorp: 1 ng/mL — ABNORMAL LOW (ref 2.0–20.0)

## 2022-10-26 LAB — TRIGLYCERIDES: Triglycerides: 158 mg/dL — ABNORMAL HIGH (ref <150)

## 2022-10-26 MED ORDER — FUROSEMIDE 10 MG/ML IJ SOLN
120.0000 mg | Freq: Three times a day (TID) | INTRAVENOUS | Status: DC
  Administered 2022-10-26 (×2): 120 mg via INTRAVENOUS
  Filled 2022-10-26: qty 12
  Filled 2022-10-26: qty 10
  Filled 2022-10-26 (×3): qty 12

## 2022-10-26 MED ORDER — SPIRONOLACTONE 25 MG PO TABS
50.0000 mg | ORAL_TABLET | Freq: Every day | ORAL | Status: DC
  Administered 2022-10-27: 50 mg
  Filled 2022-10-26: qty 2

## 2022-10-26 MED ORDER — ORAL CARE MOUTH RINSE
15.0000 mL | OROMUCOSAL | Status: DC
  Administered 2022-10-26 – 2022-11-04 (×35): 15 mL via OROMUCOSAL

## 2022-10-26 MED ORDER — VITAL AF 1.2 CAL PO LIQD
1000.0000 mL | ORAL | Status: DC
  Administered 2022-10-26 – 2022-10-30 (×5): 1000 mL
  Filled 2022-10-26: qty 1000

## 2022-10-26 MED ORDER — ORAL CARE MOUTH RINSE: 15.0000 mL | OROMUCOSAL | Status: DC | PRN

## 2022-10-26 MED ORDER — RACEPINEPHRINE HCL 2.25 % IN NEBU
0.5000 mL | INHALATION_SOLUTION | Freq: Once | RESPIRATORY_TRACT | Status: AC
  Administered 2022-10-26: 0.5 mL via RESPIRATORY_TRACT
  Filled 2022-10-26 (×2): qty 0.5

## 2022-10-26 MED ORDER — SPIRONOLACTONE 25 MG PO TABS
25.0000 mg | ORAL_TABLET | Freq: Once | ORAL | Status: AC
  Administered 2022-10-26: 25 mg via ORAL
  Filled 2022-10-26: qty 1

## 2022-10-26 NOTE — Progress Notes (Addendum)
NAME:  Linda Ellison, MRN:  [id number], DOB:  05-Mar-1976, LOS: 16 ADMISSION DATE:  10/09/2022, CONSULTATION DATE: 10/12/2022 REFERRING MD: Triad, CHIEF COMPLAINT: Respiratory distress  BRIEF  47 year old female with refractory hypertension, admitted with community-acquired pneumonia.  She is status post cadaveric renal transplant and she is followed by nephrology.  Admission creatinine was 2.2, course complicated by mechanical ventilation and AKI requiring transient dialysis  Pertinent  Medical History    has a past medical history of Allergy, Anemia associated with chronic renal failure, Anxiety, Asthma, Atypical chest pain, AV (arteriovenous fistula) (HCC), CAD (coronary artery disease) (cardiologist-- dr Sharol Roussel [facility] [place] [facility] in [place])), Depression, Elevated lipids, ESRD on hemodialysis (HCC) (NEPHROLOGIST-  DR MATTINGLY), Gastroparesis, GERD (gastroesophageal reflux disease), History of pneumonia, Hyperlipidemia, Hyperthyroidism, Menorrhagia, Other secondary hypertension, Peripheral neuropathy, PONV (postoperative nausea and vomiting), Pre-transplant evaluation for kidney transplant, Renal failure syndrome (03/05/2022), Secondary hyperparathyroidism of renal origin [facility]), Sleep apnea, and Type 2 diabetes mellitus, with long-term current use of insulin (HCC).   has a past surgical history that includes Cesarean section (03/22/2001); AV fistula placement (08/2010); Ligation goretex fistula (01/04/11); Esophagogastroduodenoscopy (10/19/2011); Foot surgery (Left, 2014 approx.); left heart catheterization with coronary angiogram (N/A, 06/27/2012); AV fistula placement (Right, 05/07/2014); AV fistula placement (Right, 07/02/2014); Thrombectomy and revision of arterioventous (av) goretex  graft (Right, 07/16/2014); Cardiac catheterization (Left, 08/08/2014); Cardiac catheterization (Left, 08/08/2014); Ligation of arteriovenous  fistula (Left, 08/21/2014); Cardiac catheterization (Left, 03/08/2016);  transthoracic echocardiogram (06/04/2016); Dobutamine stress echo (06/04/2016    [facility]); Cardiovascular stress test (06/25/2016   [facility]); Dilation and curettage of uterus (05/12/2000); Colonoscopy with esophagogastroduodenoscopy (egd) (10/19/2011); Laparoscopic cholecystectomy (1994); Femur IM nail (Left, child); Hysteroscopy with novasure (N/A, 08/19/2016); Colonoscopy; Polypectomy; Kidney transplant (09/18/2016); Ligation of arteriovenous  fistula (Left, 06/29/2017); and Nephrectomy transplanted organ.     Significant Hospital Events: Including procedures, antibiotic start and stop dates in addition to other pertinent events   10/09/2022 - ADMIT Respiratory culture no growth MRSA PCR negative Blood culture negative Respiratory virus panel: Negative 07/09 Transferred to ICU  07/10: Intubated  BAL: neg as of 7/20 7/13-improving O2 requirement 7/15 worsening renal function, fluid overloaded 7/15-tolerated hemodialysis  7/17-endotracheal tube was exchanged for mucous plugging Trach aspirate-20,000 colonies of Candida albicans [immunosuppressed patient] - 7/18-emergent bronchoscopy for mucous plugging  - Mucous plugging noted this morning, required bagging off the vent and bronchoscopy Was able to respond to name calling, was able to squeeze hands bilateral upper extremities, did not wiggle toes BAL -rare yeast 7/19: Febilr since 7/17 but wbc improved. On vent. On cleviprex due to severe BP along with opral meds -> cleviprex needs increaed. Per phmarcuy oon lot of PO BP medications. Fio2 30%. Culture negative so far.  On PRN fentanyl. SEdation gtt off due to attempts to fully wake her up/ 3.5 hours of dialysis. Mucous plugging noted again at around 4:30 PM despite Mucomyst.  -> ordere  increased to fET tube requent suctioning  10/23/2022: Respiratory reported spontaneous breathing trial attempt but patient continues to have mucous plugs and agitation with WUA ( though followed commands).   Currently on 50% FiO2.  On Cleviprex and Precedex.  Fever continues.  White count increased to 19.7 K. Diarrhea ++ - RN has stiopped lacutlose. No HD today per renal. Giving lasix gtt per renal Started 3% saline neb and scopolamine patch.  Off Mucomyst. 7/21 twice daily Lasix 120 7/22 weaned on PS, copious secretions  Interim History / Subjective:   Remains critically ill Low-grade febrile. Suction requirements  have decreased.  On Precedex 1.5 Good urine output with Lasix   Objective   Blood pressure (!) 165/66, pulse 86, temperature (!) 100.6 F (38.1 C), temperature source Oral, resp. rate (!) 24, height 5\' 6"  (1.676 m), weight 122.9 kg, SpO2 100%.      Vent Mode: PRVC FiO2 (%):  [40 %] 40 % Set Rate:  [20 bmp-24 bmp] 20 bmp Vt Set:  [510 mL] 510 mL PEEP:  [5 cmH20] 5 cmH20 Plateau Pressure:  [21 cmH20-27 cmH20] 21 cmH20   Intake/Output Summary (Last 24 hours) at 10/26/2022 1023 Last data filed at 10/26/2022 0700 Gross per 24 hour  Intake 2588.19 ml  Output 2800 ml  Net -211.81 ml   Filed Weights   10/24/22 0400 10/25/22 0500 10/26/22 0500  Weight: 118.4 kg 120.8 kg 122.9 kg    Examination: General Appearance:   critically ill OBESE - + Head:  Normocephalic, without obvious abnormality, atraumatic Eyes:  PERRL - yes, conjunctiva/corneas - muddy     Ears:  Normal external ear canals, both ears Nose:  G tube - yes  Throat:  ETT TUBE - yes , OG tube - yes Neck:  Supple,  No enlargement/tenderness/nodules Lungs: No accessory muscle use, bilateral ventilated breath sounds Heart:  S1 and S2 normal, no murmur, CVP - no.  Pressors - no Abdomen:  Soft, no masses, no organomegaly Genitalia / Rectal:  Not done Extremities:  Extremities- intact Skin:  intact in exposed areas . Neurologic: Follows one-step commands, RASS +1, anxious affect   Chest x-ray independently reviewed 7/22 shows bilateral multifocal patchy airspace disease and increased edema pattern Labs show  hypokalemia, decreased BUN/creatinine to 48/0.9, low albumin 2.1  Resolved Hospital Problem list   ARDS-result  Assessment & Plan:   Acute hypoxemic respiratory failure ARDS-resolved Mucus plugging needing bronch 10/21/22 and 10/22/22 (s/p mucomst x 2 days ending 7/20)  Weans well on SBT with good tidal volumes , suction requirements have decreased  Pllan -Proceed with extubation -Tracheobronchial toilet, incentive spirometry Can DC scopolamine patch   Immunosuppressed status due to CellCept and chronic prednisone and tacrolimus; on Bactrim prophylaxis =- Prior to & Present on Admit Multilobar pneumonia idiopathic   Present on admit (empiric Rx)Completd course for Zosyn  -7/20: Having rising white count and new onset fever.  Repeated mucous plugs.  BAL most recently positive for Candida likely candiat  Plan -Repeat blood cultures for low-grade fever -Low threshold to restart antibiotics  -Holding off on Diflucan -DC arterial line and HD catheter   Acute  encephalopathy -improved  Plan -Off enteral oxycodone and Klonopin since 10/22/2022  - fent IV Prn  Versed prn IV -Continue  precedex gtt , goal RASS 0 to -1   -Continue Abilify - Continue Wellbutrin  Severe Hypertension  7/21: On Cleviprex infusion since 10/17/2022.  On clonidine oral, scheduled along with prazosin scheduled.  Hydralazine as needed, labetalol as needed and Lopressor as needed/ . Per renal still hypervolemic and BP wil be tough to control  Plan  - Taper  Cleviprex to off - change lopressor to coreg - continue cloninidine oral - continue parazosin oral    History of renal transplant secondary to acute renal failure S/p cadaveric renal transplantation on immunosuppressants -Tacrolimus toxicity Acute kidney injury on chronic kidney disease stage IIIa:  Receiving dialysis for anasarca: With volume removal   Last dialysis was 10/22/2022.  On high dose lasix since 10/23/22 Plan - per renal, Lasix  increased to 120 3 times daily and spironolactone  increased to 50 mg daily -Continue tacrolimus, continue CellCept - Continue daily prednisone 5mg  per day - -On Bactrim PJP prophylaxis -Monitor urine output through Lasix hig dose -Discontinue HD catheter   Type 1 diabetes  PLAN  - SSI   Gastroparesis due to DM Ileus onset 10/18/22 and persistent 10/22/22 Diarrhea - s/p flexiseal on 10/19/22 - anti-diarrheals held 10/23/22  10/24/22: No vomiting x 48h. TF restarrted 10/23/22 and tolerating. Diarrhea still ongoind despite dc laxatives 7/20   Plan   -Tolerating tube feeds - Reglan since 10/19/22    Anemia of chronic illness    Plan  - - PRBC for hgb </= 6.9gm%      At risk for QT prolongation with Precedex and Reglan   Plan - Monitor EKG QTc interval as needed -Reduce Reglan to 5 every 8  SKIN  ?new rash chest 10/24/22, stable 7/22  Plan   - monitor    best Practice (right click and "Reselect all SmartList Selections" daily)   Diet/type: tubefeeds DVT prophylaxis: prophylactic heparin  GI prophylaxis: PPI Lines: N/A Foley:  N/A Code Status:  full code Last date of multidisciplinary goals of care discussion [discussed with significant other at bedside 7/18,   Fianc updated at bedside     ATTESTATION & SIGNATURE   The patient Linda Ellison is critically ill with multiple organ systems failure and requires high complexity decision making for assessment and support, frequent evaluation and titration of therapies, application of advanced monitoring technologies and extensive interpretation of multiple databases and discussion with other appropriate health care personnel such as bedside nurses, social workers, case Production designer, theatre/television/film, consultants, respiratory therapists, nutritionists, secretaries etc.,  Critical care time includes but is not restricted to just documentation time. Documentation can happen in parallel or sequential to care time depending on case mix urgency  and priorities for the shift.   My independent critical care time was 35 minutes   Cyril Mourning MD. FCCP. [facility] Pager : [phone]  If no response to pager , please call [phone] until 7 pm After 7:00 pm call Elink  [phone]      10/26/2022 10:23 AM      LABS    PULMONARY Recent Labs  Lab 10/23/22 0602  PHART 7.477*  PCO2ART 34.5  PO2ART 67*  HCO3 25.5  TCO2 27  O2SAT 94    CBC Recent Labs  Lab 10/22/22 1500 10/23/22 0602 10/24/22 0403 10/25/22 0405  HGB 8.4* 8.8* 8.4* 8.2*  HCT 26.8* 26.0* 27.1* 27.0*  WBC 19.7*  --  12.2* 9.6  PLT 321  --  275 288    COAGULATION No results for input(s): "INR" in the last 168 hours.  CARDIAC  No results for input(s): "TROPONINI" in the last 168 hours. No results for input(s): "PROBNP" in the last 168 hours.   CHEMISTRY Recent Labs  Lab 10/20/22 0235 10/21/22 0313 10/22/22 1500 10/23/22 0354 10/23/22 0602 10/23/22 1029 10/24/22 0403 10/25/22 0405 10/26/22 0750  NA 131*   < > 134* 134* 133* 134* 134* 135 138  K 5.2*   < > 3.2* 3.1* 3.5 3.6 3.6 3.0* 3.3*  CL 92*   < > 95* 97*  --  92* 94* 101 106  CO2 22   < > 23 25  --  22 21* 23 23  GLUCOSE 134*   < > 184* 122*  --  150* 210* 183* 199*  BUN 133*   < > 117* 66*  --  67* 72* 67* 48*  CREATININE 3.86*   < > 2.70* 1.88*  --  1.76* 1.62* 1.23* 0.90  CALCIUM 8.7*   < > 8.4* 8.4*  --  8.6* 8.4* 8.2* 8.1*  MG  --   --   --  2.4  --   --  2.3 2.1  --   PHOS 9.7*  --  7.5*  --   --   --  4.6 4.0 3.4   < > = values in this interval not displayed.   Estimated Creatinine Clearance: 104.4 mL/min (by C-G formula based on SCr of 0.9 mg/dL).   LIVER Recent Labs  Lab 10/22/22 1500 10/23/22 0354 10/24/22 0403 10/25/22 0405 10/26/22 0750  AST  --  50* 43* 41  --   ALT  --  44 48* 51*  --   ALKPHOS  --  95 132* 157*  --   BILITOT  --  0.6 1.0 0.8  --   PROT  --  6.4* 6.3* 6.0*  --   ALBUMIN 2.4* 2.4* 2.3* 2.1* 2.1*      INFECTIOUS Recent Labs  Lab 10/24/22 0403  LATICACIDVEN 0.9     ENDOCRINE CBG (last 3)  Recent Labs    10/25/22 2328 10/26/22 0325 10/26/22 0733  GLUCAP 178* 169* 181*

## 2022-10-26 NOTE — Progress Notes (Signed)
PT Cancellation Note  Patient Details Name: DENIESHA STENGLEIN MRN: [id number] DOB: [birth year]   Cancelled Treatment:    Reason Eval/Treat Not Completed: Medical issues which prohibited therapy. PT attempted to see pt this afternoon however pt with stridor since extubation and preparing to receive epinephrine per RN at the time of PT arrival. Latest vital signs indicate pt now back on BiPAP. PT will follow up tomorrow.   Arlyss Gandy 10/26/2022, 4:05 PM

## 2022-10-26 NOTE — Procedures (Signed)
Extubation Procedure Note  Patient Details:   Name: Linda Ellison DOB: 09/09/1975 MRN: [id number]   Airway Documentation:    Vent end date: 10/26/22 Vent end time: 1147   Evaluation  O2 sats: currently acceptable Complications: No apparent complications Patient did tolerate procedure well. Bilateral Breath Sounds: Rhonchi, Diminished   Extubated per MD order & placed on 6L [place]. Patient is able to speak & has good strong cough. Patient currently in no distress.  Jacqulynn Cadet 10/26/2022, 11:56 AM

## 2022-10-26 NOTE — Progress Notes (Signed)
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  Subjective:  Last HD 7/19 UOP up to 2.8 w/ lasix Scr is at baseline  07/22 0701 - 07/23 0700 In: 2588.2 [I.V.:1493.1; NG/GT:971; IV Piggyback:124.1] Out: 2835 [Urine:2800; Stool:35]  Filed Weights   10/24/22 0400 10/25/22 0500 10/26/22 0500  Weight: 118.4 kg 120.8 kg 122.9 kg    Scheduled Meds:  amLODipine  10 mg Per Tube Daily   arformoterol  15 mcg Nebulization BID   ARIPiprazole  30 mg Per Tube Daily   atorvastatin  10 mg Per Tube Daily   buPROPion  225 mg Per Tube BID   carvedilol  12.5 mg Per Tube Q12H   cloNIDine  0.3 mg Per Tube TID   famotidine  20 mg Per Tube Daily   folic acid  1 mg Per Tube Daily   guaiFENesin  400 mg Per Tube Q4H   heparin  5,000 Units Subcutaneous Q8H   hydrALAZINE  100 mg Per Tube Q8H   hydrOXYzine  25 mg Per Tube QHS   insulin aspart  0-20 Units Subcutaneous Q4H   insulin detemir  8 Units Subcutaneous Daily   metoCLOPramide (REGLAN) injection  5 mg Intravenous Q8H   montelukast  10 mg Per Tube QHS   multivitamin with minerals  1 tablet Per Tube Daily   mycophenolate  1,000 mg Per Tube BID   mouth rinse  15 mL Mouth Rinse Q2H   potassium chloride  40 mEq Per Tube Daily   prazosin  1 mg Per Tube QHS   predniSONE  5 mg Per Tube Q breakfast   revefenacin  175 mcg Nebulization Daily   scopolamine  1 patch Transdermal Q72H   [START ON 10/27/2022] spironolactone  50 mg Per Tube Daily   sulfamethoxazole-trimethoprim  1 tablet Per Tube Once per day on Monday Wednesday Friday   tacrolimus  1.5 mg Sublingual BID   Continuous Infusions:  sodium chloride Stopped (10/26/22 0530)   anticoagulant sodium citrate     clevidipine 6 mg/hr (10/26/22 0700)   dexmedetomidine (PRECEDEX) IV infusion 1.501 mcg/kg/hr (10/26/22 0831)   feeding supplement (VITAL AF 1.2 CAL) Stopped (10/26/22 0743)   furosemide     PRN Meds:.sodium chloride, acetaminophen (TYLENOL) oral liquid 160  mg/5 mL, albuterol, alteplase, anticoagulant sodium citrate, fentaNYL (SUBLIMAZE) injection, guaiFENesin, heparin, heparin, hydrALAZINE, labetalol, midazolam, mouth rinse, oxyCODONE  Current Labs: reviewed today  Physical Exam:  Blood pressure (!) 165/66, pulse 86, temperature (!) 100.6 F (38.1 C), temperature source Oral, resp. rate (!) 24, height 5\' 6"  (1.676 m), weight 122.9 kg, SpO2 100%. GEN: chronically ill appearing, lying in bed, nad ENT: no nasal discharge, mmm EYES: no scleral icterus, eomi CV: normal rate, no rub PULM: ventilated, bilateral chest rise ABD: NABS, non-distended SKIN: no rashes or jaundice EXT: diffuse edema, warm and well perfused   A AKI on CKD3a in setting of ddKT 2018 (BL SCr 1.4-1.6) baseline IS Tac/MMF/pred Started HD 7/15 for refractory hypervolemia and hyperkalemia, last Tx 7/19 Likely remove HD cath as soon as able given recovery Continue holding HD Azotemia, as above; improved with HD, now crt recovering Hyperkalemia,resolved with HD Hypokalemia AHRF / Acute asthma exacerbation PCP excluded, off high doseTMP/SMX; ABX per CCM Inbutaed 7/10, prolonged  IS, using Tac SL 1.5mg  BID (inc 7/19 with low 12h trhough), on cellcept now (go back to myfortic when tol PO); higher dose steriod from pulm indications; back on three times per week TMP/SMX Resolved  HTN: on  clevipres, meds uptitrated by CCM, started spiro, diuresis Severe hypervolemia / anasarca as above  P Increase lasix to 120mg  TID Increase spironolactone to 50mg  daily Consider metolazone once K improved Recheck BMP at 1400 All questions answered for mother Medication Issues; Preferred narcotic agents for pain control are hydromorphone, fentanyl, and methadone. Morphine should not be used.  Baclofen should be avoided Avoid oral sodium phosphate and magnesium citrate based laxatives / bowel preps    Darnell Level  10/26/2022, 9:32 AM  Recent Labs  Lab 10/24/22 0403 10/25/22 0405  10/26/22 0750  NA 134* 135 138  K 3.6 3.0* 3.3*  CL 94* 101 106  CO2 21* 23 23  GLUCOSE 210* 183* 199*  BUN 72* 67* 48*  CREATININE 1.62* 1.23* 0.90  CALCIUM 8.4* 8.2* 8.1*  PHOS 4.6 4.0 3.4   Recent Labs  Lab 10/22/22 1500 10/23/22 0602 10/24/22 0403 10/25/22 0405  WBC 19.7*  --  12.2* 9.6  NEUTROABS 17.6*  --   --   --   HGB 8.4* 8.8* 8.4* 8.2*  HCT 26.8* 26.0* 27.1* 27.0*  MCV 81.7  --  84.2 82.6  PLT 321  --  275 288

## 2022-10-26 NOTE — Progress Notes (Signed)
Brief Nutrition Support Note  Pt able to be extubated this AM. TF held prior to extubation and remain off at this time. Discussed with MD and expressed concern with pt's lack of full nutrition since being on the ventilator because of intolerance. Cortrak now in the jejunum. MD ok with restarting feeds this evening if pt unable to have diet advanced. Called and discussed with RN on unit.  Recommend resuming tube feeding via post-pyloric cortrak: Vital AF 1.2 goal rate of 60 ml/h (1440 ml per day) Start at 30 and increase by 10mL q8h to goal of 60 At goal, provides 1728 kcal, 108 gm protein, 1168 ml free water daily  Greig Castilla, RD, LDN Clinical Dietitian RD pager # available in [facility]  After hours/weekend pager # available in [facility]

## 2022-10-27 ENCOUNTER — Inpatient Hospital Stay (HOSPITAL_COMMUNITY): Payer: Medicare (Managed Care)

## 2022-10-27 DIAGNOSIS — J189 Pneumonia, unspecified organism: Secondary | ICD-10-CM | POA: Diagnosis not present

## 2022-10-27 DIAGNOSIS — N179 Acute kidney failure, unspecified: Secondary | ICD-10-CM | POA: Diagnosis not present

## 2022-10-27 DIAGNOSIS — J9601 Acute respiratory failure with hypoxia: Secondary | ICD-10-CM | POA: Diagnosis not present

## 2022-10-27 LAB — BASIC METABOLIC PANEL
Anion gap: 10 (ref 5–15)
BUN: 31 mg/dL — ABNORMAL HIGH (ref 6–20)
CO2: 26 mmol/L (ref 22–32)
Calcium: 8.4 mg/dL — ABNORMAL LOW (ref 8.9–10.3)
Chloride: 107 mmol/L (ref 98–111)
Creatinine, Ser: 0.75 mg/dL (ref 0.44–1.00)
GFR, Estimated: >60 mL/min (ref >60)
Glucose, Bld: 198 mg/dL — ABNORMAL HIGH (ref 70–99)
Potassium: 4.5 mmol/L (ref 3.5–5.1)
Sodium: 143 mmol/L (ref 135–145)

## 2022-10-27 LAB — RENAL FUNCTION PANEL
Albumin: 2 g/dL — ABNORMAL LOW (ref 3.5–5.0)
Anion gap: 12 (ref 5–15)
BUN: 36 mg/dL — ABNORMAL HIGH (ref 6–20)
CO2: 26 mmol/L (ref 22–32)
Calcium: 8.3 mg/dL — ABNORMAL LOW (ref 8.9–10.3)
Chloride: 104 mmol/L (ref 98–111)
Creatinine, Ser: 0.8 mg/dL (ref 0.44–1.00)
GFR, Estimated: >60 mL/min (ref >60)
Glucose, Bld: 188 mg/dL — ABNORMAL HIGH (ref 70–99)
Phosphorus: 4.1 mg/dL (ref 2.5–4.6)
Potassium: 3 mmol/L — ABNORMAL LOW (ref 3.5–5.1)
Sodium: 142 mmol/L (ref 135–145)

## 2022-10-27 LAB — CBC WITH DIFFERENTIAL/PLATELET
Abs Immature Granulocytes: 0.05 10*3/uL (ref 0.00–0.07)
Basophils Absolute: 0 10*3/uL (ref 0.0–0.1)
Basophils Relative: 0 %
Eosinophils Absolute: 0.2 10*3/uL (ref 0.0–0.5)
Eosinophils Relative: 2 %
HCT: 26.5 % — ABNORMAL LOW (ref 36.0–46.0)
Hemoglobin: 8.1 g/dL — ABNORMAL LOW (ref 12.0–15.0)
Immature Granulocytes: 1 %
Lymphocytes Relative: 7 %
Lymphs Abs: 0.6 10*3/uL — ABNORMAL LOW (ref 0.7–4.0)
MCH: 26 pg (ref 26.0–34.0)
MCHC: 30.6 g/dL (ref 30.0–36.0)
MCV: 85.2 fL (ref 80.0–100.0)
Monocytes Absolute: 0.7 10*3/uL (ref 0.1–1.0)
Monocytes Relative: 8 %
Neutro Abs: 6.9 10*3/uL (ref 1.7–7.7)
Neutrophils Relative %: 82 %
Platelets: 282 10*3/uL (ref 150–400)
RBC: 3.11 MIL/uL — ABNORMAL LOW (ref 3.87–5.11)
RDW: 16.6 % — ABNORMAL HIGH (ref 11.5–15.5)
WBC: 8.4 10*3/uL (ref 4.0–10.5)
nRBC: 0 % (ref 0.0–0.2)

## 2022-10-27 LAB — CULTURE, BLOOD (ROUTINE X 2): Special Requests: ADEQUATE

## 2022-10-27 LAB — GLUCOSE, CAPILLARY
Glucose-Capillary: 163 mg/dL — ABNORMAL HIGH (ref 70–99)
Glucose-Capillary: 187 mg/dL — ABNORMAL HIGH (ref 70–99)
Glucose-Capillary: 203 mg/dL — ABNORMAL HIGH (ref 70–99)
Glucose-Capillary: 194 mg/dL — ABNORMAL HIGH (ref 70–99)
Glucose-Capillary: 272 mg/dL — ABNORMAL HIGH (ref 70–99)
Glucose-Capillary: 253 mg/dL — ABNORMAL HIGH (ref 70–99)

## 2022-10-27 MED ORDER — MYCOPHENOLATE SODIUM 180 MG PO TBEC
720.0000 mg | DELAYED_RELEASE_TABLET | Freq: Two times a day (BID) | ORAL | Status: DC
  Administered 2022-10-27 – 2022-11-13 (×34): 720 mg via ORAL
  Filled 2022-10-27 (×34): qty 4

## 2022-10-27 MED ORDER — LABETALOL HCL 200 MG PO TABS
400.0000 mg | ORAL_TABLET | Freq: Three times a day (TID) | ORAL | Status: DC
  Administered 2022-10-27 – 2022-11-04 (×25): 400 mg via ORAL
  Filled 2022-10-27 (×26): qty 2

## 2022-10-27 MED ORDER — ADULT MULTIVITAMIN W/MINERALS CH
1.0000 | ORAL_TABLET | Freq: Every day | ORAL | Status: DC
  Administered 2022-10-28 – 2022-11-13 (×16): 1 via ORAL
  Filled 2022-10-27 (×14): qty 1

## 2022-10-27 MED ORDER — BUPROPION HCL 75 MG PO TABS
225.0000 mg | ORAL_TABLET | Freq: Two times a day (BID) | ORAL | Status: DC
  Administered 2022-10-27 – 2022-11-13 (×34): 225 mg via ORAL
  Filled 2022-10-27 (×35): qty 3

## 2022-10-27 MED ORDER — PREDNISONE 5 MG PO TABS
5.0000 mg | ORAL_TABLET | Freq: Every day | ORAL | Status: DC
  Administered 2022-10-28 – 2022-11-13 (×17): 5 mg via ORAL
  Filled 2022-10-27 (×17): qty 1

## 2022-10-27 MED ORDER — FOLIC ACID 1 MG PO TABS
1.0000 mg | ORAL_TABLET | Freq: Every day | ORAL | Status: DC
  Administered 2022-10-28 – 2022-11-13 (×17): 1 mg via ORAL
  Filled 2022-10-27 (×17): qty 1

## 2022-10-27 MED ORDER — ARIPIPRAZOLE 10 MG PO TABS
30.0000 mg | ORAL_TABLET | Freq: Every day | ORAL | Status: DC
  Administered 2022-10-28 – 2022-11-08 (×12): 30 mg via ORAL
  Filled 2022-10-27 (×13): qty 3

## 2022-10-27 MED ORDER — GUAIFENESIN 200 MG PO TABS
400.0000 mg | ORAL_TABLET | ORAL | Status: DC
  Administered 2022-10-27 – 2022-11-04 (×46): 400 mg via ORAL
  Filled 2022-10-27 (×48): qty 2

## 2022-10-27 MED ORDER — FUROSEMIDE 10 MG/ML IJ SOLN
60.0000 mg | Freq: Two times a day (BID) | INTRAMUSCULAR | Status: DC
  Administered 2022-10-27 – 2022-10-31 (×10): 60 mg via INTRAVENOUS
  Filled 2022-10-27 (×10): qty 6

## 2022-10-27 MED ORDER — HYDRALAZINE HCL 50 MG PO TABS
100.0000 mg | ORAL_TABLET | Freq: Three times a day (TID) | ORAL | Status: DC
  Administered 2022-10-27 – 2022-11-03 (×20): 100 mg via ORAL
  Filled 2022-10-27 (×20): qty 2

## 2022-10-27 MED ORDER — PRAZOSIN HCL 1 MG PO CAPS
1.0000 mg | ORAL_CAPSULE | Freq: Every day | ORAL | Status: DC
  Administered 2022-10-27 – 2022-11-07 (×12): 1 mg via ORAL
  Filled 2022-10-27 (×13): qty 1

## 2022-10-27 MED ORDER — HYDROXYZINE HCL 25 MG PO TABS
25.0000 mg | ORAL_TABLET | Freq: Every day | ORAL | Status: DC
  Administered 2022-10-27 – 2022-11-08 (×13): 25 mg via ORAL
  Filled 2022-10-27 (×15): qty 1

## 2022-10-27 MED ORDER — CLONIDINE HCL 0.3 MG PO TABS
0.3000 mg | ORAL_TABLET | Freq: Three times a day (TID) | ORAL | Status: DC
  Administered 2022-10-27 – 2022-11-03 (×21): 0.3 mg via ORAL
  Filled 2022-10-27: qty 2
  Filled 2022-10-27 (×2): qty 1
  Filled 2022-10-27 (×2): qty 2
  Filled 2022-10-27: qty 1
  Filled 2022-10-27 (×3): qty 2
  Filled 2022-10-27 (×10): qty 1
  Filled 2022-10-27 (×2): qty 2
  Filled 2022-10-27: qty 1

## 2022-10-27 MED ORDER — MONTELUKAST SODIUM 10 MG PO TABS
10.0000 mg | ORAL_TABLET | Freq: Every day | ORAL | Status: DC
  Administered 2022-10-27 – 2022-11-12 (×17): 10 mg via ORAL
  Filled 2022-10-27 (×17): qty 1

## 2022-10-27 MED ORDER — WHITE PETROLATUM EX OINT
TOPICAL_OINTMENT | CUTANEOUS | Status: DC | PRN
  Filled 2022-10-27: qty 28.35

## 2022-10-27 MED ORDER — SPIRONOLACTONE 25 MG PO TABS
50.0000 mg | ORAL_TABLET | Freq: Every day | ORAL | Status: DC
  Administered 2022-10-28 – 2022-11-13 (×17): 50 mg via ORAL
  Filled 2022-10-27 (×17): qty 2

## 2022-10-27 MED ORDER — ATORVASTATIN CALCIUM 10 MG PO TABS
10.0000 mg | ORAL_TABLET | Freq: Every day | ORAL | Status: DC
  Administered 2022-10-28 – 2022-11-13 (×17): 10 mg via ORAL
  Filled 2022-10-27 (×17): qty 1

## 2022-10-27 MED ORDER — TACROLIMUS 1 MG PO CAPS
4.0000 mg | ORAL_CAPSULE | Freq: Two times a day (BID) | ORAL | Status: DC
  Administered 2022-10-27 – 2022-10-30 (×6): 4 mg via ORAL
  Filled 2022-10-27 (×6): qty 4

## 2022-10-27 MED ORDER — SULFAMETHOXAZOLE-TRIMETHOPRIM 400-80 MG PO TABS
1.0000 | ORAL_TABLET | ORAL | Status: DC
  Administered 2022-10-29 – 2022-11-12 (×7): 1 via ORAL
  Filled 2022-10-27 (×7): qty 1

## 2022-10-27 MED ORDER — AMLODIPINE BESYLATE 10 MG PO TABS
10.0000 mg | ORAL_TABLET | Freq: Every day | ORAL | Status: DC
  Administered 2022-10-28 – 2022-11-03 (×7): 10 mg via ORAL
  Filled 2022-10-27 (×7): qty 1

## 2022-10-27 MED ORDER — POTASSIUM CHLORIDE 20 MEQ PO PACK
60.0000 meq | PACK | Freq: Four times a day (QID) | ORAL | Status: AC
  Administered 2022-10-27 (×3): 60 meq
  Filled 2022-10-27 (×3): qty 3

## 2022-10-27 NOTE — Progress Notes (Signed)
Nutrition Follow-up  DOCUMENTATION CODES:  Obesity unspecified  INTERVENTION:  Continue current diet as ordered. Will adjust TF rate as oral intake increases Tube feeding via post-pyloric cortrak: Vital AF 1.2 goal rate of 60 ml/h (1440 ml per day) At goal, provides 1728 kcal, 108 gm protein, 1168 ml free water daily  NUTRITION DIAGNOSIS:  Inadequate oral intake related to inability to eat as evidenced by NPO status. - Ongoing   GOAL:  Provide needs based on ASPEN/SCCM guidelines  - Ongoing   MONITOR:  TF tolerance, I & O's, Vent status, Labs  REASON FOR ASSESSMENT:  Rounds, Ventilator, Consult Enteral/tube feeding initiation and management  ASSESSMENT:  Pt with hx of prior ESRD on HD now with renal transplant 2018, DM type 2, HTN, HLD, CAD, gastroparesis, and GERD presented to ED with weakness and SOB. Imaging suggestive of pneumonia.  7/6 - admitted 7/9 - worsening SOB, transferred to ICU 7/10 - intubated, bronchoscopy; trickle TF started 7/12 - TF started advancing to goal 7/15 - TF held and OGT hooked to suction after TF noted in oral cavity, HD initiated 7/17 - pt with vomiting again overnight, OGT back to suction 7/18 - bronchoscopy, mucus plugging 7/19 - cortrak placed post-pyloric (proximal jejunum) 7/23 - extubated  Pt able to be extubated but required BiPAP overnight this AM. Currently back to HFNC. TF advancing to goal of 60, at 40 this AM.   MD asked TF to be held while on BiPAP, discussed tube placement and very low risk for complications with BiPAP, ok to continue feeding even if pt does need to resume BiPAP support at sometime Discussed with RN.   Pt able to undergo bedside swallow and have clear liquids initiated. If Diet advanced and pt taking in PO, will adjust TF with the goal of discontinuation when intake is at baseline.   Temp (24hrs), Avg:99 F (37.2 C), Min:97.8 F (36.6 C), Max:99.7 F (37.6 C)   Intake/Output Summary (Last 24 hours) at  10/27/2022 1347 Last data filed at 10/27/2022 1317 Gross per 24 hour  Intake 1321.21 ml  Output 6097 ml  Net -4775.79 ml  Net IO Since Admission: 6,322.09 mL [10/27/22 1347]  Nutritionally Relevant Medications: Scheduled Meds:  folic acid  1 mg Daily   furosemide  60 mg BID   insulin aspart  0-20 Units Q4H   insulin detemir  8 Units Daily   multivitamin with minerals  1 tablet Daily   mycophenolate  1,000 mg BID   mouth rinse  15 mL 4 times per day   potassium chloride  60 mEq Q6H   prazosin  1 mg QHS   predniSONE  5 mg Q breakfast   spironolactone  50 mg Daily   sulfamethoxazole-trimethoprim  1 tablet 1x/d Monday Wednesday Friday   tacrolimus  1.5 mg BID   Continuous Infusions:  clevidipine 6 mg/hr (10/27/22 1300)   feeding supplement (VITAL AF 1.2 CAL) 40 mL/hr at 10/27/22 1300   Labs Reviewed: K 3.0 BUN 67, creatinine 1.23 CBG ranges from 149-202 mg/dL over the last 24 hours HgbA1c 6.7% (5/24)  Diet Order:   Diet Order             Diet clear liquid Room service appropriate? Yes; Fluid consistency: Thin  Diet effective now                   EDUCATION NEEDS:  Not appropriate for education at this time  Skin:  Skin Assessment: Reviewed RN Assessment  Last  BM:  7/21 - type 6 FMS: 35mL out in the last 24 hours  Height:  Ht Readings from Last 1 Encounters:  10/21/22 5\' 6"  (1.676 m)   Weight:  Wt Readings from Last 1 Encounters:  10/27/22 117.2 kg   Ideal Body Weight:  59.1 kg  BMI:  Body mass index is 41.7 kg/m.  Estimated Nutritional Needs:  Kcal:  1800-2100kcal/d Protein:  100-120g/d Fluid:  1.8L/d   Greig Castilla, RD, LDN Clinical Dietitian RD pager # available in [facility]  After hours/weekend pager # available in [facility]

## 2022-10-27 NOTE — Evaluation (Signed)
Physical Therapy Evaluation Patient Details Name: Linda Ellison MRN: [id number] DOB: 1975/08/01 Today's Date: 10/27/2022  History of Present Illness  47 y.o. female presents to [facility] hospital on 10/09/2022 with CAP. Pt transferred to ICU on 7/9, intubated 7/10. HDU initiated on 7/15 due to fluid overload. Bronch 7/18. Extubated 10/26/2022. PMH includes renal transplant, DMII, HTN, HLD, OSA, CAD, hypothyroidism, gastroparesis, anxiety, depression.   Clinical Impression  Linda Ellison is 47 y.o. female admitted with above HPI and diagnosis. Patient is currently limited by functional impairments below (see PT problem list). Patient lives with sig other, daughter, and granddaughter and is independent with no AD caring for her 1 y.o. granddaughter at baseline. Pt was able to complete bed mobility required mod assist to roll and demonstrating good initiation to reach for bed rail and good sequencing for LE's to facilitate lower trunk roll. Pt ultimately required Max +2 assist for supine<>sit EOB and attempted sit<>stands today with 2HHA max assist. Pt able to raise hips partially off EOB but unable to achieve full stand. Patient will benefit from continued skilled PT interventions to address impairments and progress independence with mobility. Acute PT will follow and progress as able. Patient will benefit from intensive inpatient follow up therapy, >3 hours/day.       Assistance Recommended at Discharge Frequent or constant Supervision/Assistance  If plan is discharge home, recommend the following:  Can travel by private vehicle  Two people to help with walking and/or transfers;Two people to help with bathing/dressing/bathroom;Assistance with cooking/housework;Direct supervision/assist for medications management;Assist for transportation;Help with stairs or ramp for entrance        Equipment Recommendations Other (comment) (TBD)  Recommendations for Other Services  Rehab consult;OT consult     Functional Status Assessment Patient has had a recent decline in their functional status and demonstrates the ability to make significant improvements in function in a reasonable and predictable amount of time.     Precautions / Restrictions Precautions Precautions: Fall Precaution Comments: flexiseal, CVC, coretrak Restrictions Weight Bearing Restrictions: No      Mobility  Bed Mobility Overal bed mobility: Needs Assistance Bed Mobility: Supine to Sit, Rolling, Sit to Supine Rolling: Mod assist   Supine to sit: Max assist, +2 for safety/equipment, HOB elevated, +2 for physical assistance Sit to supine: Max assist, +2 for safety/equipment, +2 for physical assistance   General bed mobility comments: Mod assist to roll Rt/Lt with manual assist and cues to reach for bed rail. pt able to flex knee's to initiate lower trunk roll. therapist facilitated full turn.    Transfers Overall transfer level: Needs assistance Equipment used: 2 person hand held assist Transfers: Sit to/from Stand Sit to Stand: Max assist, +2 physical assistance, +2 safety/equipment           General transfer comment: max +2 for partial stand from EOB, pt completed 2x partial stand with Max +2 HHA and PT/RN blocking Bil LE's to prevent sliding and use of bed pad to lift hips off EOB.    Ambulation/Gait                  Stairs            Wheelchair Mobility     Tilt Bed    Modified Rankin (Stroke Patients Only)       Balance Overall balance assessment: Needs assistance Sitting-balance support: Feet supported, Bilateral upper extremity supported Sitting balance-Leahy Scale: Fair     Standing balance support: Bilateral upper extremity supported Standing balance-Leahy  Scale: Zero Standing balance comment: partial stand unable to get fully upright                             Pertinent Vitals/Pain Pain Assessment Faces Pain Scale: Hurts a little bit Pain Location:  generalized with mobility Pain Descriptors / Indicators: Discomfort Pain Intervention(s): Limited activity within patient's tolerance, Monitored during session, Repositioned    Home Living Family/patient expects to be discharged to:: Private residence Living Arrangements: Spouse/significant other;Children;Other relatives (daughter and granddaughter live with them, pt's daughter working during day) Available Help at Discharge: Family;Available 24 hours/day Type of Home: House Home Access: Stairs to enter Entrance Stairs-Rails: None Entrance Stairs-Number of Steps: 1   Home Layout: One level Home Equipment: None Additional Comments: pt keeps her 1 y.o. granddaughter during the day. pt's fiance is at home, does not work, so he can assist as needed.    Prior Function Prior Level of Function : Independent/Modified Independent;Driving             Mobility Comments: pt was mobilizing with no AD, driving, shopping, cooking, cleaning. ADLs Comments: pt fully independent with self care and taking care of 1 y.o. granddaughter     Hand Dominance   Dominant Hand: Right    Extremity/Trunk Assessment   Upper Extremity Assessment Upper Extremity Assessment: Defer to OT evaluation    Lower Extremity Assessment Lower Extremity Assessment: Generalized weakness;RLE deficits/detail;LLE deficits/detail RLE Deficits / Details: 3/5 for knee ext, 3-/5 for knee flex, 3/5 for DF, 2-/5 for hip flex RLE Coordination: decreased gross motor;decreased fine motor LLE Deficits / Details: 3/5 for knee ext, 3-/5 for knee flex, 3/5 for DF, 2-/5 for hip flex LLE Coordination: decreased gross motor;decreased fine motor    Cervical / Trunk Assessment Cervical / Trunk Assessment: Other exceptions Cervical / Trunk Exceptions: habitus  Communication   Communication: Other (comment) (soft spoken)  Cognition Arousal/Alertness: Awake/alert Behavior During Therapy: WFL for tasks assessed/performed Overall  Cognitive Status: Within Functional Limits for tasks assessed                                 General Comments:  (pt soft spoken, orientedx4 and able to answer questions appropriately and accurately per family.)        General Comments      Exercises     Assessment/Plan    PT Assessment Patient needs continued PT services  PT Problem List Decreased strength;Decreased range of motion;Decreased activity tolerance;Decreased balance;Decreased mobility;Decreased coordination;Decreased knowledge of use of DME;Decreased safety awareness;Decreased knowledge of precautions;Cardiopulmonary status limiting activity;Obesity       PT Treatment Interventions DME instruction;Gait training;Stair training;Functional mobility training;Therapeutic activities;Therapeutic exercise;Balance training;Neuromuscular re-education;Cognitive remediation;Patient/family education;Wheelchair mobility training    PT Goals (Current goals can be found in the Care Plan section)  Acute Rehab PT Goals Patient Stated Goal: regain independence, mobilize PT Goal Formulation: With patient Time For Goal Achievement: 11/10/22 Potential to Achieve Goals: Good    Frequency Min 1X/week     Co-evaluation               AM-PAC PT "6 Clicks" Mobility  Outcome Measure Help needed turning from your back to your side while in a flat bed without using bedrails?: A Lot Help needed moving from lying on your back to sitting on the side of a flat bed without using bedrails?: Total Help needed moving to and  from a bed to a chair (including a wheelchair)?: Total Help needed standing up from a chair using your arms (e.g., wheelchair or bedside chair)?: Total Help needed to walk in hospital room?: Total Help needed climbing 3-5 steps with a railing? : Total 6 Click Score: 7    End of Session Equipment Utilized During Treatment: Gait belt;Oxygen Activity Tolerance: Patient tolerated treatment well Patient left:  in bed;with call bell/phone within reach;with family/visitor present;with nursing/sitter in room Nurse Communication: Mobility status PT Visit Diagnosis: Other abnormalities of gait and mobility (R26.89);Muscle weakness (generalized) (M62.81);Difficulty in walking, not elsewhere classified (R26.2)    Time: 1610-9604 PT Time Calculation (min) (ACUTE ONLY): 44 min   Charges:   PT Evaluation $PT Eval Moderate Complexity: 1 Mod PT Treatments $Therapeutic Activity: 23-37 mins PT General Charges $$ ACUTE PT VISIT: 1 Visit         Wynn Maudlin, DPT Acute Rehabilitation Services Office [phone]  10/27/22 4:35 PM

## 2022-10-27 NOTE — Progress Notes (Signed)
NAME:  Linda Ellison, MRN:  [id number], DOB:  1976/03/22, LOS: 17 ADMISSION DATE:  10/09/2022, CONSULTATION DATE: 10/12/2022 REFERRING MD: [facility], CHIEF COMPLAINT: Respiratory distress  BRIEF  47 year old female with refractory hypertension, admitted with community-acquired pneumonia.  She is status post cadaveric renal transplant and she is followed by nephrology.  Admission creatinine was 2.2, course complicated by mechanical ventilation and AKI requiring transient dialysis  Pertinent  Medical History    has a past medical history of Allergy, Anemia associated with chronic renal failure, Anxiety, Asthma, Atypical chest pain, AV (arteriovenous fistula) (HCC), CAD (coronary artery disease) (cardiologist-- dr Sharol Roussel [facility] [place] [facility] in [place])), Depression, Elevated lipids, ESRD on hemodialysis (HCC) (NEPHROLOGIST-  DR MATTINGLY), Gastroparesis, GERD (gastroesophageal reflux disease), History of pneumonia, Hyperlipidemia, Hyperthyroidism, Menorrhagia, Other secondary hypertension, Peripheral neuropathy, PONV (postoperative nausea and vomiting), Pre-transplant evaluation for kidney transplant, Renal failure syndrome (03/05/2022), Secondary hyperparathyroidism of renal origin [facility]), Sleep apnea, and Type 2 diabetes mellitus, with long-term current use of insulin (HCC).   has a past surgical history that includes Cesarean section (03/22/2001); AV fistula placement (08/2010); Ligation goretex fistula (01/04/11); Esophagogastroduodenoscopy (10/19/2011); Foot surgery (Left, 2014 approx.); left heart catheterization with coronary angiogram (N/A, 06/27/2012); AV fistula placement (Right, 05/07/2014); AV fistula placement (Right, 07/02/2014); Thrombectomy and revision of arterioventous (av) goretex  graft (Right, 07/16/2014); Cardiac catheterization (Left, 08/08/2014); Cardiac catheterization (Left, 08/08/2014); Ligation of arteriovenous  fistula (Left, 08/21/2014); Cardiac catheterization (Left, 03/08/2016);  transthoracic echocardiogram (06/04/2016); Dobutamine stress echo (06/04/2016    [facility]); Cardiovascular stress test (06/25/2016   [facility]); Dilation and curettage of uterus (05/12/2000); Colonoscopy with esophagogastroduodenoscopy (egd) (10/19/2011); Laparoscopic cholecystectomy (1994); Femur IM nail (Left, child); Hysteroscopy with novasure (N/A, 08/19/2016); Colonoscopy; Polypectomy; Kidney transplant (09/18/2016); Ligation of arteriovenous  fistula (Left, 06/29/2017); and Nephrectomy transplanted organ.     Significant Hospital Events: Including procedures, antibiotic start and stop dates in addition to other pertinent events   10/09/2022 - ADMIT Respiratory culture no growth MRSA PCR negative Blood culture negative Respiratory virus panel: Negative 07/09 Transferred to ICU  07/10: Intubated  BAL: neg as of 7/20 7/13-improving O2 requirement 7/15 worsening renal function, fluid overloaded 7/15-tolerated hemodialysis  7/17-endotracheal tube was exchanged for mucous plugging Trach aspirate-20,000 colonies of Candida albicans [immunosuppressed patient] - 7/18-emergent bronchoscopy for mucous plugging  - Mucous plugging noted this morning, required bagging off the vent and bronchoscopy Was able to respond to name calling, was able to squeeze hands bilateral upper extremities, did not wiggle toes BAL -rare yeast 7/19: Febilr since 7/17 but wbc improved. On vent. On cleviprex due to severe BP along with opral meds -> cleviprex needs increaed. Per phmarcuy oon lot of PO BP medications. Fio2 30%. Culture negative so far.  On PRN fentanyl. SEdation gtt off due to attempts to fully wake her up/ 3.5 hours of dialysis. Mucous plugging noted again at around 4:30 PM despite Mucomyst.  -> ordere  increased to fET tube requent suctioning  10/23/2022: Respiratory reported spontaneous breathing trial attempt but patient continues to have mucous plugs and agitation with WUA ( though followed commands).   Currently on 50% FiO2.  On Cleviprex and Precedex.  Fever continues.  White count increased to 19.7 K. Diarrhea ++ - RN has stiopped lacutlose. No HD today per renal. Giving lasix gtt per renal Started 3% saline neb and scopolamine patch.  Off Mucomyst. 7/21 twice daily Lasix 120 7/22 weaned on PS, copious secretions 7/23 extubated, needed BiPAP  Interim History / Subjective:   Critically ill but  improving. Remained on BiPAP overnight Hypertensive, remains on Cleviprex drip. Afebrile Diuresed 6.4 L with Lasix   Objective   Blood pressure (!) 153/73, pulse 90, temperature 98.4 F (36.9 C), temperature source Oral, resp. rate (!) 23, height 5\' 6"  (1.676 m), weight 117.2 kg, SpO2 97%.      Vent Mode: BIPAP;PCV FiO2 (%):  [40 %] 40 % Set Rate:  [8 bmp] 8 bmp PEEP:  [8 cmH20] 8 cmH20   Intake/Output Summary (Last 24 hours) at 10/27/2022 1319 Last data filed at 10/27/2022 1317 Gross per 24 hour  Intake 1260.05 ml  Output 6097 ml  Net -4836.95 ml   Filed Weights   10/25/22 0500 10/26/22 0500 10/27/22 0458  Weight: 120.8 kg 122.9 kg 117.2 kg    Examination: General Appearance:   critically ill OBESE - + Head:  Normocephalic, without obvious abnormality, atraumatic Eyes:  PERRL - yes, conjunctiva/corneas - muddy     Ears:  Normal external ear canals, both ears Nose:  G tube - yes  Throat: Tubes out Neck:  Supple,  No enlargement/tenderness/nodules Lungs: Bilateral decreased breath sounds, no accessory muscle use Heart:  S1 and S2 normal, no murmur, CVP - no.  Pressors - no Abdomen:  Soft, no masses, no organomegaly Genitalia / Rectal:  Not done Extremities:  Extremities- intact Skin:  intact in exposed areas . Neurologic: Alert, interactive, anxious affect, nonfocal   Chest x-ray independently reviewed 7/24 shows stable bilateral interstitial and airspace opacities, decreased edema Labs show hypokalemia, decreased BUN/creatinine to 36/0.8, low albumin 2.0, no  leukocytosis  Resolved Hospital Problem list   ARDS At risk for QT prolongation -Precedex and Reglan DC'd New rash chest 10/24/22  Assessment & Plan:   Acute hypoxemic respiratory failure ARDS-resolved Mucus plugging needing bronch 10/21/22 and 10/22/22 (s/p mucomst x 2 days ending 7/20) Extubated 7/23, required BiPAP  Plan -Trial of BiPAP, okay to use as needed and during sleep -High flow nasal cannula daytime -Tracheobronchial toilet, incentive spirometry    Immunosuppressed status due to CellCept and chronic prednisone and tacrolimus; on Bactrim prophylaxis =- Prior to & Present on Admit Multilobar pneumonia idiopathic   Present on admit (empiric Rx)Completd course for Zosyn Fever curve and WBC trending down, repeat cultures negative  Plan -Holding off on Diflucan -Will plan to DC CVL soon   Acute  encephalopathy -improved  Plan -Off enteral oxycodone and Klonopin since 10/22/2022 -dc  precedex gtt    -Continue Abilify - Continue Wellbutrin  Severe Hypertension  7/21: On Cleviprex infusion since 10/17/2022.  On clonidine oral, scheduled along with prazosin scheduled.  Hydralazine as needed, labetalol as needed and Lopressor as needed/ .   Plan  - Taper  Cleviprex to off - change coreg to home dose labetalol - continue cloninidine oral - continue parazosin oral    History of renal transplant secondary to acute renal failure S/p cadaveric renal transplantation on immunosuppressants -Tacrolimus toxicity Acute kidney injury on chronic kidney disease stage IIIa:  Receiving dialysis for anasarca: With volume removal   Last dialysis was 10/22/2022.  On high dose lasix since 10/23/22 Plan - per renal, Lasix decreased to 60 twice daily  -spironolactone increased to 50 mg daily -Continue tacrolimus, continue CellCept - Continue daily prednisone 5mg  per day - -On Bactrim PJP prophylaxis -Monitor urine output -Replete hypokalemia   Type 1 diabetes  PLAN  -  SSI   Gastroparesis due to DM Ileus onset 10/18/22 and persistent 10/22/22 Diarrhea - s/p flexiseal on 10/19/22 - anti-diarrheals held  10/23/22  Plan   -Continue tube feeds while advancing oral - Reglan since 10/19/22    Anemia of chronic illness  Plan  - - PRBC for hgb </= 6.9gm%      Continue in ICU to see if she stays off BiPAP and improves with PT and advancing diet  best Practice (right click and "Reselect all SmartList Selections" daily)   Diet/type: tubefeeds DVT prophylaxis: prophylactic heparin  GI prophylaxis: PPI Lines: N/A Foley:  N/A Code Status:  full code Last date of multidisciplinary goals of care discussion [discussed with significant other at bedside ,   Fianc updated at bedside daily     ATTESTATION & SIGNATURE   The patient Linda Ellison is critically ill with multiple organ systems failure and requires high complexity decision making for assessment and support, frequent evaluation and titration of therapies, application of advanced monitoring technologies and extensive interpretation of multiple databases and discussion with other appropriate health care personnel such as bedside nurses, social workers, case Production designer, theatre/television/film, consultants, respiratory therapists, nutritionists, secretaries etc.,  Critical care time includes but is not restricted to just documentation time. Documentation can happen in parallel or sequential to care time depending on case mix urgency and priorities for the shift.   My independent critical care time was 31 minutes   Cyril Mourning MD. FCCP. [facility] Pager : [phone]  If no response to pager , please call [phone] until 7 pm After 7:00 pm call Elink  [phone]      10/27/2022 1:19 PM      LABS    PULMONARY Recent Labs  Lab 10/23/22 0602  PHART 7.477*  PCO2ART 34.5  PO2ART 67*  HCO3 25.5  TCO2 27  O2SAT 94    CBC Recent Labs  Lab 10/24/22 0403 10/25/22 0405 10/27/22 0512   HGB 8.4* 8.2* 8.1*  HCT 27.1* 27.0* 26.5*  WBC 12.2* 9.6 8.4  PLT 275 288 282    COAGULATION No results for input(s): "INR" in the last 168 hours.  CARDIAC  No results for input(s): "TROPONINI" in the last 168 hours. No results for input(s): "PROBNP" in the last 168 hours.   CHEMISTRY Recent Labs  Lab 10/22/22 1500 10/23/22 0354 10/23/22 0602 10/24/22 0403 10/25/22 0405 10/26/22 0750 10/26/22 1550 10/27/22 0512  NA 134* 134*   < > 134* 135 138 141 142  K 3.2* 3.1*   < > 3.6 3.0* 3.3* 3.5 3.0*  CL 95* 97*   < > 94* 101 106 106 104  CO2 23 25   < > 21* 23 23 24 26   GLUCOSE 184* 122*   < > 210* 183* 199* 180* 188*  BUN 117* 66*   < > 72* 67* 48* 44* 36*  CREATININE 2.70* 1.88*   < > 1.62* 1.23* 0.90 0.87 0.80  CALCIUM 8.4* 8.4*   < > 8.4* 8.2* 8.1* 8.3* 8.3*  MG  --  2.4  --  2.3 2.1  --   --   --   PHOS 7.5*  --   --  4.6 4.0 3.4  --  4.1   < > = values in this interval not displayed.   Estimated Creatinine Clearance: 114.4 mL/min (by C-G formula based on SCr of 0.8 mg/dL).   LIVER Recent Labs  Lab 10/23/22 0354 10/24/22 0403 10/25/22 0405 10/26/22 0750 10/27/22 0512  AST 50* 43* 41  --   --   ALT 44 48* 51*  --   --  ALKPHOS 95 132* 157*  --   --   BILITOT 0.6 1.0 0.8  --   --   PROT 6.4* 6.3* 6.0*  --   --   ALBUMIN 2.4* 2.3* 2.1* 2.1* 2.0*     INFECTIOUS Recent Labs  Lab 10/24/22 0403  LATICACIDVEN 0.9     ENDOCRINE CBG (last 3)  Recent Labs    10/27/22 0327 10/27/22 0733 10/27/22 1112  GLUCAP 163* 187* 203*

## 2022-10-27 NOTE — Progress Notes (Signed)
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  Subjective:  UOP robust with 6.5L made Scr is at baseline Extubated  07/23 0701 - 07/24 0700 In: 1414 [I.V.:915.2; NG/GT:374.7; IV Piggyback:124.1] Out: 6485 [Urine:6450; Stool:35]  Filed Weights   10/25/22 0500 10/26/22 0500 10/27/22 0458  Weight: 120.8 kg 122.9 kg 117.2 kg    Scheduled Meds:  amLODipine  10 mg Per Tube Daily   arformoterol  15 mcg Nebulization BID   ARIPiprazole  30 mg Per Tube Daily   atorvastatin  10 mg Per Tube Daily   buPROPion  225 mg Per Tube BID   carvedilol  12.5 mg Per Tube Q12H   cloNIDine  0.3 mg Per Tube TID   folic acid  1 mg Per Tube Daily   furosemide  60 mg Intravenous BID   guaiFENesin  400 mg Per Tube Q4H   heparin  5,000 Units Subcutaneous Q8H   hydrALAZINE  100 mg Per Tube Q8H   hydrOXYzine  25 mg Per Tube QHS   insulin aspart  0-20 Units Subcutaneous Q4H   insulin detemir  8 Units Subcutaneous Daily   montelukast  10 mg Per Tube QHS   multivitamin with minerals  1 tablet Per Tube Daily   mycophenolate  1,000 mg Per Tube BID   mouth rinse  15 mL Mouth Rinse 4 times per day   potassium chloride  60 mEq Per Tube Q6H   prazosin  1 mg Per Tube QHS   predniSONE  5 mg Per Tube Q breakfast   revefenacin  175 mcg Nebulization Daily   scopolamine  1 patch Transdermal Q72H   spironolactone  50 mg Per Tube Daily   sulfamethoxazole-trimethoprim  1 tablet Per Tube Once per day on Monday Wednesday Friday   tacrolimus  1.5 mg Sublingual BID   Continuous Infusions:  sodium chloride Stopped (10/26/22 0530)   anticoagulant sodium citrate     clevidipine 8 mg/hr (10/27/22 0800)   dexmedetomidine (PRECEDEX) IV infusion 0.4 mcg/kg/hr (10/27/22 0800)   feeding supplement (VITAL AF 1.2 CAL) Stopped (10/27/22 0757)   PRN Meds:.sodium chloride, acetaminophen (TYLENOL) oral liquid 160 mg/5 mL, albuterol, alteplase, anticoagulant sodium citrate, guaiFENesin, heparin,  heparin, hydrALAZINE, labetalol, midazolam, mouth rinse, oxyCODONE  Current Labs: reviewed today  Physical Exam:  Blood pressure (!) 158/69, pulse 95, temperature 99.1 F (37.3 C), temperature source Axillary, resp. rate (!) 23, height 5\' 6"  (1.676 m), weight 117.2 kg, SpO2 97%. GEN: lying in bed, nad ENT: no nasal discharge, mmm EYES: no scleral icterus, eomi CV: normal rate, no rub PULM: bilateral chest rise, no iwob ABD: NABS, non-distended SKIN: no rashes or jaundice EXT: diffuse edema, warm and well perfused   A AKI on CKD3a in setting of ddKT 2018 (BL SCr 1.4-1.6) baseline IS Tac/MMF/pred Started HD 7/15 for refractory hypervolemia and hyperkalemia, last Tx 7/19 Okay to remove HD cath Continue holding HD Azotemia, as above; improved with HD, now crt at bl Hyperkalemia,resolved with HD Hypokalemia AHRF / Acute asthma exacerbation PCP excluded, off high doseTMP/SMX; ABX per CCM Inbutaed 7/10, prolonged  IS, using Tac SL 1.5mg  BID (inc 7/19 with low 12h trhough), on cellcept now (go back to myfortic when tol PO); higher dose steriod from pulm indications; back on three times per week TMP/SMX HTN: on  clevipres, meds uptitrated by CCM, started spiro, diuresis Severe hypervolemia / anasarca as above  P Decrease lasix to 60mg  BID; titrate PRN Continue spironolactone to 50mg   daily Replace K prn Given normal Crt we will sign off at this time. Continue with diuresis; BP likely will improve once volume optimized. Feel free to reach out with any concerns. Medication Issues; Preferred narcotic agents for pain control are hydromorphone, fentanyl, and methadone. Morphine should not be used.  Baclofen should be avoided Avoid oral sodium phosphate and magnesium citrate based laxatives / bowel preps    Darnell Level  10/27/2022, 9:13 AM  Recent Labs  Lab 10/25/22 0405 10/26/22 0750 10/26/22 1550 10/27/22 0512  NA 135 138 141 142  K 3.0* 3.3* 3.5 3.0*  CL 101 106 106 104   CO2 23 23 24 26   GLUCOSE 183* 199* 180* 188*  BUN 67* 48* 44* 36*  CREATININE 1.23* 0.90 0.87 0.80  CALCIUM 8.2* 8.1* 8.3* 8.3*  PHOS 4.0 3.4  --  4.1   Recent Labs  Lab 10/22/22 1500 10/23/22 0602 10/24/22 0403 10/25/22 0405 10/27/22 0512  WBC 19.7*  --  12.2* 9.6 8.4  NEUTROABS 17.6*  --   --   --  6.9  HGB 8.4*   < > 8.4* 8.2* 8.1*  HCT 26.8*   < > 27.1* 27.0* 26.5*  MCV 81.7  --  84.2 82.6 85.2  PLT 321  --  275 288 282   < > = values in this interval not displayed.

## 2022-10-28 DIAGNOSIS — J9601 Acute respiratory failure with hypoxia: Secondary | ICD-10-CM | POA: Diagnosis not present

## 2022-10-28 DIAGNOSIS — J189 Pneumonia, unspecified organism: Secondary | ICD-10-CM | POA: Diagnosis not present

## 2022-10-28 DIAGNOSIS — N179 Acute kidney failure, unspecified: Secondary | ICD-10-CM | POA: Diagnosis not present

## 2022-10-28 DIAGNOSIS — I10 Essential (primary) hypertension: Secondary | ICD-10-CM | POA: Diagnosis not present

## 2022-10-28 LAB — CBC WITH DIFFERENTIAL/PLATELET
Abs Immature Granulocytes: 0.03 10*3/uL (ref 0.00–0.07)
Basophils Absolute: 0 10*3/uL (ref 0.0–0.1)
Basophils Relative: 0 %
Eosinophils Absolute: 0.1 10*3/uL (ref 0.0–0.5)
Eosinophils Relative: 2 %
HCT: 27.1 % — ABNORMAL LOW (ref 36.0–46.0)
Hemoglobin: 8.1 g/dL — ABNORMAL LOW (ref 12.0–15.0)
Immature Granulocytes: 0 %
Lymphocytes Relative: 9 %
Lymphs Abs: 0.6 10*3/uL — ABNORMAL LOW (ref 0.7–4.0)
MCH: 25.2 pg — ABNORMAL LOW (ref 26.0–34.0)
MCHC: 29.9 g/dL — ABNORMAL LOW (ref 30.0–36.0)
MCV: 84.4 fL (ref 80.0–100.0)
Monocytes Absolute: 0.7 10*3/uL (ref 0.1–1.0)
Monocytes Relative: 10 %
Neutro Abs: 5.7 10*3/uL (ref 1.7–7.7)
Neutrophils Relative %: 79 %
Platelets: 283 10*3/uL (ref 150–400)
RBC: 3.21 MIL/uL — ABNORMAL LOW (ref 3.87–5.11)
RDW: 16.8 % — ABNORMAL HIGH (ref 11.5–15.5)
nRBC: 0 % (ref 0.0–0.2)

## 2022-10-28 LAB — RENAL FUNCTION PANEL
Albumin: 2.1 g/dL — ABNORMAL LOW (ref 3.5–5.0)
Anion gap: 11 (ref 5–15)
BUN: 28 mg/dL — ABNORMAL HIGH (ref 6–20)
Calcium: 8.2 mg/dL — ABNORMAL LOW (ref 8.9–10.3)
Chloride: 105 mmol/L (ref 98–111)
Creatinine, Ser: 0.78 mg/dL (ref 0.44–1.00)
GFR, Estimated: >60 mL/min (ref >60)
Glucose, Bld: 280 mg/dL — ABNORMAL HIGH (ref 70–99)
Phosphorus: 2.9 mg/dL (ref 2.5–4.6)
Potassium: 4.3 mmol/L (ref 3.5–5.1)
Sodium: 140 mmol/L (ref 135–145)

## 2022-10-28 LAB — CULTURE, BLOOD (ROUTINE X 2)
Culture: NO GROWTH
Culture: NO GROWTH
Special Requests: ADEQUATE

## 2022-10-28 LAB — GLUCOSE, CAPILLARY
Glucose-Capillary: 251 mg/dL — ABNORMAL HIGH (ref 70–99)
Glucose-Capillary: 240 mg/dL — ABNORMAL HIGH (ref 70–99)
Glucose-Capillary: 290 mg/dL — ABNORMAL HIGH (ref 70–99)
Glucose-Capillary: 333 mg/dL — ABNORMAL HIGH (ref 70–99)
Glucose-Capillary: 255 mg/dL — ABNORMAL HIGH (ref 70–99)
Glucose-Capillary: 169 mg/dL — ABNORMAL HIGH (ref 70–99)

## 2022-10-28 LAB — MAGNESIUM: Magnesium: 1.3 mg/dL — ABNORMAL LOW (ref 1.7–2.4)

## 2022-10-28 MED ORDER — TRAZODONE HCL 50 MG PO TABS
100.0000 mg | ORAL_TABLET | Freq: Every evening | ORAL | Status: DC | PRN
  Administered 2022-10-28 – 2022-11-11 (×5): 100 mg via ORAL
  Filled 2022-10-28 (×5): qty 2

## 2022-10-28 MED ORDER — LOSARTAN POTASSIUM 50 MG PO TABS
50.0000 mg | ORAL_TABLET | Freq: Every day | ORAL | Status: DC
  Administered 2022-10-28: 50 mg via ORAL
  Filled 2022-10-28 (×2): qty 1

## 2022-10-28 MED ORDER — INSULIN DETEMIR 100 UNIT/ML ~~LOC~~ SOLN
15.0000 [IU] | Freq: Every day | SUBCUTANEOUS | Status: DC
  Administered 2022-10-28: 15 [IU] via SUBCUTANEOUS
  Filled 2022-10-28 (×2): qty 0.15

## 2022-10-28 MED ORDER — SODIUM CHLORIDE 3 % IN NEBU
4.0000 mL | INHALATION_SOLUTION | Freq: Two times a day (BID) | RESPIRATORY_TRACT | Status: AC
  Administered 2022-10-28 – 2022-10-30 (×6): 4 mL via RESPIRATORY_TRACT
  Filled 2022-10-28 (×6): qty 4

## 2022-10-28 MED ORDER — BANATROL TF EN LIQD
60.0000 mL | Freq: Two times a day (BID) | ENTERAL | Status: DC
  Administered 2022-10-28 – 2022-10-30 (×5): 60 mL
  Filled 2022-10-28 (×5): qty 60

## 2022-10-28 MED ORDER — INSULIN DETEMIR 100 UNIT/ML ~~LOC~~ SOLN: 12.0000 [IU] | Freq: Every day | SUBCUTANEOUS | Status: DC

## 2022-10-28 MED ORDER — MAGNESIUM SULFATE 4 GM/100ML IV SOLN
4.0000 g | Freq: Once | INTRAVENOUS | Status: AC
  Administered 2022-10-28: 4 g via INTRAVENOUS
  Filled 2022-10-28: qty 100

## 2022-10-28 NOTE — Progress Notes (Signed)
Physical Therapy Treatment Patient Details Name: Linda Ellison MRN: [id number] DOB: 04-04-1976 Today's Date: 10/28/2022   History of Present Illness 47 y.o. female presents to [facility] hospital on 10/09/2022 with CAP. Pt transferred to ICU on 7/9, intubated 7/10. HDU initiated on 7/15 due to fluid overload. Bronch 7/18. Extubated 10/26/2022. PMH includes renal transplant, DMII, HTN, HLD, OSA, CAD, hypothyroidism, gastroparesis, anxiety, depression.    PT Comments  Pt greeted resting in bed, eager for OOB mobility, and with continued progress towards acute goals. Session focused on initial transfer to chair with PT/OT assist. Pt able to perform bed mobility and transfers to stand frame with max A +2 and stedy frame support. Pt needing cues throughout session for breathing techniques as pt with noted increased in RR with standing trials, pt receptive and able to slow breathing with cues. Pt agreeable to time up in chair at end of session. Pt continues to be limited by weakness, decreased activity tolerance and cardiopulmonary endurance, and poor balance/postural reactions. Current plan remains appropriate to address deficits and maximize functional independence and decrease caregiver burden. Pt continues to benefit from skilled PT services to progress toward functional mobility goals.      Assistance Recommended at Discharge Frequent or constant Supervision/Assistance  If plan is discharge home, recommend the following:  Can travel by private vehicle    Two people to help with walking and/or transfers;Two people to help with bathing/dressing/bathroom;Assistance with cooking/housework;Direct supervision/assist for medications management;Assist for transportation;Help with stairs or ramp for entrance      Equipment Recommendations  Other (comment) (TBD)    Recommendations for Other Services       Precautions / Restrictions Precautions Precautions: Fall Precaution Comments: flexiseal, CVC,  coretrak Restrictions Weight Bearing Restrictions: No     Mobility  Bed Mobility Overal bed mobility: Needs Assistance Bed Mobility: Supine to Sit     Supine to sit: Max assist     General bed mobility comments: Assist to bring BLE toward EOB with multimodal cueing. light assist to elevate trunk. Heavier assist to scoot toward edge of bed    Transfers Overall transfer level: Needs assistance Equipment used: Ambulation equipment used Transfers: Sit to/from Stand Sit to Stand: Max assist, +2 physical assistance, +2 safety/equipment, Total assist           General transfer comment: Assist to bring BUE up to stedy bar and pt able to stand with max A +2 on 2 attempts and total A +2 on final attempt prior to sitting into chair. pt with good tolerance, but fatigues quickly.    Ambulation/Gait                   Stairs             Wheelchair Mobility     Tilt Bed    Modified Rankin (Stroke Patients Only)       Balance Overall balance assessment: Needs assistance Sitting-balance support: Feet supported, Bilateral upper extremity supported Sitting balance-Leahy Scale: Poor Sitting balance - Comments: Min A progressing to min guard A at EOB.   Standing balance support: Bilateral upper extremity supported Standing balance-Leahy Scale: Zero Standing balance comment: heavy assist to maintain upright in standing due to weakness.                            Cognition Arousal/Alertness: Awake/alert Behavior During Therapy: WFL for tasks assessed/performed Overall Cognitive Status: Within Functional Limits for tasks assessed  General Comments: Pt very soft spoken, but following all commands; intermittently needing increased time for initiation, but believe due to weakness. Pt able to answer all questions regarding PLOF, and mother in law in room reports cognition seems normal.        Exercises General  Exercises - Lower Extremity Long Arc Quad: AROM, Right, Left, 10 reps, Seated (with isometric hold at end range) Hip Flexion/Marching: AROM, Right, Left, 10 reps, Seated    General Comments General comments (skin integrity, edema, etc.): Pt on 4 L HFNC on arrival with HR of 94, SpO2 96%, RR of 27, and BP of 152/71 (94). EOB pt with HR of 98 and BP of 154/75 (98); After initial stand x2, HR 111 and BP 90/74 (80); sitting in chair HR of 96 and BP of 147/70 (91). Pt RR max of 32 this session and with OT/PT istructing in breathing techniques, able to kep breathing below 30 respirations and down to 20-22 at end of session.      Pertinent Vitals/Pain Pain Assessment Pain Assessment: Faces Faces Pain Scale: Hurts a little bit Pain Location: generalized with mobility Pain Descriptors / Indicators: Discomfort Pain Intervention(s): Limited activity within patient's tolerance, Monitored during session, Repositioned    Home Living Family/patient expects to be discharged to:: Private residence Living Arrangements: Spouse/significant other;Children;Other relatives (daughter and granddaughter live with them, pt's daughter working during day) Available Help at Discharge: Family;Available 24 hours/day Type of Home: House Home Access: Stairs to enter Entrance Stairs-Rails: None Entrance Stairs-Number of Steps: 1   Home Layout: One level Home Equipment: None Additional Comments: pt keeps her 1 y.o. granddaughter during the day. pt's fiance is at home, does not work, so he can assist as needed.    Prior Function            PT Goals (current goals can now be found in the care plan section) Acute Rehab PT Goals PT Goal Formulation: With patient Time For Goal Achievement: 11/10/22 Progress towards PT goals: Progressing toward goals    Frequency    Min 1X/week      PT Plan Current plan remains appropriate    Co-evaluation PT/OT/SLP Co-Evaluation/Treatment: Yes Reason for Co-Treatment:  Complexity of the patient's impairments (multi-system involvement);For patient/therapist safety;To address functional/ADL transfers PT goals addressed during session: Mobility/safety with mobility OT goals addressed during session: Proper use of Adaptive equipment and DME      AM-PAC PT "6 Clicks" Mobility   Outcome Measure  Help needed turning from your back to your side while in a flat bed without using bedrails?: A Lot Help needed moving from lying on your back to sitting on the side of a flat bed without using bedrails?: Total Help needed moving to and from a bed to a chair (including a wheelchair)?: Total Help needed standing up from a chair using your arms (e.g., wheelchair or bedside chair)?: Total Help needed to walk in hospital room?: Total Help needed climbing 3-5 steps with a railing? : Total 6 Click Score: 7    End of Session Equipment Utilized During Treatment: Oxygen Activity Tolerance: Patient tolerated treatment well Patient left: in chair;with call bell/phone within reach;with nursing/sitter in room;with family/visitor present Nurse Communication: Mobility status PT Visit Diagnosis: Other abnormalities of gait and mobility (R26.89);Muscle weakness (generalized) (M62.81);Difficulty in walking, not elsewhere classified (R26.2)     Time: 6295-2841 PT Time Calculation (min) (ACUTE ONLY): 23 min  Charges:    $Therapeutic Activity: 8-22 mins PT General Charges $$ ACUTE PT VISIT:  1 Visit                     Lenora Boys. PTA Acute Rehabilitation Services Office: [phone]   Catalina Antigua 10/28/2022, 1:56 PM

## 2022-10-28 NOTE — Evaluation (Addendum)
Occupational Therapy Evaluation Patient Details Name: Linda Ellison MRN: [id number] DOB: January 22, 1976 Today's Date: 10/28/2022   History of Present Illness 47 y.o. female presents to [facility] hospital on 10/09/2022 with CAP. Pt transferred to ICU on 7/9, intubated 7/10. HDU initiated on 7/15 due to fluid overload. Bronch 7/18. Extubated 10/26/2022. PMH includes renal transplant, DMII, HTN, HLD, OSA, CAD, hypothyroidism, gastroparesis, anxiety, depression.   Clinical Impression   PTA, pt performing ADL and IADL independently and lived with her family. Upon eval, pt eager to get OOB. Pt performing STS transfers with max A +2 and use of Stedy. Pt with increased RR in standing, but able to be directed in therapeutic breathing strategies. BP also soft as compared to supine (see general comments). Currently requiring min-total A for ADL. Limited by decreased strength, balance, activity tolerance, oxygen dependence, and DOE. Pt with good support, motivation, and significant change in functional status, thus recommending intensive multidisciplinary rehabilitation >3 hours/day to optimize safety and independence in ADL.       Recommendations for follow up therapy are one component of a multi-disciplinary discharge planning process, led by the attending physician.  Recommendations may be updated based on patient status, additional functional criteria and insurance authorization.   Assistance Recommended at Discharge Frequent or constant Supervision/Assistance  Patient can return home with the following Two people to help with walking and/or transfers;Two people to help with bathing/dressing/bathroom;Assistance with cooking/housework;Help with stairs or ramp for entrance;Assist for transportation;Assistance with feeding    Functional Status Assessment  Patient has had a recent decline in their functional status and demonstrates the ability to make significant improvements in function in a reasonable and predictable  amount of time.  Equipment Recommendations  Other (comment) (defer)    Recommendations for Other Services Rehab consult     Precautions / Restrictions Precautions Precautions: Fall Precaution Comments: flexiseal, CVC, coretrak Restrictions Weight Bearing Restrictions: No      Mobility Bed Mobility Overal bed mobility: Needs Assistance Bed Mobility: Supine to Sit     Supine to sit: Max assist     General bed mobility comments: Assist to bring BLE toward EOB with multimodal cueing. light assist to elevate trunk. Heavier assist to scoot toward edge of bed    Transfers Overall transfer level: Needs assistance Equipment used: Ambulation equipment used Transfers: Sit to/from Stand Sit to Stand: Max assist, +2 physical assistance, +2 safety/equipment, Total assist           General transfer comment: Assist to bring BUE up to stedy bar and pt able to stand with max A +2 on 2 attempts and total A +2 on final attempt prior to sitting into chair. pt with good tolerance, but fatigues quickly.      Balance Overall balance assessment: Needs assistance Sitting-balance support: Feet supported, Bilateral upper extremity supported Sitting balance-Leahy Scale: Poor Sitting balance - Comments: Min A progressing to min guard A at EOB.   Standing balance support: Bilateral upper extremity supported Standing balance-Leahy Scale: Zero Standing balance comment: heavy assist to maintain upright in standing due to weakness.                           ADL either performed or assessed with clinical judgement   ADL Overall ADL's : Needs assistance/impaired     Grooming: Minimal assistance;Sitting Grooming Details (indicate cue type and reason): to bring hand to face Upper Body Bathing: Sitting;Moderate assistance   Lower Body Bathing: Total assistance;+2 for  physical assistance;+2 for safety/equipment   Upper Body Dressing : Moderate assistance;Sitting   Lower Body  Dressing: Total assistance;+2 for physical assistance;+2 for safety/equipment   Toilet Transfer: Maximal assistance;+2 for physical assistance;+2 for safety/equipment Toilet Transfer Details (indicate cue type and reason): stedy         Functional mobility during ADLs: +2 for physical assistance;+2 for safety/equipment;Maximal assistance;Total assistance       Vision Baseline Vision/History: 0 No visual deficits Ability to See in Adequate Light: 0 Adequate Patient Visual Report: No change from baseline Additional Comments: pt denies visual changes.     Perception     Praxis      Pertinent Vitals/Pain Pain Assessment Pain Assessment: Faces Faces Pain Scale: Hurts a little bit Pain Location: generalized with mobility Pain Descriptors / Indicators: Discomfort Pain Intervention(s): Limited activity within patient's tolerance, Monitored during session     Hand Dominance Right   Extremity/Trunk Assessment Upper Extremity Assessment Upper Extremity Assessment: Generalized weakness;RUE deficits/detail;LUE deficits/detail RUE Deficits / Details: 3-/5 grossly, 3/5 FM LUE Deficits / Details: 3-/5 grossly, 3/5 FM   Lower Extremity Assessment Lower Extremity Assessment: Defer to PT evaluation   Cervical / Trunk Assessment Cervical / Trunk Assessment: Other exceptions Cervical / Trunk Exceptions: habitus   Communication Communication Communication: Other (comment) (very soft spoken)   Cognition Arousal/Alertness: Awake/alert Behavior During Therapy: WFL for tasks assessed/performed Overall Cognitive Status: Within Functional Limits for tasks assessed                                 General Comments: Pt very soft spoken, but following all commands; intermittently needing increased time for initiation, but believe due to weakness. Pt able to answer all questions regarding PLOF, and mother in law in room reports cognition seems normal.     General Comments  Pt on  4 L HFNC on arrival with HR of 94, SpO2 96%, RR of 27, and BP of 152/71 (94). EOB pt with HR of 98 and BP of 154/75 (98); After initial stand x2, HR 111 and BP 90/74 (80); sitting in chair HR of 96 and BP of 147/70 (91). Pt RR max of 32 this session and with OT/PT istructing in breathing techniques, able to kep breathing below 30 respirations and down to 20-22 at end of session.    Exercises     Shoulder Instructions      Home Living Family/patient expects to be discharged to:: Private residence Living Arrangements: Spouse/significant other;Children;Other relatives (daughter and granddaughter live with them, pt's daughter working during day) Available Help at Discharge: Family;Available 24 hours/day Type of Home: House Home Access: Stairs to enter [employer] of Steps: 1 Entrance Stairs-Rails: None Home Layout: One level     Bathroom Shower/Tub: Chief Strategy Officer: Standard     Home Equipment: None   Additional Comments: pt keeps her 1 y.o. granddaughter during the day. pt's fiance is at home, does not work, so he can assist as needed.      Prior Functioning/Environment Prior Level of Function : Independent/Modified Independent;Driving             Mobility Comments: pt was mobilizing with no AD, driving, shopping, cooking, cleaning. ADLs Comments: pt fully independent with self care and taking care of 1 y.o. granddaughter        OT Problem List: Decreased strength;Decreased activity tolerance;Impaired balance (sitting and/or standing);Decreased coordination;Decreased cognition;Decreased knowledge of use of DME  or AE;Impaired UE functional use;Cardiopulmonary status limiting activity      OT Treatment/Interventions: Self-care/ADL training;Therapeutic exercise;DME and/or AE instruction;Balance training;Patient/family education;Cognitive remediation/compensation;Therapeutic activities;Energy conservation    OT Goals(Current goals can be found in  the care plan section) Acute Rehab OT Goals Patient Stated Goal: get OOB OT Goal Formulation: With patient Time For Goal Achievement: 11/11/22 Potential to Achieve Goals: Good  OT Frequency: Min 2X/week    Co-evaluation PT/OT/SLP Co-Evaluation/Treatment: Yes Reason for Co-Treatment: Complexity of the patient's impairments (multi-system involvement);For patient/therapist safety;To address functional/ADL transfers PT goals addressed during session: Mobility/safety with mobility OT goals addressed during session: Proper use of Adaptive equipment and DME      AM-PAC OT "6 Clicks" Daily Activity     Outcome Measure Help from another person eating meals?: A Lot Help from another person taking care of personal grooming?: A Lot Help from another person toileting, which includes using toliet, bedpan, or urinal?: Total Help from another person bathing (including washing, rinsing, drying)?: A Lot Help from another person to put on and taking off regular upper body clothing?: A Lot Help from another person to put on and taking off regular lower body clothing?: Total 6 Click Score: 10   End of Session Equipment Utilized During Treatment: Other (comment);Oxygen (Stedy; 4L HFNC) Nurse Communication: Mobility status;Other (comment) (RN present in session; chair alarm under pt to be plugged in if family member leaves, and lift pad under pt for lift back to bed as well)  Activity Tolerance: Patient tolerated treatment well Patient left: in chair;with call bell/phone within reach;with nursing/sitter in room;with family/visitor present  OT Visit Diagnosis: Unsteadiness on feet (R26.81);Muscle weakness (generalized) (M62.81)                Time: 1021-1050 OT Time Calculation (min): 29 min Charges:  OT General Charges $OT Visit: 1 Visit OT Evaluation $OT Eval Moderate Complexity: 1 Mod  Tyler Deis, OTR/L [facility] Acute Rehabilitation Office: [phone]   Myrla Halsted 10/28/2022, 1:27  PM

## 2022-10-28 NOTE — Progress Notes (Signed)
Brief Nutrition Note  Pt with significant diarrhea per RN report. Added banatrol BID to aid in loose stool management. Will attempt to manage with fiber prior to adjusting formula as blood sugars and needs are high.   Greig Castilla, RD, LDN Clinical Dietitian RD pager # available in [facility]  After hours/weekend pager # available in [facility]

## 2022-10-28 NOTE — Progress Notes (Signed)
NAME:  Linda Ellison, MRN:  [id number], DOB:  Mar 07, 1976, LOS: 18 ADMISSION DATE:  10/09/2022, CONSULTATION DATE: 10/12/2022 REFERRING MD: Triad, CHIEF COMPLAINT: Respiratory distress  BRIEF  47 year old female with refractory hypertension, admitted with community-acquired pneumonia.  She is status post cadaveric renal transplant and she is followed by nephrology.  Admission creatinine was 2.2, course complicated by mechanical ventilation and AKI requiring transient dialysis  Pertinent  Medical History    has a past medical history of Allergy, Anemia associated with chronic renal failure, Anxiety, Asthma, Atypical chest pain, AV (arteriovenous fistula) (HCC), CAD (coronary artery disease) (cardiologist-- dr Sharol Roussel [facility] in [place])), Depression, Elevated lipids, ESRD on hemodialysis (HCC) (NEPHROLOGIST-  DR MATTINGLY), Gastroparesis, GERD (gastroesophageal reflux disease), History of pneumonia, Hyperlipidemia, Hyperthyroidism, Menorrhagia, Other secondary hypertension, Peripheral neuropathy, PONV (postoperative nausea and vomiting), Pre-transplant evaluation for kidney transplant, Renal failure syndrome (03/05/2022), Secondary hyperparathyroidism of renal origin [facility]), Sleep apnea, and Type 2 diabetes mellitus, with long-term current use of insulin (HCC).   has a past surgical history that includes Cesarean section (03/22/2001); AV fistula placement (08/2010); Ligation goretex fistula (01/04/11); Esophagogastroduodenoscopy (10/19/2011); Foot surgery (Left, 2014 approx.); left heart catheterization with coronary angiogram (N/A, 06/27/2012); AV fistula placement (Right, 05/07/2014); AV fistula placement (Right, 07/02/2014); Thrombectomy and revision of arterioventous (av) goretex  graft (Right, 07/16/2014); Cardiac catheterization (Left, 08/08/2014); Cardiac catheterization (Left, 08/08/2014); Ligation of arteriovenous  fistula (Left, 08/21/2014); Cardiac catheterization (Left, 03/08/2016);  transthoracic echocardiogram (06/04/2016); [facility] Cardiovascular stress test (06/25/2016   [facility]); Dilation and curettage of uterus (05/12/2000); Colonoscopy with esophagogastroduodenoscopy (egd) (10/19/2011); Laparoscopic cholecystectomy (1994); Femur IM nail (Left, child); Hysteroscopy with novasure (N/A, 08/19/2016); Colonoscopy; Polypectomy; Kidney transplant (09/18/2016); Ligation of arteriovenous  fistula (Left, 06/29/2017); and Nephrectomy transplanted organ.     Significant Hospital Events: Including procedures, antibiotic start and stop dates in addition to other pertinent events   10/09/2022 - ADMIT Respiratory culture no growth MRSA PCR negative Blood culture negative Respiratory virus panel: Negative 07/09 Transferred to ICU  07/10: Intubated  BAL: neg as of 7/20 7/13-improving O2 requirement 7/15 worsening renal function, fluid overloaded 7/15-tolerated hemodialysis  7/17-endotracheal tube was exchanged for mucous plugging Trach aspirate-20,000 colonies of Candida albicans [immunosuppressed patient] - 7/18-emergent bronchoscopy for mucous plugging  - Mucous plugging noted this morning, required bagging off the vent and bronchoscopy Was able to respond to name calling, was able to squeeze hands bilateral upper extremities, did not wiggle toes BAL -rare yeast 7/19: Febilr since 7/17 but wbc improved. On vent. On cleviprex due to severe BP along with opral meds -> cleviprex needs increaed. Per phmarcuy oon lot of PO BP medications. Fio2 30%. Culture negative so far.  On PRN fentanyl. SEdation gtt off due to attempts to fully wake her up/ 3.5 hours of dialysis. Mucous plugging noted again at around 4:30 PM despite Mucomyst.  -> ordere  increased to fET tube requent suctioning  10/23/2022: Respiratory reported spontaneous breathing trial attempt but patient continues to have mucous plugs and agitation with WUA ( though followed commands).   Currently on 50% FiO2.  On Cleviprex and Precedex.  Fever continues.  White count increased to 19.7 K. Diarrhea ++ - RN has stiopped lacutlose. No HD today per renal. Giving lasix gtt per renal Started 3% saline neb and scopolamine patch.  Off Mucomyst. 7/21 twice daily Lasix 120 7/22 weaned on PS, copious secretions 7/23 extubated, needed BiPAP 7/24 back on Cleviprex drip  Interim History / Subjective:  Improving from respiratory standpoint but continues to require Precedex drip. Complains of cough this morning productive sputum Low-grade febrile 100. On 4 L nasal cannula   Objective   Blood pressure (!) 160/75, pulse 91, temperature 98.3 F (36.8 C), temperature source Oral, resp. rate (!) 26, height 5\' 6"  (1.676 m), weight 117.5 kg, SpO2 96%.          Intake/Output Summary (Last 24 hours) at 10/28/2022 1032 Last data filed at 10/28/2022 1000 Gross per 24 hour  Intake 1685.98 ml  Output 2559 ml  Net -873.02 ml   Filed Weights   10/26/22 0500 10/27/22 0458 10/28/22 0500  Weight: 122.9 kg 117.2 kg 117.5 kg    Examination: General Appearance:   critically ill OBESE - + Head:  Normocephalic, without obvious abnormality, atraumatic Eyes:  PERRL - yes, conjunctiva/corneas - muddy     Ears:  Normal external ear canals, both ears Nose:  G tube - yes  Throat: Tubes out Neck:  Supple,  No enlargement/tenderness/nodules Lungs: Faint scattered rhonchi bilateral, no accessory muscle use Heart:  S1 and S2 normal, no murmur, CVP - no.  Pressors - no Abdomen:  Soft, no masses, no organomegaly Genitalia / Rectal:  Not done Extremities:  Extremities- intact Skin:  intact in exposed areas . Neurologic: Alert, interactive, anxious affect, nonfocal   Chest x-ray independently reviewed 7/24 shows stable bilateral interstitial and airspace opacities, decreased edema Labs show hypomagnesemia, stable anemia, no leukocytosis  Resolved Hospital Problem list   ARDS At risk for QT  prolongation -Precedex and Reglan DC'd New rash chest 10/24/22  Acute  encephalopathy   Plan   -Continue Abilify - Continue Wellbutrin  Assessment & Plan:   Acute hypoxemic respiratory failure ARDS-resolved Mucus plugging needing bronch 10/21/22 and 10/22/22 (s/p mucomst x 2 days ending 7/20) Extubated 7/23, required BiPAP  Plan -BiPAP as needed -Oxygen as needed -Tracheobronchial toilet, incentive spirometry -DuoNebs as needed for wheezing  Immunosuppressed status due to CellCept and chronic prednisone and tacrolimus; on Bactrim prophylaxis =- Prior to & Present on Admit Multilobar pneumonia idiopathic   Present on admit (empiric Rx)Completd course for Zosyn Fever curve and WBC trending down, repeat cultures negative  Plan -Holding off on Diflucan - DC CVL if PIV can be obtained, otherwise plan for midline     Severe Hypertension  7/21: On Cleviprex infusion since 10/17/2022.  On clonidine oral, scheduled along with prazosin scheduled.  Hydralazine as needed, labetalol as needed and Lopressor as needed/ .   Plan -Resumed home dose labetalol - continue cloninidine oral - continue parazosin oral -Continues to require Cleviprex, adding losartan    History of renal transplant secondary to acute renal failure S/p cadaveric renal transplantation on immunosuppressants -Tacrolimus toxicity Acute kidney injury on chronic kidney disease stage IIIa:  Receiving dialysis for anasarca: With volume removal   Last dialysis was 10/22/2022.  On high dose lasix since 10/23/22 Plan - per renal, Lasix decreased to 60 twice daily  -spironolactone increased to 50 mg daily -Continue tacrolimus, continue CellCept - Continue daily prednisone 5mg  per day - -On Bactrim PJP prophylaxis -Monitor urine output -Hypomagnesemia will be repleted   Type 1 diabetes  PLAN  - SSI   Gastroparesis due to DM Ileus onset 10/18/22 and persistent 10/22/22 Diarrhea - s/p flexiseal on 10/19/22 -  anti-diarrheals held 10/23/22  Plan   -Continue tube feeds while advancing oral - Reglan since 10/19/22    Anemia of chronic illness  Plan  - - PRBC for hgb </=  6.9gm%      May qualify for CIR. Continue in ICU while she requires Cleviprex  best Practice (right click and "Reselect all SmartList Selections" daily)   Diet/type: tubefeeds DVT prophylaxis: prophylactic heparin  GI prophylaxis: PPI Lines: N/A Foley:  N/A Code Status:  full code Last date of multidisciplinary goals of care discussion [discussed with significant other at bedside ,   Fianc updated at bedside daily     ATTESTATION & SIGNATURE   The patient MAJESTA LEICHTER is critically ill with multiple organ systems failure and requires high complexity decision making for assessment and support, frequent evaluation and titration of therapies, application of advanced monitoring technologies and extensive interpretation of multiple databases and discussion with other appropriate health care personnel such as bedside nurses, social workers, case Production designer, theatre/television/film, consultants, respiratory therapists, nutritionists, secretaries etc.,  Critical care time includes but is not restricted to just documentation time. Documentation can happen in parallel or sequential to care time depending on case mix urgency and priorities for the shift.   My independent critical care time was 31 minutes   Cyril Mourning MD. FCCP. [facility] Pager : [phone]  If no response to pager , please call [phone] until 7 pm After 7:00 pm call Elink  [phone]      10/28/2022 10:32 AM      LABS    PULMONARY Recent Labs  Lab 10/23/22 0602  PHART 7.477*  PCO2ART 34.5  PO2ART 67*  HCO3 25.5  TCO2 27  O2SAT 94    CBC Recent Labs  Lab 10/25/22 0405 10/27/22 0512 10/28/22 0400  HGB 8.2* 8.1* 8.1*  HCT 27.0* 26.5* 27.1*  WBC 9.6 8.4 7.2  PLT 288 282 283    COAGULATION No results for input(s): "INR" in the  last 168 hours.  CARDIAC  No results for input(s): "TROPONINI" in the last 168 hours. No results for input(s): "PROBNP" in the last 168 hours.   CHEMISTRY Recent Labs  Lab 10/23/22 0354 10/23/22 0602 10/24/22 0403 10/25/22 0405 10/26/22 0750 10/26/22 1550 10/27/22 0512 10/27/22 1506 10/28/22 0400  NA 134*   < > 134* 135 138 141 142 143 140  K 3.1*   < > 3.6 3.0* 3.3* 3.5 3.0* 4.5 4.3  CL 97*   < > 94* 101 106 106 104 107 105  CO2 25   < > 21* 23 23 24 26 26 24   GLUCOSE 122*   < > 210* 183* 199* 180* 188* 198* 280*  BUN 66*   < > 72* 67* 48* 44* 36* 31* 28*  CREATININE 1.88*   < > 1.62* 1.23* 0.90 0.87 0.80 0.75 0.78  CALCIUM 8.4*   < > 8.4* 8.2* 8.1* 8.3* 8.3* 8.4* 8.2*  MG 2.4  --  2.3 2.1  --   --   --   --  1.3*  PHOS  --   --  4.6 4.0 3.4  --  4.1  --  2.9   < > = values in this interval not displayed.   Estimated Creatinine Clearance: 114.6 mL/min (by C-G formula based on SCr of 0.78 mg/dL).   LIVER Recent Labs  Lab 10/23/22 0354 10/24/22 0403 10/25/22 0405 10/26/22 0750 10/27/22 0512 10/28/22 0400  AST 50* 43* 41  --   --   --   ALT 44 48* 51*  --   --   --   ALKPHOS 95 132* 157*  --   --   --  BILITOT 0.6 1.0 0.8  --   --   --   PROT 6.4* 6.3* 6.0*  --   --   --   ALBUMIN 2.4* 2.3* 2.1* 2.1* 2.0* 2.1*     INFECTIOUS Recent Labs  Lab 10/24/22 0403  LATICACIDVEN 0.9     ENDOCRINE CBG (last 3)  Recent Labs    10/27/22 2320 10/28/22 0319 10/28/22 0735  GLUCAP 253* 251* 240*

## 2022-10-28 NOTE — TOC Progression Note (Addendum)
Transition of Care [facility]) - Progression Note    Patient Details  Name: Linda Ellison MRN: [id number] Date of Birth: November 11, 1975  Transition of Care [facility]) CM/SW Contact  Harriet Masson, RN Phone Number: 10/28/2022, 11:12 AM  Clinical Narrative:      Patient was extubated on 10/26/22. On 4L 02 and Cleviprex. CIR out of network with patient's insurance.  Patient states she would like to talk to her fiance today regarding AIR and will call this RNCM when a decision is made. TOC following.        Expected Discharge Plan and Services                                               Social Determinants of Health (SDOH) Interventions SDOH Screenings   Food Insecurity: No Food Insecurity (08/27/2022)  Housing: Low Risk  (08/27/2022)  Transportation Needs: No Transportation Needs (08/27/2022)  Utilities: Not At Risk (08/27/2022)  Alcohol Screen: Low Risk  (11/21/2017)  Financial Resource Strain: Low Risk  (05/13/2021)   Received from [facility], [facility] visits prior to 06/05/2022., [facility] visits prior to 06/05/2022.  Physical Activity: Unknown (05/13/2021)   [facility] from [facility], [facility] visits prior to 06/05/2022., [facility] visits prior to 06/05/2022.  Social Connections: Unknown (08/16/2021)   Received from [facility], [facility]  Stress: No Stress Concern Present (05/13/2021)   [facility], [facility] visits prior to 06/05/2022., [facility] visits prior to 06/05/2022.  Tobacco Use: Low Risk  (10/10/2022)    Readmission Risk Interventions    10/11/2022    3:46 PM  Readmission Risk Prevention Plan  Transportation Screening Complete  Medication Review (RN Care Manager) Referral to Pharmacy  PCP or Specialist appointment within 3-5 days of discharge Complete  HRI or  Home Care Consult Complete  SW Recovery Care/Counseling Consult Complete  Palliative Care Screening Not Applicable  Skilled Nursing Facility Not Applicable

## 2022-10-28 NOTE — Inpatient Diabetes Management (Signed)
Inpatient Diabetes Program Recommendations  AACE/ADA: New Consensus Statement on Inpatient Glycemic Control (2015)  Target Ranges:  Prepandial:   less than 140 mg/dL      Peak postprandial:   less than 180 mg/dL (1-2 hours)      Critically ill patients:  140 - 180 mg/dL   Lab Results  Component Value Date   GLUCAP 290 (H) 10/28/2022   HGBA1C 6.7 (H) 08/27/2022    Review of Glycemic Control  Latest Reference Range & Units 10/27/22 19:26 10/27/22 23:20 10/28/22 03:19 10/28/22 07:35 10/28/22 11:08  Glucose-Capillary 70 - 99 mg/dL 782 (H) 956 (H) 213 (H) 240 (H) 290 (H)   Diabetes history: DM 1 Outpatient Diabetes medications:  Novolog in insulin pump- Pump setting per Endo: Basal: MN-1800 - 1.5               1800 - 1.6 - Total 36.9 units/24H CF - 20 with Goal of 110 ICR: 1:4  Current orders for Inpatient glycemic control:  Novolog 0-20 units q 4 hours Levemir 15 units q 12 hours  Inpatient Diabetes Program Recommendations:    Consider increasing Levemir to 18 units bid and add Novolog 3 units q 4 hours.  If Novolog tube feed coverage added, consider reducing Novolog correction to moderate q 4 hours.    Thanks,  Beryl Meager, RN, BC-ADM Inpatient Diabetes Coordinator Pager [phone] (8a-5p)

## 2022-10-28 NOTE — Progress Notes (Signed)
  Inpatient Rehab Admissions Coordinator :  Per therapy recommendations, patient was screened for CIR candidacy by Ottie Glazier RN MSN.  At this time patient appears to be a potential candidate for CIR level rehab, BUT [employer] is out of network with [facility].  Recommend TOC to pursue in network AIR level rehab. Please call me with any questions.  Ottie Glazier RN MSN Admissions Coordinator [phone]

## 2022-10-28 NOTE — Progress Notes (Signed)
eLink Physician-Brief Progress Note Patient Name: Linda Ellison DOB: July 24, 1975 MRN: [id number]   Date of Service  10/28/2022  HPI/Events of Note  AKI on CKD with renal transplant in place.  Brought in for severe ARDS and was extubated on 7/25.  She is quite anxious.  Already on aripiprazole and hydroxyzine at nighttime.  Requesting sleep aid.  eICU Interventions  Add trazodone     Intervention Category Minor Interventions: Routine modifications to care plan (e.g. PRN medications for pain, fever)  Alphonso Gregson 10/28/2022, 8:23 PM

## 2022-10-29 DIAGNOSIS — J9601 Acute respiratory failure with hypoxia: Secondary | ICD-10-CM | POA: Diagnosis not present

## 2022-10-29 DIAGNOSIS — I10 Essential (primary) hypertension: Secondary | ICD-10-CM | POA: Diagnosis not present

## 2022-10-29 DIAGNOSIS — J189 Pneumonia, unspecified organism: Secondary | ICD-10-CM | POA: Diagnosis not present

## 2022-10-29 LAB — GLUCOSE, CAPILLARY
Glucose-Capillary: 146 mg/dL — ABNORMAL HIGH (ref 70–99)
Glucose-Capillary: 208 mg/dL — ABNORMAL HIGH (ref 70–99)
Glucose-Capillary: 215 mg/dL — ABNORMAL HIGH (ref 70–99)
Glucose-Capillary: 261 mg/dL — ABNORMAL HIGH (ref 70–99)
Glucose-Capillary: 269 mg/dL — ABNORMAL HIGH (ref 70–99)
Glucose-Capillary: 292 mg/dL — ABNORMAL HIGH (ref 70–99)

## 2022-10-29 LAB — CBC WITH DIFFERENTIAL/PLATELET: Hemoglobin: 8.3 g/dL — ABNORMAL LOW (ref 12.0–15.0)

## 2022-10-29 MED ORDER — INSULIN ASPART 100 UNIT/ML IJ SOLN
4.0000 [IU] | INTRAMUSCULAR | Status: DC
  Administered 2022-10-29 – 2022-10-30 (×6): 4 [IU] via SUBCUTANEOUS

## 2022-10-29 MED ORDER — MAGNESIUM SULFATE 2 GM/50ML IV SOLN
2.0000 g | Freq: Once | INTRAVENOUS | Status: DC
Start: 2022-10-29 — End: 2022-10-29

## 2022-10-29 MED ORDER — INSULIN DETEMIR 100 UNIT/ML ~~LOC~~ SOLN
15.0000 [IU] | Freq: Two times a day (BID) | SUBCUTANEOUS | Status: DC
  Administered 2022-10-29 (×2): 15 [IU] via SUBCUTANEOUS
  Filled 2022-10-29 (×4): qty 0.15

## 2022-10-29 MED ORDER — LOSARTAN POTASSIUM 50 MG PO TABS
100.0000 mg | ORAL_TABLET | Freq: Every day | ORAL | Status: DC
  Administered 2022-10-29 – 2022-11-13 (×16): 100 mg via ORAL
  Filled 2022-10-29 (×16): qty 2

## 2022-10-29 MED ORDER — INSULIN ASPART 100 UNIT/ML IJ SOLN: 4.0000 [IU] | Freq: Four times a day (QID) | INTRAMUSCULAR | Status: DC

## 2022-10-29 NOTE — Progress Notes (Signed)
NAME:  Linda Ellison, MRN:  [id number], DOB:  [birth year], LOS: 19 ADMISSION DATE:  10/09/2022, CONSULTATION DATE: 10/12/2022 REFERRING MD: [facility], CHIEF COMPLAINT: Respiratory distress  BRIEF  47 year old female with refractory hypertension, admitted with community-acquired pneumonia.  She is status post cadaveric renal transplant and she is followed by nephrology.  Admission creatinine was 2.2, course complicated by mechanical ventilation and AKI requiring transient dialysis  Pertinent  Medical History    has a past medical history of Allergy, Anemia associated with chronic renal failure, Anxiety, Asthma, Atypical chest pain, AV (arteriovenous fistula) (HCC), CAD (coronary artery disease) (cardiologist-- dr Sharol Roussel [facility] [place] [facility] in [place])), Depression, Elevated lipids, ESRD on hemodialysis (HCC) (NEPHROLOGIST-  DR MATTINGLY), Gastroparesis, GERD (gastroesophageal reflux disease), History of pneumonia, Hyperlipidemia, Hyperthyroidism, Menorrhagia, Other secondary hypertension, Peripheral neuropathy, PONV (postoperative nausea and vomiting), Pre-transplant evaluation for kidney transplant, Renal failure syndrome (03/05/2022), Secondary hyperparathyroidism of renal origin [facility]), Sleep apnea, and Type 2 diabetes mellitus, with long-term current use of insulin (HCC).   has a past surgical history that includes Cesarean section (03/22/2001); AV fistula placement (08/2010); Ligation goretex fistula (01/04/11); Esophagogastroduodenoscopy (10/19/2011); Foot surgery (Left, 2014 approx.); left heart catheterization with coronary angiogram (N/A, 06/27/2012); AV fistula placement (Right, 05/07/2014); AV fistula placement (Right, 07/02/2014); Thrombectomy and revision of arterioventous (av) goretex  graft (Right, 07/16/2014); Cardiac catheterization (Left, 08/08/2014); Cardiac catheterization (Left, 08/08/2014); Ligation of arteriovenous  fistula (Left, 08/21/2014); Cardiac catheterization (Left, 03/08/2016);  transthoracic echocardiogram (06/04/2016); Dobutamine stress echo (06/04/2016    [facility]); Cardiovascular stress test (06/25/2016   [facility]); Dilation and curettage of uterus (05/12/2000); Colonoscopy with esophagogastroduodenoscopy (egd) (10/19/2011); Laparoscopic cholecystectomy (1994); Femur IM nail (Left, child); Hysteroscopy with novasure (N/A, 08/19/2016); Colonoscopy; Polypectomy; Kidney transplant (09/18/2016); Ligation of arteriovenous  fistula (Left, 06/29/2017); and Nephrectomy transplanted organ.     Significant Hospital Events: Including procedures, antibiotic start and stop dates in addition to other pertinent events   10/09/2022 - ADMIT Respiratory culture no growth MRSA PCR negative Blood culture negative Respiratory virus panel: Negative 07/09 Transferred to ICU  07/10: Intubated  BAL: neg as of 7/20 7/13-improving O2 requirement 7/15 worsening renal function, fluid overloaded 7/15-tolerated hemodialysis  7/17-endotracheal tube was exchanged for mucous plugging Trach aspirate-20,000 colonies of Candida albicans [immunosuppressed patient] - 7/18-emergent bronchoscopy for mucous plugging  - Mucous plugging noted this morning, required bagging off the vent and bronchoscopy Was able to respond to name calling, was able to squeeze hands bilateral upper extremities, did not wiggle toes BAL -rare yeast 7/19: Febilr since 7/17 but wbc improved. On vent. On cleviprex due to severe BP along with opral meds -> cleviprex needs increaed. Per phmarcuy oon lot of PO BP medications. Fio2 30%. Culture negative so far.  On PRN fentanyl. SEdation gtt off due to attempts to fully wake her up/ 3.5 hours of dialysis. Mucous plugging noted again at around 4:30 PM despite Mucomyst.  -> ordere  increased to fET tube requent suctioning  10/23/2022: Respiratory reported spontaneous breathing trial attempt but patient continues to have mucous plugs and agitation with WUA ( though followed commands).   Currently on 50% FiO2.  On Cleviprex and Precedex.  Fever continues.  White count increased to 19.7 K. Diarrhea ++ - RN has stiopped lacutlose. No HD today per renal. Giving lasix gtt per renal Started 3% saline neb and scopolamine patch.  Off Mucomyst. 7/21 twice daily Lasix 120 7/22 weaned on PS, copious secretions 7/23 extubated, needed BiPAP 7/24 back on Cleviprex drip  Interim History / Subjective:  Off oxygen this morning! Blood pressure remains high but off Cleviprex drip since yesterday.    Objective   Blood pressure (!) 157/80, pulse 92, temperature 98.4 F (36.9 C), temperature source Oral, resp. rate (!) 22, height 5\' 6"  (1.676 m), weight 117.9 kg, SpO2 93%.          Intake/Output Summary (Last 24 hours) at 10/29/2022 1001 Last data filed at 10/29/2022 0600 Gross per 24 hour  Intake 1545.93 ml  Output 2847 ml  Net -1301.07 ml   Filed Weights   10/27/22 0458 10/28/22 0500 10/29/22 0408  Weight: 117.2 kg 117.5 kg 117.9 kg    Examination: General Appearance: Recovering critically ill OBESE - + Head:  Normocephalic, without obvious abnormality, atraumatic Eyes:  PERRL - yes, conjunctiva/corneas - muddy     Nose:  G tube - yes  Throat: Tubes out Neck:  Supple,  No enlargement/tenderness/nodules Lungs: No accessory muscle use, faint scattered rhonchi Heart:  S1 and S2 normal, no murmur,  Abdomen:  Soft, no masses, no organomegaly Genitalia / Rectal:  Not done Extremities:  Extremities- intact Skin:  intact in exposed areas . Neurologic: Alert, interactive, stronger voice, nonfocal   Chest x-ray independently reviewed 7/24 shows stable bilateral interstitial and airspace opacities, decreased edema Labs show normal electrolytes, no leukocytosis, stable anemia  Resolved Hospital Problem list   ARDS At risk for QT prolongation -Precedex and Reglan DC'd New rash chest 10/24/22  Acute  encephalopathy   Plan   -Continue Abilify - Continue  Wellbutrin  Assessment & Plan:   Acute hypoxemic respiratory failure ARDS-resolved Mucus plugging needing bronch 10/21/22 and 10/22/22 (s/p mucomst x 2 days ending 7/20) Extubated 7/23, required BiPAP  Plan -Off BiPAP and oxygen -Tracheobronchial toilet, incentive spirometry -DuoNebs as needed for wheezing  Immunosuppressed status due to CellCept and chronic prednisone and tacrolimus; on Bactrim prophylaxis =- Prior to & Present on Admit Multilobar pneumonia idiopathic   Present on admit (empiric Rx)Completd course for Zosyn Fever curve and WBC trending down, repeat cultures negative  Plan -Holding off on Diflucan - DC CVL if PIV can be obtained, otherwise plan for midline     Severe Hypertension , uncontrolled On Cleviprex infusion since 7/14 -7/25  Plan -Resumed home dose labetalol - continue cloninidine oral - continue parazosin and hydralazine -Off Cleviprex, added losartan -increased to 100 mg today    History of renal transplant secondary to acute renal failure S/p cadaveric renal transplantation on immunosuppressants -Tacrolimus toxicity Acute kidney injury on chronic kidney disease stage IIIa:  Receiving dialysis for anasarca: With volume removal   Last dialysis was 10/22/2022.  On high dose lasix since 10/23/22 Plan - per renal, Lasix decreased to 60 twice daily  -spironolactone increased to 50 mg daily -Continue tacrolimus, continue CellCept - Continue daily prednisone 5mg  per day - -On Bactrim PJP prophylaxis -Monitor urine output   Type 1 diabetes, uncontrolled  PLAN  - SSI -Increase Levemir 15 every 12 -Added tube feed coverage   Gastroparesis due to DM Ileus onset 10/18/22 and persistent 10/22/22 Diarrhea - s/p flexiseal on 10/19/22 - anti-diarrheals held 10/23/22  Plan   -Continue tube feeds while advancing oral - Reglan since 10/19/22   May qualify for CIR.  Can transfer to floor and to [facility], PCCM available as needed  best Practice (right  click and "Reselect all SmartList Selections" daily)   Diet/type: tubefeeds DVT prophylaxis: prophylactic heparin  GI prophylaxis: PPI Lines: N/A Foley:  N/A Code Status:  full code Last date  of multidisciplinary goals of care discussion [discussed with significant other at bedside ,   Neysa Bonito /mom updated at bedside daily   Cyril Mourning MD. [facility]. [facility] Pager : [phone]  If no response to pager , please call [phone] until 7 pm After 7:00 pm call Elink  [phone]      10/29/2022 10:01 AM      LABS    PULMONARY Recent Labs  Lab 10/23/22 0602  PHART 7.477*  PCO2ART 34.5  PO2ART 67*  HCO3 25.5  TCO2 27  O2SAT 94    CBC Recent Labs  Lab 10/27/22 0512 10/28/22 0400 10/29/22 0004  HGB 8.1* 8.1* 8.3*  HCT 26.5* 27.1* 27.9*  WBC 8.4 7.2 8.1  PLT 282 283 270    COAGULATION No results for input(s): "INR" in the last 168 hours.  CARDIAC  No results for input(s): "TROPONINI" in the last 168 hours. No results for input(s): "PROBNP" in the last 168 hours.   CHEMISTRY Recent Labs  Lab 10/23/22 0354 10/23/22 0602 10/24/22 0403 10/25/22 0405 10/26/22 0750 10/26/22 1550 10/27/22 0512 10/27/22 1506 10/28/22 0400 10/29/22 0004  NA 134*   < > 134* 135 138 141 142 143 140 141  K 3.1*   < > 3.6 3.0* 3.3* 3.5 3.0* 4.5 4.3 4.1  CL 97*   < > 94* 101 106 106 104 107 105 109  CO2 25   < > 21* 23 23 24 26 26 24 22   GLUCOSE 122*   < > 210* 183* 199* 180* 188* 198* 280* 205*  BUN 66*   < > 72* 67* 48* 44* 36* 31* 28* 22*  CREATININE 1.88*   < > 1.62* 1.23* 0.90 0.87 0.80 0.75 0.78 0.67  CALCIUM 8.4*   < > 8.4* 8.2* 8.1* 8.3* 8.3* 8.4* 8.2* 8.6*  MG 2.4  --  2.3 2.1  --   --   --   --  1.3*  --   PHOS  --   --  4.6 4.0 3.4  --  4.1  --  2.9 3.5   < > = values in this interval not displayed.   Estimated Creatinine Clearance: 114.7 mL/min (by C-G formula based on SCr of 0.67 mg/dL).   LIVER Recent Labs  Lab 10/23/22 0354  10/24/22 0403 10/25/22 0405 10/26/22 0750 10/27/22 0512 10/28/22 0400 10/29/22 0004  AST 50* 43* 41  --   --   --   --   ALT 44 48* 51*  --   --   --   --   ALKPHOS 95 132* 157*  --   --   --   --   BILITOT 0.6 1.0 0.8  --   --   --   --   PROT 6.4* 6.3* 6.0*  --   --   --   --   ALBUMIN 2.4* 2.3* 2.1* 2.1* 2.0* 2.1* 2.3*     INFECTIOUS Recent Labs  Lab 10/24/22 0403  LATICACIDVEN 0.9     ENDOCRINE CBG (last 3)  Recent Labs    10/28/22 2327 10/29/22 0330 10/29/22 0804  GLUCAP 169* 269* 261*

## 2022-10-29 NOTE — Progress Notes (Signed)
Physical Therapy Treatment Patient Details Name: Linda Ellison MRN: [id number] DOB: [birth year] Today's Date: 10/29/2022   History of Present Illness 47 y.o. female presents to [facility] hospital on 10/09/2022 with CAP. Pt transferred to ICU on 7/9, intubated 7/10. HDU initiated on 7/15 due to fluid overload. Bronch 7/18. Extubated 10/26/2022. PMH includes renal transplant, DMII, HTN, HLD, OSA, CAD, hypothyroidism, gastroparesis, anxiety, depression.    PT Comments  Pt greeted resting in bed and eager for session with continued progress towards acute goals. Pt able to perform bed mobility with up to mod A to manage Les, with pt demonstrating fair recall for sequencing and hand placement on rails to assist with intermittent cues for reinforcement. Pt able to transfer to stand in stedy frame x2 trials with mod A to power up with pt demonstrating improved posture this session with cues for upright trunk and shifting hips anterior. Pt continues to fatigue quickly needing seated rest and cues for breathing techniques to slow RR. Current plan remains appropriate to address deficits and maximize functional independence and decrease caregiver burden. Pt continues to benefit from skilled PT services to progress toward functional mobility goals.      Assistance Recommended at Discharge Frequent or constant Supervision/Assistance  If plan is discharge home, recommend the following:  Can travel by private vehicle    Two people to help with walking and/or transfers;Two people to help with bathing/dressing/bathroom;Assistance with cooking/housework;Direct supervision/assist for medications management;Assist for transportation;Help with stairs or ramp for entrance      Equipment Recommendations  Other (comment) (TBD)    Recommendations for Other Services       Precautions / Restrictions Precautions Precautions: Fall Precaution Comments: flexiseal, CVC, coretrak Restrictions Weight Bearing Restrictions: No      Mobility  Bed Mobility Overal bed mobility: Needs Assistance Bed Mobility: Supine to Sit Rolling: Min assist   Supine to sit: Mod assist Sit to supine: Max assist   General bed mobility comments: Assist to bring BLE toward EOB with multimodal cueing. light assist to elevate trunk. Heavier assist to scoot toward edge of bed    Transfers Overall transfer level: Needs assistance Equipment used: Ambulation equipment used Transfers: Sit to/from Stand Sit to Stand: Max assist, Mod assist           General transfer comment: Assist to bring BUE up to stedy bar and pt able to stand with mod A on initialy stand and max A on second stand, good eccentric control to sit, improved upright posture this session    Ambulation/Gait                   Stairs             Wheelchair Mobility     Tilt Bed    Modified Rankin (Stroke Patients Only)       Balance Overall balance assessment: Needs assistance Sitting-balance support: Feet supported, Bilateral upper extremity supported Sitting balance-Leahy Scale: Poor Sitting balance - Comments: Min A progressing to min guard A at EOB.   Standing balance support: Bilateral upper extremity supported Standing balance-Leahy Scale: Poor Standing balance comment: heavy BUE use and assist to maintain balance                            Cognition Arousal/Alertness: Awake/alert Behavior During Therapy: WFL for tasks assessed/performed Overall Cognitive Status: Within Functional Limits for tasks assessed  General Comments: Pt very soft spoken, but following all commands; intermittently needing increased time for initiation, but believe due to weakness.        Exercises General Exercises - Lower Extremity Long Arc Quad: AROM, Right, Left, 10 reps, Seated (with isometric hold at end range) Hip Flexion/Marching: AROM, Right, Left, 10 reps, Seated    General Comments  General comments (skin integrity, edema, etc.): VSSon RA, RR up to 30 however pt able to control and lower with cues for breathing techniques      Pertinent Vitals/Pain Pain Assessment Pain Assessment: Faces Faces Pain Scale: Hurts a little bit Pain Location: generalized with mobility Pain Descriptors / Indicators: Discomfort Pain Intervention(s): Monitored during session, Limited activity within patient's tolerance    Home Living                          Prior Function            PT Goals (current goals can now be found in the care plan section) Acute Rehab PT Goals Patient Stated Goal: regain independence, mobilize PT Goal Formulation: With patient Time For Goal Achievement: 11/10/22 Progress towards PT goals: Progressing toward goals    Frequency    Min 1X/week      PT Plan Current plan remains appropriate    Co-evaluation              AM-PAC PT "6 Clicks" Mobility   Outcome Measure  Help needed turning from your back to your side while in a flat bed without using bedrails?: A Lot Help needed moving from lying on your back to sitting on the side of a flat bed without using bedrails?: A Lot Help needed moving to and from a bed to a chair (including a wheelchair)?: A Lot Help needed standing up from a chair using your arms (e.g., wheelchair or bedside chair)?: A Lot Help needed to walk in hospital room?: Total Help needed climbing 3-5 steps with a railing? : Total 6 Click Score: 10    End of Session   Activity Tolerance: Patient tolerated treatment well Patient left: with call bell/phone within reach;with family/visitor present;in bed;with bed alarm set Nurse Communication: Mobility status PT Visit Diagnosis: Other abnormalities of gait and mobility (R26.89);Muscle weakness (generalized) (M62.81);Difficulty in walking, not elsewhere classified (R26.2)     Time: 2202-5427 PT Time Calculation (min) (ACUTE ONLY): 33 min  Charges:     $Therapeutic Activity: 23-37 mins PT General Charges $$ ACUTE PT VISIT: 1 Visit                     Sukaina Toothaker R. PTA Acute Rehabilitation Services Office: [phone]   Catalina Antigua 10/29/2022, 3:50 PM

## 2022-10-29 NOTE — Progress Notes (Signed)
[facility] PROGRESS NOTE  Linda Ellison (DOB: 10-26-1975) JYN:[id number] PCP: Daylene Katayama, PA  Brief Narrative: Linda Ellison is a 47 y.o. female with a history of renal transplant who was admitted 7/6 with multifocal pneumonia ultimately requiring mechanical ventilation, complicated by renal failure requiring dialysis which has now improved. She's completed antibiotics, weaned to room air as of 7/26. We continue with a net negative balance with diuretics and stable renal function. Blood pressure elevation has improved with diuresis as well. Once off cleviprex infusion, patient transferred to hospitalist service 7/26.   Subjective: Feels better, very weak, ate clears without issue. No dyspnea.  Objective: BP (!) 166/80   Pulse 90   Temp 98.1 F (36.7 C) (Oral)   Resp (!) 23   Ht 5\' 6"  (1.676 m)   Wt 117.9 kg   SpO2 95%   BMI 41.95 kg/m   Gen: No distress, ill-appearing HEENT: Cortrak in place.  Pulm: Diminished, slight crackles, no wheezing  CV: RRR, no MRG. +edema GI: Soft, NT, ND, +BS  Neuro: Alert and oriented. No new focal deficits. Ext: Warm, no deformities. L forearm +thrill. No central line. 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  - Continue to support oxygenation if needed once beginning OOB/PT.  - Continue aggressive pulmonary hygiene.   Multifocal pneumonia in immunosuppressed patient:   Acute renal failure: Resolved, last HD was 7/19. Was due to septic shock. No longer requiring RRT. CrCl now >54ml/min. - Continue monitoring Cr while continuing IV diuresis.   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  - Continue labetalol, hydralazine, losartan, spironolactone (all added), norvasc, clonidine, prazosin.   T2DM:  - Continue levemir, SSI.  - Once able to advance diet (fulls tonight, soft in AM), DC NG tube and change to AC/HS checks.   Septic shock: Resolved  Acute metabolic encephalopathy: Improved  Weakness:  - Continue PT/OT, transfer to floor.  Tyrone Nine, MD [facility] [email or web address] 10/29/2022, 4:37 PM

## 2022-10-30 DIAGNOSIS — J9601 Acute respiratory failure with hypoxia: Secondary | ICD-10-CM | POA: Diagnosis not present

## 2022-10-30 DIAGNOSIS — J189 Pneumonia, unspecified organism: Secondary | ICD-10-CM | POA: Diagnosis not present

## 2022-10-30 DIAGNOSIS — I10 Essential (primary) hypertension: Secondary | ICD-10-CM | POA: Diagnosis not present

## 2022-10-30 LAB — GLUCOSE, CAPILLARY
Glucose-Capillary: 144 mg/dL — ABNORMAL HIGH (ref 70–99)
Glucose-Capillary: 165 mg/dL — ABNORMAL HIGH (ref 70–99)
Glucose-Capillary: 274 mg/dL — ABNORMAL HIGH (ref 70–99)
Glucose-Capillary: 217 mg/dL — ABNORMAL HIGH (ref 70–99)
Glucose-Capillary: 166 mg/dL — ABNORMAL HIGH (ref 70–99)

## 2022-10-30 MED ORDER — INSULIN ASPART 100 UNIT/ML IJ SOLN
0.0000 [IU] | Freq: Three times a day (TID) | INTRAMUSCULAR | Status: DC
  Administered 2022-10-30: 11 [IU] via SUBCUTANEOUS
  Administered 2022-10-30: 7 [IU] via SUBCUTANEOUS
  Administered 2022-10-31: 11 [IU] via SUBCUTANEOUS
  Administered 2022-10-31 (×2): 4 [IU] via SUBCUTANEOUS
  Administered 2022-11-01: 3 [IU] via SUBCUTANEOUS
  Administered 2022-11-01: 4 [IU] via SUBCUTANEOUS
  Administered 2022-11-01 – 2022-11-02 (×2): 7 [IU] via SUBCUTANEOUS
  Administered 2022-11-02 – 2022-11-03 (×4): 4 [IU] via SUBCUTANEOUS
  Administered 2022-11-03: 7 [IU] via SUBCUTANEOUS
  Administered 2022-11-04: 4 [IU] via SUBCUTANEOUS
  Administered 2022-11-04: 3 [IU] via SUBCUTANEOUS
  Administered 2022-11-04 – 2022-11-06 (×5): 4 [IU] via SUBCUTANEOUS
  Administered 2022-11-07: 3 [IU] via SUBCUTANEOUS
  Administered 2022-11-08: 4 [IU] via SUBCUTANEOUS

## 2022-10-30 MED ORDER — TACROLIMUS 1 MG PO CAPS
4.0000 mg | ORAL_CAPSULE | Freq: Two times a day (BID) | ORAL | Status: DC
  Administered 2022-10-30 – 2022-11-13 (×28): 4 mg via ORAL
  Filled 2022-10-30 (×28): qty 4

## 2022-10-30 MED ORDER — INSULIN ASPART 100 UNIT/ML IJ SOLN
4.0000 [IU] | Freq: Three times a day (TID) | INTRAMUSCULAR | Status: DC
  Administered 2022-10-30 – 2022-11-08 (×22): 4 [IU] via SUBCUTANEOUS

## 2022-10-30 NOTE — Progress Notes (Addendum)
[facility] PROGRESS NOTE  Linda Ellison (DOB: [birth year]) EXB:[id number] PCP: Daylene Katayama, PA  Brief Narrative: Linda Ellison is a 47 y.o. female with a history of renal transplant who was admitted 7/6 with multifocal pneumonia ultimately requiring mechanical ventilation, complicated by renal failure requiring dialysis which has now improved. She's completed antibiotics, weaned to room air as of 7/26. We continue with a net negative balance with diuretics and stable renal function. Blood pressure elevation has improved with diuresis as well. Once off cleviprex infusion, patient transferred to hospitalist service 7/26. Cortrak tube out 7/27. Continues IV lasix, anticipate CIR placement early next week.  Subjective: Now on medical floor bed, ate all of the breakfast that wasn't delivered cold. No nausea, vomiting, abd pain reported. Has been using IS playing a game to see how high she can get the indicator.  Objective: BP (!) 188/81   Pulse 96   Temp 98.2 F (36.8 C) (Oral)   Resp 18   Ht 5\' 6"  (1.676 m)   Wt 117.9 kg   SpO2 98%   BMI 41.95 kg/m   Gen: No distress Pulm: Some coarse crackles bilaterally, nonlabored on room air, decreased expansion  CV: RRR, no MRG, trace pitting edema dependently GI: Soft, NT, ND, +BS Neuro: Alert and oriented. No new focal deficits. Ext: Warm, no deformities. Skin: SCDs in place. L distal forearm AVF w/o erythema, warmth or tenderness. 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  - Continue to support oxygenation if needed once beginning OOB/PT.  - Continue aggressive pulmonary hygiene. D/w pt use of IS frequently.  Multifocal pneumonia in immunosuppressed patient:  - Has completed antibiotics. Currently afebrile with resolution of leukocytosis.   Acute renal failure: Resolved, last HD was 7/19. Was due to septic shock. No longer requiring RRT. CrCl now >71ml/min. - Continue monitoring Cr while continuing IV diuresis.  - Still volume up, bed weights stable, unclear accuracy. Will continue IV lasix at least 24 more hours.   s/p renal transplant:  - Continue immunosuppression with prednisone, tacrolimus, cellcept, three times per week bactrim.  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  - Continue labetalol, hydralazine, losartan, spironolactone (all added), norvasc, clonidine, prazosin. Augment regimen if not continuing to improve with diuresis.  T2DM:  - Continue novolog scheduled + SSI, change to AC/HS  - DC cortrak as po intake improved.  Septic shock: Resolved  Acute metabolic encephalopathy: Improved  Weakness:  - Continue PT/OT > anticipate CIR admission early this coming week.  Tyrone Nine, MD [facility] [email or web address]  10/30/2022, 10:05 AM

## 2022-10-31 DIAGNOSIS — J9601 Acute respiratory failure with hypoxia: Secondary | ICD-10-CM | POA: Diagnosis not present

## 2022-10-31 DIAGNOSIS — I10 Essential (primary) hypertension: Secondary | ICD-10-CM | POA: Diagnosis not present

## 2022-10-31 DIAGNOSIS — J189 Pneumonia, unspecified organism: Secondary | ICD-10-CM | POA: Diagnosis not present

## 2022-10-31 LAB — GLUCOSE, CAPILLARY
Glucose-Capillary: 279 mg/dL — ABNORMAL HIGH (ref 70–99)
Glucose-Capillary: 170 mg/dL — ABNORMAL HIGH (ref 70–99)
Glucose-Capillary: 178 mg/dL — ABNORMAL HIGH (ref 70–99)
Glucose-Capillary: 157 mg/dL — ABNORMAL HIGH (ref 70–99)

## 2022-10-31 LAB — BASIC METABOLIC PANEL
Anion gap: 14 (ref 5–15)
BUN: 12 mg/dL (ref 6–20)
CO2: 18 mmol/L — ABNORMAL LOW (ref 22–32)
Calcium: 8.6 mg/dL — ABNORMAL LOW (ref 8.9–10.3)
Chloride: 105 mmol/L (ref 98–111)
Creatinine, Ser: 0.8 mg/dL (ref 0.44–1.00)
GFR, Estimated: >60 mL/min (ref >60)
Glucose, Bld: 300 mg/dL — ABNORMAL HIGH (ref 70–99)
Potassium: 4.1 mmol/L (ref 3.5–5.1)
Sodium: 137 mmol/L (ref 135–145)

## 2022-10-31 MED ORDER — OXYCODONE HCL 5 MG PO TABS
5.0000 mg | ORAL_TABLET | Freq: Four times a day (QID) | ORAL | Status: DC | PRN
  Administered 2022-10-31 – 2022-11-05 (×6): 5 mg via ORAL
  Filled 2022-10-31 (×6): qty 1

## 2022-10-31 MED ORDER — INSULIN DETEMIR 100 UNIT/ML ~~LOC~~ SOLN
15.0000 [IU] | Freq: Every day | SUBCUTANEOUS | Status: DC
  Administered 2022-10-31 – 2022-11-01 (×2): 15 [IU] via SUBCUTANEOUS
  Filled 2022-10-31 (×3): qty 0.15

## 2022-10-31 NOTE — Plan of Care (Signed)
  Problem: Fluid Volume: Goal: Ability to maintain a balanced intake and output will improve Outcome: Progressing   Problem: Health Behavior/Discharge Planning: Goal: Ability to manage health-related needs will improve Outcome: Progressing   Problem: Metabolic: Goal: Ability to maintain appropriate glucose levels will improve Outcome: Progressing   Problem: Nutritional: Goal: Maintenance of adequate nutrition will improve Outcome: Progressing Goal: Progress toward achieving an optimal weight will improve Outcome: Progressing   Problem: Skin Integrity: Goal: Risk for impaired skin integrity will decrease Outcome: Progressing   Problem: Tissue Perfusion: Goal: Adequacy of tissue perfusion will improve Outcome: Progressing   Problem: Education: Goal: Knowledge of General Education information will improve Description: Including pain rating scale, medication(s)/side effects and non-pharmacologic comfort measures Outcome: Progressing   Problem: Health Behavior/Discharge Planning: Goal: Ability to manage health-related needs will improve Outcome: Progressing   Problem: Clinical Measurements: Goal: Ability to maintain clinical measurements within normal limits will improve Outcome: Progressing Goal: Will remain free from infection Outcome: Progressing Goal: Diagnostic test results will improve Outcome: Progressing Goal: Respiratory complications will improve Outcome: Progressing Goal: Cardiovascular complication will be avoided Outcome: Progressing   Problem: Nutrition: Goal: Adequate nutrition will be maintained Outcome: Progressing   Problem: Coping: Goal: Level of anxiety will decrease Outcome: Progressing   Problem: Elimination: Goal: Will not experience complications related to bowel motility Outcome: Progressing Goal: Will not experience complications related to urinary retention Outcome: Progressing   Problem: Pain Managment: Goal: General experience of  comfort will improve Outcome: Progressing   Problem: Safety: Goal: Ability to remain free from injury will improve Outcome: Progressing   Problem: Skin Integrity: Goal: Risk for impaired skin integrity will decrease Outcome: Progressing   Problem: Cardiac: Goal: Ability to maintain an adequate cardiac output will improve Outcome: Progressing   Problem: Fluid Volume: Goal: Ability to achieve a balanced intake and output will improve Outcome: Progressing   Problem: Metabolic: Goal: Ability to maintain appropriate glucose levels will improve Outcome: Progressing   Problem: Nutritional: Goal: Maintenance of adequate nutrition will improve Outcome: Progressing Goal: Maintenance of adequate weight for body size and type will improve Outcome: Progressing   Problem: Respiratory: Goal: Will regain and/or maintain adequate ventilation Outcome: Progressing   Problem: Urinary Elimination: Goal: Ability to achieve and maintain adequate renal perfusion and functioning will improve Outcome: Progressing   Problem: Activity: Goal: Ability to tolerate increased activity will improve Outcome: Progressing   Problem: Respiratory: Goal: Ability to maintain a clear airway and adequate ventilation will improve Outcome: Progressing

## 2022-10-31 NOTE — Progress Notes (Signed)
[facility] PROGRESS NOTE  Linda Ellison (DOB: May 25, 1975) HYQ:[id number] PCP: Daylene Katayama, PA  Brief Narrative: Linda Ellison is a 47 y.o. female with a history of renal transplant who was admitted 7/6 with multifocal pneumonia ultimately requiring mechanical ventilation, complicated by renal failure requiring dialysis which has now improved. She's completed antibiotics, weaned to room air as of 7/26. We continue with a net negative balance with diuretics and stable renal function. Blood pressure elevation has improved with diuresis as well. Once off cleviprex infusion, patient transferred to hospitalist service 7/26. Cortrak tube out 7/27. Continues IV lasix, anticipate CIR placement early next week.  Subjective: Still having coughing spells, but breathing better, using IS. No other complaints. No chest pain.   Objective: BP (!) 153/80   Pulse 98   Temp 98.4 F (36.9 C) (Oral)   Resp 18   Ht 5\' 6"  (1.676 m)   Wt 116.8 kg   SpO2 93%   BMI 41.56 kg/m   Gen: No distress Pulm: Crackles at BL bases, no wheezes, nonlabored on room air  CV: RRR, 1+ pitting edema at lower legs GI: Soft, NT, ND, +BS Neuro: Alert and oriented. No new focal deficits. Ext: Warm, no deformities. 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  - Continue to support oxygenation if needed once beginning OOB/PT.  - Continue aggressive pulmonary hygiene. D/w pt use of IS frequently.  Multifocal pneumonia in immunosuppressed patient:  - Has completed antibiotics. Currently afebrile with resolution of leukocytosis. Continue measured antitussives.  Acute renal failure: Resolved, last HD was 7/19. Was due to septic shock. No longer requiring RRT. CrCl now >7ml/min. - Continue monitoring Cr while continuing IV diuresis.  - Still volume up, bed weights stable, unclear accuracy. Cr 0.8 with BUN 12 this AM. Still some edema. UOP seems to have fallen off over past 24 hours. Will continue IV diuresis an additional day and repeat BMP in AM.  s/p renal transplant:  - Continue immunosuppression with prednisone, tacrolimus, cellcept, three times per week bactrim.  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  - Continue labetalol, hydralazine, losartan, spironolactone (all added), norvasc, clonidine, prazosin. BP improving.  T2DM:  - Continue novolog scheduled + SSI, changed to AC/HS. Hyperglycemia on BMP is postprandial specimen this AM.   Septic shock: Resolved  Acute metabolic encephalopathy: Improved  Weakness:  - Continue PT/OT > anticipate CIR admission early this coming week.  Tyrone Nine, MD [facility] [email or web address] 10/31/2022, 9:23 AM

## 2022-10-31 NOTE — Plan of Care (Signed)
Problem: Fluid Volume: Goal: Ability to maintain a balanced intake and output will improve 10/31/2022 1744 by Margarita Grizzle, RN Outcome: Progressing 10/31/2022 1643 by Margarita Grizzle, RN Outcome: Progressing   Problem: Health Behavior/Discharge Planning: Goal: Ability to manage health-related needs will improve 10/31/2022 1744 by Margarita Grizzle, RN Outcome: Progressing 10/31/2022 1643 by Margarita Grizzle, RN Outcome: Progressing   Problem: Metabolic: Goal: Ability to maintain appropriate glucose levels will improve 10/31/2022 1744 by Margarita Grizzle, RN Outcome: Progressing 10/31/2022 1643 by Margarita Grizzle, RN Outcome: Progressing   Problem: Nutritional: Goal: Maintenance of adequate nutrition will improve 10/31/2022 1744 by Margarita Grizzle, RN Outcome: Progressing 10/31/2022 1643 by Margarita Grizzle, RN Outcome: Progressing Goal: Progress toward achieving an optimal weight will improve 10/31/2022 1744 by Margarita Grizzle, RN Outcome: Progressing 10/31/2022 1643 by Margarita Grizzle, RN Outcome: Progressing   Problem: Skin Integrity: Goal: Risk for impaired skin integrity will decrease 10/31/2022 1744 by Margarita Grizzle, RN Outcome: Progressing 10/31/2022 1643 by Margarita Grizzle, RN Outcome: Progressing   Problem: Tissue Perfusion: Goal: Adequacy of tissue perfusion will improve 10/31/2022 1744 by Margarita Grizzle, RN Outcome: Progressing 10/31/2022 1643 by Margarita Grizzle, RN Outcome: Progressing   Problem: Education: Goal: Knowledge of General Education information will improve Description: Including pain rating scale, medication(s)/side effects and non-pharmacologic comfort measures 10/31/2022 1744 by Margarita Grizzle, RN Outcome: Progressing 10/31/2022 1643 by Margarita Grizzle, RN Outcome: Progressing   Problem: Health Behavior/Discharge Planning: Goal: Ability to manage health-related needs will improve 10/31/2022 1744 by Margarita Grizzle, RN Outcome: Progressing 10/31/2022 1643 by  Margarita Grizzle, RN Outcome: Progressing   Problem: Clinical Measurements: Goal: Ability to maintain clinical measurements within normal limits will improve 10/31/2022 1744 by Margarita Grizzle, RN Outcome: Progressing 10/31/2022 1643 by Margarita Grizzle, RN Outcome: Progressing Goal: Will remain free from infection 10/31/2022 1744 by Margarita Grizzle, RN Outcome: Progressing 10/31/2022 1643 by Margarita Grizzle, RN Outcome: Progressing Goal: Diagnostic test results will improve 10/31/2022 1744 by Margarita Grizzle, RN Outcome: Progressing 10/31/2022 1643 by Margarita Grizzle, RN Outcome: Progressing Goal: Respiratory complications will improve 10/31/2022 1744 by Margarita Grizzle, RN Outcome: Progressing 10/31/2022 1643 by Margarita Grizzle, RN Outcome: Progressing Goal: Cardiovascular complication will be avoided 10/31/2022 1744 by Margarita Grizzle, RN Outcome: Progressing 10/31/2022 1643 by Margarita Grizzle, RN Outcome: Progressing   Problem: Nutrition: Goal: Adequate nutrition will be maintained 10/31/2022 1744 by Margarita Grizzle, RN Outcome: Progressing 10/31/2022 1643 by Margarita Grizzle, RN Outcome: Progressing   Problem: Coping: Goal: Level of anxiety will decrease 10/31/2022 1744 by Margarita Grizzle, RN Outcome: Progressing 10/31/2022 1643 by Margarita Grizzle, RN Outcome: Progressing   Problem: Elimination: Goal: Will not experience complications related to bowel motility 10/31/2022 1744 by Margarita Grizzle, RN Outcome: Progressing 10/31/2022 1643 by Margarita Grizzle, RN Outcome: Progressing Goal: Will not experience complications related to urinary retention 10/31/2022 1744 by Margarita Grizzle, RN Outcome: Progressing 10/31/2022 1643 by Margarita Grizzle, RN Outcome: Progressing   Problem: Pain Managment: Goal: General experience of comfort will improve 10/31/2022 1744 by Margarita Grizzle, RN Outcome: Progressing 10/31/2022 1643 by Margarita Grizzle, RN Outcome: Progressing   Problem: Safety: Goal: Ability to  remain free from injury will improve 10/31/2022 1744 by Margarita Grizzle, RN Outcome: Progressing 10/31/2022 1643 by Margarita Grizzle, RN Outcome: Progressing   Problem: Skin Integrity: Goal: Risk for impaired skin integrity will decrease 10/31/2022 1744 by Margarita Grizzle, RN Outcome: Progressing 10/31/2022 1643 by Margarita Grizzle, RN Outcome: Progressing   Problem: Cardiac: Goal: Ability to maintain an adequate cardiac output will improve 10/31/2022 1744 by Margarita Grizzle, RN  Outcome: Progressing 10/31/2022 1643 by Margarita Grizzle, RN Outcome: Progressing   Problem: Fluid Volume: Goal: Ability to achieve a balanced intake and output will improve 10/31/2022 1744 by Margarita Grizzle, RN Outcome: Progressing 10/31/2022 1643 by Margarita Grizzle, RN Outcome: Progressing   Problem: Metabolic: Goal: Ability to maintain appropriate glucose levels will improve 10/31/2022 1744 by Margarita Grizzle, RN Outcome: Progressing 10/31/2022 1643 by Margarita Grizzle, RN Outcome: Progressing   Problem: Nutritional: Goal: Maintenance of adequate nutrition will improve 10/31/2022 1744 by Margarita Grizzle, RN Outcome: Progressing 10/31/2022 1643 by Margarita Grizzle, RN Outcome: Progressing Goal: Maintenance of adequate weight for body size and type will improve 10/31/2022 1744 by Margarita Grizzle, RN Outcome: Progressing 10/31/2022 1643 by Margarita Grizzle, RN Outcome: Progressing   Problem: Respiratory: Goal: Will regain and/or maintain adequate ventilation 10/31/2022 1744 by Margarita Grizzle, RN Outcome: Progressing 10/31/2022 1643 by Margarita Grizzle, RN Outcome: Progressing   Problem: Urinary Elimination: Goal: Ability to achieve and maintain adequate renal perfusion and functioning will improve 10/31/2022 1744 by Margarita Grizzle, RN Outcome: Progressing 10/31/2022 1643 by Margarita Grizzle, RN Outcome: Progressing   Problem: Activity: Goal: Ability to tolerate increased activity will improve 10/31/2022 1744 by Margarita Grizzle, RN Outcome: Progressing 10/31/2022 1643 by Margarita Grizzle, RN Outcome: Progressing   Problem: Respiratory: Goal: Ability to maintain a clear airway and adequate ventilation will improve 10/31/2022 1744 by Margarita Grizzle, RN Outcome: Progressing 10/31/2022 1643 by Margarita Grizzle, RN Outcome: Progressing

## 2022-11-01 DIAGNOSIS — J189 Pneumonia, unspecified organism: Secondary | ICD-10-CM | POA: Diagnosis not present

## 2022-11-01 LAB — GLUCOSE, CAPILLARY
Glucose-Capillary: 235 mg/dL — ABNORMAL HIGH (ref 70–99)
Glucose-Capillary: 192 mg/dL — ABNORMAL HIGH (ref 70–99)
Glucose-Capillary: 138 mg/dL — ABNORMAL HIGH (ref 70–99)
Glucose-Capillary: 163 mg/dL — ABNORMAL HIGH (ref 70–99)

## 2022-11-01 MED ORDER — INSULIN DETEMIR 100 UNIT/ML ~~LOC~~ SOLN
9.0000 [IU] | Freq: Two times a day (BID) | SUBCUTANEOUS | Status: DC
  Administered 2022-11-02 – 2022-11-08 (×13): 9 [IU] via SUBCUTANEOUS
  Filled 2022-11-01 (×14): qty 0.09

## 2022-11-01 MED ORDER — FUROSEMIDE 40 MG PO TABS
60.0000 mg | ORAL_TABLET | Freq: Two times a day (BID) | ORAL | Status: DC
  Administered 2022-11-01 – 2022-11-04 (×6): 60 mg via ORAL
  Filled 2022-11-01 (×6): qty 1

## 2022-11-01 NOTE — Progress Notes (Signed)
Nutrition Follow-up  DOCUMENTATION CODES:   Obesity unspecified  INTERVENTION:  Boost Breeze po BID, each supplement provides 250 kcal and 9 grams of protein  Please document meal intake percentages, even if it is 0%.   Encourage po intake  Consider liberalizing diet if patient's po intake continues to be poor  NUTRITION DIAGNOSIS:   Inadequate oral intake related to inability to eat as evidenced by NPO status. -ongoing   GOAL:   Provide needs based on ASPEN/SCCM guidelines -not progressing with po intake alone   MONITOR:   TF tolerance, I & O's, Vent status, Labs  REASON FOR ASSESSMENT:   Rounds, Ventilator, Consult Enteral/tube feeding initiation and management  ASSESSMENT:   Pt with hx of prior ESRD on HD now with renal transplant 2018, DM type 2, HTN, HLD, CAD, gastroparesis, and GERD presented to ED with weakness and SOB. Imaging suggestive of pneumonia.  Visited patient at bedside who has flat affect. He mother was present. Per chart review, TF's d/c'd 7/27. Patient reports that she is not eating much and that we are quite far from her baseline appetite. This would likely explain why patient has a lack in meal intake documentation.   RD discussed ONS options with patient. She reports she does not tolerate dairy products. She did not want to try Boost/Ensure. She is agreeable to boost breeze, however, RD does not think this is the ideal supplement for her especially since her blood glucose has been high (170-280), but this is the only supplement she is accepting to. Elevated blood glucose is also attributed to patient taking Prednisone daily.   Cortrak was likely removed prematurely and patient would benefit from nocturnal feeds as her appetite has yet to return. RD anticipates patient to continue to lose weight during hospital course due to poor po intake.   Potential plans for rehab sometime this week   Labs: CBG 235 Meds: lipitor, folvite, insulin, MVI,  deltasone, aldactone, bactrim  Wt: admit- 262#, current 257.5# -4.5# wt loss since admission  PO: no po documented since 7/9   I/O's: -12 L since admission   Diet Order:   Diet Order             Diet heart healthy/carb modified Room service appropriate? Yes; Fluid consistency: Thin  Diet effective now                   EDUCATION NEEDS:   Not appropriate for education at this time  Skin:  Skin Assessment: Reviewed RN Assessment  Last BM:  7/27  Height:   Ht Readings from Last 1 Encounters:  10/21/22 5\' 6"  (1.676 m)    Weight:   Wt Readings from Last 1 Encounters:  10/31/22 116.8 kg    Ideal Body Weight:  59.1 kg  BMI:  Body mass index is 41.56 kg/m.  Estimated Nutritional Needs:   Kcal:  1800-2100kcal/d  Protein:  100-120g/d  Fluid:  1.8L/d    Linda Ellison, RDN, LDN  Clinical Nutrition

## 2022-11-01 NOTE — Plan of Care (Signed)
  Problem: Fluid Volume: Goal: Ability to maintain a balanced intake and output will improve Outcome: Progressing   Problem: Health Behavior/Discharge Planning: Goal: Ability to manage health-related needs will improve Outcome: Progressing   Problem: Metabolic: Goal: Ability to maintain appropriate glucose levels will improve Outcome: Progressing   Problem: Nutritional: Goal: Maintenance of adequate nutrition will improve Outcome: Progressing Goal: Progress toward achieving an optimal weight will improve Outcome: Progressing   Problem: Skin Integrity: Goal: Risk for impaired skin integrity will decrease Outcome: Progressing   Problem: Tissue Perfusion: Goal: Adequacy of tissue perfusion will improve Outcome: Progressing   Problem: Education: Goal: Knowledge of General Education information will improve Description: Including pain rating scale, medication(s)/side effects and non-pharmacologic comfort measures Outcome: Progressing   Problem: Health Behavior/Discharge Planning: Goal: Ability to manage health-related needs will improve Outcome: Progressing   Problem: Clinical Measurements: Goal: Ability to maintain clinical measurements within normal limits will improve Outcome: Progressing Goal: Will remain free from infection Outcome: Progressing Goal: Diagnostic test results will improve Outcome: Progressing Goal: Respiratory complications will improve Outcome: Progressing Goal: Cardiovascular complication will be avoided Outcome: Progressing   Problem: Nutrition: Goal: Adequate nutrition will be maintained Outcome: Progressing   Problem: Coping: Goal: Level of anxiety will decrease Outcome: Progressing   Problem: Elimination: Goal: Will not experience complications related to bowel motility Outcome: Progressing Goal: Will not experience complications related to urinary retention Outcome: Progressing   Problem: Pain Managment: Goal: General experience of  comfort will improve Outcome: Progressing   Problem: Safety: Goal: Ability to remain free from injury will improve Outcome: Progressing   Problem: Skin Integrity: Goal: Risk for impaired skin integrity will decrease Outcome: Progressing   Problem: Cardiac: Goal: Ability to maintain an adequate cardiac output will improve Outcome: Progressing   Problem: Fluid Volume: Goal: Ability to achieve a balanced intake and output will improve Outcome: Progressing   Problem: Metabolic: Goal: Ability to maintain appropriate glucose levels will improve Outcome: Progressing   Problem: Nutritional: Goal: Maintenance of adequate nutrition will improve Outcome: Progressing Goal: Maintenance of adequate weight for body size and type will improve Outcome: Progressing   Problem: Respiratory: Goal: Will regain and/or maintain adequate ventilation Outcome: Progressing   Problem: Urinary Elimination: Goal: Ability to achieve and maintain adequate renal perfusion and functioning will improve Outcome: Progressing   Problem: Activity: Goal: Ability to tolerate increased activity will improve Outcome: Progressing   Problem: Respiratory: Goal: Ability to maintain a clear airway and adequate ventilation will improve Outcome: Progressing

## 2022-11-01 NOTE — Progress Notes (Signed)
Physical Therapy Treatment Patient Details Name: Linda Ellison MRN: [id number] DOB: May 09, 1975 Today's Date: 11/01/2022   History of Present Illness 47 y.o. female presents to [facility] hospital on 10/09/2022 with CAP. Pt transferred to ICU on 7/9, intubated 7/10. HDU initiated on 7/15 due to fluid overload. Bronch 7/18. Extubated 10/26/2022. PMH includes renal transplant, DMII, HTN, HLD, OSA, CAD, hypothyroidism, gastroparesis, anxiety, depression.    PT Comments  Pt received in supine, agreeable to therapy session after getting bath by NT, pt needing up to min guard for bed mobility using rail/HOB elevated and up to modA for transfers from elevated surface height. Pt needing min to modA for standing pre-gait tasks in [place] standing frame but quick to fatigue in stance and pt diaphoretic but VSS, recommend checking orthostatics next session, pt states too fatigued to stand long enough for BP assessment today. Pt with good effort for seated/standing BLE exercises as detailed below. Pt agreeable to sit up in chair >1 hour at end of session. Pt continues to benefit from PT services to progress toward functional mobility goals.     If plan is discharge home, recommend the following: Two people to help with walking and/or transfers;Two people to help with bathing/dressing/bathroom;Assistance with cooking/housework;Direct supervision/assist for medications management;Assist for transportation;Help with stairs or ramp for entrance   Can travel by private vehicle        Equipment Recommendations  Other (comment) (TBD)    Recommendations for Other Services       Precautions / Restrictions Precautions Precautions: Fall Precaution Comments: LUE AV fistula Restrictions Weight Bearing Restrictions: No     Mobility  Bed Mobility Overal bed mobility: Needs Assistance Bed Mobility: Rolling, Sidelying to Sit Rolling: Modified independent (Device/Increase time) Sidelying to sit: Min guard, HOB elevated        General bed mobility comments: Min initial cues for sequencing, pt with improved initiation today; heavy reliance on bed rail/HOB elevated >20* prior to sitting up.    Transfers Overall transfer level: Needs assistance Equipment used: Ambulation equipment used Transfers: Sit to/from Stand Sit to Stand: Mod assist, Min assist, From elevated surface           General transfer comment: Cues for UE placement and pt able to stand with mod A after bed height elevated (unable to stand on first trial from lower bed height) and from elevated Stedy seat x2 reps with minA. Pt performed x5 squats (chair push-ups with hips lifted off surface) from chair but not fully standing upright from chair due to fatigue.    Ambulation/Gait Ambulation/Gait assistance: Min assist   Assistive device:  Personal assistant)       Pre-gait activities: standing hip flexion x5 reps x2 sets with seated break between     Praxair Mobility     Tilt Bed    Modified Rankin (Stroke Patients Only)       Balance Overall balance assessment: Needs assistance Sitting-balance support: Feet supported, Bilateral upper extremity supported Sitting balance-Leahy Scale: Poor Sitting balance - Comments: Min A progressing to min guard A at EOB.   Standing balance support: Bilateral upper extremity supported Standing balance-Leahy Scale: Poor Standing balance comment: heavy BUE use and assist to maintain balance                            Cognition Arousal/Alertness: Awake/alert Behavior During Therapy: [facility] for tasks assessed/performed  Overall Cognitive Status: Within Functional Limits for tasks assessed                                 General Comments: Pt very soft spoken, but following all commands; intermittently needing increased time for initiation, but believe due to weakness.        Exercises General Exercises - Lower Extremity Ankle Circles/Pumps:  AROM, Both, 5 reps, Supine Long Arc Quad: AROM, Right, Left, 10 reps, Seated (with isometric hold at end range) Hip ABduction/ADduction: AROM, Both, 10 reps, Seated, Other (comment) (pillow squeezes sitting in chair, x5 sec hold ea rep) Hip Flexion/Marching: Right, Left, 10 reps, Seated, AROM, Standing, 5 reps (x5 standing x2 sets, x10 seated on EOB) Other Exercises Other Exercises: chair push-ups x5 reps with elbows extended from chair, pt lifting hips up off seat each rep Other Exercises: STS x 2 from elevated Stedy seat and marches (see above) with limited ROM, more of a weight shift than a march    General Comments General comments (skin integrity, edema, etc.): SpO2 99% on RA with sitting EOB and post-transfer to chair, HR 90-95 bpm with exertion and 83 bpm resting. BP 135/80 sitting in chair a few mins after resting at end of session. Mildly diaphoretic with exertional tasks but pt denies dizziness or lightheadedness.      Pertinent Vitals/Pain Pain Assessment Pain Assessment: Faces Faces Pain Scale: Hurts a little bit Pain Location: generalized with mobility Pain Descriptors / Indicators: Discomfort, Grimacing Pain Intervention(s): Monitored during session, Repositioned    Home Living                          Prior Function            PT Goals (current goals can now be found in the care plan section) Acute Rehab PT Goals Patient Stated Goal: regain independence, mobilize PT Goal Formulation: With patient Time For Goal Achievement: 11/10/22 Progress towards PT goals: Progressing toward goals    Frequency    Min 1X/week      PT Plan Current plan remains appropriate    Co-evaluation              AM-PAC PT "6 Clicks" Mobility   Outcome Measure  Help needed turning from your back to your side while in a flat bed without using bedrails?: A Little (w/o rail) Help needed moving from lying on your back to sitting on the side of a flat bed without using  bedrails?: A Little Help needed moving to and from a bed to a chair (including a wheelchair)?: A Lot Help needed standing up from a chair using your arms (e.g., wheelchair or bedside chair)?: A Lot Help needed to walk in hospital room?: Total Help needed climbing 3-5 steps with a railing? : Total 6 Click Score: 12    End of Session Equipment Utilized During Treatment: Gait belt (VSS on RA) Activity Tolerance: Patient tolerated treatment well Patient left: with call bell/phone within reach;with family/visitor present;in chair;Other (comment) (mother in law present in the room) Nurse Communication: Mobility status;Need for lift equipment Antony Salmon for return transfers) PT Visit Diagnosis: Other abnormalities of gait and mobility (R26.89);Muscle weakness (generalized) (M62.81);Difficulty in walking, not elsewhere classified (R26.2)     Time: 0347-4259 PT Time Calculation (min) (ACUTE ONLY): 33 min  Charges:    $Therapeutic Exercise: 8-22 mins $Therapeutic Activity: 8-22 mins PT General  Charges $$ ACUTE PT VISIT: 1 Visit                     Martasia Talamante P., PTA Acute Rehabilitation Services Secure Chat Preferred 9a-5:30pm Office: [phone]    Dorathy Kinsman [facility] 11/01/2022, 2:39 PM

## 2022-11-01 NOTE — TOC Progression Note (Addendum)
Transition of Care [facility]) - Progression Note    Patient Details  Name: Linda Ellison MRN: [id number] Date of Birth: 02-Aug-1975  Transition of [facility]) CM/SW Contact  Ronny Bacon, RN Phone Number: 11/01/2022, 10:29 AM  Clinical Narrative:  Spoke with [facility] Liaison regard patient looking for AIR options. Facesheet and H&P sent to fax number [phone]. Liaison will look into health coverage to see if patient would be covered for their services.  1122- Per Victorino Dike with [facility] due to patient insurance would need a single case agreement and it can take weeks to go through. Offered for [place] to try other facilities to see if they can do anything sooner. Spoke with patient at bedside to inform of same. Patient agrees  for [facility] to fax out referral for [facility].         Expected Discharge Plan and Services                                               Social Determinants of Health (SDOH) Interventions SDOH Screenings   Food Insecurity: No Food Insecurity (08/27/2022)  Housing: Low Risk  (08/27/2022)  Transportation Needs: No Transportation Needs (08/27/2022)  Utilities: Not At Risk (08/27/2022)  Alcohol Screen: Low Risk  (11/21/2017)  Financial Resource Strain: Low Risk  (05/13/2021)   [facility], [facility] prior to [facility]/05/[facility]22[facility] visits prior to 06/05/2022.  Physical Activity: Unknown (05/13/2021)   [facility], [facility] visits prior to 06/05/2022., [facility], [facility] visits prior to 06/05/2022.  Social Connections: Unknown (08/16/2021)   Received from [facility], [facility]  Stress: No Stress Concern Present (05/13/2021)   [facility], [facility] prior to 06/05/2022., [facility] visits prior to 06/05/2022.   Tobacco Use: Low Risk  (10/10/2022)    Readmission Risk Interventions    10/11/2022    3:46 PM  Readmission Risk Prevention Plan  Transportation Screening Complete  Medication Review (RN Care Manager) Referral to Pharmacy  PCP or Specialist appointment within 3-5 days of discharge Complete  HRI or Home Care Consult Complete  SW Recovery Care/Counseling Consult Complete  Palliative Care Screening Not Applicable  Skilled Nursing Facility Not Applicable

## 2022-11-01 NOTE — Progress Notes (Signed)
PROGRESS NOTE    Linda Ellison  WUJ:[id number] DOB: 1975-11-23 DOA: 10/09/2022 PCP: Daylene Katayama, PA  Chief Complaint  Patient presents with   Chest Pain    Brief Narrative:   Linda Ellison is Linda Ellison 47 y.o. female with Linda Ellison history of renal transplant who was admitted 7/6 with multifocal pneumonia ultimately requiring mechanical ventilation, complicated by renal failure requiring dialysis which has now improved. She's completed antibiotics, weaned to room air as of 7/26. We continue with Linda Ellison net negative balance with diuretics and stable renal function. Blood pressure elevation has improved with diuresis as well. Once off cleviprex infusion, patient transferred to hospitalist service 7/26. Cortrak tube out 7/27. We're diuresing with oral diuretics.     10/09/2022 - ADMIT Respiratory culture no growth MRSA PCR negative Blood culture negative Respiratory virus panel: Negative 07/09 Transferred to ICU  07/10: Intubated  BAL: neg as of 7/20 7/13-improving O2 requirement 7/15 worsening renal function, fluid overloaded 7/15-tolerated hemodialysis  7/17-endotracheal tube was exchanged for mucous plugging Trach aspirate-20,000 colonies of Candida albicans [immunosuppressed patient] - 7/18-emergent bronchoscopy for mucous plugging  - Mucous plugging noted this morning, required bagging off the vent and bronchoscopy Was able to respond to name calling, was able to squeeze hands bilateral upper extremities, did not wiggle toes BAL -rare yeast 7/19: Febilr since 7/17 but wbc improved. On vent. On cleviprex due to severe BP along with opral meds -> cleviprex needs increaed. Per phmarcuy oon lot of PO BP medications. Fio2 30%. Culture negative so far.  On PRN fentanyl. SEdation gtt off due to attempts to fully wake her up/ 3.5 hours of dialysis. Mucous plugging noted again at around 4:30 PM despite Mucomyst.  -> ordere  increased to fET tube requent suctioning   10/23/2022: Respiratory reported  spontaneous breathing trial attempt but patient continues to have mucous plugs and agitation with WUA ( though followed commands).  Currently on 50% FiO2.  On Cleviprex and Precedex.  Fever continues.  White count increased to 19.7 K. Diarrhea ++ - RN has stiopped lacutlose. No HD today per renal. Giving lasix gtt per renal Started 3% saline neb and scopolamine patch.  Off Mucomyst. 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  - Continue to support oxygenation if needed once beginning OOB/PT.  - Continue aggressive pulmonary hygiene.   Multifocal pneumonia in immunosuppressed patient:  - Has completed antibiotics. Currently afebrile with resolution of leukocytosis. Continue measured  antitussives.   Acute renal failure: Resolved, last HD was 7/19. Was due to septic shock. No longer requiring RRT. CrCl now >64ml/min. -  Continue monitoring Cr while continuing IV diuresis.  - net negative 22.5 L  - will transition to PO lasix, follow UOP S   s/p renal transplant:  - Continue immunosuppression with prednisone, tacrolimus, cellcept, three times per week bactrim.   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  - Continue labetalol, hydralazine, losartan, spironolactone (all added), norvasc, clonidine, prazosin. BP improving.   T2DM:  - Continue novolog scheduled + SSI, changed to AC/HS.    Septic shock: Resolved   Acute metabolic encephalopathy: Improved   Weakness:  - Continue PT/OT > out of network for CIR, we're waiting to hear from outside acute inpatient rehabs    DVT prophylaxis: heparin Code Status: full Family Communication: mother at bedside Disposition:   Status is: Inpatient Remains inpatient appropriate because: continued need for inpatient care   Consultants:  PCCM renal  Procedures:  See above  Antimicrobials:  Anti-infectives (From admission, onward)    Start     Dose/Rate Route Frequency Ordered Stop   10/29/22 1000  sulfamethoxazole-trimethoprim (BACTRIM) 400-80 MG per tablet 1 tablet        1 tablet Oral Once per day on Monday Wednesday Friday 10/27/22 1317     10/23/22 1545  fluconazole (DIFLUCAN) IVPB 200 mg  Status:  Discontinued        200 mg 100 mL/hr over 60 Minutes Intravenous Every 24 hours 10/23/22 1449 10/23/22 1449   10/23/22 1545  fluconazole (DIFLUCAN) IVPB 200 mg  Status:  Discontinued        200 mg 100 mL/hr over 60 Minutes Intravenous Every 24 hours 10/23/22 1449 10/23/22 1455   10/18/22 1000  sulfamethoxazole-trimethoprim (BACTRIM) 400-80 MG per tablet 1 tablet  Status:  Discontinued        1 tablet Per Tube Once per day on Monday Wednesday Friday 10/17/22 1108 10/27/22 1317   10/16/22 1200  sulfamethoxazole-trimethoprim (BACTRIM) 400 mg of trimethoprim in dextrose 5 % 500 mL IVPB  Status:  Discontinued        400 mg of trimethoprim 350 mL/hr over  90 Minutes Intravenous Every 6 hours 10/16/22 1041 10/17/22 1018   10/15/22 1400  sulfamethoxazole-trimethoprim (BACTRIM) 550 mg of trimethoprim in dextrose 5 % 1,000 mL IVPB  Status:  Discontinued        550 mg of trimethoprim 689.6 mL/hr over 90 Minutes Intravenous Every 8 hours 10/15/22 0937 10/15/22 0940   10/15/22 1200  sulfamethoxazole-trimethoprim (BACTRIM) 550 mg of trimethoprim in dextrose 5 % 1,000 mL IVPB  Status:  Discontinued        550 mg of trimethoprim 689.6 mL/hr over 90 Minutes Intravenous Every 6 hours 10/15/22 0940 10/16/22 1041   10/13/22 1030  sulfamethoxazole-trimethoprim (BACTRIM) 1,000 mg of trimethoprim in dextrose 5 % 1,000 mL IVPB  Status:  Discontinued        1,000 mg of trimethoprim 708.3 mL/hr over 90 Minutes Intravenous Every 6 hours 10/13/22 0942 10/13/22 0945   10/13/22 1030  sulfamethoxazole-trimethoprim (BACTRIM) 550 mg of trimethoprim in dextrose 5 % 1,000 mL IVPB  Status:  Discontinued        550 mg of trimethoprim 689.6 mL/hr over 90 Minutes Intravenous Every 6 hours 10/13/22 0945 10/15/22 0937   10/13/22 1000  doxycycline (VIBRA-TABS) tablet 100 mg  Status:  Discontinued        100 mg Per Tube Every 12 hours 10/13/22  4132 10/13/22 0954   10/12/22 1600  piperacillin-tazobactam (ZOSYN) IVPB 3.375 g        3.375 g 12.5 mL/hr over 240 Minutes Intravenous Every 8 hours 10/12/22 1516 10/18/22 2030   10/11/22 1230  ceFEPIme (MAXIPIME) 2 g in sodium chloride 0.9 % 100 mL IVPB  Status:  Discontinued        2 g 200 mL/hr over 30 Minutes Intravenous Every 12 hours 10/11/22 1158 10/12/22 1508   10/11/22 1000  doxycycline (VIBRA-TABS) tablet 100 mg  Status:  Discontinued        100 mg Oral Daily 10/10/22 0117 10/10/22 0126   10/11/22 0000  ceFEPIme (MAXIPIME) 2 g in sodium chloride 0.9 % 100 mL IVPB  Status:  Discontinued        2 g 200 mL/hr over 30 Minutes Intravenous Every 24 hours 10/10/22 0122 10/10/22 0123   10/11/22 0000  ceFEPIme (MAXIPIME) 1 g in sodium  chloride 0.9 % 100 mL IVPB  Status:  Discontinued        1 g 200 mL/hr over 30 Minutes Intravenous Every 24 hours 10/10/22 0123 10/11/22 1158   10/10/22 0215  doxycycline (VIBRA-TABS) tablet 100 mg  Status:  Discontinued        100 mg Oral Every 12 hours 10/10/22 0126 10/13/22 0730   10/09/22 2315  ceFEPIme (MAXIPIME) 2 g in sodium chloride 0.9 % 100 mL IVPB        2 g 200 mL/hr over 30 Minutes Intravenous  Once 10/09/22 2303 10/10/22 0135   10/09/22 2130  doxycycline (VIBRAMYCIN) 100 mg in sodium chloride 0.9 % 250 mL IVPB        100 mg 125 mL/hr over 120 Minutes Intravenous  Once 10/09/22 2115 10/10/22 0011   10/09/22 2053  ceFEPIme (MAXIPIME) 1 g in sodium chloride 0.9 % 100 mL IVPB  Status:  Discontinued        1 g 200 mL/hr over 30 Minutes Intravenous Every 24 hours 10/09/22 2054 10/09/22 2303   10/09/22 2045  ceFEPIme (MAXIPIME) 2 g in sodium chloride 0.9 % 100 mL IVPB  Status:  Discontinued        2 g 200 mL/hr over 30 Minutes Intravenous Every 24 hours 10/09/22 2040 10/09/22 2054       Subjective: No complaints Mother at bedside  Objective: Vitals:   11/01/22 0413 11/01/22 0757 11/01/22 0833 11/01/22 1423  BP: (!) 150/77 (!) 177/88  135/80  Pulse: 90 94  83  Resp: 18 (!) 22    Temp: 98.4 F (36.9 C) 98.4 F (36.9 C)    TempSrc:  Oral    SpO2: 97% 91% 95% 95%  Weight:      Height:        Intake/Output Summary (Last 24 hours) at 11/01/2022 1525 Last data filed at 11/01/2022 0427 Gross per 24 hour  Intake --  Output 2300 ml  Net -2300 ml   Filed Weights   10/28/22 0500 10/29/22 0408 10/31/22 0709  Weight: 117.5 kg 117.9 kg 116.8 kg    Examination:  General exam: Appears calm and comfortable  Respiratory system: Clear to auscultation. Respiratory effort normal. Cardiovascular system: RRR Gastrointestinal system: Abdomen is nondistended, soft and nontender.  Central nervous system: Alert and oriented. No focal neurological deficits. Extremities: trace LE  edema    Data Reviewed: I have personally reviewed following labs and imaging studies  CBC: Recent Labs  Lab 10/27/22 0512 10/28/22 0400 10/29/22 0004  WBC 8.4 7.2 8.1  NEUTROABS 6.9 5.7 6.3  HGB 8.1* 8.1* 8.3*  HCT 26.5* 27.1* 27.9*  MCV 85.2 84.4 85.3  PLT 282 283 270    Basic Metabolic Panel: Recent Labs  Lab 10/26/22 0750 10/26/22 1550 10/27/22 0512 10/27/22 1506 10/28/22 0400 10/29/22 0004 10/30/22 0146 10/31/22 0838 11/01/22 0224  NA 138   < > 142   < > 140 141 136 137 138  K 3.3*   < > 3.0*   < > 4.3 4.1 3.6 4.1 3.6  CL 106   < > 104   < > 105 109 104 105 103  CO2 23   < > 26   < > 24 22 21* 18* 20*  GLUCOSE 199*   < > 188*   < > 280* 205* 130* 300* 173*  BUN 48*   < > 36*   < > 28* 22* 22* 12 11  CREATININE 0.90   < > 0.80   < > 0.78 0.67 0.74 0.80 0.71  CALCIUM 8.1*   < > 8.3*   < > 8.2* 8.6* 8.4* 8.6* 8.7*  MG  --   --   --   --  1.3*  --   --   --   --   PHOS 3.4  --  4.1  --  2.9 3.5 4.1  --   --    < > = values in this interval not displayed.    GFR: Estimated Creatinine Clearance: 114.2 mL/min (by C-G formula based on SCr of 0.71 mg/dL).  Liver Function Tests: Recent Labs  Lab 10/26/22 0750 10/27/22 0512 10/28/22 0400 10/29/22 0004 10/30/22 0146  ALBUMIN 2.1* 2.0* 2.1* 2.3* 2.3*    CBG: Recent Labs  Lab 10/31/22 1126 10/31/22 1643 10/31/22 2148 11/01/22 0754 11/01/22 1135  GLUCAP 170* 178* 157* 235* 192*     Recent Results (from the past 240 hour(s))  Culture, blood (Routine X 2) w Reflex to ID Panel     Status: None   Collection Time: 10/26/22  8:44 AM   Specimen: BLOOD RIGHT HAND  Result Value Ref Range Status   Specimen Description BLOOD RIGHT HAND  Final   Special Requests   Final    BOTTLES DRAWN AEROBIC AND ANAEROBIC Blood Culture adequate volume   Culture   Final    NO GROWTH 5 DAYS Performed at [facility], [address] [place], [place] [address]    Report Status 10/31/2022 FINAL  Final  Culture, blood  (Routine X 2) w Reflex to ID Panel     Status: None   Collection Time: 10/26/22  8:50 AM   Specimen: BLOOD RIGHT HAND  Result Value Ref Range Status   Specimen Description BLOOD RIGHT HAND  Final   Special Requests   Final    BOTTLES DRAWN AEROBIC AND ANAEROBIC Blood Culture adequate volume   Culture   Final    NO GROWTH 5 DAYS Performed at [facility], [address] [place], [place] [address]    Report Status 10/31/2022 FINAL  Final         Radiology Studies: No results found.      Scheduled Meds:  amLODipine  10 mg Oral Daily   arformoterol  15 mcg Nebulization BID   ARIPiprazole  30 mg Oral Daily   atorvastatin  10 mg Oral Daily   buPROPion  225 mg Oral BID   cloNIDine  0.3 mg Oral TID   folic acid  1 mg Oral  Daily   guaiFENesin  400 mg Oral Q4H   heparin  5,000 Units Subcutaneous Q8H   hydrALAZINE  100 mg Oral Q8H   hydrOXYzine  25 mg Oral QHS   insulin aspart  0-20 Units Subcutaneous TID WC   insulin aspart  4 Units Subcutaneous TID WC   insulin detemir  15 Units Subcutaneous Daily   labetalol  400 mg Oral Q8H   losartan  100 mg Oral Daily   montelukast  10 mg Oral QHS   multivitamin with minerals  1 tablet Oral Daily   mycophenolate  720 mg Oral BID   mouth rinse  15 mL Mouth Rinse 4 times per day   prazosin  1 mg Oral QHS   predniSONE  5 mg Oral Q breakfast   revefenacin  175 mcg Nebulization Daily   spironolactone  50 mg Oral Daily   sulfamethoxazole-trimethoprim  1 tablet Oral Once per day on Monday Wednesday Friday   tacrolimus  4 mg Oral BID   Continuous Infusions:  sodium chloride Stopped (10/28/22 0826)     LOS: 22 days    Time spent: oer 30 min    Lacretia Nicks, MD [facility]   To contact the attending provider between 7A-7P or the covering provider during after hours 7P-7A, please log into the web site [email or web address] and access using universal [place] password for that web site. If you do not have the password,  please call the hospital operator.  11/01/2022, 3:25 PM

## 2022-11-01 NOTE — Care Management Important Message (Signed)
Important Message  Patient Details  Name: Linda Ellison MRN: [id number] Date of Birth: [birth year]   Medicare Important Message Given:  Yes     Christiann Hagerty Stefan Church 11/01/2022, 4:13 PM

## 2022-11-02 DIAGNOSIS — J189 Pneumonia, unspecified organism: Secondary | ICD-10-CM | POA: Diagnosis not present

## 2022-11-02 LAB — BASIC METABOLIC PANEL
Anion gap: 16 — ABNORMAL HIGH (ref 5–15)
BUN: 10 mg/dL (ref 6–20)
CO2: 18 mmol/L — ABNORMAL LOW (ref 22–32)
Calcium: 8.7 mg/dL — ABNORMAL LOW (ref 8.9–10.3)
Chloride: 104 mmol/L (ref 98–111)
Creatinine, Ser: 0.85 mg/dL (ref 0.44–1.00)
GFR, Estimated: >60 mL/min (ref >60)
Glucose, Bld: 242 mg/dL — ABNORMAL HIGH (ref 70–99)
Potassium: 3.8 mmol/L (ref 3.5–5.1)
Sodium: 138 mmol/L (ref 135–145)

## 2022-11-02 LAB — GLUCOSE, CAPILLARY
Glucose-Capillary: 230 mg/dL — ABNORMAL HIGH (ref 70–99)
Glucose-Capillary: 155 mg/dL — ABNORMAL HIGH (ref 70–99)
Glucose-Capillary: 154 mg/dL — ABNORMAL HIGH (ref 70–99)
Glucose-Capillary: 95 mg/dL (ref 70–99)
Glucose-Capillary: 111 mg/dL — ABNORMAL HIGH (ref 70–99)

## 2022-11-02 LAB — TACROLIMUS LEVEL: Tacrolimus (FK506) - LabCorp: 3.7 ng/mL (ref 2.0–20.0)

## 2022-11-02 LAB — MAGNESIUM: Magnesium: 1.3 mg/dL — ABNORMAL LOW (ref 1.7–2.4)

## 2022-11-02 MED ORDER — MAGNESIUM SULFATE 4 GM/100ML IV SOLN
4.0000 g | Freq: Once | INTRAVENOUS | Status: AC
  Administered 2022-11-02: 4 g via INTRAVENOUS
  Filled 2022-11-02: qty 100

## 2022-11-02 NOTE — TOC Progression Note (Signed)
Transition of Care [facility]) - Progression Note    Patient Details  Name: Linda Ellison MRN: [id number] Date of Birth: October 08, 1975  Transition of Care [facility]) CM/SW Contact  Lawerance Sabal, RN Phone Number: 11/02/2022, 12:11 PM  Clinical Narrative:     Spoke at bedside with patient, visitors, and Silvio Pate from [facility] IR [phone].  Shelia reviewed with patient IR program, and discussed if patient would be interested in going. Patient unsure at this time if she feels comfortable being away from home for so long.  I discussed levels of support available to her at IR vs SNF, and encouraged IR. Patient instructed to work to her full ability with PT to assess level of support she will needfor ADLs if she chooses to go home.  She states that her fiance Mikle Bosworth and her live together and he is able to support 24/7, he currently is on disability and is home with her.        Expected Discharge Plan and Services                                               Social Determinants of Health (SDOH) Interventions SDOH Screenings   Food Insecurity: No Food Insecurity (08/27/2022)  Housing: Low Risk  (08/27/2022)  Transportation Needs: No Transportation Needs (08/27/2022)  Utilities: Not At Risk (08/27/2022)  Alcohol Screen: Low Risk  (11/21/2017)  Financial Resource Strain: Low Risk  (05/13/2021)   [facility] from [facility], [facility] visits prior to 06/05/2022., [facility] visits prior to 06/05/2022.  Physical Activity: Unknown (05/13/2021)   [facility], [facility] prior to 06/05/2022., [facility] visits prior to 06/05/2022.  Social Connections: Unknown (08/16/2021)   Received from [facility], [facility]  Stress: No Stress Concern Present (05/13/2021)   Received from [facility], [facility] visits prior to 06/05/2022., [facility] visits prior to 06/05/2022.  Tobacco Use: Low Risk  (10/10/2022)    Readmission Risk Interventions    10/11/2022    3:46 PM  Readmission Risk Prevention Plan  Transportation Screening Complete  Medication Review (RN Care Manager) Referral to Pharmacy  PCP or Specialist appointment within 3-5 days of discharge Complete  HRI or Home Care Consult Complete  SW Recovery Care/Counseling Consult Complete  Palliative Care Screening Not Applicable  Skilled Nursing Facility Not Applicable

## 2022-11-02 NOTE — Progress Notes (Signed)
PROGRESS NOTE    Linda Ellison  ZOX:[id number] DOB: Oct 07, 1975 DOA: 10/09/2022 PCP: Daylene Katayama, PA  Chief Complaint  Patient presents with   Chest Pain    Brief Narrative:   Linda Ellison is Linda Ellison 47 y.o. female with Gilberte Gorley history of renal transplant who was admitted 7/6 with multifocal pneumonia ultimately requiring mechanical ventilation, complicated by renal failure requiring dialysis which has now improved. She's completed antibiotics, weaned to room air as of 7/26. We continue with Jayra Choyce net negative balance with diuretics and stable renal function. Blood pressure elevation has improved with diuresis as well. Once off cleviprex infusion, patient transferred to hospitalist service 7/26. Cortrak tube out 7/27. We're diuresing with oral diuretics.     10/09/2022 - ADMIT Respiratory culture no growth MRSA PCR negative Blood culture negative Respiratory virus panel: Negative 07/09 Transferred to ICU  07/10: Intubated  BAL: neg as of 7/20 7/13-improving O2 requirement 7/15 worsening renal function, fluid overloaded 7/15-tolerated hemodialysis  7/17-endotracheal tube was exchanged for mucous plugging Trach aspirate-20,000 colonies of Candida albicans [immunosuppressed patient] - 7/18-emergent bronchoscopy for mucous plugging  - Mucous plugging noted this morning, required bagging off the vent and bronchoscopy Was able to respond to name calling, was able to squeeze hands bilateral upper extremities, did not wiggle toes BAL -rare yeast 7/19: Febilr since 7/17 but wbc improved. On vent. On cleviprex due to severe BP along with opral meds -> cleviprex needs increaed. Per phmarcuy oon lot of PO BP medications. Fio2 30%. Culture negative so far.  On PRN fentanyl. SEdation gtt off due to attempts to fully wake her up/ 3.5 hours of dialysis. Mucous plugging noted again at around 4:30 PM despite Mucomyst.  -> ordere  increased to fET tube requent suctioning   10/23/2022: Respiratory reported  spontaneous breathing trial attempt but patient continues to have mucous plugs and agitation with WUA ( though followed commands).  Currently on 50% FiO2.  On Cleviprex and Precedex.  Fever continues.  White count increased to 19.7 K. Diarrhea ++ - RN has stiopped lacutlose. No HD today per renal. Giving lasix gtt per renal Started 3% saline neb and scopolamine patch.  Off Mucomyst. 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  - Continue to support oxygenation if needed once beginning OOB/PT.  - Continue aggressive pulmonary hygiene.   Multifocal pneumonia in immunosuppressed patient:  - Has completed antibiotics. Currently afebrile with resolution of leukocytosis. Continue measured  antitussives.   Acute renal failure: Resolved, last HD was 7/19. Was due to septic shock. No longer requiring RRT. CrCl now >76ml/min. -  Continue monitoring Cr while continuing IV diuresis.  - net negative 20.2 L  - will transition to PO lasix, follow UOP   s/p renal transplant:  - Continue immunosuppression with prednisone, tacrolimus, cellcept, three times per week bactrim.   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  - Continue labetalol, hydralazine, losartan, spironolactone (all added), norvasc, clonidine, prazosin. BP improving.   T2DM:  - Continue novolog scheduled + SSI, changed to AC/HS.    Septic shock: Resolved   Acute metabolic encephalopathy: Improved   Weakness:  - Continue PT/OT > out of network for CIR, we're waiting to hear from outside acute inpatient rehabs --- she's not sure she wants to go to inpatient rehab in [place] - I recommended this as the best option to get her stronger in the fastest way possible.  Please discuss with therapy if (and when) home with home health with family support might be option?  (She tells me she'd have Yanira Tolsma lot of family support)    DVT prophylaxis: heparin Code Status: full Family Communication: mother at bedside Disposition:   Status is: Inpatient Remains inpatient appropriate because: continued need for inpatient care   Consultants:  PCCM renal  Procedures:  See above  Antimicrobials:  Anti-infectives (From admission, onward)    Start     Dose/Rate Route Frequency Ordered Stop   10/29/22 1000  sulfamethoxazole-trimethoprim (BACTRIM) 400-80 MG per tablet 1 tablet        1 tablet Oral Once per day on Monday Wednesday Friday 10/27/22 1317     10/23/22 1545  fluconazole (DIFLUCAN) IVPB 200 mg  Status:  Discontinued        200 mg 100 mL/hr over 60 Minutes Intravenous Every 24 hours 10/23/22 1449 10/23/22 1449   10/23/22 1545  fluconazole (DIFLUCAN) IVPB 200 mg  Status:  Discontinued        200 mg 100 mL/hr over 60 Minutes Intravenous Every 24 hours 10/23/22 1449 10/23/22 1455   10/18/22 1000  sulfamethoxazole-trimethoprim (BACTRIM) 400-80 MG per  tablet 1 tablet  Status:  Discontinued        1 tablet Per Tube Once per day on Monday Wednesday Friday 10/17/22 1108 10/27/22 1317   10/16/22 1200  sulfamethoxazole-trimethoprim (BACTRIM) 400 mg of trimethoprim in dextrose 5 % 500 mL IVPB  Status:  Discontinued        400 mg of trimethoprim 350 mL/hr over 90 Minutes Intravenous Every 6 hours 10/16/22 1041 10/17/22 1018   10/15/22 1400  sulfamethoxazole-trimethoprim (BACTRIM) 550 mg of trimethoprim in dextrose 5 % 1,000 mL IVPB  Status:  Discontinued        550 mg of trimethoprim 689.6 mL/hr over 90 Minutes Intravenous Every 8 hours 10/15/22 0937 10/15/22 0940   10/15/22 1200  sulfamethoxazole-trimethoprim (BACTRIM) 550 mg of trimethoprim in dextrose 5 % 1,000 mL IVPB  Status:  Discontinued        550 mg of trimethoprim 689.6 mL/hr over 90 Minutes Intravenous Every 6 hours 10/15/22 0940 10/16/22 1041   10/13/22 1030  sulfamethoxazole-trimethoprim (BACTRIM) 1,000 mg of trimethoprim in dextrose 5 % 1,000 mL IVPB  Status:  Discontinued        1,000 mg of trimethoprim 708.3 mL/hr over 90 Minutes Intravenous Every 6 hours 10/13/22 0942 10/13/22 0945   10/13/22 1030  sulfamethoxazole-trimethoprim (BACTRIM) 550 mg of trimethoprim in dextrose 5 % 1,000  mL IVPB  Status:  Discontinued        550 mg of trimethoprim 689.6 mL/hr over 90 Minutes Intravenous Every 6 hours 10/13/22 0945 10/15/22 0937   10/13/22 1000  doxycycline (VIBRA-TABS) tablet 100 mg  Status:  Discontinued        100 mg Per Tube Every 12 hours 10/13/22 0731 10/13/22 0954   10/12/22 1600  piperacillin-tazobactam (ZOSYN) IVPB 3.375 g        3.375 g 12.5 mL/hr over 240 Minutes Intravenous Every 8 hours 10/12/22 1516 10/18/22 2030   10/11/22 1230  ceFEPIme (MAXIPIME) 2 g in sodium chloride 0.9 % 100 mL IVPB  Status:  Discontinued        2 g 200 mL/hr over 30 Minutes Intravenous Every 12 hours 10/11/22 1158 10/12/22 1508   10/11/22 1000  doxycycline (VIBRA-TABS) tablet 100 mg  Status:   Discontinued        100 mg Oral Daily 10/10/22 0117 10/10/22 0126   10/11/22 0000  ceFEPIme (MAXIPIME) 2 g in sodium chloride 0.9 % 100 mL IVPB  Status:  Discontinued        2 g 200 mL/hr over 30 Minutes Intravenous Every 24 hours 10/10/22 0122 10/10/22 0123   10/11/22 0000  ceFEPIme (MAXIPIME) 1 g in sodium chloride 0.9 % 100 mL IVPB  Status:  Discontinued        1 g 200 mL/hr over 30 Minutes Intravenous Every 24 hours 10/10/22 0123 10/11/22 1158   10/10/22 0215  doxycycline (VIBRA-TABS) tablet 100 mg  Status:  Discontinued        100 mg Oral Every 12 hours 10/10/22 0126 10/13/22 0730   10/09/22 2315  ceFEPIme (MAXIPIME) 2 g in sodium chloride 0.9 % 100 mL IVPB        2 g 200 mL/hr over 30 Minutes Intravenous  Once 10/09/22 2303 10/10/22 0135   10/09/22 2130  doxycycline (VIBRAMYCIN) 100 mg in sodium chloride 0.9 % 250 mL IVPB        100 mg 125 mL/hr over 120 Minutes Intravenous  Once 10/09/22 2115 10/10/22 0011   10/09/22 2053  ceFEPIme (MAXIPIME) 1 g in sodium chloride 0.9 % 100 mL IVPB  Status:  Discontinued        1 g 200 mL/hr over 30 Minutes Intravenous Every 24 hours 10/09/22 2054 10/09/22 2303   10/09/22 2045  ceFEPIme (MAXIPIME) 2 g in sodium chloride 0.9 % 100 mL IVPB  Status:  Discontinued        2 g 200 mL/hr over 30 Minutes Intravenous Every 24 hours 10/09/22 2040 10/09/22 2054       Subjective: No complaints Mom, fiance, son at bedside - she's not sure she wants to go to inpatient rehab  Objective: Vitals:   11/02/22 0444 11/02/22 0830 11/02/22 0839 11/02/22 1430  BP: (!) 177/90 (!) 163/86  (!) 146/84  Pulse: 95 97 88 79  Resp: 18 18 18 17   Temp: 98.3 F (36.8 C) 98.4 F (36.9 C)  98.6 F (37 C)  TempSrc: Oral Oral  Oral  SpO2: 100% 100% 99% 96%  Weight:      Height:        Intake/Output Summary (Last 24 hours) at 11/02/2022 2009 Last data filed at 11/02/2022 1506 Gross per 24 hour  Intake 1350 ml  Output 500 ml  Net 850 ml   Filed Weights   10/28/22  0500 10/29/22 0408 10/31/22 0709  Weight: 117.5 kg 117.9 kg 116.8 kg  Examination:  General: No acute distress. Cardiovascular: RRR Lungs: unlabored Abdomen: Soft, nontender, nondistended  Neurological: Alert and oriented 3. Moves all extremities 4 with equal strength. Cranial nerves II through XII grossly intact. Extremities: 1+ LE edema     Data Reviewed: I have personally reviewed following labs and imaging studies  CBC: Recent Labs  Lab 10/27/22 0512 10/28/22 0400 10/29/22 0004 11/02/22 0036  WBC 8.4 7.2 8.1 5.5  NEUTROABS 6.9 5.7 6.3  --   HGB 8.1* 8.1* 8.3* 10.4*  HCT 26.5* 27.1* 27.9* 34.8*  MCV 85.2 84.4 85.3 88.1  PLT 282 283 270 211    Basic Metabolic Panel: Recent Labs  Lab 10/27/22 0512 10/27/22 1506 10/28/22 0400 10/29/22 0004 10/30/22 0146 10/31/22 0838 11/01/22 0224 11/02/22 0036 11/02/22 0430  NA 142   < > 140 141 136 137 138  --  138  K 3.0*   < > 4.3 4.1 3.6 4.1 3.6  --  3.8  CL 104   < > 105 109 104 105 103  --  104  CO2 26   < > 24 22 21* 18* 20*  --  18*  GLUCOSE 188*   < > 280* 205* 130* 300* 173*  --  242*  BUN 36*   < > 28* 22* 22* 12 11  --  10  CREATININE 0.80   < > 0.78 0.67 0.74 0.80 0.71  --  0.85  CALCIUM 8.3*   < > 8.2* 8.6* 8.4* 8.6* 8.7*  --  8.7*  MG  --   --  1.3*  --   --   --   --   --  1.3*  PHOS 4.1  --  2.9 3.5 4.1  --   --  4.5  --    < > = values in this interval not displayed.    GFR: Estimated Creatinine Clearance: 107.4 mL/min (by C-G formula based on SCr of 0.85 mg/dL).  Liver Function Tests: Recent Labs  Lab 10/27/22 0512 10/28/22 0400 10/29/22 0004 10/30/22 0146  ALBUMIN 2.0* 2.1* 2.3* 2.3*    CBG: Recent Labs  Lab 11/01/22 1649 11/01/22 2153 11/02/22 0745 11/02/22 1136 11/02/22 1647  GLUCAP 138* 163* 230* 155* 154*     Recent Results (from the past 240 hour(s))  Culture, blood (Routine X 2) w Reflex to ID Panel     Status: None   Collection Time: 10/26/22  8:44 AM   Specimen:  BLOOD RIGHT HAND  Result Value Ref Range Status   Specimen Description BLOOD RIGHT HAND  Final   Special Requests   Final    BOTTLES DRAWN AEROBIC AND ANAEROBIC Blood Culture adequate volume   Culture   Final    NO GROWTH 5 DAYS Performed at [facility], [address] [place], [place] [address]    Report Status 10/31/2022 FINAL  Final  Culture, blood (Routine X 2) w Reflex to ID Panel     Status: None   Collection Time: 10/26/22  8:50 AM   Specimen: BLOOD RIGHT HAND  Result Value Ref Range Status   Specimen Description BLOOD RIGHT HAND  Final   Special Requests   Final    BOTTLES DRAWN AEROBIC AND ANAEROBIC Blood Culture adequate volume   Culture   Final    NO GROWTH 5 DAYS Performed at [facility], [address] [place], [place] [address]    Report Status 10/31/2022 FINAL  Final  Radiology Studies: No results found.      Scheduled Meds:  amLODipine  10 mg Oral Daily   arformoterol  15 mcg Nebulization BID   ARIPiprazole  30 mg Oral Daily   atorvastatin  10 mg Oral Daily   buPROPion  225 mg Oral BID   cloNIDine  0.3 mg Oral TID   folic acid  1 mg Oral Daily   furosemide  60 mg Oral BID   guaiFENesin  400 mg Oral Q4H   heparin  5,000 Units Subcutaneous Q8H   hydrALAZINE  100 mg Oral Q8H   hydrOXYzine  25 mg Oral QHS   insulin aspart  0-20 Units Subcutaneous TID WC   insulin aspart  4 Units Subcutaneous TID WC   insulin detemir  9 Units Subcutaneous BID   labetalol  400 mg Oral Q8H   losartan  100 mg Oral Daily   montelukast  10 mg Oral QHS   multivitamin with minerals  1 tablet Oral Daily   mycophenolate  720 mg Oral BID   mouth rinse  15 mL Mouth Rinse 4 times per day   prazosin  1 mg Oral QHS   predniSONE  5 mg Oral Q breakfast   revefenacin  175 mcg Nebulization Daily   spironolactone  50 mg Oral Daily   sulfamethoxazole-trimethoprim  1 tablet Oral Once per day on Monday Wednesday Friday   tacrolimus  4 mg Oral BID    Continuous Infusions:  sodium chloride Stopped (10/28/22 0826)     LOS: 23 days    Time spent: oer 30 min    Lacretia Nicks, MD [facility]   To contact the attending provider between 7A-7P or the covering provider during after hours 7P-7A, please log into the web site [email or web address] and access using universal [place] password for that web site. If you do not have the password, please call the hospital operator.  11/02/2022, 8:09 PM

## 2022-11-02 NOTE — Progress Notes (Signed)
Occupational Therapy Treatment Patient Details Name: Linda Ellison MRN: [id number] DOB: Sep 12, 1975 Today's Date: 11/02/2022   History of present illness 47 y.o. female presents to [facility] hospital on 10/09/2022 with CAP. Pt transferred to ICU on 7/9, intubated 7/10. HDU initiated on 7/15 due to fluid overload. Bronch 7/18. Extubated 10/26/2022. PMH includes renal transplant, DMII, HTN, HLD, OSA, CAD, hypothyroidism, gastroparesis, anxiety, depression.   OT comments  Patient has demonstrated good progress with OT treatment meeting bed mobility and toilet transfers goals. Patient able to transfer from EOB to recliner with min/mod assist from requiring Stedy to assist. Patient able to stand at sink for grooming with min guard assist and tolerating 3 minutes of standing. Patient performed mobility in room with RW and min assist with patient able to ambulate 12 feet before requiring seated rest break. Patient will benefit from intensive inpatient follow up therapy, >3 hours/day to increase independence and safety with self care and transfers. Patient states she would rather go home with family and may progress to home with HHOT/PT. Acute OT to continue to follow.    Recommendations for follow up therapy are one component of a multi-disciplinary discharge planning process, led by the attending physician.  Recommendations may be updated based on patient status, additional functional criteria and insurance authorization.    Assistance Recommended at Discharge Frequent or constant Supervision/Assistance  Patient can return home with the following  Two people to help with walking and/or transfers;Two people to help with bathing/dressing/bathroom;Assistance with cooking/housework;Help with stairs or ramp for entrance;Assist for transportation;Assistance with feeding   Equipment Recommendations  Other (comment) (defer)    Recommendations for Other Services      Precautions / Restrictions Precautions Precautions:  Fall Precaution Comments: LUE AV fistula Restrictions Weight Bearing Restrictions: No       Mobility Bed Mobility Overal bed mobility: Needs Assistance Bed Mobility: Rolling, Sidelying to Sit Rolling: Modified independent (Device/Increase time) Sidelying to sit: Supervision, HOB elevated       General bed mobility comments: no physical assistance needed for bed mobility, cues for rail use    Transfers Overall transfer level: Needs assistance Equipment used: Rolling walker (2 wheels) Transfers: Sit to/from Stand, Bed to chair/wheelchair/BSC Sit to Stand: Min assist, Mod assist, From elevated surface     Step pivot transfers: Min assist     General transfer comment: min/mod assist initially and progressed to min assist for sit to stand and transfers     Balance Overall balance assessment: Needs assistance Sitting-balance support: Feet supported, Bilateral upper extremity supported Sitting balance-Leahy Scale: Fair Sitting balance - Comments: supervison   Standing balance support: Single extremity supported, Bilateral upper extremity supported, During functional activity Standing balance-Leahy Scale: Poor Standing balance comment: able to stand at sink for grooming tasks with one extremity support                           ADL either performed or assessed with clinical judgement   ADL Overall ADL's : Needs assistance/impaired Eating/Feeding: Set up   Grooming: Min guard;Standing;Wash/dry hands;Wash/dry face;Oral care Grooming Details (indicate cue type and reason): stood at sink for 3 minutes with min guard assist         Upper Body Dressing : Minimal assistance;Sitting Upper Body Dressing Details (indicate cue type and reason): donn gown to cover back Lower Body Dressing: Maximal assistance Lower Body Dressing Details (indicate cue type and reason): patient unable to reach feet for donning socks Toilet  Transfer: Minimal assistance;Rolling walker (2  wheels) Toilet Transfer Details (indicate cue type and reason): simulated to recliner                Extremity/Trunk Assessment              Vision       Perception     Praxis      Cognition Arousal/Alertness: Awake/alert Behavior During Therapy: WFL for tasks assessed/performed Overall Cognitive Status: Within Functional Limits for tasks assessed                                 General Comments: follows commands, motivated towards progress        Exercises      Shoulder Instructions       General Comments VSS on RA    Pertinent Vitals/ Pain       Pain Assessment Pain Assessment: Faces Faces Pain Scale: Hurts a little bit Pain Location: generalized with mobility Pain Descriptors / Indicators: Grimacing Pain Intervention(s): Limited activity within patient's tolerance, Monitored during session, Repositioned  Home Living                                          Prior Functioning/Environment              Frequency  Min 2X/week        Progress Toward Goals  OT Goals(current goals can now be found in the care plan section)  Progress towards OT goals: Progressing toward goals  Acute Rehab OT Goals Patient Stated Goal: go home OT Goal Formulation: With patient Time For Goal Achievement: 11/11/22 Potential to Achieve Goals: Good ADL Goals Pt Will Perform Grooming: with set-up;sitting Pt Will Perform Upper Body Bathing: with min guard assist;sitting Pt Will Perform Lower Body Bathing: with mod assist;sitting/lateral leans;sit to/from stand Pt Will Transfer to Toilet: with mod assist;bedside commode;stand pivot transfer Additional ADL Goal #1: Pt will perform bed mobility with min guard A as precursor to ADL  Plan Discharge plan remains appropriate    Co-evaluation                 AM-PAC OT "6 Clicks" Daily Activity     Outcome Measure   Help from another person eating meals?: A Little Help from  another person taking care of personal grooming?: A Little Help from another person toileting, which includes using toliet, bedpan, or urinal?: A Lot Help from another person bathing (including washing, rinsing, drying)?: A Lot Help from another person to put on and taking off regular upper body clothing?: A Lot Help from another person to put on and taking off regular lower body clothing?: A Lot 6 Click Score: 14    End of Session Equipment Utilized During Treatment: Gait belt;Rolling walker (2 wheels)  OT Visit Diagnosis: Unsteadiness on feet (R26.81);Muscle weakness (generalized) (M62.81)   Activity Tolerance Patient tolerated treatment well   Patient Left in chair;with call bell/phone within reach   Nurse Communication Mobility status        Time: 4098-1191 OT Time Calculation (min): 30 min  Charges: OT General Charges $OT Visit: 1 Visit OT Treatments $Self Care/Home Management : 8-22 mins $Therapeutic Activity: 8-22 mins  Alfonse Flavors, OTA Acute Rehabilitation Services  Office [phone]   Dewain Penning 11/02/2022, 1:03 PM

## 2022-11-02 NOTE — Plan of Care (Signed)
  Problem: Fluid Volume: Goal: Ability to maintain a balanced intake and output will improve Outcome: Progressing   Problem: Health Behavior/Discharge Planning: Goal: Ability to manage health-related needs will improve Outcome: Progressing   Problem: Metabolic: Goal: Ability to maintain appropriate glucose levels will improve Outcome: Progressing   Problem: Nutritional: Goal: Maintenance of adequate nutrition will improve Outcome: Progressing Goal: Progress toward achieving an optimal weight will improve Outcome: Progressing   Problem: Skin Integrity: Goal: Risk for impaired skin integrity will decrease Outcome: Progressing   Problem: Tissue Perfusion: Goal: Adequacy of tissue perfusion will improve Outcome: Progressing   Problem: Education: Goal: Knowledge of General Education information will improve Description: Including pain rating scale, medication(s)/side effects and non-pharmacologic comfort measures Outcome: Progressing   Problem: Health Behavior/Discharge Planning: Goal: Ability to manage health-related needs will improve Outcome: Progressing   Problem: Clinical Measurements: Goal: Ability to maintain clinical measurements within normal limits will improve Outcome: Progressing Goal: Will remain free from infection Outcome: Progressing Goal: Diagnostic test results will improve Outcome: Progressing Goal: Respiratory complications will improve Outcome: Progressing Goal: Cardiovascular complication will be avoided Outcome: Progressing   Problem: Nutrition: Goal: Adequate nutrition will be maintained Outcome: Progressing   Problem: Coping: Goal: Level of anxiety will decrease Outcome: Progressing   Problem: Elimination: Goal: Will not experience complications related to bowel motility Outcome: Progressing Goal: Will not experience complications related to urinary retention Outcome: Progressing   Problem: Pain Managment: Goal: General experience of  comfort will improve Outcome: Progressing   Problem: Safety: Goal: Ability to remain free from injury will improve Outcome: Progressing   Problem: Skin Integrity: Goal: Risk for impaired skin integrity will decrease Outcome: Progressing   Problem: Cardiac: Goal: Ability to maintain an adequate cardiac output will improve Outcome: Progressing   Problem: Fluid Volume: Goal: Ability to achieve a balanced intake and output will improve Outcome: Progressing   Problem: Metabolic: Goal: Ability to maintain appropriate glucose levels will improve Outcome: Progressing   Problem: Nutritional: Goal: Maintenance of adequate nutrition will improve Outcome: Progressing Goal: Maintenance of adequate weight for body size and type will improve Outcome: Progressing   Problem: Respiratory: Goal: Will regain and/or maintain adequate ventilation Outcome: Progressing   Problem: Urinary Elimination: Goal: Ability to achieve and maintain adequate renal perfusion and functioning will improve Outcome: Progressing   Problem: Activity: Goal: Ability to tolerate increased activity will improve Outcome: Progressing   Problem: Respiratory: Goal: Ability to maintain a clear airway and adequate ventilation will improve Outcome: Progressing

## 2022-11-03 DIAGNOSIS — J189 Pneumonia, unspecified organism: Secondary | ICD-10-CM | POA: Diagnosis not present

## 2022-11-03 LAB — GLUCOSE, CAPILLARY
Glucose-Capillary: 190 mg/dL — ABNORMAL HIGH (ref 70–99)
Glucose-Capillary: 211 mg/dL — ABNORMAL HIGH (ref 70–99)
Glucose-Capillary: 182 mg/dL — ABNORMAL HIGH (ref 70–99)
Glucose-Capillary: 186 mg/dL — ABNORMAL HIGH (ref 70–99)
Glucose-Capillary: 207 mg/dL — ABNORMAL HIGH (ref 70–99)

## 2022-11-03 MED ORDER — CLONIDINE HCL 0.2 MG PO TABS
0.2000 mg | ORAL_TABLET | Freq: Two times a day (BID) | ORAL | Status: DC
  Administered 2022-11-04: 0.2 mg via ORAL
  Filled 2022-11-03: qty 1

## 2022-11-03 MED ORDER — SODIUM CHLORIDE 0.9 % IV BOLUS
250.0000 mL | Freq: Once | INTRAVENOUS | Status: AC
  Administered 2022-11-03: 250 mL via INTRAVENOUS

## 2022-11-03 MED ORDER — SODIUM CHLORIDE 0.9 % IV BOLUS: 500.0000 mL | Freq: Once | INTRAVENOUS | Status: DC

## 2022-11-03 MED ORDER — ACETAMINOPHEN 325 MG PO TABS: 650.0000 mg | ORAL_TABLET | Freq: Four times a day (QID) | ORAL | Status: DC | PRN

## 2022-11-03 MED ORDER — GUAIFENESIN 100 MG/5ML PO LIQD
5.0000 mL | ORAL | Status: DC | PRN
  Administered 2022-11-04 – 2022-11-06 (×5): 5 mL via ORAL
  Filled 2022-11-03 (×5): qty 15

## 2022-11-03 MED ORDER — MAGNESIUM SULFATE 2 GM/50ML IV SOLN
2.0000 g | Freq: Once | INTRAVENOUS | Status: AC
  Administered 2022-11-03: 2 g via INTRAVENOUS
  Filled 2022-11-03: qty 50

## 2022-11-03 MED ORDER — PANTOPRAZOLE SODIUM 40 MG PO TBEC
40.0000 mg | DELAYED_RELEASE_TABLET | Freq: Every day | ORAL | Status: DC
  Administered 2022-11-03 – 2022-11-13 (×11): 40 mg via ORAL
  Filled 2022-11-03 (×11): qty 1

## 2022-11-03 MED ORDER — ISOSORB DINITRATE-HYDRALAZINE 20-37.5 MG PO TABS
1.0000 | ORAL_TABLET | Freq: Three times a day (TID) | ORAL | Status: DC
  Administered 2022-11-03: 1 via ORAL
  Filled 2022-11-03 (×2): qty 1

## 2022-11-03 MED ORDER — PROCHLORPERAZINE EDISYLATE 10 MG/2ML IJ SOLN
5.0000 mg | INTRAMUSCULAR | Status: DC | PRN
  Administered 2022-11-03 – 2022-11-12 (×3): 5 mg via INTRAVENOUS
  Filled 2022-11-03 (×3): qty 2

## 2022-11-03 NOTE — Plan of Care (Signed)
  Problem: Pain Managment: Goal: General experience of comfort will improve Outcome: Progressing   Problem: Safety: Goal: Ability to remain free from injury will improve Outcome: Progressing   Problem: Skin Integrity: Goal: Risk for impaired skin integrity will decrease Outcome: Progressing   

## 2022-11-03 NOTE — Progress Notes (Signed)
PT Cancellation Note  Patient Details Name: Linda Ellison MRN: [id number] DOB: Jan 24, 1976   Cancelled Treatment:    Reason Eval/Treat Not Completed: (P) Other (comment) (pt requesting a bath prior to therapies.) Will continue efforts per PT plan of care as schedule permits.    Cerinity Zynda M Bhavik Cabiness 11/03/2022, 2:50 PM

## 2022-11-03 NOTE — Progress Notes (Signed)
Linda Ellison  ZOX:[id number] DOB: 18-Feb-1976 DOA: 10/09/2022 PCP: Daylene Katayama, PA    Brief Narrative:  47 year old with a history of renal transplant who was admitted to the hospital 7/6 with a multifocal pneumonia and ultimately required mechanical ventilation.  Her hospitalization was complicated by acute renal failure which transiently required dialysis, but her renal function subsequently improved.  She completed antibiotics for her pneumonia, was successfully extubated, and weaned to room air as of 7/26.  She was transferred to the hospitalist service 7/26.  Significant Events: 7/6 admission 7/9 transferred to ICU 7/10 intubated 7/15 worsening renal function with fluid overload -hemodialysis initiated 7/17 endotracheal tube exchange due to mucous plugging 7/18 emergent bronchoscopy for mucous plugging 7/19 Cleviprex required due to severe hypertension -repeat episode of mucous plugging 7/22 weaned on pressure support with copious secretions noted 7/23 extubated but required BiPAP 7/24 Cleviprex drip resumed 7/26 transferred to [facility] service -awaiting rehab placement  Goals of Care:   Code Status: Prior   DVT prophylaxis: heparin injection 5,000 Units Start: 10/10/22 0600 SCDs Start: 10/10/22 0105  Interim Hx: The patient had an episode of severe nausea with vomiting last night.  She was transiently hypoxic during the spell.  Her oxygen saturations are presently 97% but she is requiring 4 L of nasal cannula support.  Blood pressure remains elevated with systolics up to the 177 range.  She is resting company in the bed at the time of my visit.  She tells me she has been up showering on her own and taking care of her own ADLs.  She has no new complaints today.  She feels she is much improved and has not had a recurrence of her symptoms of dyspnea since last night.  Assessment & Plan:  Acute hypoxic respiratory failure due to ARDS complicated by frequent mucous plugging Continue  aggressive pulmonary hygiene -attempt to wean back to room air today -clinically stable at time of my exam today  Aspiration pneumonitis Occurred during the night 7/30-7/31 -attempt to wean back to room air today -appears to have been a self-limited minor event  Multifocal pneumonia in immunosuppressed patient Has completed a course of antibiotic therapy -no clinical signs of active infection at present  Acute renal failure of a transplanted kidney Fortunately this has resolved with the patient's renal function recovering -she did require dialysis during this hospital stay -continue her usual immunosuppressive medications  Resistant hypertension Elevated blood pressure continues to be an issue -adjust medical therapy again and follow  Hypomagnesemia Continue to supplement  DM2 Follow without change today  Septic shock -resolved Due to above  Acute metabolic encephalopathy due to multifocal pneumonia - resolved  Generalized weakness/deconditioning The recommendation by therapy has been for inpatient rehab but the patient is out of network for our in-house rehab unit -the possibility of inpatient rehab in [facility] was investigated but the patient informed me today that she is not interested in this -she feels she has improved to the point that she can attend to her needs at home with help from her family -preliminary plan is for discharge home 8/1   Family Communication: No family present at time of exam today Disposition: Anticipate discharge home 8/1   Objective: Blood pressure (!) 144/86, pulse 90, temperature 98.1 F (36.7 C), temperature source Oral, resp. rate 18, height 5\' 6"  (1.676 m), weight 104.3 kg, SpO2 97%.  Intake/Output Summary (Last 24 hours) at 11/03/2022 0902 Last data filed at 11/03/2022 0826 Gross per 24 hour  Intake 1470 ml  Output 150 ml  Net 1320 ml   Filed Weights   10/29/22 0408 10/31/22 0709 11/03/22 0826  Weight: 117.9 kg 116.8 kg 104.3 kg     Examination: General: No acute respiratory distress Lungs: Faint/mild bibasilar crackles with no wheezing Cardiovascular: Regular rate and rhythm without murmur gallop or rub normal S1 and S2 Abdomen: Nontender, nondistended, soft, bowel sounds positive, no rebound, no ascites, no appreciable mass Extremities: No significant cyanosis, clubbing, or edema bilateral lower extremities  CBC: Recent Labs  Lab 10/28/22 0400 10/29/22 0004 11/02/22 0036 11/03/22 0319  WBC 7.2 8.1 5.5 5.1  NEUTROABS 5.7 6.3  --  2.9  HGB 8.1* 8.3* 10.4* 9.2*  HCT 27.1* 27.9* 34.8* 29.6*  MCV 84.4 85.3 88.1 84.8  PLT 283 270 211 262   Basic Metabolic Panel: Recent Labs  Lab 10/28/22 0400 10/29/22 0004 10/30/22 0146 10/31/22 0838 11/01/22 0224 11/02/22 0036 11/02/22 0430 11/03/22 0319  NA 140   < > 136   < > 138  --  138 135  K 4.3   < > 3.6   < > 3.6  --  3.8 3.6  CL 105   < > 104   < > 103  --  104 104  CO2 24   < > 21*   < > 20*  --  18* 19*  GLUCOSE 280*   < > 130*   < > 173*  --  242* 176*  BUN 28*   < > 22*   < > 11  --  10 9  CREATININE 0.78   < > 0.74   < > 0.71  --  0.85 0.74  CALCIUM 8.2*   < > 8.4*   < > 8.7*  --  8.7* 8.7*  MG 1.3*  --   --   --   --   --  1.3* 1.6*  PHOS 2.9   < > 4.1  --   --  4.5  --  4.5   < > = values in this interval not displayed.   GFR: Estimated Creatinine Clearance: 107.2 mL/min (by C-G formula based on SCr of 0.74 mg/dL).   Scheduled Meds:  amLODipine  10 mg Oral Daily   arformoterol  15 mcg Nebulization BID   ARIPiprazole  30 mg Oral Daily   atorvastatin  10 mg Oral Daily   buPROPion  225 mg Oral BID   cloNIDine  0.3 mg Oral TID   folic acid  1 mg Oral Daily   furosemide  60 mg Oral BID   guaiFENesin  400 mg Oral Q4H   heparin  5,000 Units Subcutaneous Q8H   hydrALAZINE  100 mg Oral Q8H   hydrOXYzine  25 mg Oral QHS   insulin aspart  0-20 Units Subcutaneous TID WC   insulin aspart  4 Units Subcutaneous TID WC   insulin detemir  9 Units  Subcutaneous BID   labetalol  400 mg Oral Q8H   losartan  100 mg Oral Daily   montelukast  10 mg Oral QHS   multivitamin with minerals  1 tablet Oral Daily   mycophenolate  720 mg Oral BID   mouth rinse  15 mL Mouth Rinse 4 times per day   prazosin  1 mg Oral QHS   predniSONE  5 mg Oral Q breakfast   revefenacin  175 mcg Nebulization Daily   spironolactone  50 mg Oral Daily   sulfamethoxazole-trimethoprim  1 tablet Oral Once  per day on Monday Wednesday Friday   tacrolimus  4 mg Oral BID   Continuous Infusions:  sodium chloride Stopped (10/28/22 0826)     LOS: 24 days   Lonia Blood, MD [facility] Office  [phone] Pager - Text Page per Amion  If 7PM-7AM, please contact night-coverage per Amion 11/03/2022, 9:02 AM

## 2022-11-03 NOTE — Progress Notes (Signed)
Pt. Mother in law come out of the room asked for breathing treatment because she is struggling to breath.As the nurse inters the room pt,was gasping and  none responding.Rapid respond and vital taken. B/P 177/95,HR 113 and O2 93%.Saturation was around 89%,gave oxygen [place] on 4L.Pt. started to Vomit yellowish green emeses and started to respond. No medication given and waiting MD come to Pt.

## 2022-11-03 NOTE — Progress Notes (Addendum)
Physical Therapy Treatment Patient Details Name: Linda Ellison MRN: [id number] DOB: September 05, 1975 Today's Date: 11/03/2022   History of Present Illness 47 y.o. female presents to [facility] hospital on 10/09/2022 with CAP. Pt transferred to ICU on 7/9, intubated 7/10. HDU initiated on 7/15 due to fluid overload. Bronch 7/18. Extubated 10/26/2022. PMH includes renal transplant, DMII, HTN, HLD, OSA, CAD, hypothyroidism, gastroparesis, anxiety, depression.    PT Comments  Pt received in supine, agreeable to therapy session with encouragement, pt limited due to symptomatic orthostatic hypotension this session but with good participation as able in transfer training. Discussed use of IS with pt given her increased WOB with exertion (DOE 2-3/4 with step pivot transfer/transfer to EOB) and RN/MD notified she may benefit from BLE compression for improved hemodynamic stability. Pt agreeable to sit up in recliner at end of session for improved pulmonary clearance and strengthening. Defer gait trial today given severe orthostatic hypotension, plan to work on gait/exercises more next session if BP more stable. Pt weaned to RA and SpO2 >93% on RA throughout session. Increased time/effort to initiate and perform all tasks today. Pt continues to benefit from PT services to progress toward functional mobility goals.     11/03/22 1600  Orthostatic Sitting  BP- Sitting 120/69  Pulse- Sitting 84  Orthostatic Standing at 0 minutes  BP- Standing at 0 minutes (!) 60/37  Pulse- Standing at 0 minutes 92   Orthostatic Sitting  BP- Sitting (recliner) 125/76  Pulse- Sitting 88     If plan is discharge home, recommend the following: Two people to help with walking and/or transfers;Two people to help with bathing/dressing/bathroom;Assistance with cooking/housework;Direct supervision/assist for medications management;Assist for transportation;Help with stairs or ramp for entrance   Can travel by private vehicle        Equipment  Recommendations  Other (comment) (TBD)    Recommendations for Other Services       Precautions / Restrictions Precautions Precautions: Fall Precaution Comments: LUE AV fistula, watch BP Restrictions Weight Bearing Restrictions: No     Mobility  Bed Mobility Overal bed mobility: Needs Assistance Bed Mobility: Rolling, Sidelying to Sit Rolling: Modified independent (Device/Increase time) Sidelying to sit: Supervision, HOB elevated       General bed mobility comments: no physical assistance needed for bed mobility.    Transfers Overall transfer level: Needs assistance Equipment used: Rolling walker (2 wheels) Transfers: Sit to/from Stand, Bed to chair/wheelchair/BSC Sit to Stand: Min assist, From elevated surface   Step pivot transfers: Min assist       General transfer comment: from elevated bed surface per pt request, to chair with RW, then from chair<>RW, pt orthostatic    Ambulation/Gait               General Gait Details: Defer, pt with severe BP drop in standing with orthostatic VS assessment, see comments.   Stairs             Wheelchair Mobility     Tilt Bed    Modified Rankin (Stroke Patients Only)       Balance Overall balance assessment: Needs assistance Sitting-balance support: Feet supported, Bilateral upper extremity supported Sitting balance-Leahy Scale: Fair Sitting balance - Comments: Pt with increased WOB but fair to good static seated balance   Standing balance support: Single extremity supported, Bilateral upper extremity supported, During functional activity Standing balance-Leahy Scale: Poor Standing balance comment: RW reliant, dizzy  Cognition Arousal/Alertness: Awake/alert Behavior During Therapy: WFL for tasks assessed/performed, Flat affect Overall Cognitive Status: Within Functional Limits for tasks assessed                                 General Comments:  Follows commands, some slower processing today.        Exercises Other Exercises Other Exercises: IS x 5 reps, pt achieves ~1750    General Comments General comments (skin integrity, edema, etc.): see comments above      Pertinent Vitals/Pain Pain Assessment Pain Assessment: No/denies pain Pain Intervention(s): Monitored during session, Repositioned    Home Living                          Prior Function            PT Goals (current goals can now be found in the care plan section) Acute Rehab PT Goals PT Goal Formulation: With patient Time For Goal Achievement: 11/10/22 Progress towards PT goals: Progressing toward goals (soft BP limiting standing tolerance)    Frequency    Min 1X/week      PT Plan Current plan remains appropriate    Co-evaluation              AM-PAC PT "6 Clicks" Mobility   Outcome Measure  Help needed turning from your back to your side while in a flat bed without using bedrails?: None Help needed moving from lying on your back to sitting on the side of a flat bed without using bedrails?: A Little Help needed moving to and from a bed to a chair (including a wheelchair)?: A Little Help needed standing up from a chair using your arms (e.g., wheelchair or bedside chair)?: A Little Help needed to walk in hospital room?: Total Help needed climbing 3-5 steps with a railing? : Total 6 Click Score: 15    End of Session Equipment Utilized During Treatment: Gait belt (initially on 2L O2 [place] but weaned to RA during session) Activity Tolerance: Treatment limited secondary to medical complications (Comment);Other (comment);Patient limited by fatigue (symptomatic orthostatic hypotension) Patient left: in chair;with call bell/phone within reach;with family/visitor present (mother in law present in room) Nurse Communication: Mobility status;Other (comment) (symptomatic orthostatic hypotension) PT Visit Diagnosis: Other abnormalities of gait  and mobility (R26.89);Muscle weakness (generalized) (M62.81);Difficulty in walking, not elsewhere classified (R26.2)     Time: 5573-2202 PT Time Calculation (min) (ACUTE ONLY): 38 min  Charges:    $Therapeutic Activity: 38-52 mins PT General Charges $$ ACUTE PT VISIT: 1 Visit                     Yevette Knust P., PTA Acute Rehabilitation Services Secure Chat Preferred 9a-5:30pm Office: [phone]    Dorathy Kinsman [facility] 11/03/2022, 5:07 PM

## 2022-11-03 NOTE — Progress Notes (Signed)
I was notified regarding patient was difficult to arouse, not responding to stimulation. Upon arrival, pt was sitting in upright position in bed. Alert, following commands and answering simple questions. She was nauseated and vomited once a small amount of emesis approximately 50 cc. Skin is warm, pink and diaphoretic. Pt denies CP, SOB but endorses nausea. She nodded yes when asked if she felt a little better after vomiting. Pt is requiring 4LNC now, previously on RA. CBG 190.   0630-98.5F, HR 93, 177/95, RR 20 with sats 93% on 4LNC

## 2022-11-04 DIAGNOSIS — J189 Pneumonia, unspecified organism: Secondary | ICD-10-CM | POA: Diagnosis not present

## 2022-11-04 LAB — GLUCOSE, CAPILLARY
Glucose-Capillary: 202 mg/dL — ABNORMAL HIGH (ref 70–99)
Glucose-Capillary: 161 mg/dL — ABNORMAL HIGH (ref 70–99)
Glucose-Capillary: 128 mg/dL — ABNORMAL HIGH (ref 70–99)
Glucose-Capillary: 112 mg/dL — ABNORMAL HIGH (ref 70–99)

## 2022-11-04 MED ORDER — LABETALOL HCL 200 MG PO TABS
300.0000 mg | ORAL_TABLET | Freq: Two times a day (BID) | ORAL | Status: DC
  Administered 2022-11-04 – 2022-11-13 (×18): 300 mg via ORAL
  Filled 2022-11-04 (×18): qty 2

## 2022-11-04 MED ORDER — HYDRALAZINE HCL 20 MG/ML IJ SOLN: 10.0000 mg | INTRAMUSCULAR | Status: DC | PRN

## 2022-11-04 MED ORDER — CLONIDINE HCL 0.1 MG PO TABS: 0.1000 mg | ORAL_TABLET | Freq: Two times a day (BID) | ORAL | Status: DC

## 2022-11-04 NOTE — Plan of Care (Signed)
  Problem: Nutritional: Goal: Maintenance of adequate nutrition will improve Outcome: Progressing   Problem: Nutrition: Goal: Adequate nutrition will be maintained Outcome: Progressing   Problem: Coping: Goal: Level of anxiety will decrease Outcome: Progressing   Problem: Elimination: Goal: Will not experience complications related to bowel motility Outcome: Progressing Goal: Will not experience complications related to urinary retention Outcome: Progressing   Problem: Pain Managment: Goal: General experience of comfort will improve Outcome: Progressing

## 2022-11-04 NOTE — Progress Notes (Signed)
Physical Therapy Treatment Patient Details Name: Linda Ellison MRN: [id number] DOB: 04/07/1975 Today's Date: 11/04/2022   History of Present Illness 47 y.o. female presents to [facility] hospital on 10/09/2022 with CAP. Pt transferred to ICU on 7/9, intubated 7/10. HDU initiated on 7/15 due to fluid overload. Bronch 7/18. Extubated 10/26/2022. PMH includes renal transplant, DMII, HTN, HLD, OSA, CAD, hypothyroidism, gastroparesis, anxiety, depression.    PT Comments  Pt received in supine, more alert today and agreeable to work on transfer training, pt with compression socks donned prior to PTA arrival. She continues to demonstrate symptomatic orthostatic hypotension today, however BP drop from sit>stand somewhat less severe today with BLE compression and pt was able to stand ~3-4 mins prior to becoming more diaphoretic and c/o lightheadedness, needing to sit. Pt requiring up to minA for transfers/short gait task at bedside using RW and close chair follow for safety given BP instability. Pt continues to benefit from PT services to progress toward functional mobility goals, continue to recommend short term higher intensity post-acute therapies >3 hours/day as pt would benefit from closer monitoring and more frequent mobilization to return to prior level of function, if pt instead chooses to DC home recommend max [facility] services and may need wheelchair to progress mobility if BP remains soft in standing.     11/04/22 1140  Orthostatic Lying   BP- Lying 174/88  Pulse- Lying 89  Orthostatic Sitting  BP- Sitting 162/87  Pulse- Sitting 88  Orthostatic Standing at 0 minutes  BP- Standing at 0 minutes 123/68  Orthostatic Standing at 3 minutes  BP- Standing at 3 minutes 97/57  Pulse- Standing at 3 minutes 94     If plan is discharge home, recommend the following: Assistance with cooking/housework;Direct supervision/assist for medications management;Assist for transportation;Help with stairs or ramp for entrance;A lot  of help with walking and/or transfers;A lot of help with bathing/dressing/bathroom   Can travel by private vehicle        Equipment Recommendations  Other (comment) (TBD, if DC home instead of AIR, consider wheelchair due to orthostatic hypotension)    Recommendations for Other Services       Precautions / Restrictions Precautions Precautions: Fall Precaution Comments: LUE AV fistula, watch BP Restrictions Weight Bearing Restrictions: No     Mobility  Bed Mobility Overal bed mobility: Needs Assistance             General bed mobility comments: OOB in recliner    Transfers Overall transfer level: Needs assistance Equipment used: Rolling walker (2 wheels) Transfers: Sit to/from Stand Sit to Stand: Min assist   Step pivot transfers: Min guard       General transfer comment: from elevated height bed>RW and RW<>chair    Ambulation/Gait Ambulation/Gait assistance: Min Chemical engineer (Feet): 12 Feet Assistive device: Rolling walker (2 wheels) Gait Pattern/deviations: Step-to pattern, Decreased stride length       General Gait Details: Slow pace, heavy reliance on RW, pt diaphoretic at end of trial and needing minA for assist with RW/safety with backward stepping to chair, noted BP orthostatic despite TED hose so defer longer distance in room.   Stairs             Wheelchair Mobility     Tilt Bed    Modified Rankin (Stroke Patients Only)       Balance Overall balance assessment: Needs assistance Sitting-balance support: Feet supported, Bilateral upper extremity supported Sitting balance-Leahy Scale: Good Sitting balance - Comments: EOB   Standing balance  support: Bilateral upper extremity supported, During functional activity, Reliant on assistive device for balance Standing balance-Leahy Scale: Poor Standing balance comment: RW for dynamic tasks                            Cognition Arousal/Alertness: Awake/alert Behavior  During Therapy: WFL for tasks assessed/performed, Flat affect Overall Cognitive Status: Within Functional Limits for tasks assessed                                          Exercises Other Exercises Other Exercises: Encouraged IS use, COTA entering room to work on UE exercises as PTA exiting room    General Comments General comments (skin integrity, edema, etc.): SpO2 92% and greater on RA with exertional tasks, DOE to 2/4 with dynamic standing tasks, RR elevated as orthostatic hypotension symptoms increased; pt diaphoretic along with BP drop while standing      Pertinent Vitals/Pain Pain Assessment Pain Assessment: No/denies pain Faces Pain Scale: No hurt Pain Intervention(s): Monitored during session, Repositioned     PT Goals (current goals can now be found in the care plan section) Acute Rehab PT Goals Patient Stated Goal: regain independence, mobilize PT Goal Formulation: With patient Time For Goal Achievement: 11/10/22 Progress towards PT goals: Progressing toward goals    Frequency    Min 1X/week      PT Plan Current plan remains appropriate       AM-PAC PT "6 Clicks" Mobility   Outcome Measure  Help needed turning from your back to your side while in a flat bed without using bedrails?: None Help needed moving from lying on your back to sitting on the side of a flat bed without using bedrails?: A Little Help needed moving to and from a bed to a chair (including a wheelchair)?: A Little Help needed standing up from a chair using your arms (e.g., wheelchair or bedside chair)?: A Little Help needed to walk in hospital room?: Total Help needed climbing 3-5 steps with a railing? : Total 6 Click Score: 15    End of Session Equipment Utilized During Treatment: Gait belt Activity Tolerance: Patient tolerated treatment well;Treatment limited secondary to medical complications (Comment);Other (comment) (symptomatic orthostatic hypotension, symptoms  notable after 3 mins standing) Patient left: in chair;with call bell/phone within reach;with family/visitor present Nurse Communication: Mobility status;Other (comment) (symptomatic orthostatic hypotension) PT Visit Diagnosis: Other abnormalities of gait and mobility (R26.89);Muscle weakness (generalized) (M62.81);Difficulty in walking, not elsewhere classified (R26.2)     Time: 1610-9604 PT Time Calculation (min) (ACUTE ONLY): 22 min  Charges:    $Therapeutic Activity: 8-22 mins PT General Charges $$ ACUTE PT VISIT: 1 Visit                     Inioluwa Baris P., PTA Acute Rehabilitation Services Secure Chat Preferred 9a-5:30pm Office: [phone]    Dorathy Kinsman [facility] 11/04/2022, 2:00 PM

## 2022-11-04 NOTE — Progress Notes (Signed)
Linda Ellison  WUJ:[id number] DOB: 1976/01/16 DOA: 10/09/2022 PCP: Daylene Katayama, PA    Brief Narrative:  47 year old with a history of renal transplant who was admitted to the hospital 7/6 with a multifocal pneumonia and ultimately required mechanical ventilation.  Her hospitalization was complicated by acute renal failure which transiently required dialysis, but her renal function subsequently improved.  She completed antibiotics for her pneumonia, was successfully extubated, and weaned to room air as of 7/26.  She was transferred to the hospitalist service 7/26.  Significant Events: 7/6 admission 7/9 transferred to ICU 7/10 intubated 7/15 worsening renal function with fluid overload -hemodialysis initiated 7/17 endotracheal tube exchange due to mucous plugging 7/18 emergent bronchoscopy for mucous plugging 7/19 Cleviprex required due to severe hypertension -repeat episode of mucous plugging 7/22 weaned on pressure support with copious secretions noted 7/23 extubated but required BiPAP 7/24 Cleviprex drip resumed 7/26 transferred to [facility] service -awaiting rehab placement  Goals of Care:   Code Status: Prior   DVT prophylaxis: Place TED hose Start: 11/03/22 1629 heparin injection 5,000 Units Start: 10/10/22 0600 SCDs Start: 10/10/22 0105  Interim Hx: The patient experienced significant orthostatic hypotension yesterday with blood pressure 120 sitting and dropping to 60 when standing.  No other acute events have been reported overnight.  She is afebrile.  Systolic blood pressures presently in the 160 range.  Oxygen saturation is 98% on 2 L nasal cannula.  She has decided that she does not wish to be placed within a residential rehab facility and instead wants to be discharged straight home.  Assessment & Plan:  Acute hypoxic respiratory failure due to ARDS complicated by frequent mucous plugging Continue aggressive pulmonary hygiene -has been weaned back to room air   Aspiration  pneumonitis Occurred during the night 7/30-7/31 - appears to have been a self-limited minor event  Orthostatic hypotension Likely related to the addition of multiple new blood pressure medications during this hospital stay, to include BiDil which was newly initiated on the day her symptoms developed -blood pressure regimen simplified -there may be a need to tolerate higher blood pressure transiently until outpatient follow-up  Multifocal pneumonia in immunosuppressed patient Has completed a course of antibiotic therapy -no clinical signs of active infection at present  Acute renal failure of a transplanted kidney Fortunately this has resolved with the patient's renal function recovering -she did require dialysis during this hospital stay -continue her usual immunosuppressive medications -creatinine presently normal  Resistant hypertension Elevated blood pressure continues to be an issue -as noted above she has not tolerated further titration of blood pressure medication due to significant orthostasis -monitor without change for today  Hypomagnesemia Continue to supplement  DM2 CBG reasonably controlled at present time  Septic shock -resolved Due to above  Acute metabolic encephalopathy due to multifocal pneumonia - resolved  Generalized weakness/deconditioning The recommendation by therapy has been for inpatient rehab but the patient is out of network for our in-house rehab unit -the possibility of inpatient rehab in [facility] was investigated but the patient informed me that she is not interested in this -she feels she has improved to the point that she can attend to her needs at home with help from her family -preliminary plan is for discharge home 8/1   Family Communication: No family present at time of exam today Disposition: Anticipate discharge home 8/1   Objective: Blood pressure (!) 160/80, pulse 93, temperature 98.4 F (36.9 C), temperature source Oral, resp. rate 18,  height 5\' 6"  (1.676  m), weight 105.3 kg, SpO2 98%.  Intake/Output Summary (Last 24 hours) at 11/04/2022 0858 Last data filed at 11/04/2022 0700 Gross per 24 hour  Intake 1260 ml  Output 1450 ml  Net -190 ml   Filed Weights   10/31/22 0709 11/03/22 0826 11/04/22 0622  Weight: 116.8 kg 104.3 kg 105.3 kg    Examination: General: No acute respiratory distress Lungs: Clear to auscultation bilaterally without wheezing Cardiovascular: Regular rate and rhythm without murmur gallop or rub normal S1 and S2 Abdomen: Nontender, nondistended, soft, bowel sounds positive, no rebound, no ascites, no appreciable mass Extremities: No significant edema bilateral lower extremities  CBC: Recent Labs  Lab 10/29/22 0004 11/02/22 0036 11/03/22 0319 11/04/22 0106  WBC 8.1 5.5 5.1 5.3  NEUTROABS 6.3  --  2.9  --   HGB 8.3* 10.4* 9.2* 9.1*  HCT 27.9* 34.8* 29.6* 29.4*  MCV 85.3 88.1 84.8 85.2  PLT 270 211 262 230   Basic Metabolic Panel: Recent Labs  Lab 10/30/22 0146 10/31/22 0838 11/02/22 0036 11/02/22 0430 11/03/22 0319 11/04/22 0106  NA 136   < >  --  138 135 137  K 3.6   < >  --  3.8 3.6 3.8  CL 104   < >  --  104 104 106  CO2 21*   < >  --  18* 19* 17*  GLUCOSE 130*   < >  --  242* 176* 215*  BUN 22*   < >  --  10 9 10   CREATININE 0.74   < >  --  0.85 0.74 0.89  CALCIUM 8.4*   < >  --  8.7* 8.7* 8.8*  MG  --   --   --  1.3* 1.6*  --   PHOS 4.1  --  4.5  --  4.5  --    < > = values in this interval not displayed.   GFR: Estimated Creatinine Clearance: 96.9 mL/min (by C-G formula based on SCr of 0.89 mg/dL).   Scheduled Meds:  arformoterol  15 mcg Nebulization BID   ARIPiprazole  30 mg Oral Daily   atorvastatin  10 mg Oral Daily   buPROPion  225 mg Oral BID   cloNIDine  0.2 mg Oral BID   folic acid  1 mg Oral Daily   furosemide  60 mg Oral BID   guaiFENesin  400 mg Oral Q4H   heparin  5,000 Units Subcutaneous Q8H   hydrOXYzine  25 mg Oral QHS   insulin aspart  0-20 Units  Subcutaneous TID WC   insulin aspart  4 Units Subcutaneous TID WC   insulin detemir  9 Units Subcutaneous BID   labetalol  400 mg Oral Q8H   losartan  100 mg Oral Daily   montelukast  10 mg Oral QHS   multivitamin with minerals  1 tablet Oral Daily   mycophenolate  720 mg Oral BID   mouth rinse  15 mL Mouth Rinse 4 times per day   pantoprazole  40 mg Oral Daily   prazosin  1 mg Oral QHS   predniSONE  5 mg Oral Q breakfast   revefenacin  175 mcg Nebulization Daily   spironolactone  50 mg Oral Daily   sulfamethoxazole-trimethoprim  1 tablet Oral Once per day on Monday Wednesday Friday   tacrolimus  4 mg Oral BID      LOS: 25 days   Lonia Blood, MD [facility] Office  [phone] Pager - Text Page  per Amion  If 7PM-7AM, please contact night-coverage per Amion 11/04/2022, 8:58 AM

## 2022-11-04 NOTE — TOC Progression Note (Signed)
Transition of Care [facility]) - Progression Note    Patient Details  Name: Linda Ellison MRN: [id number] Date of Birth: 1976/01/21  Transition of Care [facility]) CM/SW Contact  Tom-Johnson, Hershal Coria, RN Phone Number: 11/04/2022, 1:57 PM  Clinical Narrative:     CM spoke with patient at bedside about discharge disposition. Patient states she is going home with Home Health disciplines and declined going to [facility]. MD notified and [facility] with [place] [facility] [phone]) also notified.     Home health referral called in to [facility] as patient states no preference. Denyse Amass voiced acceptance, info on AVS.   CM will continue to follow as patient progresses with care towards discharge.       Expected Discharge Plan and Services                                               Social Determinants of Health (SDOH) Interventions SDOH Screenings   Food Insecurity: No Food Insecurity (08/27/2022)  Housing: Low Risk  (08/27/2022)  Transportation Needs: No Transportation Needs (08/27/2022)  Utilities: Not At Risk (08/27/2022)  Alcohol Screen: Low Risk  (11/21/2017)  Financial Resource Strain: Low Risk  (05/13/2021)   [facility] to [facility]05/[facility]22[facility] visits prior to 06/05/2022.  Physical Activity: Unknown (05/13/2021)   [facility], [facility] prior to [facility]/05/2022., [facility] visits prior to 06/05/2022.  Social Connections: Unknown (08/16/2021)   [facility] from [facility], [facility]  Stress: No Stress Concern Present (05/13/2021)   [facility] prior to 06/05/2022., [facility] visits prior to 06/05/2022.  Tobacco Use: Low Risk  (10/10/2022)    Readmission Risk Interventions    10/11/2022    3:46 PM   Readmission Risk Prevention Plan  Transportation Screening Complete  Medication Review (RN Care Manager) Referral to Pharmacy  PCP or Specialist appointment within 3-5 days of discharge Complete  HRI or Home Care Consult Complete  SW Recovery Care/Counseling Consult Complete  Palliative Care Screening Not Applicable  Skilled Nursing Facility Not Applicable

## 2022-11-04 NOTE — Progress Notes (Signed)
Occupational Therapy Treatment Patient Details Name: Linda Ellison MRN: [id number] DOB: 11/03/1975 Today's Date: 11/04/2022   History of present illness 47 y.o. female presents to [facility] hospital on 10/09/2022 with CAP. Pt transferred to ICU on 7/9, intubated 7/10. HDU initiated on 7/15 due to fluid overload. Bronch 7/18. Extubated 10/26/2022. PMH includes renal transplant, DMII, HTN, HLD, OSA, CAD, hypothyroidism, gastroparesis, anxiety, depression.   OT comments  Patient received in recliner following PT session. Patient provided BUE HEP and yellow therapy band and was led in BUE strengthening exercises to increase independence with sit to stands and transfers. Patient required rest breaks between each set. Following UE HEP patient's BP was 150/87. Patient performed grooming standing at sink and after 3 minutes of standing BP dropped to 97/64. Patient left in recliner with setup for lunch.  Patient will benefit from intensive inpatient follow up therapy, >3 hours/day to increase independence and safety with self care and functional transfers.    Recommendations for follow up therapy are one component of a multi-disciplinary discharge planning process, led by the attending physician.  Recommendations may be updated based on patient status, additional functional criteria and insurance authorization.    Assistance Recommended at Discharge Frequent or constant Supervision/Assistance  Patient can return home with the following  Assistance with cooking/housework;Help with stairs or ramp for entrance;Assist for transportation;Assistance with feeding;A lot of help with walking and/or transfers;A lot of help with bathing/dressing/bathroom   Equipment Recommendations  Other (comment) (defer)    Recommendations for Other Services      Precautions / Restrictions Precautions Precaution Comments: LUE AV fistula, watch BP Restrictions Weight Bearing Restrictions: No       Mobility Bed Mobility Overal bed  mobility: Needs Assistance             General bed mobility comments: OOB in recliner    Transfers Overall transfer level: Needs assistance Equipment used: Rolling walker (2 wheels) Transfers: Sit to/from Stand Sit to Stand: Min assist           General transfer comment: sit to stand from recliner to perform grooming tasks     Balance Overall balance assessment: Needs assistance Sitting-balance support: Feet supported, Bilateral upper extremity supported Sitting balance-Leahy Scale: Fair Sitting balance - Comments: up in recliner   Standing balance support: Single extremity supported, Bilateral upper extremity supported, During functional activity Standing balance-Leahy Scale: Poor Standing balance comment: stood at sink for grooming tasks with sink for support                           ADL either performed or assessed with clinical judgement   ADL Overall ADL's : Needs assistance/impaired     Grooming: Wash/dry hands;Wash/dry face;Oral care;Min guard;Standing Grooming Details (indicate cue type and reason): standing at sink                                    Extremity/Trunk Assessment              Vision       Perception     Praxis      Cognition Arousal/Alertness: Awake/alert Behavior During Therapy: WFL for tasks assessed/performed, Flat affect Overall Cognitive Status: Within Functional Limits for tasks assessed  General Comments: following commands        Exercises Exercises: General Upper Extremity General Exercises - Upper Extremity Shoulder Flexion: Strengthening, Both, 10 reps, Seated, Theraband Theraband Level (Shoulder Flexion): Level 1 (Yellow) Shoulder Horizontal ABduction: Strengthening, 15 reps, Both, Seated, Theraband Theraband Level (Shoulder Horizontal Abduction): Level 1 (Yellow) Elbow Flexion: Strengthening, Both, 15 reps, Seated, Theraband Theraband  Level (Elbow Flexion): Level 1 (Yellow) Elbow Extension: Strengthening, Both, 15 reps, Seated, Theraband Theraband Level (Elbow Extension): Level 1 (Yellow)    Shoulder Instructions       General Comments BP seated up in recliner after BUE HEP 150/87 while standing following 3 minutes of standing for grooming tasks 97/64    Pertinent Vitals/ Pain       Pain Assessment Pain Assessment: Faces Faces Pain Scale: No hurt Pain Intervention(s): Monitored during session  Home Living                                          Prior Functioning/Environment              Frequency  Min 2X/week        Progress Toward Goals  OT Goals(current goals can now be found in the care plan section)  Progress towards OT goals: Progressing toward goals  Acute Rehab OT Goals Patient Stated Goal: go home OT Goal Formulation: With patient Time For Goal Achievement: 11/11/22 Potential to Achieve Goals: Good ADL Goals Pt Will Perform Grooming: with set-up;sitting Pt Will Perform Upper Body Bathing: with min guard assist;sitting Pt Will Perform Lower Body Bathing: with mod assist;sitting/lateral leans;sit to/from stand Pt Will Transfer to Toilet: with mod assist;bedside commode;stand pivot transfer Additional ADL Goal #1: Pt will perform bed mobility with min guard A as precursor to ADL  Plan Discharge plan remains appropriate    Co-evaluation                 AM-PAC OT "6 Clicks" Daily Activity     Outcome Measure   Help from another person eating meals?: A Little Help from another person taking care of personal grooming?: A Little Help from another person toileting, which includes using toliet, bedpan, or urinal?: A Lot Help from another person bathing (including washing, rinsing, drying)?: A Lot Help from another person to put on and taking off regular upper body clothing?: A Lot Help from another person to put on and taking off regular lower body clothing?: A  Lot 6 Click Score: 14    End of Session Equipment Utilized During Treatment: Gait belt;Rolling walker (2 wheels)  OT Visit Diagnosis: Unsteadiness on feet (R26.81);Muscle weakness (generalized) (M62.81)   Activity Tolerance Patient tolerated treatment well   Patient Left in chair;with call bell/phone within reach;with chair alarm set;with family/visitor present   Nurse Communication Mobility status        Time: 4098-1191 OT Time Calculation (min): 32 min  Charges: OT General Charges $OT Visit: 1 Visit OT Treatments $Self Care/Home Management : 8-22 mins $Therapeutic Exercise: 8-22 mins  Alfonse Flavors, OTA Acute Rehabilitation Services  Office [phone]   Dewain Penning 11/04/2022, 1:46 PM

## 2022-11-05 DIAGNOSIS — J189 Pneumonia, unspecified organism: Secondary | ICD-10-CM | POA: Diagnosis not present

## 2022-11-05 LAB — GLUCOSE, CAPILLARY
Glucose-Capillary: 169 mg/dL — ABNORMAL HIGH (ref 70–99)
Glucose-Capillary: 164 mg/dL — ABNORMAL HIGH (ref 70–99)
Glucose-Capillary: 104 mg/dL — ABNORMAL HIGH (ref 70–99)
Glucose-Capillary: 139 mg/dL — ABNORMAL HIGH (ref 70–99)

## 2022-11-05 MED ORDER — OXYCODONE HCL 5 MG PO TABS
5.0000 mg | ORAL_TABLET | Freq: Three times a day (TID) | ORAL | Status: DC | PRN
  Administered 2022-11-05: 5 mg via ORAL
  Filled 2022-11-05: qty 1

## 2022-11-05 MED ORDER — BENZONATATE 100 MG PO CAPS
100.0000 mg | ORAL_CAPSULE | Freq: Once | ORAL | Status: AC | PRN
  Administered 2022-11-05: 100 mg via ORAL
  Filled 2022-11-05: qty 1

## 2022-11-05 NOTE — Progress Notes (Signed)
Physical Therapy Treatment Patient Details Name: Linda Ellison MRN: [id number] DOB: 08-20-1975 Today's Date: 11/05/2022   History of Present Illness 47 y.o. female presents to [facility] hospital on 10/09/2022 with CAP. Pt transferred to ICU on 7/9, intubated 7/10. HDU initiated on 7/15 due to fluid overload. Bronch 7/18. Extubated 10/26/2022. PMH includes renal transplant, DMII, HTN, HLD, OSA, CAD, hypothyroidism, gastroparesis, anxiety, depression.    PT Comments  Pt received in supine, agreeable to therapy session with emphasis on seated balance, transfer training (with caregiver instruction as sister in law and friend who will be helping her at home were present), and activity pacing/VS monitoring. Pt with symptomatic orthostatic hypotension, able to tolerate standing ~3 minutes prior to needing to sit in the chair x2 trials standing. Pt assisted to don compression socks prior to standing and pt's family also instructed on technique to don these prior to any OOB mobility due to pt continued BP drop when she stands. RN notified of BP and placed in flowsheet as well. Pt still eager to DC home, she will need wheelchair for home given persistent orthostatic hypotension, updated DME recommendations below. Continue to recommend short term moderate to higher intensity post acute rehab >3 hours/day as the safest disposition option for her. Orthostatic Sitting  BP- Sitting 154/87  Pulse- Sitting 89  Orthostatic Standing at 0 minutes  BP- Standing at 0 minutes 98/59  Pulse- Standing at 0 minutes 95  BP - Reclined in chair 161/89 Pulse - Reclined in chair 93   If plan is discharge home, recommend the following: Assistance with cooking/housework;Direct supervision/assist for medications management;Assist for transportation;Help with stairs or ramp for entrance;A lot of help with walking and/or transfers;A lot of help with bathing/dressing/bathroom   Can travel by private vehicle        Equipment  Recommendations  Wheelchair (measurements PT);Wheelchair cushion (measurements PT);Rolling walker (2 wheels);BSC/3in1 (recommend she obtain a rail for her bed and wheelchair ramp for home)    Recommendations for Other Services       Precautions / Restrictions Precautions Precautions: Fall Precaution Comments: LUE AV fistula, watch BP Restrictions Weight Bearing Restrictions: No     Mobility  Bed Mobility Overal bed mobility: Needs Assistance Bed Mobility: Rolling, Sidelying to Sit Rolling: Modified independent (Device/Increase time) Sidelying to sit: Modified independent (Device/Increase time)       General bed mobility comments: needs to use rail to pull herself up; pt has elevating HOB at home but no rails; discussed pt obtaining and installing a rail for her bed at home    Transfers Overall transfer level: Needs assistance Equipment used: Rolling walker (2 wheels) Transfers: Sit to/from Stand Sit to Stand: Min assist, From elevated surface   Step pivot transfers: Mod assist, Min assist, From elevated surface       General transfer comment: from elevated height bed>RW and RW>chair, posterior LOB with stepping backward toward chair short distance requiring modA to maintain standing balance.    Ambulation/Gait               General Gait Details: Limited to pivotal steps to chair only due to symptomatic orthostatic hypotension in standing despite TED hose   Stairs             Wheelchair Mobility     Tilt Bed    Modified Rankin (Stroke Patients Only)       Balance Overall balance assessment: Needs assistance Sitting-balance support: Feet supported, Bilateral upper extremity supported Sitting balance-Leahy Scale: Fair Sitting balance -  Comments: fair static sitting, poor dynamic seated balance due to posterior LOB while performing self-care tasks at EOB   Standing balance support: Bilateral upper extremity supported, During functional activity,  Reliant on assistive device for balance Standing balance-Leahy Scale: Poor Standing balance comment: RW for dynamic tasks                            Cognition Arousal/Alertness: Awake/alert Behavior During Therapy: WFL for tasks assessed/performed Overall Cognitive Status: Within Functional Limits for tasks assessed                                          Exercises Other Exercises Other Exercises: Encouraged IS use, pt pulls ~1500-1750 x10 reps Other Exercises: seated BLE AROM: ankle pumps, LAQ, hip flexion x5 reps ea for teachback Other Exercises: standing marches x5 reps ea w/RW (lightheaded) Other Exercises: verbal review for reciprocal STS x5 reps when orthostatic for strengthening if unable to safely walk    General Comments General comments (skin integrity, edema, etc.): SpO2 99% on RA; HR 89 bpm sitting EOB, BP 154/87 (105) sitting; standing BP 98/59(72) HR 95 bpm after standing ~2 mins, lighteaded/diaphoretic; Recliner BP 161/89 (110) HR 93 bpm      Pertinent Vitals/Pain Pain Assessment Pain Assessment: No/denies pain Faces Pain Scale: No hurt Pain Intervention(s): Monitored during session, Repositioned    Home Living                          Prior Function            PT Goals (current goals can now be found in the care plan section) Acute Rehab PT Goals Patient Stated Goal: Regain independence, mobilize PT Goal Formulation: With patient Time For Goal Achievement: 11/10/22 Progress towards PT goals: Progressing toward goals    Frequency    Min 1X/week      PT Plan Current plan remains appropriate    Co-evaluation              AM-PAC PT "6 Clicks" Mobility   Outcome Measure  Help needed turning from your back to your side while in a flat bed without using bedrails?: None Help needed moving from lying on your back to sitting on the side of a flat bed without using bedrails?: A Little Help needed moving to  and from a bed to a chair (including a wheelchair)?: A Lot Help needed standing up from a chair using your arms (e.g., wheelchair or bedside chair)?: A Lot Help needed to walk in hospital room?: Total Help needed climbing 3-5 steps with a railing? : Total 6 Click Score: 13    End of Session Equipment Utilized During Treatment: Gait belt Activity Tolerance: Patient tolerated treatment well;Treatment limited secondary to medical complications (Comment);Other (comment) (symptomatic orthostatic hypotension, symptoms notable after 2-3 mins standing) Patient left: in chair;with call bell/phone within reach;with family/visitor present;with chair alarm set (sister in law and friend present for teachback on guarding and assisting pt with mobility) Nurse Communication: Mobility status;Other (comment) (symptomatic orthostatic hypotension) PT Visit Diagnosis: Other abnormalities of gait and mobility (R26.89);Muscle weakness (generalized) (M62.81);Difficulty in walking, not elsewhere classified (R26.2)     Time: 9604-5409 PT Time Calculation (min) (ACUTE ONLY): 48 min  Charges:    $Therapeutic Exercise: 8-22 mins $Therapeutic Activity: 23-37 mins PT General  Charges $$ ACUTE PT VISIT: 1 Visit                      P., PTA Acute Rehabilitation Services Secure Chat Preferred 9a-5:30pm Office: [phone]    Dorathy Kinsman [facility] 11/05/2022, 3:34 PM

## 2022-11-05 NOTE — Progress Notes (Signed)
Linda Ellison  QMV:[id number] DOB: Aug 18, 1975 DOA: 10/09/2022 PCP: Daylene Katayama, PA    Brief Narrative:  47 year old with a history of renal transplant who was admitted to the hospital 7/6 with a multifocal pneumonia and ultimately required mechanical ventilation.  Her hospitalization was complicated by acute renal failure which transiently required dialysis, but her renal function subsequently improved.  She completed antibiotics for her pneumonia, was successfully extubated, and weaned to room air as of 7/26.  She was transferred to the hospitalist service 7/26.  Significant Events: 7/6 admission 7/9 transferred to ICU 7/10 intubated 7/15 worsening renal function with fluid overload -hemodialysis initiated 7/17 endotracheal tube exchange due to mucous plugging 7/18 emergent bronchoscopy for mucous plugging 7/19 Cleviprex required due to severe hypertension -repeat episode of mucous plugging 7/22 weaned on pressure support with copious secretions noted 7/23 extubated but required BiPAP 7/24 Cleviprex drip resumed 7/26 transferred to [facility] service -awaiting rehab placement  Goals of Care:   Code Status: Prior   DVT prophylaxis: Place TED hose Start: 11/03/22 1629 heparin injection 5,000 Units Start: 10/10/22 0600 SCDs Start: 10/10/22 0105  Interim Hx: No acute events recorded overnight.  Afebrile.  Vital signs stable with blood pressure modestly elevated.  The patient states she needs a little more time to make arrangements at home for her discharge but that she will be ready 11/06/2022.  She has no new complaints today.  She does continue to have some mild orthostatic symptoms when attempting to ambulate but feels that they have improved.  No chest pain or shortness of breath.  Assessment & Plan:  Acute hypoxic respiratory failure due to ARDS complicated by frequent mucous plugging Now resolved with patient successfully weaned to room air  Aspiration pneumonitis Occurred during  the night 7/30-7/31 - appears to have been a self-limited minor event -no recurrence  Orthostatic hypotension Likely related to the addition of multiple new blood pressure medications during this hospital stay, to include BiDil which was newly initiated on the day her symptoms developed -blood pressure regimen simplified -there may be a need to tolerate higher blood pressure transiently until outpatient follow-up -stable at present -counseled patient on need to make very slow positional changes and to be cognizant of potential for syncope -continue bilateral lower extremity venous compression stockings  Multifocal pneumonia in immunosuppressed patient Has completed a course of antibiotic therapy -no clinical signs of active infection at present  Acute renal failure of a transplanted kidney Fortunately this has resolved with the patient's renal function recovering -she did require dialysis during this hospital stay -continue her usual immunosuppressive medications -creatinine presently normal  Resistant hypertension Elevated blood pressure continues to be an issue -as noted above she has not tolerated further titration of blood pressure medication due to significant orthostasis -at this time we are having to except elevated blood pressure readings to avoid orthostasis that occurs when upward titration of blood pressure medications is attempted  Hypomagnesemia Continue to supplement  DM2 CBG reasonably controlled at present time  Septic shock -resolved Due to above  Acute metabolic encephalopathy due to multifocal pneumonia - resolved  Generalized weakness/deconditioning The recommendation by therapy has been for inpatient rehab but the patient is out of network for our in-house rehab unit -the possibility of inpatient rehab in [facility] was investigated but the patient informed me that she is not interested in this -she feels she has improved to the point that she can attend to her needs at  home with help from her  family -preliminary plan is for discharge home 8/2   Family Communication: No family present at time of exam today Disposition: Anticipate discharge home 8/3   Objective: Blood pressure (!) 148/83, pulse 99, temperature 98.6 F (37 C), resp. rate 16, height 5\' 6"  (1.676 m), weight 105.3 kg, SpO2 92%.  Intake/Output Summary (Last 24 hours) at 11/05/2022 0954 Last data filed at 11/05/2022 0840 Gross per 24 hour  Intake 720 ml  Output 600 ml  Net 120 ml   Filed Weights   10/31/22 0709 11/03/22 0826 11/04/22 0622  Weight: 116.8 kg 104.3 kg 105.3 kg    Examination: General: No acute respiratory distress Lungs: Clear to auscultation bilaterally -no wheeze Cardiovascular: Regular rate and rhythm without murmur gallop or rub normal S1 and S2 Abdomen: Nontender, nondistended, soft, bowel sounds positive, no rebound, no ascites, no appreciable mass Extremities: Trace edema bilateral lower extremities  CBC: Recent Labs  Lab 11/02/22 0036 11/03/22 0319 11/04/22 0106  WBC 5.5 5.1 5.3  NEUTROABS  --  2.9  --   HGB 10.4* 9.2* 9.1*  HCT 34.8* 29.6* 29.4*  MCV 88.1 84.8 85.2  PLT 211 262 230   Basic Metabolic Panel: Recent Labs  Lab 10/30/22 0146 10/31/22 0838 11/02/22 0036 11/02/22 0430 11/03/22 0319 11/04/22 0106  NA 136   < >  --  138 135 137  K 3.6   < >  --  3.8 3.6 3.8  CL 104   < >  --  104 104 106  CO2 21*   < >  --  18* 19* 17*  GLUCOSE 130*   < >  --  242* 176* 215*  BUN 22*   < >  --  10 9 10   CREATININE 0.74   < >  --  0.85 0.74 0.89  CALCIUM 8.4*   < >  --  8.7* 8.7* 8.8*  MG  --   --   --  1.3* 1.6*  --   PHOS 4.1  --  4.5  --  4.5  --    < > = values in this interval not displayed.   GFR: Estimated Creatinine Clearance: 96.9 mL/min (by C-G formula based on SCr of 0.89 mg/dL).   Scheduled Meds:  arformoterol  15 mcg Nebulization BID   ARIPiprazole  30 mg Oral Daily   atorvastatin  10 mg Oral Daily   buPROPion  225 mg Oral BID    folic acid  1 mg Oral Daily   heparin  5,000 Units Subcutaneous Q8H   hydrOXYzine  25 mg Oral QHS   insulin aspart  0-20 Units Subcutaneous TID WC   insulin aspart  4 Units Subcutaneous TID WC   insulin detemir  9 Units Subcutaneous BID   labetalol  300 mg Oral BID   losartan  100 mg Oral Daily   montelukast  10 mg Oral QHS   multivitamin with minerals  1 tablet Oral Daily   mycophenolate  720 mg Oral BID   pantoprazole  40 mg Oral Daily   prazosin  1 mg Oral QHS   predniSONE  5 mg Oral Q breakfast   revefenacin  175 mcg Nebulization Daily   spironolactone  50 mg Oral Daily   sulfamethoxazole-trimethoprim  1 tablet Oral Once per day on Monday Wednesday Friday   tacrolimus  4 mg Oral BID      LOS: 26 days   Lonia Blood, MD [facility] Office  [phone] Pager - Text Page  per Amion  If 7PM-7AM, please contact night-coverage per Amion 11/05/2022, 9:54 AM

## 2022-11-06 DIAGNOSIS — J189 Pneumonia, unspecified organism: Secondary | ICD-10-CM | POA: Diagnosis not present

## 2022-11-06 LAB — GLUCOSE, CAPILLARY
Glucose-Capillary: 129 mg/dL — ABNORMAL HIGH (ref 70–99)
Glucose-Capillary: 185 mg/dL — ABNORMAL HIGH (ref 70–99)
Glucose-Capillary: 101 mg/dL — ABNORMAL HIGH (ref 70–99)
Glucose-Capillary: 175 mg/dL — ABNORMAL HIGH (ref 70–99)
Glucose-Capillary: 112 mg/dL — ABNORMAL HIGH (ref 70–99)

## 2022-11-06 MED ORDER — BENZONATATE 100 MG PO CAPS
100.0000 mg | ORAL_CAPSULE | Freq: Three times a day (TID) | ORAL | Status: DC | PRN
  Administered 2022-11-06 – 2022-11-13 (×12): 100 mg via ORAL
  Filled 2022-11-06 (×13): qty 1

## 2022-11-06 NOTE — Discharge Summary (Incomplete)
DISCHARGE SUMMARY  Linda Ellison  MR#: [id number]  DOB:[birth year]  Date of Admission: 10/09/2022 Date of Discharge: 11/06/2022  Attending Physician: Silvestre Gunner, MD  Patient's [id number], [place], [place]  Disposition: Discharge home at insistence of patient  Follow-up Appts:  Follow-up Information     [facility] Follow up.   Specialty: Home Health Services Why: Someone will call you to schedule first home visit. Contact information: [address] [place] [address] [phone]                 Tests Needing Follow-up: -Further titration of blood pressure medications will likely be required but attention will also need to be paid to her orthostatic status as attempts to titrate blood pressure medication in house led to severe orthostasis  Discharge Diagnoses: Acute hypoxic respiratory failure due to ARDS complicated by frequent mucous plugging Aspiration pneumonitis Orthostatic hypotension Multifocal pneumonia in immunosuppressed patient Acute renal failure of a transplanted kidney Resistant hypertension Hypomagnesemia DM2 Septic shock -resolved Acute metabolic encephalopathy due to multifocal pneumonia - resolved Generalized weakness/deconditioning  Initial presentation: 47 year old with a history of renal transplant who was admitted to the hospital 7/6 with a multifocal pneumonia and ultimately required mechanical ventilation.  Her hospitalization was complicated by acute renal failure which transiently required dialysis, but her renal function subsequently improved.  She completed antibiotics for her pneumonia, was successfully extubated, and weaned to room air as of 7/26.  She was transferred to the Hospitalist service 7/26.   Hospital Course: 7/6 admission 7/9 transferred to ICU 7/10 intubated 7/15 worsening renal function with fluid overload -hemodialysis initiated 7/17 endotracheal tube exchange due to mucous  plugging 7/18 emergent bronchoscopy for mucous plugging 7/19 Cleviprex required due to severe hypertension -repeat episode of mucous plugging 7/22 weaned on pressure support with copious secretions noted 7/23 extubated but required BiPAP 7/24 Cleviprex drip resumed 7/26 transferred to [facility] service -awaiting rehab placement  Acute hypoxic respiratory failure due to ARDS complicated by frequent mucous plugging Now resolved with patient successfully weaned to room air -oxygen saturation 100% on room air at time of discharge   Aspiration pneumonitis Occurred during the night 7/30-7/31 - appears to have been a self-limited minor event -no recurrence during remainder of hospital stay   Orthostatic hypotension Likely related to the addition of multiple new blood pressure medications during this hospital stay, to include BiDil which was newly initiated on the day her symptoms developed, as well as diuresis/volume depletion, and prolonged bedrest - blood pressure regimen simplified -there may be a need to tolerate higher blood pressure transiently until outpatient follow-up -has been slowly improving over latter course of hospital stay - counseled patient on need to make very slow positional changes and to be cognizant of potential for syncope -continue bilateral lower extremity venous compression stockings -suspect with increased dietary intake and fluid at home that this will resolve   Multifocal pneumonia in immunosuppressed patient Has completed a course of antibiotic therapy -no clinical signs of active infection at time of discharge   Acute renal failure of a transplanted kidney Fortunately this has resolved with the patient's renal function recovering - she did require dialysis during this hospital stay -continue her usual immunosuppressive medications -creatinine presently normal   Resistant hypertension Elevated blood pressure continues to be an issue -as noted above she has not tolerated  further titration of blood pressure medication due to significant orthostasis -at this time we are having to accept elevated blood pressure readings to  avoid orthostasis that occurs when upward titration of blood pressure medications is attempted -in outpatient follow-up, when the patient is more consistently mobile, further titration of blood pressure medications will be indicated   Hypomagnesemia Corrected during hospital stay   DM2 CBG controlled at time of discharge   Septic shock - resolved Due to above   Acute metabolic encephalopathy due to multifocal pneumonia - resolved Mental status has returned to baseline prior to discharge  Generalized weakness/deconditioning The recommendation by therapy has been for inpatient rehab but the patient is out of network for our in-house rehab unit - the possibility of inpatient rehab in [facility] was investigated but the patient informed us that she is not interested in this - she feels she has improved to the point that she can attend to her needs at home with help from her family/friends - DME needs as suggested by PT were arranged - orders entered for [facility] assistance -the patient was strongly encouraged during 3+ different conversations to allow [place] to admit her to an inpatient rehab facility with this physician explained to her that I did not feel she would do well at home and that she was too weak and that this would expose her to a high risk of falls and possible significant injury -despite these multiple conversations she persisted and her insistence of going home -her hospital stay was extended an additional 2-3 days to allow her time to make arrangements at home -at this point she is losing ground by being in the hospital and disposition must be carried forward -unfortunately she remains unwilling to consider inpatient rehab and therefore discharge home is our only option    Allergies as of 11/06/2022       Reactions   Ciprofloxacin Hives,  Itching, Nausea And Vomiting   Fluocinolone Itching, Nausea And Vomiting, Other (See Comments)   Hydromorphone Hives   Strawberry Extract Itching, Other (See Comments)   Pt states that this medication makes her tongue raw.   No reaction   Hydromorphone Hcl Hives   Phenytoin Sodium Extended Itching   Adhesive [tape] Itching, Rash   Chlorhexidine Gluconate Itching   Clindamycin Diarrhea, Nausea And Vomiting, Rash   Shrimp [shellfish Allergy] Rash     Med Rec must be completed prior to using this SMART[employer]***       Day of Discharge BP (!) 159/77 (BP Location: Right Arm)   Pulse 97   Temp 98.4 F (36.9 C)   Resp 18   Ht 5\' 6"  (1.676 m)   Wt 105.3 kg   SpO2 100%   BMI 37.47 kg/m   Physical Exam: General: No acute respiratory distress Lungs: Clear to auscultation bilaterally without wheezes or crackles Cardiovascular: Regular rate and rhythm without murmur gallop or rub normal S1 and S2 Abdomen: Nontender, nondistended, soft, bowel sounds positive, no rebound, no ascites, no appreciable mass Extremities: No significant cyanosis, clubbing, or edema bilateral lower extremities  Basic Metabolic Panel: Recent Labs  Lab 10/31/22 0838 11/01/22 0224 11/02/22 0036 11/02/22 0430 11/03/22 0319 11/04/22 0106  NA 137 138  --  138 135 137  K 4.1 3.6  --  3.8 3.6 3.8  CL 105 103  --  104 104 106  CO2 18* 20*  --  18* 19* 17*  GLUCOSE 300* 173*  --  242* 176* 215*  BUN 12 11  --  10 9 10   CREATININE 0.80 0.71  --  0.85 0.74 0.89  CALCIUM 8.6* 8.7*  --  8.7* 8.7* 8.8*  MG  --   --   --  1.3* 1.6*  --   PHOS  --   --  4.5  --  4.5  --     CBC: Recent Labs  Lab 11/02/22 0036 11/03/22 0319 11/04/22 0106  WBC 5.5 5.1 5.3  NEUTROABS  --  2.9  --   HGB 10.4* 9.2* 9.1*  HCT 34.8* 29.6* 29.4*  MCV 88.1 84.8 85.2  PLT 211 262 230    Time spent in discharge (includes decision making & examination of pt): 35 minutes  11/06/2022, 8:59 AM   Lonia Blood, MD [facility] Office  [phone]

## 2022-11-06 NOTE — Progress Notes (Signed)
Physical Therapy Treatment Patient Details Name: Linda Ellison MRN: [id number] DOB: [birth year] Today's Date: 11/06/2022   History of Present Illness 47 y.o. female presents to [facility] hospital on 10/09/2022 with CAP. Pt transferred to ICU on 7/9, intubated 7/10. HDU initiated on 7/15 due to fluid overload. Bronch 7/18. Extubated 10/26/2022. PMH includes renal transplant, DMII, HTN, HLD, OSA, CAD, hypothyroidism, gastroparesis, anxiety, depression.    PT Comments  Pt tolerates treatment well, with emphasis on improving transfer quality to reduce caregiver burden. PT provides extensive cues to increase trunk flexion and forward rock when initiating transfers. PT also emphasizes hand placement and foot placement pre-transfer to optimize efficiency. Pt stands 5 times during session, with improving quality each time. PT provides education on recommendation for high intensity inpatient PT services and the benefits this could have in leading to a return to independence in opposition to low intensity home health therapies.     If plan is discharge home, recommend the following: Assistance with cooking/housework;Direct supervision/assist for medications management;Assist for transportation;Help with stairs or ramp for entrance;A lot of help with walking and/or transfers;A lot of help with bathing/dressing/bathroom   Can travel by private vehicle        Equipment Recommendations  Wheelchair (measurements PT);Wheelchair cushion (measurements PT);Rolling walker (2 wheels);BSC/3in1    Recommendations for Other Services       Precautions / Restrictions Precautions Precautions: Fall Precaution Comments: LUE AV fistula, watch BP Restrictions Weight Bearing Restrictions: No     Mobility  Bed Mobility Overal bed mobility: Needs Assistance Bed Mobility: Rolling, Sidelying to Sit Rolling: Min guard Sidelying to sit: Min assist            Transfers Overall transfer level: Needs assistance Equipment  used: Rolling walker (2 wheels) Transfers: Sit to/from Stand, Bed to chair/wheelchair/BSC Sit to Stand: Min assist, Min guard, From elevated surface   Step pivot transfers: Min assist            Ambulation/Gait Ambulation/Gait assistance: Editor, commissioning (Feet): 3 Feet Assistive device: Rolling walker (2 wheels) Gait Pattern/deviations: Step-to pattern Gait velocity: reduced Gait velocity interpretation: <1.31 ft/sec, indicative of household ambulator   General Gait Details: slowed step-to gait, reduced foot clearance bilaterally   Stairs             Wheelchair Mobility     Tilt Bed    Modified Rankin (Stroke Patients Only)       Balance Overall balance assessment: Needs assistance Sitting-balance support: No upper extremity supported, Feet supported Sitting balance-Leahy Scale: Fair     Standing balance support: Bilateral upper extremity supported, Reliant on assistive device for balance Standing balance-Leahy Scale: Poor                              Cognition Arousal/Alertness: Awake/alert Behavior During Therapy: Flat affect Overall Cognitive Status: Impaired/Different from baseline Area of Impairment: Awareness                           Awareness: Emergent            Exercises      General Comments General comments (skin integrity, edema, etc.): pt remains orthostatic in standing with use of TED hose. Vitals documented in flowsheet      Pertinent Vitals/Pain Pain Assessment Pain Assessment: No/denies pain    Home Living  Prior Function            PT Goals (current goals can now be found in the care plan section) Acute Rehab PT Goals Patient Stated Goal: Regain independence, mobilize Progress towards PT goals: Progressing toward goals    Frequency    Min 1X/week      PT Plan Current plan remains appropriate    Co-evaluation              AM-PAC  PT "6 Clicks" Mobility   Outcome Measure  Help needed turning from your back to your side while in a flat bed without using bedrails?: A Little Help needed moving from lying on your back to sitting on the side of a flat bed without using bedrails?: A Little Help needed moving to and from a bed to a chair (including a wheelchair)?: A Little Help needed standing up from a chair using your arms (e.g., wheelchair or bedside chair)?: A Little Help needed to walk in hospital room?: Total Help needed climbing 3-5 steps with a railing? : Total 6 Click Score: 14    End of Session   Activity Tolerance: Patient tolerated treatment well Patient left: in chair;with call bell/phone within reach;with family/visitor present Nurse Communication: Mobility status PT Visit Diagnosis: Other abnormalities of gait and mobility (R26.89);Muscle weakness (generalized) (M62.81);Difficulty in walking, not elsewhere classified (R26.2)     Time: 1610-9604 PT Time Calculation (min) (ACUTE ONLY): 57 min  Charges:    $Therapeutic Activity: 53-67 mins PT General Charges $$ ACUTE PT VISIT: 1 Visit                     Arlyss Gandy, PT, DPT Acute Rehabilitation Office [phone]    Arlyss Gandy 11/06/2022, 3:43 PM

## 2022-11-06 NOTE — Progress Notes (Addendum)
Occupational Therapy Treatment Patient Details Name: Linda Ellison MRN: [id number] DOB: [birth year] Today's Date: 11/06/2022   History of present illness 47 y.o. female presents to [facility] hospital on 10/09/2022 with CAP. Pt transferred to ICU on 7/9, intubated 7/10. HDU initiated on 7/15 due to fluid overload. Bronch 7/18. Extubated 10/26/2022. PMH includes renal transplant, DMII, HTN, HLD, OSA, CAD, hypothyroidism, gastroparesis, anxiety, depression.   OT comments  Pt. Seen for skilled OT treatment session.  Able to tolerate increased activity tolerance this session. In un supported long sitting pt. Performed LB dressing and grooming tasks.  Cont. Efforts with BUE HEP.  Good carry over from previous instruction.  Pt. Demonstrating appropriate initiation of rest breaks when needed.  Making good gains. Agree with current d/c recommendations.   BPs DURING SESSION:  Supine: 168/88-112 (96) Sit: 118/74-85 (99) Sit after exercise: 160/79-103 (97)   Recommendations for follow up therapy are one component of a multi-disciplinary discharge planning process, led by the attending physician.  Recommendations may be updated based on patient status, additional functional criteria and insurance authorization.    Assistance Recommended at Discharge Frequent or constant Supervision/Assistance  Patient can return home with the following  Assistance with cooking/housework;Help with stairs or ramp for entrance;Assist for transportation;Assistance with feeding;A lot of help with walking and/or transfers;A lot of help with bathing/dressing/bathroom   Equipment Recommendations       Recommendations for Other Services Rehab consult    Precautions / Restrictions Precautions Precautions: Fall Precaution Comments: LUE AV fistula, watch BP Restrictions Weight Bearing Restrictions: No       Mobility Bed Mobility Overal bed mobility: Needs Assistance Bed Mobility: Supine to Sit, Sit to Supine     Supine to sit:  Min assist, Mod assist, HOB elevated Sit to supine: Min assist, Mod assist   General bed mobility comments: heavy use of lower bed rails to pull self up even with hob elevated to highest position in sitting.  for back to bed layed back, with hob flat and boost function, encouragement for pt. to push through legs as she pulled with arms, unable to push through legs w/o assitance but did engage and support with bues for pulling    Transfers                         Balance                                           ADL either performed or assessed with clinical judgement   ADL Overall ADL's : Needs assistance/impaired     Grooming: Bed level;Minimal assistance (apply lotion to B feet) Grooming Details (indicate cue type and reason): in un supported long sitting pt. able to reach B feet and bend knee while pulling foot towards her to reach foot to pull each sock off, then applied lotion with min a (increased time to doff socks with rest breaks required after each sock)             Lower Body Dressing: Set up;Bed level Lower Body Dressing Details (indicate cue type and reason): in un supported long sitting, pt. able to bend each le and bring foot closer and access her socks and push off of heels then pull the rest of the way off. increased effort and time for this task with rest breaks required after each sock  General ADL Comments: pt. in un supported long sitting for duration of session. increased time for all tasks with appropriately initiated rest breaks during session.  encouraged pt. to utilize this position as she did well with it and engage in more adls, ie: bathing to wash the areas she can reach with tray table beside the bed in reach.  reviewed benefits of particiaption in all tasks to promote increased act. tolerance and ind. with adls    Extremity/Trunk Assessment              Vision       Perception     Praxis       Cognition Arousal/Alertness: Awake/alert Behavior During Therapy: WFL for tasks assessed/performed Overall Cognitive Status: Within Functional Limits for tasks assessed                                          Exercises General Exercises - Upper Extremity Shoulder Flexion: Strengthening, Both, 10 reps, Seated, Theraband Theraband Level (Shoulder Flexion): Level 1 (Yellow) Elbow Flexion: Strengthening, Both, 15 reps, Seated, Theraband Theraband Level (Elbow Flexion): Level 1 (Yellow) Elbow Extension: Strengthening, Both, 15 reps, Seated, Theraband Theraband Level (Elbow Extension): Level 1 (Yellow)    Shoulder Instructions       General Comments      Pertinent Vitals/ Pain       Pain Assessment Pain Assessment: No/denies pain  Home Living                                          Prior Functioning/Environment              Frequency  Min 2X/week        Progress Toward Goals  OT Goals(current goals can now be found in the care plan section)  Progress towards OT goals: Progressing toward goals     Plan Discharge plan remains appropriate    Co-evaluation                 AM-PAC OT "6 Clicks" Daily Activity     Outcome Measure   Help from another person eating meals?: A Little Help from another person taking care of personal grooming?: A Little Help from another person toileting, which includes using toliet, bedpan, or urinal?: A Lot Help from another person bathing (including washing, rinsing, drying)?: A Lot Help from another person to put on and taking off regular upper body clothing?: A Lot Help from another person to put on and taking off regular lower body clothing?: A Lot 6 Click Score: 14    End of Session    OT Visit Diagnosis: Unsteadiness on feet (R26.81);Muscle weakness (generalized) (M62.81)   Activity Tolerance Patient tolerated treatment well   Patient Left in bed;with call bell/phone within  reach   Nurse Communication Other (comment) (cna present getting vs, reviewed by bps with her and she logged them in the chart)        Time: 0102-7253 OT Time Calculation (min): 30 min  Charges: OT General Charges $OT Visit: 1 Visit OT Treatments $Self Care/Home Management : 8-22 mins $Therapeutic Exercise: 8-22 mins  Boneta Lucks, COTA/L Acute Rehabilitation [phone]   Alessandra Bevels Lorraine-COTA/L 11/06/2022, 11:27 AM

## 2022-11-06 NOTE — Plan of Care (Signed)
  Problem: Fluid Volume: Goal: Ability to maintain a balanced intake and output will improve Outcome: Progressing   Problem: Nutritional: Goal: Maintenance of adequate nutrition will improve Outcome: Progressing   Problem: Clinical Measurements: Goal: Respiratory complications will improve Outcome: Progressing Goal: Cardiovascular complication will be avoided Outcome: Progressing   Problem: Nutrition: Goal: Adequate nutrition will be maintained Outcome: Progressing   Problem: Coping: Goal: Level of anxiety will decrease Outcome: Progressing   Problem: Elimination: Goal: Will not experience complications related to urinary retention Outcome: Progressing

## 2022-11-06 NOTE — Progress Notes (Signed)
Linda Ellison  ZOX:[id number] DOB: 05/28/1975 DOA: 10/09/2022 PCP: Daylene Katayama, PA    Brief Narrative:  47 year old with a history of renal transplant who was admitted to the hospital 7/6 with a multifocal pneumonia and ultimately required mechanical ventilation.  Her hospitalization was complicated by acute renal failure which transiently required dialysis, but her renal function subsequently improved.  She completed antibiotics for her pneumonia, was successfully extubated, and weaned to room air as of 7/26.  She was transferred to the hospitalist service 7/26.  Significant Events: 7/6 admission 7/9 transferred to ICU 7/10 intubated 7/15 worsening renal function with fluid overload -hemodialysis initiated 7/17 endotracheal tube exchange due to mucous plugging 7/18 emergent bronchoscopy for mucous plugging 7/19 Cleviprex required due to severe hypertension -repeat episode of mucous plugging 7/22 weaned on pressure support with copious secretions noted 7/23 extubated but required BiPAP 7/24 Cleviprex drip resumed 7/26 transferred to [facility] service -awaiting rehab placement  Goals of Care:   Code Status: Prior   DVT prophylaxis: Place TED hose Start: 11/03/22 1629 heparin injection 5,000 Units Start: 10/10/22 0600 SCDs Start: 10/10/22 0105  Interim Hx: Afebrile.  Vital signs stable.  No acute events reported overnight.  The patient is medically stable at this time, but she remains very weak.  I feel strongly that the safest option for disposition remains inpatient rehab for intensive rehabilitation, but the patient is still not willing to consider this.  I have offered her 1 more night to consider her options and explained to her that we cannot simply prolong her hospital stay because what she needs is rehab and that we cannot provide that here in the acute care hospital.  Assessment & Plan:  Acute hypoxic respiratory failure due to ARDS complicated by frequent mucous plugging Now  resolved with patient successfully weaned to room air  Aspiration pneumonitis Occurred during the night 7/30-7/31 - appears to have been a self-limited minor event -no recurrence  Orthostatic hypotension Likely related to the addition of multiple new blood pressure medications during this hospital stay, to include BiDil which was newly initiated on the day her symptoms developed -blood pressure regimen simplified -there may be a need to tolerate higher blood pressure transiently until outpatient follow-up -stable at present -counseled patient on need to make very slow positional changes and to be cognizant of potential for syncope -continue bilateral lower extremity venous compression stockings -improving overall with increasing mobility and minimizing of blood pressure medications  Multifocal pneumonia in immunosuppressed patient Has completed a course of antibiotic therapy -no clinical signs of active infection at present  Acute renal failure of a transplanted kidney Fortunately this has resolved with the patient's renal function recovering -she did require dialysis during this hospital stay -continue her usual immunosuppressive medications -creatinine presently normal  Resistant hypertension Elevated blood pressure continues to be an issue -as noted above she has not tolerated further titration of blood pressure medication due to significant orthostasis -at this time we are having to except elevated blood pressure readings to avoid orthostasis that occurs when upward titration of blood pressure medications is attempted  Hypomagnesemia Continue to supplement  DM2 CBG reasonably controlled at present time  Septic shock -resolved Due to above  Acute metabolic encephalopathy due to multifocal pneumonia - resolved  Generalized weakness/deconditioning The recommendation by therapy has been for inpatient rehab but the patient is out of network for our in-house rehab unit -the possibility of  inpatient rehab in [facility] was investigated but the patient informed me  that she is not interested in this -she feels she has improved to the point that she can attend to her needs at home with help from her family -in my opinion she remains too weak to go home as per discussion above, but she has now medically stabilized and needs to make a decision as soon as possible because we are beginning to lose ground in regard to rehab and that her rehab needs are higher than what can be provided in the acute care setting   Family Communication: Spoke with patient and husband at bedside Disposition: Anticipate discharge home 8/4   Objective: Blood pressure (!) 168/88, pulse 96, temperature 98.5 F (36.9 C), temperature source Oral, resp. rate 18, height 5\' 6"  (1.676 m), weight 105.3 kg, SpO2 100%.  Intake/Output Summary (Last 24 hours) at 11/06/2022 1552 Last data filed at 11/06/2022 0900 Gross per 24 hour  Intake 580 ml  Output 0 ml  Net 580 ml   Filed Weights   10/31/22 0709 11/03/22 0826 11/04/22 0622  Weight: 116.8 kg 104.3 kg 105.3 kg    Examination: General: No acute respiratory distress Lungs: Clear to auscultation bilaterally -no wheeze Cardiovascular: Regular rate and rhythm without murmur gallop or rub normal S1 and S2 Abdomen: NT/ND, soft, BS positive, no rebound Extremities: Trace edema bilateral lower extremities  CBC: Recent Labs  Lab 11/02/22 0036 11/03/22 0319 11/04/22 0106  WBC 5.5 5.1 5.3  NEUTROABS  --  2.9  --   HGB 10.4* 9.2* 9.1*  HCT 34.8* 29.6* 29.4*  MCV 88.1 84.8 85.2  PLT 211 262 230   Basic Metabolic Panel: Recent Labs  Lab 11/02/22 0036 11/02/22 0430 11/03/22 0319 11/04/22 0106  NA  --  138 135 137  K  --  3.8 3.6 3.8  CL  --  104 104 106  CO2  --  18* 19* 17*  GLUCOSE  --  242* 176* 215*  BUN  --  10 9 10   CREATININE  --  0.85 0.74 0.89  CALCIUM  --  8.7* 8.7* 8.8*  MG  --  1.3* 1.6*  --   PHOS 4.5  --  4.5  --    GFR: Estimated  Creatinine Clearance: 96.9 mL/min (by C-G formula based on SCr of 0.89 mg/dL).   Scheduled Meds:  arformoterol  15 mcg Nebulization BID   ARIPiprazole  30 mg Oral Daily   atorvastatin  10 mg Oral Daily   buPROPion  225 mg Oral BID   folic acid  1 mg Oral Daily   heparin  5,000 Units Subcutaneous Q8H   hydrOXYzine  25 mg Oral QHS   insulin aspart  0-20 Units Subcutaneous TID WC   insulin aspart  4 Units Subcutaneous TID WC   insulin detemir  9 Units Subcutaneous BID   labetalol  300 mg Oral BID   losartan  100 mg Oral Daily   montelukast  10 mg Oral QHS   multivitamin with minerals  1 tablet Oral Daily   mycophenolate  720 mg Oral BID   pantoprazole  40 mg Oral Daily   prazosin  1 mg Oral QHS   predniSONE  5 mg Oral Q breakfast   revefenacin  175 mcg Nebulization Daily   spironolactone  50 mg Oral Daily   sulfamethoxazole-trimethoprim  1 tablet Oral Once per day on Monday Wednesday Friday   tacrolimus  4 mg Oral BID      LOS: 27 days   Lonia Blood, MD  [facility] Office  [phone] Pager - Text Page per Loretha Stapler  If 7PM-7AM, please contact night-coverage per Amion 11/06/2022, 3:52 PM

## 2022-11-07 DIAGNOSIS — J189 Pneumonia, unspecified organism: Secondary | ICD-10-CM | POA: Diagnosis not present

## 2022-11-07 LAB — GLUCOSE, CAPILLARY
Glucose-Capillary: 146 mg/dL — ABNORMAL HIGH (ref 70–99)
Glucose-Capillary: 93 mg/dL (ref 70–99)
Glucose-Capillary: 119 mg/dL — ABNORMAL HIGH (ref 70–99)
Glucose-Capillary: 137 mg/dL — ABNORMAL HIGH (ref 70–99)

## 2022-11-07 NOTE — Progress Notes (Signed)
Linda Ellison  ZOX:[id number] DOB: 1976/02/29 DOA: 10/09/2022 PCP: Daylene Katayama, PA    Brief Narrative:  47 year old with a history of renal transplant who was admitted to the hospital 7/6 with a multifocal pneumonia and ultimately required mechanical ventilation.  Her hospitalization was complicated by acute renal failure which transiently required dialysis, but her renal function subsequently improved.  She completed antibiotics for her pneumonia, was successfully extubated, and weaned to room air as of 7/26.  She was transferred to the hospitalist service 7/26.  Significant Events: 7/6 admission 7/9 transferred to ICU 7/10 intubated 7/15 worsening renal function with fluid overload -hemodialysis initiated 7/17 endotracheal tube exchange due to mucous plugging 7/18 emergent bronchoscopy for mucous plugging 7/19 Cleviprex required due to severe hypertension -repeat episode of mucous plugging 7/22 weaned on pressure support with copious secretions noted 7/23 extubated but required BiPAP 7/24 Cleviprex drip resumed 7/26 transferred to [facility] service -awaiting rehab placement  Goals of Care:   Code Status: Prior   DVT prophylaxis: Place TED hose Start: 11/03/22 1629 heparin injection 5,000 Units Start: 10/10/22 0600 SCDs Start: 10/10/22 0105  Interim Hx: Afebrile.  Blood pressure remains elevated with systolics 143-177, but orthostatics have been improving with adjustments in blood pressure medications.  No new complaints today.  Continues to be bothered by dry hacking cough especially at night.  Persistent her desire to go home and asked for 1 more night to allow family to arrange for her arrival tomorrow.  We have agreed to her discharge home 8/5, though I have again informed her that I feel discharged to a rehab facility would be the better option.  Assessment & Plan:  Acute hypoxic respiratory failure due to ARDS complicated by frequent mucous plugging Now resolved with patient  successfully weaned to room air  Aspiration pneumonitis Occurred during the night 7/30-7/31 - appears to have been a self-limited minor event -no recurrence  Orthostatic hypotension Likely related to the addition of multiple new blood pressure medications during this hospital stay, to include BiDil which was newly initiated on the day her symptoms developed -blood pressure regimen simplified -there may be a need to tolerate higher blood pressure transiently until outpatient follow-up - counseled patient on need to make very slow positional changes and to be cognizant of potential for syncope - continue bilateral lower extremity venous compression stockings - improving overall with increasing mobility and minimizing of blood pressure medications  Multifocal pneumonia in immunosuppressed patient Has completed a course of antibiotic therapy -no clinical signs of active infection at present  Acute renal failure of a transplanted kidney Fortunately this has resolved with the patient's renal function recovering - she did require dialysis during this hospital stay - continue her usual immunosuppressive medications - creatinine presently normal  Resistant hypertension Elevated blood pressure continues to be an issue -as noted above she has not tolerated further titration of blood pressure medication due to significant orthostasis - at this time we are having to accept elevated blood pressure readings to avoid orthostasis that occurs when upward titration of blood pressure medications is attempted   Hypomagnesemia Supplementing   DM2 CBG well controlled at present time  Septic shock - resolved Due to above  Acute metabolic encephalopathy due to multifocal pneumonia - resolved  Generalized weakness/deconditioning The recommendation by therapy has been for inpatient rehab but the patient is out of network for our in-house rehab unit - the possibility of inpatient rehab in [facility] was  investigated but the patient informed me  that she is not interested in this - she feels she has improved to the point that she can attend to her needs at home with help from her family - in my opinion she remains too weak to go home as per discussion above, but she has now medically stabilized and needs to make a decision as soon as possible because we are beginning to lose ground in regard to rehab in that her rehab needs are higher than what can be provided in the acute care setting   Family Communication:  Disposition: Anticipate discharge home 8/5 at patient request after refusing inpatient rehab   Objective: Blood pressure (!) 177/89, pulse 99, temperature 98.6 F (37 C), resp. rate 18, height 5\' 6"  (1.676 m), weight 105.3 kg, SpO2 94%.  Intake/Output Summary (Last 24 hours) at 11/07/2022 0853 Last data filed at 11/06/2022 2150 Gross per 24 hour  Intake 240 ml  Output 500 ml  Net -260 ml   Filed Weights   10/31/22 0709 11/03/22 0826 11/04/22 0622  Weight: 116.8 kg 104.3 kg 105.3 kg    Examination: General: No acute respiratory distress Lungs: Clear to auscultation bilaterally -no wheeze Cardiovascular: Regular rate and rhythm without murmur gallop or rub normal S1 and S2 Abdomen: NT/ND, soft, BS positive, no rebound Extremities: Trace edema BLE  CBC: Recent Labs  Lab 11/02/22 0036 11/03/22 0319 11/04/22 0106  WBC 5.5 5.1 5.3  NEUTROABS  --  2.9  --   HGB 10.4* 9.2* 9.1*  HCT 34.8* 29.6* 29.4*  MCV 88.1 84.8 85.2  PLT 211 262 230   Basic Metabolic Panel: Recent Labs  Lab 11/02/22 0036 11/02/22 0430 11/03/22 0319 11/04/22 0106  NA  --  138 135 137  K  --  3.8 3.6 3.8  CL  --  104 104 106  CO2  --  18* 19* 17*  GLUCOSE  --  242* 176* 215*  BUN  --  10 9 10   CREATININE  --  0.85 0.74 0.89  CALCIUM  --  8.7* 8.7* 8.8*  MG  --  1.3* 1.6*  --   PHOS 4.5  --  4.5  --    GFR: Estimated Creatinine Clearance: 96.9 mL/min (by C-G formula based on SCr of 0.89  mg/dL).   Scheduled Meds:  arformoterol  15 mcg Nebulization BID   ARIPiprazole  30 mg Oral Daily   atorvastatin  10 mg Oral Daily   buPROPion  225 mg Oral BID   folic acid  1 mg Oral Daily   heparin  5,000 Units Subcutaneous Q8H   hydrOXYzine  25 mg Oral QHS   insulin aspart  0-20 Units Subcutaneous TID WC   insulin aspart  4 Units Subcutaneous TID WC   insulin detemir  9 Units Subcutaneous BID   labetalol  300 mg Oral BID   losartan  100 mg Oral Daily   montelukast  10 mg Oral QHS   multivitamin with minerals  1 tablet Oral Daily   mycophenolate  720 mg Oral BID   pantoprazole  40 mg Oral Daily   prazosin  1 mg Oral QHS   predniSONE  5 mg Oral Q breakfast   revefenacin  175 mcg Nebulization Daily   spironolactone  50 mg Oral Daily   sulfamethoxazole-trimethoprim  1 tablet Oral Once per day on Monday Wednesday Friday   tacrolimus  4 mg Oral BID      LOS: 28 days   Lonia Blood, MD [facility]  Office  [phone] Pager - Text Page per Loretha Stapler  If 7PM-7AM, please contact night-coverage per Amion 11/07/2022, 8:53 AM

## 2022-11-07 NOTE — Plan of Care (Signed)
  Problem: Nutritional: Goal: Maintenance of adequate nutrition will improve Outcome: Progressing   Problem: Skin Integrity: Goal: Risk for impaired skin integrity will decrease Outcome: Progressing   Problem: Clinical Measurements: Goal: Cardiovascular complication will be avoided Outcome: Progressing   Problem: Coping: Goal: Level of anxiety will decrease Outcome: Progressing   Problem: Elimination: Goal: Will not experience complications related to urinary retention Outcome: Progressing

## 2022-11-08 ENCOUNTER — Inpatient Hospital Stay (HOSPITAL_COMMUNITY): Payer: Medicare (Managed Care)

## 2022-11-08 DIAGNOSIS — J189 Pneumonia, unspecified organism: Secondary | ICD-10-CM | POA: Diagnosis not present

## 2022-11-08 LAB — GLUCOSE, CAPILLARY
Glucose-Capillary: 157 mg/dL — ABNORMAL HIGH (ref 70–99)
Glucose-Capillary: 143 mg/dL — ABNORMAL HIGH (ref 70–99)
Glucose-Capillary: 174 mg/dL — ABNORMAL HIGH (ref 70–99)
Glucose-Capillary: 119 mg/dL — ABNORMAL HIGH (ref 70–99)

## 2022-11-08 MED ORDER — INSULIN ASPART 100 UNIT/ML IJ SOLN: 0.0000 [IU] | Freq: Every day | INTRAMUSCULAR | Status: DC

## 2022-11-08 MED ORDER — INSULIN ASPART 100 UNIT/ML IJ SOLN
0.0000 [IU] | Freq: Three times a day (TID) | INTRAMUSCULAR | Status: DC
  Administered 2022-11-08: 4 [IU] via SUBCUTANEOUS
  Administered 2022-11-09 (×3): 3 [IU] via SUBCUTANEOUS
  Administered 2022-11-10: 4 [IU] via SUBCUTANEOUS
  Administered 2022-11-10: 7 [IU] via SUBCUTANEOUS
  Administered 2022-11-11 – 2022-11-13 (×5): 4 [IU] via SUBCUTANEOUS

## 2022-11-08 MED ORDER — INSULIN DETEMIR 100 UNIT/ML ~~LOC~~ SOLN
10.0000 [IU] | Freq: Two times a day (BID) | SUBCUTANEOUS | Status: DC
  Administered 2022-11-08 – 2022-11-13 (×10): 10 [IU] via SUBCUTANEOUS
  Filled 2022-11-08 (×12): qty 0.1

## 2022-11-08 MED ORDER — DILTIAZEM HCL ER COATED BEADS 120 MG PO CP24: 120.0000 mg | ORAL_CAPSULE | Freq: Every day | ORAL | Status: DC

## 2022-11-08 MED ORDER — INSULIN ASPART 100 UNIT/ML IJ SOLN
4.0000 [IU] | Freq: Three times a day (TID) | INTRAMUSCULAR | Status: DC
  Administered 2022-11-09 – 2022-11-13 (×11): 4 [IU] via SUBCUTANEOUS

## 2022-11-08 MED ORDER — LORATADINE 10 MG PO TABS
10.0000 mg | ORAL_TABLET | Freq: Every day | ORAL | Status: AC
  Administered 2022-11-08 – 2022-11-12 (×5): 10 mg via ORAL
  Filled 2022-11-08 (×5): qty 1

## 2022-11-08 MED ORDER — INSULIN STARTER KIT- PEN NEEDLES (ENGLISH)
1.0000 | Freq: Once | Status: AC
  Administered 2022-11-09: 1
  Filled 2022-11-08 (×2): qty 1

## 2022-11-08 MED ORDER — INSULIN PUMP
SUBCUTANEOUS | Status: DC
  Filled 2022-11-08: qty 1

## 2022-11-08 NOTE — Inpatient Diabetes Management (Signed)
Inpatient Diabetes Program Recommendations  AACE/ADA: New Consensus Statement on Inpatient Glycemic Control (2015)  Target Ranges:  Prepandial:   less than 140 mg/dL      Peak postprandial:   less than 180 mg/dL (1-2 hours)      Critically ill patients:  140 - 180 mg/dL   Lab Results  Component Value Date   GLUCAP 143 (H) 11/08/2022   HGBA1C 6.7 (H) 08/27/2022    Review of Glycemic Control  Diabetes history: DM1 Outpatient Diabetes medications: Basal: MN-1800 - 1.5               1800 - 1.6 - Total 36.9 units/24H CF - 20 with Goal of 110 ICR: 1:4 Current orders for Inpatient glycemic control: Discussion with Dr. Sharon Seller to leave patient on subcutaneous insulin. See below.  Inpatient Diabetes Program Recommendations:   I found patient's insulin pump in a belongings bag in patient's room. However, she is not remembering how to manage her pump and does not have her transmitter and G6 sensor with her. I can provide a G7 but won't connect to her pump. Patient has these items @ home but significant other said he doesn't know what items look like to get them. Also SO has his own surgery planned and won't be able to help administer injections or assist with care. States he thinks his daughter and son will be able to help. Significant other states concerns of patient being able to go home. Recommended patient and SO discuss with physician. RN Phillip Heal to allow patient to give own injections to evaluate ability to give injections on her own.  Thank you, Billy Fischer. , RN, MSN, CDE  Diabetes Coordinator Inpatient Glycemic Control Team Team Pager [phone] (8am-5pm) 11/08/2022 1:14 PM

## 2022-11-08 NOTE — Progress Notes (Signed)
RN attempted to go over insulin pen instructions with patient. Patient said she's not comfortable using it and would need her daughter or her son to help her. Significant other was at the bedside and he's unable to assist. RN asked patient to if either her daughter or her son could come in tomorrow so staff can go over instructions with them. She said she would call and ask them to come in.

## 2022-11-08 NOTE — Progress Notes (Addendum)
Physical Therapy Treatment Patient Details Name: Linda Ellison MRN: [id number] DOB: 02-08-1976 Today's Date: 11/08/2022   History of Present Illness 47 y.o. female presents to [facility] hospital on 10/09/2022 with CAP. Pt transferred to ICU on 7/9, intubated 7/10. HDU initiated on 7/15 due to fluid overload. Bronch 7/18. Extubated 10/26/2022. PMH includes renal transplant, DMII, HTN, HLD, OSA, CAD, hypothyroidism, gastroparesis, anxiety, depression.    PT Comments  Pt received in recliner, agreeable to therapy session and with good participation and fair tolerance for seated/standing exercises. Pt standing tolerance limited due to c/o fatigue and when orthostatic BP taken, pt with severe drop with MAP (117) seated to MAP (66) standing when knee-high compression socks not donned. With compression socks donned, MAP (113) reclined and (71) standing. Pt needing up to modA for sit<>stand and standing hip flexion/weight shifting at RW, pt with increased posterior lean today. Pt continues to benefit from PT services to progress toward functional mobility goals, and remains an excelletn candidate for short term moderate to high intensity post-acute rehab <3 hours/day, pt performed PT/OT sessions today and reports 5/10 modified RPE (fatigue) after second therapy session.   11/08/22 1330  Orthostatic Sitting (reclined)  BP- Sitting 169/89 (113)  Pulse- Sitting 101  Orthostatic Standing at 0 minutes  BP- Standing at 0 minutes (!) 86/61 (71)  Pulse- Standing at 0 minutes 113     If plan is discharge home, recommend the following: Assistance with cooking/housework;Direct supervision/assist for medications management;Assist for transportation;Help with stairs or ramp for entrance;A lot of help with walking and/or transfers;A lot of help with bathing/dressing/bathroom   Can travel by private vehicle        Equipment Recommendations  Wheelchair (measurements PT);Wheelchair cushion (measurements PT);Rolling walker (2  wheels);BSC/3in1    Recommendations for Other Services       Precautions / Restrictions Precautions Precautions: Fall Precaution Comments: LUE AV fistula, watch BP Restrictions Weight Bearing Restrictions: No     Mobility  Bed Mobility Overal bed mobility: Needs Assistance       Supine to sit: Min assist, HOB elevated     General bed mobility comments: use of rails to elevate trubk, mod verbla cues for LE mgt and to scoot forward at EOB, some posterior leaning    Transfers Overall transfer level: Needs assistance Equipment used: Rolling walker (2 wheels) Transfers: Sit to/from Stand, Bed to chair/wheelchair/BSC Sit to Stand: Mod assist, Min assist   Step pivot transfers: Min assist       General transfer comment: required momentum and 3 trials to achieve stand from EOB to RW    Ambulation/Gait Ambulation/Gait assistance: Min assist, Mod assist   Assistive device: Rolling walker (2 wheels)       Pre-gait activities: standing hip flexion x10 reps ea at RW, min to modA due to initial posterior LOB/lean upon standing. General Gait Details: defer due to symptomatic orthostatic hypotension, see comments above     Balance Overall balance assessment: Needs assistance Sitting-balance support: No upper extremity supported, Feet supported Sitting balance-Leahy Scale: Fair   Postural control: Posterior lean Standing balance support: Bilateral upper extremity supported, Reliant on assistive device for balance Standing balance-Leahy Scale: Poor Standing balance comment: modA to correct posterior LOB upon standing initially, improved on second standing trial but still with posterior LOB marching in place                            Cognition Arousal/Alertness: Awake/alert Behavior During  Therapy: Flat affect Overall Cognitive Status: Impaired/Different from baseline Area of Impairment: Safety/judgement, Awareness                          Safety/Judgement: Decreased awareness of safety, Decreased awareness of deficits Awareness: Emergent   General Comments: following commands, slow processing, decreased carryover of safe UE placement/technique from AM OT session.        Exercises General Exercises - Lower Extremity Long Arc Quad: AROM, Left, Right, 10 reps, Seated Hip Flexion/Marching: AROM, 10 reps, Left, Right, Seated Other Exercises Other Exercises: IS ~1250-1500 x10 reps, encouraged hourly use Other Exercises: STS x 3 reps from chair (w/rest breaks due to fatigue) Other Exercises: standing marches x10 reps ea w/RW    General Comments General comments (skin integrity, edema, etc.): see comments above for BP; symptomatic orthostatic hypotension; SpO2 99% on RA; Pt reports modified RPE 5/10 at end of session after seated/standing exercises      Pertinent Vitals/Pain Pain Assessment Pain Assessment: No/denies pain Faces Pain Scale: No hurt Pain Intervention(s): Monitored during session, Repositioned     PT Goals (current goals can now be found in the care plan section) Acute Rehab PT Goals Patient Stated Goal: Regain independence, mobilize PT Goal Formulation: With patient Time For Goal Achievement: 11/10/22 Progress towards PT goals: Progressing toward goals    Frequency    Min 1X/week      PT Plan Current plan remains appropriate       AM-PAC PT "6 Clicks" Mobility   Outcome Measure  Help needed turning from your back to your side while in a flat bed without using bedrails?: A Little Help needed moving from lying on your back to sitting on the side of a flat bed without using bedrails?: A Lot (without rail) Help needed moving to and from a bed to a chair (including a wheelchair)?: A Lot Help needed standing up from a chair using your arms (e.g., wheelchair or bedside chair)?: A Lot Help needed to walk in hospital room?: Total Help needed climbing 3-5 steps with a railing? : Total 6 Click  Score: 11    End of Session Equipment Utilized During Treatment: Gait belt Activity Tolerance: Patient tolerated treatment well;Other (comment);Treatment limited secondary to medical complications (Comment);Patient limited by fatigue (symptomatic orthostatic hypotension) Patient left: in chair;with call bell/phone within reach;with family/visitor present;Other (comment) (female family members x2 present) Nurse Communication: Mobility status;Other (comment) (orthostatic hypotension, she needs to wear TED hose) PT Visit Diagnosis: Other abnormalities of gait and mobility (R26.89);Muscle weakness (generalized) (M62.81);Difficulty in walking, not elsewhere classified (R26.2)     Time: 1308-6578 PT Time Calculation (min) (ACUTE ONLY): 28 min  Charges:    $Therapeutic Exercise: 8-22 mins $Therapeutic Activity: 8-22 mins PT General Charges $$ ACUTE PT VISIT: 1 Visit                      P., PTA Acute Rehabilitation Services Secure Chat Preferred 9a-5:30pm Office: [phone]    Dorathy Kinsman [facility] 11/08/2022, 2:08 PM

## 2022-11-08 NOTE — Progress Notes (Signed)
Linda Ellison  QMV:[id number] DOB: 01/20/1976 DOA: 10/09/2022 PCP: Daylene Katayama, PA    Brief Narrative:  47 year old with a history of renal transplant who was admitted to the hospital 7/6 with a multifocal pneumonia and ultimately required mechanical ventilation.  Her hospitalization was complicated by acute renal failure which transiently required dialysis, but her renal function subsequently improved.  She completed antibiotics for her pneumonia, was successfully extubated, and weaned to room air as of 7/26.  She was transferred to the hospitalist service 7/26.  Significant Events: 7/6 admission 7/9 transferred to ICU 7/10 intubated 7/15 worsening renal function with fluid overload -hemodialysis initiated 7/17 endotracheal tube exchange due to mucous plugging 7/18 emergent bronchoscopy for mucous plugging 7/19 Cleviprex required due to severe hypertension -repeat episode of mucous plugging 7/22 weaned on pressure support with copious secretions noted 7/23 extubated but required BiPAP 7/24 Cleviprex drip resumed 7/26 transferred to [facility] service -awaiting rehab placement  Goals of Care:   Code Status: Full Code   DVT prophylaxis: Place TED hose Start: 11/03/22 1629 heparin injection 5,000 Units Start: 10/10/22 0600 SCDs Start: 10/10/22 0105  Interim Hx: During rounds I found that the patient had been moved to a different room.  When I asked her about this she and her significant other explained to me that they had been complaining about "black mold" in the bathroom of her other hospital room for "many days" and that she was finally moved to a different room.  They expressed significant concern that the mold/mildew in her bathroom was the cause of her ongoing coughing and concerned that it may have had a deleterious effect on her overall health.  We discussed this and I agreed to obtain a chest x-ray to help reassure them that her respiratory status was stable.  We also agreed to  initiate an antihistamine in case she was having an allergic type response as a result.  Otherwise she has no new complaints today.  She persisted and her insistence on going home even though her significant other and spent a good bit of time again today telling her that we did not feel she was safe to do so.  The patient tells me she is normally on an insulin pump at home, and would like to resume her insulin pump.  Unfortunately when meeting with the diabetes coordinator it became clear that she does not remember how to work her pump nor does she have all the equipment with her that is needed to operate it properly.  Assessment & Plan:  Acute hypoxic respiratory failure due to ARDS complicated by frequent mucous plugging Now resolved with patient successfully weaned to room air  Aspiration pneumonitis Occurred during the night 7/30-7/31 - appears to have been a self-limited minor event -no recurrence  Orthostatic hypotension Likely related to the addition of multiple new blood pressure medications during this hospital stay, to include BiDil which was newly initiated on the day her symptoms developed -blood pressure regimen simplified -there may be a need to tolerate higher blood pressure transiently until outpatient follow-up - counseled patient on need to make very slow positional changes and to be cognizant of potential for syncope - continue bilateral lower extremity venous compression stockings - improving overall with increasing mobility and minimizing of blood pressure medications  Multifocal pneumonia in immunosuppressed patient Has completed a course of antibiotic therapy -no clinical signs of active infection at present  Acute renal failure of a transplanted kidney Fortunately this has resolved with the patient's  renal function recovering - she did require dialysis during this hospital stay - continue her usual immunosuppressive medications - creatinine presently normal  Resistant  hypertension Elevated blood pressure continues to be an issue -as noted above she has not tolerated further titration of blood pressure medication due to significant orthostasis - at this time we are having to accept elevated blood pressure readings to avoid orthostasis that occurs when upward titration of blood pressure medications is attempted   Hypomagnesemia Supplementing   DM2 CBG well controlled at present time  Septic shock - resolved Due to above  Acute metabolic encephalopathy due to multifocal pneumonia - resolved  Generalized weakness/deconditioning The recommendation by therapy has been for inpatient rehab but the patient is out of network for our in-house rehab unit - the possibility of inpatient rehab in [facility] was investigated but the patient informed me that she is not interested in this - she feels she has improved to the point that she can attend to her needs at home with help from her family - in my opinion she remains too weak to go home as per discussion above, but she has now medically stabilized and needs to make a decision as soon as possible because we are beginning to lose ground in regard to rehab in that her rehab needs are higher than what can be provided in the acute care setting  Environmental exposure to bathroom mold/mildew No evidence at present of a significant hypersensitivity type reaction -standard dose antihistamine   Family Communication: Spoke with significant other at bedside Disposition: Anticipate discharge home 8/5 at patient request after refusing inpatient rehab   Objective: Blood pressure (!) 175/90, pulse 93, temperature 98.5 F (36.9 C), resp. rate 18, height 5\' 6"  (1.676 m), weight 99.2 kg, SpO2 93%.  Intake/Output Summary (Last 24 hours) at 11/08/2022 1606 Last data filed at 11/07/2022 2245 Gross per 24 hour  Intake --  Output 400 ml  Net -400 ml   Filed Weights   11/03/22 0826 11/04/22 0622 11/08/22 0421  Weight: 104.3 kg  105.3 kg 99.2 kg    Examination: General: No acute respiratory distress Lungs: Clear to auscultation bilaterally with no appreciable wheezing Cardiovascular: RRR without murmur or rub Abdomen: NT/ND, soft, BS positive, no rebound Extremities: Trace edema BLE without significant change  CBC: Recent Labs  Lab 11/02/22 0036 11/03/22 0319 11/04/22 0106  WBC 5.5 5.1 5.3  NEUTROABS  --  2.9  --   HGB 10.4* 9.2* 9.1*  HCT 34.8* 29.6* 29.4*  MCV 88.1 84.8 85.2  PLT 211 262 230   Basic Metabolic Panel: Recent Labs  Lab 11/02/22 0036 11/02/22 0430 11/03/22 0319 11/04/22 0106  NA  --  138 135 137  K  --  3.8 3.6 3.8  CL  --  104 104 106  CO2  --  18* 19* 17*  GLUCOSE  --  242* 176* 215*  BUN  --  10 9 10   CREATININE  --  0.85 0.74 0.89  CALCIUM  --  8.7* 8.7* 8.8*  MG  --  1.3* 1.6*  --   PHOS 4.5  --  4.5  --    GFR: Estimated Creatinine Clearance: 93.9 mL/min (by C-G formula based on SCr of 0.89 mg/dL).   Scheduled Meds:  ARIPiprazole  30 mg Oral Daily   atorvastatin  10 mg Oral Daily   buPROPion  225 mg Oral BID   folic acid  1 mg Oral Daily   heparin  5,000  Units Subcutaneous Q8H   hydrOXYzine  25 mg Oral QHS   insulin aspart  0-20 Units Subcutaneous TID WC   insulin aspart  0-5 Units Subcutaneous QHS   insulin aspart  4 Units Subcutaneous TID WC   insulin detemir  10 Units Subcutaneous BID   insulin starter kit- pen needles  1 kit Other Once   labetalol  300 mg Oral BID   loratadine  10 mg Oral Daily   losartan  100 mg Oral Daily   montelukast  10 mg Oral QHS   multivitamin with minerals  1 tablet Oral Daily   mycophenolate  720 mg Oral BID   pantoprazole  40 mg Oral Daily   prazosin  1 mg Oral QHS   predniSONE  5 mg Oral Q breakfast   spironolactone  50 mg Oral Daily   sulfamethoxazole-trimethoprim  1 tablet Oral Once per day on Monday Wednesday Friday   tacrolimus  4 mg Oral BID      LOS: 29 days   Lonia Blood, MD [facility] Office  [phone] Pager - Text Page per Loretha Stapler  If 7PM-7AM, please contact night-coverage per Amion 11/08/2022, 4:06 PM

## 2022-11-08 NOTE — Progress Notes (Signed)
Occupational Therapy Treatment Patient Details Name: SHALA KOLASINSKI MRN: [id number] DOB: [birth year] Today's Date: 11/08/2022   History of present illness 47 y.o. female presents to [facility] hospital on 10/09/2022 with CAP. Pt transferred to ICU on 7/9, intubated 7/10. HDU initiated on 7/15 due to fluid overload. Bronch 7/18. Extubated 10/26/2022. PMH includes renal transplant, DMII, HTN, HLD, OSA, CAD, hypothyroidism, gastroparesis, anxiety, depression.   OT comments  Pt making progress with functional goals. Pt sat EOB with min A and with use of rails to elevate trunk, mod verbal cues for LE mgt and to scoot forward at EOB, some posterior leaning. Sit - stand from EOB mod A requiring 3 trials to power up, SPT to recliner min A. Pt seated in recliner for grooming/hygiene tasks mi guard  and UB ADLs min A. OT will continue to follow acutely to maximize level of function and safety   Recommendations for follow up therapy are one component of a multi-disciplinary discharge planning process, led by the attending physician.  Recommendations may be updated based on patient status, additional functional criteria and insurance authorization.    Assistance Recommended at Discharge Frequent or constant Supervision/Assistance  Patient can return home with the following  Assistance with cooking/housework;Help with stairs or ramp for entrance;Assist for transportation;Assistance with feeding;A lot of help with walking and/or transfers;A lot of help with bathing/dressing/bathroom   Equipment Recommendations   (TBD)    Recommendations for Other Services      Precautions / Restrictions Precautions Precautions: Fall Precaution Comments: LUE AV fistula, watch BP Restrictions Weight Bearing Restrictions: No       Mobility Bed Mobility Overal bed mobility: Needs Assistance       Supine to sit: Min assist, HOB elevated     General bed mobility comments: use of rails to elevate trunk, mod verbal cues for LE  mgt and to scoot forward at EOB, some posterior leaning    Transfers Overall transfer level: Needs assistance Equipment used: Rolling walker (2 wheels) Transfers: Sit to/from Stand, Bed to chair/wheelchair/BSC Sit to Stand: Mod assist, Min assist     Step pivot transfers: Min assist     General transfer comment: required momentum and 3 trials to achieve stand from EOB to RW     Balance Overall balance assessment: Needs assistance Sitting-balance support: No upper extremity supported, Feet supported Sitting balance-Leahy Scale: Fair Sitting balance - Comments: posterior LOB while performing self-care tasks at EOB Postural control: Posterior lean Standing balance support: Bilateral upper extremity supported, Reliant on assistive device for balance Standing balance-Leahy Scale: Poor                             ADL either performed or assessed with clinical judgement   ADL Overall ADL's : Needs assistance/impaired     Grooming: Wash/dry hands;Wash/dry face;Min guard;Sitting   Upper Body Bathing: Minimal assistance Upper Body Bathing Details (indicate cue type and reason): simulated seated EOB     Upper Body Dressing : Minimal assistance;Sitting Upper Body Dressing Details (indicate cue type and reason): donn gown to cover back     Toilet Transfer: Minimal assistance;Rolling walker (2 wheels);Cueing for Chief of Staff Details (indicate cue type and reason): simulated to recliner Toileting- Clothing Manipulation and Hygiene: Maximal assistance       Functional mobility during ADLs: Moderate assistance;Minimal assistance;Rolling walker (2 wheels);Cueing for safety      Extremity/Trunk Assessment Upper Extremity Assessment Upper Extremity Assessment: Generalized weakness  Lower Extremity Assessment Lower Extremity Assessment: Defer to PT evaluation   Cervical / Trunk Assessment Cervical / Trunk Assessment: Other exceptions    Vision Baseline  Vision/History: 0 No visual deficits Ability to See in Adequate Light: 0 Adequate Patient Visual Report: No change from baseline     Perception     Praxis      Cognition Arousal/Alertness: Awake/alert Behavior During Therapy: Flat affect Overall Cognitive Status: Impaired/Different from baseline Area of Impairment: Safety/judgement, Awareness                                        Exercises      Shoulder Instructions       General Comments      Pertinent Vitals/ Pain       Pain Assessment Pain Assessment: No/denies pain Faces Pain Scale: No hurt Pain Intervention(s): Monitored during session, Repositioned  Home Living                                          Prior Functioning/Environment              Frequency  Min 2X/week        Progress Toward Goals  OT Goals(current goals can now be found in the care plan section)  Progress towards OT goals: Progressing toward goals     Plan Discharge plan remains appropriate    Co-evaluation                 AM-PAC OT "6 Clicks" Daily Activity     Outcome Measure   Help from another person eating meals?: None Help from another person taking care of personal grooming?: A Little Help from another person toileting, which includes using toliet, bedpan, or urinal?: A Lot Help from another person bathing (including washing, rinsing, drying)?: A Lot Help from another person to put on and taking off regular upper body clothing?: A Little Help from another person to put on and taking off regular lower body clothing?: A Lot 6 Click Score: 16    End of Session Equipment Utilized During Treatment: Gait belt;Rolling walker (2 wheels)  OT Visit Diagnosis: Unsteadiness on feet (R26.81);Muscle weakness (generalized) (M62.81)   Activity Tolerance Patient tolerated treatment well   Patient Left with call bell/phone within reach;in chair;with family/visitor present   Nurse  Communication          Time: 7564-3329 OT Time Calculation (min): 19 min  Charges: OT General Charges $OT Visit: 1 Visit OT Treatments $Therapeutic Activity: 8-22 mins    Galen Manila 11/08/2022, 1:46 PM

## 2022-11-09 ENCOUNTER — Inpatient Hospital Stay (HOSPITAL_COMMUNITY): Payer: Medicare (Managed Care)

## 2022-11-09 DIAGNOSIS — J189 Pneumonia, unspecified organism: Secondary | ICD-10-CM | POA: Diagnosis not present

## 2022-11-09 LAB — GLUCOSE, CAPILLARY
Glucose-Capillary: 140 mg/dL — ABNORMAL HIGH (ref 70–99)
Glucose-Capillary: 147 mg/dL — ABNORMAL HIGH (ref 70–99)
Glucose-Capillary: 129 mg/dL — ABNORMAL HIGH (ref 70–99)
Glucose-Capillary: 103 mg/dL — ABNORMAL HIGH (ref 70–99)

## 2022-11-09 MED ORDER — MAGNESIUM SULFATE 4 GM/100ML IV SOLN
4.0000 g | Freq: Once | INTRAVENOUS | Status: AC
  Administered 2022-11-09: 4 g via INTRAVENOUS
  Filled 2022-11-09: qty 100

## 2022-11-09 NOTE — Plan of Care (Signed)
  Problem: Skin Integrity: Goal: Risk for impaired skin integrity will decrease Outcome: Progressing   Problem: Tissue Perfusion: Goal: Adequacy of tissue perfusion will improve Outcome: Progressing   Problem: Clinical Measurements: Goal: Ability to maintain clinical measurements within normal limits will improve Outcome: Progressing Goal: Will remain free from infection Outcome: Progressing Goal: Diagnostic test results will improve Outcome: Progressing Goal: Respiratory complications will improve Outcome: Progressing Goal: Cardiovascular complication will be avoided Outcome: Progressing

## 2022-11-09 NOTE — Progress Notes (Signed)
Physical Therapy Treatment Patient Details Name: Linda Ellison MRN: [id number] DOB: 1975-10-24 Today's Date: 11/09/2022   History of Present Illness 47 y.o. female presents to [facility] on 10/09/2022 with CAP. Pt transferred to ICU on 7/9, intubated 7/10. HDU initiated on 7/15 due to fluid overload. Bronch 7/18. Extubated 10/26/2022. PMH includes renal transplant, DMII, HTN, HLD, OSA, CAD, hypothyroidism, gastroparesis, anxiety, depression.    PT Comments  Pt received sitting EOB with NT present finishing bed bath, pt agreeable to therapy session with fair tolerance for transfer training and instruction on seated BLE exercises. Pt given HEP handout to reinforce supine/standing LE exercises and PTA encouraged compliance to optimize strength/endurance for short term rehab. Pt was not able to indicate to nursing staff that she needed to don her BLE compression socks prior to OOB transfers, PTA and MD reinforcing with pt/family and nursing staff need for her to don these prior to standing transfers due to her history of orthostatic hypotension which is even more severe without LE compression. Pt continues to benefit from PT services to progress toward functional mobility goals, long discussion during session (MD present for part of session) on need to participate in short term rehab to progress her strength given deconditioning, pt now reporting she is agreeable to lower intensity post-acute therapies <3 hours/day if she can go somewhere near home, discussed with supervising PT Ryan L and updated below.     If plan is discharge home, recommend the following: Assistance with cooking/housework;Direct supervision/assist for medications management;Assist for transportation;Help with stairs or ramp for entrance;A lot of help with walking and/or transfers;A lot of help with bathing/dressing/bathroom   Can travel by private vehicle     No  Equipment Recommendations  Wheelchair (measurements PT);Wheelchair  cushion (measurements PT);Rolling walker (2 wheels);BSC/3in1    Recommendations for Other Services       Precautions / Restrictions Precautions Precautions: Fall Precaution Comments: LUE AV fistula, watch BP Restrictions Weight Bearing Restrictions: No     Mobility  Bed Mobility Overal bed mobility: Needs Assistance             General bed mobility comments: Pt received sitting EOB with NT in room assisting her with a bath    Transfers Overall transfer level: Needs assistance Equipment used: 2 person hand held assist (face to face with NT, PTA standing by for safety) Transfers: Sit to/from Stand, Bed to chair/wheelchair/BSC Sit to Stand: Mod assist, From elevated surface, Min assist   Step pivot transfers: Mod assist       General transfer comment: Face to face with NT assist, HHA to pivot from bed>chair during bed bath/linen change with modA, PTA standing by for safety in case of buckling. Pt then stood from chair after PTA assisted her to don compression socks with minA, but buckling after a few seconds and on second attempt, needing minA lift assist and min/modA steadying assist at RW prior to standing BP assessment.    Ambulation/Gait Ambulation/Gait assistance: Min assist   Assistive device: Rolling walker (2 wheels)       Pre-gait activities: standing weight shifts ~3 reps, limited due to pt lightheadedness symptoms and soft BP standing General Gait Details: defer due to symptomatic orthostatic hypotension, see comments above   Stairs             Wheelchair Mobility     Tilt Bed    Modified Rankin (Stroke Patients Only)       Balance Overall balance assessment: Needs assistance Sitting-balance support:  No upper extremity supported, Feet supported Sitting balance-Leahy Scale: Fair   Postural control: Posterior lean Standing balance support: Bilateral upper extremity supported, Reliant on assistive device for balance Standing balance-Leahy  Scale: Poor Standing balance comment: min/modA for static standing at RW, pt lightheaded and did have posterior LOB (vs LE fatigue/buckling) standing only a few seconds on first attempt, then standing ~2 mins second attempt                            Cognition Arousal/Alertness: Awake/alert Behavior During Therapy: Flat affect Overall Cognitive Status: Impaired/Different from baseline Area of Impairment: Safety/judgement, Awareness                         Safety/Judgement: Decreased awareness of safety, Decreased awareness of deficits Awareness: Emergent   General Comments: Following commands, slow processing. Pt with new visitor in room (maternal aunt) and appears slightly more anxious with MD/Aunt discussing disposition plan during therapies this date. Pt participatory as able.         Exercises General Exercises - Lower Extremity Ankle Circles/Pumps: AROM, Both, 5 reps, Supine Long Arc Quad: 5 reps, Seated, AROM, AAROM, Right, Left Hip Flexion/Marching: AROM, Left, Right, Seated, 5 reps Other Exercises Other Exercises: static standing x2 mins (during BP assessment) Other Exercises: Reviewed seated/supine BLE exercises and handout given to reinforce with frequency written on it. Pt and aunt receptive. Other Exercises: verbal review for reciprocal STS x5 reps when orthostatic for strengthening if unable to safely walk, pt too fatigued after third standing trial and requesting to rest (RN to room to give meds also)    General Comments        Pertinent Vitals/Pain Pain Assessment Pain Assessment: No/denies pain Faces Pain Scale: No hurt Pain Intervention(s): Monitored during session, Repositioned     PT Goals (current goals can now be found in the care plan section) Acute Rehab PT Goals Patient Stated Goal: Regain independence, mobilize PT Goal Formulation: With patient Time For Goal Achievement: 11/10/22 Progress towards PT goals: Progressing toward  goals    Frequency    Min 1X/week      PT Plan Current plan remains appropriate       AM-PAC PT "6 Clicks" Mobility   Outcome Measure  Help needed turning from your back to your side while in a flat bed without using bedrails?: A Little Help needed moving from lying on your back to sitting on the side of a flat bed without using bedrails?: A Lot (without rail) Help needed moving to and from a bed to a chair (including a wheelchair)?: A Lot Help needed standing up from a chair using your arms (e.g., wheelchair or bedside chair)?: A Lot Help needed to walk in hospital room?: Total Help needed climbing 3-5 steps with a railing? : Total 6 Click Score: 11    End of Session Equipment Utilized During Treatment: Gait belt Activity Tolerance: Patient tolerated treatment well;Other (comment);Treatment limited secondary to medical complications (Comment);Patient limited by fatigue (symptomatic orthostatic hypotension) Patient left: in chair;with call bell/phone within reach;with family/visitor present;Other (comment) (aunt in room from [place]) Nurse Communication: Mobility status;Other (comment) (orthostatic hypotension, she needs to wear LE compression if standing or transferring OOB) PT Visit Diagnosis: Other abnormalities of gait and mobility (R26.89);Muscle weakness (generalized) (M62.81);Difficulty in walking, not elsewhere classified (R26.2)     Time: 9563-8756 PT Time Calculation (min) (ACUTE ONLY): 26 min  Charges:    $  Therapeutic Exercise: 8-22 mins $Therapeutic Activity: 8-22 mins PT General Charges $$ ACUTE PT VISIT: 1 Visit                      P., PTA Acute Rehabilitation Services Secure Chat Preferred 9a-5:30pm Office: [phone]    Dorathy Kinsman [facility] 11/09/2022, 2:36 PM

## 2022-11-09 NOTE — Inpatient Diabetes Management (Signed)
Inpatient Diabetes Program Recommendations  AACE/ADA: New Consensus Statement on Inpatient Glycemic Control (2015)  Target Ranges:  Prepandial:   less than 140 mg/dL      Peak postprandial:   less than 180 mg/dL (1-2 hours)      Critically ill patients:  140 - 180 mg/dL   Lab Results  Component Value Date   GLUCAP 147 (H) 11/09/2022   HGBA1C 6.7 (H) 08/27/2022   Inpatient Diabetes Program Recommendations:   Attempted to call contact Joellyn Quails. Listed as son [phone] and left a message to call back regarding coming in to learn how to give insulin injections. Spoke with significant other again today to ask for him to reach out to his daughter and son to request them to come in for teaching so they can assist patient @ home. Significant other agrees to call son and daughter.  Thank you, Billy Fischer. , RN, MSN, CDE  Diabetes Coordinator Inpatient Glycemic Control Team Team Pager [phone] (8am-5pm) 11/09/2022 12:25 PM

## 2022-11-09 NOTE — TOC Progression Note (Signed)
Transition of Care [facility]) - Progression Note    Patient Details  Name: Linda Ellison MRN: [id number] Date of Birth: 20-Jun-1975  Transition of Care [facility]) CM/SW Contact  Tom-Johnson, Hershal Coria, RN Phone Number: 11/09/2022, 1:55 PM  Clinical Narrative:     CM called to patient's room by aunt Myra requesting SNF or other alternative to [place] [facility] as a disposition at discharge instead of patient going home.  Patient sitting up in chair at bedside in agreement.  Patient had initially declined going to [facility], requesting to go home and Home Health referral established with info on AVS.  MD, PT and LCSW notified.   CM will continue to follow.       Expected Discharge Plan and Services                                               Social Determinants of Health (SDOH) Interventions SDOH Screenings   Food Insecurity: No Food Insecurity (11/07/2022)  Housing: Medium Risk (11/07/2022)  Transportation Needs: No Transportation Needs (11/07/2022)  Utilities: Not At Risk (11/07/2022)  Alcohol Screen: Low Risk  (11/21/2017)  Financial Resource Strain: Low Risk  (05/13/2021)   [facility], [facility] to [facility]22[facility] visits prior to 06/05/2022.  Physical Activity: Unknown (05/13/2021)   Received from [facility], [facility] prior to [facility]/05/2022., [facility] visits prior to 06/05/2022.  Social Connections: Unknown (08/16/2021)   Received from [facility], [facility]  Stress: No Stress Concern Present (05/13/2021)   [facility], [facility] prior to [facility]/05/2022[facility], [facility] visits prior to 06/05/2022.  Tobacco Use: Low Risk  (10/10/2022)    Readmission Risk Interventions    10/11/2022    3:46 PM  Readmission Risk Prevention Plan   Transportation Screening Complete  Medication Review (RN Care Manager) Referral to Pharmacy  PCP or Specialist appointment within 3-5 days of discharge Complete  HRI or Home Care Consult Complete  SW Recovery Care/Counseling Consult Complete  Palliative Care Screening Not Applicable  Skilled Nursing Facility Not Applicable

## 2022-11-09 NOTE — Progress Notes (Signed)
Linda Ellison  ZOX:[id number] DOB: Dec 03, 1975 DOA: 10/09/2022 PCP: Daylene Katayama, PA    Brief Narrative:  47 year old with a history of renal transplant who was admitted to the hospital 7/6 with a multifocal pneumonia and ultimately required mechanical ventilation.  Her hospitalization was complicated by acute renal failure which transiently required dialysis, but her renal function subsequently improved.  She completed antibiotics for her pneumonia, was successfully extubated, and weaned to room air as of 7/26.  She was transferred to the hospitalist service 7/26.  Significant Events: 7/6 admission 7/9 transferred to ICU 7/10 intubated 7/15 worsening renal function with fluid overload -hemodialysis initiated 7/17 endotracheal tube exchange due to mucous plugging 7/18 emergent bronchoscopy for mucous plugging 7/19 Cleviprex required due to severe hypertension -repeat episode of mucous plugging 7/22 weaned on pressure support with copious secretions noted 7/23 extubated but required BiPAP 7/24 Cleviprex drip resumed 7/26 transferred to [facility] service -awaiting rehab placement  Goals of Care:   Code Status: Full Code   DVT prophylaxis: Place TED hose Start: 11/03/22 1629 heparin injection 5,000 Units Start: 10/10/22 0600 SCDs Start: 10/10/22 0105  Interim Hx: The patient's aunt was at the bedside today.  The patient her aunt and I had a very long discussion concerning her medical condition.  I again explained that I felt she was not safe for discharge home and that this was not the best environment for her but if she continues to refuse rehab placement this would be my only option for discharge.  I also explained that ongoing inpatient hospitalization was counterproductive as the patient was not able to get the amount of rehab that she needs while she is inpatient in the acute hospital.  After our discussion the patient reported that she still was not willing to be admitted to the  residential rehab unit in [facility], but that instead she would agree to placement within the [facility] for a rehab stay.  She continues to have some orthostasis, though with discontinuation of some potential offending medications this is improving.  She continues to have elevated blood pressure, but at present time we are choosing to tolerate this to avoid severe orthostasis.  She frequently chooses not to wear compression stockings and has been reeducated on the absolute need to wear these every day.  Assessment & Plan:  Acute hypoxic respiratory failure due to ARDS complicated by frequent mucous plugging Now resolved with patient successfully weaned to room air  Aspiration pneumonitis Occurred during the night 7/30-7/31 - appears to have been a self-limited minor event -no recurrence with patient stable from a respiratory standpoint  Orthostatic hypotension Likely related to the addition of multiple new blood pressure medications during this hospital stay, as well as some of her other chronic meds that are known to cause orthostasis -blood pressure regimen simplified -there may be a need to tolerate higher blood pressure transiently until outpatient follow-up - counseled patient on need to make very slow positional changes and to be cognizant of potential for syncope - continue bilateral lower extremity venous compression stockings and explained need for strict compliance to patient - improving overall with increasing mobility and minimizing of blood pressure medications (initially, systolic noted to drop form 160 to 60s - today dropped from 148 to 106 systolic) - continue to minimize meds as able - cortisol has been checked and was not low - maximize electrolytes - suspect more consistent mobility will also improve this condition, as patient has now been hospitalized for 30 days  Multifocal pneumonia in immunosuppressed patient Has completed a course of antibiotic therapy -no clinical  signs of active infection at present  Acute renal failure of a transplanted kidney  Fortunately this has resolved with the patient's renal function recovering - she did require dialysis during this hospital stay - continue her usual immunosuppressive medications - creatinine presently normal  Resistant hypertension Elevated blood pressure continues to be an issue -as noted above she has not tolerated further titration of blood pressure medication due to significant orthostasis - at this time we are having to accept elevated blood pressure readings to avoid orthostasis that occurs when upward titration of blood pressure medications is attempted - once patient becomes more mobile and when her orthostasis improves, further BP med titration should be possible   Hypomagnesemia Severe and recurrent - supplementing via IV today - recheck in AM - encouraged more consistent oral intake   DM2 CBG well controlled at present time - pt was reportedly on an insulin pump at home prior to this admit - an attempt was made to resume her insulin pump this week, but patient told the DM Coordinator that she did not remember how to operate the pump - for now I feel it is safer to continue with insulin injections instead   Septic shock - resolved Due to above  Acute metabolic encephalopathy due to multifocal pneumonia Her encephalopathy per se has resolved, but she has a child-like affect and appears to be having great difficulty grasping concepts that are explained to her in simple terms on a recurring basis - given the severity of her illness at presentation, I will check a CT head to assure she did not suffer an ischemic insult at the onset of her illness   Generalized weakness/deconditioning The recommendation by therapy has been for inpatient rehab but the patient is out of network for our in-house rehab unit - the possibility of inpatient rehab in [facility] was investigated but the patient continues to inform  me that she is not interested in this - she feels she has improved to the point that she can attend to her needs at home with help from her family - in my opinion she remains much too weak to go home as per discussion above, but she has now medically stabilized and needs to make a decision as soon as possible because we are beginning to lose ground in regard to rehab in that her rehab needs are higher than what can be provided in the acute care setting - as of today she has agreed to a SNF rehab stay instead of CIR - I feel this is a reasonably compromise, though not ideal - the search for a SNF bed has begun   Environmental exposure to bathroom mold/mildew No evidence at present of a significant hypersensitivity type reaction -standard dose antihistamine being administered for a 5 day course - I have encouraged her that I do not expect her to experience any long term health effects due to her exposure to the signif mold/mildew present in the shower of her former hospital room    Family Communication: Spoke with aunt at bedside at length  Disposition: d/c to SNF rehab bed when bed available    Objective: Blood pressure (!) 186/98, pulse 97, temperature 97.7 F (36.5 C), resp. rate 18, height 5\' 6"  (1.676 m), weight 98.7 kg, SpO2 97%.  Intake/Output Summary (Last 24 hours) at 11/09/2022 0950 Last data filed at 11/08/2022 2133 Gross per 24 hour  Intake --  Output 300 ml  Net -300 ml   Filed Weights   11/04/22 0622 11/08/22 0421 11/09/22 0523  Weight: 105.3 kg 99.2 kg 98.7 kg    Examination: General: No acute respiratory distress - alert - affect flat  Lungs: Clear to auscultation bilaterally - no wheezing - good air movement th/o  Cardiovascular: RRR without murmur or rub Abdomen: NT/ND, soft, BS positive, no rebound Extremities: Trace edema BLE without significant change - compression stockings placed by PT during my visit   CBC: Recent Labs  Lab 11/03/22 0319 11/04/22 0106  11/09/22 0428  WBC 5.1 5.3 5.8  NEUTROABS 2.9  --   --   HGB 9.2* 9.1* 10.1*  HCT 29.6* 29.4* 32.9*  MCV 84.8 85.2 83.7  PLT 262 230 382   Basic Metabolic Panel: Recent Labs  Lab 11/03/22 0319 11/04/22 0106 11/09/22 0428  NA 135 137 136  K 3.6 3.8 4.1  CL 104 106 106  CO2 19* 17* 17*  GLUCOSE 176* 215* 135*  BUN 9 10 11   CREATININE 0.74 0.89 1.00  CALCIUM 8.7* 8.8* 9.0  MG 1.6*  --  1.2*  PHOS 4.5  --   --    GFR: Estimated Creatinine Clearance: 83.3 mL/min (by C-G formula based on SCr of 1 mg/dL).   Scheduled Meds:  atorvastatin  10 mg Oral Daily   buPROPion  225 mg Oral BID   folic acid  1 mg Oral Daily   heparin  5,000 Units Subcutaneous Q8H   hydrOXYzine  25 mg Oral QHS   insulin aspart  0-20 Units Subcutaneous TID WC   insulin aspart  0-5 Units Subcutaneous QHS   insulin aspart  4 Units Subcutaneous TID WC   insulin detemir  10 Units Subcutaneous BID   insulin starter kit- pen needles  1 kit Other Once   labetalol  300 mg Oral BID   loratadine  10 mg Oral Daily   losartan  100 mg Oral Daily   montelukast  10 mg Oral QHS   multivitamin with minerals  1 tablet Oral Daily   mycophenolate  720 mg Oral BID   pantoprazole  40 mg Oral Daily   predniSONE  5 mg Oral Q breakfast   spironolactone  50 mg Oral Daily   sulfamethoxazole-trimethoprim  1 tablet Oral Once per day on Monday Wednesday Friday   tacrolimus  4 mg Oral BID      LOS: 30 days   Lonia Blood, MD [facility] Office  [phone] Pager - Text Page per Loretha Stapler  If 7PM-7AM, please contact night-coverage per Amion 11/09/2022, 9:50 AM

## 2022-11-10 DIAGNOSIS — J189 Pneumonia, unspecified organism: Secondary | ICD-10-CM | POA: Diagnosis not present

## 2022-11-10 LAB — GLUCOSE, CAPILLARY
Glucose-Capillary: 201 mg/dL — ABNORMAL HIGH (ref 70–99)
Glucose-Capillary: 81 mg/dL (ref 70–99)
Glucose-Capillary: 194 mg/dL — ABNORMAL HIGH (ref 70–99)
Glucose-Capillary: 146 mg/dL — ABNORMAL HIGH (ref 70–99)

## 2022-11-10 NOTE — Progress Notes (Signed)
**Note De-Identified vi Obfusction** PROGRESS NOTE    Linda Ellison  WUJ:[id number] DOB: 06-Dec-1975 DOA: 10/09/2022 PCP: Dylene Ktym, PA  Chief Complint  Ptient presents with   Chest Pin    Brief Nrrtive:   28 yer old with  history of renl trnsplnt who ws dmitted to the hospitl 7/6 with  multifocl pneumoni nd ultimtely required mechnicl ventiltion.  Her hospitliztion ws complicted by cute renl filure which trnsiently required dilysis, but her renl function subsequently improved.  She completed ntibiotics for her pneumoni, ws successfully extubted, nd wened to room ir s of 7/26.  She ws trnsferred to the hospitlist service 7/26.   Significnt Events 7/6 dmission 7/9 trnsferred to ICU 7/10 intubted 7/15 worsening renl function with fluid overlod -hemodilysis initited 7/17 endotrchel tube exchnge due to mucous plugging 7/18 emergent bronchoscopy for mucous plugging 7/19 Cleviprex required due to severe hypertension -repet episode of mucous plugging 7/22 wened on pressure support with copious secretions noted 7/23 extubted but required BiPAP 7/24 Cleviprex drip resumed 7/26 trnsferred to [facility] service -witing rehb plcement Assessment & Pln:   Principl Problem:   Multifocl pneumoni Active Problems:   AKI (cute kidney injury) of trnsplnted kidney (HCC)   Hypoklemi   Acute hypoxic respirtory filure (HCC)   Asthm, chronic, unspecified sthm severity, with cute excerbtion   GERD (gstroesophgel reflux disese)   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)   History of CAD (coronry rtery disese)   CAD (coronry rtery disese)   DM (dibetes 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 (HCC)   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)   Chronic nemi   Metbolic cidosis   Immunosuppressed sttus (HCC)   Essentil hypertension   Peripherl neuropthy   Generlized nxiety disorder   Hypoclcemi   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)   Septic shock (HCC)  Acute hypoxic respirtory filure due to ARDS complicted by frequent mucous plugging Now resolved with ptient successfully wened to room ir   Aspirtion pneumonitis Occurred during the night 7/30-7/31 - ppers to hve been  self-limited minor event -no recurrence with ptient stble from  respirtory stndpoint   Orthosttic hypotension there my be  need to tolerte higher blood pressure trnsiently until outptient follow-up - counseled ptient on need to mke very slow positionl chnges nd to be cogniznt of potentil for syncope  Continue bilterl lower extremity venous compression stockings    Multifocl pneumoni in immunosuppressed ptient Hs completed  course of ntibiotic therpy -no clinicl signs of ctive infection t present   Acute renl filure of  trnsplnted kidney  Fortuntely this hs resolved with the ptient's renl function recovering - she did require dilysis during this hospitl sty - continue her usul immunosuppressive medictions - cretinine presently norml   Resistnt hypertension Elevted blood pressure continues to be n issue -s noted bove she hs not tolerted further titrtion of blood pressure mediction due to significnt orthostsis - t this time we re hving to ccept elevted blood pressure redings to void orthostsis tht occurs when upwrd titrtion of blood pressure medictions is ttempted - once ptient becomes more mobile nd when her orthostsis improves, further BP med titrtion should be possible    Hypomgnesemi Replce nd follow   DM2  ptient told the DM Coordintor tht she did not remember how to operte the pump Now using bsl/bolus insulin   Septic shock - resolved Due to bove   Acute metbolic encephlopthy due to multifocl pneumoni Hed CT without cute  abnormality Normal TSH Elevated B12 (10/2022) Follow ammonia, follow  b1, follow RPR Will refer to neurology outpatient for cognitive decline, consider MRI (either outpatient or inpatient) - aunt feels she's gradually improving, will hold off   Generalized weakness/deconditioning The recommendation by therapy has been for inpatient rehab but the patient is out of network for our in-house rehab unit - the possibility of inpatient rehab in [facility] was investigated but the patient is not interested.   Currently looking into SNF bed.   Environmental exposure to bathroom mold/mildew antihistamine      DVT prophylaxis: heparin Code Status: full Family Communication: aunt, sig other Disposition:   Status is: Inpatient Remains inpatient appropriate because: need for continued inpatient care   Consultants:  PCCM renal  Procedures:  See above  Antimicrobials:  Anti-infectives (From admission, onward)    Start     Dose/Rate Route Frequency Ordered Stop   10/29/22 1000  sulfamethoxazole-trimethoprim (BACTRIM) 400-80 MG per tablet 1 tablet        1 tablet Oral Once per day on Monday Wednesday Friday 10/27/22 1317     10/23/22 1545  fluconazole (DIFLUCAN) IVPB 200 mg  Status:  Discontinued        200 mg 100 mL/hr over 60 Minutes Intravenous Every 24 hours 10/23/22 1449 10/23/22 1449   10/23/22 1545  fluconazole (DIFLUCAN) IVPB 200 mg  Status:  Discontinued        200 mg 100 mL/hr over 60 Minutes Intravenous Every 24 hours 10/23/22 1449 10/23/22 1455   10/18/22 1000  sulfamethoxazole-trimethoprim (BACTRIM) 400-80 MG per tablet 1 tablet  Status:  Discontinued        1 tablet Per Tube Once per day on Monday Wednesday Friday 10/17/22 1108 10/27/22 1317   10/16/22 1200  sulfamethoxazole-trimethoprim (BACTRIM) 400 mg of trimethoprim in dextrose 5 % 500 mL IVPB  Status:  Discontinued        400 mg of trimethoprim 350 mL/hr over 90 Minutes Intravenous Every 6 hours 10/16/22 1041 10/17/22 1018   10/15/22 1400  sulfamethoxazole-trimethoprim (BACTRIM) 550 mg of  trimethoprim in dextrose 5 % 1,000 mL IVPB  Status:  Discontinued        550 mg of trimethoprim 689.6 mL/hr over 90 Minutes Intravenous Every 8 hours 10/15/22 0937 10/15/22 0940   10/15/22 1200  sulfamethoxazole-trimethoprim (BACTRIM) 550 mg of trimethoprim in dextrose 5 % 1,000 mL IVPB  Status:  Discontinued        550 mg of trimethoprim 689.6 mL/hr over 90 Minutes Intravenous Every 6 hours 10/15/22 0940 10/16/22 1041   10/13/22 1030  sulfamethoxazole-trimethoprim (BACTRIM) 1,000 mg of trimethoprim in dextrose 5 % 1,000 mL IVPB  Status:  Discontinued        1,000 mg of trimethoprim 708.3 mL/hr over 90 Minutes Intravenous Every 6 hours 10/13/22 0942 10/13/22 0945   10/13/22 1030  sulfamethoxazole-trimethoprim (BACTRIM) 550 mg of trimethoprim in dextrose 5 % 1,000 mL IVPB  Status:  Discontinued        550 mg of trimethoprim 689.6 mL/hr over 90 Minutes Intravenous Every 6 hours 10/13/22 0945 10/15/22 0937   10/13/22 1000  doxycycline (VIBRA-TABS) tablet 100 mg  Status:  Discontinued        100 mg Per Tube Every 12 hours 10/13/22 0731 10/13/22 0954   10/12/22 1600  piperacillin-tazobactam (ZOSYN) IVPB 3.375 g        3.375 g 12.5 mL/hr over 240 Minutes Intravenous Every 8 hours 10/12/22 1516 10/18/22 2030   10/11/22 1230 **Note De-Identified vi Obfusction** ceFEPIme (MAXIPIME) 2 g in sodium chloride 0.9 % 100 mL IVPB  Sttus:  Discontinued        2 g 200 mL/hr over 30 Minutes Intrvenous Every 12 hours 10/11/22 1158 10/12/22 1508   10/11/22 1000  doxycycline (VIBRA-TABS) tblet 100 mg  Sttus:  Discontinued        100 mg Orl Dily 10/10/22 0117 10/10/22 0126   10/11/22 0000  ceFEPIme (MAXIPIME) 2 g in sodium chloride 0.9 % 100 mL IVPB  Sttus:  Discontinued        2 g 200 mL/hr over 30 Minutes Intrvenous Every 24 hours 10/10/22 0122 10/10/22 0123   10/11/22 0000  ceFEPIme (MAXIPIME) 1 g in sodium chloride 0.9 % 100 mL IVPB  Sttus:  Discontinued        1 g 200 mL/hr over 30 Minutes Intrvenous Every 24 hours 10/10/22 0123  10/11/22 1158   10/10/22 0215  doxycycline (VIBRA-TABS) tblet 100 mg  Sttus:  Discontinued        100 mg Orl Every 12 hours 10/10/22 0126 10/13/22 0730   10/09/22 2315  ceFEPIme (MAXIPIME) 2 g in sodium chloride 0.9 % 100 mL IVPB        2 g 200 mL/hr over 30 Minutes Intrvenous  Once 10/09/22 2303 10/10/22 0135   10/09/22 2130  doxycycline (VIBRAMYCIN) 100 mg in sodium chloride 0.9 % 250 mL IVPB        100 mg 125 mL/hr over 120 Minutes Intrvenous  Once 10/09/22 2115 10/10/22 0011   10/09/22 2053  ceFEPIme (MAXIPIME) 1 g in sodium chloride 0.9 % 100 mL IVPB  Sttus:  Discontinued        1 g 200 mL/hr over 30 Minutes Intrvenous Every 24 hours 10/09/22 2054 10/09/22 2303   10/09/22 2045  ceFEPIme (MAXIPIME) 2 g in sodium chloride 0.9 % 100 mL IVPB  Sttus:  Discontinued        2 g 200 mL/hr over 30 Minutes Intrvenous Every 24 hours 10/09/22 2040 10/09/22 2054       Subjective: No complints Doesn't sy much tody, her unt does  lot of the tlking  Objective: Vitls:   11/09/22 1608 11/09/22 2037 11/10/22 0612 11/10/22 0852  BP: (!) 152/92 (!) 154/84 (!) 166/83 (!) 170/90  Pulse: 91 93 94 91  Resp: 20 20 18 18   Temp: 98 F (36.7 C) 98 F (36.7 C) 98.5 F (36.9 C) 98.3 F (36.8 C)  TempSrc: Orl     SpO2: 96% 100%  91%  Weight:      Height:        Intke/Output Summry (Lst 24 hours) t 11/10/2022 1459 Lst dt filed t 11/10/2022 0618 Gross per 24 hour  Intke --  Output 525 ml  Net -525 ml   Filed Weights   11/04/22 0622 11/08/22 0421 11/09/22 0523  Weight: 105.3 kg 99.2 kg 98.7 kg    Exmintion:  Generl exm: Appers clm nd comfortble  Respirtory system: unlbored Crdiovsculr system: RRR Gstrointestinl system: Abdomen is nondistended, soft nd nontender. Centrl nervous system: Alert nd oriented. No focl neurologicl deficits. Extremities: trce LE edem   Dt Reviewed: I hve personlly reviewed following lbs nd imging  studies  CBC: Recent Lbs  Lb 11/04/22 0106 11/09/22 0428 11/10/22 0139  WBC 5.3 5.8 5.9  HGB 9.1* 10.1* 9.9*  HCT 29.4* 32.9* 32.8*  MCV 85.2 83.7 81.6  PLT 230 382 362    Bsic Metbolic Pnel: Recent Lbs  Lb 11/04/22 0106  11/09/22 0428 11/10/22 0139  N 137 136 132*  K 3.8 4.1 3.6  CL 106 106 104  CO2 17* 17* 17*  GLUCOSE 215* 135* 197*  BUN 10 11 11   CRETININE 0.89 1.00 1.20*  CLCIUM 8.8* 9.0 8.8*  MG  --  1.2* 2.0    GFR: Estimated Creatinine Clearance: 69.5 mL/min () (by C-G formula based on SCr of 1.2 mg/dL (H)).  Liver Function Tests: Recent Labs  Lab 11/04/22 0106 11/10/22 0139  ST 14* 10*  LT 28 17  LKPHOS 94 78  BILITOT 0.4 0.5  PROT 6.5 6.6  LBUMIN 2.7* 2.8*    CBG: Recent Labs  Lab 11/09/22 1202 11/09/22 1610 11/09/22 2038 11/10/22 0726 11/10/22 1150  GLUCP 147* 129* 103* 201* 81     No results found for this or any previous visit (from the past 240 hour(s)).       Radiology Studies: CT HED WO CONTRST (<MESUREMENT>)  Result Date: 11/09/2022 CLINICL DT:  Delirium EXM: CT HED WITHOUT CONTRST TECHNIQUE: Contiguous axial images were obtained from the base of the skull through the vertex without intravenous contrast. RDITION DOSE REDUCTION: This exam was performed according to the departmental dose-optimization program which includes automated exposure control, adjustment of the m and/or kV according to patient size and/or use of iterative reconstruction technique. COMPRISON:  08/09/2015 FINDINGS: Brain: No acute intracranial abnormality. Specifically, no hemorrhage, hydrocephalus, mass lesion, acute infarction, or significant intracranial injury. Vascular: No hyperdense vessel or unexpected calcification. Skull: No acute calvarial abnormality. Sinuses/Orbits: No acute findings Other: None IMPRESSION: Normal study. Electronically Signed   By: Charlett Nose M.D.   On: 11/09/2022 21:30        Scheduled Meds:  atorvastatin   10 mg Oral Daily   buPROPion  225 mg Oral BID   folic acid  1 mg Oral Daily   heparin  5,000 Units Subcutaneous Q8H   insulin aspart  0-20 Units Subcutaneous TID WC   insulin aspart  0-5 Units Subcutaneous QHS   insulin aspart  4 Units Subcutaneous TID WC   insulin detemir  10 Units Subcutaneous BID   labetalol  300 mg Oral BID   loratadine  10 mg Oral Daily   losartan  100 mg Oral Daily   montelukast  10 mg Oral QHS   multivitamin with minerals  1 tablet Oral Daily   mycophenolate  720 mg Oral BID   pantoprazole  40 mg Oral Daily   predniSONE  5 mg Oral Q breakfast   spironolactone  50 mg Oral Daily   sulfamethoxazole-trimethoprim  1 tablet Oral Once per day on Monday Wednesday Friday   tacrolimus  4 mg Oral BID   Continuous Infusions:   LOS: 31 days    Time spent: oer 30 min    Lacretia Nicks, MD [facility]   To contact the attending provider between 7-7P or the covering provider during after hours 7P-7, please log into the web site [email or web address] and access using universal Kinder password for that web site. If you do not have the password, please call the hospital operator.  11/10/2022, 2:59 PM

## 2022-11-10 NOTE — Progress Notes (Signed)
Physical Therapy Treatment Patient Details Name: Linda Ellison MRN: [id number] DOB: 10/14/1975 Today's Date: 11/10/2022   History of Present Illness 47 y.o. female presents to [facility] hospital on 10/09/2022 with CAP. Pt transferred to ICU on 7/9, intubated 7/10. HDU initiated on 7/15 due to fluid overload. Bronch 7/18. Extubated 10/26/2022. PMH includes renal transplant, DMII, HTN, HLD, OSA, CAD, hypothyroidism, gastroparesis, anxiety, depression.    PT Comments  Pt received in supine, agreeable to therapy session with encouragement and with good participation and fair tolerance for transfer training, standing tolerance limited to 2-4 mins only due to symptomatic orthostatic hypotension. Pt family (aunt) instructed on guarding positions with gait belt, safe technique with transfers, technique to don compression socks (XXL size delivered to room as previous size was too tight) and benefits of mobility. Plan to work on wheelchair transfers and mobility next session as pt remains orthostatic and not yet able to progress gait. DME recs below remain appropriate if pt instead chooses to DC home. Pt continues to benefit from PT services to progress toward functional mobility goals.    11/10/22 1600  Orthostatic Sitting  BP- Sitting (!) 160/93  Pulse- Sitting 97  Orthostatic Standing at 0 minutes  BP- Standing at 0 minutes 96/64  Pulse- Standing at 0 minutes 100 (poor signal)     If plan is discharge home, recommend the following: Assistance with cooking/housework;Direct supervision/assist for medications management;Assist for transportation;Help with stairs or ramp for entrance;A lot of help with walking and/or transfers;A lot of help with bathing/dressing/bathroom   Can travel by private vehicle     No  Equipment Recommendations  Wheelchair (measurements PT);Wheelchair cushion (measurements PT);Rolling walker (2 wheels);BSC/3in1;Other (comment);Hospital bed (wheelchair with removable arm and leg rests,  elevating leg rests)    Recommendations for Other Services       Precautions / Restrictions Precautions Precautions: Fall Precaution Comments: LUE AV fistula, watch BP Restrictions Weight Bearing Restrictions: No     Mobility  Bed Mobility Overal bed mobility: Needs Assistance Bed Mobility: Rolling, Sidelying to Sit Rolling: Supervision Sidelying to sit: Contact guard assist       General bed mobility comments: Heavy reliance on bed features, HOB elevated, cues for technique/posture.    Transfers Overall transfer level: Needs assistance Equipment used: 2 person hand held assist (face to face with NT, PTA standing by for safety) Transfers: Sit to/from Stand, Bed to chair/wheelchair/BSC Sit to Stand: Mod assist, From elevated surface, Min assist   Step pivot transfers: Min assist       General transfer comment: EOB>RW with modA. MinA lift assist and min/modA steadying assist at RW prior to standing BP assessment x3 trials from chair, pt standing 2-4 mins at a time.    Ambulation/Gait Ambulation/Gait assistance: Min assist, Mod assist   Assistive device: Rolling walker (2 wheels)       Pre-gait activities: standing weight shifting x5 reps ea LE General Gait Details: defer due to symptomatic orthostatic hypotension, see comments above   Stairs             Wheelchair Mobility     Tilt Bed    Modified Rankin (Stroke Patients Only)       Balance Overall balance assessment: Needs assistance Sitting-balance support: No upper extremity supported, Feet supported Sitting balance-Leahy Scale: Fair   Postural control: Posterior lean Standing balance support: Bilateral upper extremity supported, Reliant on assistive device for balance Standing balance-Leahy Scale: Poor Standing balance comment: min/modA for static standing at RW, pt lightheaded and  did have posterior LOB multiple times and mild tremors in UE throughout, increased with standing                             Cognition Arousal: Alert Behavior During Therapy: Flat affect Overall Cognitive Status: Impaired/Different from baseline Area of Impairment: Safety/judgement, Awareness                         Safety/Judgement: Decreased awareness of safety, Decreased awareness of deficits Awareness: Emergent   General Comments: Following commands, slow processing. Pt participatory as able.        Exercises General Exercises - Lower Extremity Ankle Circles/Pumps: AROM, Both, 10 reps, Seated Long Arc Quad: Seated, AROM, Right, Left, 10 reps Hip Flexion/Marching: AROM, Left, Right, Seated, 10 reps Other Exercises Other Exercises: IS ~1250-1500 x10 reps, encouraged hourly use Other Exercises: static standing x2 mins (during BP assessment) x3 trials Other Exercises: Reviewed seated/supine BLE exercises and handout given to reinforce with frequency written on it. Pt and aunt receptive.    General Comments General comments (skin integrity, edema, etc.): pt assisted to don compression socks prior to EOB/OOB, reviewed with pt aunt technique to do this, she is unable to assist due to shoulder issue so PTA placed them for pt.      Pertinent Vitals/Pain Pain Assessment Pain Assessment: Faces Faces Pain Scale: No hurt Pain Intervention(s): Monitored during session, Repositioned    Home Living                          Prior Function            PT Goals (current goals can now be found in the care plan section) Acute Rehab PT Goals Patient Stated Goal: Regain independence, mobilize PT Goal Formulation: With patient Time For Goal Achievement: 11/24/22 (supervising PT Ryan L updated today) Progress towards PT goals: Progressing toward goals    Frequency    Min 1X/week      PT Plan Current plan remains appropriate    Co-evaluation              AM-PAC PT "6 Clicks" Mobility   Outcome Measure  Help needed turning from your back to your  side while in a flat bed without using bedrails?: A Little Help needed moving from lying on your back to sitting on the side of a flat bed without using bedrails?: A Lot (without rail) Help needed moving to and from a bed to a chair (including a wheelchair)?: A Lot Help needed standing up from a chair using your arms (e.g., wheelchair or bedside chair)?: A Lot Help needed to walk in hospital room?: Total Help needed climbing 3-5 steps with a railing? : Total 6 Click Score: 11    End of Session Equipment Utilized During Treatment: Gait belt Activity Tolerance: Patient tolerated treatment well;Other (comment);Treatment limited secondary to medical complications (Comment);Patient limited by fatigue (symptomatic orthostatic hypotension) Patient left: in chair;with call bell/phone within reach;with family/visitor present;Other (comment) (family x4 in room) Nurse Communication: Mobility status;Other (comment) (orthostatic hypotension, she needs to wear LE compression if standing or transferring OOB) PT Visit Diagnosis: Other abnormalities of gait and mobility (R26.89);Muscle weakness (generalized) (M62.81);Difficulty in walking, not elsewhere classified (R26.2)     Time: 1600-1640 PT Time Calculation (min) (ACUTE ONLY): 40 min  Charges:    $Therapeutic Exercise: 8-22 mins $Therapeutic Activity: 23-37 mins PT  General Charges $$ ACUTE PT VISIT: 1 Visit                      P., PTA Acute Rehabilitation Services Secure Chat Preferred 9a-5:30pm Office: [phone]    Dorathy Kinsman [facility] 11/10/2022, 7:44 PM

## 2022-11-10 NOTE — Inpatient Diabetes Management (Addendum)
Inpatient Diabetes Program Recommendations  AACE/ADA: New Consensus Statement on Inpatient Glycemic Control (2015)  Target Ranges:  Prepandial:   less than 140 mg/dL      Peak postprandial:   less than 180 mg/dL (1-2 hours)      Critically ill patients:  140 - 180 mg/dL    Latest Reference Range & Units 11/09/22 08:43 11/09/22 12:02 11/09/22 16:10 11/09/22 20:38  Glucose-Capillary 70 - 99 mg/dL 562 (H) 130 (H) 865 (H) 103 (H)    Latest Reference Range & Units 11/10/22 07:26 11/10/22 11:50  Glucose-Capillary 70 - 99 mg/dL 784 (H) 81  (H): Data is abnormally high    Admit 10/09/2022 with Multifocal pneumonia/ SIRS  History: Type 1 Diabetes, ESRD  Home DM Meds: Novolog in insulin pump Pump setting per Endo: Basal: MN-1800 - 1.5               1800 - 1.6 - Total 36.9 units/24H CF - 20 with Goal of 110 ICR: 1:4  Current Orders: Levemir 10 units BID      Novolog Resistant Correction Scale/ SSI (0-20 units) TID AC + HS      Novolog 4 units TID with meals    12:15pm--Met w pt this AM.  Aunt Myra and Aura Camps Sr were at bedside.  Pt was A&O and able to answer my questions.  Aunt Myra told me pt's son Azucena Kuba and pt's Daughter are both familiar with insulin pen injections as pt needed injections prior to starting an insulin pump.  Aunt Myra also told me she has diabetes and uses insulin pens as well.  Aunt Myra told me she will be staying with pt after d/c during her Rehab period and can help with insulin pen injections as well.  Asked aunt and fiance if I could call son Mikle Bosworth Junior to ask him if he has any questions/concerns about helping his Mom with injections when she goes home--Family told me to call son after 1pm.    Addendum 1:30pm--Called Pt's son Azucena Kuba.  Son told me he is familiar with insulin pen injections and will be able to help pt with her insulin injections when she goes home.  Asked son if he had any questions and he stated that he did not.  Told son I will place  an Insulin Pen instructional video embedded in a QR code in pt's AVS in case he any questions about the pen when pt goes home.  Son stated understanding.   --Will follow patient during hospitalization--  Ambrose Finland RN, MSN, CDCES Diabetes Coordinator Inpatient Glycemic Control Team Team Pager: [phone] (8a-5p)

## 2022-11-10 NOTE — TOC Progression Note (Signed)
Transition of Care [facility]) - Initial/Assessment Note    Patient Details  Name: Linda Ellison MRN: [id number] Date of Birth: October 18, 1975  Transition of Care [facility]) CM/SW Contact:    Ralene Bathe, LCSW Phone Number: 11/10/2022, 3:45 PM  Clinical Narrative:                 PASRR number received. [id number]         Patient Goals and CMS Choice            Expected Discharge Plan and Services                                              Prior Living Arrangements/Services                       Activities of Daily Living Home Assistive Devices/Equipment: None ADL Screening (condition at time of admission) Patient's cognitive ability adequate to safely complete daily activities?: Yes Is the patient deaf or have difficulty hearing?: No Does the patient have difficulty seeing, even when wearing glasses/contacts?: No Does the patient have difficulty concentrating, remembering, or making decisions?: No Patient able to express need for assistance with ADLs?: Yes Does the patient have difficulty dressing or bathing?: No Independently performs ADLs?: Yes (appropriate for developmental age) Does the patient have difficulty walking or climbing stairs?: No Weakness of Legs: None Weakness of Arms/Hands: None  Permission Sought/Granted                  Emotional Assessment              Admission diagnosis:  Hypokalemia [E87.6] Hypomagnesemia [E83.42] Prolonged Q-T interval on ECG [R94.31] Immunosuppressed status (HCC) [D84.9] AKI (acute kidney injury) (HCC) [N17.9] Multifocal pneumonia [J18.9] Patient Active Problem List   Diagnosis Date Noted   Septic shock (HCC) 10/14/2022   History of CAD (coronary artery disease) 10/10/2022   Essential hypertension 10/10/2022   Acute hypoxic respiratory failure (HCC) 10/10/2022   Peripheral neuropathy 10/10/2022   Generalized anxiety disorder 10/10/2022   Hypocalcemia 10/10/2022   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) 10/10/2022   Multifocal pneumonia 10/09/2022   Protein-calorie malnutrition, severe 08/28/2022   AKI (acute kidney injury) of transplanted kidney (HCC) 08/26/2022   Nausea vomiting and diarrhea 08/26/2022   Hypokalemia 08/26/2022   Hypomagnesemia 08/26/2022   Dehydration 08/26/2022   Renal failure syndrome 03/05/2022   Renal artery stenosis, transplant (HCC) 01/03/2022   Snoring 04/02/2019   OSA (obstructive sleep apnea) 01/31/2019   Chronic anemia 03/03/2018   Abnormal uterine bleeding 03/03/2018   Abnormal cervical Papanicolaou smear 03/03/2018   Postcoital bleeding 03/03/2018   PTSD (post-traumatic stress disorder) 11/22/2017   MDD (major depressive disorder), recurrent, severe, with psychosis (HCC) 11/21/2017   Renovascular hypertension 09/05/2017   Steal syndrome of dialysis vascular access (HCC) 06/16/2017   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   Left arm cellulitis 12/08/2016   Metabolic acidosis 11/06/2016   Immunosuppressed status (HCC) 10/25/2016   PNA (pneumonia) 09/08/2015   Migraine without aura and responsive to treatment 08/26/2015   Prolonged QT interval 05/14/2015   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) 05/06/2015   Hyperthyroidism 05/06/2015   CAD (coronary artery disease) 04/01/2015   Chronic chest pain 04/01/2015   Gastroparesis due to DM (HCC) 04/12/2013   Chronic  constipation 04/10/2013   Irritant dermatitis 02/22/2013   Scarring alopecia 10/19/2012   Personal history of adenomatouscolonic polyps 10/26/2011   Asthma, chronic, unspecified asthma severity, with acute exacerbation 01-29-2011   DM (diabetes mellitus) type II controlled with renal manifestation (HCC) 2011/01/29   Diabetic nephropathy (HCC) 2011-01-29   Insulin dependent diabetes mellitus type IA (HCC) 2011-01-29   Heart failure (HCC) January 29, 2011   Status post deceased-donor kidney transplantation  12/21/2008   Hyperkalemia 12/19/2008   Obesity, unspecified 08/08/2008   GERD (gastroesophageal reflux disease) 08/08/2008   Personal history presenting hazards to health 08/08/2008   Essential hypertension 04/06/1999   PCP:  Daylene Katayama, PA Pharmacy:   [id number] [place], [place] - [address] [place] [address] Phone: [phone] Fax: [phone]     Social Determinants of Health (SDOH) Social History: SDOH Screenings   Food Insecurity: No Food Insecurity (11/07/2022)  Housing: Medium Risk (11/07/2022)  Transportation Needs: No Transportation Needs (11/07/2022)  Utilities: Not At Risk (11/07/2022)  Alcohol Screen: Low Risk  (11/21/2017)  Financial Resource Strain: Low Risk  (05/13/2021)   [facility] prior to 06/05/2022.  Physical Activity: Unknown (05/13/2021)   [facility] prior to [facility]/05/[facility]22., [facility] visits prior to 06/05/2022.  Social Connections: Unknown (08/16/2021)   [facility]  Stress: No Stress Concern Present (05/13/2021)   [facility] prior to 06/05/2022., [facility] visits prior to 06/05/2022.  Tobacco Use: Low Risk  (10/10/2022)   SDOH Interventions: Transportation Interventions: Intervention Not Indicated, Inpatient TOC, Patient Resources (Friends/Family)   Readmission Risk Interventions    10/11/2022    3:46 PM  Readmission Risk Prevention Plan  Transportation Screening Complete  Medication Review (RN Care Manager) Referral to Pharmacy  PCP or Specialist appointment within 3-5 days of discharge Complete  HRI or Home Care Consult Complete  SW Recovery Care/Counseling Consult Complete   Palliative Care Screening Not Applicable  Skilled Nursing Facility Not Applicable

## 2022-11-10 NOTE — TOC Progression Note (Signed)
Transition of Care [facility]) - Initial/Assessment Note    Patient Details  Name: Linda Ellison MRN: [id number] Date of Birth: 09/27/1975  Transition of Care [facility]) CM/SW Contact:    Ralene Bathe, LCSW Phone Number: 11/10/2022, 12:37 PM  Clinical Narrative:                 RE: Linda Ellison Date of Birth: 10-20-1975 Date: 11/10/2022  Please be advised that the above-named patient will require a short-term nursing home stay - anticipated 30 days or less for rehabilitation and strengthening.  The plan is for return home.        Patient Goals and CMS Choice            Expected Discharge Plan and Services                                              Prior Living Arrangements/Services                       Activities of Daily Living Home Assistive Devices/Equipment: None ADL Screening (condition at time of admission) Patient's cognitive ability adequate to safely complete daily activities?: Yes Is the patient deaf or have difficulty hearing?: No Does the patient have difficulty seeing, even when wearing glasses/contacts?: No Does the patient have difficulty concentrating, remembering, or making decisions?: No Patient able to express need for assistance with ADLs?: Yes Does the patient have difficulty dressing or bathing?: No Independently performs ADLs?: Yes (appropriate for developmental age) Does the patient have difficulty walking or climbing stairs?: No Weakness of Legs: None Weakness of Arms/Hands: None  Permission Sought/Granted                  Emotional Assessment              Admission diagnosis:  Hypokalemia [E87.6] Hypomagnesemia [E83.42] Prolonged Q-T interval on ECG [R94.31] Immunosuppressed status (HCC) [D84.9] AKI (acute kidney injury) (HCC) [N17.9] Multifocal pneumonia [J18.9] Patient Active Problem List   Diagnosis Date Noted   Septic shock (HCC) 10/14/2022   History of CAD (coronary artery disease) 10/10/2022    Essential hypertension 10/10/2022   Acute hypoxic respiratory failure (HCC) 10/10/2022   Peripheral neuropathy 10/10/2022   Generalized anxiety disorder 10/10/2022   Hypocalcemia 10/10/2022   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) 10/10/2022   Multifocal pneumonia 10/09/2022   Protein-calorie malnutrition, severe 08/28/2022   AKI (acute kidney injury) of transplanted kidney (HCC) 08/26/2022   Nausea vomiting and diarrhea 08/26/2022   Hypokalemia 08/26/2022   Hypomagnesemia 08/26/2022   Dehydration 08/26/2022   Renal failure syndrome 03/05/2022   Renal artery stenosis, transplant (HCC) 01/03/2022   Snoring 04/02/2019   OSA (obstructive sleep apnea) 01/31/2019   Chronic anemia 03/03/2018   Abnormal uterine bleeding 03/03/2018   Abnormal cervical Papanicolaou smear 03/03/2018   Postcoital bleeding 03/03/2018   PTSD (post-traumatic stress disorder) 11/22/2017   MDD (major depressive disorder), recurrent, severe, with psychosis (HCC) 11/21/2017   Renovascular hypertension 09/05/2017   Steal syndrome of dialysis vascular access (HCC) 06/16/2017   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   Left arm cellulitis 12/08/2016   Metabolic acidosis 11/06/2016   Immunosuppressed status (HCC) 10/25/2016   PNA (pneumonia) 09/08/2015   Migraine without aura and responsive to treatment 08/26/2015   Prolonged QT interval 05/14/2015   ESRD due  to diabetic nephropathy s/p renal transplant 10/2016(HCC) 05/06/2015   Hyperthyroidism 05/06/2015   CAD (coronary artery disease) 04/01/2015   Chronic chest pain 04/01/2015   Gastroparesis due to DM (HCC) 04/12/2013   Chronic constipation 04/10/2013   Irritant dermatitis 02/22/2013   Scarring alopecia 10/19/2012   Personal history of adenomatouscolonic polyps 10/26/2011   Asthma, chronic, unspecified asthma severity, with acute exacerbation 2011-02-19   DM (diabetes mellitus) type II controlled with renal manifestation  (HCC) 02-19-11   Diabetic nephropathy (HCC) 02-19-2011   Insulin dependent diabetes mellitus type IA (HCC) 02/19/2011   Heart failure (HCC) 2011-02-19   Status post deceased-donor kidney transplantation 12/21/2008   Hyperkalemia 12/19/2008   Obesity, unspecified 08/08/2008   GERD (gastroesophageal reflux disease) 08/08/2008   Personal history presenting hazards to health 08/08/2008   Essential hypertension 04/06/1999   PCP:  Daylene Katayama, PA Pharmacy:   [id number] [place], [place] - [address] [place] [address] Phone: [phone] Fax: [phone]     Social Determinants of Health (SDOH) Social History: SDOH Screenings   Food Insecurity: No Food Insecurity (11/07/2022)  Housing: Medium Risk (11/07/2022)  Transportation Needs: No Transportation Needs (11/07/2022)  Utilities: Not At Risk (11/07/2022)  Alcohol Screen: Low Risk  (11/21/2017)  Financial Resource Strain: Low Risk  (05/13/2021)   [facility] to [facility] prior to 06/05/2022.  Physical Activity: Unknown (05/13/2021)   [facility] prior to [facility]/05/2022., [facility] visits prior to 06/05/2022.  Social Connections: Unknown (08/16/2021)   Received [facility], [facility]  Stress: No Stress Concern Present (05/13/2021)   [facility] prior to 06/05/2022., [facility] visits prior to 06/05/2022.  Tobacco Use: Low Risk  (10/10/2022)   SDOH Interventions: Transportation Interventions: Intervention Not Indicated, Inpatient TOC, Patient Resources (Friends/Family)   Readmission Risk Interventions    10/11/2022    3:46 PM  Readmission Risk Prevention Plan  Transportation Screening Complete  Medication  Review (RN Care Manager) Referral to Pharmacy  PCP or Specialist appointment within 3-5 days of discharge Complete  HRI or Home Care Consult Complete  SW Recovery Care/Counseling Consult Complete  Palliative Care Screening Not Applicable  Skilled Nursing Facility Not Applicable

## 2022-11-10 NOTE — [place] FL2 (Signed)
[place] MEDICAID FL2 LEVEL OF CARE FORM     IDENTIFICATION  Patient Name: Linda Ellison Birthdate: 1975/12/19 Sex: female Admission Date (Current Location): 10/09/2022  [place] and [place] Number:  Haynes Bast [id number] R Facility and Address:  The [facility], [address] [place], [place] [address]      Provider Number: [id number]  Attending Physician Name and Address:  Zigmund Daniel., *  Relative Name and Phone Number:  Joellyn Quails (Son)  [phone]    Current Level of Care: Hospital Recommended Level of Care: Skilled Nursing Facility Prior Approval Number:    Date Approved/Denied:   PASRR Number: pending  Discharge Plan: SNF    Current Diagnoses: Patient Active Problem List   Diagnosis Date Noted   Septic shock (HCC) 10/14/2022   History of CAD (coronary artery disease) 10/10/2022   Essential hypertension 10/10/2022   Acute hypoxic respiratory failure (HCC) 10/10/2022   Peripheral neuropathy 10/10/2022   Generalized anxiety disorder 10/10/2022   Hypocalcemia 10/10/2022   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) 10/10/2022   Multifocal pneumonia 10/09/2022   Protein-calorie malnutrition, severe 08/28/2022   AKI (acute kidney injury) of transplanted kidney (HCC) 08/26/2022   Nausea vomiting and diarrhea 08/26/2022   Hypokalemia 08/26/2022   Hypomagnesemia 08/26/2022   Dehydration 08/26/2022   Renal failure syndrome 03/05/2022   Renal artery stenosis, transplant (HCC) 01/03/2022   Snoring 04/02/2019   OSA (obstructive sleep apnea) 01/31/2019   Chronic anemia 03/03/2018   Abnormal uterine bleeding 03/03/2018   Abnormal cervical Papanicolaou smear 03/03/2018   Postcoital bleeding 03/03/2018   PTSD (post-traumatic stress disorder) 11/22/2017   MDD (major depressive disorder), recurrent, severe, with psychosis (HCC) 11/21/2017   Renovascular hypertension 09/05/2017   Steal syndrome of dialysis  vascular access (HCC) 06/16/2017   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   Left arm cellulitis 12/08/2016   Metabolic acidosis 11/06/2016   Immunosuppressed status (HCC) 10/25/2016   PNA (pneumonia) 09/08/2015   Migraine without aura and responsive to treatment 08/26/2015   Prolonged QT interval 05/14/2015   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) 05/06/2015   Hyperthyroidism 05/06/2015   CAD (coronary artery disease) 04/01/2015   Chronic chest pain 04/01/2015   Gastroparesis due to DM (HCC) 04/12/2013   Chronic constipation 04/10/2013   Irritant dermatitis 02/22/2013   Scarring alopecia 10/19/2012   Personal history of adenomatouscolonic polyps 10/26/2011   Asthma, chronic, unspecified asthma severity, with acute exacerbation Feb 10, 2011   DM (diabetes mellitus) type II controlled with renal manifestation (HCC) 02-10-11   Diabetic nephropathy (HCC) February 10, 2011   Insulin dependent diabetes mellitus type IA (HCC) 2011/02/10   Heart failure (HCC) February 10, 2011   Status post deceased-donor kidney transplantation 12/21/2008   Hyperkalemia 12/19/2008   Obesity, unspecified 08/08/2008   GERD (gastroesophageal reflux disease) 08/08/2008   Personal history presenting hazards to health 08/08/2008   Essential hypertension 04/06/1999    Orientation RESPIRATION BLADDER Height & Weight     Place, Situation, Time, Self  Normal Incontinent Weight: 217 lb 9.5 oz (98.7 kg) Height:  5\' 6"  (167.6 cm)  BEHAVIORAL SYMPTOMS/MOOD NEUROLOGICAL BOWEL NUTRITION STATUS      Continent Diet (see d/c summary)  AMBULATORY STATUS COMMUNICATION OF NEEDS Skin   Total Care Verbally Normal                       Personal Care Assistance Level of Assistance  Bathing, Feeding, Dressing Bathing Assistance: Limited assistance Feeding  assistance: Independent Dressing Assistance: Limited assistance     Functional Limitations Info  Sight, Hearing, Speech Sight Info:  Impaired Hearing Info: Adequate Speech Info: Adequate    SPECIAL CARE FACTORS FREQUENCY  PT (By licensed PT), OT (By licensed OT)     PT Frequency: 5x/ week OT Frequency: 5x/ week            Contractures Contractures Info: Present    Additional Factors Info  Code Status, Allergies, Psychotropic, Insulin Sliding Scale Code Status Info: FULL Allergies Info: Ciprofloxacin  Fluocinolone  Hydromorphone  Strawberry Extract  Hydromorphone Hcl  Phenytoin Sodium Extended  Adhesive (Tape)  Chlorhexidine Gluconate  Clindamycin  Shrimp (Shellfish Allergy) Psychotropic Info: see d/c med list Insulin Sliding Scale Info: see d/c med list       Current Medications (11/10/2022):  This is the current hospital active medication list Current Facility-Administered Medications  Medication Dose Route Frequency Provider Last Rate Last Admin   acetaminophen (TYLENOL) tablet 650 mg  650 mg Oral Q6H PRN Lonia Blood, MD       albuterol (PROVENTIL) (2.5 MG/3ML) 0.083% nebulizer solution 3 mL  3 mL Inhalation Q6H PRN Sundil, Subrina, MD   3 mL at 11/09/22 0830   atorvastatin (LIPITOR) tablet 10 mg  10 mg Oral Daily Oretha Milch, MD   10 mg at 11/10/22 0953   benzonatate (TESSALON) capsule 100 mg  100 mg Oral TID PRN Lonia Blood, MD   100 mg at 11/09/22 2153   buPROPion (WELLBUTRIN) tablet 225 mg  225 mg Oral BID Oretha Milch, MD   225 mg at 11/10/22 0843   folic acid (FOLVITE) tablet 1 mg  1 mg Oral Daily Cyril Mourning V, MD   1 mg at 11/10/22 0953   heparin injection 5,000 Units  5,000 Units Subcutaneous Q8H Sundil, Subrina, MD   5,000 Units at 11/10/22 0616   insulin aspart (novoLOG) injection 0-20 Units  0-20 Units Subcutaneous TID WC Lonia Blood, MD   7 Units at 11/10/22 0844   insulin aspart (novoLOG) injection 0-5 Units  0-5 Units Subcutaneous QHS Jetty Duhamel T, MD       insulin aspart (novoLOG) injection 4 Units  4 Units Subcutaneous TID WC Lonia Blood, MD   4 Units  at 11/10/22 0847   insulin detemir (LEVEMIR) injection 10 Units  10 Units Subcutaneous BID Lonia Blood, MD   10 Units at 11/10/22 0954   labetalol (NORMODYNE) tablet 300 mg  300 mg Oral BID Jetty Duhamel T, MD   300 mg at 11/10/22 0953   loratadine (CLARITIN) tablet 10 mg  10 mg Oral Daily Jetty Duhamel T, MD   10 mg at 11/10/22 0953   losartan (COZAAR) tablet 100 mg  100 mg Oral Daily Cyril Mourning V, MD   100 mg at 11/10/22 0953   montelukast (SINGULAIR) tablet 10 mg  10 mg Oral QHS Oretha Milch, MD   10 mg at 11/09/22 2148   multivitamin with minerals tablet 1 tablet  1 tablet Oral Daily Oretha Milch, MD   1 tablet at 11/09/22 1241   mycophenolate (MYFORTIC) EC tablet 720 mg  720 mg Oral BID Darnell Level, MD   720 mg at 11/10/22 1478   oxyCODONE (Oxy IR/ROXICODONE) immediate release tablet 5 mg  5 mg Oral Q8H PRN Lonia Blood, MD   5 mg at 11/05/22 2246   pantoprazole (PROTONIX) EC tablet 40 mg  40 mg Oral  Daily John Giovanni, MD   40 mg at 11/10/22 0953   predniSONE (DELTASONE) tablet 5 mg  5 mg Oral Q breakfast Oretha Milch, MD   5 mg at 11/10/22 0843   prochlorperazine (COMPAZINE) injection 5 mg  5 mg Intravenous Q4H PRN Gery Pray, MD   5 mg at 11/06/22 1216   spironolactone (ALDACTONE) tablet 50 mg  50 mg Oral Daily Oretha Milch, MD   50 mg at 11/10/22 0954   sulfamethoxazole-trimethoprim (BACTRIM) 400-80 MG per tablet 1 tablet  1 tablet Oral Once per day on Monday Wednesday Friday Oretha Milch, MD   1 tablet at 11/10/22 0954   tacrolimus (PROGRAF) capsule 4 mg  4 mg Oral BID Jenita Seashore, RPH   4 mg at 11/10/22 5784   traZODone (DESYREL) tablet 100 mg  100 mg Oral QHS PRN Conrad Edgemont, MD   100 mg at 11/08/22 2222   white petrolatum (VASELINE) gel   Topical PRN Oretha Milch, MD         Discharge Medications: Please see discharge summary for a list of discharge medications.  Relevant Imaging Results:  Relevant Lab  Results:   Additional Information SSN: [id number]  Catalina Pizza , LCSW

## 2022-11-11 DIAGNOSIS — J189 Pneumonia, unspecified organism: Secondary | ICD-10-CM | POA: Diagnosis not present

## 2022-11-11 LAB — GLUCOSE, CAPILLARY
Glucose-Capillary: 152 mg/dL — ABNORMAL HIGH (ref 70–99)
Glucose-Capillary: 160 mg/dL — ABNORMAL HIGH (ref 70–99)
Glucose-Capillary: 86 mg/dL (ref 70–99)
Glucose-Capillary: 119 mg/dL — ABNORMAL HIGH (ref 70–99)

## 2022-11-11 MED ORDER — MAGNESIUM SULFATE 2 GM/50ML IV SOLN: 2.0000 g | Freq: Once | INTRAVENOUS | Status: DC

## 2022-11-11 MED ORDER — MAGNESIUM SULFATE 2 GM/50ML IV SOLN
2.0000 g | Freq: Once | INTRAVENOUS | Status: AC
  Administered 2022-11-11: 2 g via INTRAVENOUS
  Filled 2022-11-11: qty 50

## 2022-11-11 NOTE — TOC Progression Note (Addendum)
Transition of Care [facility]) - Initial/Assessment Note    Patient Details  Name: Linda Ellison MRN: [id number] Date of Birth: 1975/11/23  Transition of Care [facility]) CM/SW Contact:    Ralene Bathe, LCSW Phone Number: 11/11/2022, 11:26 AM  Clinical Narrative:                 LCSW met with the patient and patient's fiance at bed side to present bed offers.  The patient reports that she did not agree to discharge to a SNF.  The patient prefers to return home and receive physical therapy at an outpatient facility.  The patient and fiance report that the plan will be for the patient to move in with the fiance's mother and the fiance's mother will transport the patient to the outpatient PT. When asked who could be trained to give insulin to the patient, the fiance reports that the patient's step-son and step-daughter have given the patient insulin in the past and can assist.  LCSW will provide a list of outpatient rehab facilities.    Addendum 12:45-  LCSW met with the patient at bedside to present the list of outpatient rehab facilities.  LCSW had an in depth conversation with the patient and explained each discharge option and asked that the patient teach back each option.  The patient then agreed to go home with home health, but continues to politely decline SNF.  The patient plans to discharge to [facility] in [place] when medically ready.    TOC following.         Patient Goals and CMS Choice            Expected Discharge Plan and Services                                              Prior Living Arrangements/Services                       Activities of Daily Living Home Assistive Devices/Equipment: None ADL Screening (condition at time of admission) Patient's cognitive ability adequate to safely complete daily activities?: Yes Is the patient deaf or have difficulty hearing?: No Does the patient have difficulty seeing, even when wearing  glasses/contacts?: No Does the patient have difficulty concentrating, remembering, or making decisions?: No Patient able to express need for assistance with ADLs?: Yes Does the patient have difficulty dressing or bathing?: No Independently performs ADLs?: Yes (appropriate for developmental age) Does the patient have difficulty walking or climbing stairs?: No Weakness of Legs: None Weakness of Arms/Hands: None  Permission Sought/Granted                  Emotional Assessment              Admission diagnosis:  Hypokalemia [E87.6] Hypomagnesemia [E83.42] Prolonged Q-T interval on ECG [R94.31] Immunosuppressed status (HCC) [D84.9] AKI (acute kidney injury) (HCC) [N17.9] Multifocal pneumonia [J18.9] Patient Active Problem List   Diagnosis Date Noted   Septic shock (HCC) 10/14/2022   History of CAD (coronary artery disease) 10/10/2022   Essential hypertension 10/10/2022   Acute hypoxic respiratory failure (HCC) 10/10/2022   Peripheral neuropathy 10/10/2022   Generalized anxiety disorder 10/10/2022   Hypocalcemia 10/10/2022   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) 10/10/2022   Multifocal pneumonia 10/09/2022   Protein-calorie malnutrition, severe  08/28/2022   AKI (acute kidney injury) of transplanted kidney (HCC) 08/26/2022   Nausea vomiting and diarrhea 08/26/2022   Hypokalemia 08/26/2022   Hypomagnesemia 08/26/2022   Dehydration 08/26/2022   Renal failure syndrome 03/05/2022   Renal artery stenosis, transplant (HCC) 01/03/2022   Snoring 04/02/2019   OSA (obstructive sleep apnea) 01/31/2019   Chronic anemia 03/03/2018   Abnormal uterine bleeding 03/03/2018   Abnormal cervical Papanicolaou smear 03/03/2018   Postcoital bleeding 03/03/2018   PTSD (post-traumatic stress disorder) 11/22/2017   MDD (major depressive disorder), recurrent, severe, with psychosis (HCC) 11/21/2017   Renovascular hypertension 09/05/2017   Steal syndrome of  dialysis vascular access (HCC) 06/16/2017   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   Left arm cellulitis 12/08/2016   Metabolic acidosis 11/06/2016   Immunosuppressed status (HCC) 10/25/2016   PNA (pneumonia) 09/08/2015   Migraine without aura and responsive to treatment 08/26/2015   Prolonged QT interval 05/14/2015   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) 05/06/2015   Hyperthyroidism 05/06/2015   CAD (coronary artery disease) 04/01/2015   Chronic chest pain 04/01/2015   Gastroparesis due to DM (HCC) 04/12/2013   Chronic constipation 04/10/2013   Irritant dermatitis 02/22/2013   Scarring alopecia 10/19/2012   Personal history of adenomatouscolonic polyps 10/26/2011   Asthma, chronic, unspecified asthma severity, with acute exacerbation Feb 10, 2011   DM (diabetes mellitus) type II controlled with renal manifestation (HCC) 10-Feb-2011   Diabetic nephropathy (HCC) 02/10/2011   Insulin dependent diabetes mellitus type IA (HCC) 02-10-11   Heart failure (HCC) 02/10/11   Status post deceased-donor kidney transplantation 12/21/2008   Hyperkalemia 12/19/2008   Obesity, unspecified 08/08/2008   GERD (gastroesophageal reflux disease) 08/08/2008   Personal history presenting hazards to health 08/08/2008   Essential hypertension 04/06/1999   PCP:  Daylene Katayama, PA Pharmacy:   [id number] [place], [place] - [address] [place] [address] Phone: [phone] Fax: [phone]     Social Determinants of Health (SDOH) Social History: SDOH Screenings   Food Insecurity: No Food Insecurity (11/07/2022)  Housing: Medium Risk (11/07/2022)  Transportation Needs: No Transportation Needs (11/07/2022)  Utilities: Not At Risk (11/07/2022)  Alcohol Screen: Low Risk  (11/21/2017)  Financial Resource Strain: Low Risk  (05/13/2021)   [facility] prior to 06/05/2022.  Physical Activity: Unknown (05/13/2021)   [facility], [facility] prior to 06/05/2022., [facility] visits prior to 06/05/2022.  Social Connections: Unknown (08/16/2021)   [facility], [facility]  Stress: No Stress Concern Present (05/13/2021)   [facility], [facility] prior to 06/05/2022., [facility] visits prior to 06/05/2022.  Tobacco Use: Low Risk  (10/10/2022)   SDOH Interventions: Transportation Interventions: Intervention Not Indicated, Inpatient TOC, Patient Resources (Friends/Family)   Readmission Risk Interventions    10/11/2022    3:46 PM  Readmission Risk Prevention Plan  Transportation Screening Complete  Medication Review (RN Care Manager) Referral to Pharmacy  PCP or Specialist appointment within 3-5 days of discharge Complete  HRI or Home Care Consult Complete  SW Recovery Care/Counseling Consult Complete  Palliative Care Screening Not Applicable  Skilled Nursing Facility Not Applicable

## 2022-11-11 NOTE — Plan of Care (Signed)
  Problem: Health Behavior/Discharge Planning: Goal: Ability to manage health-related needs will improve Outcome: Progressing   Problem: Education: Goal: Knowledge of General Education information will improve Description: Including pain rating scale, medication(s)/side effects and non-pharmacologic comfort measures Outcome: Progressing   Problem: Clinical Measurements: Goal: Respiratory complications will improve Outcome: Progressing   Problem: Coping: Goal: Level of anxiety will decrease Outcome: Progressing   Problem: Pain Managment: Goal: General experience of comfort will improve Outcome: Progressing   Problem: Safety: Goal: Ability to remain free from injury will improve Outcome: Progressing   Problem: Fluid Volume: Goal: Ability to achieve a balanced intake and output will improve Outcome: Progressing   Problem: Activity: Goal: Ability to tolerate increased activity will improve Outcome: Progressing

## 2022-11-11 NOTE — Progress Notes (Signed)
Occupational Therapy Treatment Patient Details Name: Linda Ellison MRN: [id number] DOB: 07-17-1975 Today's Date: 11/11/2022   History of present illness 47 y.o. female presents to [facility] hospital on 10/09/2022 with CAP. Pt transferred to ICU on 7/9, intubated 7/10. HDU initiated on 7/15 due to fluid overload. Bronch 7/18. Extubated 10/26/2022. PMH includes renal transplant, DMII, HTN, HLD, OSA, CAD, hypothyroidism, gastroparesis, anxiety, depression.   OT comments  Pt declining SNF for post acute rehab and wants to go home. Pt recently worked with PT and became mildly SOB with BP dropping with standing to 93/59. Pt agreeable to sit EOB for grooming and UB ADL tasks with OT. Continue to recommend post acute rehab setting <3 hrs/day to maximize Ind and safety. BP readings: 141/72 supine, sitting 127/89. Pt is mildly SOB with minimal exertion, tremors in hands with activity. OT will continue to follow acutely to maximize level of function and safety .       If plan is discharge home, recommend the following:  Assistance with cooking/housework;Help with stairs or ramp for entrance;Assist for transportation;Assistance with feeding;A lot of help with walking and/or transfers;A lot of help with bathing/dressing/bathroom   Equipment Recommendations  BSC/3in1;Tub/shower seat;Wheelchair (measurements OT);Wheelchair cushion (measurements OT);Other (comment);Hospital bed (RW)    Recommendations for Other Services      Precautions / Restrictions Precautions Precautions: Fall Precaution Comments: LUE AV fistula, watch BP Restrictions Weight Bearing Restrictions: No       Mobility Bed Mobility Overal bed mobility: Needs Assistance Bed Mobility: Supine to Sit, Sit to Supine     Supine to sit: Contact guard, Used rails, HOB elevated Sit to supine: Contact guard assist, Used rails, HOB elevated   General bed mobility comments: cues for technique/posture.    Transfers                    General transfer comment: deferred due to fatigue and standing BP dropped with recent PT visit     Balance Overall balance assessment: Needs assistance Sitting-balance support: No upper extremity supported, Feet supported Sitting balance-Leahy Scale: Fair                                     ADL either performed or assessed with clinical judgement   ADL Overall ADL's : Needs assistance/impaired     Grooming: Wash/dry hands;Wash/dry face;Set up;Supervision/safety;Sitting   Upper Body Bathing: Contact guard assist Upper Body Bathing Details (indicate cue type and reason): simulated seated EOB     Upper Body Dressing : Contact guard assist;Sitting                     General ADL Comments: deferred SPT to chair and BSC. Pt recently working with PT with BP standing 93/59 with c/o mild SOB    Extremity/Trunk Assessment Upper Extremity Assessment Upper Extremity Assessment: Generalized weakness   Lower Extremity Assessment Lower Extremity Assessment: Defer to PT evaluation        Vision Baseline Vision/History: 0 No visual deficits Ability to See in Adequate Light: 0 Adequate Patient Visual Report: No change from baseline     Perception     Praxis      Cognition Arousal: Alert Behavior During Therapy: Flat affect Overall Cognitive Status: Impaired/Different from baseline Area of Impairment: Safety/judgement, Awareness, Problem solving  Safety/Judgement: Decreased awareness of safety, Decreased awareness of deficits   Problem Solving: Slow processing          Exercises      Shoulder Instructions       General Comments Pt was seen for progression of mobility to get up and balance, ck of BP reveals both symptoms and orthostatic findings    Pertinent Vitals/ Pain       Pain Assessment Pain Assessment: No/denies pain Faces Pain Scale: No hurt  Home Living                                           Prior Functioning/Environment              Frequency  Min 2X/week        Progress Toward Goals  OT Goals(current goals can now be found in the care plan section)  Progress towards OT goals: OT to reassess next treatment     Plan Discharge plan remains appropriate    Co-evaluation                 AM-PAC OT "6 Clicks" Daily Activity     Outcome Measure   Help from another person eating meals?: None Help from another person taking care of personal grooming?: A Little Help from another person toileting, which includes using toliet, bedpan, or urinal?: A Lot Help from another person bathing (including washing, rinsing, drying)?: A Lot Help from another person to put on and taking off regular upper body clothing?: A Little Help from another person to put on and taking off regular lower body clothing?: A Lot 6 Click Score: 16    End of Session    OT Visit Diagnosis: Muscle weakness (generalized) (M62.81);Other abnormalities of gait and mobility (R26.89)   Activity Tolerance Patient limited by fatigue   Patient Left with call bell/phone within reach;in chair;with family/visitor present   Nurse Communication          Time: 9563-8756 OT Time Calculation (min): 18 min  Charges: OT General Charges $OT Visit: 1 Visit OT Treatments $Self Care/Home Management : 8-22 mins    Galen Manila 11/11/2022, 2:49 PM

## 2022-11-11 NOTE — Plan of Care (Signed)
  Problem: Health Behavior/Discharge Planning: Goal: Ability to manage health-related needs will improve Outcome: Progressing   Problem: Nutritional: Goal: Maintenance of adequate nutrition will improve Outcome: Progressing   Problem: Skin Integrity: Goal: Risk for impaired skin integrity will decrease Outcome: Progressing   Problem: Education: Goal: Knowledge of General Education information will improve Description: Including pain rating scale, medication(s)/side effects and non-pharmacologic comfort measures Outcome: Progressing

## 2022-11-11 NOTE — Progress Notes (Signed)
TRH night cross cover note:   I was notified by RN that the patient serum magnesium level this morning is 1.6.  I subsequently placed order for magnesium sulfate 2 g IV over 2 hours.    Newton Pigg, DO Hospitalist

## 2022-11-11 NOTE — Progress Notes (Signed)
**Note De-Identified vi Obfusction** PROGRESS NOTE    Linda Ellison  DQQ:[id number] DOB: 10-14-1975 DOA: 10/09/2022 PCP: Dylene Ktym, PA  Chief Complint  Ptient presents with   Chest Pin    Brief Nrrtive:   33 yer old with  history of renl trnsplnt who ws dmitted to the hospitl 7/6 with  multifocl pneumoni nd ultimtely required mechnicl ventiltion.  Her hospitliztion ws complicted by cute renl filure which trnsiently required dilysis, but her renl function subsequently improved.  She completed ntibiotics for her pneumoni, ws successfully extubted, nd wened to room ir s of 7/26.  She ws trnsferred to the hospitlist service 7/26.   Significnt Events 7/6 dmission 7/9 trnsferred to ICU 7/10 intubted 7/15 worsening renl function with fluid overlod -hemodilysis initited 7/17 endotrchel tube exchnge due to mucous plugging 7/18 emergent bronchoscopy for mucous plugging 7/19 Cleviprex required due to severe hypertension -repet episode of mucous plugging 7/22 wened on pressure support with copious secretions noted 7/23 extubted but required BiPAP 7/24 Cleviprex drip resumed 7/26 tr[facility]ferred to [facility] service -witing rehb plcement Assessment & Pln:   Principl Problem:   Multifocl pneumoni Active Problems:   AKI (cute kidney injury) of trnsplnted kidney (HCC)   Hypoklemi   Acute hypoxic respirtory filure (HCC)   Asthm, chronic, unspecified sthm severity, with cute excerbtion   GERD (gstroesophgel reflux disese)   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)   History of CAD (coronry rtery disese)   CAD (coronry rtery disese)   DM (dibetes 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 (HCC)   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)   Chronic nemi   Metbolic cidosis   Immunosuppressed sttus (HCC)   Essentil hypertension   Peripherl neuropthy   Generlized nxiety disorder   Hypoclcemi   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)   Septic shock (HCC)  Acute hypoxic respirtory filure due to ARDS complicted by frequent mucous plugging Now resolved with ptient successfully wened to room ir   Aspirtion pneumonitis Occurred during the night 7/30-7/31 - ppers to hve been  self-limited minor event -no recurrence with ptient stble from  respirtory stndpoint   Orthosttic hypotension there my be  need to tolerte higher blood pressure trnsiently until outptient follow-up - counseled ptient on need to mke very slow positionl chnges nd to be cogniznt of potentil for syncope  Continue bilterl lower extremity venous compression stockings  Apprently got to bedside commode this AM without issues, follow orthosttics   Multifocl pneumoni in immunosuppressed ptient Hs completed  course of ntibiotic therpy -no clinicl signs of ctive infection t present   Acute renl filure of  trnsplnted kidney  Fortuntely this hs resolved with the ptient's renl function recovering - she did require dilysis during this hospitl sty - continue her usul immunosuppressive medictions - cretinine presently norml   Resistnt hypertension Elevted blood pressure continues to be n issue -s noted bove she hs not tolerted further titrtion of blood pressure mediction due to significnt orthostsis - t this time we re hving to ccept elevted blood pressure redings to void orthostsis tht occurs when upwrd titrtion of blood pressure medictions is ttempted - once ptient becomes more mobile nd when her orthostsis improves, further BP med titrtion should be possible    Hypomgnesemi Replce nd follow   DM2  ptient told the DM Coordintor tht she did not remember how to operte the pump Now using bsl/bolus insulin   Septic shock - resolved Due to bove  Acute metabolic encephalopathy due to multifocal pneumonia Head CT without  acute abnormality Normal TSH Elevated B12 (10/2022) Follow ammonia, follow b1, follow RPR Will refer to neurology outpatient for cognitive decline, consider MRI (either outpatient or inpatient) - aunt feels she's gradually improving, will hold off   Generalized weakness/deconditioning The recommendation by therapy has been for inpatient rehab but the patient is out of network for our in-house rehab unit - the possibility of inpatient rehab in [facility] was investigated but the patient is not interested.   Currently looking into SNF bed.   Environmental exposure to bathroom mold/mildew antihistamine      DVT prophylaxis: heparin Code Status: full Family Communication: sig other Disposition:   Status is: Inpatient Remains inpatient appropriate because: need for continued inpatient care   Consultants:  PCCM renal  Procedures:  See above  Antimicrobials:  Anti-infectives (From admission, onward)    Start     Dose/Rate Route Frequency Ordered Stop   10/29/22 1000  sulfamethoxazole-trimethoprim (BACTRIM) 400-80 MG per tablet 1 tablet        1 tablet Oral Once per day on Monday Wednesday Friday 10/27/22 1317     10/23/22 1545  fluconazole (DIFLUCAN) IVPB 200 mg  Status:  Discontinued        200 mg 100 mL/hr over 60 Minutes Intravenous Every 24 hours 10/23/22 1449 10/23/22 1449   10/23/22 1545  fluconazole (DIFLUCAN) IVPB 200 mg  Status:  Discontinued        200 mg 100 mL/hr over 60 Minutes Intravenous Every 24 hours 10/23/22 1449 10/23/22 1455   10/18/22 1000  sulfamethoxazole-trimethoprim (BACTRIM) 400-80 MG per tablet 1 tablet  Status:  Discontinued        1 tablet Per Tube Once per day on Monday Wednesday Friday 10/17/22 1108 10/27/22 1317   10/16/22 1200  sulfamethoxazole-trimethoprim (BACTRIM) 400 mg of trimethoprim in dextrose 5 % 500 mL IVPB  Status:  Discontinued        400 mg of trimethoprim 350 mL/hr over 90 Minutes Intravenous Every 6 hours 10/16/22 1041 10/17/22  1018   10/15/22 1400  sulfamethoxazole-trimethoprim (BACTRIM) 550 mg of trimethoprim in dextrose 5 % 1,000 mL IVPB  Status:  Discontinued        550 mg of trimethoprim 689.6 mL/hr over 90 Minutes Intravenous Every 8 hours 10/15/22 0937 10/15/22 0940   10/15/22 1200  sulfamethoxazole-trimethoprim (BACTRIM) 550 mg of trimethoprim in dextrose 5 % 1,000 mL IVPB  Status:  Discontinued        550 mg of trimethoprim 689.6 mL/hr over 90 Minutes Intravenous Every 6 hours 10/15/22 0940 10/16/22 1041   10/13/22 1030  sulfamethoxazole-trimethoprim (BACTRIM) 1,000 mg of trimethoprim in dextrose 5 % 1,000 mL IVPB  Status:  Discontinued        1,000 mg of trimethoprim 708.3 mL/hr over 90 Minutes Intravenous Every 6 hours 10/13/22 0942 10/13/22 0945   10/13/22 1030  sulfamethoxazole-trimethoprim (BACTRIM) 550 mg of trimethoprim in dextrose 5 % 1,000 mL IVPB  Status:  Discontinued        550 mg of trimethoprim 689.6 mL/hr over 90 Minutes Intravenous Every 6 hours 10/13/22 0945 10/15/22 0937   10/13/22 1000  doxycycline (VIBRA-TABS) tablet 100 mg  Status:  Discontinued        100 mg Per Tube Every 12 hours 10/13/22 0731 10/13/22 0954   10/12/22 1600  piperacillin-tazobactam (ZOSYN) IVPB 3.375 g        3.375 g 12.5 mL/hr over 240 Minutes Intravenous Every 8  hours 10/12/22 1516 10/18/22 2030   10/11/22 1230  ceFEPIme (MAXIPIME) 2 g in sodium chloride 0.9 % 100 mL IVPB  Status:  Discontinued        2 g 200 mL/hr over 30 Minutes Intravenous Every 12 hours 10/11/22 1158 10/12/22 1508   10/11/22 1000  doxycycline (VIBRA-TABS) tablet 100 mg  Status:  Discontinued        100 mg Oral Daily 10/10/22 0117 10/10/22 0126   10/11/22 0000  ceFEPIme (MAXIPIME) 2 g in sodium chloride 0.9 % 100 mL IVPB  Status:  Discontinued        2 g 200 mL/hr over 30 Minutes Intravenous Every 24 hours 10/10/22 0122 10/10/22 0123   10/11/22 0000  ceFEPIme (MAXIPIME) 1 g in sodium chloride 0.9 % 100 mL IVPB  Status:  Discontinued        1  g 200 mL/hr over 30 Minutes Intravenous Every 24 hours 10/10/22 0123 10/11/22 1158   10/10/22 0215  doxycycline (VIBRA-TABS) tablet 100 mg  Status:  Discontinued        100 mg Oral Every 12 hours 10/10/22 0126 10/13/22 0730   10/09/22 2315  ceFEPIme (MAXIPIME) 2 g in sodium chloride 0.9 % 100 mL IVPB        2 g 200 mL/hr over 30 Minutes Intravenous  Once 10/09/22 2303 10/10/22 0135   10/09/22 2130  doxycycline (VIBRAMYCIN) 100 mg in sodium chloride 0.9 % 250 mL IVPB        100 mg 125 mL/hr over 120 Minutes Intravenous  Once 10/09/22 2115 10/10/22 0011   10/09/22 2053  ceFEPIme (MAXIPIME) 1 g in sodium chloride 0.9 % 100 mL IVPB  Status:  Discontinued        1 g 200 mL/hr over 30 Minutes Intravenous Every 24 hours 10/09/22 2054 10/09/22 2303   10/09/22 2045  ceFEPIme (MAXIPIME) 2 g in sodium chloride 0.9 % 100 mL IVPB  Status:  Discontinued        2 g 200 mL/hr over 30 Minutes Intravenous Every 24 hours 10/09/22 2040 10/09/22 2054       Subjective: No complaints this AM  Objective: Vitals:   11/10/22 0852 11/10/22 2107 11/10/22 2207 11/11/22 0430  BP: (!) 170/90 (!) 154/93  (!) 171/96  Pulse: 91 91 88 92  Resp: 18 16 16 18   Temp: 98.3 F (36.8 C) 98.4 F (36.9 C)  98.4 F (36.9 C)  TempSrc:  Oral    SpO2: 91% 93% 93% 93%  Weight:      Height:        Intake/Output Summary (Last 24 hours) at 11/11/2022 0928 Last data filed at 11/11/2022 0430 Gross per 24 hour  Intake 600 ml  Output 0 ml  Net 600 ml   Filed Weights   11/04/22 0622 11/08/22 0421 11/09/22 0523  Weight: 105.3 kg 99.2 kg 98.7 kg    Examination:  General: No acute distress. Cardiovascular: RRR Lungs: unlabored Abdomen: Soft, nontender, nondistended Neurological: Alert and oriented 3. Moves all extremities 4 with equal strength. Cranial nerves II through XII grossly intact. Extremities: No clubbing or cyanosis. No edema.  Data Reviewed: I have personally reviewed following labs and imaging  studies  CBC: Recent Labs  Lab 11/09/22 0428 11/10/22 0139 11/11/22 0151  WBC 5.8 5.9 5.4  HGB 10.1* 9.9* 10.4*  HCT 32.9* 32.8* 33.9*  MCV 83.7 81.6 81.1  PLT 382 362 396    Basic Metabolic Panel: Recent Labs  Lab 11/09/22 0428 11/10/22  0139 11/11/22 0151  N 136 132* 134*  K 4.1 3.6 3.6  CL 106 104 105  CO2 17* 17* 17*  GLUCOSE 135* 197* 153*  BUN 11 11 10   CRETININE 1.00 1.20* 1.36*  CLCIUM 9.0 8.8* 9.0  MG 1.2* 2.0 1.6*  PHOS  --   --  4.5    GFR: Estimated Creatinine Clearance: 61.3 mL/min () (by C-G formula based on SCr of 1.36 mg/dL (H)).  Liver Function Tests: Recent Labs  Lab 11/10/22 0139  ST 10*  LT 17  LKPHOS 78  BILITOT 0.5  PROT 6.6  LBUMIN 2.8*    CBG: Recent Labs  Lab 11/10/22 0726 11/10/22 1150 11/10/22 1647 11/10/22 2109 11/11/22 0738  GLUCP 201* 81 194* 146* 152*     No results found for this or any previous visit (from the past 240 hour(s)).       Radiology Studies: CT HED WO CONTRST (<MESUREMENT>)  Result Date: 11/09/2022 CLINICL DT:  Delirium EXM: CT HED WITHOUT CONTRST TECHNIQUE: Contiguous axial images were obtained from the base of the skull through the vertex without intravenous contrast. RDITION DOSE REDUCTION: This exam was performed according to the departmental dose-optimization program which includes automated exposure control, adjustment of the m and/or kV according to patient size and/or use of iterative reconstruction technique. COMPRISON:  08/09/2015 FINDINGS: Brain: No acute intracranial abnormality. Specifically, no hemorrhage, hydrocephalus, mass lesion, acute infarction, or significant intracranial injury. Vascular: No hyperdense vessel or unexpected calcification. Skull: No acute calvarial abnormality. Sinuses/Orbits: No acute findings Other: None IMPRESSION: Normal study. Electronically Signed   By: Charlett Nose M.D.   On: 11/09/2022 21:30        Scheduled Meds:  atorvastatin  10 mg Oral  Daily   buPROPion  225 mg Oral BID   folic acid  1 mg Oral Daily   heparin  5,000 Units Subcutaneous Q8H   insulin aspart  0-20 Units Subcutaneous TID WC   insulin aspart  0-5 Units Subcutaneous QHS   insulin aspart  4 Units Subcutaneous TID WC   insulin detemir  10 Units Subcutaneous BID   labetalol  300 mg Oral BID   loratadine  10 mg Oral Daily   losartan  100 mg Oral Daily   montelukast  10 mg Oral QHS   multivitamin with minerals  1 tablet Oral Daily   mycophenolate  720 mg Oral BID   pantoprazole  40 mg Oral Daily   predniSONE  5 mg Oral Q breakfast   spironolactone  50 mg Oral Daily   sulfamethoxazole-trimethoprim  1 tablet Oral Once per day on Monday Wednesday Friday   tacrolimus  4 mg Oral BID   Continuous Infusions:   LOS: 32 days    Time spent: oer 30 min    Lacretia Nicks, MD [facility]   To contact the attending provider between 7-7P or the covering provider during after hours 7P-7, please log into the web site [email or web address] and access using universal [place] password for that web site. If you do not have the password, please call the hospital operator.  11/11/2022, 9:28 M

## 2022-11-11 NOTE — Progress Notes (Signed)
Physical Therapy Treatment Patient Details Name: Linda Ellison MRN: [id number] DOB: Apr 22, 1975 Today's Date: 11/11/2022   History of Present Illness 47 y.o. female presents to [facility] hospital on 10/09/2022 with CAP. Pt transferred to ICU on 7/9, intubated 7/10. HDU initiated on 7/15 due to fluid overload. Bronch 7/18. Extubated 10/26/2022. PMH includes renal transplant, DMII, HTN, HLD, OSA, CAD, hypothyroidism, gastroparesis, anxiety, depression.    PT Comments  Pt was seen at bedside for treatment, and found BP readings as follow: 158/77 supine, sitting 140/90, standing 93/59 (67) symptomatic, and post sidestepping on bedside for safety was 137/91 .  Pulses were WFL, sats 92-93%.  Pt is mildly SOB with the effort, noted tremors in hands with all activity.  Pt is expecting to go home today, and will continue to recommend her to <3 hours a day rehab inpt.  Follow as stay permits for goals of acute PT.   If plan is discharge home, recommend the following: A little help with walking and/or transfers;Assistance with cooking/housework;Assist for transportation;Help with stairs or ramp for entrance   Can travel by private vehicle     No  Equipment Recommendations  Wheelchair (measurements PT);Wheelchair cushion (measurements PT);Rolling walker (2 wheels);BSC/3in1;Other (comment);Hospital bed    Recommendations for Other Services OT consult     Precautions / Restrictions Precautions Precautions: Fall Precaution Comments: LUE AV fistula, watch BP Restrictions Weight Bearing Restrictions: No     Mobility  Bed Mobility Overal bed mobility: Needs Assistance Bed Mobility: Rolling, Sidelying to Sit Rolling: Supervision, Used rails Sidelying to sit: Used rails, Contact guard assist            Transfers Overall transfer level: Needs assistance Equipment used: 1 person hand held assist Transfers: Sit to/from Stand Sit to Stand: Mod assist           General transfer comment: mod to power  up and transition to the walker    Ambulation/Gait Ambulation/Gait assistance: Contact guard assist Gait Distance (Feet): 12 Feet Assistive device: Rolling walker (2 wheels) Gait Pattern/deviations: Step-to pattern Gait velocity: reduced Gait velocity interpretation: <1.31 ft/sec, indicative of household ambulator Pre-gait activities: standing balance ck General Gait Details: steps on side of bed   Stairs             Wheelchair Mobility     Tilt Bed    Modified Rankin (Stroke Patients Only)       Balance                                            Cognition Arousal: Alert Behavior During Therapy: Flat affect Overall Cognitive Status: Impaired/Different from baseline Area of Impairment: Safety/judgement, Awareness, Problem solving                         Safety/Judgement: Decreased awareness of safety, Decreased awareness of deficits Awareness: Emergent, Intellectual Problem Solving: Slow processing General Comments: pt is light headed and not telling PT unless asked about symptoms        Exercises      General Comments General comments (skin integrity, edema, etc.): Pt was seen for progression of mobility to get up and balance, ck of BP reveals both symptoms and orthostatic findings      Pertinent Vitals/Pain Pain Assessment Pain Assessment: No/denies pain    Home Living  Prior Function            PT Goals (current goals can now be found in the care plan section) Acute Rehab PT Goals Patient Stated Goal: get home today Progress towards PT goals: Progressing toward goals    Frequency    Min 1X/week      PT Plan Current plan remains appropriate    Co-evaluation              AM-PAC PT "6 Clicks" Mobility   Outcome Measure  Help needed turning from your back to your side while in a flat bed without using bedrails?: A Little Help needed moving from lying on your back  to sitting on the side of a flat bed without using bedrails?: A Little Help needed moving to and from a bed to a chair (including a wheelchair)?: A Lot Help needed standing up from a chair using your arms (e.g., wheelchair or bedside chair)?: A Lot Help needed to walk in hospital room?: A Lot Help needed climbing 3-5 steps with a railing? : Total 6 Click Score: 13    End of Session Equipment Utilized During Treatment: Gait belt Activity Tolerance: Treatment limited secondary to medical complications (Comment) Patient left: in bed;with call bell/phone within reach;with family/visitor present;with nursing/sitter in room Nurse Communication: Mobility status;Other (comment) (BP readings) PT Visit Diagnosis: Other abnormalities of gait and mobility (R26.89);Muscle weakness (generalized) (M62.81);Difficulty in walking, not elsewhere classified (R26.2)     Time: 1610-9604 PT Time Calculation (min) (ACUTE ONLY): 24 min  Charges:    $Gait Training: 8-22 mins $Therapeutic Activity: 8-22 mins PT General Charges $$ ACUTE PT VISIT: 1 Visit           Ivar Drape 11/11/2022, 2:30 PM  Samul Dada, PT PhD Acute Rehab Dept. Number: [facility] [id number] and [facility] [phone]

## 2022-11-11 NOTE — Progress Notes (Signed)
Nutrition Follow-up  DOCUMENTATION CODES:   Obesity unspecified  INTERVENTION:   Continue CM diet   Encourage po intake    NUTRITION DIAGNOSIS:   Inadequate oral intake related to inability to eat as evidenced by NPO status. -ongoing   GOAL:   Provide needs based on ASPEN/SCCM guidelines -progressing   MONITOR:   TF tolerance, I & O's, Vent status, Labs  REASON FOR ASSESSMENT:   Rounds, Ventilator, Consult Enteral/tube feeding initiation and management  ASSESSMENT:   Pt with hx of prior ESRD on HD now with renal transplant 2018, DM type 2, HTN, HLD, CAD, gastroparesis, and GERD presented to ED with weakness and SOB. Imaging suggestive of pneumonia.  Visited patient at bedside whose significant other was at bedside. Patient has very flat affect. She reports she has been eating better and does not require any ONS.   She denies N/V/D/C.   Labs: CBG 160, Na 134, Cr 1.36, Mag 1.6 Meds: reviewed   PO: 63% avg po x last 5 documented meals   I/O's:  -3.3 L since admission    Diet Order:   Diet Order             Diet Carb Modified Fluid consistency: Thin; Room service appropriate? Yes  Diet effective now                   EDUCATION NEEDS:   Not appropriate for education at this time  Skin:  Skin Assessment: Reviewed RN Assessment  Last BM:  8/5  Height:   Ht Readings from Last 1 Encounters:  10/21/22 5\' 6"  (1.676 m)    Weight:   Wt Readings from Last 1 Encounters:  11/09/22 98.7 kg    Ideal Body Weight:  59.1 kg  BMI:  Body mass index is 35.12 kg/m.  Estimated Nutritional Needs:   Kcal:  1800-2100kcal/d  Protein:  100-120g/d  Fluid:  1.8L/d  Leodis Rains, RDN, LDN  Clinical Nutrition

## 2022-11-12 LAB — GLUCOSE, CAPILLARY
Glucose-Capillary: 155 mg/dL — ABNORMAL HIGH (ref 70–99)
Glucose-Capillary: 61 mg/dL — ABNORMAL LOW (ref 70–99)
Glucose-Capillary: 96 mg/dL (ref 70–99)
Glucose-Capillary: 133 mg/dL — ABNORMAL HIGH (ref 70–99)
Glucose-Capillary: 190 mg/dL — ABNORMAL HIGH (ref 70–99)

## 2022-11-12 LAB — BASIC METABOLIC PANEL
Anion gap: 11 (ref 5–15)
BUN: 12 mg/dL (ref 6–20)
CO2: 17 mmol/L — ABNORMAL LOW (ref 22–32)
Calcium: 9 mg/dL (ref 8.9–10.3)
Chloride: 108 mmol/L (ref 98–111)
Creatinine, Ser: 1.38 mg/dL — ABNORMAL HIGH (ref 0.44–1.00)
GFR, Estimated: 48 mL/min — ABNORMAL LOW (ref >60)
Glucose, Bld: 148 mg/dL — ABNORMAL HIGH (ref 70–99)
Potassium: 3.9 mmol/L (ref 3.5–5.1)
Sodium: 136 mmol/L (ref 135–145)

## 2022-11-12 MED ORDER — GABAPENTIN 100 MG PO CAPS
100.0000 mg | ORAL_CAPSULE | Freq: Every day | ORAL | Status: DC
  Administered 2022-11-12: 100 mg via ORAL
  Filled 2022-11-12: qty 1

## 2022-11-12 NOTE — TOC Progression Note (Signed)
Transition of Care [facility]) - Progression Note    Patient Details  Name: Linda Ellison MRN: [id number] Date of Birth: 12-Jan-1976  Transition of Care [facility]) CM/SW Contact  Tom-Johnson, Hershal Coria, RN Phone Number: 11/12/2022, 4:33 PM  Clinical Narrative:     CM updated Kandee Keen at [facility] about patient's planned discharge home with Home Health disciplines. DME's ordered from [place] to deliver to patient's home.   CM will continue to follow as patient progresses with care towards discharge.       Expected Discharge Plan and Services                                               Social Determinants of Health (SDOH) Interventions SDOH Screenings   Food Insecurity: No Food Insecurity (11/07/2022)  Housing: Medium Risk (11/07/2022)  Transportation Needs: No Transportation Needs (11/07/2022)  Utilities: Not At Risk (11/07/2022)  Alcohol Screen: Low Risk  (11/21/2017)  Financial Resource Strain: Low Risk  (05/13/2021)   [facility], [facility] visits prior to 06/05/2022., [facility] visits prior to 06/05/2022.  Physical Activity: Unknown (05/13/2021)   [facility] from [facility], [facility] visits prior to 06/05/2022., [facility] visits prior to 06/05/2022.  Social Connections: Unknown (08/16/2021)   Received from [facility], [facility]  Stress: No Stress Concern Present (05/13/2021)   [facility], [facility] visits prior to 06/05/2022., [facility] visits prior to 06/05/2022.  Tobacco Use: Low Risk  (10/10/2022)    Readmission Risk Interventions    10/11/2022    3:46 PM  Readmission Risk Prevention Plan  Transportation Screening Complete  Medication Review (RN Care Manager) Referral to Pharmacy  PCP or Specialist appointment within 3-5 days of discharge Complete   HRI or Home Care Consult Complete  SW Recovery Care/Counseling Consult Complete  Palliative Care Screening Not Applicable  Skilled Nursing Facility Not Applicable

## 2022-11-12 NOTE — Progress Notes (Signed)
**Note De-Identified vi Obfusction** PROGRESS NOTE    Linda Ellison  NFA:[id number] DOB: 10/31/1975 DOA: 10/09/2022 PCP: Dylene Ktym, PA  Chief Complint  Ptient presents with   Chest Pin    Brief Nrrtive:   16 yer old with  history of renl trnsplnt who ws dmitted to the hospitl 7/6 with  multifocl pneumoni nd ultimtely required mechnicl ventiltion.  Her hospitliztion ws complicted by cute renl filure which trnsiently required dilysis, but her renl function subsequently improved.  She completed ntibiotics for her pneumoni, ws successfully extubted, nd wened to room ir s of 7/26.  She ws trnsferred to the hospitlist service 7/26.   Significnt Events 7/6 dmission 7/9 trnsferred to ICU 7/10 intubted 7/15 worsening renl function with fluid overlod -hemodilysis initited 7/17 endotrchel tube exchnge due to mucous plugging 7/18 emergent bronchoscopy for mucous plugging 7/19 Cleviprex required due to severe hypertension -repet episode of mucous plugging 7/22 wened on pressure support with copious secretions noted 7/23 extubted but required BiPAP 7/24 Cleviprex drip resumed 7/26 trnsferred to 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   She's now declining SNF s well.  Hd extensive discussion with ptient, sig other, unt.  We recommended she go somewhere they'll be ble to do more intense rehb thn home, but she remins uninterested.  Will work on rrnging home helth.  Pln for dischrge 8/10.  Acute hypoxic respirtory filure due to ARDS complicted by frequent mucous plugging Now resolved with ptient successfully wened to room ir   Aspirtion pneumonitis Occurred during the night 7/30-7/31 - ppers to hve been  self-limited minor event -no recurrence with ptient stble from  respirtory stndpoint   Multifocl pneumoni in immunosuppressed ptient Hs completed  course of ntibiotic therpy -no clinicl signs of ctive infection t present  Orthosttic hypotension Supine Hypertension there my be  need to tolerte higher blood pressure trnsiently until outptient follow-up - counseled ptient on need to mke very slow positionl chnges nd to be cogniznt of potentil for syncope  Difficult issue, elevted hed of bed, compression stocking, bdominl binder Will tolerte higher BP  Lst orthosttics from 8/7 with drop in SBP from 160 to 90's.   Resistnt hypertension Elevted blood pressure continues to be n issue -s noted bove she hs not tolerted further titrtion of blood pressure mediction due to significnt orthostsis - t this time we re hving to ccept elevted blood pressure redings to void orthostsis tht occurs when upwrd titrtion of blood pressure medictions is **Note De-Identified vi Obfusction** ttempted - once ptient becomes more mobile nd when her orthostsis improves, further BP  med titrtion should be possible   Acute renl filure of  trnsplnted kidney  Fortuntely this hs resolved with the ptient's renl function recovering - she did require dilysis during this hospitl sty - continue her usul immunosuppressive medictions - cretinine presently norml   Hypomgnesemi Replce nd follow   DM2  ptient told the DM Coordintor tht she did not remember how to operte the pump Now using bsl/bolus insulin   Septic shock - resolved Due to bove   Tremor ? Essentil tremor, predtes this hospitliztion Will tril primidone  Acute metbolic encephlopthy due to multifocl pneumoni Hed CT without cute bnormlity Norml TSH Elevted B12 (10/2022) Follow mmoni, follow b1, follow RPR Will refer to neurology outptient for cognitive decline, consider MRI (either outptient or inptient) - unt feels she's grdully improving, will hold off   Environmentl exposure to bthroom mold/mildew ntihistmine      DVT prophylxis: heprin Code Sttus: full Fmily Communiction: sig other, unt Disposition:   Sttus is: Inptient Remins inptient pproprite becuse: need for continued inptient cre   Consultnts:  PCCM renl  Procedures:  See bove  Antimicrobils:  Anti-infectives (From dmission, onwrd)    Strt     Dose/Rte Route Frequency Ordered Stop   10/29/22 1000  sulfmethoxzole-trimethoprim (BACTRIM) 400-80 MG per tblet 1 tblet        1 tblet Orl Once per dy on Mondy Wednesdy Fridy 10/27/22 1317     10/23/22 1545  fluconzole (DIFLUCAN) IVPB 200 mg  Sttus:  Discontinued        200 mg 100 mL/hr over 60 Minutes Intrvenous Every 24 hours 10/23/22 1449 10/23/22 1449   10/23/22 1545  fluconzole (DIFLUCAN) IVPB 200 mg  Sttus:  Discontinued        200 mg 100 mL/hr over 60 Minutes Intrvenous Every 24 hours 10/23/22 1449 10/23/22 1455   10/18/22 1000  sulfmethoxzole-trimethoprim (BACTRIM) 400-80 MG per tblet 1  tblet  Sttus:  Discontinued        1 tblet Per Tube Once per dy on Mondy Wednesdy Fridy 10/17/22 1108 10/27/22 1317   10/16/22 1200  sulfmethoxzole-trimethoprim (BACTRIM) 400 mg of trimethoprim in dextrose 5 % 500 mL IVPB  Sttus:  Discontinued        400 mg of trimethoprim 350 mL/hr over 90 Minutes Intrvenous Every 6 hours 10/16/22 1041 10/17/22 1018   10/15/22 1400  sulfmethoxzole-trimethoprim (BACTRIM) 550 mg of trimethoprim in dextrose 5 % 1,000 mL IVPB  Sttus:  Discontinued        550 mg of trimethoprim 689.6 mL/hr over 90 Minutes Intrvenous Every 8 hours 10/15/22 0937 10/15/22 0940   10/15/22 1200  sulfmethoxzole-trimethoprim (BACTRIM) 550 mg of trimethoprim in dextrose 5 % 1,000 mL IVPB  Sttus:  Discontinued        550 mg of trimethoprim 689.6 mL/hr over 90 Minutes Intrvenous Every 6 hours 10/15/22 0940 10/16/22 1041   10/13/22 1030  sulfmethoxzole-trimethoprim (BACTRIM) 1,000 mg of trimethoprim in dextrose 5 % 1,000 mL IVPB  Sttus:  Discontinued        1,000 mg of trimethoprim 708.3 mL/hr over 90 Minutes Intrvenous Every 6 hours 10/13/22 0942 10/13/22 0945   10/13/22 1030  sulfmethoxzole-trimethoprim (BACTRIM) 550 mg of trimethoprim in dextrose 5 % 1,000 mL IVPB  Sttus:  Discontinued        550 mg of trimethoprim 689.6 mL/hr over 90 Minutes Intrvenous Every 6 hours 10/13/22 0945  10/15/22 0937   10/13/22 1000  doxycycline (VIBRA-TABS) tablet 100 mg  Status:  Discontinued        100 mg Per Tube Every 12 hours 10/13/22 0731 10/13/22 0954   10/12/22 1600  piperacillin-tazobactam (ZOSYN) IVPB 3.375 g        3.375 g 12.5 mL/hr over 240 Minutes Intravenous Every 8 hours 10/12/22 1516 10/18/22 2030   10/11/22 1230  ceFEPIme (MAXIPIME) 2 g in sodium chloride 0.9 % 100 mL IVPB  Status:  Discontinued        2 g 200 mL/hr over 30 Minutes Intravenous Every 12 hours 10/11/22 1158 10/12/22 1508   10/11/22 1000  doxycycline (VIBRA-TABS) tablet 100 mg  Status:  Discontinued         100 mg Oral Daily 10/10/22 0117 10/10/22 0126   10/11/22 0000  ceFEPIme (MAXIPIME) 2 g in sodium chloride 0.9 % 100 mL IVPB  Status:  Discontinued        2 g 200 mL/hr over 30 Minutes Intravenous Every 24 hours 10/10/22 0122 10/10/22 0123   10/11/22 0000  ceFEPIme (MAXIPIME) 1 g in sodium chloride 0.9 % 100 mL IVPB  Status:  Discontinued        1 g 200 mL/hr over 30 Minutes Intravenous Every 24 hours 10/10/22 0123 10/11/22 1158   10/10/22 0215  doxycycline (VIBRA-TABS) tablet 100 mg  Status:  Discontinued        100 mg Oral Every 12 hours 10/10/22 0126 10/13/22 0730   10/09/22 2315  ceFEPIme (MAXIPIME) 2 g in sodium chloride 0.9 % 100 mL IVPB        2 g 200 mL/hr over 30 Minutes Intravenous  Once 10/09/22 2303 10/10/22 0135   10/09/22 2130  doxycycline (VIBRAMYCIN) 100 mg in sodium chloride 0.9 % 250 mL IVPB        100 mg 125 mL/hr over 120 Minutes Intravenous  Once 10/09/22 2115 10/10/22 0011   10/09/22 2053  ceFEPIme (MAXIPIME) 1 g in sodium chloride 0.9 % 100 mL IVPB  Status:  Discontinued        1 g 200 mL/hr over 30 Minutes Intravenous Every 24 hours 10/09/22 2054 10/09/22 2303   10/09/22 2045  ceFEPIme (MAXIPIME) 2 g in sodium chloride 0.9 % 100 mL IVPB  Status:  Discontinued        2 g 200 mL/hr over 30 Minutes Intravenous Every 24 hours 10/09/22 2040 10/09/22 2054       Subjective: No complaints  Objective: Vitals:   11/11/22 1633 11/11/22 2144 11/12/22 0422 11/12/22 0903  BP: (!) 168/91 (!) 164/96 (!) 187/93 (!) 185/98  Pulse: 86 98 94 91  Resp: 18 16 20 18   Temp: 98 F (36.7 C) 98.3 F (36.8 C) 98.4 F (36.9 C) 98.2 F (36.8 C)  TempSrc:   Oral   SpO2: 91% 98% 93% 94%  Weight:      Height:       No intake or output data in the 24 hours ending 11/12/22 1204  Filed Weights   11/04/22 0622 11/08/22 0421 11/09/22 0523  Weight: 105.3 kg 99.2 kg 98.7 kg    Examination:  General: No acute distress. Cardiovascular: RRR Lungs: unlabored Abdomen: Soft,  nontender, nondistended Neurological: Alert and oriented 3. Moves all extremities 4 with equal strength. Cranial nerves II through XII grossly intact. Extremities: bilateral trace LE edema   Data Reviewed: I have personally reviewed following labs and imaging studies  CBC: Recent Labs  Lab 11/09/22 0428  11/10/22 0139 11/11/22 0151  WBC 5.8 5.9 5.4  HGB 10.1* 9.9* 10.4*  HCT 32.9* 32.8* 33.9*  MCV 83.7 81.6 81.1  PLT 382 362 396    Basic Metabolic Panel: Recent Labs  Lab 11/09/22 0428 11/10/22 0139 11/11/22 0151 11/12/22 0814  N 136 132* 134* 136  K 4.1 3.6 3.6 3.9  CL 106 104 105 108  CO2 17* 17* 17* 17*  GLUCOSE 135* 197* 153* 148*  BUN 11 11 10 12   CRETININE 1.00 1.20* 1.36* 1.38*  CLCIUM 9.0 8.8* 9.0 9.0  MG 1.2* 2.0 1.6*  --   PHOS  --   --  4.5  --     GFR: Estimated Creatinine Clearance: 60.4 mL/min () (by C-G formula based on SCr of 1.38 mg/dL (H)).  Liver Function Tests: Recent Labs  Lab 11/10/22 0139  ST 10*  LT 17  LKPHOS 78  BILITOT 0.5  PROT 6.6  LBUMIN 2.8*    CBG: Recent Labs  Lab 11/11/22 0738 11/11/22 1121 11/11/22 1631 11/11/22 2203 11/12/22 0728  GLUCP 152* 160* 86 119* 155*     No results found for this or any previous visit (from the past 240 hour(s)).       Radiology Studies: No results found.      Scheduled Meds:  atorvastatin  10 mg Oral Daily   buPROPion  225 mg Oral BID   folic acid  1 mg Oral Daily   heparin  5,000 Units Subcutaneous Q8H   insulin aspart  0-20 Units Subcutaneous TID WC   insulin aspart  0-5 Units Subcutaneous QHS   insulin aspart  4 Units Subcutaneous TID WC   insulin detemir  10 Units Subcutaneous BID   labetalol  300 mg Oral BID   losartan  100 mg Oral Daily   montelukast  10 mg Oral QHS   multivitamin with minerals  1 tablet Oral Daily   mycophenolate  720 mg Oral BID   pantoprazole  40 mg Oral Daily   predniSONE  5 mg Oral Q breakfast   spironolactone  50 mg Oral  Daily   sulfamethoxazole-trimethoprim  1 tablet Oral Once per day on Monday Wednesday Friday   tacrolimus  4 mg Oral BID   Continuous Infusions:   LOS: 33 days    Time spent: oer 30 min    Lacretia Nicks, MD [facility]   To contact the attending provider between 7-7P or the covering provider during after hours 7P-7, please log into the web site [email or web address] and access using universal [place] password for that web site. If you do not have the password, please call the hospital operator.  11/12/2022, 12:04 PM

## 2022-11-12 NOTE — Progress Notes (Signed)
Hypoglycemic Event  CBG: 61  Treatment: 8 oz juice/soda  Symptoms: None  Follow-up CBG: Time:1320 CBG Result:96  Possible Reasons for Event: Inadequate meal intake  Comments/MD notified:Pt ate lunch after CBG of 61     [name]

## 2022-11-13 ENCOUNTER — Other Ambulatory Visit (HOSPITAL_COMMUNITY): Payer: Self-pay

## 2022-11-13 LAB — GLUCOSE, CAPILLARY
Glucose-Capillary: 182 mg/dL — ABNORMAL HIGH (ref 70–99)
Glucose-Capillary: 189 mg/dL — ABNORMAL HIGH (ref 70–99)

## 2022-11-13 MED ORDER — GABAPENTIN 100 MG PO CAPS
100.0000 mg | ORAL_CAPSULE | Freq: Every day | ORAL | 1 refills | Status: DC
  Filled 2022-11-13: qty 30, 30d supply, fill #0

## 2022-11-13 MED ORDER — INSULIN ASPART 100 UNIT/ML FLEXPEN
0.0000 [IU] | PEN_INJECTOR | Freq: Three times a day (TID) | SUBCUTANEOUS | 11 refills | Status: DC
Start: 2022-11-13 — End: 2022-12-03
  Filled 2022-11-13: qty 15, 30d supply, fill #0

## 2022-11-13 MED ORDER — ONETOUCH VERIO FLEX SYSTEM W/DEVICE KIT
1.0000 | PACK | Freq: Three times a day (TID) | 0 refills | Status: DC
  Filled 2022-11-13: qty 1, 30d supply, fill #0

## 2022-11-13 MED ORDER — SPIRONOLACTONE 50 MG PO TABS
50.0000 mg | ORAL_TABLET | Freq: Every day | ORAL | 1 refills | Status: DC
  Filled 2022-11-13: qty 30, 30d supply, fill #0

## 2022-11-13 MED ORDER — ATORVASTATIN CALCIUM 10 MG PO TABS
10.0000 mg | ORAL_TABLET | Freq: Every day | ORAL | 1 refills | Status: DC
  Filled 2022-11-13: qty 30, 30d supply, fill #0

## 2022-11-13 MED ORDER — MAGNESIUM OXIDE 400 MG PO TABS
400.0000 mg | ORAL_TABLET | Freq: Every day | ORAL | 0 refills | Status: DC
Start: 2022-11-13 — End: 2022-12-03
  Filled 2022-11-13: qty 120, 120d supply, fill #0

## 2022-11-13 MED ORDER — MYCOPHENOLATE SODIUM 180 MG PO TBEC
720.0000 mg | DELAYED_RELEASE_TABLET | Freq: Two times a day (BID) | ORAL | 0 refills | Status: DC
  Filled 2022-11-13: qty 240, 30d supply, fill #0

## 2022-11-13 MED ORDER — TACROLIMUS 1 MG PO CAPS
4.0000 mg | ORAL_CAPSULE | Freq: Two times a day (BID) | ORAL | 0 refills | Status: DC
  Filled 2022-11-13: qty 240, 30d supply, fill #0

## 2022-11-13 MED ORDER — FOLIC ACID 1 MG PO TABS
1.0000 mg | ORAL_TABLET | Freq: Every day | ORAL | 1 refills | Status: DC
  Filled 2022-11-13: qty 30, 30d supply, fill #0

## 2022-11-13 MED ORDER — TACROLIMUS 1 MG PO CAPS: 4.0000 mg | ORAL_CAPSULE | Freq: Two times a day (BID) | ORAL | Status: DC

## 2022-11-13 MED ORDER — BLOOD GLUCOSE TEST VI STRP
1.0000 | ORAL_STRIP | Freq: Three times a day (TID) | 0 refills | Status: DC
  Filled 2022-11-13: qty 100, 30d supply, fill #0

## 2022-11-13 MED ORDER — CETIRIZINE HCL 10 MG PO TABS: 10.0000 mg | ORAL_TABLET | Freq: Every day | ORAL | Status: DC

## 2022-11-13 MED ORDER — ACETAMINOPHEN 325 MG PO TABS: 650.0000 mg | ORAL_TABLET | Freq: Four times a day (QID) | ORAL | Status: DC | PRN

## 2022-11-13 MED ORDER — INSULIN PEN NEEDLE 32G X 4 MM MISC
1.0000 | Freq: Three times a day (TID) | 0 refills | Status: DC
  Filled 2022-11-13: qty 100, 30d supply, fill #0

## 2022-11-13 MED ORDER — PREDNISONE 5 MG PO TABS
5.0000 mg | ORAL_TABLET | Freq: Every day | ORAL | 0 refills | Status: DC
  Filled 2022-11-13: qty 30, 30d supply, fill #0

## 2022-11-13 MED ORDER — OXYCODONE HCL 5 MG PO TABS
5.0000 mg | ORAL_TABLET | Freq: Three times a day (TID) | ORAL | 0 refills | Status: AC | PRN
  Filled 2022-11-13: qty 9, 3d supply, fill #0

## 2022-11-13 MED ORDER — BASAGLAR KWIKPEN 100 UNIT/ML ~~LOC~~ SOPN
7.0000 [IU] | PEN_INJECTOR | Freq: Two times a day (BID) | SUBCUTANEOUS | 2 refills | Status: DC
Start: 2022-11-13 — End: 2022-12-03
  Filled 2022-11-13: qty 9, 64d supply, fill #0

## 2022-11-13 MED ORDER — LOSARTAN POTASSIUM 100 MG PO TABS
100.0000 mg | ORAL_TABLET | Freq: Every day | ORAL | 1 refills | Status: DC
  Filled 2022-11-13: qty 30, 30d supply, fill #0

## 2022-11-13 MED ORDER — SULFAMETHOXAZOLE-TRIMETHOPRIM 400-80 MG PO TABS
1.0000 | ORAL_TABLET | ORAL | 1 refills | Status: DC
  Filled 2022-11-13: qty 12, 28d supply, fill #0

## 2022-11-13 MED ORDER — LANCET DEVICE MISC
1.0000 | Freq: Three times a day (TID) | 0 refills | Status: DC
  Filled 2022-11-13: qty 1, fill #0

## 2022-11-13 MED ORDER — BUPROPION HCL 75 MG PO TABS
225.0000 mg | ORAL_TABLET | Freq: Two times a day (BID) | ORAL | 1 refills | Status: DC
Start: 2022-11-13 — End: 2022-12-03
  Filled 2022-11-13: qty 80, 13d supply, fill #0

## 2022-11-13 MED ORDER — LABETALOL HCL 300 MG PO TABS
300.0000 mg | ORAL_TABLET | Freq: Two times a day (BID) | ORAL | 1 refills | Status: DC
  Filled 2022-11-13: qty 60, 30d supply, fill #0

## 2022-11-13 MED ORDER — MAGNESIUM GLUCONATE 500 MG PO TABS
500.0000 mg | ORAL_TABLET | Freq: Once | ORAL | Status: AC
  Administered 2022-11-13: 500 mg via ORAL
  Filled 2022-11-13: qty 1

## 2022-11-13 NOTE — Discharge Summary (Signed)
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)   Hypoklemi   Acute hypoxic respirtory filure (HCC)   Asthm, chronic, unspecified sthm severity, with cute excerbtion   GERD (gstroesophgel reflux disese)   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)   History of CAD (coronry rtery disese)   CAD (coronry rtery disese)   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  Her hospitliztion ws complicted by cute renl filure which trnsiently required dilysis, but her renl function subsequently improved.  She completed ntibiotics for her pneumoni, ws successfully extubted, nd wened to  room ir s of 7/26.  She ws trnsferred to the hospitlist service 7/26.    Significnt Events 7/6 dmission 7/9 trnsferred to ICU 7/10 intubted 7/15 worsening renl function with fluid overlod -hemodilysis initited 7/17 endotrchel tube exchnge due to mucous plugging 7/18 emergent bronchoscopy for mucous plugging 7/19 Cleviprex required due to severe hypertension -repet episode of mucous plugging 7/22 wened on pressure support with copious secretions noted 7/23 extubted but required BiPAP 7/24 Cleviprex drip resumed 7/26 [facility]fer[facility] to [facility] -witing rehb plcement  Dischrging to home tody with home helth fter extensive ttempts t inptient rehb vs skilled.  She declined both of these, will pursue home.    Hospitl Course:  Assessment nd Pln:  Generlized wekness/deconditioning The recommendtion by therpy hs been for inptient rehb but the ptient is out of network for our in-house rehb unit - the possibility of inptient rehb in [facility] ws investigted but the ptient is not interested.   She's now declining SNF s well.  Hd extensive discussion with ptient, sig other, unt.  We recommended she go somewhere they'll be ble to do more intense rehb thn home, but she remins uninterested.  Will work on rrnging home helth.  Pln for dischrge 8/10.   Acute hypoxic respirtory filure due to ARDS complicted by frequent mucous plugging Now resolved with ptient successfully wened to room ir   Aspirtion pneumonitis Occurred during the night 7/30-7/31 - ppers to hve been  self-limited minor event -no recurrence with ptient stble from  respirtory stndpoint   Multifocl pneumoni in immunosuppressed ptient Hs completed  course of ntibiotic therpy -no clinicl signs of ctive infection t present   Orthosttic hypotension Supine Hypertension there my be  need to tolerte **Note De-Identified vi Obfusction** higher blood pressure trnsiently until outptient  follow-up - counseled ptient on need to mke very slow positionl chnges nd to be cogniznt of potentil for syncope  Difficult issue, elevted hed of bed, compression stocking, bdominl binder Will tolerte higher BP  Lst orthosttics from 8/7 with drop in SBP from 160 to 90's.   Resistnt hypertension Elevted blood pressure continues to be n issue -s noted bove she hs not tolerted further titrtion of blood pressure mediction due to significnt orthostsis - t this time we re hving to ccept elevted blood pressure redings to void orthostsis tht occurs when upwrd titrtion of blood pressure medictions is ttempted - once ptient becomes more mobile nd when her orthostsis improves, further BP med titrtion should be possible    Acute renl filure of  trnsplnted kidney  Fortuntely this hs resolved with the ptient's renl function recovering - she did require dilysis during this hospitl sty - continue her usul immunosuppressive medictions - cretinine presently stble   Hypomgnesemi Replce nd follow   DM2  ptient told the DM Coordintor tht she did not remember how to operte the pump Now using bsl/bolus insulin   Septic shock - resolved Due to bove   Tremor ? Essentil tremor, predtes this hospitliztion Will tril gbpentin Follow with neurology outptient    Acute metbolic encephlopthy due to multifocl pneumoni Hed CT without cute bnormlity Norml TSH Elevted B12 (10/2022) Follow mmoni, follow b1 (pending t time of dischrge), no RPR checked, consider follow outptient  Will refer to neurology outptient for cognitive decline, consider MRI (either outptient) - unt feels she's grdully improving, will hold off   Environmentl exposure to bthroom mold/mildew ntihistmine     Procedures: See bove   Consulttions: PCCM renl  Dischrge Exm: Vitls:   11/13/22 1038 11/13/22 1058  BP: 106/89   Pulse: (!)  112 96  Resp:    Temp:    SpO2:     No complints  Generl: No cute distress. Crdiovsculr: RRR Lungs: unlbored Abdomen: Soft, nontender, nondistended  Neurologicl: Alert nd oriented 3. Moves ll extremities 4 with equl strength. Crnil nerves II through XII grossly intct. Extremities: trce LE edem  Dischrge Instructions   Dischrge Instructions     (HEART FAILURE PATIENTS) Cll MD:  Anytime you hve ny of the following symptoms: 1) 3 pound weight gin in 24 hours or 5 pounds in 1 week 2) shortness of breth, with or without  dry hcking cough 3) swelling in the hnds, feet or stomch 4) if you hve to sleep on extr pillows t night in order to brethe.   Complete by: As directed    Ambultory referrl to Neurology   Complete by: As directed    An ppointment is requested in pproximtely: 4 weeks   Cll MD for:  difficulty brething, hedche or visul disturbnces   Complete by: As directed    Cll MD for:  extreme ftigue   Complete by: As directed    Cll MD for:  hives   Complete by: As directed    Cll MD for:  persistnt dizziness or light-hededness   Complete by: As directed    Cll MD for:  persistnt nuse nd vomiting   Complete by: As directed    Cll MD for:  redness, tenderness, or signs of infection (pin, swelling, redness, odor or green/yellow dischrge round incision site)   Complete by: As directed    Cll MD for:  severe uncontrolled pin   Complete by: **Note De-Identified vi Obfusction** As directed    Cll MD for:  temperture >100.4   Complete by: As directed    Diet - low sodium hert helthy   Complete by: As directed    Dischrge instructions   Complete by: As directed    You were seen for  respirtory filure, cute respirtory distress syndrome, nd pneumoni.  You've hd  prolonged nd complicted hospitl course, but overll hve improved.   We recommended inptient rehb or skilled nursing fcility for rehb, but you declined this.  We'll send you home  with home helth resources.    We've mde some chnges to your medicines.  Tke your medicines s prescribed.  I've refilled ll meds except the tcrolimus nd myfortic nd prednisone.  Plese check nd see whether you hve these t home before dischrging so we cn check.   Your blood pressures re elevted when you're lying down nd drop when you stnd.  Elevte the hed of your bed.  Spend time in  chir t home s tolerted.  Chnge positions from lying to sitting to stnding slowly.  Use the compression stockings nd bdominl binder when you're up nd bout.  Adjusting your blood pressure medicines is tricky due to the drop in your blood pressure when you stnd nd the high blood pressure when you're flt.  This will need to be continued to be djusted with your outptient doctor.    We'll send you home with insulin injections.  Keep trck of your blood sugrs so your outptient doctor cn djust the regimen s needed.  We'll refer you to neurology outptient for your tremor nd the cognitive decline we've noticed in the setting of your illness.  I sent  Linda Ellison level which is pending t the time of dischrge.  FOLLOW THIS UP with your outptient doctor.  It's importnt you follow up with your kidney doctor outptient so they cn follow up your levels nd djust the medictions s needed.  You'll need repet lbs outptient.  Follow with your PCP within  week or so.  Return for new, recurrent, or worsening symptoms.  As I mentioned bove, I think tht you'll need more help thn you'll hve t home, but since you've decided on home, we'll give you s much support s possible.  If home is not working, you my need to discuss plcement with your outptient provider.   Plese sk your PCP to request records from this hospitliztion so they know wht ws done nd wht the next steps will be.  Plese sk your PCP to request records from this hospitliztion so they know wht ws done nd wht  the next steps will be.   Increse ctivity slowly   Complete by: As directed       Allergies s of 11/13/2022       Rections   Ciprofloxcin Hives, Itching, Nuse And Vomiting   Fluocinolone Itching, Nuse And Vomiting, Other (See Comments)   Hydromorphone Hives   Strwberry Extrct Itching, Other (See Comments)   Pt sttes tht this mediction mkes her tongue rw.   No rection   Hydromorphone Hcl Hives   Phenytoin Sodium Extended Itching   Adhesive [tpe] Itching, Rsh   Chlorhexidine Gluconte Itching   Clindmycin Dirrhe, Nuse And Vomiting, Rsh   Shrimp [shellfish Allergy] Rsh        Mediction List     STOP tking these medictions    mLODipine 10 MG tblet Commonly known s: NORVASC   ARIPiprzole 30 MG tblet Commonly **Note De-Identified vi Obfusction** known s: ABILIFY   buPROPion 150 MG 24 hr tblet Commonly known s: WELLBUTRIN XL Replced by: buPROPion 75 MG tblet   buPROPion 300 MG 24 hr tblet Commonly known s: WELLBUTRIN XL   cinclcet 30 MG tblet Commonly known s: SENSIPAR   citloprm 40 MG tblet Commonly known s: CELEXA   cloNIDine 0.3 MG tblet Commonly known s: CATAPRES   hydrOXYzine 25 MG tblet Commonly known s: ATARAX   insulin sprt 100 UNIT/ML injection Commonly known s: novoLOG Replced by: insulin sprt 100 UNIT/ML FlexPen   insulin pump Soln   przosin 1 MG cpsule Commonly known s: MINIPRESS   simvsttin 10 MG tblet Commonly known s: ZOCOR   sodium bicrbonte 650 MG tblet   zolpidem 5 MG tblet Commonly known s: AMBIEN       TAKE these medictions    cetminophen 325 MG tblet Commonly known s: TYLENOL Tke 2 tblets (650 mg totl) by mouth every 6 (six) hours s needed for mild pin, fever or hedche.   lbuterol 108 (90 Bse) MCG/ACT inhler Commonly known s: VENTOLIN HFA Inhle 2 puffs into the lungs every 6 (six) hours s needed for wheezing or shortness of breth.   torvsttin 10 MG tblet Commonly  known s: LIPITOR Tke 1 tblet (10 mg totl) by mouth dily.   Blood Glucose Monitoring Suppl Devi 1 ech by Does not pply route 3 (three) times dily. My dispense ny mnufcturer covered by ptient's insurnce.   BLOOD GLUCOSE TEST STRIPS Strp 1 ech by Does not pply route 3 (three) times dily. Use s directed to check blood sugr. My dispense ny mnufcturer covered by ptient's insurnce nd fits ptient's device.   buPROPion 75 MG tblet Commonly known s: WELLBUTRIN Tke 3 tblets (225 mg totl) by mouth 2 (two) times dily. Replces: buPROPion 150 MG 24 hr tblet   cetirizine 10 MG tblet Commonly known s: ZYRTEC Tke 1 tblet (10 mg totl) by mouth dily. Wht chnged: when to tke this   Dulglutide 4.5 MG/0.5ML Sopn Inject 4.5 mg into the skin once  week. Every Tuesdy   folic cid 1 MG tblet Commonly known s: FOLVITE Tke 1 tblet (1 mg totl) by mouth dily.   gbpentin 100 MG cpsule Commonly known s: NEURONTIN Tke 1 cpsule (100 mg totl) by mouth t bedtime. Wht chnged:  mediction strength how much to tke when to tke this resons to tke this dditionl instructions   insulin sprt 100 UNIT/ML FlexPen Commonly known s: NOVOLOG Inject 0-9 Units into the skin 3 (three) times dily with mels. CBG < 70: tret for low blood sugr CBG 121 - 150: 0 units CBG 151 - 200: 1 unit CBG 201 - 250: 2 units CBG 251 - 300: 3 units  CBG 301 - 350: 5 units CBG 351 - 400: 7 units CBG > 400: 9 units nd cll MD Replces: insulin sprt 100 UNIT/ML injection   insulin detemir 100 UNIT/ML FlexPen Commonly known s: LEVEMIR Inject 7 Units into the skin 2 (two) times dily.   lbetlol 300 MG tblet Commonly known s: NORMODYNE Tke 1 tblet (300 mg totl) by mouth 2 (two) times dily. Wht chnged:  mediction strength how much to tke when to tke this dditionl instructions   Lncet Device Misc 1 ech by Does not pply route 3 (three)  times dily. My dispense ny mnufcturer covered by ptient's insurnce.   losrtn 100 MG tblet Commonly known s: COZAAR Tke 1 tblet (100 mg totl) **Note De-Identified vi Obfusction** by mouth dily.   Mgnesium 400 MG Cps Tke 400 mg by mouth dily.   montelukst 10 MG tblet Commonly known s: SINGULIR Tke 10 mg by mouth t bedtime.   mycophenolte 180 MG EC tblet Commonly known s: MYFORTIC Tke 4 tblets (720 mg totl) by mouth 2 (two) times dily. Follow with your kidney doctor for refills Wht chnged: dditionl instructions   nloxone 4 MG/0.1ML Liqd nsl spry kit Commonly known s: NRCN   oxyCODONE 5 MG immedite relese tblet Commonly known s: Oxy IR/ROXICODONE Tke 1 tblet (5 mg totl) by mouth every 8 (eight) hours s needed for up to 3 dys for severe pin. Wht chnged:  mediction strength how much to tke when to tke this resons to tke this dditionl instructions   pntoprzole 40 MG tblet Commonly known s: PROTONIX Tke 1 tblet (40 mg totl) by mouth dily. For cid reflux   Pen Needles 31G X 5 MM Misc 1 ech by Does not pply route 3 (three) times dily. My dispense ny mnufcturer covered by ptient's insurnce.   predniSONE 5 MG tblet Commonly known s: DELTSONE Tke 1 tblet (5 mg totl) by mouth dily with brekfst. Follow with your kidney doctor for refills Wht chnged: dditionl instructions   spironolctone 50 MG tblet Commonly known s: LDCTONE Tke 1 tblet (50 mg totl) by mouth dily.   sulfmethoxzole-trimethoprim 400-80 MG tblet Commonly known s: BCTRIM Tke 1 tblet by mouth every Mondy, Wednesdy, nd Fridy. For infection Strt tking on: ugust 12, 2024   tcrolimus 1 MG cpsule Commonly known s: Progrf Tke 4 cpsules (4 mg totl) by mouth 2 (two) times dily. Follow with your kidney doctor for refills Wht chnged:  how much to tke when to tke this dditionl instructions               [facility]  Equipment  (From dmission, onwrd)           Strt     Ordered   11/12/22 1211  For home use only DME Hospitl bed  Once       Question nswer Comment  Length of Need 12 Months   Bed type Semi-electric      11/12/22 1221   11/12/22 1211  For home use only DME Bedside commode  Once       Question:  Ptient needs  bedside commode to tret with the following condition  nswer:  Physicl deconditioning   11/12/22 1221   11/12/22 1211  For home use only DME stndrd mnul wheelchir with set cushion  Once       Comments: Ptient suffers from orthosttic hypotension which impirs their bility to perform dily ctivities like bthing, dressing, grooming, nd toileting in the home.   wlker will not resolve issue with performing ctivities of dily living.  wheelchir will llow ptient to sfely perform dily ctivities. Ptient cn sfely propel the wheelchir in the home or hs  cregiver who cn provide ssistnce. Length of need 12 months . ccessories: elevting leg rests (ELRs), wheel locks, extensions nd nti-tippers.   11/12/22 1221   11/06/22 0956  For home use only DME 3 n 1  Once        11/06/22 0955   11/06/22 0954  For home use only DME Wlker rolling  Once       Question nswer Comment  Wlker: With 5 Inch Wheels   Ptient needs  wlker to tret with the following condition Physicl deconditioning **Note De-Identified vi Obfusction** 11/06/22 0955   11/06/22 0954  For home use only DME stndrd mnul wheelchir with set cushion  Once       Comments: Ptient suffers from physicl deconditioning nd orthosttic hypotension which impirs their bility to perform dily ctivities like dressing, feeding, grooming, nd toileting in the home.   cne, crutch, or wlker will not resolve issue with performing ctivities of dily living.  wheelchir will llow ptient to sfely perform dily ctivities. Ptient cn sfely propel the wheelchir in the home or hs  cregiver who cn provide ssistnce. Length  of need 6 months . ccessories: elevting leg rests (ELRs), wheel locks, extensions nd nti-tippers.   11/06/22 0955           llergies  llergen Rections   Ciprofloxcin Hives, Itching nd Nuse nd Vomiting   Fluocinolone Itching, Nuse nd Vomiting nd Other (See Comments)   Hydromorphone Hives   Strwberry Extrct Itching nd Other (See Comments)    Pt sttes tht this mediction mkes her tongue rw.   No rection   Hydromorphone Hcl Hives   Phenytoin Sodium Extended Itching   dhesive [Tpe] Itching nd Rsh   Chlorhexidine Gluconte Itching   Clindmycin Dirrhe, Nuse nd Vomiting nd Rsh   Shrimp [Shellfish llergy] Rsh    Follow-up Informtion     [facility] Follow up.   Specilty: Home Helth Services Why: Someone will cll you to schedule first home visit. Contct informtion: [address] [place] [address] [phone]         Linda Ellison, Georgi Follow up.   Specilty: [facility] Contct informtion: [address] [place] [address] [phone]                  The results of significnt dignostics from this hospitliztion (including imging, microbiology, ncillry nd lbortory) re listed below for reference.    Significnt Dignostic Studies: CT HED WO CONTRST (<MESUREMENT>)  Result Dte: 11/09/2022 CLINICL DT:  Delirium EXM: CT HED WITHOUT CONTRST TECHNIQUE: Contiguous xil imges were obtined from the bse of the skull through the vertex without intrvenous contrst. RDITION DOSE REDUCTION: This exm ws performed ccording to the deprtmentl dose-optimiztion progrm which includes utomted exposure control, djustment of the m nd/or kV ccording to ptient size nd/or use of itertive reconstruction technique. COMPRISON:  08/09/2015 FINDINGS: Brin: No cute intrcrnil bnormlity. Specificlly, no hemorrhge, hydrocephlus, mss lesion, cute infrction, or  significnt intrcrnil injury. Vsculr: No hyperdense vessel or unexpected clcifiction. Skull: No cute clvril bnormlity. Sinuses/Orbits: No cute findings Other: None IMPRESSION: Norml study. Electroniclly Signed   By: Chrlett Nose M.D.   On: 11/09/2022 21:30   DG Chest Port 1 View  Result Dte: 11/08/2022 CLINICL DT:  Cough, chest pin EXM: PORTBLE CHEST 1 VIEW COMPRISON:  Previous studies including the exmintion of 10/27/2022 FINDINGS: Trnsverse dimeter of hert is incresed. There is poor inspirtion. There is intervl improvement in ertion in both lungs. There re no signs of lveolr pulmonry edem or focl pulmonry consolidtion. There is slight prominence of mrkings in the lower lung fields. There is no pleurl effusion or pneumothorx. IMPRESSION: Subtle increse in mrkings in the lower lung fields my suggest crowding of norml bronchovsculr structures due to poor inspirtion or erly interstitil pneumoni. There is no focl pulmonry consolidtion. There is no pleurl effusion or pneumothorx. Electroniclly Signed   By: Ernie ven M.D.   On: 11/08/2022 16:54   DG [facility] 1 View  Result **Note De-Identified vi Obfusction** Dte: 10/27/2022 CLINICL DT:  cute respirtory filure. EXM: PORTBLE CHEST 1 VIEW COMPRISON:  One view chest x-ry 10/25/2022 FINDINGS: The hert size is norml. The ptient hs been extubted.  smll bore feeding tube courses off the inferior border of the film. Left IJ line is stble. The tip is just pst the cvotril junction. Overll lung volumes hve improved. Interstitil nd ptchy irspce opcities remin. IMPRESSION: 1. Intervl extubtion. 2. Improved lung volumes. 3. Persistent interstitil nd ptchy irspce opcities. Electroniclly Signed   By: Mrin Roberts M.D.   On: 10/27/2022 08:26   DG CHEST PORT 1 VIEW  Result Dte: 10/25/2022 CLINICL DT:  Intubted, respirtor dependent EXM: PORTBLE CHEST 1 VIEW COMPRISON:  Prior chest x-ry  10/24/2022 FINDINGS: The endotrchel tube tip is 4.3 cm bove the crin. Right IJ pproch temporry hemodilysis ctheter remins in unchnged position with the tip t the cvotril junction. Left IJ centrl venous ctheter in stble position with the tip in the upper right trium. n enteric feeding tube is present. The tip lies off the field of view, below the diphrgm nd likely within the stomch or proximl smll bowel. Stble mild crdiomegly. Intervl increse in diffuse interstitil irspce opcity with multifocl ptchy irspce opcity superimposed. Probble smll bilterl pleurl effusions. No pneumothorx. No cute osseous bnormlity. IMPRESSION: 1. Slight intervl increse in pulmonry edem. 2. Slight intervl increse in multifocl ptchy irspce opcities which my reflect symmetric edem or multifocl pneumoni. 3. Stble nd stisfctory position of support pprtus. Electroniclly Signed   By: Mlchy Mon M.D.   On: 10/25/2022 06:53   DG CHEST PORT 1 VIEW  Result Dte: 10/24/2022 CLINICL DT:  Chest pin. EXM: PORTBLE CHEST 1 VIEW COMPRISON:  10/23/2022 FINDINGS: ET tube tip is 3.6 cm bove the crin. Left IJ ctheter is noted with tip in the right trium. Right IJ ctheter tip is in the distl SVC. There is  feeding tube with tip coursing below the GE junction. Stble crdiomedistinl contours. Persistent bilterl pulmonry opcities which re comptible with edem nd or infection. IMPRESSION: 1. Persistent bilterl pulmonry opcities comptible with edem nd or infection. 2. Lines nd tubes s described bove. Electroniclly Signed   By: Sign Kell M.D.   On: 10/24/2022 09:00   DG CHEST PORT 1 VIEW  Result Dte: 10/23/2022 CLINICL DT:  Endotrchel tube present. EXM: PORTBLE CHEST 1 VIEW COMPRISON:  Chest rdiogrph 10/22/2022 FINDINGS: n endotrchel tube remins in plce nd termintes pproximtely 5 cm bove the crin. Bilterl jugulr  venous ctheters re unchnged.  feeding tube termintes in the bdomen ner the ligment of Treitz. The crdic silhouette remins enlrged. Lung volumes remin low with similr ppernce of pulmonry vsculr congestion nd bilterl interstitil nd ptchy irspce opcities which re gretest in the lower lungs. No lrge pleurl effusion or pneumothorx is identified. IMPRESSION: 1. Support devices s bove. 2. Unchnged bilterl lung opcities which my reflect edem or pneumoni. Electroniclly Signed   By: Sebstin che M.D.   On: 10/23/2022 13:06   DG CHEST PORT 1 VIEW  Result Dte: 10/22/2022 CLINICL DT:  Shortness of breth EXM: PORTBLE CHEST 1 VIEW COMPRISON:  Previous studies including the exmintion of 10/18/2022 FINDINGS: Trnsverse dimeter of hert is incresed. Centrl pulmonry vessels re prominent. There re ptchy lveolr densities in both perihilr regions nd both lower lung fields suggesting pulmonry edem or multifocl pneumoni. There is some improvement in ertion in right mid nd right lower lung fields. Lterl CP ngles re cler. There is no pneumothorx.  Tip of the endotracheal tube is 5.6 cm above the carina. Enteric tube is noted traversing the esophagus. Tip of left IJ central venous catheter is seen in right atrium. Tip of right IJ central venous catheter is seen in superior vena cava. IMPRESSION: There is prominence of central pulmonary vessels with alveolar densities in both lungs suggesting pulmonary edema or multifocal pneumonia. There is interval improvement in aeration in right parahilar region and right lower lung field suggesting decreasing atelectasis. Support devices as described above. Electronically Signed   By: Ernie Avena M.D.   On: 10/22/2022 17:18   DG Abd Portable 1V  Result Date: 10/22/2022 CLINICAL DATA:  [id number] Encounter for feeding tube placement [id number] EXAM: PORTABLE ABDOMEN - 1 VIEW. Right flank and abdomen collimated off view.  COMPARISON:  X-ray abdomen 10/21/2022 FINDINGS: Enteric tube with tip overlying the expected region of the proximal jejunum. Second enteric tube with tip in the second portion of the duodenum. Gaseous distension of the small and large bowel. No radio-opaque calculi or other significant radiographic abnormality are seen. IMPRESSION: 1. Enteric tube with tip overlying the expected region of the proximal jejunum. 2. Second enteric tube with tip in the second portion of the duodenum. 3. Gaseous distension of small and large bowel suggestive of ileus. Electronically Signed   By: Tish Frederickson M.D.   On: 10/22/2022 09:32   DG Abd 1 View  Result Date: 10/21/2022 CLINICAL DATA:  Ileus EXAM: ABDOMEN - 1 VIEW COMPARISON:  Abdominal radiographs, most recently October 18, 2022 FINDINGS: NG tube terminates in the stomach. Diffuse gaseous distention of loops of small and large bowel throughout the abdomen, similar in extent compared to October 16, 2022. Partially visualized central venous catheter, terminating in the right atrium. No acute osseous abnormality. IMPRESSION: Diffuse gaseous distention of loops of small and large bowel, not significantly changed compared to priors and suggestive of ileus. Electronically Signed   By: Jacob Moores M.D.   On: 10/21/2022 11:10   DG Abd 1 View  Result Date: 10/18/2022 CLINICAL DATA:  NG tube EXAM: ABDOMEN - 1 VIEW COMPARISON:  10/16/2022, 10/14/2022, 10/13/2022 FINDINGS: Esophageal tube tip and side port are looped within the stomach. Interval moderate gaseous dilatation of the bowel compared to prior, incompletely visualized. IMPRESSION: 1. Esophageal tube tip and side port looped over the stomach. 2. Interval moderate gaseous dilatation of the bowel, incompletely visualized. Findings could be secondary to ileus, suggest dedicated full field abdominal radiographs for further assessment Electronically Signed   By: Jasmine Pang M.D.   On: 10/18/2022 23:09   DG CHEST PORT 1  VIEW  Result Date: 10/18/2022 CLINICAL DATA:  Central line placement EXAM: PORTABLE CHEST 1 VIEW COMPARISON:  10/17/2022 FINDINGS: Endotracheal tube tip 4 cm above the carina. Orogastric or nasogastric tube enters the stomach. Left internal jugular central line tip in the proximal right atrium as seen yesterday. New right internal jugular central line tip probably in the SVC just above the right atrium. External portion of the nasogastric tube overlies this region. No pneumothorax. Slight worsening of bilateral lower lobe volume loss. Possible pulmonary venous hypertension/fluid overload. IMPRESSION: 1. New right internal jugular central line tip probably in the SVC just above the right atrium. Artifact overlies this region. No pneumothorax. 2. Slight worsening of bilateral lower lobe volume loss. Possible pulmonary venous hypertension/fluid overload. Electronically Signed   By: Paulina Fusi M.D.   On: 10/18/2022 13:15   DG CHEST PORT 1 VIEW  Result Date: 10/17/2022 CLINICAL DATA:  Pneumonia EXAM: PORTABLE CHEST 1 VIEW COMPARISON:  09/14/2022 FINDINGS: Enteric tube, endotracheal tube, and left IJ central venous catheter remain appropriately positioned. Normal heart size. Significant improvement of the aeration of both lung fields with mild residual interstitial opacity. No pleural effusion or pneumothorax. IMPRESSION: 1. Significant improvement of the aeration of both lung fields with mild residual interstitial opacity. 2. Stable lines and tubes. Electronically Signed   By: Duanne Guess D.O.   On: 10/17/2022 14:50   DG Abd 1 View  Result Date: 10/16/2022 CLINICAL DATA:  Constipation. EXAM: ABDOMEN - 1 VIEW COMPARISON:  Abdominal x-ray dated October 14, 2022. FINDINGS: Unchanged enteric tube in the stomach.  Normal bowel gas pattern. IMPRESSION: 1. No acute findings. Electronically Signed   By: Obie Dredge M.D.   On: 10/16/2022 12:53   DG CHEST PORT 1 VIEW  Result Date: 10/14/2022 CLINICAL DATA:   47 year old female central line placement. EXAM: PORTABLE CHEST 1 VIEW COMPARISON:  Portable chest 0808 hours today. FINDINGS: Portable AP supine view at 1143 hours. Left IJ approach central line has been placed, tip is at the cavoatrial junction level. Endotracheal tube tip remains in good position between the level the clavicles and carina. Enteric tube courses to the abdomen and the tip is not included. Stable lung volumes. Mediastinal contours remain normal. Coarse and confluent bilateral pulmonary opacity has not significantly changed. No pneumothorax or pleural effusion identified. Stable visualized osseous structures. IMPRESSION: 1. Left IJ approach central line tip at the cavoatrial junction, no adverse features. 2.  Otherwise stable lines and tubes. 3. Stable pulmonary ventilation, bilateral lung opacity since 0808 hours. Electronically Signed   By: Odessa Fleming M.D.   On: 10/14/2022 12:29    Microbiology: No results found for this or any previous visit (from the past 240 hour(s)).   Labs: Basic Metabolic Panel: Recent Labs  Lab 11/09/22 0428 11/10/22 0139 11/11/22 0151 11/12/22 0814 11/13/22 0108  NA 136 132* 134* 136 135  K 4.1 3.6 3.6 3.9 4.0  CL 106 104 105 108 107  CO2 17* 17* 17* 17* 15*  GLUCOSE 135* 197* 153* 148* 195*  BUN 11 11 10 12 12   CREATININE 1.00 1.20* 1.36* 1.38* 1.24*  CALCIUM 9.0 8.8* 9.0 9.0 9.1  MG 1.2* 2.0 1.6*  --  1.6*  PHOS  --   --  4.5  --  4.7*   Liver Function Tests: Recent Labs  Lab 11/10/22 0139 11/13/22 0108  AST 10* 10*  ALT 17 16  ALKPHOS 78 92  BILITOT 0.5 0.4  PROT 6.6 6.9  ALBUMIN 2.8* 3.0*   No results for input(s): "LIPASE", "AMYLASE" in the last 168 hours. Recent Labs  Lab 11/11/22 0153  AMMONIA 22   CBC: Recent Labs  Lab 11/09/22 0428 11/10/22 0139 11/11/22 0151 11/13/22 0108  WBC 5.8 5.9 5.4 6.1  NEUTROABS  --   --   --  3.8  HGB 10.1* 9.9* 10.4* 10.7*  HCT 32.9* 32.8* 33.9* 35.0*  MCV 83.7 81.6 81.1 81.0  PLT 382  362 396 338   Cardiac Enzymes: No results for input(s): "CKTOTAL", "CKMB", "CKMBINDEX", "TROPONINI" in the last 168 hours. BNP: BNP (last 3 results) Recent Labs    10/11/22 1839 10/24/22 0403  BNP 1,202.6* 737.0*    ProBNP (last 3 results) No results for input(s): "PROBNP" in the last 8760 hours.  CBG: Recent Labs  Lab 11/12/22 1203 11/12/22 1321 11/12/22 1637 11/12/22 2202 11/13/22 0728  GLUCAP 61* 96 133* 190* 182*  Signed:  Lacretia Nicks MD.  [facility] 11/13/2022, 11:18 AM

## 2022-11-13 NOTE — Progress Notes (Addendum)
DISCHARGE NOTE HOME Linda Ellison to be discharged Home per MD order. Discussed prescriptions and follow up appointments with the patient. Prescriptions given to patient; medication list explained in detail. Patient verbalized understanding.  Skin clean, dry and intact without evidence of skin break down, no evidence of skin tears noted. IV catheter discontinued intact. Site without signs and symptoms of complications. Dressing and pressure applied. Pt denies pain at the site currently. No complaints noted.  Patient free of lines, drains, and wounds.   An After Visit Summary (AVS) was printed and given to the patient. Patient escorted via wheelchair, and discharged home via private auto.  Velia Meyer, RN

## 2022-11-13 NOTE — TOC Transition Note (Addendum)
Transition of Care [facility]) - CM/SW Discharge Note   Patient Details  Name: Linda Ellison MRN: [id number] Date of Birth: 05-Jun-1975  Transition of Care [facility]) CM/SW Contact:  Ronny Bacon, RN Phone Number: 11/13/2022, 11:42 AM   Clinical Narrative: Patient is being discharged home today. Delila Spence, confirmed that patient has [facility] services through them.   1627: Spoke with Mother in law [phone]), confirmed that DME equipment is at the house and the technician is in the process of putting it all together.  Floor nurse made aware.    Final next level of care: Home w Home Health Services     Patient Goals and CMS Choice      Discharge Placement                         Discharge Plan and Services Additional resources added to the After Visit Summary for                                       Social Determinants of Health (SDOH) Interventions SDOH Screenings   Food Insecurity: No Food Insecurity (11/07/2022)  Housing: Medium Risk (11/07/2022)  Transportation Needs: No Transportation Needs (11/07/2022)  Utilities: Not At Risk (11/07/2022)  Alcohol Screen: Low Risk  (11/21/2017)  Financial Resource Strain: Low Risk  (05/13/2021)   [facility] from [facility], [facility] visits prior to 06/05/2022., [facility] visits prior to 06/05/2022.  Physical Activity: Unknown (05/13/2021)   [facility], [facility] visits prior to 06/05/2022., [facility] visits prior to 06/05/2022.  Social Connections: Unknown (08/16/2021)   Received from [facility], [facility]  Stress: No Stress Concern Present (05/13/2021)   [facility], [facility] visits prior to 06/05/2022., [facility] visits prior to 06/05/2022.  Tobacco Use: Low Risk  (10/10/2022)     Readmission Risk  Interventions    10/11/2022    3:46 PM  Readmission Risk Prevention Plan  Transportation Screening Complete  Medication Review (RN Care Manager) Referral to Pharmacy  PCP or Specialist appointment within 3-5 days of discharge Complete  HRI or Home Care Consult Complete  SW Recovery Care/Counseling Consult Complete  Palliative Care Screening Not Applicable  Skilled Nursing Facility Not Applicable

## 2022-11-17 ENCOUNTER — Other Ambulatory Visit (HOSPITAL_COMMUNITY): Payer: Self-pay

## 2022-11-22 ENCOUNTER — Inpatient Hospital Stay (HOSPITAL_COMMUNITY)
Admission: EM | Admit: 2022-11-22 | Discharge: 2022-11-26 | DRG: 305 | Disposition: A | Discharge status: Home Health | Payer: Medicare (Managed Care) | Attending: Internal Medicine | Admitting: Internal Medicine

## 2022-11-22 ENCOUNTER — Emergency Department (HOSPITAL_COMMUNITY): Payer: Medicare (Managed Care)

## 2022-11-22 DIAGNOSIS — Z841 Family history of disorders of kidney and ureter: Secondary | ICD-10-CM

## 2022-11-22 DIAGNOSIS — Z833 Family history of diabetes mellitus: Secondary | ICD-10-CM

## 2022-11-22 DIAGNOSIS — E872 Acidosis, unspecified: Secondary | ICD-10-CM | POA: Diagnosis present

## 2022-11-22 DIAGNOSIS — E785 Hyperlipidemia, unspecified: Secondary | ICD-10-CM | POA: Diagnosis present

## 2022-11-22 DIAGNOSIS — E876 Hypokalemia: Secondary | ICD-10-CM | POA: Diagnosis present

## 2022-11-22 DIAGNOSIS — E1165 Type 2 diabetes mellitus with hyperglycemia: Secondary | ICD-10-CM | POA: Diagnosis present

## 2022-11-22 DIAGNOSIS — E1143 Type 2 diabetes mellitus with diabetic autonomic (poly)neuropathy: Secondary | ICD-10-CM | POA: Diagnosis present

## 2022-11-22 DIAGNOSIS — Z7985 Long-term (current) use of injectable non-insulin antidiabetic drugs: Secondary | ICD-10-CM

## 2022-11-22 DIAGNOSIS — E669 Obesity, unspecified: Secondary | ICD-10-CM | POA: Diagnosis present

## 2022-11-22 DIAGNOSIS — I2510 Atherosclerotic heart disease of native coronary artery without angina pectoris: Secondary | ICD-10-CM | POA: Diagnosis present

## 2022-11-22 DIAGNOSIS — Z79899 Other long term (current) drug therapy: Secondary | ICD-10-CM | POA: Diagnosis not present

## 2022-11-22 DIAGNOSIS — D84821 Immunodeficiency due to drugs: Secondary | ICD-10-CM | POA: Diagnosis present

## 2022-11-22 DIAGNOSIS — Z940 Kidney transplant status: Secondary | ICD-10-CM

## 2022-11-22 DIAGNOSIS — Z91048 Other nonmedicinal substance allergy status: Secondary | ICD-10-CM

## 2022-11-22 DIAGNOSIS — Z6833 Body mass index (BMI) 33.0-33.9, adult: Secondary | ICD-10-CM

## 2022-11-22 DIAGNOSIS — Z91013 Allergy to seafood: Secondary | ICD-10-CM

## 2022-11-22 DIAGNOSIS — Z885 Allergy status to narcotic agent status: Secondary | ICD-10-CM

## 2022-11-22 DIAGNOSIS — K3184 Gastroparesis: Secondary | ICD-10-CM | POA: Diagnosis present

## 2022-11-22 DIAGNOSIS — Z794 Long term (current) use of insulin: Secondary | ICD-10-CM

## 2022-11-22 DIAGNOSIS — Z888 Allergy status to other drugs, medicaments and biological substances status: Secondary | ICD-10-CM | POA: Diagnosis not present

## 2022-11-22 DIAGNOSIS — D631 Anemia in chronic kidney disease: Secondary | ICD-10-CM | POA: Diagnosis present

## 2022-11-22 DIAGNOSIS — J45909 Unspecified asthma, uncomplicated: Secondary | ICD-10-CM | POA: Diagnosis present

## 2022-11-22 DIAGNOSIS — Z9102 Food additives allergy status: Secondary | ICD-10-CM | POA: Diagnosis not present

## 2022-11-22 DIAGNOSIS — Z1152 Encounter for screening for COVID-19: Secondary | ICD-10-CM | POA: Diagnosis not present

## 2022-11-22 DIAGNOSIS — I951 Orthostatic hypotension: Secondary | ICD-10-CM | POA: Diagnosis present

## 2022-11-22 DIAGNOSIS — Z881 Allergy status to other antibiotic agents status: Secondary | ICD-10-CM | POA: Diagnosis not present

## 2022-11-22 DIAGNOSIS — Z8261 Family history of arthritis: Secondary | ICD-10-CM

## 2022-11-22 DIAGNOSIS — I158 Other secondary hypertension: Secondary | ICD-10-CM | POA: Diagnosis present

## 2022-11-22 DIAGNOSIS — Z91014 Allergy to mammalian meats: Secondary | ICD-10-CM

## 2022-11-22 DIAGNOSIS — I161 Hypertensive emergency: Secondary | ICD-10-CM | POA: Diagnosis present

## 2022-11-22 DIAGNOSIS — I160 Hypertensive urgency: Secondary | ICD-10-CM | POA: Diagnosis present

## 2022-11-22 DIAGNOSIS — Z8249 Family history of ischemic heart disease and other diseases of the circulatory system: Secondary | ICD-10-CM

## 2022-11-22 DIAGNOSIS — Z79621 Long term (current) use of calcineurin inhibitor: Secondary | ICD-10-CM

## 2022-11-22 DIAGNOSIS — R55 Syncope and collapse: Secondary | ICD-10-CM | POA: Diagnosis not present

## 2022-11-22 LAB — BASIC METABOLIC PANEL
Anion gap: 12 (ref 5–15)
BUN: 8 mg/dL (ref 6–20)
CO2: 18 mmol/L — ABNORMAL LOW (ref 22–32)
Calcium: 8.6 mg/dL — ABNORMAL LOW (ref 8.9–10.3)
Chloride: 106 mmol/L (ref 98–111)
Creatinine, Ser: 0.67 mg/dL (ref 0.44–1.00)
GFR, Estimated: >60 mL/min (ref >60)
Glucose, Bld: 275 mg/dL — ABNORMAL HIGH (ref 70–99)
Potassium: 3.6 mmol/L (ref 3.5–5.1)
Sodium: 136 mmol/L (ref 135–145)

## 2022-11-22 LAB — HEPATIC FUNCTION PANEL
ALT: 18 U/L (ref 0–44)
AST: 11 U/L — ABNORMAL LOW (ref 15–41)
Albumin: 3.1 g/dL — ABNORMAL LOW (ref 3.5–5.0)
Alkaline Phosphatase: 77 U/L (ref 38–126)
Bilirubin, Direct: <0.1 mg/dL (ref 0.0–0.2)
Total Bilirubin: 0.5 mg/dL (ref 0.3–1.2)
Total Protein: 6.9 g/dL (ref 6.5–8.1)

## 2022-11-22 LAB — CBC WITH DIFFERENTIAL/PLATELET
Abs Immature Granulocytes: 0.06 10*3/uL (ref 0.00–0.07)
Basophils Absolute: 0.1 10*3/uL (ref 0.0–0.1)
Basophils Relative: 1 %
Eosinophils Absolute: 0 10*3/uL (ref 0.0–0.5)
Eosinophils Relative: 0 %
HCT: 40.4 % (ref 36.0–46.0)
Hemoglobin: 11.6 g/dL — ABNORMAL LOW (ref 12.0–15.0)
Immature Granulocytes: 1 %
Lymphocytes Relative: 11 %
Lymphs Abs: 0.8 10*3/uL (ref 0.7–4.0)
MCH: 24.9 pg — ABNORMAL LOW (ref 26.0–34.0)
MCHC: 28.7 g/dL — ABNORMAL LOW (ref 30.0–36.0)
MCV: 86.7 fL (ref 80.0–100.0)
Monocytes Absolute: 0.5 10*3/uL (ref 0.1–1.0)
Monocytes Relative: 6 %
Neutro Abs: 6.5 10*3/uL (ref 1.7–7.7)
Neutrophils Relative %: 81 %
Platelets: 193 10*3/uL (ref 150–400)
RBC: 4.66 MIL/uL (ref 3.87–5.11)
RDW: 16.2 % — ABNORMAL HIGH (ref 11.5–15.5)
WBC: 7.9 10*3/uL (ref 4.0–10.5)
nRBC: 0 % (ref 0.0–0.2)

## 2022-11-22 LAB — RESP PANEL BY RT-PCR (RSV, FLU A&B, COVID)  RVPGX2
Influenza A by PCR: NEGATIVE
Influenza B by PCR: NEGATIVE
Resp Syncytial Virus by PCR: NEGATIVE
SARS Coronavirus 2 by RT PCR: NEGATIVE

## 2022-11-22 LAB — AMMONIA: Ammonia: 17 umol/L (ref 9–35)

## 2022-11-22 LAB — CBG MONITORING, ED: Glucose-Capillary: 329 mg/dL — ABNORMAL HIGH (ref 70–99)

## 2022-11-22 MED ORDER — INSULIN GLARGINE-YFGN 100 UNIT/ML ~~LOC~~ SOLN
7.0000 [IU] | Freq: Two times a day (BID) | SUBCUTANEOUS | Status: DC
  Filled 2022-11-22: qty 0.07

## 2022-11-22 MED ORDER — SODIUM CHLORIDE 0.9 % IV BOLUS: 1000.0000 mL | Freq: Once | INTRAVENOUS | Status: DC

## 2022-11-22 MED ORDER — ATORVASTATIN CALCIUM 10 MG PO TABS
10.0000 mg | ORAL_TABLET | Freq: Every day | ORAL | Status: DC
  Administered 2022-11-23 – 2022-11-26 (×4): 10 mg via ORAL
  Filled 2022-11-22 (×4): qty 1

## 2022-11-22 MED ORDER — PANTOPRAZOLE SODIUM 40 MG PO TBEC
40.0000 mg | DELAYED_RELEASE_TABLET | Freq: Every day | ORAL | Status: DC
  Administered 2022-11-23 – 2022-11-26 (×4): 40 mg via ORAL
  Filled 2022-11-22 (×4): qty 1

## 2022-11-22 MED ORDER — TACROLIMUS 1 MG PO CAPS
4.0000 mg | ORAL_CAPSULE | Freq: Two times a day (BID) | ORAL | Status: DC
  Administered 2022-11-23 – 2022-11-26 (×8): 4 mg via ORAL
  Filled 2022-11-22 (×8): qty 4

## 2022-11-22 MED ORDER — LABETALOL HCL 200 MG PO TABS
300.0000 mg | ORAL_TABLET | Freq: Two times a day (BID) | ORAL | Status: DC
  Administered 2022-11-23 – 2022-11-26 (×8): 300 mg via ORAL
  Filled 2022-11-22 (×4): qty 2
  Filled 2022-11-22: qty 3
  Filled 2022-11-22 (×5): qty 2

## 2022-11-22 MED ORDER — LABETALOL HCL 5 MG/ML IV SOLN
10.0000 mg | Freq: Once | INTRAVENOUS | Status: AC
  Administered 2022-11-22: 10 mg via INTRAVENOUS
  Filled 2022-11-22: qty 4

## 2022-11-22 MED ORDER — INSULIN ASPART 100 UNIT/ML IJ SOLN
0.0000 [IU] | Freq: Every day | INTRAMUSCULAR | Status: DC
  Administered 2022-11-23: 2 [IU] via SUBCUTANEOUS
  Administered 2022-11-23: 3 [IU] via SUBCUTANEOUS
  Filled 2022-11-22: qty 0.05

## 2022-11-22 MED ORDER — FOLIC ACID 1 MG PO TABS
1.0000 mg | ORAL_TABLET | Freq: Every day | ORAL | Status: DC
  Administered 2022-11-23 – 2022-11-26 (×4): 1 mg via ORAL
  Filled 2022-11-22 (×4): qty 1

## 2022-11-22 MED ORDER — SPIRONOLACTONE 25 MG PO TABS
50.0000 mg | ORAL_TABLET | Freq: Every day | ORAL | Status: DC
  Administered 2022-11-23: 50 mg via ORAL
  Filled 2022-11-22: qty 2

## 2022-11-22 MED ORDER — DOCUSATE SODIUM 100 MG PO CAPS: 100.0000 mg | ORAL_CAPSULE | Freq: Two times a day (BID) | ORAL | Status: DC | PRN

## 2022-11-22 MED ORDER — ENOXAPARIN SODIUM 40 MG/0.4ML IJ SOSY
40.0000 mg | PREFILLED_SYRINGE | INTRAMUSCULAR | Status: DC
  Administered 2022-11-23 – 2022-11-26 (×4): 40 mg via SUBCUTANEOUS
  Filled 2022-11-22 (×3): qty 0.4

## 2022-11-22 MED ORDER — CLEVIDIPINE BUTYRATE 0.5 MG/ML IV EMUL
0.0000 mg/h | INTRAVENOUS | Status: DC
  Administered 2022-11-22: 2 mg/h via INTRAVENOUS
  Filled 2022-11-22 (×2): qty 50

## 2022-11-22 MED ORDER — GABAPENTIN 100 MG PO CAPS
100.0000 mg | ORAL_CAPSULE | Freq: Every day | ORAL | Status: DC
  Administered 2022-11-23 – 2022-11-25 (×4): 100 mg via ORAL
  Filled 2022-11-22 (×5): qty 1

## 2022-11-22 MED ORDER — SODIUM CHLORIDE 0.9 % IV BOLUS
500.0000 mL | Freq: Once | INTRAVENOUS | Status: AC
  Administered 2022-11-22: 500 mL via INTRAVENOUS

## 2022-11-22 MED ORDER — PREDNISONE 5 MG PO TABS
5.0000 mg | ORAL_TABLET | Freq: Every day | ORAL | Status: DC
  Administered 2022-11-23 – 2022-11-26 (×4): 5 mg via ORAL
  Filled 2022-11-22 (×4): qty 1

## 2022-11-22 MED ORDER — AMLODIPINE BESYLATE 10 MG PO TABS
10.0000 mg | ORAL_TABLET | Freq: Every day | ORAL | Status: DC
  Administered 2022-11-23 – 2022-11-25 (×3): 10 mg via ORAL
  Filled 2022-11-22 (×3): qty 1

## 2022-11-22 MED ORDER — INSULIN ASPART 100 UNIT/ML IJ SOLN
0.0000 [IU] | Freq: Three times a day (TID) | INTRAMUSCULAR | Status: DC
  Administered 2022-11-23: 3 [IU] via SUBCUTANEOUS
  Administered 2022-11-23 (×2): 5 [IU] via SUBCUTANEOUS
  Administered 2022-11-24: 3 [IU] via SUBCUTANEOUS
  Administered 2022-11-24 (×2): 8 [IU] via SUBCUTANEOUS
  Administered 2022-11-25: 5 [IU] via SUBCUTANEOUS
  Administered 2022-11-25: 2 [IU] via SUBCUTANEOUS
  Administered 2022-11-25: 5 [IU] via SUBCUTANEOUS
  Administered 2022-11-26: 2 [IU] via SUBCUTANEOUS
  Administered 2022-11-26: 3 [IU] via SUBCUTANEOUS
  Filled 2022-11-22: qty 0.15

## 2022-11-22 MED ORDER — MYCOPHENOLATE SODIUM 180 MG PO TBEC
720.0000 mg | DELAYED_RELEASE_TABLET | Freq: Two times a day (BID) | ORAL | Status: DC
  Administered 2022-11-23 – 2022-11-26 (×8): 720 mg via ORAL
  Filled 2022-11-22 (×8): qty 4

## 2022-11-22 MED ORDER — LOSARTAN POTASSIUM 50 MG PO TABS
100.0000 mg | ORAL_TABLET | Freq: Every day | ORAL | Status: DC
  Administered 2022-11-23 – 2022-11-26 (×4): 100 mg via ORAL
  Filled 2022-11-22 (×4): qty 2

## 2022-11-22 MED ORDER — ACETAMINOPHEN 325 MG PO TABS
650.0000 mg | ORAL_TABLET | ORAL | Status: DC | PRN
  Administered 2022-11-24 (×2): 650 mg via ORAL
  Filled 2022-11-22 (×2): qty 2

## 2022-11-22 MED ORDER — SULFAMETHOXAZOLE-TRIMETHOPRIM 400-80 MG PO TABS
1.0000 | ORAL_TABLET | ORAL | Status: DC
  Administered 2022-11-24 – 2022-11-26 (×2): 1 via ORAL
  Filled 2022-11-22 (×2): qty 1

## 2022-11-22 MED ORDER — MONTELUKAST SODIUM 10 MG PO TABS
10.0000 mg | ORAL_TABLET | Freq: Every day | ORAL | Status: DC
  Administered 2022-11-23 – 2022-11-25 (×4): 10 mg via ORAL
  Filled 2022-11-22 (×4): qty 1

## 2022-11-22 MED ORDER — BUPROPION HCL 75 MG PO TABS
225.0000 mg | ORAL_TABLET | Freq: Two times a day (BID) | ORAL | Status: DC
  Administered 2022-11-23 – 2022-11-26 (×8): 225 mg via ORAL
  Filled 2022-11-22 (×8): qty 3

## 2022-11-22 MED ORDER — POLYETHYLENE GLYCOL 3350 17 G PO PACK: 17.0000 g | PACK | Freq: Every day | ORAL | Status: DC | PRN

## 2022-11-22 NOTE — ED Triage Notes (Signed)
Pt BIBA with c/o syncopal episode but did not hit her head, pt was guided down onto the floor. Unknown cause of episode.   218/130 CBG 325

## 2022-11-22 NOTE — H&P (Signed)
NAME:  AVREY THRALL, MRN:  [id number], DOB:  July 11, 1975, LOS: 0 ADMISSION DATE:  11/22/2022, CONSULTATION DATE:  11/22/22  REFERRING MD:  ED, CHIEF COMPLAINT:  Headache   History of Present Illness:  47 year old woman history of ESRD now status post kidney transplant presents with headache and elevated blood pressure concerning for hypertensive emergency.  Notably, had a recent admission for over a month with pneumonia requiring intubation and acute renal failure requiring dialysis now recovered.  At home have presyncope or syncope.  Some headache.  Deny significant trauma or fall but says she was walking at the time.  Was brought to the ED.  Noted to have elevated blood pressures 1 80-1 90 systolic.  CT head negative.  Labs relatively reassuring, normal renal function etc.  Given labetalol IV with minimal effect.  Started on clevidipine.  Pertinent  Medical History  As discussed above.  Significant Hospital Events: Including procedures, antibiotic start and stop dates in addition to other pertinent events   8/19 admitted to the hospital with elevated blood pressure headache syncope/presyncope  Interim History / Subjective:    Objective   Blood pressure (!) 188/115, pulse (!) 102, temperature 97.8 F (36.6 C), temperature source Oral, resp. rate (!) 28, height 5\' 6"  (1.676 m), weight 109.8 kg, SpO2 100%.       No intake or output data in the 24 hours ending 11/22/22 2342 Filed Weights   11/22/22 1739  Weight: 109.8 kg    Examination: General: Lying in gurney, no acute distress HENT: EOMI, no icterus, moist mucous membranes Lungs: Normal work of breathing, on room air Cardiovascular: Regular rate and rhythm, warm Abdomen: Nondistended, bowel sounds present MSK: No synovitis, no joint effusion Neuro: No focal deficits, cranial nerves intact   Resolved Hospital Problem list     Assessment & Plan:  Hypertensive emergency: Headache, possible syncope. -- Cleviprex SBP goal  less than 150 -- Resume oral medications, add amlodipine 10 mg in the morning (was on clonidine prior to discharge earlier month 11/2022, stopped due to orthostatic hypotension)  Syncope: She reports passing out.  Lightheadedness.  Denies vertigo.  Possible orthostatic hypotension, orthostasis although she denies immediately rising prior to event. -- Telemetry, recent TTE defer repeating at this time  Headache: Poss related to hypertension.  No concerning findings for press etc.  CT head clear. -- BP control as above  Renal transplant: -- Continue prednisone 5 mg daily, mycophenolate 720 twice daily, tacrolimus 4 mg twice daily --Continue Bactrim for PJP prophylaxis -- Nephrology consult in the morning to follow along with renal transplant  Diabetes: -- Continue home long-acting 7 units twice daily, sliding scale insulin  Best Practice (right click and "Reselect all SmartList Selections" daily)   Diet/type: Regular consistency (see orders) DVT prophylaxis: LMWH GI prophylaxis: N/A Lines: N/A Foley:  N/A Code Status:  full code Last date of multidisciplinary goals of care discussion [patient and family updated at bedside]  Labs   CBC: Recent Labs  Lab 11/22/22 2126  WBC 7.9  NEUTROABS 6.5  HGB 11.6*  HCT 40.4  MCV 86.7  PLT 193    Basic Metabolic Panel: Recent Labs  Lab 11/22/22 2304  NA 136  K 3.6  CL 106  CO2 18*  GLUCOSE 275*  BUN 8  CREATININE 0.67  CALCIUM 8.6*   GFR: Estimated Creatinine Clearance: 110.3 mL/min (by C-G formula based on SCr of 0.67 mg/dL). Recent Labs  Lab 11/22/22 2126  WBC 7.9    Liver  Function Tests: Recent Labs  Lab 11/22/22 2304  AST 11*  ALT 18  ALKPHOS 77  BILITOT 0.5  PROT 6.9  ALBUMIN 3.1*   No results for input(s): "LIPASE", "AMYLASE" in the last 168 hours. Recent Labs  Lab 11/22/22 2304  AMMONIA 17    ABG    Component Value Date/Time   PHART 7.477 (H) 10/23/2022 0602   PCO2ART 34.5 10/23/2022 0602    PO2ART 67 (L) 10/23/2022 0602   HCO3 25.5 10/23/2022 0602   TCO2 27 10/23/2022 0602   ACIDBASEDEF 2.0 10/17/2022 0540   O2SAT 94 10/23/2022 0602     Coagulation Profile: No results for input(s): "INR", "PROTIME" in the last 168 hours.  Cardiac Enzymes: No results for input(s): "CKTOTAL", "CKMB", "CKMBINDEX", "TROPONINI" in the last 168 hours.  HbA1C: Hgb A1c MFr Bld  Date/Time Value Ref Range Status  08/27/2022 03:43 AM 6.7 (H) 4.8 - 5.6 % Final    Comment:    (NOTE)         Prediabetes: 5.7 - 6.4         Diabetes: >6.4         Glycemic control for adults with diabetes: <7.0   11/21/2017 06:49 PM 9.9 (H) 4.8 - 5.6 % Final    Comment:    (NOTE) Pre diabetes:          5.7%-6.4% Diabetes:              >6.4% Glycemic control for   <7.0% adults with diabetes     CBG: Recent Labs  Lab 11/22/22 1752  GLUCAP 329*    Review of Systems:   No chest pain.  No orthopnea or PND.  Comprehensive review of systems otherwise negative.  Past Medical History:  She,  has a past medical history of Allergy, Anemia associated with chronic renal failure, Anxiety, Asthma, Atypical chest pain, AV (arteriovenous fistula) (HCC), CAD (coronary artery disease) (cardiologist-- dr Sharol Roussel [facility] in [place])), Depression, Elevated lipids, ESRD on hemodialysis (HCC) (NEPHROLOGIST-  DR MATTINGLY), Gastroparesis, GERD (gastroesophageal reflux disease), History of pneumonia, Hyperlipidemia, Hyperthyroidism, Menorrhagia, Other secondary hypertension, Peripheral neuropathy, PONV (postoperative nausea and vomiting), Pre-transplant evaluation for kidney transplant, Renal failure syndrome (03/05/2022), Secondary hyperparathyroidism of renal origin [facility]), Sleep apnea, and Type 2 diabetes mellitus, with long-term current use of insulin (HCC).   Surgical History:   Past Surgical History:  Procedure Laterality Date   AV FISTULA PLACEMENT  08/2010   Left radiocephalic AVF   AV FISTULA  PLACEMENT Right 05/07/2014   Procedure: ARTERIOVENOUS (AV) FISTULA CREATION;  Surgeon: Sherren Kerns, MD;  [facility];  Service: Vascular;  Laterality: Right;   AV FISTULA PLACEMENT Right 07/02/2014   Procedure: INSERTION OF ARTERIOVENOUS (AV) GORE-TEX GRAFT ARM;  Surgeon: Sherren Kerns, MD;  Location: [facility] OR;  Service: Vascular;  Laterality: Right;   CARDIOVASCULAR STRESS TEST  06/25/2016   [facility]   normal nuclear perfusion study w/ no ischemia/  normal LV function and wall motion , ef 51%   CESAREAN SECTION  03/22/2001   w/ Bilateral Tubal Ligation   COLONOSCOPY     COLONOSCOPY WITH ESOPHAGOGASTRODUODENOSCOPY (EGD)  10/19/2011   DILATION AND CURETTAGE OF UTERUS  05/12/2000   w/ suction for missed ab   DOBUTAMINE STRESS ECHO  06/04/2016    [facility]   normal stress echo w/ no chest pain or ischemia/  normal LV function and wall motion , stress ef 60-65%   ESOPHAGOGASTRODUODENOSCOPY  10/19/2011   Dr.  Stan Head   FEMUR IM NAIL Left child   removed   FOOT SURGERY Left 2014 approx.   HYSTEROSCOPY WITH NOVASURE N/A 08/19/2016   Procedure: HYSTEROSCOPY WITH NOVASURE;  Surgeon: Huel Cote, MD;  Location: [facility];  Service: Gynecology;  Laterality: N/A;   KIDNEY TRANSPLANT  09/18/2016   LAPAROSCOPIC CHOLECYSTECTOMY  1994   LEFT HEART CATHETERIZATION WITH CORONARY ANGIOGRAM N/A 06/27/2012   Procedure: LEFT HEART CATHETERIZATION WITH CORONARY ANGIOGRAM;  Surgeon: Laurey Morale, MD;  Location: [facility] CATH LAB;  Service: Cardiovascular;  Laterality: N/A; No ostructive CAD, normal LVF, ef 55-60% (mild LAD luminal irregularities, moderate dRCA diffuse disease)   LIGATION GORETEX FISTULA  01/04/11   Left AVF   LIGATION OF ARTERIOVENOUS  FISTULA Left 08/21/2014   Procedure: LIGATION OF ARTERIOVENOUS  FISTULA;  Surgeon: Annice Needy, MD;  Location: [facility] ORS;  Service: Vascular;  Laterality: Left;   LIGATION OF ARTERIOVENOUS  FISTULA Left 06/29/2017   Procedure: LIGATION OF  ARTERIOVENOUS  FISTULA ( RADIOCEPHALIC );  Surgeon: Annice Needy, MD;  Location: [facility] ORS;  Service: Vascular;  Laterality: Left;   NEPHRECTOMY TRANSPLANTED ORGAN     PERIPHERAL VASCULAR CATHETERIZATION Left 08/08/2014   Procedure: Upper Extremity Angiography;  Surgeon: Annice Needy, MD;  Location: [facility] INVASIVE CV LAB;  Service: Cardiovascular;  Laterality: Left;   PERIPHERAL VASCULAR CATHETERIZATION Left 08/08/2014   Procedure: Upper Extremity Intervention;  Surgeon: Annice Needy, MD;  Location: [facility] INVASIVE CV LAB;  Service: Cardiovascular;  Laterality: Left;   PERIPHERAL VASCULAR CATHETERIZATION Left 03/08/2016   Procedure: A/V Fistulagram;  Surgeon: Annice Needy, MD;  Location: [facility] INVASIVE CV LAB;  Service: Cardiovascular;  Laterality: Left;   POLYPECTOMY     THROMBECTOMY AND REVISION OF ARTERIOVENTOUS (AV) GORETEX  GRAFT Right 07/16/2014   Procedure: THROMBECTOMY  Right  arm  ARTERIOVENOUS  GORETEX  GRAFT;  Surgeon: Sherren Kerns, MD;  Location: [facility] OR;  Service: Vascular;  Laterality: Right;   TRANSTHORACIC ECHOCARDIOGRAM  06/04/2016   mild concentric LVH, ef 55-60%,  mild TR,  borderline LAE,  trivial MR and PR     Social History:   reports that she has never smoked. She has never used smokeless tobacco. She reports that she does not drink alcohol and does not use drugs.   Family History:  Her family history includes Arthritis in her brother; Diabetes in her father, maternal aunt, and paternal aunt; Heart disease in her brother, mother, and paternal aunt; Hypertension in her father, maternal aunt, and paternal aunt; Kidney disease in her maternal grandmother and mother; Kidney failure in her paternal aunt and paternal uncle. There is no history of Colon cancer, Colon polyps, Crohn's disease, Esophageal cancer, Rectal cancer, Stomach cancer, or Ulcerative colitis.   Allergies Allergies  Allergen Reactions   Ciprofloxacin Hives, Itching and Nausea And Vomiting   Fluocinolone Itching,  Nausea And Vomiting and Other (See Comments)   Hydromorphone Hives   Strawberry Extract Itching and Other (See Comments)    Pt states that this medication makes her tongue raw.   No reaction   Hydromorphone Hcl Hives   Phenytoin Sodium Extended Itching   Adhesive [Tape] Itching and Rash   Chlorhexidine Gluconate Itching   Clindamycin Diarrhea, Nausea And Vomiting and Rash   Shrimp [Shellfish Allergy] Rash     Home Medications  Prior to Admission medications   Medication Sig Start Date End Date Taking? Authorizing Provider  acetaminophen (TYLENOL) 325 MG tablet Take 2 tablets (  650 mg total) by mouth every 6 (six) hours as needed for mild pain, fever or headache. 11/13/22   Zigmund Daniel., MD  albuterol (PROVENTIL HFA;VENTOLIN HFA) 108 (90 Base) MCG/ACT inhaler Inhale 2 puffs into the lungs every 6 (six) hours as needed for wheezing or shortness of breath. 11/25/17   Armandina Stammer I, NP  atorvastatin (LIPITOR) 10 MG tablet Take 1 tablet (10 mg total) by mouth daily. 11/13/22 12/13/22  Zigmund Daniel., MD  Blood Glucose Monitoring Suppl (ONETOUCH VERIO FLEX SYSTEM) w/Device KIT Use 3 (three) times daily. 11/13/22   Zigmund Daniel., MD  buPROPion (WELLBUTRIN) 75 MG tablet Take 3 tablets (225 mg total) by mouth 2 (two) times daily. 11/13/22 01/12/23  Zigmund Daniel., MD  cetirizine (ZYRTEC) 10 MG tablet Take 1 tablet (10 mg total) by mouth daily. 11/13/22   Zigmund Daniel., MD  Dulaglutide 4.5 MG/0.5ML SOPN Inject 4.5 mg into the skin once a week. Every Tuesday 12/09/21   [provider]  folic acid (FOLVITE) 1 MG tablet Take 1 tablet (1 mg total) by mouth daily. 11/13/22 01/12/23  Zigmund Daniel., MD  gabapentin (NEURONTIN) 100 MG capsule Take 1 capsule (100 mg total) by mouth at bedtime. 11/13/22 01/12/23  Zigmund Daniel., MD  Glucose Blood (BLOOD GLUCOSE TEST STRIPS) STRP Use as directed to check blood sugar 3 (three) times daily. 11/13/22   Zigmund Daniel., MD  insulin aspart (NOVOLOG) 100 UNIT/ML FlexPen Inject 0-9 Units into the skin 3 (three) times daily with meals. CBG < 70: treat for low blood sugar CBG 121 - 150: 0 units           CBG 151 - 200: 1 unit CBG 201 - 250: 2 units           CBG 251 - 300: 3 units  CBG 301 - 350: 5 units           CBG 351 - 400: 7 units CBG > 400: 9 units and call MD 11/13/22   Zigmund Daniel., MD  Insulin Glargine [facility]) 100 UNIT/ML Inject 7 Units into the skin 2 (two) times daily. 11/13/22   Zigmund Daniel., MD  Insulin Pen Needle 32G X 4 MM MISC Use 3 (three) times daily. 11/13/22   Zigmund Daniel., MD  labetalol (NORMODYNE) 300 MG tablet Take 1 tablet (300 mg total) by mouth 2 (two) times daily. 11/13/22 01/12/23  Zigmund Daniel., MD  Lancet Device MISC 1 each by Does not apply route 3 (three) times daily. May dispense any manufacturer covered by patient's insurance. 11/13/22   Zigmund Daniel., MD  losartan (COZAAR) 100 MG tablet Take 1 tablet (100 mg total) by mouth daily. 11/13/22 12/13/22  Zigmund Daniel., MD  magnesium oxide (MAG-OX) 400 MG tablet Take 1 tablet (400 mg total) by mouth daily. 11/13/22 03/13/23  Zigmund Daniel., MD  montelukast (SINGULAIR) 10 MG tablet Take 10 mg by mouth at bedtime.    [provider]  mycophenolate (MYFORTIC) 180 MG EC tablet Take 4 tablets (720 mg total) by mouth 2 (two) times daily. Follow with your kidney doctor for refills 11/13/22 12/13/22  Zigmund Daniel., MD  naloxone [facility]) nasal spray 4 mg/0.1 mL  10/28/21   [provider]  pantoprazole (PROTONIX) 40 MG tablet Take 1 tablet (40 mg total) by mouth daily. For acid  reflux 11/26/17   Armandina Stammer I, NP  predniSONE (DELTASONE) 5 MG tablet Take 1 tablet (5 mg total) by mouth daily with breakfast. Follow with your kidney doctor for refills 11/13/22 12/13/22  Zigmund Daniel., MD  spironolactone (ALDACTONE) 50 MG tablet Take 1 tablet (50  mg total) by mouth daily. 11/13/22 12/13/22  Zigmund Daniel., MD  sulfamethoxazole-trimethoprim (BACTRIM) 400-80 MG tablet Take 1 tablet by mouth every Monday, Wednesday, and Friday. For infection 11/15/22 01/10/23  Zigmund Daniel., MD  tacrolimus (PROGRAF) 1 MG capsule Take 4 capsules (4 mg total) by mouth 2 (two) times daily. Follow with your kidney doctor for refills 11/13/22 12/13/22  Zigmund Daniel., MD     Critical care time:     CRITICAL CARE Performed by: Karren Burly   Total critical care time: 40 minutes  Critical care time was exclusive of separately billable procedures and treating other patients.  Critical care was necessary to treat or prevent imminent or life-threatening deterioration.  Critical care was time spent personally by me on the following activities: development of treatment plan with patient and/or surrogate as well as nursing, discussions with consultants, evaluation of patient's response to treatment, examination of patient, obtaining history from patient or surrogate, ordering and performing treatments and interventions, ordering and review of laboratory studies, ordering and review of radiographic studies, pulse oximetry and re-evaluation of patient's condition.   Karren Burly, MD See Loretha Stapler for contact info

## 2022-11-22 NOTE — ED Provider Notes (Signed)
[place] [facility] AT [facility] Provider Note   CSN: [id number] Arrival date & time: 11/22/22  1705     History  Chief Complaint  Patient presents with   Syncope    Linda Ellison is a 47 y.o. female with PMHx anxiety, CAD, HLD, HTN, ESRD s/p kidney transplant on 2 immunosuppressive drugs, GERD, DM, hyperthyroidism who presents to ED concerned for 2 syncopal episodes today. First episode happened when patient stool up too fast, she then sat down on the bedside commode and loss consciousness for a couple of seconds but did not fall down to the floor. Later in the day patient was walking in her living room when she became very dizzy and her son had to assist her to the ground. Denies head trauma. Patient was assisted down to floor.   Of note, patient started a new HTN medication 9 days ago when she was discharged from the hospital with PNA and AKI and reports intermittent dizziness episodes that were not as severe as today's syncopal episode.  Denies fever, chest pain, dyspnea, cough, nausea, vomiting, diarrhea, dysuria, hematuria, hematochezia. Denies head trauma, seizures, blood thinners.    HPI     Home Medications Prior to Admission medications   Medication Sig Start Date End Date Taking? Authorizing Provider  acetaminophen (TYLENOL) 325 MG tablet Take 2 tablets (650 mg total) by mouth every 6 (six) hours as needed for mild pain, fever or headache. 11/13/22   Zigmund Daniel., MD  albuterol (PROVENTIL HFA;VENTOLIN HFA) 108 (90 Base) MCG/ACT inhaler Inhale 2 puffs into the lungs every 6 (six) hours as needed for wheezing or shortness of breath. 11/25/17   Armandina Stammer I, NP  atorvastatin (LIPITOR) 10 MG tablet Take 1 tablet (10 mg total) by mouth daily. 11/13/22 12/13/22  Zigmund Daniel., MD  Blood Glucose Monitoring Suppl (ONETOUCH VERIO FLEX SYSTEM) w/Device KIT Use 3 (three) times daily. 11/13/22   Zigmund Daniel., MD  buPROPion  (WELLBUTRIN) 75 MG tablet Take 3 tablets (225 mg total) by mouth 2 (two) times daily. 11/13/22 01/12/23  Zigmund Daniel., MD  cetirizine (ZYRTEC) 10 MG tablet Take 1 tablet (10 mg total) by mouth daily. 11/13/22   Zigmund Daniel., MD  Dulaglutide 4.5 MG/0.5ML SOPN Inject 4.5 mg into the skin once a week. Every Tuesday 12/09/21   [provider]  folic acid (FOLVITE) 1 MG tablet Take 1 tablet (1 mg total) by mouth daily. 11/13/22 01/12/23  Zigmund Daniel., MD  gabapentin (NEURONTIN) 100 MG capsule Take 1 capsule (100 mg total) by mouth at bedtime. 11/13/22 01/12/23  Zigmund Daniel., MD  Glucose Blood (BLOOD GLUCOSE TEST STRIPS) STRP Use as directed to check blood sugar 3 (three) times daily. 11/13/22   Zigmund Daniel., MD  insulin aspart (NOVOLOG) 100 UNIT/ML FlexPen Inject 0-9 Units into the skin 3 (three) times daily with meals. CBG < 70: treat for low blood sugar CBG 121 - 150: 0 units           CBG 151 - 200: 1 unit CBG 201 - 250: 2 units           CBG 251 - 300: 3 units  CBG 301 - 350: 5 units           CBG 351 - 400: 7 units CBG > 400: 9 units and call MD 11/13/22   Zigmund Daniel., MD  Insulin Glargine (  BASAGLAR KWIKPEN) 100 UNIT/ML Inject 7 Units into the skin 2 (two) times daily. 11/13/22   Zigmund Daniel., MD  Insulin Pen Needle 32G X 4 MM MISC Use 3 (three) times daily. 11/13/22   Zigmund Daniel., MD  labetalol (NORMODYNE) 300 MG tablet Take 1 tablet (300 mg total) by mouth 2 (two) times daily. 11/13/22 01/12/23  Zigmund Daniel., MD  Lancet Device MISC 1 each by Does not apply route 3 (three) times daily. May dispense any manufacturer covered by patient's insurance. 11/13/22   Zigmund Daniel., MD  losartan (COZAAR) 100 MG tablet Take 1 tablet (100 mg total) by mouth daily. 11/13/22 12/13/22  Zigmund Daniel., MD  magnesium oxide (MAG-OX) 400 MG tablet Take 1 tablet (400 mg total) by mouth daily. 11/13/22 03/13/23  Zigmund Daniel., MD  montelukast (SINGULAIR) 10 MG tablet Take 10 mg by mouth at bedtime.    [provider]  mycophenolate (MYFORTIC) 180 MG EC tablet Take 4 tablets (720 mg total) by mouth 2 (two) times daily. Follow with your kidney doctor for refills 11/13/22 12/13/22  Zigmund Daniel., MD  naloxone [facility]) nasal spray 4 mg/0.1 mL  10/28/21   [provider]  pantoprazole (PROTONIX) 40 MG tablet Take 1 tablet (40 mg total) by mouth daily. For acid reflux 11/26/17   Armandina Stammer I, NP  predniSONE (DELTASONE) 5 MG tablet Take 1 tablet (5 mg total) by mouth daily with breakfast. Follow with your kidney doctor for refills 11/13/22 12/13/22  Zigmund Daniel., MD  spironolactone (ALDACTONE) 50 MG tablet Take 1 tablet (50 mg total) by mouth daily. 11/13/22 12/13/22  Zigmund Daniel., MD  sulfamethoxazole-trimethoprim (BACTRIM) 400-80 MG tablet Take 1 tablet by mouth every Monday, Wednesday, and Friday. For infection 11/15/22 01/10/23  Zigmund Daniel., MD  tacrolimus (PROGRAF) 1 MG capsule Take 4 capsules (4 mg total) by mouth 2 (two) times daily. Follow with your kidney doctor for refills 11/13/22 12/13/22  Zigmund Daniel., MD      Allergies    Ciprofloxacin, Fluocinolone, Hydromorphone, Strawberry extract, Hydromorphone hcl, Phenytoin sodium extended, Adhesive [tape], Chlorhexidine gluconate, Clindamycin, and Shrimp [shellfish allergy]    Review of Systems   Review of Systems  Neurological:  Positive for syncope.    Physical Exam Updated Vital Signs There were no vitals taken for this visit. Physical Exam Vitals and nursing note reviewed.  Constitutional:      General: She is not in acute distress.    Appearance: She is not ill-appearing or toxic-appearing.  HENT:     Head: Normocephalic and atraumatic.     Mouth/Throat:     Mouth: Mucous membranes are moist.  Eyes:     General: No scleral icterus.       Right eye: No discharge.        Left eye: No  discharge.     Conjunctiva/sclera: Conjunctivae normal.  Cardiovascular:     Rate and Rhythm: Regular rhythm. Tachycardia present.     Pulses: Normal pulses.     Heart sounds: Normal heart sounds. No murmur heard. Pulmonary:     Effort: Pulmonary effort is normal. No respiratory distress.     Breath sounds: Normal breath sounds. No wheezing, rhonchi or rales.  Abdominal:     General: Abdomen is flat. Bowel sounds are normal. There is no distension.     Palpations: Abdomen is soft. There is no mass.  Tenderness: There is no abdominal tenderness.  Musculoskeletal:     Right lower leg: No edema.     Left lower leg: No edema.  Skin:    General: Skin is warm and dry.     Findings: No rash.  Neurological:     General: No focal deficit present.     Mental Status: She is alert and oriented to person, place, and time. Mental status is at baseline.     Comments: GCS 15. Speech is goal oriented. No deficits appreciated to CN III-XII; symmetric eyebrow raise, no facial drooping, tongue midline. Patient has equal grip strength bilaterally with 5/5 strength against resistance in all major muscle groups bilaterally. Sensation to light touch intact. Patient moves extremities without ataxia.    Psychiatric:        Mood and Affect: Mood normal.        Behavior: Behavior normal.     ED Results / Procedures / Treatments   Labs (all labs ordered are listed, but only abnormal results are displayed) Labs Reviewed  BASIC METABOLIC PANEL  CBC WITH DIFFERENTIAL/PLATELET  HCG, QUANTITATIVE, PREGNANCY  CBG MONITORING, ED    EKG None  Radiology No results found.  Procedures Procedures    Medications Ordered in ED Medications - No data to display  ED Course/ Medical Decision Making/ A&P                                 Medical Decision Making Amount and/or Complexity of Data Reviewed Labs: ordered. ECG/medicine tests: ordered.   This patient presents to the ED for concern of  syncope, this involves an extensive number of treatment options, and is a complaint that carries with it a high risk of complications and morbidity.  The differential diagnosis includes CVA, ICH, intracranial mass, critical dehydration, endocrine abnormality, sepsis/infection, electrolyte abnormality, cardiac arrhythmia.   Co morbidities that complicate the patient evaluation  anxiety, CAD, HLD, ESRD, GERD, DM, hyperthyroidism   Lab Tests:  I Ordered, and personally interpreted labs.  The pertinent results include:   -hcg: pending -CBC: pending -BMP: pending -CBG: 329   Cardiac Monitoring: / EKG:  The patient was maintained on a cardiac monitor.  I personally viewed and interpreted the cardiac monitored which showed an underlying rhythm of: tachycardia   Problem List / ED Course / Critical interventions / Medication management  Patient presents to ED concerned for multiple syncopal episodes. Physical exam and neuro exam unremarkable. Patient mildly tachycardic but without fever or hypotension. Patient in no acute distress. Presentation is very concerning d/t  patient's recent hospitalization requiring intubation for PNA and transient dialysis for AKI. Of note, patient ambulated to the bathroom in ED and became dizzy again and was assisted to the floor. Patient resting comfortably in bed when I went to check in on her stating that she felt better. Patient's labs were delayed d/t patient being an extremely hard stick.  10PM Care of Linda Ellison transferred to PA [facility] Melia at the end of my shift as the patient will require reassessment once labs/imaging have resulted. Patient presentation, ED course, and plan of care discussed with review of all pertinent labs and imaging. Please see his/her note for further details regarding further ED course and disposition. Plan at time of handoff is reassess patient after labs and imaging result. I believe this patient will be appropriate for  inpatient admission. This may be altered or completely changed  at the discretion of the oncoming team pending results of further workup.     Social Determinants of Health:  none          Final Clinical Impression(s) / ED Diagnoses Final diagnoses:  None    Rx / DC Orders ED Discharge Orders     None         Margarita Rana 11/22/22 2212    Wynetta Fines, MD 11/23/22 0020

## 2022-11-22 NOTE — ED Notes (Signed)
Pt had an assisted fall in the bathroom.  Pt was lifted into a wheelchair with assistance.

## 2022-11-22 NOTE — ED Provider Notes (Signed)
47 year old female, former dialysis, had renal transplant, type 1 diabetes. Recently admitted to ICU for PNA with AKI with transient dialysis (10/09/22-11/13/22). Dc 1 week ago, returns for syncope x 2 episodes today- stands and feels light headed/hot. First time, fell getting off comode, 2nd episode stood and son caught her. No injuries.  Started on new BP meds at DC, had intermittent dizziness with start of new meds.  Denies fevers/vomiting/diarrhea.  Physical Exam  BP (!) 188/115   Pulse (!) 102   Temp 97.8 F (36.6 C) (Oral)   Resp (!) 28   Ht 5\' 6"  (1.676 m)   Wt 109.8 kg   SpO2 100%   BMI 39.06 kg/m   Physical Exam  Procedures  .Critical Care  Performed by: Jeannie Fend, PA-C Authorized by: Jeannie Fend, PA-C   Critical care provider statement:    Critical care time (minutes):  30   Critical care was time spent personally by me on the following activities:  Development of treatment plan with patient or surrogate, discussions with consultants, evaluation of patient's response to treatment, examination of patient, ordering and review of laboratory studies, ordering and review of radiographic studies, ordering and performing treatments and interventions, pulse oximetry, re-evaluation of patient's condition and review of old charts   ED Course / MDM    Medical Decision Making Amount and/or Complexity of Data Reviewed Labs: ordered. Radiology: ordered. ECG/medicine tests: ordered.  Risk Prescription drug management. Decision regarding hospitalization.   This patient presents to the ED for concern of syncope, HTN, recent admission to ICU intubated with PNA and AKI, with renal transplant needing transient dialysis during admission, this involves an extensive number of treatment options, and is a complaint that carries with it a high risk of complications and morbidity.  The differential diagnosis includes but not limited to renal failure, aneurysm, hypertensive  emergency/urgency, DKA   Co morbidities that complicate the patient evaluation  Type 1 diabetes, DKA, GERD, asthma, HLD, CAD, renal transplant    Additional history obtained:  Additional history obtained from prior provider at change of shift External records from outside source obtained and reviewed including recent dc summary for admission dated 10/09/22-11/13/22   Lab Tests:  I Ordered, and personally interpreted labs.  The pertinent results include:  CBG elevated at 329. CBC with hgb 11.6 not significantly changed from prior. Additional labs pending at time of admission to critical care   Imaging Studies ordered:  I ordered imaging studies including CXRm CT head  I independently visualized and interpreted imaging which showed no acute process/no acute abnormality  I agree with the radiologist interpretation   Cardiac Monitoring: / EKG:  The patient was maintained on a cardiac monitor.  I personally viewed and interpreted the cardiac monitored which showed an underlying rhythm of: sinus tach, rate 100, no significantly abnormal t-waves   Consultations Obtained:  I requested consultation with the Dr. Judeth Horn, CCM,  and discussed lab and imaging findings as well as pertinent plan - they recommend: admission    Problem List / ED Course / Critical interventions / Medication management  47 year old female presents with complaint of syncope x 2, feeling light headed. Concern for  significantly elevated BP in light of presenting complaint. BP at dc 106/89, now 200s/100s. BP not improved with labetalol. Discussed with ER attending, Dr. Rodena Medin, started on Cleviprex and admitted to ICU with concern for hypertensive urgency. Mentating normally, doubt PRESS. Concern for DKA, AKI with renal transplant.  I  have reviewed the patients home medicines and have made adjustments as needed   Social Determinants of Health:  Lives with family   Test / Admission -  Considered:  admit        Alden Hipp 11/22/22 2333    Wynetta Fines, MD 11/23/22 [phone]

## 2022-11-22 NOTE — H&P (Incomplete)
NAME:  Linda Ellison, MRN:  [id number], DOB:  [birth year], LOS: 0 ADMISSION DATE:  11/22/2022, CONSULTATION DATE:  11/22/22  REFERRING MD:  ED, CHIEF COMPLAINT:  Headache   History of Present Illness:  47 year old woman history of ESRD now status post kidney transplant presents with headache and elevated blood pressure concerning for hypertensive emergency.  Notably, had a recent admission for over a month with pneumonia requiring intubation and acute renal failure requiring dialysis now recovered.  At home have presyncope or syncope.  Some headache.  Deny significant trauma or fall but says she was walking at the time.  Was brought to the ED.  Noted to have elevated blood pressures 1 80-1 90 systolic.  CT head negative.  Labs relatively reassuring, normal renal function etc.  Pertinent  Medical History  ***  Significant Hospital Events: Including procedures, antibiotic start and stop dates in addition to other pertinent events     Interim History / Subjective:  ***  Objective   Blood pressure (!) 188/115, pulse (!) 102, temperature 97.8 F (36.6 C), temperature source Oral, resp. rate (!) 28, height 5\' 6"  (1.676 m), weight 109.8 kg, SpO2 100%.       No intake or output data in the 24 hours ending 11/22/22 2342 Filed Weights   11/22/22 1739  Weight: 109.8 kg    Examination: General: *** HENT: *** Lungs: *** Cardiovascular: *** Abdomen: *** Extremities: *** Neuro: *** GU: ***  Resolved Hospital Problem list   ***  Assessment & Plan:  ***  Best Practice (right click and "Reselect all SmartList Selections" daily)   Diet/type: {diet type:25684} DVT prophylaxis: {anticoagulation (Optional):25687} GI prophylaxis: {IO:96295} Lines: {Central Venous Access:25771} Foley:  {Central Venous Access:25691} Code Status:  {Code Status:26939} Last date of multidisciplinary goals of care discussion [***]  Labs   CBC: Recent Labs  Lab 11/22/22 2126  WBC 7.9  NEUTROABS 6.5   HGB 11.6*  HCT 40.4  MCV 86.7  PLT 193    Basic Metabolic Panel: Recent Labs  Lab 11/22/22 2304  NA 136  K 3.6  CL 106  CO2 18*  GLUCOSE 275*  BUN 8  CREATININE 0.67  CALCIUM 8.6*   GFR: Estimated Creatinine Clearance: 110.3 mL/min (by C-G formula based on SCr of 0.67 mg/dL). Recent Labs  Lab 11/22/22 2126  WBC 7.9    Liver Function Tests: Recent Labs  Lab 11/22/22 2304  AST 11*  ALT 18  ALKPHOS 77  BILITOT 0.5  PROT 6.9  ALBUMIN 3.1*   No results for input(s): "LIPASE", "AMYLASE" in the last 168 hours. Recent Labs  Lab 11/22/22 2304  AMMONIA 17    ABG    Component Value Date/Time   PHART 7.477 (H) 10/23/2022 0602   PCO2ART 34.5 10/23/2022 0602   PO2ART 67 (L) 10/23/2022 0602   HCO3 25.5 10/23/2022 0602   TCO2 27 10/23/2022 0602   ACIDBASEDEF 2.0 10/17/2022 0540   O2SAT 94 10/23/2022 0602     Coagulation Profile: No results for input(s): "INR", "PROTIME" in the last 168 hours.  Cardiac Enzymes: No results for input(s): "CKTOTAL", "CKMB", "CKMBINDEX", "TROPONINI" in the last 168 hours.  HbA1C: Hgb A1c MFr Bld  Date/Time Value Ref Range Status  08/27/2022 03:43 AM 6.7 (H) 4.8 - 5.6 % Final    Comment:    (NOTE)         Prediabetes: 5.7 - 6.4         Diabetes: >6.4  Glycemic control for adults with diabetes: <7.0   11/21/2017 06:49 PM 9.9 (H) 4.8 - 5.6 % Final    Comment:    (NOTE) Pre diabetes:          5.7%-6.4% Diabetes:              >6.4% Glycemic control for   <7.0% adults with diabetes     CBG: Recent Labs  Lab 11/22/22 1752  GLUCAP 329*    Review of Systems:   ***  Past Medical History:  She,  has a past medical history of Allergy, Anemia associated with chronic renal failure, Anxiety, Asthma, Atypical chest pain, AV (arteriovenous fistula) (HCC), CAD (coronary artery disease) (cardiologist-- dr Sharol Roussel [facility] in [place])), Depression, Elevated lipids, ESRD on hemodialysis (HCC)  (NEPHROLOGIST-  DR MATTINGLY), Gastroparesis, GERD (gastroesophageal reflux disease), History of pneumonia, Hyperlipidemia, Hyperthyroidism, Menorrhagia, Other secondary hypertension, Peripheral neuropathy, PONV (postoperative nausea and vomiting), Pre-transplant evaluation for kidney transplant, Renal failure syndrome (03/05/2022), Secondary hyperparathyroidism of renal origin [facility]), Sleep apnea, and Type 2 diabetes mellitus, with long-term current use of insulin (HCC).   Surgical History:   Past Surgical History:  Procedure Laterality Date  . AV FISTULA PLACEMENT  08/2010   Left radiocephalic AVF  . AV FISTULA PLACEMENT Right 05/07/2014   Procedure: ARTERIOVENOUS (AV) FISTULA CREATION;  Surgeon: Sherren Kerns, MD;  [facility];  Service: Vascular;  Laterality: Right;  . AV FISTULA PLACEMENT Right 07/02/2014   Procedure: INSERTION OF ARTERIOVENOUS (AV) GORE-TEX GRAFT ARM;  Surgeon: Sherren Kerns, MD;  Location: [facility] OR;  Service: Vascular;  Laterality: Right;  . CARDIOVASCULAR STRESS TEST  06/25/2016   [facility]   normal nuclear perfusion study w/ no ischemia/  normal LV function and wall motion , ef 51%  . CESAREAN SECTION  03/22/2001   w/ Bilateral Tubal Ligation  . COLONOSCOPY    . COLONOSCOPY WITH ESOPHAGOGASTRODUODENOSCOPY (EGD)  10/19/2011  . DILATION AND CURETTAGE OF UTERUS  05/12/2000   w/ suction for missed ab  . DOBUTAMINE STRESS ECHO  06/04/2016    [facility]   normal stress echo w/ no chest pain or ischemia/  normal LV function and wall motion , stress ef 60-65%  . ESOPHAGOGASTRODUODENOSCOPY  10/19/2011   Dr. Stan Head  . FEMUR IM NAIL Left child   removed  . FOOT SURGERY Left 2014 approx.  Marland Kitchen HYSTEROSCOPY WITH NOVASURE N/A 08/19/2016   Procedure: HYSTEROSCOPY WITH NOVASURE;  Surgeon: Huel Cote, MD;  Location: [facility];  Service: Gynecology;  Laterality: N/A;  . KIDNEY TRANSPLANT  09/18/2016  . LAPAROSCOPIC CHOLECYSTECTOMY  1994  . LEFT HEART  CATHETERIZATION WITH CORONARY ANGIOGRAM N/A 06/27/2012   Procedure: LEFT HEART CATHETERIZATION WITH CORONARY ANGIOGRAM;  Surgeon: Laurey Morale, MD;  Location[facility] LAB;  Service: Cardiovascular;  Laterality: N/A; No ostructive CAD, normal LVF, ef 55-60% (mild LAD luminal irregularities, moderate dRCA diffuse disease)  . LIGATION GORETEX FISTULA  01/04/11   Left AVF  . LIGATION OF ARTERIOVENOUS  FISTULA Left 08/21/2014   Procedure: LIGATION OF ARTERIOVENOUS  FISTULA;  Surgeon: Annice Needy, MD;  Location: [facility] ORS;  Service: Vascular;  Laterality: Left;  . LIGATION OF ARTERIOVENOUS  FISTULA Left 06/29/2017   Procedure: LIGATION OF ARTERIOVENOUS  FISTULA ( RADIOCEPHALIC );  Surgeon: Annice Needy, MD;  Location: [facility] ORS;  Service: Vascular;  Laterality: Left;  . NEPHRECTOMY TRANSPLANTED ORGAN    . PERIPHERAL VASCULAR CATHETERIZATION Left 08/08/2014   Procedure: Upper  Extremity Angiography;  Surgeon: Annice Needy, MD;  Location: [facility] INVASIVE CV LAB;  Service: Cardiovascular;  Laterality: Left;  . PERIPHERAL VASCULAR CATHETERIZATION Left 08/08/2014   Procedure: Upper Extremity Intervention;  Surgeon: Annice Needy, MD;  Location: [facility] INVASIVE CV LAB;  Service: Cardiovascular;  Laterality: Left;  . PERIPHERAL VASCULAR CATHETERIZATION Left 03/08/2016   Procedure: A/V Fistulagram;  Surgeon: Annice Needy, MD;  Location: [facility] INVASIVE CV LAB;  Service: Cardiovascular;  Laterality: Left;  . POLYPECTOMY    . THROMBECTOMY AND REVISION OF ARTERIOVENTOUS (AV) GORETEX  GRAFT Right 07/16/2014   Procedure: THROMBECTOMY  Right  arm  ARTERIOVENOUS  GORETEX  GRAFT;  Surgeon: Sherren Kerns, MD;  Location: [facility] OR;  Service: Vascular;  Laterality: Right;  . TRANSTHORACIC ECHOCARDIOGRAM  06/04/2016   mild concentric LVH, ef 55-60%,  mild TR,  borderline LAE,  trivial MR and PR     Social History:   reports that she has never smoked. She has never used smokeless tobacco. She reports that she does not drink alcohol and  does not use drugs.   Family History:  Her family history includes Arthritis in her brother; Diabetes in her father, maternal aunt, and paternal aunt; Heart disease in her brother, mother, and paternal aunt; Hypertension in her father, maternal aunt, and paternal aunt; Kidney disease in her maternal grandmother and mother; Kidney failure in her paternal aunt and paternal uncle. There is no history of Colon cancer, Colon polyps, Crohn's disease, Esophageal cancer, Rectal cancer, Stomach cancer, or Ulcerative colitis.   Allergies Allergies  Allergen Reactions  . Ciprofloxacin Hives, Itching and Nausea And Vomiting  . Fluocinolone Itching, Nausea And Vomiting and Other (See Comments)  . Hydromorphone Hives  . Strawberry Extract Itching and Other (See Comments)    Pt states that this medication makes her tongue raw.   No reaction  . Hydromorphone Hcl Hives  . Phenytoin Sodium Extended Itching  . Adhesive [Tape] Itching and Rash  . Chlorhexidine Gluconate Itching  . Clindamycin Diarrhea, Nausea And Vomiting and Rash  . Shrimp [Shellfish Allergy] Rash     Home Medications  Prior to Admission medications   Medication Sig Start Date End Date Taking? Authorizing Provider  acetaminophen (TYLENOL) 325 MG tablet Take 2 tablets (650 mg total) by mouth every 6 (six) hours as needed for mild pain, fever or headache. 11/13/22   Zigmund Daniel., MD  albuterol (PROVENTIL HFA;VENTOLIN HFA) 108 (90 Base) MCG/ACT inhaler Inhale 2 puffs into the lungs every 6 (six) hours as needed for wheezing or shortness of breath. 11/25/17   Armandina Stammer I, NP  atorvastatin (LIPITOR) 10 MG tablet Take 1 tablet (10 mg total) by mouth daily. 11/13/22 12/13/22  Zigmund Daniel., MD  Blood Glucose Monitoring Suppl (ONETOUCH VERIO FLEX SYSTEM) w/Device KIT Use 3 (three) times daily. 11/13/22   Zigmund Daniel., MD  buPROPion (WELLBUTRIN) 75 MG tablet Take 3 tablets (225 mg total) by mouth 2 (two) times daily.  11/13/22 01/12/23  Zigmund Daniel., MD  cetirizine (ZYRTEC) 10 MG tablet Take 1 tablet (10 mg total) by mouth daily. 11/13/22   Zigmund Daniel., MD  Dulaglutide 4.5 MG/0.5ML SOPN Inject 4.5 mg into the skin once a week. Every Tuesday 12/09/21   [provider]  folic acid (FOLVITE) 1 MG tablet Take 1 tablet (1 mg total) by mouth daily. 11/13/22 01/12/23  Zigmund Daniel., MD  gabapentin (NEURONTIN) 100 MG capsule Take 1  capsule (100 mg total) by mouth at bedtime. 11/13/22 01/12/23  Zigmund Daniel., MD  Glucose Blood (BLOOD GLUCOSE TEST STRIPS) STRP Use as directed to check blood sugar 3 (three) times daily. 11/13/22   Zigmund Daniel., MD  insulin aspart (NOVOLOG) 100 UNIT/ML FlexPen Inject 0-9 Units into the skin 3 (three) times daily with meals. CBG < 70: treat for low blood sugar CBG 121 - 150: 0 units           CBG 151 - 200: 1 unit CBG 201 - 250: 2 units           CBG 251 - 300: 3 units  CBG 301 - 350: 5 units           CBG 351 - 400: 7 units CBG > 400: 9 units and call MD 11/13/22   Zigmund Daniel., MD  Insulin Glargine [facility]) 100 UNIT/ML Inject 7 Units into the skin 2 (two) times daily. 11/13/22   Zigmund Daniel., MD  Insulin Pen Needle 32G X 4 MM MISC Use 3 (three) times daily. 11/13/22   Zigmund Daniel., MD  labetalol (NORMODYNE) 300 MG tablet Take 1 tablet (300 mg total) by mouth 2 (two) times daily. 11/13/22 01/12/23  Zigmund Daniel., MD  Lancet Device MISC 1 each by Does not apply route 3 (three) times daily. May dispense any manufacturer covered by patient's insurance. 11/13/22   Zigmund Daniel., MD  losartan (COZAAR) 100 MG tablet Take 1 tablet (100 mg total) by mouth daily. 11/13/22 12/13/22  Zigmund Daniel., MD  magnesium oxide (MAG-OX) 400 MG tablet Take 1 tablet (400 mg total) by mouth daily. 11/13/22 03/13/23  Zigmund Daniel., MD  montelukast (SINGULAIR) 10 MG tablet Take 10 mg by mouth at bedtime.     [provider]  mycophenolate (MYFORTIC) 180 MG EC tablet Take 4 tablets (720 mg total) by mouth 2 (two) times daily. Follow with your kidney doctor for refills 11/13/22 12/13/22  Zigmund Daniel., MD  naloxone [facility]) nasal spray 4 mg/0.1 mL  10/28/21   [provider]  pantoprazole (PROTONIX) 40 MG tablet Take 1 tablet (40 mg total) by mouth daily. For acid reflux 11/26/17   Armandina Stammer I, NP  predniSONE (DELTASONE) 5 MG tablet Take 1 tablet (5 mg total) by mouth daily with breakfast. Follow with your kidney doctor for refills 11/13/22 12/13/22  Zigmund Daniel., MD  spironolactone (ALDACTONE) 50 MG tablet Take 1 tablet (50 mg total) by mouth daily. 11/13/22 12/13/22  Zigmund Daniel., MD  sulfamethoxazole-trimethoprim (BACTRIM) 400-80 MG tablet Take 1 tablet by mouth every Monday, Wednesday, and Friday. For infection 11/15/22 01/10/23  Zigmund Daniel., MD  tacrolimus (PROGRAF) 1 MG capsule Take 4 capsules (4 mg total) by mouth 2 (two) times daily. Follow with your kidney doctor for refills 11/13/22 12/13/22  Zigmund Daniel., MD     Critical care time: ***

## 2022-11-23 ENCOUNTER — Inpatient Hospital Stay (HOSPITAL_COMMUNITY): Payer: Medicare (Managed Care)

## 2022-11-23 ENCOUNTER — Encounter (HOSPITAL_COMMUNITY): Payer: Self-pay | Admitting: Pulmonary Disease

## 2022-11-23 ENCOUNTER — Other Ambulatory Visit: Payer: Self-pay

## 2022-11-23 DIAGNOSIS — R55 Syncope and collapse: Secondary | ICD-10-CM

## 2022-11-23 DIAGNOSIS — I161 Hypertensive emergency: Secondary | ICD-10-CM | POA: Diagnosis not present

## 2022-11-23 LAB — URINALYSIS, W/ REFLEX TO CULTURE (INFECTION SUSPECTED)
Bilirubin Urine: NEGATIVE
Glucose, UA: >=500 mg/dL — AB
Ketones, ur: 5 mg/dL — AB
Leukocytes,Ua: NEGATIVE
Nitrite: NEGATIVE
Protein, ur: 100 mg/dL — AB
Specific Gravity, Urine: 1.016 (ref 1.005–1.030)
pH: 6 (ref 5.0–8.0)

## 2022-11-23 LAB — BASIC METABOLIC PANEL
Anion gap: 10 (ref 5–15)
Anion gap: 11 (ref 5–15)
BUN: 9 mg/dL (ref 6–20)
BUN: 8 mg/dL (ref 6–20)
CO2: 19 mmol/L — ABNORMAL LOW (ref 22–32)
CO2: 16 mmol/L — ABNORMAL LOW (ref 22–32)
Calcium: 8.3 mg/dL — ABNORMAL LOW (ref 8.9–10.3)
Calcium: 8.4 mg/dL — ABNORMAL LOW (ref 8.9–10.3)
Chloride: 104 mmol/L (ref 98–111)
Chloride: 109 mmol/L (ref 98–111)
Creatinine, Ser: 0.77 mg/dL (ref 0.44–1.00)
Creatinine, Ser: 0.87 mg/dL (ref 0.44–1.00)
GFR, Estimated: >60 mL/min (ref >60)
GFR, Estimated: >60 mL/min (ref >60)
Glucose, Bld: 273 mg/dL — ABNORMAL HIGH (ref 70–99)
Glucose, Bld: 262 mg/dL — ABNORMAL HIGH (ref 70–99)
Potassium: 3 mmol/L — ABNORMAL LOW (ref 3.5–5.1)
Potassium: 4 mmol/L (ref 3.5–5.1)
Sodium: 133 mmol/L — ABNORMAL LOW (ref 135–145)
Sodium: 136 mmol/L (ref 135–145)

## 2022-11-23 LAB — CBC
HCT: 36 % (ref 36.0–46.0)
Hemoglobin: 11.1 g/dL — ABNORMAL LOW (ref 12.0–15.0)
MCH: 25.2 pg — ABNORMAL LOW (ref 26.0–34.0)
MCHC: 30.8 g/dL (ref 30.0–36.0)
MCV: 81.6 fL (ref 80.0–100.0)
Platelets: 221 10*3/uL (ref 150–400)
RBC: 4.41 MIL/uL (ref 3.87–5.11)
RDW: 15.9 % — ABNORMAL HIGH (ref 11.5–15.5)
WBC: 8.7 10*3/uL (ref 4.0–10.5)
nRBC: 0 % (ref 0.0–0.2)

## 2022-11-23 LAB — PHOSPHORUS: Phosphorus: 3.1 mg/dL (ref 2.5–4.6)

## 2022-11-23 LAB — GLUCOSE, CAPILLARY
Glucose-Capillary: 246 mg/dL — ABNORMAL HIGH (ref 70–99)
Glucose-Capillary: 246 mg/dL — ABNORMAL HIGH (ref 70–99)
Glucose-Capillary: 225 mg/dL — ABNORMAL HIGH (ref 70–99)
Glucose-Capillary: 178 mg/dL — ABNORMAL HIGH (ref 70–99)
Glucose-Capillary: 213 mg/dL — ABNORMAL HIGH (ref 70–99)
Glucose-Capillary: 263 mg/dL — ABNORMAL HIGH (ref 70–99)

## 2022-11-23 LAB — MAGNESIUM
Magnesium: 1.3 mg/dL — ABNORMAL LOW (ref 1.7–2.4)
Magnesium: 2.3 mg/dL (ref 1.7–2.4)

## 2022-11-23 LAB — HCG, QUANTITATIVE, PREGNANCY: hCG, Beta Chain, Quant, S: 1 m[IU]/mL (ref <5)

## 2022-11-23 LAB — LACTIC ACID, PLASMA: Lactic Acid, Venous: 1.1 mmol/L (ref 0.5–1.9)

## 2022-11-23 LAB — MRSA NEXT GEN BY PCR, NASAL: MRSA by PCR Next Gen: NOT DETECTED

## 2022-11-23 MED ORDER — MAGNESIUM SULFATE 2 GM/50ML IV SOLN
2.0000 g | Freq: Once | INTRAVENOUS | Status: AC
  Administered 2022-11-23: 2 g via INTRAVENOUS
  Filled 2022-11-23: qty 50

## 2022-11-23 MED ORDER — POTASSIUM CHLORIDE CRYS ER 20 MEQ PO TBCR
20.0000 meq | EXTENDED_RELEASE_TABLET | ORAL | Status: AC
  Administered 2022-11-23 (×2): 20 meq via ORAL
  Filled 2022-11-23 (×2): qty 1

## 2022-11-23 MED ORDER — HYDRALAZINE HCL 20 MG/ML IJ SOLN
20.0000 mg | INTRAMUSCULAR | Status: DC | PRN
  Administered 2022-11-24: 20 mg via INTRAVENOUS
  Filled 2022-11-23: qty 1

## 2022-11-23 MED ORDER — INSULIN GLARGINE-YFGN 100 UNIT/ML ~~LOC~~ SOLN
9.0000 [IU] | Freq: Two times a day (BID) | SUBCUTANEOUS | Status: DC
  Administered 2022-11-23 – 2022-11-26 (×7): 9 [IU] via SUBCUTANEOUS
  Filled 2022-11-23 (×9): qty 0.09

## 2022-11-23 MED ORDER — MAGNESIUM SULFATE 4 GM/100ML IV SOLN
4.0000 g | Freq: Once | INTRAVENOUS | Status: AC
  Administered 2022-11-23: 4 g via INTRAVENOUS
  Filled 2022-11-23: qty 100

## 2022-11-23 MED ORDER — POTASSIUM CHLORIDE 10 MEQ/100ML IV SOLN
10.0000 meq | INTRAVENOUS | Status: AC
  Administered 2022-11-23 (×4): 10 meq via INTRAVENOUS
  Filled 2022-11-23 (×4): qty 100

## 2022-11-23 MED ORDER — ORAL CARE MOUTH RINSE: 15.0000 mL | OROMUCOSAL | Status: DC | PRN

## 2022-11-23 MED ORDER — SPIRONOLACTONE 25 MG PO TABS
50.0000 mg | ORAL_TABLET | Freq: Two times a day (BID) | ORAL | Status: DC
  Administered 2022-11-23 – 2022-11-26 (×6): 50 mg via ORAL
  Filled 2022-11-23 (×6): qty 2

## 2022-11-23 MED ORDER — CLEVIDIPINE BUTYRATE 0.5 MG/ML IV EMUL
0.0000 mg/h | INTRAVENOUS | Status: DC
  Administered 2022-11-23: 2 mg/h via INTRAVENOUS
  Filled 2022-11-23: qty 100

## 2022-11-23 MED ORDER — CALCIUM CARBONATE ANTACID 500 MG PO CHEW
1.0000 | CHEWABLE_TABLET | Freq: Three times a day (TID) | ORAL | Status: DC | PRN
  Administered 2022-11-23: 200 mg via ORAL
  Filled 2022-11-23: qty 1

## 2022-11-23 NOTE — TOC Initial Note (Signed)
Transition of Care [facility]) - Initial/Assessment Note   Patient Details  Name: Linda Ellison MRN: [id number] Date of Birth: 1976-03-08  [facility]) CM/SW Contact:    Ewing Schlein, LCSW Phone Number: 11/23/2022, 11:44 AM  Clinical Narrative: Patient is active with [facility] for [facility] and will need new orders at discharge for PT/OT/RN/SW/aide. TOC to follow.  Expected Discharge Plan: Home w Home Health Services Barriers to Discharge: Continued Medical Work up  Expected Discharge Plan and Services In-house Referral: Clinical Social Work Post Acute Care Choice: Home Health Living arrangements for the past 2 months: Single Family Home           DME Arranged: N/A DME Agency: NA HH Arranged: RN, PT, OT, Nurse's Aide, Social Work [employer] Agency: [facility] Date HH Agency Contacted: 11/23/22 Time HH Agency Contacted: 1119 Representative spoke with at [facility] Agency: Cindie  Prior Living Arrangements/Services Living arrangements for the past 2 months: Single Family Home Lives with:: Significant Other Patient language and need for interpreter reviewed:: Yes Need for Family Participation in Patient Care: Yes (Comment) Care giver support system in place?: Yes (comment) Current home services: DME, Homehealth aide, Home OT, Home PT, Home RN Criminal Activity/Legal Involvement Pertinent to Current Situation/Hospitalization: No - Comment as needed  Activities of Daily Living Home Assistive Devices/Equipment: CBG Meter, Bedside commode/3-in-1 ADL Screening (condition at time of admission) Patient's cognitive ability adequate to safely complete daily activities?: Yes Is the patient deaf or have difficulty hearing?: No Does the patient have difficulty seeing, even when wearing glasses/contacts?: No Does the patient have difficulty concentrating, remembering, or making decisions?: No Patient able to express need for assistance with ADLs?: Yes Does the patient have difficulty dressing  or bathing?: No Independently performs ADLs?: Yes (appropriate for developmental age) Does the patient have difficulty walking or climbing stairs?: No Weakness of Legs: None Weakness of Arms/Hands: None  Emotional Assessment Orientation: : Oriented to Self, Oriented to Place, Oriented to  Time, Oriented to Situation Alcohol / Substance Use: Not Applicable Psych Involvement: No (comment)  Admission diagnosis:  Hypertensive urgency [I16.0] Hypertensive emergency [I16.1] Patient Active Problem List   Diagnosis Date Noted   Hypertensive emergency 11/22/2022   Septic shock (HCC) 10/14/2022   History of CAD (coronary artery disease) 10/10/2022   Essential hypertension 10/10/2022   Acute hypoxic respiratory failure (HCC) 10/10/2022   Peripheral neuropathy 10/10/2022   Generalized anxiety disorder 10/10/2022   Hypocalcemia 10/10/2022   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) 10/10/2022   Multifocal pneumonia 10/09/2022   Protein-calorie malnutrition, severe 08/28/2022   AKI (acute kidney injury) of transplanted kidney (HCC) 08/26/2022   Nausea vomiting and diarrhea 08/26/2022   Hypokalemia 08/26/2022   Hypomagnesemia 08/26/2022   Dehydration 08/26/2022   Renal failure syndrome 03/05/2022   Renal artery stenosis, transplant (HCC) 01/03/2022   Snoring 04/02/2019   OSA (obstructive sleep apnea) 01/31/2019   Chronic anemia 03/03/2018   Abnormal uterine bleeding 03/03/2018   Abnormal cervical Papanicolaou smear 03/03/2018   Postcoital bleeding 03/03/2018   PTSD (post-traumatic stress disorder) 11/22/2017   MDD (major depressive disorder), recurrent, severe, with psychosis (HCC) 11/21/2017   Renovascular hypertension 09/05/2017   Steal syndrome of dialysis vascular access (HCC) 06/16/2017   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   Left arm cellulitis 12/08/2016   Metabolic acidosis 11/06/2016   Immunosuppressed status (HCC) 10/25/2016   PNA  (pneumonia) 09/08/2015   Migraine without aura and responsive to treatment 08/26/2015  Prolonged QT interval 05/14/2015   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) 05/06/2015   Hyperthyroidism 05/06/2015   CAD (coronary artery disease) 04/01/2015   Chronic chest pain 04/01/2015   Gastroparesis due to DM (HCC) 04/12/2013   Chronic constipation 04/10/2013   Irritant dermatitis 02/22/2013   Scarring alopecia 10/19/2012   Personal history of adenomatouscolonic polyps 10/26/2011   Asthma, chronic, unspecified asthma severity, with acute exacerbation 02-20-11   DM (diabetes mellitus) type II controlled with renal manifestation (HCC) 02/20/2011   Diabetic nephropathy (HCC) 02-20-11   Insulin dependent diabetes mellitus type IA (HCC) 02/20/11   Heart failure (HCC) 02-20-11   Status post deceased-donor kidney transplantation 12/21/2008   Hyperkalemia 12/19/2008   Obesity, unspecified 08/08/2008   GERD (gastroesophageal reflux disease) 08/08/2008   Personal history presenting hazards to health 08/08/2008   Essential hypertension 04/06/1999   PCP:  Daylene Katayama, PA Pharmacy:   [id number] [place], [place] - [address] [place] [address] Phone: [phone] Fax: [phone]  [facility] [address] [place] [address] Phone: [phone] Fax: [phone]  Social Determinants of Health (SDOH) Social History: SDOH Screenings   Food Insecurity: No Food Insecurity (11/23/2022)  Housing: Low Risk  (11/23/2022)  Recent Concern: Housing - Medium Risk (11/07/2022)  Transportation Needs: No Transportation Needs (11/23/2022)  Utilities: Not At Risk (11/23/2022)  Alcohol Screen: Low Risk  (11/21/2017)  Financial Resource Strain: Low Risk  (05/13/2021)   [facility], [facility] visits prior to 06/05/2022., [facility] visits prior  to 06/05/2022.  Physical Activity: Unknown (05/13/2021)   [facility], [facility] prior to 06/05/2022., [facility] visits prior to 06/05/2022.  Social Connections: Unknown (08/16/2021)   Received from [facility], [facility]  Stress: No Stress Concern Present (05/13/2021)   [facility], [facility] prior to 06/05/2022., [facility] visits prior to 06/05/2022.  Tobacco Use: Low Risk  (11/23/2022)   SDOH Interventions:    Readmission Risk Interventions    11/23/2022   11:42 AM 10/11/2022    3:46 PM  Readmission Risk Prevention Plan  Transportation Screening Complete Complete  Medication Review (RN Care Manager) Complete Referral to Pharmacy  PCP or Specialist appointment within 3-5 days of discharge  Complete  HRI or Home Care Consult Complete Complete  SW Recovery Care/Counseling Consult Complete Complete  Palliative Care Screening Not Applicable Not Applicable  Skilled Nursing Facility Not Applicable Not Applicable

## 2022-11-23 NOTE — ED Notes (Signed)
ED TO INPATIENT HANDOFF REPORT  Name/Age/Gender Linda Ellison Lines 47 y.o. female  Code Status    Code Status Orders  (From admission, onward)           Start     Ordered   11/22/22 2347  Full code  Continuous       Question:  By:  Answer:  Consent: discussion documented in EHR   11/22/22 2350           Code Status History     Date Active Date Inactive Code Status Order ID Comments User Context   11/08/2022 0912 11/13/2022 2140 Full Code [id number]  Lonia Blood, MD Inpatient   10/12/2022 1447 10/21/2022 0114 Full Code [id number]  Modena Slater, DO Inpatient   10/10/2022 0109 10/12/2022 1447 Full Code [id number]  Tereasa Coop, MD Inpatient   08/26/2022 2316 08/28/2022 1742 Full Code [id number]  Therisa Doyne, MD ED   11/21/2017 1428 11/25/2017 1707 Full Code [id number]  [name], NP Inpatient   11/20/2017 1928 11/21/2017 1345 Full Code [id number]  Street, [place], [place] ED   09/08/2015 2258 09/11/2015 2033 Full Code [id number]  Albertine Grates, MD Inpatient   07/12/2015 0311 07/14/2015 2043 Full Code [id number]  Lorretta Harp, MD ED   06/26/2012 2151 06/27/2012 1345 Full Code [id number]  Tonny Bollman, MD Inpatient       Home/SNF/Other Home  Chief Complaint Hypertensive emergency [I16.1]  Level of Care/Admitting Diagnosis ED Disposition     ED Disposition  Admit   Condition  --   Comment  Hospital Area: [facility] [[id number]]  Level of Care: ICU [6]  May admit patient to [facility] or [facility] if equivalent level of care is available:: No  Covid Evaluation: Asymptomatic - no recent exposure (last 10 days) testing not required  Diagnosis: Hypertensive emergency [phone]  Admitting Physician: Karren Burly [[id number]]  Attending Physician: Karren Burly [[id number]]  Certification:: I certify this patient will need inpatient services for at least 2 midnights  Expected Medical Readiness: 11/25/2022          Medical History Past Medical History:   Diagnosis Date   Allergy    Anemia associated with chronic renal failure    Anxiety    Asthma    Atypical chest pain    long-standing -- normal cardio cath 06-27-2012 and normal nuclear stress test 06-25-2016   AV (arteriovenous fistula) (HCC)    for dialysis-- currently located left radiocephalic   CAD (coronary artery disease) cardiologist-- dr Sharol Roussel [facility] [place] in [place])   a. False positive stress echo 06/2012 at [facility] - cath with no obstructive CAD at [facility] (mild luminal irregularities in LAD, moderate diffuse disease in distal RCA).   Depression    Elevated lipids    ESRD on hemodialysis [facility]) NEPHROLOGIST-  DR MATTINGLY   started dialysis 01/29/11: [facility] on MWF   Gastroparesis    GERD (gastroesophageal reflux disease)    History of pneumonia    HCAP 04/ 2017   Hyperlipidemia    Hyperthyroidism    Menorrhagia    Other secondary hypertension    associated to diabetes -- followed by cardiologist ( dr Sharol Roussel)     Peripheral neuropathy    PONV (postoperative nausea and vomiting)    Pre-transplant evaluation for kidney transplant    [facility]   Renal failure syndrome 03/05/2022   Secondary hyperparathyroidism of renal origin [facility])    Sleep apnea  Type 2 diabetes mellitus, with long-term current use of insulin (HCC)    dx 1985    Allergies Allergies  Allergen Reactions   Ciprofloxacin Hives, Itching and Nausea And Vomiting   Fluocinolone Itching, Nausea And Vomiting and Other (See Comments)   Hydromorphone Hives   Strawberry Extract Itching and Other (See Comments)    Pt states that this medication makes her tongue raw.   No reaction   Hydromorphone Hcl Hives   Phenytoin Sodium Extended Itching   Adhesive [Tape] Itching and Rash   Chlorhexidine Gluconate Itching   Clindamycin Diarrhea, Nausea And Vomiting and Rash   Shrimp [Shellfish Allergy] Rash    IV  Location/Drains/Wounds Patient Lines/Drains/Airways Status     Active Line/Drains/Airways     Name Placement date Placement time Site Days   Peripheral IV 11/22/22 22 G 2.5" Anterior;Right Forearm 11/22/22  2034  Forearm  1   Fistula / Graft Left Forearm Arteriovenous fistula --  --  Forearm  --            Labs/Imaging Results for orders placed or performed during the hospital encounter of 11/22/22 (from the past 48 hour(s))  CBG monitoring, ED     Status: Abnormal   Collection Time: 11/22/22  5:52 PM  Result Value Ref Range   Glucose-Capillary 329 (H) 70 - 99 mg/dL    Comment: Glucose reference range applies only to samples taken after fasting for at least 8 hours.  CBC WITH DIFFERENTIAL     Status: Abnormal   Collection Time: 11/22/22  9:26 PM  Result Value Ref Range   WBC 7.9 4.0 - 10.5 K/uL   RBC 4.66 3.87 - 5.11 MIL/uL   Hemoglobin 11.6 (L) 12.0 - 15.0 g/dL   HCT 95.2 84.1 - 32.4 %   MCV 86.7 80.0 - 100.0 fL   MCH 24.9 (L) 26.0 - 34.0 pg   MCHC 28.7 (L) 30.0 - 36.0 g/dL   RDW 40.1 (H) 02.7 - 25.3 %   Platelets 193 150 - 400 K/uL    Comment: REPEATED TO VERIFY   nRBC 0.0 0.0 - 0.2 %   Neutrophils Relative % 81 %   Neutro Abs 6.5 1.7 - 7.7 K/uL   Lymphocytes Relative 11 %   Lymphs Abs 0.8 0.7 - 4.0 K/uL   Monocytes Relative 6 %   Monocytes Absolute 0.5 0.1 - 1.0 K/uL   Eosinophils Relative 0 %   Eosinophils Absolute 0.0 0.0 - 0.5 K/uL   Basophils Relative 1 %   Basophils Absolute 0.1 0.0 - 0.1 K/uL   Immature Granulocytes 1 %   Abs Immature Granulocytes 0.06 0.00 - 0.07 K/uL    Comment: Performed at [facility], [address] [place], [place] [address]  Resp panel by RT-PCR (RSV, Flu A&B, Covid) Anterior Nasal Swab     Status: None   Collection Time: 11/22/22 10:36 PM   Specimen: Anterior Nasal Swab  Result Value Ref Range   SARS Coronavirus 2 by RT PCR NEGATIVE NEGATIVE    Comment: (NOTE) SARS-CoV-2 target nucleic acids are NOT  DETECTED.  The SARS-CoV-2 RNA is generally detectable in upper respiratory specimens during the acute phase of infection. The lowest concentration of SARS-CoV-2 viral copies this assay can detect is 138 copies/mL. A negative result does not preclude SARS-Cov-2 infection and should not be used as the sole basis for treatment or other patient management decisions. A negative result may occur with  improper specimen collection/handling, submission of specimen  other than nasopharyngeal swab, presence of viral mutation(s) within the areas targeted by this assay, and inadequate number of viral copies(<138 copies/mL). A negative result must be combined with clinical observations, patient history, and epidemiological information. The expected result is Negative.  Fact Sheet for Patients:  BloggerCourse.com  Fact Sheet for Healthcare Providers:  SeriousBroker.it  This test is no t yet approved or cleared by the [place] FDA and  has been authorized for detection and/or diagnosis of SARS-CoV-2 by FDA under an Emergency Use Authorization (EUA). This EUA will remain  in effect (meaning this test can be used) for the duration of the COVID-19 declaration under Section 564(b)(1) of the Act, 21 U.S.C.section 360bbb-3(b)(1), unless the authorization is terminated  or revoked sooner.       Influenza A by PCR NEGATIVE NEGATIVE   Influenza B by PCR NEGATIVE NEGATIVE    Comment: (NOTE) The Xpert Xpress SARS-CoV-2/FLU/RSV plus assay is intended as an aid in the diagnosis of influenza from Nasopharyngeal swab specimens and should not be used as a sole basis for treatment. Nasal washings and aspirates are unacceptable for Xpert Xpress SARS-CoV-2/FLU/RSV testing.  Fact Sheet for Patients: BloggerCourse.com  Fact Sheet for Healthcare Providers: SeriousBroker.it  This test is not yet approved or  cleared by the [place] FDA and has been authorized for detection and/or diagnosis of SARS-CoV-2 by FDA under an Emergency Use Authorization (EUA). This EUA will remain in effect (meaning this test can be used) for the duration of the COVID-19 declaration under Section 564(b)(1) of the Act, 21 U.S.C. section 360bbb-3(b)(1), unless the authorization is terminated or revoked.     Resp Syncytial Virus by PCR NEGATIVE NEGATIVE    Comment: (NOTE) Fact Sheet for Patients: BloggerCourse.com  Fact Sheet for Healthcare Providers: SeriousBroker.it  This test is not yet approved or cleared by the [place] FDA and has been authorized for detection and/or diagnosis of SARS-CoV-2 by FDA under an Emergency Use Authorization (EUA). This EUA will remain in effect (meaning this test can be used) for the duration of the COVID-19 declaration under Section 564(b)(1) of the Act, 21 U.S.C. section 360bbb-3(b)(1), unless the authorization is terminated or revoked.  Performed at [facility], [address], [place], [place] [address]   Ammonia     Status: None   Collection Time: 11/22/22 11:04 PM  Result Value Ref Range   Ammonia 17 9 - 35 umol/L    Comment: Performed at [facility], [address] [place], [place] [address]  Basic metabolic panel     Status: Abnormal   Collection Time: 11/22/22 11:04 PM  Result Value Ref Range   Sodium 136 135 - 145 mmol/L   Potassium 3.6 3.5 - 5.1 mmol/L   Chloride 106 98 - 111 mmol/L   CO2 18 (L) 22 - 32 mmol/L   Glucose, Bld 275 (H) 70 - 99 mg/dL    Comment: Glucose reference range applies only to samples taken after fasting for at least 8 hours.   BUN 8 6 - 20 mg/dL   Creatinine, Ser 3.08 0.44 - 1.00 mg/dL   Calcium 8.6 (L) 8.9 - 10.3 mg/dL   GFR, Estimated >65 >78 mL/min    Comment: (NOTE) Calculated using the CKD-EPI Creatinine Equation (2021)    Anion  gap 12 5 - 15    Comment: Performed at [facility], [address] [place], [place] [address]  Hepatic function panel     Status: Abnormal  Collection Time: 11/22/22 11:04 PM  Result Value Ref Range   Total Protein 6.9 6.5 - 8.1 g/dL   Albumin 3.1 (L) 3.5 - 5.0 g/dL   AST 11 (L) 15 - 41 U/L   ALT 18 0 - 44 U/L   Alkaline Phosphatase 77 38 - 126 U/L   Total Bilirubin 0.5 0.3 - 1.2 mg/dL   Bilirubin, Direct <9.5 0.0 - 0.2 mg/dL   Indirect Bilirubin NOT CALCULATED 0.3 - 0.9 mg/dL    Comment: Performed at [facility], [address] [place], [place] [address]   DG Chest Port 1 View  Result Date: 11/22/2022 CLINICAL DATA:  Syncope. EXAM: PORTABLE CHEST 1 VIEW COMPARISON:  November 08, 2022 FINDINGS: The heart size and mediastinal contours are within normal limits. Both lungs are clear. The visualized skeletal structures are unremarkable. IMPRESSION: No active disease. Electronically Signed   By: Aram Candela M.D.   On: 11/22/2022 22:49   CT Head Wo Contrast  Result Date: 11/22/2022 CLINICAL DATA:  Syncopal episode EXAM: CT HEAD WITHOUT CONTRAST TECHNIQUE: Contiguous axial images were obtained from the base of the skull through the vertex without intravenous contrast. RADIATION DOSE REDUCTION: This exam was performed according to the departmental dose-optimization program which includes automated exposure control, adjustment of the mA and/or kV according to patient size and/or use of iterative reconstruction technique. COMPARISON:  11/09/2022 FINDINGS: Brain: No evidence of acute infarction, hemorrhage, mass, mass effect, or midline shift. No hydrocephalus or extra-axial fluid collection. Partial empty sella. Normal cerebral volume for age. Vascular: No hyperdense vessel. Skull: Negative for fracture or focal lesion. Sinuses/Orbits: No acute finding. Other: The mastoid air cells are well aerated. IMPRESSION: No acute intracranial process. Electronically  Signed   By: Wiliam Ke M.D.   On: 11/22/2022 22:39    Pending Labs Unresulted Labs (From admission, onward)     Start     Ordered   11/29/22 0500  Creatinine, serum  (enoxaparin (LOVENOX)    CrCl >/= 30 ml/min)  Weekly,   R     Comments: while on enoxaparin therapy    11/22/22 2350   11/23/22 0500  CBC  Tomorrow morning,   R        11/22/22 2350   11/23/22 0500  Basic metabolic panel  Tomorrow morning,   R        11/22/22 2350   11/23/22 0500  Magnesium  Tomorrow morning,   R        11/22/22 2350   11/23/22 0500  Phosphorus  Tomorrow morning,   R        11/22/22 2350   11/22/22 2346  CBC  (enoxaparin (LOVENOX)    CrCl >/= 30 ml/min)  Once,   R       Comments: Baseline for enoxaparin therapy IF NOT ALREADY DRAWN.  Notify MD if PLT < 100 K.    11/22/22 2350   11/22/22 2200  hCG, quantitative, pregnancy  Once,   R        11/22/22 2200   11/22/22 2146  Culture, blood (routine x 2)  BLOOD CULTURE X 2,   R (with STAT occurrences)      11/22/22 2145   11/22/22 2144  Urinalysis, w/ Reflex to Culture (Infection Suspected) -Urine, Clean Catch  Once,   URGENT       Question:  Specimen Source  Answer:  Urine, Clean Catch   11/22/22 2144  Vitals/Pain Today's Vitals   11/22/22 2130 11/22/22 2147 11/22/22 2215 11/22/22 2230  BP: (!) 193/117 (!) 193/117 (!) 196/120 (!) 188/115  Pulse:  (!) 102    Resp: (!) 0 (!) 28 19 (!) 28  Temp:      TempSrc:      SpO2:  100%    Weight:      Height:        Isolation Precautions No active isolations  Medications Medications  clevidipine (CLEVIPREX) infusion 0.5 mg/mL (2 mg/hr Intravenous New Bag/Given 11/22/22 2350)  sulfamethoxazole-trimethoprim (BACTRIM) 400-80 MG per tablet 1 tablet (has no administration in time range)  atorvastatin (LIPITOR) tablet 10 mg (has no administration in time range)  labetalol (NORMODYNE) tablet 300 mg (has no administration in time range)  losartan (COZAAR) tablet 100 mg (has no administration  in time range)  spironolactone (ALDACTONE) tablet 50 mg (has no administration in time range)  buPROPion (WELLBUTRIN) tablet 225 mg (has no administration in time range)  Basaglar KwikPen KwikPen 7 Units (has no administration in time range)  pantoprazole (PROTONIX) EC tablet 40 mg (has no administration in time range)  folic acid (FOLVITE) tablet 1 mg (has no administration in time range)  mycophenolate (MYFORTIC) EC tablet 720 mg (has no administration in time range)  tacrolimus (PROGRAF) capsule 4 mg (has no administration in time range)  gabapentin (NEURONTIN) capsule 100 mg (has no administration in time range)  montelukast (SINGULAIR) tablet 10 mg (has no administration in time range)  docusate sodium (COLACE) capsule 100 mg (has no administration in time range)  polyethylene glycol (MIRALAX / GLYCOLAX) packet 17 g (has no administration in time range)  enoxaparin (LOVENOX) injection 40 mg (has no administration in time range)  acetaminophen (TYLENOL) tablet 650 mg (has no administration in time range)  insulin aspart (novoLOG) injection 0-5 Units (has no administration in time range)  insulin aspart (novoLOG) injection 0-15 Units (has no administration in time range)  predniSONE (DELTASONE) tablet 5 mg (has no administration in time range)  amLODipine (NORVASC) tablet 10 mg (has no administration in time range)  sodium chloride 0.9 % bolus 500 mL (500 mLs Intravenous New Bag/Given 11/22/22 2135)  labetalol (NORMODYNE) injection 10 mg (10 mg Intravenous Given 11/22/22 2236)    Mobility walks

## 2022-11-23 NOTE — Plan of Care (Signed)

## 2022-11-23 NOTE — Progress Notes (Signed)
eLink Physician-Brief Progress Note Patient Name: Linda Ellison DOB: Nov 07, 1975 MRN: [id number]   Date of Service  11/23/2022  HPI/Events of Note  47 year old woman history of ESRD now status post kidney transplant presents with headache and elevated blood pressure concerning for hypertensive emergency.   She initially presented hypertensive, tachycardic, and mildly tachypneic.  She has ongoing hyperglycemia.  Metabolic panel with mild metabolic acidosis CBC with chronic anemia.  CT head unremarkable.  eICU Interventions  Started on nicardipine as needed, home medications including addition of amlodipine  On mycophenolate, tacrolimus, and prednisone with PJP prophylaxis.  Sliding scale insulin and Lantus  DVT prophylaxis with enoxaparin     Intervention Category Evaluation Type: New Patient Evaluation  Kaylin Marcon 11/23/2022, 1:08 AM

## 2022-11-23 NOTE — Progress Notes (Signed)
[facility] ADULT ICU REPLACEMENT PROTOCOL   The patient does apply for the [facility] Adult ICU Electrolyte Replacment Protocol based on the criteria listed below:   1.Exclusion criteria: TCTS, ECMO, Dialysis, and Myasthenia Gravis patients 2. Is GFR >/= 30 ml/min? Yes.    Patient's GFR today is >60 3. Is SCr </= 2? Yes.   Patient's SCr is 0.77 mg/dL 4. Did SCr increase >/= 0.5 in 24 hours? No. 5.Pt's weight >40kg  Yes.   6. Abnormal electrolyte(s): K 3.0, Mag 1.3  7. Electrolytes replaced per protocol 8.  Call MD STAT for K+ </= 2.5, Phos </= 1, or Mag </= 1 Physician:    Markus Daft A 11/23/2022 3:53 AM

## 2022-11-23 NOTE — Plan of Care (Signed)
Discussed with patient plan of care for the evening, pain management and medications with some teach back displayed.  What is important to the patient is getting her blood pressure down and rest.  Problem: Education: Goal: Knowledge of General Education information will improve Description: Including pain rating scale, medication(s)/side effects and non-pharmacologic comfort measures Outcome: Progressing   Problem: Activity: Goal: Risk for activity intolerance will decrease Outcome: Not Progressing   Problem: Coping: Goal: Level of anxiety will decrease Outcome: Progressing   Problem: Pain Managment: Goal: General experience of comfort will improve Outcome: Progressing

## 2022-11-23 NOTE — Progress Notes (Signed)
Carotid artery duplex has been completed. Preliminary results can be found in CV Proc through chart review.   11/23/22 3:43 PM Olen Cordial RVT

## 2022-11-23 NOTE — Progress Notes (Addendum)
Patient sat up on the side of the bed and dangled legs.  Patient after a time stood up and we paused before ambulating to the bathroom with her walker.  Patient was able to make it to the bathroom to void without any complications.    After standing up and talking for a few minutes her eyes started rolling and she went almost catatonic.  Her eyes looked glazed over.   She was diaphoretic, legs had slight twitching more than tremoring on the commode and went lymph.  After verbal responses to her name were unmet, I performed a slight stern rub and tapping until she came out of the cationic like state.  It lasted about 2-3 minutes.  Patient placed in a wheel chair and moved back to bed.  ACTION PLAN:  To use the bedpan for voiding and bowel movements; obtain orthostatic vital signs sometime this morning; perform a 12 lead EKG and pause the Cleviprex since previous blood pressure SBP<150 for about 1-2 hours.  Cleviprex isn't compatible with Magnesium IVPB at this time.

## 2022-11-23 NOTE — Progress Notes (Signed)
Pt O2 sats 88% on RA. 2L [place] put on pt. Dr. Everardo All notified. Chest x ray ordered.

## 2022-11-23 NOTE — Progress Notes (Addendum)
NAME:  Linda Ellison, MRN:  [id number], DOB:  1976-02-07, LOS: 1 ADMISSION DATE:  11/22/2022, CONSULTATION DATE:  11/23/22  REFERRING MD:  ED, CHIEF COMPLAINT:  Headache   History of Present Illness:  47 year old woman history of ESRD now status post kidney transplant presents with headache and elevated blood pressure concerning for hypertensive emergency.  Notably, had a recent admission for over a month with pneumonia requiring intubation and acute renal failure requiring dialysis now recovered.  At home have presyncope or syncope.  Some headache.  Deny significant trauma or fall but says she was walking at the time.  Was brought to the ED.  Noted to have elevated blood pressures 180-190 systolic.  CT head negative.  Labs relatively reassuring, normal renal function etc.  Given labetalol IV with minimal effect.  Started on clevidipine.  Pertinent  Medical History  As discussed above.  Significant Hospital Events: Including procedures, antibiotic start and stop dates in addition to other pertinent events   8/19 admitted to the hospital with elevated blood pressure headache syncope/presyncope  Interim History / Subjective:  No distress.  Headache controlled.  Objective   Blood pressure (!) 175/88, pulse 87, temperature 97.6 F (36.4 C), temperature source Oral, resp. rate (!) 22, height 5\' 6"  (1.676 m), weight 93.4 kg, SpO2 100%.        Intake/Output Summary (Last 24 hours) at 11/23/2022 0744 Last data filed at 11/23/2022 0740 Gross per 24 hour  Intake 1154.93 ml  Output 400 ml  Net 754.93 ml   Filed Weights   11/22/22 1739 11/23/22 0102 11/23/22 0112  Weight: 109.8 kg 93.4 kg 93.4 kg    Examination: General 47 year old female patient resting in bed no acute distress HEENT normocephalic atraumatic no jugular venous distention Pulmonary: Clear to auscultation Cardiac: Regular rate and rhythm Abdomen: Soft nontender Extremities: Warm dry Neuro: Intact   Resolved  Hospital Problem list     Assessment & Plan:  Hypertensive emergency: Headache, possible syncope. Plan Wean Cleviprex SBP goal less than 180 Resume Normodyne, norvasc  (just added) and spironolactone (changed to bid)  this am   Orthostasis and Syncope: She reports passing out.  Lightheadedness.  Denies vertigo.  Possible orthostatic hypotension this was a issue last admit as well.  Plan Cont Telemetry F/u TEE Depending on TEE may need cards Compression stockings She had these symptoms during last admit as well. May need to tolerate higher BP  Headache: Poss related to hypertension.  No concerning findings for press etc.  CT head clear. Plan BP control as above PRN tylenol   Renal transplant: Plan Continue prednisone 5 mg daily, mycophenolate 720 twice daily, tacrolimus 4 mg twice daily Continue Bactrim for PJP prophylaxis Spoke w/ Nephro. No indication at this point for formal nephro-consult but they are happy to consult if questions or we see decline in renal fxn   Fluid and electrolyte imbalance: hypokalemia, hyponatremia Hypomagnesemia, mild NAG metabolic acidosis  Plan Replace  K Replace Mg Repeat chem am  Control BP  Correct hyperglycemia   Diabetes w/ hyperglycemia  Plan Increase Semglee to 9 units twice daily Cont sliding scale insulin  Chronic anemia  Plan Monitor   Best Practice (right click and "Reselect all SmartList Selections" daily)   Diet/type: Regular consistency (see orders) DVT prophylaxis: LMWH GI prophylaxis: N/A Lines: N/A Foley:  N/A Code Status:  full code Last date of multidisciplinary goals of care discussion [patient and family updated at bedside]  Critical care time: NA  Simonne Martinet ACNP-BC [facility] Pulmonary/Critical Care Pager # [phone] OR # [phone] if no answer

## 2022-11-23 NOTE — Inpatient Diabetes Management (Signed)
Inpatient Diabetes Program Recommendations  AACE/ADA: New Consensus Statement on Inpatient Glycemic Control (2015)  Target Ranges:  Prepandial:   less than 140 mg/dL      Peak postprandial:   less than 180 mg/dL (1-2 hours)      Critically ill patients:  140 - 180 mg/dL   Lab Results  Component Value Date   GLUCAP 225 (H) 11/23/2022   HGBA1C 6.7 (H) 08/27/2022    Review of Glycemic Control  Latest Reference Range & Units 11/22/22 17:52 11/23/22 01:01 11/23/22 04:05 11/23/22 08:25  Glucose-Capillary 70 - 99 mg/dL 098 (H) 119 (H) 147 (H) 225 (H)   Diabetes history: DM (S/P kidney transplant) Outpatient Diabetes medications:  Novolog 0-9 units tid with meals Basaglar 7 units bid Current orders for Inpatient glycemic control:  Novolog 0-15 units tid with meals and HS Semglee 9 units bid  Inpatient Diabetes Program Recommendations:    May consider reducing Novolog to sensitive (0-9 units) tid with meals and HS.  Also consider adding CHO mod. To diet.    Thanks,  Beryl Meager, RN, BC-ADM Inpatient Diabetes Coordinator Pager [phone]  (8a-5p)

## 2022-11-24 DIAGNOSIS — I161 Hypertensive emergency: Secondary | ICD-10-CM | POA: Diagnosis not present

## 2022-11-24 LAB — BASIC METABOLIC PANEL
Anion gap: 9 (ref 5–15)
BUN: 9 mg/dL (ref 6–20)
CO2: 18 mmol/L — ABNORMAL LOW (ref 22–32)
Calcium: 8.5 mg/dL — ABNORMAL LOW (ref 8.9–10.3)
Chloride: 107 mmol/L (ref 98–111)
Creatinine, Ser: 0.83 mg/dL (ref 0.44–1.00)
GFR, Estimated: >60 mL/min (ref >60)
Glucose, Bld: 278 mg/dL — ABNORMAL HIGH (ref 70–99)
Potassium: 3.9 mmol/L (ref 3.5–5.1)
Sodium: 134 mmol/L — ABNORMAL LOW (ref 135–145)

## 2022-11-24 LAB — GLUCOSE, CAPILLARY
Glucose-Capillary: 268 mg/dL — ABNORMAL HIGH (ref 70–99)
Glucose-Capillary: 196 mg/dL — ABNORMAL HIGH (ref 70–99)
Glucose-Capillary: 289 mg/dL — ABNORMAL HIGH (ref 70–99)
Glucose-Capillary: 195 mg/dL — ABNORMAL HIGH (ref 70–99)

## 2022-11-24 LAB — TRIGLYCERIDES: Triglycerides: 156 mg/dL — ABNORMAL HIGH (ref <150)

## 2022-11-24 MED ORDER — HYDRALAZINE HCL 25 MG PO TABS
25.0000 mg | ORAL_TABLET | Freq: Three times a day (TID) | ORAL | Status: DC
  Administered 2022-11-24 – 2022-11-25 (×4): 25 mg via ORAL
  Filled 2022-11-24 (×4): qty 1

## 2022-11-24 NOTE — Hospital Course (Addendum)
Brief hospital course: 47 year old female with ESRD s/p kidney transplant, DM2, GERD, HTN, asthma admitted for hypertensive emergency.  Required IV Cleviprex drip in the ED and therefore was admitted to the ICU.  Assessment and Plan: Hypertensive emergency. Recurrent admission. Presents with headache and syncope. Blood pressure stable. Currently on Norvasc.  Hydralazine added.  Aldactone dose increased.  Already on losartan and labetalol. Will monitor.  Recurrent orthostasis. Presented with syncope. Blood pressure dropped down significantly. Recommended TEE. Compression stockings. Will monitor.  Headache. Related to blood pressure Continue Tylenol.  History of renal transplant. On Prograf, Myfortic and tacrolimus. Tacrolimus toxicity cannot be ruled out. Tacrolimus also assisted with orthostatic hypotension. Check level. Continue Bactrim.  Hypokalemia, hypomagnesemia, hyponatremia. Non-anion gap metabolic acidosis. Monitor for now.  Anemia. Of chronic kidney disease. Monitor.  Obesity Body mass index is 33.38 kg/m.  Placing the pt at higher risk of poor outcomes.

## 2022-11-24 NOTE — Plan of Care (Signed)

## 2022-11-24 NOTE — Progress Notes (Deleted)
Pt stood by herself to the bsc after being educated on the dangers of getting up by herself. Pt reeducated about asking for help & not getting up by herself due to risk of falling & high requirements of O2. Pt's O2 in the 80s. Lips are a blue/purple hue. Pt told to take deep breaths. Pt continued to talk & said "my lips are blue at home." O2 remains in the 80s at 15L on [place]. Pt asked about clarification on diet. Explained to pt Dr. Everardo All put her on a clear liquid diet bc pt wanted to have food. Dr. Everardo All preferred pt to only have ice chips & sips with meds. Pt really wants food so compromised with clears & educated on the dangers of her breathing getting worse. Pt also reeducated on the needs for a PICC for CVP checks & able to draw labs. Pt said she understood & wants a PICC. Notified Dr. Everardo All about O2 being in the 80s. Ordered HHFNC. Called respiratory team to place [facility] in pt's room.

## 2022-11-24 NOTE — Inpatient Diabetes Management (Signed)
Inpatient Diabetes Program Recommendations  AACE/ADA: New Consensus Statement on Inpatient Glycemic Control (2015)  Target Ranges:  Prepandial:   less than 140 mg/dL      Peak postprandial:   less than 180 mg/dL (1-2 hours)      Critically ill patients:  140 - 180 mg/dL   Lab Results  Component Value Date   GLUCAP 268 (H) 11/24/2022   HGBA1C 6.7 (H) 08/27/2022    Review of Glycemic Control:  Latest Reference Range & Units 11/23/22 16:25 11/23/22 21:56 11/24/22 07:36  Glucose-Capillary 70 - 99 mg/dL 213 (H) 086 (H) 578 (H)   Diabetes history: DM (S/P kidney transplant) Outpatient Diabetes medications:  Novolog 0-9 units tid with meals Basaglar 7 units bid Current orders for Inpatient glycemic control:  Novolog 0-15 units tid with meals and HS Semglee 9 units bid Prednisone 5 mg daily   Inpatient Diabetes Program Recommendations:    Consider increasing Semglee to 12 units bid.  May need low dose meal coverage as well.    Thanks,  Beryl Meager, RN, BC-ADM Inpatient Diabetes Coordinator Pager [phone]  (8a-5p)

## 2022-11-24 NOTE — Progress Notes (Signed)
Report given, Pt stable at time of transfer. [place] RN notified of pt's concern for refilling her immunosuppression medication for her kidney transplant due to costs.

## 2022-11-24 NOTE — Progress Notes (Signed)
[facility] Progress Note Patient: Linda Ellison UEA:[id number] DOB: Jun 20, 1975 DOA: 11/22/2022  DOS: the patient was seen and examined on 11/24/2022  Brief hospital course: 47 year old female with ESRD s/p kidney transplant, DM2, GERD, HTN, asthma admitted for hypertensive emergency.  Required IV Cleviprex drip in the ED and therefore was admitted to the ICU.  Assessment and Plan: Hypertensive emergency. Recurrent admission. Presents with headache and syncope. Blood pressure stable. Currently on Norvasc.  Hydralazine added.  Aldactone dose increased.  Already on losartan and labetalol. Will monitor.  Recurrent orthostasis. Presented with syncope. Blood pressure dropped down significantly. Recommended TEE. Compression stockings. Will monitor.  Headache. Related to blood pressure Continue Tylenol.  History of renal transplant. On Prograf, Myfortic and tacrolimus. Tacrolimus toxicity cannot be ruled out. Tacrolimus also assisted with orthostatic hypotension. Check level. Continue Bactrim.  Hypokalemia, hypomagnesemia, hyponatremia. Non-anion gap metabolic acidosis. Monitor for now.  Anemia. Of chronic kidney disease. Monitor.  Obesity Body mass index is 33.38 kg/m.  Placing the pt at higher risk of poor outcomes.    Subjective: No nausea or vomiting.  Still has headache.  Still has some dizziness.  Physical Exam: General: in Mild distress, No Rash Cardiovascular: S1 and S2 Present, No Murmur Respiratory: Good respiratory effort, Bilateral Air entry present. No Crackles, No wheezes Abdomen: Bowel Sound present, No tenderness Extremities: No edema Neuro: Alert and oriented x3, no new focal deficit  Data Reviewed: I have Reviewed nursing notes, Vitals, and Lab results. Since last encounter, pertinent lab results CBC and BMP   . I have ordered test including CBC BMP tacrolimus level  . I have discussed pt's care plan and test results with nephrology  .    Disposition: Status is: Inpatient Remains inpatient appropriate because: Monitor for blood pressure stability  enoxaparin (LOVENOX) injection 40 mg Start: 11/23/22 1000   Family Communication: Family at bedside Level of care: Telemetry   Vitals:   11/24/22 1100 11/24/22 1200 11/24/22 1329 11/24/22 1653  BP: (!) 170/98 (!) 165/100 132/86 (!) 160/98  Pulse: 90 92 88 96  Resp: 18 18    Temp:   98.2 F (36.8 C) 98 F (36.7 C)  TempSrc:   Oral Oral  SpO2: 93% 93% 97% 97%  Weight:      Height:         Author: Lynden Oxford, MD 11/24/2022 5:27 PM  Please look on [email or web address] to find out who is on call.

## 2022-11-25 DIAGNOSIS — I161 Hypertensive emergency: Secondary | ICD-10-CM | POA: Diagnosis not present

## 2022-11-25 LAB — GLUCOSE, CAPILLARY
Glucose-Capillary: 211 mg/dL — ABNORMAL HIGH (ref 70–99)
Glucose-Capillary: 126 mg/dL — ABNORMAL HIGH (ref 70–99)
Glucose-Capillary: 225 mg/dL — ABNORMAL HIGH (ref 70–99)
Glucose-Capillary: 200 mg/dL — ABNORMAL HIGH (ref 70–99)

## 2022-11-25 MED ORDER — AMLODIPINE BESYLATE 5 MG PO TABS: 5.0000 mg | ORAL_TABLET | Freq: Every day | ORAL | Status: DC

## 2022-11-25 NOTE — Progress Notes (Signed)
[facility] Progress Note Patient: Linda Ellison ZOX:[id number] DOB: [birth year] DOA: 11/22/2022  DOS: the patient was seen and examined on 11/25/2022  Brief hospital course: 47 year old female with ESRD s/p kidney transplant, DM2, GERD, HTN, asthma admitted for hypertensive emergency.  Required IV Cleviprex drip in the ED and therefore was admitted to the ICU.  Assessment and Plan: Hypertensive emergency. Recurrent admission. Presents with headache and syncope. Blood pressure improved significantly although drops to 80s and 90s from 150s. Will be holding Norvasc and hydralazine. Continue Aldactone and losartan and labetalol.  Recurrent orthostasis. Presented with syncope. Blood pressure dropped down significantly. Carotid Doppler negative. Echocardiogram shows 65 to 70% EF, no significant aortic regurgitation or other valvular abnormality. Will add compression stockings as well as abdominal binder.  Hold amlodipine and hydralazine and monitor blood pressure changes.  Headache. Related to blood pressure Continue Tylenol.  History of renal transplant. On Prograf, Myfortic and tacrolimus. Tacrolimus toxicity cannot be ruled out. Tacrolimus also assisted with orthostatic hypotension. Check level.  Currently pending.  Recommend outpatient follow-up with transplant team. Continue Bactrim.  Hypokalemia, hypomagnesemia, hyponatremia. Non-anion gap metabolic acidosis. Monitor for now.  Anemia. Of chronic kidney disease. Monitor.  Obesity Body mass index is 33.38 kg/m.  Placing the pt at higher risk of poor outcomes.    Subjective: No nausea or vomiting no fever no chills.  Blood pressure dropped significantly from 150 to 70s  Physical Exam: General: in Mild distress, No Rash Cardiovascular: S1 and S2 Present, No Murmur Respiratory: Good respiratory effort, Bilateral Air entry present. No Crackles, No wheezes Abdomen: Bowel Sound present, No tenderness Extremities: No  edema Neuro: Alert and oriented x3, no new focal deficit  Data Reviewed: I have Reviewed nursing notes, Vitals, and Lab results. Since last encounter, pertinent lab results CBC and BMP   . I have ordered test including CBC and BMP  .   Disposition: Status is: Inpatient Remains inpatient appropriate because: Monitor for improvement in blood pressure  enoxaparin (LOVENOX) injection 40 mg Start: 11/23/22 1000   Family Communication: No one at bedside, family on the phone Level of care: Telemetry   Vitals:   11/24/22 2129 11/25/22 0121 11/25/22 0454 11/25/22 0700  BP: (!) 162/94 (!) 158/90 (!) 153/92   Pulse: 99 100 94   Resp: 18 18 18    Temp: 98.1 F (36.7 C) 98.4 F (36.9 C) 98.4 F (36.9 C)   TempSrc: Oral Oral Oral   SpO2: 93% 91% 94%   Weight:    95.3 kg  Height:         Author: Lynden Oxford, MD 11/25/2022 5:08 PM  Please look on [email or web address] to find out who is on call.

## 2022-11-25 NOTE — Inpatient Diabetes Management (Signed)
Inpatient Diabetes Program Recommendations  AACE/ADA: New Consensus Statement on Inpatient Glycemic Control (2015)  Target Ranges:  Prepandial:   less than 140 mg/dL      Peak postprandial:   less than 180 mg/dL (1-2 hours)      Critically ill patients:  140 - 180 mg/dL    Latest Reference Range & Units 11/24/22 07:36 11/24/22 11:21 11/24/22 16:51 11/24/22 21:25  Glucose-Capillary 70 - 99 mg/dL 284 (H)  8 units Novolog  196 (H)  3 units Novolog  9 units Semglee  289 (H)  8 units Novolog  195 (H)     9 units Semglee  (H): Data is abnormally high  Latest Reference Range & Units 11/25/22 07:29  Glucose-Capillary 70 - 99 mg/dL 132 (H)  5 units Novolog   (H): Data is abnormally high    Home DM Meds: Novolog 0-9 units tid with meals      Basaglar 7 units bid      Trulicity 4.5 mg Qweek  Current Orders: Semglee 9 units BID      Novolog Moderate Correction Scale/ SSI (0-15 units) TID AC + HS    MD- Please consider:  1. Increase Semglee slightly to 11 units BID  2. Start Novolog Meal Coverage: Novolog 6 units TID with meals HOLD if pt NPO HOLD if pt eats <50% meals    --Will follow patient during hospitalization--  Ambrose Finland RN, MSN, CDCES Diabetes Coordinator Inpatient Glycemic Control Team Team Pager: [phone] (8a-5p)

## 2022-11-25 NOTE — Plan of Care (Signed)
  Problem: Clinical Measurements: Goal: Ability to maintain clinical measurements within normal limits will improve Outcome: Progressing Goal: Diagnostic test results will improve Outcome: Progressing Goal: Cardiovascular complication will be avoided Outcome: Progressing   Problem: Activity: Goal: Risk for activity intolerance will decrease Outcome: Progressing   Problem: Safety: Goal: Ability to remain free from injury will improve Outcome: Progressing   

## 2022-11-26 DIAGNOSIS — I161 Hypertensive emergency: Secondary | ICD-10-CM | POA: Diagnosis not present

## 2022-11-26 LAB — GLUCOSE, CAPILLARY
Glucose-Capillary: 163 mg/dL — ABNORMAL HIGH (ref 70–99)
Glucose-Capillary: 142 mg/dL — ABNORMAL HIGH (ref 70–99)

## 2022-11-26 LAB — TACROLIMUS LEVEL: Tacrolimus (FK506) - LabCorp: 5.9 ng/mL (ref 2.0–20.0)

## 2022-11-26 MED ORDER — SPIRONOLACTONE 50 MG PO TABS: 50.0000 mg | ORAL_TABLET | Freq: Two times a day (BID) | ORAL | 1 refills | Status: DC

## 2022-11-26 NOTE — Plan of Care (Signed)
  Problem: Clinical Measurements: Goal: Ability to maintain clinical measurements within normal limits will improve Outcome: Progressing Goal: Respiratory complications will improve Outcome: Progressing Goal: Cardiovascular complication will be avoided Outcome: Progressing   

## 2022-11-28 LAB — CULTURE, BLOOD (ROUTINE X 2)
Culture: NO GROWTH
Culture: NO GROWTH
Special Requests: ADEQUATE
Special Requests: ADEQUATE

## 2022-12-01 NOTE — Discharge Summary (Signed)
Physician Discharge Summary   Patient: Linda Ellison MRN: [id number] DOB: 21-Mar-1976  Admit date:     11/22/2022  Discharge date: 11/26/2022  Discharge Physician: Lynden Oxford  PCP: Daylene Katayama, PA  Recommendations at discharge:  Follow up with PCP    Follow-up Information     Care, [facility] Follow up.   Specialty: Home Health Services Why: [facility] will provide PT, OT, RN, social work, and an aide in the home after discharge. Contact information: [address] [place] [address] [phone]         Daylene Katayama, [place]. Schedule an appointment as soon as possible for a visit in 1 week(s).   Specialty: Family Medicine Why: with BMP lab to look at kidney and electrolytes Contact information: [address] [place] [address] [phone]                Discharge Diagnoses: Principal Problem:   Hypertensive emergency  Brief hospital course: 47 year old female with ESRD s/p kidney transplant, DM2, GERD, HTN, asthma admitted for hypertensive emergency.  Required IV Cleviprex drip in the ED and therefore was admitted to the ICU.  Assessment and Plan: Hypertensive emergency. Recurrent admission. Presents with headache and syncope. Blood pressure improved significantly although drops to 80s and 90s from 150s. Continue Aldactone and losartan and labetalol.  Recurrent orthostasis. Presented with syncope. Blood pressure dropped down significantly. Carotid Doppler negative. Echocardiogram shows 65 to 70% EF, no significant aortic regurgitation or other valvular abnormality. Will add compression stockings as well as abdominal binder.  Allow higher supine blood pressure.   Headache. Related to blood pressure Continue Tylenol.  History of renal transplant. On Prograf, Myfortic and tacrolimus. Tacrolimus toxicity cannot be ruled out. Tacrolimus also assisted with orthostatic hypotension. Level adequate  Recommend  outpatient follow-up with transplant team. Continue Bactrim.  Hypokalemia, hypomagnesemia, hyponatremia. Non-anion gap metabolic acidosis. Monitor for now.  Anemia. Of chronic kidney disease. Monitor.  Obesity Body mass index is 33.38 kg/m.  Placing the pt at higher risk of poor outcomes.   Consultants:  PCCM   Procedures performed:  none  DISCHARGE MEDICATION: Allergies as of 11/26/2022       Reactions   Ciprofloxacin Hives, Itching, Nausea And Vomiting   Fluocinolone Itching, Nausea And Vomiting, Other (See Comments)   Hydromorphone Hives   Strawberry Extract Itching, Other (See Comments)   Pt states that this medication makes her tongue raw.   No reaction   Hydromorphone Hcl Hives   Phenytoin Sodium Extended Itching   Adhesive [tape] Itching, Rash   Chlorhexidine Gluconate Itching   Clindamycin Diarrhea, Nausea And Vomiting, Rash   Pork-derived Products Rash   Patient dosent eat pork   Shrimp [shellfish Allergy] Rash        Medication List     TAKE these medications    acetaminophen 325 MG tablet Commonly known as: TYLENOL Take 2 tablets (650 mg total) by mouth every 6 (six) hours as needed for mild pain, fever or headache.   albuterol 108 (90 Base) MCG/ACT inhaler Commonly known as: VENTOLIN HFA Inhale 2 puffs into the lungs every 6 (six) hours as needed for wheezing or shortness of breath.   atorvastatin 10 MG tablet Commonly known as: LIPITOR Take 1 tablet (10 mg total) by mouth daily.   Basaglar KwikPen 100 UNIT/ML Inject 7 Units into the skin 2 (two) times daily.   BD Pen Needle Nano U/F 32G X 4 MM Misc Generic drug:  Insulin Pen Needle Use 3 (three) times daily.   buPROPion 75 MG tablet Commonly known as: WELLBUTRIN Take 3 tablets (225 mg total) by mouth 2 (two) times daily.   Dulaglutide 4.5 MG/0.5ML Sopn Inject 4.5 mg into the skin once a week. Every Tuesday   folic acid 1 MG tablet Commonly known as: FOLVITE Take 1 tablet (1 mg  total) by mouth daily.   gabapentin 100 MG capsule Commonly known as: NEURONTIN Take 1 capsule (100 mg total) by mouth at bedtime.   labetalol 300 MG tablet Commonly known as: NORMODYNE Take 1 tablet (300 mg total) by mouth 2 (two) times daily.   Lancet Device Misc 1 each by Does not apply route 3 (three) times daily. May dispense any manufacturer covered by patient's insurance.   losartan 100 MG tablet Commonly known as: COZAAR Take 1 tablet (100 mg total) by mouth daily.   magnesium oxide 400 MG tablet Commonly known as: MAG-OX Take 1 tablet (400 mg total) by mouth daily.   montelukast 10 MG tablet Commonly known as: SINGULAIR Take 10 mg by mouth at bedtime.   mycophenolate 180 MG EC tablet Commonly known as: MYFORTIC Take 4 tablets (720 mg total) by mouth 2 (two) times daily. Follow with your kidney doctor for refills   naloxone 4 MG/0.1ML Liqd nasal spray kit Commonly known as: NARCAN Place 1 spray into the nose as needed.   NovoLOG FlexPen 100 UNIT/ML FlexPen Generic drug: insulin aspart Inject 0-9 Units into the skin 3 (three) times daily with meals. CBG < 70: treat for low blood sugar CBG 121 - 150: 0 units           CBG 151 - 200: 1 unit CBG 201 - 250: 2 units           CBG 251 - 300: 3 units  CBG 301 - 350: 5 units           CBG 351 - 400: 7 units CBG > 400: 9 units and call MD   ondansetron 4 MG disintegrating tablet Commonly known as: ZOFRAN-ODT Take 4 mg by mouth every 8 (eight) hours as needed for vomiting or nausea.   OneTouch Verio Flex System w/Device Kit Use 3 (three) times daily.   OneTouch Verio test strip Generic drug: glucose blood Use as directed to check blood sugar 3 (three) times daily.   Oxycodone HCl 10 MG Tabs Take 1 tablet by mouth every 4 (four) hours as needed (pain).   pantoprazole 40 MG tablet Commonly known as: PROTONIX Take 1 tablet (40 mg total) by mouth daily. For acid reflux   predniSONE 5 MG tablet Commonly known as:  DELTASONE Take 1 tablet (5 mg total) by mouth daily with breakfast. Follow with your kidney doctor for refills   spironolactone 50 MG tablet Commonly known as: ALDACTONE Take 1 tablet (50 mg total) by mouth 2 (two) times daily. What changed: when to take this   sulfamethoxazole-trimethoprim 400-80 MG tablet Commonly known as: BACTRIM Take 1 tablet by mouth every Monday, Wednesday, and Friday. For infection   tacrolimus 1 MG capsule Commonly known as: Prograf Take 4 capsules (4 mg total) by mouth 2 (two) times daily. Follow with your kidney doctor for refills       Disposition: Home Diet recommendation: Cardiac diet  Discharge Exam: Vitals:   11/25/22 2357 11/26/22 0300 11/26/22 0630 11/26/22 0700  BP: (!) 169/94 (!) 152/96 (!) 165/98   Pulse: 97  92   Resp:  18   Temp:   97.8 F (36.6 C)   TempSrc:   Oral   SpO2:   91%   Weight:    95.7 kg  Height:       General: Appear in no distress; no visible Abnormal Neck Mass Or lumps, Conjunctiva normal Cardiovascular: S1 and S2 Present, no Murmur, Respiratory: good respiratory effort, Bilateral Air entry present and CTA, no Crackles, no wheezes Abdomen: Bowel Sound present, Non tender  Extremities: no Pedal edema Neurology: alert and oriented to time, place, and person  Filed Weights   11/24/22 0500 11/25/22 0700 11/26/22 0700  Weight: 93.8 kg 95.3 kg 95.7 kg   Condition at discharge: stable  The results of significant diagnostics from this hospitalization (including imaging, microbiology, ancillary and laboratory) are listed below for reference.   Imaging Studies: VAS US CAROTID  Result Date: 11/24/2022 Carotid Arterial Duplex Study Patient Name:  Linda Ellison  Date of Exam:   11/23/2022 Medical Rec #: [id number]       Accession #:    [id number] Date of Birth: June 24, 1975       Patient Gender: F Patient Age:   25 years Exam Location:  [facility] Procedure:      VAS US CAROTID Referring Phys: Zenia Resides  --------------------------------------------------------------------------------  Indications:       Syncope. Risk Factors:      Hypertension, Diabetes, coronary artery disease. Comparison Study:  No prior studies. Performing Technologist: Chanda Busing RVT  Examination Guidelines: A complete evaluation includes B-mode imaging, spectral Doppler, color Doppler, and power Doppler as needed of all accessible portions of each vessel. Bilateral testing is considered an integral part of a complete examination. Limited examinations for reoccurring indications may be performed as noted.  Right Carotid Findings: +----------+--------+--------+--------+-----------------------+--------+           PSV cm/sEDV cm/sStenosisPlaque Description     Comments +----------+--------+--------+--------+-----------------------+--------+ CCA Prox  112     14              smooth and heterogenous         +----------+--------+--------+--------+-----------------------+--------+ CCA Distal72      12              smooth and heterogenous         +----------+--------+--------+--------+-----------------------+--------+ ICA Prox  59      16              smooth and heterogenous         +----------+--------+--------+--------+-----------------------+--------+ ICA Mid   55      17                                              +----------+--------+--------+--------+-----------------------+--------+ ICA Distal58      17                                              +----------+--------+--------+--------+-----------------------+--------+ ECA       72      4                                               +----------+--------+--------+--------+-----------------------+--------+ +----------+--------+-------+--------+-------------------+  PSV cm/sEDV cmsDescribeArm Pressure (mmHG) +----------+--------+-------+--------+-------------------+ Subclavian119                                         +----------+--------+-------+--------+-------------------+ +---------+--------+--+--------+--+---------+ VertebralPSV cm/s44EDV cm/s14Antegrade +---------+--------+--+--------+--+---------+  Left Carotid Findings: +----------+--------+--------+--------+-----------------------+--------+           PSV cm/sEDV cm/sStenosisPlaque Description     Comments +----------+--------+--------+--------+-----------------------+--------+ CCA Prox  110     10              smooth and heterogenous         +----------+--------+--------+--------+-----------------------+--------+ CCA Distal71      10              smooth and heterogenous         +----------+--------+--------+--------+-----------------------+--------+ ICA Prox  45      13              smooth and heterogenous         +----------+--------+--------+--------+-----------------------+--------+ ICA Mid   67      18                                              +----------+--------+--------+--------+-----------------------+--------+ ICA Distal67      19                                              +----------+--------+--------+--------+-----------------------+--------+ ECA       62      5                                               +----------+--------+--------+--------+-----------------------+--------+ +----------+--------+--------+--------+-------------------+           PSV cm/sEDV cm/sDescribeArm Pressure (mmHG) +----------+--------+--------+--------+-------------------+ [id number]                                         +----------+--------+--------+--------+-------------------+ +---------+--------+--+--------+-+---------+ VertebralPSV cm/s59EDV cm/s9Antegrade +---------+--------+--+--------+-+---------+   Summary: Right Carotid: Velocities in the right ICA are consistent with a 1-39% stenosis. Left Carotid: Velocities in the left ICA are consistent with a 1-39% stenosis. Vertebrals: Bilateral  vertebral arteries demonstrate antegrade flow. *See table(s) above for measurements and observations.  Electronically signed by Waverly Ferrari MD on 11/24/2022 at 6:25:28 AM.    Final    DG Chest Port 1 View  Result Date: 11/23/2022 CLINICAL DATA:  Hypoxia EXAM: PORTABLE CHEST 1 VIEW COMPARISON:  11/22/2022 FINDINGS: The heart size and mediastinal contours are within normal limits. Both lungs are clear. The visualized skeletal structures are unremarkable. IMPRESSION: No active disease. Electronically Signed   By: Charlett Nose M.D.   On: 11/23/2022 17:30   DG Chest Port 1 View  Result Date: 11/22/2022 CLINICAL DATA:  Syncope. EXAM: PORTABLE CHEST 1 VIEW COMPARISON:  November 08, 2022 FINDINGS: The heart size and mediastinal contours are within normal limits. Both lungs are clear. The visualized skeletal structures are unremarkable. IMPRESSION: No active disease. Electronically Signed   By: Demetrius Revel.D.  On: 11/22/2022 22:49   CT Head Wo Contrast  Result Date: 11/22/2022 CLINICAL DATA:  Syncopal episode EXAM: CT HEAD WITHOUT CONTRAST TECHNIQUE: Contiguous axial images were obtained from the base of the skull through the vertex without intravenous contrast. RADIATION DOSE REDUCTION: This exam was performed according to the departmental dose-optimization program which includes automated exposure control, adjustment of the mA and/or kV according to patient size and/or use of iterative reconstruction technique. COMPARISON:  11/09/2022 FINDINGS: Brain: No evidence of acute infarction, hemorrhage, mass, mass effect, or midline shift. No hydrocephalus or extra-axial fluid collection. Partial empty sella. Normal cerebral volume for age. Vascular: No hyperdense vessel. Skull: Negative for fracture or focal lesion. Sinuses/Orbits: No acute finding. Other: The mastoid air cells are well aerated. IMPRESSION: No acute intracranial process. Electronically Signed   By: Wiliam Ke M.D.   On: 11/22/2022  22:39   CT HEAD WO CONTRAST ( )  Result Date: 11/09/2022 CLINICAL DATA:  Delirium EXAM: CT HEAD WITHOUT CONTRAST TECHNIQUE: Contiguous axial images were obtained from the base of the skull through the vertex without intravenous contrast. RADIATION DOSE REDUCTION: This exam was performed according to the departmental dose-optimization program which includes automated exposure control, adjustment of the mA and/or kV according to patient size and/or use of iterative reconstruction technique. COMPARISON:  08/09/2015 FINDINGS: Brain: No acute intracranial abnormality. Specifically, no hemorrhage, hydrocephalus, mass lesion, acute infarction, or significant intracranial injury. Vascular: No hyperdense vessel or unexpected calcification. Skull: No acute calvarial abnormality. Sinuses/Orbits: No acute findings Other: None IMPRESSION: Normal study. Electronically Signed   By: Charlett Nose M.D.   On: 11/09/2022 21:30   DG Chest Port 1 View  Result Date: 11/08/2022 CLINICAL DATA:  Cough, chest pain EXAM: PORTABLE CHEST 1 VIEW COMPARISON:  Previous studies including the examination of 10/27/2022 FINDINGS: Transverse diameter of heart is increased. There is poor inspiration. There is interval improvement in aeration in both lungs. There are no signs of alveolar pulmonary edema or focal pulmonary consolidation. There is slight prominence of markings in the lower lung fields. There is no pleural effusion or pneumothorax. IMPRESSION: Subtle increase in markings in the lower lung fields may suggest crowding of normal bronchovascular structures due to poor inspiration or early interstitial pneumonia. There is no focal pulmonary consolidation. There is no pleural effusion or pneumothorax. Electronically Signed   By: Ernie Avena M.D.   On: 11/08/2022 16:54    Microbiology: Results for orders placed or performed during the hospital encounter of 11/22/22  Resp panel by RT-PCR (RSV, Flu A&B, Covid) Anterior Nasal Swab      Status: None   Collection Time: 11/22/22 10:36 PM   Specimen: Anterior Nasal Swab  Result Value Ref Range Status   SARS Coronavirus 2 by RT PCR NEGATIVE NEGATIVE Final    Comment: (NOTE) SARS-CoV-2 target nucleic acids are NOT DETECTED.  The SARS-CoV-2 RNA is generally detectable in upper respiratory specimens during the acute phase of infection. The lowest concentration of SARS-CoV-2 viral copies this assay can detect is 138 copies/mL. A negative result does not preclude SARS-Cov-2 infection and should not be used as the sole basis for treatment or other patient management decisions. A negative result may occur with  improper specimen collection/handling, submission of specimen other than nasopharyngeal swab, presence of viral mutation(s) within the areas targeted by this assay, and inadequate number of viral copies(<138 copies/mL). A negative result must be combined with clinical observations, patient history, and epidemiological information. The expected result is Negative.  Fact Sheet for  Patients:  BloggerCourse.com  Fact Sheet for Healthcare Providers:  SeriousBroker.it  This test is no t yet approved or cleared by the [place] FDA and  has been authorized for detection and/or diagnosis of SARS-CoV-2 by FDA under an Emergency Use Authorization (EUA). This EUA will remain  in effect (meaning this test can be used) for the duration of the COVID-19 declaration under Section 564(b)(1) of the Act, 21 U.S.C.section 360bbb-3(b)(1), unless the authorization is terminated  or revoked sooner.       Influenza A by PCR NEGATIVE NEGATIVE Final   Influenza B by PCR NEGATIVE NEGATIVE Final    Comment: (NOTE) The Xpert Xpress SARS-CoV-2/FLU/RSV plus assay is intended as an aid in the diagnosis of influenza from Nasopharyngeal swab specimens and should not be used as a sole basis for treatment. Nasal washings and aspirates are  unacceptable for Xpert Xpress SARS-CoV-2/FLU/RSV testing.  Fact Sheet for Patients: BloggerCourse.com  Fact Sheet for Healthcare Providers: SeriousBroker.it  This test is not yet approved or cleared by the [place] FDA and has been authorized for detection and/or diagnosis of SARS-CoV-2 by FDA under an Emergency Use Authorization (EUA). This EUA will remain in effect (meaning this test can be used) for the duration of the COVID-19 declaration under Section 564(b)(1) of the Act, 21 U.S.C. section 360bbb-3(b)(1), unless the authorization is terminated or revoked.     Resp Syncytial Virus by PCR NEGATIVE NEGATIVE Final    Comment: (NOTE) Fact Sheet for Patients: BloggerCourse.com  Fact Sheet for Healthcare Providers: SeriousBroker.it  This test is not yet approved or cleared by the [place] FDA and has been authorized for detection and/or diagnosis of SARS-CoV-2 by FDA under an Emergency Use Authorization (EUA). This EUA will remain in effect (meaning this test can be used) for the duration of the COVID-19 declaration under Section 564(b)(1) of the Act, 21 U.S.C. section 360bbb-3(b)(1), unless the authorization is terminated or revoked.  Performed at [facility], [address], [place], [place] [address]   Culture, blood (routine x 2)     Status: None   Collection Time: 11/22/22 11:08 PM   Specimen: BLOOD  Result Value Ref Range Status   Specimen Description   Final    BLOOD BLOOD RIGHT ARM Performed at [facility], [address], [place], [place] [address]    Special Requests   Final    BOTTLES DRAWN AEROBIC AND ANAEROBIC Blood Culture adequate volume Performed at [facility], [address] [place], [place] [address]    Culture   Final    NO GROWTH 5 DAYS Performed at [facility], [address] [place], [place] [address]    Report Status 11/28/2022 FINAL  Final  MRSA Next Gen by PCR, Nasal     Status: None   Collection Time: 11/23/22  1:10 AM   Specimen: Nasal Mucosa; Nasal Swab  Result Value Ref Range Status   MRSA by PCR Next Gen NOT DETECTED NOT DETECTED Final    Comment: (NOTE) The GeneXpert MRSA Assay (FDA approved for NASAL specimens only), is one component of a comprehensive MRSA colonization surveillance program. It is not intended to diagnose MRSA infection nor to guide or monitor treatment for MRSA infections. Test performance is not FDA approved in patients less than 57 years old. Performed at [facility], [address] [place], [place] [address]   Culture, blood (routine x 2)     Status: None  Collection Time: 11/23/22  2:54 AM   Specimen: BLOOD  Result Value Ref Range Status   Specimen Description   Final    BLOOD BLOOD RIGHT HAND Performed at [facility], [address] [place], [place] [address]    Special Requests   Final    BOTTLES DRAWN AEROBIC AND ANAEROBIC Blood Culture adequate volume Performed at [facility], [address] [place], [place] [address]    Culture   Final    NO GROWTH 5 DAYS Performed at [facility], [address] [place], [place] [address]    Report Status 11/28/2022 FINAL  Final   Labs: CBC: No results for input(s): "WBC", "NEUTROABS", "HGB", "HCT", "MCV", "PLT" in the last 168 hours. Basic Metabolic Panel: Recent Labs  Lab 11/24/22 0737  NA 134*  K 3.9  CL 107  CO2 18*  GLUCOSE 278*  BUN 9  CREATININE 0.83  CALCIUM 8.5*   Liver Function Tests: No results for input(s): "AST", "ALT", "ALKPHOS", "BILITOT", "PROT", "ALBUMIN" in the last 168 hours. CBG: Recent Labs  Lab 11/25/22 1125 11/25/22 1639 11/25/22 2155 11/26/22 0744 11/26/22 1140  GLUCAP 126* 225* 200* 163* 142*    Discharge time spent: greater than 30  minutes.  Author: Lynden Oxford, MD  [facility] 11/26/2022

## 2022-12-02 ENCOUNTER — Emergency Department (HOSPITAL_COMMUNITY)
Admission: EM | Admit: 2022-12-02 | Discharge: 2022-12-05 | Disposition: E | Payer: Medicare (Managed Care) | Attending: Emergency Medicine | Admitting: Emergency Medicine

## 2022-12-02 DIAGNOSIS — I469 Cardiac arrest, cause unspecified: Secondary | ICD-10-CM | POA: Insufficient documentation

## 2022-12-02 DIAGNOSIS — R569 Unspecified convulsions: Secondary | ICD-10-CM | POA: Diagnosis not present

## 2022-12-02 DIAGNOSIS — Z794 Long term (current) use of insulin: Secondary | ICD-10-CM | POA: Diagnosis not present

## 2022-12-02 LAB — I-STAT CHEM 8, ED
BUN: 10 mg/dL (ref 6–20)
Calcium, Ion: 1.48 mmol/L — ABNORMAL HIGH (ref 1.15–1.40)
Chloride: 106 mmol/L (ref 98–111)
Creatinine, Ser: 1 mg/dL (ref 0.44–1.00)
Glucose, Bld: 517 mg/dL (ref 70–99)
HCT: 32 % — ABNORMAL LOW (ref 36.0–46.0)
Hemoglobin: 10.9 g/dL — ABNORMAL LOW (ref 12.0–15.0)
Potassium: 5.6 mmol/L — ABNORMAL HIGH (ref 3.5–5.1)
Sodium: 144 mmol/L (ref 135–145)
TCO2: 24 mmol/L (ref 22–32)

## 2022-12-02 LAB — CBG MONITORING, ED: Glucose-Capillary: 308 mg/dL — ABNORMAL HIGH (ref 70–99)

## 2022-12-05 NOTE — ED Notes (Signed)
1121- CPR started  1122- epi given  1123- pulse checked- asystole- resumed compressions  1124- levo started at 30 mcgs  1125- epi given  1125- pulse check- asystole- compressions started  1127- pulse check- compressions started  1127- calcium given  1128- epi given  1129- pulse check  1130- pt in vtach  1131- amnio started  1132- bicarb given  1133- CPR started  1134- epi given  1136- pulse check- asystole- compressions started  1137- epi given  1138- pulse check/ calcium given/ CPR started  1139- bicarb given

## 2022-12-05 NOTE — ED Provider Notes (Signed)
[place] EMERGENCY DEPARTMENT AT [facility] Provider Note   CSN: [id number] Arrival date & time: 11/12/2022  1103     History  No chief complaint on file.   Linda Ellison is a 47 y.o. female.  47 yo F with a chief complaint of cardiac arrest.  Per EMS the patient was recently in the hospital and she had experienced some seizure activity.  Was scheduled to follow-up with neurology in the office but had a seizure last night and then again this morning and so EMS was called.  When they arrived they got her onto the stretcher she was awake and talking to them and she had another seizure-like event but quickly afterwards was noted to be pulseless and apneic.  CPR was started in the scene.  An i-gel was placed.  CPR ongoing for about 40 minutes.  8 doses of epinephrine.        Home Medications Prior to Admission medications   Medication Sig Start Date End Date Taking? Authorizing Provider  acetaminophen (TYLENOL) 325 MG tablet Take 2 tablets (650 mg total) by mouth every 6 (six) hours as needed for mild pain, fever or headache. 11/13/22   Zigmund Daniel., MD  albuterol (PROVENTIL HFA;VENTOLIN HFA) 108 (90 Base) MCG/ACT inhaler Inhale 2 puffs into the lungs every 6 (six) hours as needed for wheezing or shortness of breath. Patient not taking: Reported on 11/23/2022 11/25/17   Armandina Stammer I, NP  atorvastatin (LIPITOR) 10 MG tablet Take 1 tablet (10 mg total) by mouth daily. 11/13/22 12/13/22  Zigmund Daniel., MD  Blood Glucose Monitoring Suppl (ONETOUCH VERIO FLEX SYSTEM) w/Device KIT Use 3 (three) times daily. 11/13/22   Zigmund Daniel., MD  buPROPion (WELLBUTRIN) 75 MG tablet Take 3 tablets (225 mg total) by mouth 2 (two) times daily. 11/13/22 01/12/23  Zigmund Daniel., MD  Dulaglutide 4.5 MG/0.5ML SOPN Inject 4.5 mg into the skin once a week. Every Tuesday 12/09/21   [provider]  folic acid (FOLVITE) 1 MG tablet Take 1 tablet (1 mg total) by mouth  daily. 11/13/22 01/12/23  Zigmund Daniel., MD  gabapentin (NEURONTIN) 100 MG capsule Take 1 capsule (100 mg total) by mouth at bedtime. 11/13/22 01/12/23  Zigmund Daniel., MD  Glucose Blood (BLOOD GLUCOSE TEST STRIPS) STRP Use as directed to check blood sugar 3 (three) times daily. 11/13/22   Zigmund Daniel., MD  insulin aspart (NOVOLOG) 100 UNIT/ML FlexPen Inject 0-9 Units into the skin 3 (three) times daily with meals. CBG < 70: treat for low blood sugar CBG 121 - 150: 0 units           CBG 151 - 200: 1 unit CBG 201 - 250: 2 units           CBG 251 - 300: 3 units  CBG 301 - 350: 5 units           CBG 351 - 400: 7 units CBG > 400: 9 units and call MD 11/13/22   Zigmund Daniel., MD  Insulin Glargine [facility]) 100 UNIT/ML Inject 7 Units into the skin 2 (two) times daily. 11/13/22   Zigmund Daniel., MD  Insulin Pen Needle 32G X 4 MM MISC Use 3 (three) times daily. 11/13/22   Zigmund Daniel., MD  labetalol (NORMODYNE) 300 MG tablet Take 1 tablet (300 mg total) by mouth 2 (two) times daily. 11/13/22 01/12/23  Zigmund Daniel., MD  Lancet Device MISC 1 each by Does not apply route 3 (three) times daily. May dispense any manufacturer covered by patient's insurance. 11/13/22   Zigmund Daniel., MD  losartan (COZAAR) 100 MG tablet Take 1 tablet (100 mg total) by mouth daily. 11/13/22 12/13/22  Zigmund Daniel., MD  magnesium oxide (MAG-OX) 400 MG tablet Take 1 tablet (400 mg total) by mouth daily. 11/13/22 03/13/23  Zigmund Daniel., MD  montelukast (SINGULAIR) 10 MG tablet Take 10 mg by mouth at bedtime.    [provider]  mycophenolate (MYFORTIC) 180 MG EC tablet Take 4 tablets (720 mg total) by mouth 2 (two) times daily. Follow with your kidney doctor for refills 11/13/22 12/13/22  Zigmund Daniel., MD  naloxone [facility]) nasal spray 4 mg/0.1 mL Place 1 spray into the nose as needed. 10/28/21   [provider]  ondansetron  (ZOFRAN-ODT) 4 MG disintegrating tablet Take 4 mg by mouth every 8 (eight) hours as needed for vomiting or nausea. 11/20/22   [provider]  Oxycodone HCl 10 MG TABS Take 1 tablet by mouth every 4 (four) hours as needed (pain). 11/17/22 12/17/22  [provider]  pantoprazole (PROTONIX) 40 MG tablet Take 1 tablet (40 mg total) by mouth daily. For acid reflux 11/26/17   Armandina Stammer I, NP  predniSONE (DELTASONE) 5 MG tablet Take 1 tablet (5 mg total) by mouth daily with breakfast. Follow with your kidney doctor for refills 11/13/22 12/13/22  Zigmund Daniel., MD  spironolactone (ALDACTONE) 50 MG tablet Take 1 tablet (50 mg total) by mouth 2 (two) times daily. 11/26/22 12/26/22  Rolly Salter, MD  sulfamethoxazole-trimethoprim (BACTRIM) 400-80 MG tablet Take 1 tablet by mouth every Monday, Wednesday, and Friday. For infection 11/15/22 01/10/23  Zigmund Daniel., MD  tacrolimus (PROGRAF) 1 MG capsule Take 4 capsules (4 mg total) by mouth 2 (two) times daily. Follow with your kidney doctor for refills 11/13/22 12/13/22  Zigmund Daniel., MD      Allergies    Ciprofloxacin, Fluocinolone, Hydromorphone, Strawberry extract, Hydromorphone hcl, Phenytoin sodium extended, Adhesive [tape], Chlorhexidine gluconate, Clindamycin, Pork-derived products, and Shrimp [shellfish allergy]    Review of Systems   Review of Systems  Physical Exam Updated Vital Signs There were no vitals taken for this visit. Physical Exam Vitals and nursing note reviewed.  Constitutional:      General: She is not in acute distress.    Appearance: She is well-developed. She is ill-appearing. She is not diaphoretic.  HENT:     Head: Normocephalic and atraumatic.  Eyes:     Pupils: Pupils are equal, round, and reactive to light.  Cardiovascular:     Heart sounds: No murmur heard.    No friction rub. No gallop.  Pulmonary:     Effort: Pulmonary effort is normal.     Breath sounds: No wheezing or rales.   Abdominal:     General: There is no distension.     Palpations: Abdomen is soft.     Tenderness: There is no abdominal tenderness.  Musculoskeletal:        General: No tenderness.     Cervical back: Normal range of motion and neck supple.     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  Procedures Procedure Name: Intubation Date/Time: 11/17/2022 12:36 PM  Performed by: Melene Plan, DOPre-anesthesia Checklist: Patient identified, Patient being monitored, Emergency Drugs available, Timeout performed and Suction available Oxygen Delivery Method: Non-rebreather mask Preoxygenation: Pre-oxygenation with 100% oxygen Ventilation: Mask ventilation without difficulty Laryngoscope Size: Glidescope Grade View: Grade I Tube size: 7.5 mm Number of attempts: 1 Airway Equipment and Method: Video-laryngoscopy Placement Confirmation: ETT inserted through vocal cords under direct vision, CO2 detector and Breath sounds checked- equal and bilateral Tube secured with: ETT holder Dental Injury: Teeth and Oropharynx as per pre-operative assessment  Difficulty Due To: Difficulty was anticipated Future Recommendations: Recommend- induction with short-acting agent, and alternative techniques readily available    .Central Line  Date/Time: 11/04/2022 12:36 PM  Performed by: Melene Plan, DO Authorized by: Melene Plan, DO    Consent:    Consent obtained:  Emergent situation Universal protocol:    Immediately prior to procedure, a time out was called: yes     Patient identity confirmed:  Arm band Pre-procedure details:    Indication(s): central venous access     Hand hygiene: Hand hygiene performed prior to insertion     Sterile barrier technique: All elements of maximal sterile technique followed     Skin preparation:  Chlorhexidine Sedation:    Sedation type:  None Anesthesia:    Anesthesia method:  None Procedure details:    Location:  R femoral   Site selection rationale:  Cpr ongoing   Patient position:  Supine   Procedural supplies:  Triple lumen   Catheter size:  8.5 Fr   Landmarks identified: yes     Ultrasound guidance: yes     Ultrasound guidance timing: real time     Sterile ultrasound techniques: Sterile gel and sterile probe covers were used     Number of attempts:  1   Successful placement: yes   Post-procedure details:    Post-procedure:  Dressing applied and line sutured   Assessment:  Blood return through all ports   Procedure completion:  Tolerated well, no immediate complications ARTERIAL LINE  Date/Time: 11/20/2022 12:37 PM  Performed by: Melene Plan, DO Authorized by: Melene Plan, DO   Consent:    Consent obtained:  Emergent situation Universal protocol:    Immediately prior to procedure, a time out was called: yes     Patient identity confirmed:  Arm band Indications:    Indications: hemodynamic monitoring   Pre-procedure details:    Skin preparation:  Chlorhexidine   Preparation: Patient was prepped and draped in sterile fashion   Sedation:    Sedation type:  None Anesthesia:    Anesthesia method:  None Procedure details:    Location:  R femoral   Allen's test performed: no     Needle gauge:  18 G   Placement technique:  Ultrasound guided   Number of attempts:  1   Transducer: waveform confirmed   Post-procedure details:  Post-procedure:  Sterile dressing  applied   CMS:  Unchanged   Procedure completion:  Tolerated well, no immediate complications .Critical Care  Performed by: Melene Plan, DO Authorized by: Melene Plan, DO   Critical care provider statement:    Critical care time (minutes):  105   Critical care time was exclusive of:  Separately billable procedures and treating other patients   Critical care was time spent personally by me on the following activities:  Development of treatment plan with patient or surrogate, discussions with consultants, evaluation of patient's response to treatment, examination of patient, ordering and review of laboratory studies, ordering and review of radiographic studies, ordering and performing treatments and interventions, pulse oximetry, re-evaluation of patient's condition and review of old charts   Care discussed with: admitting provider      EMERGENCY DEPARTMENT [place] CARDIAC EXAM "Study: Limited Ultrasound of the Heart and Pericardium"  INDICATIONS:Cardiac arrest Multiple views of the heart and pericardium were obtained in real-time with a multi-frequency probe.  PERFORMED OZ:DGUYQI IMAGES ARCHIVED?: Yes LIMITATIONS:  Body habitus and Emergent procedure VIEWS USED: Subcostal 4 chamber INTERPRETATION: Cardiac activity present, Pericardial effusioin absent, Cardiac tamponade absent, Decreased contractility, IVC dilated, and likely volume overloaded      Medications Ordered in ED Medications - No data to display  ED Course/ Medical Decision Making/ A&P                                 Medical Decision Making  47 yo F with a chief complaint of cardiac arrest.  This was witnessed in front of EMS and at 1 point in the Emergency Department we did experience return of spontaneous circulation.  After which she was intubated and central access was obtained.  She unfortunately had a recurrence of cardiac arrest.  Asystole each time.  At 1 point she did go into a wide-complex tachycardia but had a pulse  and responded to amiodarone.  She is given multiple bolus doses of epinephrine.  Was given bicarb and calcium.  She seemed to respond better to calcium than anything else, her potassium was normal on an i-STAT and her renal function appeared to be just mildly worse than baseline.  She was on a vasopressin and Levophed and epinephrine infusion.  Eventually after over an hour and 20 minutes of CPR in the emergency department the code was called at 1206 with family at bedside  I discussed the case with the medical examiner who will treat this is a ME case.  The patients results and plan were reviewed and discussed.   Any x-rays performed were independently reviewed by myself.   Differential diagnosis were considered with the presenting HPI.  Medications - No data to display  There were no vitals filed for this visit.  Final diagnoses:  Cardiac arrest [facility])           Final Clinical Impression(s) / ED Diagnoses Final diagnoses:  Cardiac arrest [facility])    Rx / DC Orders ED Discharge Orders     None         Melene Plan, DO 11/16/2022 1241

## 2022-12-05 NOTE — ED Notes (Signed)
1102- epi given  1104- pulse check- asystole- CPR started  1105- Bicarb given and epi  1106- pulse check- asystole- no pulse, CPR started  1107- calcium given 1108- Epi  1108- pulse check- sinus tach  1110- epi given/ lost pulses/ CPR started  1112- pulse check/ sinus tach  1112- calcium given  1113- levo started at 15 mcgs  1114- CPR started   1115- epi given  1116- pulse check- asystole- CPR started

## 2022-12-05 NOTE — ED Triage Notes (Signed)
Pt called out for seizures, pt was sitting on bed axox4 and coded in front of EMS. Pt has extensive medical hx. BS 377 with EMS. CPR started at 1028. Pt in PEA. 1st epi was given at 1056. 6 total EPI's given with EMS.

## 2022-12-05 NOTE — ED Notes (Signed)
1140- pulse check- asystole- compressions resumed  1141- vaso started 0.04/ epi given  1142- pulse check- asystole- resumed compressions  1144- pulse check- asystole- resumed compressions  1145- epi given  1146- pulse check- sinus tach  1148- pulse check  1150- epi started at 15  1150- CPR- pulse check- compressions started  1151- epi given  1152- pulse check- resumed compressions  1154- pulse check- asystole- resumed compressions  1154- epi given  1156- pulse check- epi given- compressions started  1158- no pulse/ CPR started  1201- pulse check/ compressions resumed  1205- family brought to bedside  1206- TOD

## 2022-12-05 NOTE — ED Notes (Signed)
ME at bedside

## 2022-12-05 DEATH — deceased

## 2022-12-18 ENCOUNTER — Telehealth: Payer: Self-pay | Admitting: *Deleted

## 2022-12-18 NOTE — Telephone Encounter (Signed)
Received phone call from patient's mother requesting information regarding pick up of hospital bed and wheelchair that is no longer in use. Per patient's mother confirms patient receiving HH services through [facility]. Contact information for [facility] provided [phone].
